# Patient Record
Sex: Female | Born: 1970 | Race: White | Hispanic: No | Marital: Single | Smoking: Current every day smoker
Health system: Southern US, Community
[De-identification: ages and names within clinical notes are randomized; demographics above are authoritative.]

## PROBLEM LIST (undated history)

## (undated) DIAGNOSIS — M47816 Spondylosis without myelopathy or radiculopathy, lumbar region: Secondary | ICD-10-CM

## (undated) DIAGNOSIS — G43909 Migraine, unspecified, not intractable, without status migrainosus: Secondary | ICD-10-CM

## (undated) DIAGNOSIS — Q403 Congenital malformation of stomach, unspecified: Secondary | ICD-10-CM

## (undated) DIAGNOSIS — J40 Bronchitis, not specified as acute or chronic: Secondary | ICD-10-CM

## (undated) DIAGNOSIS — K8050 Calculus of bile duct without cholangitis or cholecystitis without obstruction: Secondary | ICD-10-CM

## (undated) DIAGNOSIS — K760 Fatty (change of) liver, not elsewhere classified: Secondary | ICD-10-CM

## (undated) DIAGNOSIS — A09 Infectious gastroenteritis and colitis, unspecified: Secondary | ICD-10-CM

## (undated) DIAGNOSIS — I209 Angina pectoris, unspecified: Secondary | ICD-10-CM

## (undated) DIAGNOSIS — M4726 Other spondylosis with radiculopathy, lumbar region: Secondary | ICD-10-CM

## (undated) DIAGNOSIS — M51369 Other intervertebral disc degeneration, lumbar region without mention of lumbar back pain or lower extremity pain: Secondary | ICD-10-CM

## (undated) DIAGNOSIS — I4510 Unspecified right bundle-branch block: Secondary | ICD-10-CM

## (undated) DIAGNOSIS — Z720 Tobacco use: Secondary | ICD-10-CM

## (undated) DIAGNOSIS — M67431 Ganglion, right wrist: Secondary | ICD-10-CM

## (undated) DIAGNOSIS — I2510 Atherosclerotic heart disease of native coronary artery without angina pectoris: Secondary | ICD-10-CM

## (undated) DIAGNOSIS — R32 Unspecified urinary incontinence: Secondary | ICD-10-CM

## (undated) DIAGNOSIS — K588 Other irritable bowel syndrome: Secondary | ICD-10-CM

## (undated) DIAGNOSIS — M5136 Other intervertebral disc degeneration, lumbar region: Secondary | ICD-10-CM

## (undated) DIAGNOSIS — G5601 Carpal tunnel syndrome, right upper limb: Secondary | ICD-10-CM

## (undated) DIAGNOSIS — G8929 Other chronic pain: Secondary | ICD-10-CM

## (undated) DIAGNOSIS — K8020 Calculus of gallbladder without cholecystitis without obstruction: Secondary | ICD-10-CM

## (undated) DIAGNOSIS — S92902A Unspecified fracture of left foot, initial encounter for closed fracture: Secondary | ICD-10-CM

## (undated) DIAGNOSIS — J449 Chronic obstructive pulmonary disease, unspecified: Secondary | ICD-10-CM

## (undated) DIAGNOSIS — M1990 Unspecified osteoarthritis, unspecified site: Secondary | ICD-10-CM

## (undated) DIAGNOSIS — R1901 Right upper quadrant abdominal swelling, mass and lump: Secondary | ICD-10-CM

## (undated) DIAGNOSIS — J439 Emphysema, unspecified: Secondary | ICD-10-CM

## (undated) DIAGNOSIS — G5602 Carpal tunnel syndrome, left upper limb: Secondary | ICD-10-CM

## (undated) DIAGNOSIS — C189 Malignant neoplasm of colon, unspecified: Secondary | ICD-10-CM

## (undated) DIAGNOSIS — G2581 Restless legs syndrome: Secondary | ICD-10-CM

## (undated) DIAGNOSIS — K589 Irritable bowel syndrome without diarrhea: Secondary | ICD-10-CM

## (undated) DIAGNOSIS — R1310 Dysphagia, unspecified: Secondary | ICD-10-CM

## (undated) DIAGNOSIS — M5481 Occipital neuralgia: Secondary | ICD-10-CM

## (undated) DIAGNOSIS — E032 Hypothyroidism due to medicaments and other exogenous substances: Secondary | ICD-10-CM

## (undated) DIAGNOSIS — M533 Sacrococcygeal disorders, not elsewhere classified: Secondary | ICD-10-CM

## (undated) DIAGNOSIS — F419 Anxiety disorder, unspecified: Secondary | ICD-10-CM

## (undated) DIAGNOSIS — K219 Gastro-esophageal reflux disease without esophagitis: Secondary | ICD-10-CM

## (undated) DIAGNOSIS — E039 Hypothyroidism, unspecified: Secondary | ICD-10-CM

## (undated) DIAGNOSIS — Z79891 Long term (current) use of opiate analgesic: Secondary | ICD-10-CM

## (undated) DIAGNOSIS — C169 Malignant neoplasm of stomach, unspecified: Secondary | ICD-10-CM

## (undated) DIAGNOSIS — J45909 Unspecified asthma, uncomplicated: Secondary | ICD-10-CM

## (undated) DIAGNOSIS — T7840XA Allergy, unspecified, initial encounter: Secondary | ICD-10-CM

## (undated) DIAGNOSIS — M169 Osteoarthritis of hip, unspecified: Secondary | ICD-10-CM

## (undated) DIAGNOSIS — R0600 Dyspnea, unspecified: Secondary | ICD-10-CM

## (undated) DIAGNOSIS — M545 Low back pain, unspecified: Secondary | ICD-10-CM

## (undated) DIAGNOSIS — D126 Benign neoplasm of colon, unspecified: Secondary | ICD-10-CM

## (undated) DIAGNOSIS — I499 Cardiac arrhythmia, unspecified: Secondary | ICD-10-CM

## (undated) DIAGNOSIS — N281 Cyst of kidney, acquired: Secondary | ICD-10-CM

## (undated) DIAGNOSIS — E785 Hyperlipidemia, unspecified: Secondary | ICD-10-CM

## (undated) DIAGNOSIS — G609 Hereditary and idiopathic neuropathy, unspecified: Secondary | ICD-10-CM

## (undated) DIAGNOSIS — C73 Malignant neoplasm of thyroid gland: Secondary | ICD-10-CM

## (undated) HISTORY — PX: STOMACH SURGERY: SHX791

## (undated) HISTORY — DX: Fatty (change of) liver, not elsewhere classified: K76.0

## (undated) HISTORY — DX: Unspecified osteoarthritis, unspecified site: M19.90

## (undated) HISTORY — PX: TOTAL ABDOMINAL HYSTERECTOMY W/ BILATERAL SALPINGOOPHORECTOMY: SHX83

## (undated) HISTORY — DX: Other irritable bowel syndrome: K58.8

## (undated) HISTORY — DX: Carpal tunnel syndrome, left upper limb: G56.02

## (undated) HISTORY — DX: Chronic obstructive pulmonary disease, unspecified: J44.9

## (undated) HISTORY — DX: Unspecified fracture of left foot, initial encounter for closed fracture: S92.902A

## (undated) HISTORY — DX: Cyst of kidney, acquired: N28.1

## (undated) HISTORY — DX: Congenital malformation of stomach, unspecified: Q40.3

## (undated) HISTORY — DX: Hereditary and idiopathic neuropathy, unspecified: G60.9

## (undated) HISTORY — DX: Other spondylosis with radiculopathy, lumbar region: M47.26

## (undated) HISTORY — DX: Osteoarthritis of hip, unspecified: M16.9

## (undated) HISTORY — DX: Emphysema, unspecified: J43.9

## (undated) HISTORY — PX: BLADDER SURGERY: SHX569

## (undated) HISTORY — DX: Sacrococcygeal disorders, not elsewhere classified: M53.3

## (undated) HISTORY — DX: Allergy, unspecified, initial encounter: T78.40XA

## (undated) HISTORY — DX: Unspecified urinary incontinence: R32

## (undated) HISTORY — DX: Infectious gastroenteritis and colitis, unspecified: A09

## (undated) HISTORY — DX: Other chronic pain: G89.29

## (undated) HISTORY — PX: ABDOMINAL HYSTERECTOMY: SHX81

## (undated) HISTORY — DX: Bronchitis, not specified as acute or chronic: J40

## (undated) HISTORY — DX: Gastro-esophageal reflux disease without esophagitis: K21.9

## (undated) HISTORY — DX: Spondylosis without myelopathy or radiculopathy, lumbar region: M47.816

## (undated) HISTORY — DX: Migraine, unspecified, not intractable, without status migrainosus: G43.909

## (undated) HISTORY — DX: Unspecified asthma, uncomplicated: J45.909

## (undated) HISTORY — DX: Other intervertebral disc degeneration, lumbar region: M51.36

## (undated) HISTORY — DX: Malignant neoplasm of colon, unspecified: C18.9

## (undated) HISTORY — DX: Malignant neoplasm of stomach, unspecified: C16.9

## (undated) HISTORY — PX: APPENDECTOMY: SHX54

---

## 2002-02-17 ENCOUNTER — Encounter: Admission: RE | Admit: 2002-02-17 | Discharge: 2002-02-17 | Payer: Self-pay | Admitting: Internal Medicine

## 2002-02-17 ENCOUNTER — Encounter: Payer: Self-pay | Admitting: Internal Medicine

## 2004-03-10 ENCOUNTER — Emergency Department: Payer: Self-pay | Admitting: Emergency Medicine

## 2004-11-30 ENCOUNTER — Emergency Department: Payer: Self-pay | Admitting: Emergency Medicine

## 2005-09-29 ENCOUNTER — Ambulatory Visit: Payer: Self-pay | Admitting: Unknown Physician Specialty

## 2005-10-01 ENCOUNTER — Ambulatory Visit: Payer: Self-pay | Admitting: Infectious Diseases

## 2005-10-03 ENCOUNTER — Ambulatory Visit: Payer: Self-pay | Admitting: Infectious Diseases

## 2005-10-07 ENCOUNTER — Encounter: Admission: RE | Admit: 2005-10-07 | Discharge: 2005-10-07 | Payer: Self-pay | Admitting: *Deleted

## 2006-01-23 ENCOUNTER — Emergency Department: Payer: Self-pay

## 2006-04-27 ENCOUNTER — Ambulatory Visit: Payer: Self-pay | Admitting: Urology

## 2006-05-03 ENCOUNTER — Emergency Department: Payer: Self-pay | Admitting: Emergency Medicine

## 2006-09-19 ENCOUNTER — Emergency Department: Payer: Self-pay | Admitting: Emergency Medicine

## 2006-11-17 ENCOUNTER — Ambulatory Visit (HOSPITAL_BASED_OUTPATIENT_CLINIC_OR_DEPARTMENT_OTHER): Admission: RE | Admit: 2006-11-17 | Discharge: 2006-11-17 | Payer: Self-pay | Admitting: Urology

## 2007-03-22 ENCOUNTER — Emergency Department: Payer: Self-pay | Admitting: Unknown Physician Specialty

## 2007-06-14 ENCOUNTER — Emergency Department: Payer: Self-pay | Admitting: Emergency Medicine

## 2007-07-21 ENCOUNTER — Ambulatory Visit: Payer: Self-pay | Admitting: Gastroenterology

## 2007-12-17 ENCOUNTER — Other Ambulatory Visit: Payer: Self-pay

## 2007-12-17 ENCOUNTER — Emergency Department: Payer: Self-pay | Admitting: Emergency Medicine

## 2008-02-23 ENCOUNTER — Emergency Department: Payer: Self-pay | Admitting: Emergency Medicine

## 2008-02-23 ENCOUNTER — Other Ambulatory Visit: Payer: Self-pay

## 2008-03-13 ENCOUNTER — Ambulatory Visit: Payer: Self-pay | Admitting: Unknown Physician Specialty

## 2008-03-16 ENCOUNTER — Ambulatory Visit: Payer: Self-pay | Admitting: Unknown Physician Specialty

## 2008-03-22 ENCOUNTER — Ambulatory Visit: Payer: Self-pay | Admitting: Unknown Physician Specialty

## 2008-04-25 ENCOUNTER — Emergency Department: Payer: Self-pay | Admitting: Emergency Medicine

## 2008-06-11 ENCOUNTER — Emergency Department: Payer: Self-pay | Admitting: Emergency Medicine

## 2008-10-09 ENCOUNTER — Ambulatory Visit: Payer: Self-pay | Admitting: Pain Medicine

## 2008-10-16 ENCOUNTER — Ambulatory Visit: Payer: Self-pay | Admitting: Pain Medicine

## 2008-10-20 ENCOUNTER — Ambulatory Visit: Payer: Self-pay | Admitting: Unknown Physician Specialty

## 2008-10-26 ENCOUNTER — Ambulatory Visit: Payer: Self-pay | Admitting: Unknown Physician Specialty

## 2008-11-13 ENCOUNTER — Ambulatory Visit: Payer: Self-pay | Admitting: Pain Medicine

## 2008-11-16 ENCOUNTER — Ambulatory Visit: Payer: Self-pay | Admitting: Pain Medicine

## 2008-11-22 ENCOUNTER — Ambulatory Visit: Payer: Self-pay | Admitting: Pain Medicine

## 2008-12-12 ENCOUNTER — Ambulatory Visit: Payer: Self-pay | Admitting: Pain Medicine

## 2008-12-20 ENCOUNTER — Ambulatory Visit: Payer: Self-pay | Admitting: Pain Medicine

## 2009-01-31 ENCOUNTER — Ambulatory Visit: Payer: Self-pay | Admitting: Pain Medicine

## 2009-02-05 ENCOUNTER — Ambulatory Visit: Payer: Self-pay | Admitting: Pain Medicine

## 2009-02-17 ENCOUNTER — Emergency Department: Payer: Self-pay | Admitting: Emergency Medicine

## 2009-03-06 ENCOUNTER — Ambulatory Visit: Payer: Self-pay | Admitting: Pain Medicine

## 2009-04-30 ENCOUNTER — Ambulatory Visit: Payer: Self-pay | Admitting: Pain Medicine

## 2009-05-07 ENCOUNTER — Ambulatory Visit: Payer: Self-pay | Admitting: Pain Medicine

## 2009-05-23 ENCOUNTER — Ambulatory Visit: Payer: Self-pay | Admitting: Pain Medicine

## 2009-06-06 ENCOUNTER — Ambulatory Visit: Payer: Self-pay | Admitting: Pain Medicine

## 2009-06-26 ENCOUNTER — Ambulatory Visit: Payer: Self-pay | Admitting: Pain Medicine

## 2009-07-04 ENCOUNTER — Ambulatory Visit: Payer: Self-pay | Admitting: Pain Medicine

## 2009-07-26 ENCOUNTER — Ambulatory Visit: Payer: Self-pay | Admitting: Pain Medicine

## 2009-08-23 ENCOUNTER — Ambulatory Visit: Payer: Self-pay | Admitting: Pain Medicine

## 2009-09-03 ENCOUNTER — Ambulatory Visit: Payer: Self-pay | Admitting: Pain Medicine

## 2009-09-20 ENCOUNTER — Ambulatory Visit: Payer: Self-pay | Admitting: Pain Medicine

## 2009-10-03 ENCOUNTER — Ambulatory Visit: Payer: Self-pay | Admitting: Pain Medicine

## 2009-10-23 ENCOUNTER — Ambulatory Visit: Payer: Self-pay | Admitting: Pain Medicine

## 2009-11-19 ENCOUNTER — Ambulatory Visit: Payer: Self-pay | Admitting: Pain Medicine

## 2010-01-09 ENCOUNTER — Ambulatory Visit: Payer: Self-pay | Admitting: Pain Medicine

## 2010-01-14 ENCOUNTER — Ambulatory Visit: Payer: Self-pay | Admitting: Pain Medicine

## 2010-02-01 ENCOUNTER — Ambulatory Visit: Payer: Self-pay | Admitting: Unknown Physician Specialty

## 2010-02-07 ENCOUNTER — Ambulatory Visit: Payer: Self-pay | Admitting: Pain Medicine

## 2010-03-04 ENCOUNTER — Ambulatory Visit: Payer: Self-pay | Admitting: Pain Medicine

## 2010-04-08 ENCOUNTER — Ambulatory Visit: Payer: Self-pay | Admitting: Pain Medicine

## 2010-04-22 ENCOUNTER — Ambulatory Visit: Payer: Self-pay | Admitting: Pain Medicine

## 2010-05-05 ENCOUNTER — Emergency Department: Payer: Self-pay | Admitting: Emergency Medicine

## 2010-05-07 ENCOUNTER — Ambulatory Visit: Payer: Self-pay | Admitting: Pain Medicine

## 2010-06-04 ENCOUNTER — Ambulatory Visit: Payer: Self-pay | Admitting: Pain Medicine

## 2010-07-08 ENCOUNTER — Ambulatory Visit: Payer: Self-pay | Admitting: Pain Medicine

## 2010-08-06 ENCOUNTER — Ambulatory Visit: Payer: Self-pay | Admitting: Pain Medicine

## 2010-08-14 ENCOUNTER — Ambulatory Visit: Payer: Self-pay | Admitting: Pain Medicine

## 2010-09-03 ENCOUNTER — Ambulatory Visit: Payer: Self-pay | Admitting: Pain Medicine

## 2010-09-07 ENCOUNTER — Emergency Department: Payer: Self-pay | Admitting: Emergency Medicine

## 2010-10-03 ENCOUNTER — Ambulatory Visit: Payer: Self-pay | Admitting: Pain Medicine

## 2010-10-21 ENCOUNTER — Ambulatory Visit: Payer: Self-pay | Admitting: Pain Medicine

## 2010-10-22 NOTE — Op Note (Signed)
Janet Murray, Janet Murray                 ACCOUNT NO.:  [id number]   MEDICAL RECORD NO.:  [id number]          PATIENT TYPE:  AMB   LOCATION:  [facility]                         FACILITY:  [facility]   PHYSICIAN:  Martina Sinner, MD DATE OF BIRTH:  1970/07/14   DATE OF PROCEDURE:  DATE OF DISCHARGE:                               OPERATIVE REPORT   Audio too short to transcribe (less than 5 seconds)           ______________________________  Martina Sinner, MD     SAM/MEDQ  D:  11/17/2006  T:  11/17/2006  Job:  [id number]

## 2010-10-22 NOTE — Op Note (Signed)
NAMEERTHA, NABOR                 ACCOUNT NO.:  [id number]   MEDICAL RECORD NO.:  [id number]          PATIENT TYPE:  AMB   LOCATION:  [facility]                         FACILITY:  [facility]   PHYSICIAN:  Martina Sinner, MD DATE OF BIRTH:  1970-11-07   DATE OF PROCEDURE:  11/17/2006  DATE OF DISCHARGE:                               OPERATIVE REPORT   PREOPERATIVE DIAGNOSIS:  Pelvic pain.   POSTOPERATIVE DIAGNOSIS:  Pelvic pain.   OPERATION/PROCEDURE:  Cystoscopy, bladder hydrodistention and bladder  instillation therapy.   INDICATIONS:  Mrs. Cotta  has mixed stress urge incontinence.  She has  suprapubic pressure.  She has frequency and right lower quadrant pain.  She had urodynamics that demonstrated a lot of bladder overactivity and  a mild outlet abnormality.  She voids frequently because of pressure  that is relieved she voids.  She had dysfunctional voiding and pain on  urodynamics.   Today she underwent cystoscopy, hydrodistention.  A 21-French scope was  used for examination.  Initially the bladder mucosa and trigone were  normal.  There is no stitch, foreign body, or carcinoma.  I  hydrodistended her to 900 mL on two occasions.  I emptied her bladder,  looked twice.  She had a few glomerulations at 5 and 7 o'clock, but they  were very minimal.  Overall I thought the test was negative or equivocal  to support the diagnosis of interstitial cystitis.   My gut feeling is that Toniann Fail likely does have interstitial cystitis.  I  may do a potassium sensitivity test in the future.  I likely will start  her on bladder instillation therapy to see if this helps her symptoms.   She failed Enablex and Vesicare.  She could not travel to see Lenna Gilford.  I  will talk to her about this since I think Lenna Gilford  could help her a lot.  I am concerned if she ever had a sling for her a mild stress  incontinence that her pressure and frequency and urge incontinence and  bed waiting will continue.  I think  we have to treat her pain syndrome  as well as overactive bladder.  She may be a good candidate for an  InterStim otitis media the future.   I will ask her about her stress incontinence again and its severity when  she comes back to clinic.  Her leak point pressure was very high during  her urodynamics.           ______________________________  Martina Sinner, MD  Electronically Signed     SAM/MEDQ  D:  11/17/2006  T:  11/18/2006  Job:  [id number]

## 2010-12-03 ENCOUNTER — Emergency Department: Payer: Self-pay | Admitting: Emergency Medicine

## 2011-02-10 ENCOUNTER — Emergency Department: Payer: Self-pay | Admitting: Emergency Medicine

## 2011-03-27 LAB — POCT PREGNANCY, URINE: Preg Test, Ur: NEGATIVE

## 2011-05-08 ENCOUNTER — Ambulatory Visit: Payer: Self-pay | Admitting: Physician Assistant

## 2011-06-07 ENCOUNTER — Emergency Department: Payer: Self-pay | Admitting: Emergency Medicine

## 2011-06-30 ENCOUNTER — Emergency Department: Payer: Self-pay | Admitting: *Deleted

## 2011-08-05 ENCOUNTER — Ambulatory Visit: Payer: Self-pay | Admitting: Physician Assistant

## 2011-08-28 ENCOUNTER — Ambulatory Visit: Payer: Self-pay | Admitting: Pain Medicine

## 2011-09-03 ENCOUNTER — Emergency Department: Payer: Self-pay | Admitting: Emergency Medicine

## 2011-09-03 LAB — CBC WITH DIFFERENTIAL/PLATELET
Basophil #: 0 10*3/uL (ref 0.0–0.1)
Lymphocyte #: 1 10*3/uL (ref 1.0–3.6)
MCH: 33.4 pg (ref 26.0–34.0)
MCHC: 34 g/dL (ref 32.0–36.0)
MCV: 98 fL (ref 80–100)
Neutrophil #: 6 10*3/uL (ref 1.4–6.5)
Platelet: 188 10*3/uL (ref 150–440)

## 2011-09-03 LAB — COMPREHENSIVE METABOLIC PANEL
Alkaline Phosphatase: 81 U/L (ref 50–136)
Bilirubin,Total: 0.3 mg/dL (ref 0.2–1.0)
Calcium, Total: 8.6 mg/dL (ref 8.5–10.1)
Co2: 24 mmol/L (ref 21–32)
EGFR (African American): >60
Glucose: 107 mg/dL — ABNORMAL HIGH (ref 65–99)
SGOT(AST): 17 U/L (ref 15–37)
Sodium: 140 mmol/L (ref 136–145)

## 2011-09-03 LAB — URINALYSIS, COMPLETE
Bacteria: NONE SEEN
Bilirubin,UR: NEGATIVE
Glucose,UR: NEGATIVE mg/dL (ref 0–75)
Ketone: NEGATIVE
Leukocyte Esterase: NEGATIVE
RBC,UR: 11 /HPF (ref 0–5)

## 2011-09-08 ENCOUNTER — Ambulatory Visit: Payer: Self-pay | Admitting: Pain Medicine

## 2011-09-08 ENCOUNTER — Emergency Department: Payer: Self-pay | Admitting: Unknown Physician Specialty

## 2011-09-15 ENCOUNTER — Ambulatory Visit: Payer: Self-pay | Admitting: Pain Medicine

## 2011-10-14 ENCOUNTER — Ambulatory Visit: Payer: Self-pay | Admitting: Pain Medicine

## 2011-10-22 ENCOUNTER — Ambulatory Visit: Payer: Self-pay | Admitting: Pain Medicine

## 2011-11-11 ENCOUNTER — Ambulatory Visit: Payer: Self-pay | Admitting: Pain Medicine

## 2011-11-24 ENCOUNTER — Ambulatory Visit: Payer: Self-pay | Admitting: Pain Medicine

## 2011-12-04 ENCOUNTER — Ambulatory Visit: Payer: Self-pay | Admitting: Pain Medicine

## 2012-01-12 ENCOUNTER — Ambulatory Visit: Payer: Self-pay | Admitting: Pain Medicine

## 2012-01-21 ENCOUNTER — Ambulatory Visit: Payer: Self-pay | Admitting: Pain Medicine

## 2012-02-12 ENCOUNTER — Ambulatory Visit: Payer: Self-pay | Admitting: Pain Medicine

## 2012-02-25 ENCOUNTER — Ambulatory Visit: Payer: Self-pay | Admitting: Pain Medicine

## 2012-04-08 ENCOUNTER — Ambulatory Visit: Payer: Self-pay | Admitting: Pain Medicine

## 2012-04-12 ENCOUNTER — Ambulatory Visit: Payer: Self-pay | Admitting: Pain Medicine

## 2012-04-21 ENCOUNTER — Emergency Department: Payer: Self-pay | Admitting: Emergency Medicine

## 2012-05-11 ENCOUNTER — Ambulatory Visit: Payer: Self-pay | Admitting: Pain Medicine

## 2012-06-07 ENCOUNTER — Ambulatory Visit: Payer: Self-pay | Admitting: Pain Medicine

## 2012-07-19 ENCOUNTER — Ambulatory Visit: Payer: Self-pay | Admitting: Pain Medicine

## 2012-09-03 ENCOUNTER — Ambulatory Visit: Payer: Self-pay | Admitting: Internal Medicine

## 2012-09-06 ENCOUNTER — Ambulatory Visit: Payer: Self-pay | Admitting: Pain Medicine

## 2012-10-05 ENCOUNTER — Ambulatory Visit: Payer: Self-pay | Admitting: Pain Medicine

## 2012-10-20 ENCOUNTER — Ambulatory Visit: Payer: Self-pay | Admitting: Pain Medicine

## 2012-11-04 ENCOUNTER — Ambulatory Visit: Payer: Self-pay | Admitting: Pain Medicine

## 2012-11-17 ENCOUNTER — Ambulatory Visit: Payer: Self-pay | Admitting: Pain Medicine

## 2012-11-19 ENCOUNTER — Ambulatory Visit: Payer: Self-pay | Admitting: Pain Medicine

## 2012-11-30 ENCOUNTER — Ambulatory Visit: Payer: Self-pay | Admitting: Pain Medicine

## 2012-12-08 ENCOUNTER — Ambulatory Visit: Payer: Self-pay | Admitting: Pain Medicine

## 2012-12-30 ENCOUNTER — Ambulatory Visit: Payer: Self-pay | Admitting: Pain Medicine

## 2013-02-28 ENCOUNTER — Ambulatory Visit: Payer: Self-pay | Admitting: Pain Medicine

## 2013-03-29 ENCOUNTER — Ambulatory Visit: Payer: Self-pay | Admitting: Pain Medicine

## 2013-03-30 ENCOUNTER — Ambulatory Visit: Payer: Self-pay | Admitting: Pain Medicine

## 2013-04-26 ENCOUNTER — Ambulatory Visit: Payer: Self-pay | Admitting: Pain Medicine

## 2013-05-26 ENCOUNTER — Ambulatory Visit: Payer: Self-pay | Admitting: Pain Medicine

## 2013-06-15 ENCOUNTER — Ambulatory Visit: Payer: Self-pay | Admitting: Pain Medicine

## 2013-06-28 ENCOUNTER — Ambulatory Visit: Payer: Self-pay | Admitting: Pain Medicine

## 2013-07-04 ENCOUNTER — Ambulatory Visit: Payer: Self-pay | Admitting: Pain Medicine

## 2013-08-03 ENCOUNTER — Ambulatory Visit: Payer: Self-pay | Admitting: Pain Medicine

## 2013-09-01 ENCOUNTER — Ambulatory Visit: Payer: Self-pay | Admitting: Pain Medicine

## 2013-09-27 ENCOUNTER — Ambulatory Visit: Payer: Self-pay | Admitting: Pain Medicine

## 2013-09-28 ENCOUNTER — Ambulatory Visit: Payer: Self-pay | Admitting: Pain Medicine

## 2013-10-06 ENCOUNTER — Ambulatory Visit: Payer: Self-pay | Admitting: Pain Medicine

## 2013-10-12 ENCOUNTER — Ambulatory Visit: Payer: Self-pay | Admitting: Pain Medicine

## 2013-10-29 ENCOUNTER — Emergency Department: Payer: Self-pay | Admitting: Emergency Medicine

## 2013-10-29 LAB — URINALYSIS, COMPLETE
BILIRUBIN, UR: NEGATIVE
Glucose,UR: NEGATIVE mg/dL (ref 0–75)
Hyaline Cast: 2
Nitrite: NEGATIVE
Ph: 5 (ref 4.5–8.0)
Protein: 30
Specific Gravity: 1.026 (ref 1.003–1.030)
Squamous Epithelial: 8

## 2013-10-29 LAB — CBC WITH DIFFERENTIAL/PLATELET
BASOS PCT: 0.7 %
Basophil #: 0.1 10*3/uL (ref 0.0–0.1)
EOS ABS: 0.2 10*3/uL (ref 0.0–0.7)
Eosinophil %: 2 %
HCT: 43.9 % (ref 35.0–47.0)
HGB: 14.3 g/dL (ref 12.0–16.0)
Lymphocyte #: 2.9 10*3/uL (ref 1.0–3.6)
Lymphocyte %: 29.6 %
MCH: 32.2 pg (ref 26.0–34.0)
MCHC: 32.5 g/dL (ref 32.0–36.0)
MCV: 99 fL (ref 80–100)
MONOS PCT: 7.6 %
Monocyte #: 0.7 x10 3/mm (ref 0.2–0.9)
NEUTROS ABS: 5.8 10*3/uL (ref 1.4–6.5)
Neutrophil %: 60.1 %
PLATELETS: 173 10*3/uL (ref 150–440)
RBC: 4.42 10*6/uL (ref 3.80–5.20)
RDW: 13 % (ref 11.5–14.5)
WBC: 9.6 10*3/uL (ref 3.6–11.0)

## 2013-10-29 LAB — COMPREHENSIVE METABOLIC PANEL
ALBUMIN: 4.1 g/dL (ref 3.4–5.0)
ALK PHOS: 87 U/L
Anion Gap: 4 — ABNORMAL LOW (ref 7–16)
BILIRUBIN TOTAL: 0.3 mg/dL (ref 0.2–1.0)
BUN: 7 mg/dL (ref 7–18)
CALCIUM: 9.2 mg/dL (ref 8.5–10.1)
CO2: 28 mmol/L (ref 21–32)
Chloride: 106 mmol/L (ref 98–107)
Creatinine: 0.66 mg/dL (ref 0.60–1.30)
EGFR (African American): >60
EGFR (Non-African Amer.): >60
Glucose: 105 mg/dL — ABNORMAL HIGH (ref 65–99)
OSMOLALITY: 274 (ref 275–301)
Potassium: 3.2 mmol/L — ABNORMAL LOW (ref 3.5–5.1)
SGOT(AST): 17 U/L (ref 15–37)
SGPT (ALT): 13 U/L (ref 12–78)
Sodium: 138 mmol/L (ref 136–145)
Total Protein: 7.2 g/dL (ref 6.4–8.2)

## 2013-10-29 LAB — LIPASE, BLOOD: Lipase: 77 U/L (ref 73–393)

## 2013-11-01 ENCOUNTER — Ambulatory Visit: Payer: Self-pay | Admitting: Pain Medicine

## 2013-11-01 LAB — URINE CULTURE

## 2013-11-14 ENCOUNTER — Ambulatory Visit: Payer: Self-pay | Admitting: Pain Medicine

## 2013-11-29 ENCOUNTER — Ambulatory Visit: Payer: Self-pay | Admitting: Pain Medicine

## 2013-12-12 ENCOUNTER — Ambulatory Visit: Payer: Self-pay | Admitting: Pain Medicine

## 2013-12-16 ENCOUNTER — Emergency Department: Payer: Self-pay | Admitting: Emergency Medicine

## 2013-12-16 LAB — CBC
HCT: 40.3 % (ref 35.0–47.0)
HGB: 13.5 g/dL (ref 12.0–16.0)
MCH: 33.8 pg (ref 26.0–34.0)
MCHC: 33.4 g/dL (ref 32.0–36.0)
MCV: 101 fL — ABNORMAL HIGH (ref 80–100)
Platelet: 226 10*3/uL (ref 150–440)
RBC: 3.99 10*6/uL (ref 3.80–5.20)
RDW: 13.6 % (ref 11.5–14.5)
WBC: 10.3 10*3/uL (ref 3.6–11.0)

## 2013-12-16 LAB — BASIC METABOLIC PANEL
ANION GAP: 7 (ref 7–16)
BUN: 11 mg/dL (ref 7–18)
CHLORIDE: 107 mmol/L (ref 98–107)
CO2: 24 mmol/L (ref 21–32)
Calcium, Total: 8.7 mg/dL (ref 8.5–10.1)
Creatinine: 0.72 mg/dL (ref 0.60–1.30)
GLUCOSE: 110 mg/dL — AB (ref 65–99)
OSMOLALITY: 276 (ref 275–301)
POTASSIUM: 3.8 mmol/L (ref 3.5–5.1)
Sodium: 138 mmol/L (ref 136–145)

## 2013-12-16 LAB — PRO B NATRIURETIC PEPTIDE: B-Type Natriuretic Peptide: 20 pg/mL (ref 0–125)

## 2013-12-16 LAB — TROPONIN I: Troponin-I: <0.02 ng/mL

## 2014-01-10 ENCOUNTER — Ambulatory Visit: Payer: Self-pay | Admitting: Pain Medicine

## 2014-01-16 ENCOUNTER — Ambulatory Visit: Payer: Self-pay | Admitting: Pain Medicine

## 2014-02-09 ENCOUNTER — Ambulatory Visit: Payer: Self-pay | Admitting: Pain Medicine

## 2014-02-27 ENCOUNTER — Ambulatory Visit: Payer: Self-pay | Admitting: Pain Medicine

## 2014-03-09 ENCOUNTER — Ambulatory Visit: Payer: Self-pay | Admitting: Pain Medicine

## 2014-03-15 ENCOUNTER — Emergency Department: Payer: Self-pay | Admitting: Emergency Medicine

## 2014-03-15 LAB — CBC
HCT: 41.3 % (ref 35.0–47.0)
HGB: 13.4 g/dL (ref 12.0–16.0)
MCH: 33.5 pg (ref 26.0–34.0)
MCHC: 32.6 g/dL (ref 32.0–36.0)
MCV: 103 fL — AB (ref 80–100)
Platelet: 254 10*3/uL (ref 150–440)
RBC: 4.01 10*6/uL (ref 3.80–5.20)
RDW: 13 % (ref 11.5–14.5)
WBC: 6.1 10*3/uL (ref 3.6–11.0)

## 2014-03-15 LAB — BASIC METABOLIC PANEL
Anion Gap: 5 — ABNORMAL LOW (ref 7–16)
BUN: 6 mg/dL — ABNORMAL LOW (ref 7–18)
CALCIUM: 8.2 mg/dL — AB (ref 8.5–10.1)
CO2: 27 mmol/L (ref 21–32)
CREATININE: 0.52 mg/dL — AB (ref 0.60–1.30)
Chloride: 109 mmol/L — ABNORMAL HIGH (ref 98–107)
EGFR (Non-African Amer.): >60
Glucose: 88 mg/dL (ref 65–99)
OSMOLALITY: 278 (ref 275–301)
Potassium: 3.5 mmol/L (ref 3.5–5.1)
Sodium: 141 mmol/L (ref 136–145)

## 2014-03-15 LAB — TROPONIN I: Troponin-I: <0.02 ng/mL

## 2014-05-15 ENCOUNTER — Ambulatory Visit: Payer: Self-pay | Admitting: Internal Medicine

## 2014-05-22 ENCOUNTER — Ambulatory Visit: Payer: Self-pay | Admitting: Pain Medicine

## 2014-05-29 ENCOUNTER — Emergency Department: Payer: Self-pay | Admitting: Emergency Medicine

## 2014-06-20 ENCOUNTER — Ambulatory Visit: Payer: Self-pay | Admitting: Pain Medicine

## 2014-06-26 ENCOUNTER — Ambulatory Visit: Payer: Self-pay | Admitting: Pain Medicine

## 2014-07-18 ENCOUNTER — Emergency Department: Payer: Self-pay | Admitting: Internal Medicine

## 2014-07-25 ENCOUNTER — Ambulatory Visit: Payer: Self-pay | Admitting: Pain Medicine

## 2014-07-27 ENCOUNTER — Ambulatory Visit: Payer: Self-pay | Admitting: Pain Medicine

## 2014-07-31 ENCOUNTER — Emergency Department: Payer: Self-pay | Admitting: Emergency Medicine

## 2014-08-22 ENCOUNTER — Ambulatory Visit: Payer: Self-pay | Admitting: Pain Medicine

## 2014-09-04 ENCOUNTER — Ambulatory Visit: Payer: Self-pay | Admitting: Pain Medicine

## 2014-09-19 ENCOUNTER — Ambulatory Visit
Admit: 2014-09-19 | Disposition: A | Discharge status: Home / Self Care | Payer: Self-pay | Attending: Pain Medicine | Admitting: Pain Medicine

## 2014-09-19 ENCOUNTER — Ambulatory Visit: Admit: 2014-09-19 | Disposition: A | Discharge status: Home / Self Care | Payer: Self-pay | Admitting: Pain Medicine

## 2014-10-23 ENCOUNTER — Telehealth: Payer: Self-pay | Admitting: Pain Medicine

## 2014-10-23 NOTE — Telephone Encounter (Signed)
[phone] call back to this number / last visit 09-19-14 order put in for coollief rf / pt not sched for med refill/ please advise.

## 2014-10-23 NOTE — Telephone Encounter (Signed)
Medication refills to last until 10/21/2014. Patient is due for medication refill. Dr. Primus Bravo agreed to see patient for med refill on 10/30/14 or 11/01/2014 at 7 am

## 2014-10-25 ENCOUNTER — Encounter (INDEPENDENT_AMBULATORY_CARE_PROVIDER_SITE_OTHER): Payer: Self-pay

## 2014-10-25 ENCOUNTER — Encounter: Payer: Self-pay | Admitting: Pain Medicine

## 2014-10-25 ENCOUNTER — Other Ambulatory Visit: Payer: Self-pay | Admitting: Pain Medicine

## 2014-10-25 ENCOUNTER — Ambulatory Visit: Payer: Medicare Other | Attending: Pain Medicine | Admitting: Pain Medicine

## 2014-10-25 VITALS — BP 101/70 | HR 74 | Temp 98.2°F | Resp 16 | Ht 61.0 in | Wt 168.0 lb

## 2014-10-25 DIAGNOSIS — M47816 Spondylosis without myelopathy or radiculopathy, lumbar region: Secondary | ICD-10-CM

## 2014-10-25 DIAGNOSIS — M51369 Other intervertebral disc degeneration, lumbar region without mention of lumbar back pain or lower extremity pain: Secondary | ICD-10-CM

## 2014-10-25 DIAGNOSIS — M545 Low back pain, unspecified: Secondary | ICD-10-CM | POA: Insufficient documentation

## 2014-10-25 DIAGNOSIS — M533 Sacrococcygeal disorders, not elsewhere classified: Secondary | ICD-10-CM | POA: Insufficient documentation

## 2014-10-25 DIAGNOSIS — G5602 Carpal tunnel syndrome, left upper limb: Secondary | ICD-10-CM

## 2014-10-25 DIAGNOSIS — G2581 Restless legs syndrome: Secondary | ICD-10-CM | POA: Insufficient documentation

## 2014-10-25 DIAGNOSIS — M79604 Pain in right leg: Secondary | ICD-10-CM | POA: Diagnosis present

## 2014-10-25 DIAGNOSIS — M5136 Other intervertebral disc degeneration, lumbar region: Secondary | ICD-10-CM

## 2014-10-25 DIAGNOSIS — M79605 Pain in left leg: Secondary | ICD-10-CM | POA: Insufficient documentation

## 2014-10-25 DIAGNOSIS — M4726 Other spondylosis with radiculopathy, lumbar region: Secondary | ICD-10-CM

## 2014-10-25 DIAGNOSIS — M5481 Occipital neuralgia: Secondary | ICD-10-CM

## 2014-10-25 HISTORY — DX: Sacrococcygeal disorders, not elsewhere classified: M53.3

## 2014-10-25 HISTORY — DX: Other spondylosis with radiculopathy, lumbar region: M47.26

## 2014-10-25 HISTORY — DX: Carpal tunnel syndrome, left upper limb: G56.02

## 2014-10-25 HISTORY — DX: Other intervertebral disc degeneration, lumbar region: M51.36

## 2014-10-25 HISTORY — DX: Spondylosis without myelopathy or radiculopathy, lumbar region: M47.816

## 2014-10-25 HISTORY — DX: Other intervertebral disc degeneration, lumbar region without mention of lumbar back pain or lower extremity pain: M51.369

## 2014-10-25 MED ORDER — OXYCODONE-ACETAMINOPHEN 5-325 MG PO TABS: ORAL_TABLET | ORAL | Status: DC

## 2014-10-25 MED ORDER — TIZANIDINE HCL 2 MG PO CAPS: ORAL_CAPSULE | ORAL | Status: DC

## 2014-10-25 NOTE — Progress Notes (Signed)
   Subjective:    Patient ID: Janet Murray, female    DOB: [birth year], 44 y.o.   MRN: [id number]  HPI    Review of Systems     Objective:   Physical Exam        Assessment & Plan:

## 2014-10-25 NOTE — Progress Notes (Signed)
Ambulatory 1345  Teach back times 3  Discharged at 1345pm

## 2014-10-25 NOTE — Patient Instructions (Addendum)
Continue present medications.  Lumbar sympathetic  Block on  11/08/14  F/U PCP for evaliation of  BP and general medical  condition.  F/U surgical evaluation.. We will have secretary called Dr. Jerene Dilling office to request that he schedule neurosurgical evaluation of your lower back and lower extremity pain  F/U neurological evaluation.  May consider radiofrequency rhizolysis or intraspinal procedures pending response to present treatment and F/U evaluation.    GENERAL RISKS AND COMPLICATIONS  What are the risk, side effects and possible complications? Generally speaking, most procedures are safe.  However, with any procedure there are risks, side effects, and the possibility of complications.  The risks and complications are dependent upon the sites that are lesioned, or the type of nerve block to be performed.  The closer the procedure is to the spine, the more serious the risks are.  Great care is taken when placing the radio frequency needles, block needles or lesioning probes, but sometimes complications can occur. 1. Infection: Any time there is an injection through the skin, there is a risk of infection.  This is why sterile conditions are used for these blocks.  There are four possible types of infection. 1. Localized skin infection. 2. Central Nervous System Infection-This can be in the form of Meningitis, which can be deadly. 3. Epidural Infections-This can be in the form of an epidural abscess, which can cause pressure inside of the spine, causing compression of the spinal cord with subsequent paralysis. This would require an emergency surgery to decompress, and there are no guarantees that the patient would recover from the paralysis. 4. Discitis-This is an infection of the intervertebral discs.  It occurs in about 1% of discography procedures.  It is difficult to treat and it may lead to surgery.        2. Pain: the needles have to go through skin and soft tissues, will cause  soreness.       3. Damage to internal structures:  The nerves to be lesioned may be near blood vessels or    other nerves which can be potentially damaged.       4. Bleeding: Bleeding is more common if the patient is taking blood thinners such as  aspirin, Coumadin, Ticiid, Plavix, etc., or if he/she have some genetic predisposition  such as hemophilia. Bleeding into the spinal canal can cause compression of the spinal  cord with subsequent paralysis.  This would require an emergency surgery to  decompress and there are no guarantees that the patient would recover from the  paralysis.       5. Pneumothorax:  Puncturing of a lung is a possibility, every time a needle is introduced in  the area of the chest or upper back.  Pneumothorax refers to free air around the  collapsed lung(s), inside of the thoracic cavity (chest cavity).  Another two possible  complications related to a similar event would include: Hemothorax and Chylothorax.   These are variations of the Pneumothorax, where instead of air around the collapsed  lung(s), you may have blood or chyle, respectively.       6. Spinal headaches: They may occur with any procedures in the area of the spine.       7. Persistent CSF (Cerebro-Spinal Fluid) leakage: This is a rare problem, but may occur  with prolonged intrathecal or epidural catheters either due to the formation of a fistulous  track or a dural tear.       8. Nerve damage: By working  so close to the spinal cord, there is always a possibility of  nerve damage, which could be as serious as a permanent spinal cord injury with  paralysis.       9. Death:  Although rare, severe deadly allergic reactions known as "Anaphylactic  reaction" can occur to any of the medications used.      10. Worsening of the symptoms:  We can always make thing worse.  What are the chances of something like this happening? Chances of any of this occuring are extremely low.  By statistics, you have more of a chance of  getting killed in a motor vehicle accident: while driving to the hospital than any of the above occurring .  Nevertheless, you should be aware that they are possibilities.  In general, it is similar to taking a shower.  Everybody knows that you can slip, hit your head and get killed.  Does that mean that you should not shower again?  Nevertheless always keep in mind that statistics do not mean anything if you happen to be on the wrong side of them.  Even if a procedure has a 1 (one) in a 1,000,000 (million) chance of going wrong, it you happen to be that one..Also, keep in mind that by statistics, you have more of a chance of having something go wrong when taking medications.  Who should not have this procedure? If you are on a blood thinning medication (e.g. Coumadin, Plavix, see list of "Blood Thinners"), or if you have an active infection going on, you should not have the procedure.  If you are taking any blood thinners, please inform your physician.  How should I prepare for this procedure?  Do not eat or drink anything at least six hours prior to the procedure.  Bring a driver with you .  It cannot be a taxi.  Come accompanied by an adult that can drive you back, and that is strong enough to help you if your legs get weak or numb from the local anesthetic.  Take all of your medicines the morning of the procedure with just enough water to swallow them.  If you have diabetes, make sure that you are scheduled to have your procedure done first thing in the morning, whenever possible.  If you have diabetes, take only half of your insulin dose and notify our nurse that you have done so as soon as you arrive at the clinic.  If you are diabetic, but only take blood sugar pills (oral hypoglycemic), then do not take them on the morning of your procedure.  You may take them after you have had the procedure.  Do not take aspirin or any aspirin-containing medications, at least eleven (11) days prior to  the procedure.  They may prolong bleeding.  Wear loose fitting clothing that may be easy to take off and that you would not mind if it got stained with Betadine or blood.  Do not wear any jewelry or perfume  Remove any nail coloring.  It will interfere with some of our monitoring equipment.  NOTE: Remember that this is not meant to be interpreted as a complete list of all possible complications.  Unforeseen problems may occur.  BLOOD THINNERS The following drugs contain aspirin or other products, which can cause increased bleeding during surgery and should not be taken for 2 weeks prior to and 1 week after surgery.  If you should need take something for relief of minor pain, you may take acetaminophen  which is found in Tylenol,m Datril, Anacin-3 and Panadol. It is not blood thinner. The products listed below are.  Do not take any of the products listed below in addition to any listed on your instruction sheet.  A.P.C or A.P.C with Codeine Codeine Phosphate Capsules #3 Ibuprofen Ridaura  ABC compound Congesprin Imuran rimadil  Advil Cope Indocin Robaxisal  Alka-Seltzer Effervescent Pain Reliever and Antacid Coricidin or Coricidin-D  Indomethacin Rufen  Alka-Seltzer plus Cold Medicine Cosprin Ketoprofen S-A-C Tablets  Anacin Analgesic Tablets or Capsules Coumadin Korlgesic Salflex  Anacin Extra Strength Analgesic tablets or capsules CP-2 Tablets Lanoril Salicylate  Anaprox Cuprimine Capsules Levenox Salocol  Anexsia-D Dalteparin Magan Salsalate  Anodynos Darvon compound Magnesium Salicylate Sine-off  Ansaid Dasin Capsules Magsal Sodium Salicylate  Anturane Depen Capsules Marnal Soma  APF Arthritis pain formula Dewitt's Pills Measurin Stanback  Argesic Dia-Gesic Meclofenamic Sulfinpyrazone  Arthritis Bayer Timed Release Aspirin Diclofenac Meclomen Sulindac  Arthritis pain formula Anacin Dicumarol Medipren Supac  Analgesic (Safety coated) Arthralgen Diffunasal Mefanamic Suprofen  Arthritis  Strength Bufferin Dihydrocodeine Mepro Compound Suprol  Arthropan liquid Dopirydamole Methcarbomol with Aspirin Synalgos  ASA tablets/Enseals Disalcid Micrainin Tagament  Ascriptin Doan's Midol Talwin  Ascriptin A/D Dolene Mobidin Tanderil  Ascriptin Extra Strength Dolobid Moblgesic Ticlid  Ascriptin with Codeine Doloprin or Doloprin with Codeine Momentum Tolectin  Asperbuf Duoprin Mono-gesic Trendar  Aspergum Duradyne Motrin or Motrin IB Triminicin  Aspirin plain, buffered or enteric coated Durasal Myochrisine Trigesic  Aspirin Suppositories Easprin Nalfon Trillsate  Aspirin with Codeine Ecotrin Regular or Extra Strength Naprosyn Uracel  Atromid-S Efficin Naproxen Ursinus  Auranofin Capsules Elmiron Neocylate Vanquish  Axotal Emagrin Norgesic Verin  Azathioprine Empirin or Empirin with Codeine Normiflo Vitamin E  Azolid Emprazil Nuprin Voltaren  Bayer Aspirin plain, buffered or children's or timed BC Tablets or powders Encaprin Orgaran Warfarin Sodium  Buff-a-Comp Enoxaparin Orudis Zorpin  Buff-a-Comp with Codeine Equegesic Os-Cal-Gesic   Buffaprin Excedrin plain, buffered or Extra Strength Oxalid   Bufferin Arthritis Strength Feldene Oxphenbutazone   Bufferin plain or Extra Strength Feldene Capsules Oxycodone with Aspirin   Bufferin with Codeine Fenoprofen Fenoprofen Pabalate or Pabalate-SF   Buffets II Flogesic Panagesic   Buffinol plain or Extra Strength Florinal or Florinal with Codeine Panwarfarin   Buf-Tabs Flurbiprofen Penicillamine   Butalbital Compound Four-way cold tablets Penicillin   Butazolidin Fragmin Pepto-Bismol   Carbenicillin Geminisyn Percodan   Carna Arthritis Reliever Geopen Persantine   Carprofen Gold's salt Persistin   Chloramphenicol Goody's Phenylbutazone   Chloromycetin Haltrain Piroxlcam   Clmetidine heparin Plaquenil   Cllnoril Hyco-pap Ponstel   Clofibrate Hydroxy chloroquine Propoxyphen         Before stopping any of these medications, be sure to  consult the physician who ordered them.  Some, such as Coumadin (Warfarin) are ordered to prevent or treat serious conditions such as "deep thrombosis", "pumonary embolisms", and other heart problems.  The amount of time that you may need off of the medication may also vary with the medication and the reason for which you were taking it.  If you are taking any of these medications, please make sure you notify your pain physician before you undergo any procedures.         Lumbar Sympathetic Block Patient Information  Description: The lumbar plexus is a group of nerves that are part of the sympathetic nervous system.  These nerves supply organs in the pelvis and legs.  Lumbar sympathetic blocks are utilized for the diagnosis and treatment of painful conditions in these  areas.   The lumbar plexus is located on both sides of the aorta at approximately the level of the second lumbar vertebral body.  The block will be performed with you lying on your abdomen with a pillow underneath.  Using direct x-ray guidance,   The plexus will be located on both sides of the spine.  Numbing medicine will be used to deaden the skin prior to needle insertion.  In most cases, a small amount of sedation can be give by IV prior to the numbing medicine.  One or two small needles will be placed near the plexus and local anesthetic will be injected.  This may make your leg(s) feel warm.  The Entire block usually lasts about 15-25 minutes.  Conditions which may be treated by lumbar sympathetic block:   Reflex sympathetic dystrophy  Phantom limb pain  Peripheral neuropathy  Peripheral vascular disease ( inadequate blood flow )  Cancer pain of pelvis, leg and kidney  Preparation for the injection:  1. Do note eat any solid food or diary products within 6 hours of your appointment. 2. You may drink clear liquids up to 2 hours before appointment.  Clear liquids include water, black coffee, juice or soda.  No milk or  cream please. 3. You may take your regular medication, including pain medications, with a sip of water before you appointment.  Diabetics should hold regular insulin ( if taken separately ) and take 1/2 NPH dose the morning of the procedure .  Carry some sugar containing items with you to your appointment. 4. A driver must accompany you and be prepared to drive you home after your procedure. 5. Bring all your current medication with you. 6. An IV may be inserted and sedation may be given at the discretion of the physician.  7. A blood pressure cuff, EKG and other monitors will often be applied during the procedure.  Some patients may need to have extra oxygen administered for a short period. 8. You will be asked to provide medical information, including your allergies and medications, prior to the procedure.  We must know immediately if your taking blood thinners (like Coumadin/Warfarin) or if you are allergic to IV iodine contrast (dye).  We must know if you could possibly be pregnant.  Possible side-effects   Bleeding from needle site or deeper  Infection (rare, can require surgery)  Nerve injury (rare)  Numbness & tingling (temporary)  Collapsed lung (rare)  Spinal headache (a headache worse with upright posture)  Light-headedness (temporary)  Pain at injection site (several days)  Decreased blood pressure (temporary)  Weakness in legs (temporary)  Seizure or other drug reaction (rare)  Call if you experience:   Fever/chills associated with headache or increased back/ neck pain  Headache worsened by an upright position  New onset weakness or numbness of an extremity below the injection site  Hives or difficulty breathing ( go to the emergency room)  Inflammation or drainage at the injections site(s)  New symptoms which are concerning to you  Please note:  If effective, we will often do a series of 2-3 injections spaced 3-6 weeks apart to maximally decrease your  pain.  If initial series is effective, you may be a candidate for a more permanent block of the lumbar sympathetic plexus.  If you have any questions please call [phone] [facility]

## 2014-10-25 NOTE — Progress Notes (Signed)
   Subjective:    Patient ID: Janet Murray, female    DOB: 03-Dec-1970, 44 y.o.   MRN: [id number]  HPI   patient is 44 year old female returns to [place] for further evaluation and treatment of pain involving the lower back and lower extremity region predominantly. She states that she has had pain involving the lower extremities especially during the evening patient is discussed condition with Dr. Brynda Greathouse and there is concern regarding patient and restless leg syndrome. We discussed patient's condition and will consider patient for lumbar sympathetic block at time of return appointment. We will also schedule neurosurgical evaluation of patient's lumbar and lower extremity pain as has previously been discussed. The patient is understanding and in agreement with suggested treatment plan.           Review of Systems     Objective:   Physical Exam   there was tenderness over the splenius capitis muscles and the occipitalis muscles. Palpation over the acromioclavicular and glenohumeral joint region reproduced pain of mild degree Tinel's and Phalen's maneuver associated with increased pain a moderate degree with decreased grip strength haven't been noted. There was tenderness over the cervical and thoracic facet and paraspinal musculature regions of mild to moderate degree with palpation over the lumbar paraspinal musculature region reproducing moderate to moderately severe discomfort. Straight leg raising was tolerated to approximately 20 without a definite increased pain with dorsiflexion noted was negative clonus and negative Homans no definite sensory deficit of dermatomal distribution of the lower extremity is noted. Abdomen nontender and no costovertebral angle tenderness noted.     Assessment & Plan:    restless leg syndrome   generative disc disease lumbar spine  prior MRI revealed degenerative changes L4-5    lumbar facet syndrome       plan    Continue present  medications Zanaflex and oxycodone    We will perform lumbar sympathetic block at time of return appointment for restless leg syndrome which is call severe discomfort and has interfered patient ability to obtain restful sleep as well as perform of activities of daily living.  F/U PCP for evaliation of  BP and general medical  condition.  F/U surgical evaluation.  F/U neurological evaluation.  May consider radiofrequency rhizolysis or intraspinal procedures pending response to present treatment and F/U evaluation.  [facility] to call [facility] should patient have concerns prior to scheduled return appointment.

## 2014-10-31 ENCOUNTER — Other Ambulatory Visit: Payer: Self-pay | Admitting: Pain Medicine

## 2014-11-06 ENCOUNTER — Other Ambulatory Visit: Payer: Self-pay | Admitting: Pain Medicine

## 2014-11-06 DIAGNOSIS — G609 Hereditary and idiopathic neuropathy, unspecified: Secondary | ICD-10-CM

## 2014-11-06 DIAGNOSIS — M5136 Other intervertebral disc degeneration, lumbar region: Secondary | ICD-10-CM

## 2014-11-06 DIAGNOSIS — G2581 Restless legs syndrome: Secondary | ICD-10-CM

## 2014-11-06 DIAGNOSIS — M533 Sacrococcygeal disorders, not elsewhere classified: Secondary | ICD-10-CM

## 2014-11-06 DIAGNOSIS — M4726 Other spondylosis with radiculopathy, lumbar region: Secondary | ICD-10-CM

## 2014-11-06 HISTORY — DX: Hereditary and idiopathic neuropathy, unspecified: G60.9

## 2014-11-08 ENCOUNTER — Encounter: Payer: Self-pay | Admitting: Pain Medicine

## 2014-11-08 ENCOUNTER — Ambulatory Visit: Payer: Medicare Other | Attending: Pain Medicine | Admitting: Pain Medicine

## 2014-11-08 VITALS — BP 99/52 | HR 75 | Temp 97.9°F | Resp 14 | Ht 61.0 in | Wt 168.0 lb

## 2014-11-08 DIAGNOSIS — M47896 Other spondylosis, lumbar region: Secondary | ICD-10-CM | POA: Insufficient documentation

## 2014-11-08 DIAGNOSIS — R209 Unspecified disturbances of skin sensation: Secondary | ICD-10-CM | POA: Insufficient documentation

## 2014-11-08 DIAGNOSIS — M533 Sacrococcygeal disorders, not elsewhere classified: Secondary | ICD-10-CM

## 2014-11-08 DIAGNOSIS — G5602 Carpal tunnel syndrome, left upper limb: Secondary | ICD-10-CM

## 2014-11-08 DIAGNOSIS — M5416 Radiculopathy, lumbar region: Secondary | ICD-10-CM

## 2014-11-08 DIAGNOSIS — M79605 Pain in left leg: Secondary | ICD-10-CM | POA: Diagnosis present

## 2014-11-08 DIAGNOSIS — M5136 Other intervertebral disc degeneration, lumbar region: Secondary | ICD-10-CM

## 2014-11-08 DIAGNOSIS — G2581 Restless legs syndrome: Secondary | ICD-10-CM

## 2014-11-08 DIAGNOSIS — M79604 Pain in right leg: Secondary | ICD-10-CM | POA: Diagnosis present

## 2014-11-08 DIAGNOSIS — G609 Hereditary and idiopathic neuropathy, unspecified: Secondary | ICD-10-CM

## 2014-11-08 DIAGNOSIS — M4726 Other spondylosis with radiculopathy, lumbar region: Secondary | ICD-10-CM

## 2014-11-08 DIAGNOSIS — M5481 Occipital neuralgia: Secondary | ICD-10-CM

## 2014-11-08 DIAGNOSIS — M47816 Spondylosis without myelopathy or radiculopathy, lumbar region: Secondary | ICD-10-CM

## 2014-11-08 MED ORDER — MIDAZOLAM HCL 5 MG/5ML IJ SOLN
INTRAMUSCULAR | Status: AC
Start: 2014-11-08 — End: 2014-11-08
  Administered 2014-11-08: 4 mg via INTRAVENOUS
  Filled 2014-11-08: qty 5

## 2014-11-08 MED ORDER — TRIAMCINOLONE ACETONIDE 40 MG/ML IJ SUSP
INTRAMUSCULAR | Status: AC
  Filled 2014-11-08: qty 1

## 2014-11-08 MED ORDER — BUPIVACAINE HCL (PF) 0.25 % IJ SOLN
INTRAMUSCULAR | Status: AC
  Administered 2014-11-08: 30 mL
  Filled 2014-11-08: qty 30

## 2014-11-08 MED ORDER — ORPHENADRINE CITRATE 30 MG/ML IJ SOLN
INTRAMUSCULAR | Status: AC
  Filled 2014-11-08: qty 2

## 2014-11-08 MED ORDER — FENTANYL CITRATE (PF) 100 MCG/2ML IJ SOLN
INTRAMUSCULAR | Status: AC
Start: 2014-11-08 — End: 2014-11-08
  Administered 2014-11-08: 100 ug via INTRAVENOUS
  Filled 2014-11-08: qty 2

## 2014-11-08 NOTE — Progress Notes (Signed)
Subjective:    Patient ID: Janet Murray, female    DOB: [birth year], 44 y.o.   MRN: [id number]  HPI   PROCEDURE PERFORMED: Lumbar sympathetic block.  HISTORY OF PRESENT ILLNESS: The patient is 44 y.o. female who returns to [facility] for further evaluation and treatment of pain involving the lower extremities. The patient is with history of severely disabling burning stinging pain of the lower extremity regions with prior MRI revealing patient to be with evidence of degenerative changes of the lumbar spine L4-L5 level involvement predominantly with concern regarding radicular pain and radiculitis felt to be contributing to the lumbar lower extremity pain paresthesias with burning stinging throbbing pain of the lower extremity. There is concern regarding the patient's pain being due to neuropathic component . The risks, benefits, and expectations of the procedure were discussed and explained to the patient who was understanding and wished to proceed with interventional treatment as planned.   DESCRIPTION OF PROCEDURE: Lumbar sympathetic block with IV Versed, IV fentanyl conscious sedation, EKG, blood pressure, pulse, pulse oximetry monitoring. The procedure was performed with the patient in prone position under fluoroscopic guidance.   NEEDLE PLACEMENT AT L2, right side lumbar sympathetic block: With the patient in prone position and oblique orientation of 20 degrees, Betadine prep and local anesthetic skin wheal of 1.5% lidocaine plain was prepared at the proposed needle entry site. Under fluoroscopic guidance with 20 oblique orientation, the 22 -gauge needle was inserted at the lateral border of the L2 vertebral body on the right side.   NEEDLE PLACEMENT AT L3, right side lumbar sympathetic block: With the patient in the prone position and oblique orientation of 20  degrees, Betadine prep and local anesthetic skin wheal of 1.5% lidocaine plain was prepared at the proposed needle entry  site. Under fluoroscopic guidance with 20 oblique orientation, the 22 -gauge needle was inserted at the lateral border of the L3 vertebral body on the right side.   NEEDLE PLACEMENT AT L4, right side lumbar sympathetic block: With the patient in prone position and oblique orientation of 20 degrees, Betadine prep and local anesthetic skin wheal of 1.5% lidocaine plain was prepared at the proposed needle entry site. Under fluoroscopic guidance with 20 oblique orientation, the 22 -gauge needle was inserted at the lateral border of the L4 vertebral body on the right side.    Following needle placement at the L2, L3 and L4 vertebral body levels on the right side, needle placement was then verified on lateral view with tip of the needle documented to be in the anterior third of the vertebral body of L2, L3 and L4 respectively. Following negative aspiration of each needle for heme and CSF, L2 vertebral body level needle was injected with 10 mL of 0.25% bupivacaine. L3 vertebral body level needle was injected with 10 mL of 0.25% bupivacaine. L4 vertebral body level was injected with 10 mL of 0.25% bupivacaine. Needles were removed. Please note temperature readings prior to lumbar sympathetic block were noted to be 87 degrees Fahrenheit and following completion of the lumbar sympathetic block temperature readings of the lower extremity were noted to be 91 degrees Fahrenheit. The patient tolerated the procedure well.  PLAN:   1. Medications: We will continue presently prescribed medications at this time. 2. The patient is to follow-up with primary care physician for further evaluation of blood pressure and general medical condition as discussed. 3. Surgical evaluation as discussed. 4. Neurological evaluation as discussed. 5. The patient may be candidate  for radiofrequency procedures, implantation devices, and other treatment pending response to treatment and follow-up evaluation. 6. The patient has been advised  to adhere to proper body mechanics. 7. The patient has been advised to call the [facility] Management Center prior to scheduled return appointment should there be significant change in condition or have other concerns regarding condition prior to scheduled return appointment.   The patient was understanding and in agreement with suggested treatment plan.       Review of Systems     Objective:   Physical Exam        Assessment & Plan:

## 2014-11-08 NOTE — Progress Notes (Signed)
Safety precautions to be maintained throughout the outpatient stay will include: orient to surroundings, keep bed in low position, maintain call bell within reach at all times, provide assistance with transfer out of bed and ambulation.  

## 2014-11-08 NOTE — Patient Instructions (Addendum)
Smoking Cessation Quitting smoking is important to your health and has many advantages. However, it is not always easy to quit since nicotine is a very addictive drug. Oftentimes, people try 3 times or more before being able to quit. This document explains the best ways for you to prepare to quit smoking. Quitting takes hard work and a lot of effort, but you can do it. ADVANTAGES OF QUITTING SMOKING  You will live longer, feel better, and live better.  Your body will feel the impact of quitting smoking almost immediately.  Within 20 minutes, blood pressure decreases. Your pulse returns to its normal level.  After 8 hours, carbon monoxide levels in the blood return to normal. Your oxygen level increases.  After 24 hours, the chance of having a heart attack starts to decrease. Your breath, hair, and body stop smelling like smoke.  After 48 hours, damaged nerve endings begin to recover. Your sense of taste and smell improve.  After 72 hours, the body is virtually free of nicotine. Your bronchial tubes relax and breathing becomes easier.  After 2 to 12 weeks, lungs can hold more air. Exercise becomes easier and circulation improves.  The risk of having a heart attack, stroke, cancer, or lung disease is greatly reduced.  After 1 year, the risk of coronary heart disease is cut in half.  After 5 years, the risk of stroke falls to the same as a nonsmoker.  After 10 years, the risk of lung cancer is cut in half and the risk of other cancers decreases significantly.  After 15 years, the risk of coronary heart disease drops, usually to the level of a nonsmoker.  If you are pregnant, quitting smoking will improve your chances of having a healthy baby.  The people you live with, especially any children, will be healthier.  You will have extra money to spend on things other than cigarettes. QUESTIONS TO THINK ABOUT BEFORE ATTEMPTING TO QUIT You may want to talk about your answers with your  health care provider.  Why do you want to quit?  If you tried to quit in the past, what helped and what did not?  What will be the most difficult situations for you after you quit? How will you plan to handle them?  Who can help you through the tough times? Your family? Friends? A health care provider?  What pleasures do you get from smoking? What ways can you still get pleasure if you quit? Here are some questions to ask your health care provider:  How can you help me to be successful at quitting?  What medicine do you think would be best for me and how should I take it?  What should I do if I need more help?  What is smoking withdrawal like? How can I get information on withdrawal? GET READY  Set a quit date.  Change your environment by getting rid of all cigarettes, ashtrays, matches, and lighters in your home, car, or work. Do not let people smoke in your home.  Review your past attempts to quit. Think about what worked and what did not. GET SUPPORT AND ENCOURAGEMENT You have a better chance of being successful if you have help. You can get support in many ways.  Tell your family, friends, and coworkers that you are going to quit and need their support. Ask them not to smoke around you.  Get individual, group, or telephone counseling and support. Programs are available at local hospitals and health centers. Call   your local health department for information about programs in your area.  Spiritual beliefs and practices may help some smokers quit.  Download a "quit meter" on your computer to keep track of quit statistics, such as how long you have gone without smoking, cigarettes not smoked, and money saved.  Get a self-help book about quitting smoking and staying off tobacco. [place] yourself from urges to smoke. Talk to someone, go for a walk, or occupy your time with a task.  Change your normal routine. Take a different route to work.  Drink tea instead of coffee. Eat breakfast in a different place.  Reduce your stress. Take a hot bath, exercise, or read a book.  Plan something enjoyable to do every day. Reward yourself for not smoking.  Explore interactive web-based programs that specialize in helping you quit. GET MEDICINE AND USE IT CORRECTLY Medicines can help you stop smoking and decrease the urge to smoke. Combining medicine with the above behavioral methods and support can greatly increase your chances of successfully quitting smoking.  Nicotine replacement therapy helps deliver nicotine to your body without the negative effects and risks of smoking. Nicotine replacement therapy includes nicotine gum, lozenges, inhalers, nasal sprays, and skin patches. Some may be available over-the-counter and others require a prescription.  Antidepressant medicine helps people abstain from smoking, but how this works is unknown. This medicine is available by prescription.  Nicotinic receptor partial agonist medicine simulates the effect of nicotine in your brain. This medicine is available by prescription. Ask your health care provider for advice about which medicines to use and how to use them based on your health history. Your health care provider will tell you what side effects to look out for if you choose to be on a medicine or therapy. Carefully read the information on the package. Do not use any other product containing nicotine while using a nicotine replacement product.  RELAPSE OR DIFFICULT SITUATIONS Most relapses occur within the first 3 months after quitting. Do not be discouraged if you start smoking again. Remember, most people try several times before finally quitting. You may have symptoms of withdrawal because your body is used to nicotine. You may crave cigarettes, be irritable, feel very hungry, cough often, get headaches, or have difficulty concentrating. The withdrawal symptoms are only temporary. They are strongest  when you first quit, but they will go away within 10-14 days. To reduce the chances of relapse, try to:  Avoid drinking alcohol. Drinking lowers your chances of successfully quitting.  Reduce the amount of caffeine you consume. Once you quit smoking, the amount of caffeine in your body increases and can give you symptoms, such as a rapid heartbeat, sweating, and anxiety.  Avoid smokers because they can make you want to smoke.  Do not let weight gain distract you. Many smokers will gain weight when they quit, usually less than 10 pounds. Eat a healthy diet and stay active. You can always lose the weight gained after you quit.  Find ways to improve your mood other than smoking. FOR MORE INFORMATION  www.smokefree.gov  Document Released: 05/20/2001 Document Revised: 10/10/2013 Document Reviewed: 09/04/2011 [facility] Patient Information 2015 [place], [place]. This information is not intended to replace advice given to you by your health care provider. Make sure you discuss any questions you have with your health care provider. Continue present medications.  F/U PCP for evaliation of  BP and general medical  condition.  F/U surgical evaluation.  F/U  neurological evaluation.  May consider radiofrequency rhizolysis or intraspinal procedures pending response to present treatment and F/U evaluation.  Patient to call Pain Management Center should patient have concerns prior to scheduled return appointment. Pain Management Discharge Instructions  General Discharge Instructions :  If you need to reach your doctor call: Monday-Friday 8:00 am - 4:00 pm at [phone] or toll free [phone].  After clinic hours [phone] to have operator reach doctor.  Bring all of your medication bottles to all your appointments in the pain clinic.  To cancel or reschedule your appointment with Pain Management please remember to call 24 hours in advance to avoid a fee.  Refer to the educational materials  which you have been given on: General Risks, I had my Procedure. Discharge Instructions, Post Sedation.  Post Procedure Instructions:  The drugs you were given will stay in your system until tomorrow, so for the next 24 hours you should not drive, make any legal decisions or drink any alcoholic beverages.  You may eat anything you prefer, but it is better to start with liquids then soups and crackers, and gradually work up to solid foods.  Please notify your doctor immediately if you have any unusual bleeding, trouble breathing or pain that is not related to your normal pain.  Depending on the type of procedure that was done, some parts of your body may feel week and/or numb.  This usually clears up by tonight or the next day.  Walk with the use of an assistive device or accompanied by an adult for the 24 hours.  You may use ice on the affected area for the first 24 hours.  Put ice in a Ziploc bag and cover with a towel and place against area 15 minutes on 15 minutes off.  You may switch to heat after 24 hours.

## 2014-11-09 ENCOUNTER — Telehealth: Payer: Self-pay | Admitting: *Deleted

## 2014-11-09 NOTE — Telephone Encounter (Signed)
NO PROBLEMS

## 2014-11-22 ENCOUNTER — Encounter: Payer: Self-pay | Admitting: Pain Medicine

## 2014-11-22 ENCOUNTER — Ambulatory Visit: Payer: Medicare Other | Attending: Pain Medicine | Admitting: Pain Medicine

## 2014-11-22 VITALS — BP 106/64 | HR 91 | Temp 98.0°F | Resp 15 | Ht 61.0 in | Wt 168.0 lb

## 2014-11-22 DIAGNOSIS — M545 Low back pain: Secondary | ICD-10-CM | POA: Diagnosis present

## 2014-11-22 DIAGNOSIS — M4806 Spinal stenosis, lumbar region: Secondary | ICD-10-CM | POA: Diagnosis not present

## 2014-11-22 DIAGNOSIS — M65822 Other synovitis and tenosynovitis, left upper arm: Secondary | ICD-10-CM | POA: Diagnosis not present

## 2014-11-22 DIAGNOSIS — G609 Hereditary and idiopathic neuropathy, unspecified: Secondary | ICD-10-CM

## 2014-11-22 DIAGNOSIS — M79605 Pain in left leg: Secondary | ICD-10-CM | POA: Diagnosis present

## 2014-11-22 DIAGNOSIS — M79604 Pain in right leg: Secondary | ICD-10-CM | POA: Diagnosis present

## 2014-11-22 DIAGNOSIS — G5600 Carpal tunnel syndrome, unspecified upper limb: Secondary | ICD-10-CM | POA: Insufficient documentation

## 2014-11-22 DIAGNOSIS — M4726 Other spondylosis with radiculopathy, lumbar region: Secondary | ICD-10-CM

## 2014-11-22 DIAGNOSIS — M5136 Other intervertebral disc degeneration, lumbar region: Secondary | ICD-10-CM | POA: Diagnosis not present

## 2014-11-22 DIAGNOSIS — G5602 Carpal tunnel syndrome, left upper limb: Secondary | ICD-10-CM

## 2014-11-22 DIAGNOSIS — M47816 Spondylosis without myelopathy or radiculopathy, lumbar region: Secondary | ICD-10-CM | POA: Diagnosis not present

## 2014-11-22 DIAGNOSIS — I739 Peripheral vascular disease, unspecified: Secondary | ICD-10-CM | POA: Diagnosis not present

## 2014-11-22 DIAGNOSIS — M5416 Radiculopathy, lumbar region: Secondary | ICD-10-CM

## 2014-11-22 DIAGNOSIS — G2581 Restless legs syndrome: Secondary | ICD-10-CM

## 2014-11-22 DIAGNOSIS — M5481 Occipital neuralgia: Secondary | ICD-10-CM

## 2014-11-22 DIAGNOSIS — M533 Sacrococcygeal disorders, not elsewhere classified: Secondary | ICD-10-CM

## 2014-11-22 MED ORDER — TIZANIDINE HCL 2 MG PO CAPS: ORAL_CAPSULE | ORAL | Status: DC

## 2014-11-22 MED ORDER — OXYCODONE-ACETAMINOPHEN 5-325 MG PO TABS: ORAL_TABLET | ORAL | Status: DC

## 2014-11-22 NOTE — Progress Notes (Signed)
Discharged to home with scripts of oxycodone for one month zanaflex e  scriopted to pharmacy  Left [place] times 3

## 2014-11-22 NOTE — Patient Instructions (Addendum)
Continue present medications. Caution Zanaflex can cause drowsiness confusion and other side effects. Limited Zanaflex dose per day as discussed to avoid excessive drowsiness confusion and other side effects  Lumbar epidural steroid injection to be performed at time of return appointment  F/U PCP for evaliation of  BP and general medical  condition.  F/U surgical evaluation.. We will call the office of Dr. Brynda Greathouse to address that he schedule neurosurgical evaluation of your lower back lower extremity pain paresthesias and weakness area in need to have neurosurgical evaluation at this time as discussed to evaluate the lower back lower extremity pain and weakness F/U neurological evaluation.  May consider radiofrequency rhizolysis or intraspinal procedures pending response to present treatment and F/U evaluation.  Patient to call Pain Management Center should patient have concerns prior to scheduled return appointment. Epidural Steroid Injection Patient Information  Description: The epidural space surrounds the nerves as they exit the spinal cord.  In some patients, the nerves can be compressed and inflamed by a bulging disc or a tight spinal canal (spinal stenosis).  By injecting steroids into the epidural space, we can bring irritated nerves into direct contact with a potentially helpful medication.  These steroids act directly on the irritated nerves and can reduce swelling and inflammation which often leads to decreased pain.  Epidural steroids may be injected anywhere along the spine and from the neck to the low back depending upon the location of your pain.   After numbing the skin with local anesthetic (like Novocaine), a small needle is passed into the epidural space slowly.  You may experience a sensation of pressure while this is being done.  The entire block usually last less than 10 minutes.  Conditions which may be treated by epidural steroids:   Low back and leg pain  Neck and arm  pain  Spinal stenosis  Post-laminectomy syndrome  Herpes zoster (shingles) pain  Pain from compression fractures  Preparation for the injection:  1. Do not eat any solid food or dairy products within 6 hours of your appointment.  2. You may drink clear liquids up to 2 hours before appointment.  Clear liquids include water, black coffee, juice or soda.  No milk or cream please. 3. You may take your regular medication, including pain medications, with a sip of water before your appointment  Diabetics should hold regular insulin (if taken separately) and take 1/2 normal NPH dos the morning of the procedure.  Carry some sugar containing items with you to your appointment. 4. A driver must accompany you and be prepared to drive you home after your procedure.  5. Bring all your current medications with your. 6. An IV may be inserted and sedation may be given at the discretion of the physician.   7. A blood pressure cuff, EKG and other monitors will often be applied during the procedure.  Some patients may need to have extra oxygen administered for a short period. 8. You will be asked to provide medical information, including your allergies, prior to the procedure.  We must know immediately if you are taking blood thinners (like Coumadin/Warfarin)  Or if you are allergic to IV iodine contrast (dye). We must know if you could possible be pregnant.  Possible side-effects:  Bleeding from needle site  Infection (rare, may require surgery)  Nerve injury (rare)  Numbness & tingling (temporary)  Difficulty urinating (rare, temporary)  Spinal headache ( a headache worse with upright posture)  Light -headedness (temporary)  Pain at injection  site (several days)  Decreased blood pressure (temporary)  Weakness in arm/leg (temporary)  Pressure sensation in back/neck (temporary)  Call if you experience:  Fever/chills associated with headache or increased back/neck pain.  Headache  worsened by an upright position.  New onset weakness or numbness of an extremity below the injection site  Hives or difficulty breathing (go to the emergency room)  Inflammation or drainage at the infection site  Severe back/neck pain  Any new symptoms which are concerning to you  Please note:  Although the local anesthetic injected can often make your back or neck feel good for several hours after the injection, the pain will likely return.  It takes 3-7 days for steroids to work in the epidural space.  You may not notice any pain relief for at least that one week.  If effective, we will often do a series of three injections spaced 3-6 weeks apart to maximally decrease your pain.  After the initial series, we generally will wait several months before considering a repeat injection of the same type.  If you have any questions, please call [phone] [facility]

## 2014-11-22 NOTE — Progress Notes (Signed)
   Subjective:    Patient ID: Janet Murray, female    DOB: December 13, 1970, 44 y.o.   MRN: [id number]  HPI  Patient is 44 year old female returns to [place] for further evaluation and treatment of pain involving the lower back lower extremity regions predominantly. Patient states that her pain is associated with lower extremity weakness after prolonged standing and walking. We discussed patient's condition and have attempted to schedule neurosurgical evaluation. We will request patient's primary care physician, Dr. Brynda Greathouse, to proceed with scheduling neurosurgical evaluation for lower back lower extremity pain paresthesias and weakness. We will proceed with lumbar epidural steroid injection at time return appointment in attempt to decrease severity of patient's symptoms, hopefully minimize progression of patient's symptoms, and hopefully avoid the need for more involved treatment. The patient was understanding and agreement with suggested treatment plan.    Review of Systems     Objective:   Physical Exam  There was tenderness to palpation of the splenius capitis and occipitalis musculature regions a mild degree. There was tenderness over the cervical and thoracic facet regions of mild degree. There was unremarkable Spurling's maneuver. Tinel and Phalen's maneuver associated increased pain on the left greater than the right and there was decreased grip strength noted on the left compared to the right. Palpation over the thoracic facet thoracic paraspinal musculature region was a tends to palpation in the lower thoracic region with muscle spasms in the lower thoracic region noted on the left as well as on the right. Pressures of the thoracic region was noted. Palpation over the lumbar paraspinal musculature region lumbar facet region associated increased pain of moderate to moderately severe degree with lateral bending and rotation and extension to palpation of the lumbar facets reproducing  moderate to moderately severe discomfort. Leg raising was tolerates approximately 20 without a definite increased pain with dorsiflexion noted. EHL strength appeared to be decreased. There was negative clonus negative Homans. No definite sensory deficit of dermatomal distribution detected. There was tends to palpation over the PSIS and PII S regions of mild to moderate degree. Abdomen was soft nontender and no costovertebral maintenance was noted.      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 Zanaflex and oxycodone acetaminophen. Patient was cautioned regarding the left. Depression confusion and other side effects which can occur with Zanaflex and oxycodone and was cautioned regarding these medications.  F/U PCP for evaliation of  BP and general medical  condition.  F/U surgical evaluation. We will call the office of Dr. Brynda Greathouse to request Dr. Brynda Greathouse schedule neurosurgical evaluation of patient's lower back and lower extremity pain paresthesias and weakness  F/U neurological evaluation.  May consider radiofrequency rhizolysis or intraspinal procedures pending response to present treatment and F/U evaluation.  Patient to call Pain Management Center should patient have concerns prior to scheduled return appointment.

## 2014-11-30 ENCOUNTER — Emergency Department
Admission: EM | Admit: 2014-11-30 | Discharge: 2014-12-01 | Disposition: A | Discharge status: Home / Self Care | Payer: Medicare Other | Attending: Emergency Medicine | Admitting: Emergency Medicine

## 2014-11-30 ENCOUNTER — Encounter: Payer: Self-pay | Admitting: *Deleted

## 2014-11-30 ENCOUNTER — Emergency Department: Payer: Medicare Other

## 2014-11-30 DIAGNOSIS — R109 Unspecified abdominal pain: Secondary | ICD-10-CM

## 2014-11-30 DIAGNOSIS — N12 Tubulo-interstitial nephritis, not specified as acute or chronic: Secondary | ICD-10-CM | POA: Diagnosis not present

## 2014-11-30 DIAGNOSIS — Z79899 Other long term (current) drug therapy: Secondary | ICD-10-CM | POA: Insufficient documentation

## 2014-11-30 DIAGNOSIS — E876 Hypokalemia: Secondary | ICD-10-CM | POA: Diagnosis not present

## 2014-11-30 DIAGNOSIS — R1030 Lower abdominal pain, unspecified: Secondary | ICD-10-CM | POA: Diagnosis present

## 2014-11-30 DIAGNOSIS — Z880 Allergy status to penicillin: Secondary | ICD-10-CM | POA: Diagnosis not present

## 2014-11-30 DIAGNOSIS — Z720 Tobacco use: Secondary | ICD-10-CM | POA: Diagnosis not present

## 2014-11-30 LAB — URINALYSIS COMPLETE WITH MICROSCOPIC (ARMC ONLY)
BILIRUBIN URINE: NEGATIVE
Glucose, UA: NEGATIVE mg/dL
Nitrite: NEGATIVE
PH: 5 (ref 5.0–8.0)
Protein, ur: 30 mg/dL — AB
SPECIFIC GRAVITY, URINE: 1.021 (ref 1.005–1.030)

## 2014-11-30 MED ORDER — MORPHINE SULFATE 4 MG/ML IJ SOLN
4.0000 mg | Freq: Once | INTRAMUSCULAR | Status: AC
  Administered 2014-12-01: 4 mg via INTRAVENOUS

## 2014-11-30 MED ORDER — SODIUM CHLORIDE 0.9 % IV BOLUS (SEPSIS)
1000.0000 mL | Freq: Once | INTRAVENOUS | Status: AC
  Administered 2014-12-01: 1000 mL via INTRAVENOUS

## 2014-11-30 MED ORDER — CEPHALEXIN 500 MG PO CAPS: 500.0000 mg | ORAL_CAPSULE | Freq: Three times a day (TID) | ORAL | Status: AC

## 2014-11-30 MED ORDER — ONDANSETRON HCL 4 MG/2ML IJ SOLN
INTRAMUSCULAR | Status: AC
  Administered 2014-12-01: 4 mg via INTRAVENOUS
  Filled 2014-11-30: qty 2

## 2014-11-30 MED ORDER — CEPHALEXIN 500 MG PO CAPS
ORAL_CAPSULE | ORAL | Status: AC
  Administered 2014-11-30: 500 mg via ORAL
  Filled 2014-11-30: qty 1

## 2014-11-30 MED ORDER — CEPHALEXIN 500 MG PO CAPS
500.0000 mg | ORAL_CAPSULE | Freq: Once | ORAL | Status: AC
  Administered 2014-11-30: 500 mg via ORAL

## 2014-11-30 MED ORDER — ONDANSETRON HCL 4 MG/2ML IJ SOLN
4.0000 mg | Freq: Once | INTRAMUSCULAR | Status: AC
  Administered 2014-12-01: 4 mg via INTRAVENOUS

## 2014-11-30 MED ORDER — MORPHINE SULFATE 4 MG/ML IJ SOLN
INTRAMUSCULAR | Status: AC
  Administered 2014-12-01: 4 mg via INTRAVENOUS
  Filled 2014-11-30: qty 1

## 2014-11-30 NOTE — ED Provider Notes (Signed)
[facility] Emergency Department Provider Note  ____________________________________________  Time seen: Approximately 9390 PM  I have reviewed the triage vital signs and the nursing notes.   HISTORY  Chief Complaint Abdominal Pain    HPI Janet Murray is a 44 y.o. female with a history of bladder spasm on Vesicare and presents tonight with 1 day of urinary frequency, hesitancy and pressure to the suprapubic area as well as right flank. She denies any history of kidney stones.  Says she has had an appendectomy as well as a hysterectomy with both of her ovaries out and vaginal sling procedure. She is followed at [facility] side by Dr. Kenton Kingfisher. She denies any nausea or vomiting. Says that she is having pressure when urinating. Has had UTI in the past with similar symptoms.Denies any vaginal bleeding or discharge.   Past Medical History  Diagnosis Date  . Urinary incontinence   . Arthritis   . GERD (gastroesophageal reflux disease)   . Chronic pain     Patient Active Problem List   Diagnosis Date Noted  . Lumbar radiculopathy 11/08/2014  . Hereditary and idiopathic peripheral neuropathy 11/06/2014  . 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 Facet syndrome, lumbar 10/25/2014  . Sacroiliac joint dysfunction of both sides 10/25/2014  . Carpal tunnel syndrome of left wrist 10/25/2014  . Occipital neuralgia 10/25/2014  . Restless leg syndrome 10/25/2014  . DDD (degenerative disc disease), lumbar 10/25/2014    Past Surgical History  Procedure Laterality Date  . Bladder surgery    . Cesarean section    . Appendectomy      Current Outpatient Rx  Name  Route  Sig  Dispense  Refill  . esomeprazole (NEXIUM) 40 MG capsule   Oral   Take 40 mg by mouth 2 (two) times daily.         Marland Kitchen levothyroxine (SYNTHROID, LEVOTHROID) 50 MCG tablet   Oral   Take 50 mcg by mouth daily.         Marland Kitchen LORazepam (ATIVAN) 0.5 MG tablet   Oral    Take 0.5 mg by mouth every 8 (eight) hours as needed for anxiety.         . meloxicam (MOBIC) 15 MG tablet   Oral   Take 15 mg by mouth daily.         . montelukast (SINGULAIR) 10 MG tablet   Oral   Take 10 mg by mouth at bedtime.         Marland Kitchen oxyCODONE-acetaminophen (PERCOCET/ROXICET) 5-325 MG per tablet      Limit one half to 1 tab by mouth per day or twice a day if tolerated   60 tablet   0   . phentermine 30 MG capsule   Oral   Take 30 mg by mouth 2 (two) times daily.         . pramipexole (MIRAPEX) 0.5 MG tablet   Oral   Take 0.5 mg by mouth at bedtime.         . solifenacin (VESICARE) 5 MG tablet   Oral   Take 5 mg by mouth daily.         Marland Kitchen tiotropium (SPIRIVA) 18 MCG inhalation capsule   Inhalation   Place 18 mcg into inhaler and inhale daily.         . tizanidine (ZANAFLEX) 2 MG capsule      Limit 1 to 2 tabs by mouth per day or twice a day if  tolerated   120 capsule   0   . topiramate (TOPAMAX) 25 MG capsule   Oral   Take 25 mg by mouth daily. 1-2 tablets           Allergies Penicillins; Aspirin; and Tape  Family History  Problem Relation Age of Onset  . Arthritis Father   . Heart disease Father   . Hypertension Father     Social History History  Substance Use Topics  . Smoking status: Current Every Day Smoker -- 0.50 packs/day    Types: Cigarettes  . Smokeless tobacco: Not on file  . Alcohol Use: No    Review of Systems Constitutional: No fever/chills Eyes: No visual changes. ENT: No sore throat. Cardiovascular: Denies chest pain. Respiratory: Denies shortness of breath. Gastrointestinal: No nausea, no vomiting.  No diarrhea.  No constipation. Genitourinary: As above  Musculoskeletal: Negative for back pain. Skin: Negative for rash. Neurological: Negative for headaches, focal weakness or numbness.  10-point ROS otherwise negative.  ____________________________________________   PHYSICAL EXAM:  VITAL SIGNS: ED  Triage Vitals  Enc Vitals Group     BP 11/30/14 2117 122/105 mmHg     Pulse Rate 11/30/14 2117 72     Resp --      Temp 11/30/14 2117 98.6 F (37 C)     Temp Source 11/30/14 2117 Oral     SpO2 11/30/14 2117 99 %     Weight 11/30/14 2117 168 lb (76.204 kg)     Height 11/30/14 2117 5\' 1"  (1.549 m)     Head Cir --      Peak Flow --      Pain Score 11/30/14 2118 8     Pain Loc --      Pain Edu? --      Excl. in [place]? --     Constitutional: Alert and oriented. Well appearing and in no acute distress. Eyes: Conjunctivae are normal. PERRL. EOMI. Head: Atraumatic. Nose: No congestion/rhinnorhea. Mouth/Throat: Mucous membranes are moist.  Oropharynx non-erythematous. Neck: No stridor.   Cardiovascular: Normal rate, regular rhythm. Grossly normal heart sounds.  Good peripheral circulation. Respiratory: Normal respiratory effort.  No retractions. Lungs CTAB. Gastrointestinal: Soft with tenderness palpation to the suprapubic and right lower quadrants. No distention.  No CVA tenderness. Musculoskeletal: No lower extremity tenderness nor edema.  No joint effusions. Neurologic:  Normal speech and language. No gross focal neurologic deficits are appreciated. Speech is normal. No gait instability. Skin:  Skin is warm, dry and intact. No rash noted. Psychiatric: Mood and affect are normal. Speech and behavior are normal.  ____________________________________________   LABS (all labs ordered are listed, but only abnormal results are displayed)  Labs Reviewed  URINALYSIS COMPLETEWITH MICROSCOPIC ([facility] ONLY) - Abnormal; Notable for the following:    Color, Urine YELLOW (*)    APPearance CLOUDY (*)    Ketones, ur TRACE (*)    Hgb urine dipstick 2+ (*)    Protein, ur 30 (*)    Leukocytes, UA 3+ (*)    Bacteria, UA FEW (*)    Squamous Epithelial / LPF 6-30 (*)    All other components within normal limits  CBC WITH DIFFERENTIAL/PLATELET  BASIC METABOLIC PANEL    ____________________________________________  EKG   ____________________________________________  RADIOLOGY  Pending CAT scan of the abdomen and pelvis. ____________________________________________   PROCEDURES    ____________________________________________   INITIAL IMPRESSION / ASSESSMENT AND PLAN / ED COURSE  Pertinent labs & imaging results that were available during my care  of the patient were reviewed by me and considered in my medical decision making (see chart for details).  ----------------------------------------- 11:42 PM on 11/30/2014 -----------------------------------------  Patient with obvious urinary tract infection. Due to right lower quadrant and flank pain there is also a concern for stone. Pending CAT scan. We will antibiosis with Keflex. We'll give 10 day course for concern for pyelonephritis given right flank pain as well as white blood cell clumps in the urine. Patient to continue Vesicare. Dr. Beather Arbour to follow-up with imaging and disposition accordingly. ____________________________________________   FINAL CLINICAL IMPRESSION(S) / ED DIAGNOSES  Acute pyelonephritis. Acute abdominal and flank pain. Initial visit.    Orbie Pyo, MD 11/30/14 [phone]

## 2014-11-30 NOTE — ED Notes (Signed)
Pt with hx of bladder spasms is here for same.  Pt denies any symptoms of UTI but states that she thinks that she is having bladder spasms again

## 2014-11-30 NOTE — ED Notes (Signed)
Patient c/o bladder spasms and that the medication she is prescribed for them is not working and neither is her pain medications.

## 2014-12-01 DIAGNOSIS — N12 Tubulo-interstitial nephritis, not specified as acute or chronic: Secondary | ICD-10-CM | POA: Diagnosis not present

## 2014-12-01 LAB — BASIC METABOLIC PANEL
ANION GAP: 8 (ref 5–15)
BUN: 8 mg/dL (ref 6–20)
CO2: 26 mmol/L (ref 22–32)
Calcium: 8.8 mg/dL — ABNORMAL LOW (ref 8.9–10.3)
Chloride: 106 mmol/L (ref 101–111)
Creatinine, Ser: 0.6 mg/dL (ref 0.44–1.00)
GFR calc Af Amer: >60 mL/min (ref >60)
GFR calc non Af Amer: >60 mL/min (ref >60)
GLUCOSE: 100 mg/dL — AB (ref 65–99)
Sodium: 140 mmol/L (ref 135–145)

## 2014-12-01 LAB — CBC WITH DIFFERENTIAL/PLATELET
Basophils Absolute: 0 10*3/uL (ref 0–0.1)
Basophils Relative: 1 %
Eosinophils Absolute: 0.3 10*3/uL (ref 0–0.7)
Eosinophils Relative: 4 %
HEMATOCRIT: 39.1 % (ref 35.0–47.0)
Hemoglobin: 13.1 g/dL (ref 12.0–16.0)
LYMPHS ABS: 3.1 10*3/uL (ref 1.0–3.6)
Lymphocytes Relative: 36 %
MCH: 33.7 pg (ref 26.0–34.0)
MCHC: 33.4 g/dL (ref 32.0–36.0)
MCV: 100.9 fL — AB (ref 80.0–100.0)
Monocytes Absolute: 0.8 10*3/uL (ref 0.2–0.9)
Neutro Abs: 4.3 10*3/uL (ref 1.4–6.5)
Platelets: 166 10*3/uL (ref 150–400)
RBC: 3.87 MIL/uL (ref 3.80–5.20)
RDW: 13 % (ref 11.5–14.5)
WBC: 8.6 10*3/uL (ref 3.6–11.0)

## 2014-12-01 LAB — POTASSIUM: Potassium: 3.1 mmol/L — ABNORMAL LOW (ref 3.5–5.1)

## 2014-12-01 MED ORDER — OXYCODONE-ACETAMINOPHEN 5-325 MG PO TABS
1.0000 | ORAL_TABLET | Freq: Once | ORAL | Status: AC
  Administered 2014-12-01: 1 via ORAL

## 2014-12-01 MED ORDER — POTASSIUM CHLORIDE CRYS ER 20 MEQ PO TBCR
EXTENDED_RELEASE_TABLET | ORAL | Status: AC
  Administered 2014-12-01: 40 meq via ORAL
  Filled 2014-12-01: qty 2

## 2014-12-01 MED ORDER — ONDANSETRON HCL 4 MG PO TABS: 4.0000 mg | ORAL_TABLET | Freq: Three times a day (TID) | ORAL | Status: DC | PRN

## 2014-12-01 MED ORDER — HYDROCODONE-ACETAMINOPHEN 5-325 MG PO TABS: 1.0000 | ORAL_TABLET | Freq: Four times a day (QID) | ORAL | Status: DC | PRN

## 2014-12-01 MED ORDER — OXYCODONE-ACETAMINOPHEN 5-325 MG PO TABS
ORAL_TABLET | ORAL | Status: AC
  Administered 2014-12-01: 1 via ORAL
  Filled 2014-12-01: qty 1

## 2014-12-01 MED ORDER — POTASSIUM CHLORIDE CRYS ER 20 MEQ PO TBCR
40.0000 meq | EXTENDED_RELEASE_TABLET | Freq: Once | ORAL | Status: AC
  Administered 2014-12-01: 40 meq via ORAL

## 2014-12-01 NOTE — ED Provider Notes (Signed)
-----------------------------------------   12:43 AM on 12/01/2014 -----------------------------------------  CT abdomen and pelvis without contrast interpreted per Dr. Larey Days: Unremarkable noncontrast CT of the abdomen and pelvis.  Patient resting comfortably without complaints. Updated patient and spouse of CT and lab results. Await potassium level which was redrawn due to hemolysis.  ----------------------------------------- 1:07 AM on 12/01/2014 -----------------------------------------  Mild hypokalemia noted. Will replete in the emergency department. Patient will be discharged home on Keflex and Zofran. She is to follow-up with her PCP early next week. Strict return precautions given. Patient and spouse verbalized understanding and agree with plan of care.  ----------------------------------------- 1:31 AM on 12/01/2014 -----------------------------------------  Of note, patient requested a prescription for pain medicine. She stated that she cannot take hydrocodone. Upon review of the [place] narcotic database, patient had a 30 day supply of Percocet filled on June 15. I have ordered 1 Percocet for the patient to take in the emergency department; no prescription for narcotics was written.  Paulette Blanch, MD 12/03/14 [phone]

## 2014-12-06 ENCOUNTER — Ambulatory Visit: Payer: Medicare Other | Admitting: Pain Medicine

## 2014-12-06 ENCOUNTER — Encounter: Payer: Self-pay | Admitting: Pain Medicine

## 2014-12-21 ENCOUNTER — Ambulatory Visit: Payer: Medicare Other | Attending: Pain Medicine | Admitting: Pain Medicine

## 2014-12-21 ENCOUNTER — Encounter: Payer: Self-pay | Admitting: Pain Medicine

## 2014-12-21 VITALS — BP 111/66 | HR 67 | Temp 98.1°F | Resp 16 | Ht 61.0 in | Wt 168.0 lb

## 2014-12-21 DIAGNOSIS — M47816 Spondylosis without myelopathy or radiculopathy, lumbar region: Secondary | ICD-10-CM | POA: Diagnosis not present

## 2014-12-21 DIAGNOSIS — M533 Sacrococcygeal disorders, not elsewhere classified: Secondary | ICD-10-CM | POA: Insufficient documentation

## 2014-12-21 DIAGNOSIS — M79605 Pain in left leg: Secondary | ICD-10-CM | POA: Diagnosis present

## 2014-12-21 DIAGNOSIS — M65822 Other synovitis and tenosynovitis, left upper arm: Secondary | ICD-10-CM | POA: Diagnosis not present

## 2014-12-21 DIAGNOSIS — G5600 Carpal tunnel syndrome, unspecified upper limb: Secondary | ICD-10-CM | POA: Diagnosis not present

## 2014-12-21 DIAGNOSIS — M5136 Other intervertebral disc degeneration, lumbar region: Secondary | ICD-10-CM | POA: Insufficient documentation

## 2014-12-21 DIAGNOSIS — M1610 Unilateral primary osteoarthritis, unspecified hip: Secondary | ICD-10-CM | POA: Insufficient documentation

## 2014-12-21 DIAGNOSIS — M160 Bilateral primary osteoarthritis of hip: Secondary | ICD-10-CM

## 2014-12-21 DIAGNOSIS — M545 Low back pain: Secondary | ICD-10-CM | POA: Diagnosis present

## 2014-12-21 DIAGNOSIS — M79604 Pain in right leg: Secondary | ICD-10-CM | POA: Diagnosis present

## 2014-12-21 MED ORDER — TIZANIDINE HCL 2 MG PO CAPS: ORAL_CAPSULE | ORAL | Status: DC

## 2014-12-21 MED ORDER — OXYCODONE-ACETAMINOPHEN 5-325 MG PO TABS: ORAL_TABLET | ORAL | Status: DC

## 2014-12-21 NOTE — Patient Instructions (Addendum)
Continue present medications Zanaflex and oxycodone   F/U PCP Dr. Brynda Greathouse for evaliation of  BP and general medical  condition.  F/U surgical evaluation  F/U neurological evaluation  MRI of the right hip. Please ask receptionists date of  your right hip MRI  May consider radiofrequency rhizolysis or intraspinal procedures pending response to present treatment and F/U evaluation.  Patient to call [facility] should patient have concerns prior to scheduled return appointment.  You were given a script for Percocet today. A script for Tizanidine was sent to your pharmacy.

## 2014-12-21 NOTE — Progress Notes (Signed)
Safety precautions to be maintained throughout the outpatient stay will include: orient to surroundings, keep bed in low position, maintain call bell within reach at all times, provide assistance with transfer out of bed and ambulation.  

## 2014-12-21 NOTE — Progress Notes (Signed)
   Subjective:    Patient ID: Janet Murray, female    DOB: 1971-03-24, 44 y.o.   MRN: [id number]  HPI   patient is 44 year old female returns to [place] for further evaluation and treatment of pain involving the lower back and lower extremity region predominantly. Patient states that she has pain involving the region of the right hip. Patient states that the pain is associated with a clicking sensation at times. We discussed patient's condition informed patient that we will proceed with MRI of the hip and will consider patient for further evaluation and treatment pending results of MRI. Patient then instructed to go to emergency room should she develop any symptoms have change in condition prior to scheduled return appointment or follow-up Dr. Brynda Greathouse and call [facility]. The patient was understanding and in agreement with suggested plan. We will continue presently prescribed medication consisting of Zanaflex and oxycodone at this time.     Review of Systems     Objective:   Physical Exam    there was mild tenderness of the splenius capitis and occipitalis musculature region. Palpation of the cervical facet cervical paraspinal musculature region reproduced minimal discomfort. There was minimal tenderness of the thoracic facet thoracic paraspinal musculature region. There was no crepitus of the thoracic region noted. Patient appeared to be with decreased grip strength on the left compared to the right. Tinel and Phalen's maneuver associated with mild to moderate discomfort on the left compared to the right. Palpation thoracic facet thoracic paraspinal must region was with mild tends to palpation. No crepitus of the thoracic region was noted. Palpation over the lumbar paraspinal muscles region lumbar facet region associated with mild to moderate discomfort. Palpation of the PSIS and PII S region reproduced moderate discomfort on the  right compared to the  Left. There appeared  to be positive Patrick's maneuver on the right. There was significant increase of pain with palpation over the PSIS and PII S region. There appeared to be decreased EHL strength. No definite sensory deficit of dermatomal dysesthetic detected. DTRs difficult to elicit patient had difficulty relaxing. Mild tenderness of the greater trochanteric region iliotibial band region. There was mild to moderate increase of pain with pressure prior to the ilium with patient in lateral decubitus position. Abdomen nontender and no costovertebral angle tenderness noted.      Assessment & Plan:     degenerative disc disease lumbar spine  Degenerative changes lumbar spine L4-L5 level involving predominantly   lumbar facet syndrome   Degenerative joint disease of hip   sacroiliac joint dysfunction   Carpal tunnel syndrome    Tenosynovitis of the left upper extremity    Plan  Continue present medications Zanaflex and oxycodone  F/U PCP Dr. Brynda Greathouse for evaliation of  BP and general medical  condition.  F/U surgical evaluation  F/U neurological evaluation  MRI of the right hip degenerative to  evaluate for degenerative joint disease of hip and other abnormalities  May consider radiofrequency rhizolysis or intraspinal procedures pending response to present treatment and F/U evaluation.  Patient to call [facility] should patient have concerns prior to scheduled return appointment.

## 2015-01-09 ENCOUNTER — Other Ambulatory Visit: Payer: Self-pay | Admitting: Pain Medicine

## 2015-01-09 ENCOUNTER — Ambulatory Visit
Admission: RE | Admit: 2015-01-09 | Discharge: 2015-01-09 | Disposition: A | Discharge status: Home / Self Care | Payer: Medicare Other | Source: Ambulatory Visit | Attending: Pain Medicine | Admitting: Pain Medicine

## 2015-01-09 DIAGNOSIS — M545 Low back pain: Secondary | ICD-10-CM | POA: Insufficient documentation

## 2015-01-09 DIAGNOSIS — M47816 Spondylosis without myelopathy or radiculopathy, lumbar region: Secondary | ICD-10-CM

## 2015-01-09 DIAGNOSIS — M160 Bilateral primary osteoarthritis of hip: Secondary | ICD-10-CM

## 2015-01-09 DIAGNOSIS — M5136 Other intervertebral disc degeneration, lumbar region: Secondary | ICD-10-CM | POA: Insufficient documentation

## 2015-01-09 DIAGNOSIS — M533 Sacrococcygeal disorders, not elsewhere classified: Secondary | ICD-10-CM

## 2015-01-10 ENCOUNTER — Other Ambulatory Visit: Payer: Self-pay | Admitting: Pain Medicine

## 2015-01-10 DIAGNOSIS — M5481 Occipital neuralgia: Secondary | ICD-10-CM

## 2015-01-10 DIAGNOSIS — M5416 Radiculopathy, lumbar region: Secondary | ICD-10-CM

## 2015-01-10 DIAGNOSIS — M533 Sacrococcygeal disorders, not elsewhere classified: Secondary | ICD-10-CM

## 2015-01-10 DIAGNOSIS — G2581 Restless legs syndrome: Secondary | ICD-10-CM

## 2015-01-10 DIAGNOSIS — G609 Hereditary and idiopathic neuropathy, unspecified: Secondary | ICD-10-CM

## 2015-01-10 DIAGNOSIS — M5136 Other intervertebral disc degeneration, lumbar region: Secondary | ICD-10-CM

## 2015-01-10 DIAGNOSIS — M4726 Other spondylosis with radiculopathy, lumbar region: Secondary | ICD-10-CM

## 2015-01-10 DIAGNOSIS — M47816 Spondylosis without myelopathy or radiculopathy, lumbar region: Secondary | ICD-10-CM

## 2015-01-10 DIAGNOSIS — G5602 Carpal tunnel syndrome, left upper limb: Secondary | ICD-10-CM

## 2015-01-23 ENCOUNTER — Ambulatory Visit: Payer: Medicare Other | Attending: Pain Medicine | Admitting: Pain Medicine

## 2015-01-23 ENCOUNTER — Encounter: Payer: Self-pay | Admitting: Pain Medicine

## 2015-01-23 VITALS — BP 112/71 | HR 72 | Temp 98.0°F | Resp 16 | Ht 61.0 in | Wt 178.0 lb

## 2015-01-23 DIAGNOSIS — M4806 Spinal stenosis, lumbar region: Secondary | ICD-10-CM | POA: Diagnosis not present

## 2015-01-23 DIAGNOSIS — M65822 Other synovitis and tenosynovitis, left upper arm: Secondary | ICD-10-CM | POA: Diagnosis not present

## 2015-01-23 DIAGNOSIS — M5136 Other intervertebral disc degeneration, lumbar region: Secondary | ICD-10-CM | POA: Diagnosis not present

## 2015-01-23 DIAGNOSIS — M533 Sacrococcygeal disorders, not elsewhere classified: Secondary | ICD-10-CM | POA: Insufficient documentation

## 2015-01-23 DIAGNOSIS — M5481 Occipital neuralgia: Secondary | ICD-10-CM

## 2015-01-23 DIAGNOSIS — G5600 Carpal tunnel syndrome, unspecified upper limb: Secondary | ICD-10-CM | POA: Diagnosis not present

## 2015-01-23 DIAGNOSIS — G2581 Restless legs syndrome: Secondary | ICD-10-CM

## 2015-01-23 DIAGNOSIS — M545 Low back pain: Secondary | ICD-10-CM | POA: Diagnosis present

## 2015-01-23 DIAGNOSIS — M47816 Spondylosis without myelopathy or radiculopathy, lumbar region: Secondary | ICD-10-CM

## 2015-01-23 DIAGNOSIS — M79605 Pain in left leg: Secondary | ICD-10-CM | POA: Diagnosis present

## 2015-01-23 DIAGNOSIS — M4726 Other spondylosis with radiculopathy, lumbar region: Secondary | ICD-10-CM

## 2015-01-23 DIAGNOSIS — G5602 Carpal tunnel syndrome, left upper limb: Secondary | ICD-10-CM

## 2015-01-23 DIAGNOSIS — M79604 Pain in right leg: Secondary | ICD-10-CM | POA: Diagnosis present

## 2015-01-23 DIAGNOSIS — M5416 Radiculopathy, lumbar region: Secondary | ICD-10-CM

## 2015-01-23 DIAGNOSIS — G609 Hereditary and idiopathic neuropathy, unspecified: Secondary | ICD-10-CM

## 2015-01-23 MED ORDER — OXYCODONE-ACETAMINOPHEN 5-325 MG PO TABS: ORAL_TABLET | ORAL | Status: DC

## 2015-01-23 MED ORDER — TIZANIDINE HCL 2 MG PO CAPS: ORAL_CAPSULE | ORAL | Status: DC

## 2015-01-23 NOTE — Progress Notes (Signed)
Safety precautions to be maintained throughout the outpatient stay will include: orient to surroundings, keep bed in low position, maintain call bell within reach at all times, provide assistance with transfer out of bed and ambulation.  

## 2015-01-23 NOTE — Patient Instructions (Addendum)
Continue present medications Zanaflex and oxycodone  Block of nerves to the sacroiliac joint to be performed at time return appointment  F/U PCP Dr. Brynda Greathouse for evaliation of  BP and general medical  condition  F/U surgical evaluation as discussed  Please ask Caryl Pina the date of your surgical about of your right lower quadrant and inguinal pain   F/U neurological evaluation  May consider radiofrequency rhizolysis or intraspinal procedures pending response to present treatment and F/U evaluation   Patient to call [facility] should patient have concerns prior to scheduled return appointmen. Sacroiliac (SI) Joint Injection Patient Information  Description: The sacroiliac joint connects the scrum (very low back and tailbone) to the ilium (a pelvic bone which also forms half of the hip joint).  Normally this joint experiences very little motion.  When this joint becomes inflamed or unstable low back and or hip and pelvis pain may result.  Injection of this joint with local anesthetics (numbing medicines) and steroids can provide diagnostic information and reduce pain.  This injection is performed with the aid of x-ray guidance into the tailbone area while you are lying on your stomach.   You may experience an electrical sensation down the leg while this is being done.  You may also experience numbness.  We also may ask if we are reproducing your normal pain during the injection.  Conditions which may be treated SI injection:   Low back, buttock, hip or leg pain  Preparation for the Injection:  1. Do not eat any solid food or dairy products within 6 hours of your appointment.  2. You may drink clear liquids up to 2 hours before appointment.  Clear liquids include water, black coffee, juice or soda.  No milk or cream please. 3. You may take your regular medications, including pain medications with a sip of water before your appointment.  Diabetics should hold regular insulin (if take  separately) and take 1/2 normal NPH dose the morning of the procedure.  Carry some sugar containing items with you to your appointment. 4. A driver must accompany you and be prepared to drive you home after your procedure. 5. Bring all of your current medications with you. 6. An IV may be inserted and sedation may be given at the discretion of the physician. 7. A blood pressure cuff, EKG and other monitors will often be applied during the procedure.  Some patients may need to have extra oxygen administered for a short period.  8. You will be asked to provide medical information, including your allergies, prior to the procedure.  We must know immediately if you are taking blood thinners (like Coumadin/Warfarin) or if you are allergic to IV iodine contrast (dye).  We must know if you could possible be pregnant.  Possible side effects:   Bleeding from needle site  Infection (rare, may require surgery)  Nerve injury (rare)  Numbness & tingling (temporary)  A brief convulsion or seizure  Light-headedness (temporary)  Pain at injection site (several days)  Decreased blood pressure (temporary)  Weakness in the leg (temporary)   Call if you experience:   New onset weakness or numbness of an extremity below the injection site that last more than 8 hours.  Hives or difficulty breathing ( go to the emergency room)  Inflammation or drainage at the injection site  Any new symptoms which are concerning to you  Please note:  Although the local anesthetic injected can often make your back/ hip/ buttock/ leg feel good  for several hours after the injections, the pain will likely return.  It takes 3-7 days for steroids to work in the sacroiliac area.  You may not notice any pain relief for at least that one week.  If effective, we will often do a series of three injections spaced 3-6 weeks apart to maximally decrease your pain.  After the initial series, we generally will wait some months  before a repeat injection of the same type.  If you have any questions, please call [phone] [facility]

## 2015-01-23 NOTE — Progress Notes (Signed)
Subjective:    Patient ID: Janet Murray, female    DOB: [birth year], 44 y.o.   MRN: [id number]  HPI   patient is 44 year old female returns to [place] for further evaluation and treatment of pain involving the lower back and lower extremity region predominantly. We reviewed MRI of the hips on today's visit and patient was noted to be with significant sacroiliac joint disease. We will proceed with block of nerves to the sacroiliac joint at time return appointment in attempt to decrease severely disabling pain occurring in the lower back buttocks and region of the hip. She also with complaint of pain occurring in the inguinal region. We discussed patient's condition and will have patient undergo surgical evaluation of right lower quadrant and right inguinal pain. There is concern regarding tendinitis without strong findings suggestive of herniation or other abnormalities. We will proceed with surgical evaluation and will consider patient for additional modification of treatment of the right lower quadrant and right inguinal region as discussed and explained to patient. At the present time we will proceed with block of nerves to the sacroiliac joint at time return appointment attempt to decrease severity of symptoms, minimize progression of symptoms, and avoid need for more involved treatment. The patient will continue  Zanaflex and oxycodone at this time. The patient was understanding and in agreement suggested plan    Review of Systems     Objective:   Physical Exam   There was  Tenderness to palpationover the cervical facet cervical paraspinal musculature region as well as the thoracic facet and thoracic paraspinal muscles of mild to moderate degree There was tenderness of the splenius capitis and occipitalis musculature region of mild degree. There appeared to be unremarkable Spurling's maneuver  Patient appeared to be with decreased grip strength on the left compared to the right.  Palpation over the thoracic facet thoracic paraspinal musculature muscles region was with evidence of muscle spasm in the lower thoracic region. Palpation over the lumbar paraspinal muscular region lumbar facet region was with increased pain with lateral bending and rotation reproducing pain of moderate degree on the right compared to the left there was tenderness of moderate to moderately severe degree over the PSIS and PII S regions. There was increased pain with pressure prior to the ilium with patient in lateral decubitus position. There was mild tinnitus of the greater trochanteric region iliotibial band region. Straight leg raising was tolerates approximately 20 without increased pain with dorsiflexion noted. Patrick's maneuver associated increased pain occurring in the region of the buttocks. There was tends to palpation of the right lower quadrant and right inguinal region with  With palpation over will be muscular structure  Appeared to reproduce patient's pain. No definite masses were noted.  There was no costovertebral angle tenderness noted.      Assessment & Plan:    Degenerative disc disease lumbar spine L4-L5 level degenerative changes noted on prior MRI  Lumbar stenosis with neurogenic claudication  Lumbar facet syndrome  Carpal tunnel syndrome  Tenosynovitis of the left upper extremity  Sacroiliac joint disease      Plan  Continue present medications Zanaflex and oxycodone   Block of nerves to the sacroiliac joint to be performed at time of return appointment  F/U PCP for evaliation of  BP and general medical  condition  F/U surgical evaluation we discussed surgical evaluation of the right lower quadrant abdominal pain and the right inguinal pain and we will proceed with scheduling patient for  surgical evaluation in this regard  F/U neurological evaluation  May consider radiofrequency rhizolysis or intraspinal procedures pending response to present treatment and  F/U evaluation   Patient to call [facility] Center should patient have concerns prior to scheduled return appointmen.

## 2015-01-29 ENCOUNTER — Encounter: Payer: Self-pay | Admitting: Pain Medicine

## 2015-01-29 ENCOUNTER — Ambulatory Visit: Payer: Medicare Other | Attending: Pain Medicine | Admitting: Pain Medicine

## 2015-01-29 VITALS — BP 98/55 | HR 56 | Temp 98.2°F | Resp 16 | Ht 61.0 in | Wt 168.0 lb

## 2015-01-29 DIAGNOSIS — M4726 Other spondylosis with radiculopathy, lumbar region: Secondary | ICD-10-CM

## 2015-01-29 DIAGNOSIS — M5136 Other intervertebral disc degeneration, lumbar region: Secondary | ICD-10-CM

## 2015-01-29 DIAGNOSIS — M5416 Radiculopathy, lumbar region: Secondary | ICD-10-CM

## 2015-01-29 DIAGNOSIS — G2581 Restless legs syndrome: Secondary | ICD-10-CM

## 2015-01-29 DIAGNOSIS — M545 Low back pain: Secondary | ICD-10-CM | POA: Diagnosis present

## 2015-01-29 DIAGNOSIS — M5481 Occipital neuralgia: Secondary | ICD-10-CM

## 2015-01-29 DIAGNOSIS — M79605 Pain in left leg: Secondary | ICD-10-CM | POA: Diagnosis present

## 2015-01-29 DIAGNOSIS — M533 Sacrococcygeal disorders, not elsewhere classified: Secondary | ICD-10-CM

## 2015-01-29 DIAGNOSIS — M51369 Other intervertebral disc degeneration, lumbar region without mention of lumbar back pain or lower extremity pain: Secondary | ICD-10-CM

## 2015-01-29 DIAGNOSIS — G609 Hereditary and idiopathic neuropathy, unspecified: Secondary | ICD-10-CM

## 2015-01-29 DIAGNOSIS — M47816 Spondylosis without myelopathy or radiculopathy, lumbar region: Secondary | ICD-10-CM | POA: Diagnosis not present

## 2015-01-29 DIAGNOSIS — G5602 Carpal tunnel syndrome, left upper limb: Secondary | ICD-10-CM

## 2015-01-29 DIAGNOSIS — M79604 Pain in right leg: Secondary | ICD-10-CM | POA: Diagnosis present

## 2015-01-29 MED ORDER — BUPIVACAINE HCL (PF) 0.25 % IJ SOLN
INTRAMUSCULAR | Status: AC
  Administered 2015-01-29: 20 mL
  Filled 2015-01-29: qty 30

## 2015-01-29 MED ORDER — ORPHENADRINE CITRATE 30 MG/ML IJ SOLN
INTRAMUSCULAR | Status: AC
  Administered 2015-01-29: 30 mg
  Filled 2015-01-29: qty 2

## 2015-01-29 MED ORDER — MIDAZOLAM HCL 5 MG/5ML IJ SOLN
INTRAMUSCULAR | Status: AC
  Administered 2015-01-29: 4 mg via INTRAVENOUS
  Filled 2015-01-29: qty 5

## 2015-01-29 MED ORDER — TRIAMCINOLONE ACETONIDE 40 MG/ML IJ SUSP
INTRAMUSCULAR | Status: AC
  Administered 2015-01-29: 40 mg
  Filled 2015-01-29: qty 1

## 2015-01-29 MED ORDER — FENTANYL CITRATE (PF) 100 MCG/2ML IJ SOLN
INTRAMUSCULAR | Status: AC
  Administered 2015-01-29: 100 ug via INTRAVENOUS
  Filled 2015-01-29: qty 2

## 2015-01-29 NOTE — Progress Notes (Signed)
Subjective:    Patient ID: Janet Murray, female    DOB: [birth year], 44 y.o.   MRN: [id number]  HPI  PROCEDURE:  Block of nerves to the sacroiliac joint.   NOTE:  The patient is a 44 y.o. female who [facility] the [facility] for further evaluation and treatment of pain involving the lower back and lower extremity region with pain in the region of the buttocks as well. Prior  MRI studies reveal  Multilevel degenerative changes of the lumbar spine with L4-L5 level involving predominantly. . The patient is a severe disabling pain with palpation over the PSIS and PII S regions and patient is with positive Patrick's maneuver is well There is concern regarding a significant component of the patient's pain being due to sacroiliac joint dysfunction The risks, benefits, expectations of the procedure have been discussed and explained to the patient who is understanding and willing to proceed with interventional treatment in attempt to decrease severity of patient's symptoms, minimize the risk of medication escalation and  hopefully retard the progression of the patient's symptoms. We will proceed with what is felt to be a medically necessary procedure, block of nerves to the sacroiliac joint.   DESCRIPTION OF PROCEDURE:  Block of nerves to the sacroiliac joint.   The patient was taken to the fluoroscopy suite. With the patient in the prone position with EKG, blood pressure, pulse and pulse oximetry monitoring, IV Versed, IV fentanyl conscious sedation, Betadine prep of proposed entry site was performed.   Block of nerves at the L5 vertebral body level.   With the patient in prone position, under fluoroscopic guidance, a 22 -gauge needle was inserted at the L5 vertebral body level on the  Left  side. With 15 degrees oblique orientation a 22 -gauge needle was inserted in the region known as Burton's eye or eye of the Scotty dog. Following documentation of needle placement in the area of Burton's eye  or eye of the Scotty dog under fluoroscopic guidance, needle placement was then accomplished at the sacral ala level on the  left side.   Needle placement at the sacral ala.   With the patient in prone position under fluoroscopic guidance with AP view of the lumbosacral spine, a 22 -gauge needle was inserted in the region known as the sacral ala on the  left side. Following documentation of needle placement on the  left side under fluoroscopic guidance needle placement was then accomplished at the S1 foramen level.   Needle placement at the S1 foramen level.   With the patient in prone position under fluoroscopic guidance with AP view of the lumbosacral spine and cephalad orientation, a 22 -gauge needle was inserted at the superior and lateral border of the S1 foramen on the  left side. Following documentation of needle placement at the S1 foramen level on the  left side, needle placement was then accomplished at the S2 foramen level on the  left side.   Needle placement at the S2 foramen level.   With the patient in prone position with AP view of the lumbosacral spine with cephalad orientation, a 22 - gauge needle was inserted at the superior and lateral border of the S2 foramen under fluoroscopic guidance on the  left side. Following needle placement at the L5 vertebral body level, sacral ala, S1 foramen and S2 foramen on the  left side, needle placement was verified on lateral view under fluoroscopic guidance.  Following needle placement documentation on lateral view,  each needle was injected with 1 mL of 0.25% bupivacaine and Kenalog.   BLOCK OF THE NERVES TO SACROILIAC JOINT ON THE RIGHT SIDE The procedure was performed on the right side at the same levels as was performed on the left side and utilizing the same technique as on the left side and was performed under fluoroscopic guidance as on the left side   A total of 10mg  of Kenalog was utilized for the procedure.    PLAN:  1. Medications: The patient will continue presently prescribed medications Zanaflex and oxycodone.  2. The patient will be considered for modification of treatment regimen pending response to the procedure performed on today's visit.  3. The patient is to follow-up with primary care physician Dr. Brynda Greathouse for evaluation of blood pressure and general medical condition following the procedure performed on today's visit.  4. Surgical evaluation as discussed.  5. Neurological evaluation as discussed.  6. The patient may be a candidate for radiofrequency procedures, implantation devices and other treatment pending response to treatment performed on today's visit and follow-up evaluation.  7. The patient has been advised to adhere to proper body mechanics and to avoid activities which may exacerbate the patient's symptoms.   Return appointment to [facility] as scheduled.      Review of Systems     Objective:   Physical Exam        Assessment & Plan:

## 2015-01-29 NOTE — Patient Instructions (Addendum)
Continue present medications Zanaflex and oxycodone  F/U PCP Dr. Brynda Greathouse for evaliation of  BP and general medical  condition  F/U surgical evaluation  F/U neurological evaluation  May consider radiofrequency rhizolysis or intraspinal procedures pending response to present treatment and F/U evaluation   Patient to call [facility] Center should patient have concerns prior to scheduled return appointmen. Pain Management Discharge Instructions  General Discharge Instructions :  If you need to reach your doctor call: Monday-Friday 8:00 am - 4:00 pm at [phone] or toll free [phone].  After clinic hours [phone] to have operator reach doctor.  Bring all of your medication bottles to all your appointments in the pain clinic.  To cancel or reschedule your appointment with Pain Management please remember to call 24 hours in advance to avoid a fee.  Refer to the educational materials which you have been given on: General Risks, I had my Procedure. Discharge Instructions, Post Sedation.  Post Procedure Instructions:  The drugs you were given will stay in your system until tomorrow, so for the next 24 hours you should not drive, make any legal decisions or drink any alcoholic beverages.  You may eat anything you prefer, but it is better to start with liquids then soups and crackers, and gradually work up to solid foods.  Please notify your doctor immediately if you have any unusual bleeding, trouble breathing or pain that is not related to your normal pain.  Depending on the type of procedure that was done, some parts of your body may feel week and/or numb.  This usually clears up by tonight or the next day.  Walk with the use of an assistive device or accompanied by an adult for the 24 hours.  You may use ice on the affected area for the first 24 hours.  Put ice in a Ziploc bag and cover with a towel and place against area 15 minutes on 15 minutes off.  You may switch to heat  after 24 hours.

## 2015-01-29 NOTE — Progress Notes (Signed)
Safety precautions to be maintained throughout the outpatient stay will include: orient to surroundings, keep bed in low position, maintain call bell within reach at all times, provide assistance with transfer out of bed and ambulation.  

## 2015-01-30 ENCOUNTER — Telehealth: Payer: Self-pay | Admitting: *Deleted

## 2015-01-30 NOTE — Telephone Encounter (Signed)
Denies complications post procedure. 

## 2015-02-08 ENCOUNTER — Other Ambulatory Visit: Payer: Self-pay | Admitting: Pain Medicine

## 2015-02-22 ENCOUNTER — Encounter: Payer: Self-pay | Admitting: Pain Medicine

## 2015-02-22 ENCOUNTER — Ambulatory Visit: Payer: Medicare Other | Attending: Pain Medicine | Admitting: Pain Medicine

## 2015-02-22 VITALS — BP 111/72 | HR 66 | Temp 98.5°F | Resp 16 | Ht 61.0 in | Wt 170.0 lb

## 2015-02-22 DIAGNOSIS — G5600 Carpal tunnel syndrome, unspecified upper limb: Secondary | ICD-10-CM | POA: Insufficient documentation

## 2015-02-22 DIAGNOSIS — M4726 Other spondylosis with radiculopathy, lumbar region: Secondary | ICD-10-CM

## 2015-02-22 DIAGNOSIS — M659 Synovitis and tenosynovitis, unspecified: Secondary | ICD-10-CM | POA: Diagnosis not present

## 2015-02-22 DIAGNOSIS — M545 Low back pain: Secondary | ICD-10-CM | POA: Diagnosis present

## 2015-02-22 DIAGNOSIS — G2581 Restless legs syndrome: Secondary | ICD-10-CM

## 2015-02-22 DIAGNOSIS — M160 Bilateral primary osteoarthritis of hip: Secondary | ICD-10-CM

## 2015-02-22 DIAGNOSIS — G609 Hereditary and idiopathic neuropathy, unspecified: Secondary | ICD-10-CM

## 2015-02-22 DIAGNOSIS — M4806 Spinal stenosis, lumbar region: Secondary | ICD-10-CM | POA: Insufficient documentation

## 2015-02-22 DIAGNOSIS — G5602 Carpal tunnel syndrome, left upper limb: Secondary | ICD-10-CM

## 2015-02-22 DIAGNOSIS — M1610 Unilateral primary osteoarthritis, unspecified hip: Secondary | ICD-10-CM | POA: Insufficient documentation

## 2015-02-22 DIAGNOSIS — M5136 Other intervertebral disc degeneration, lumbar region: Secondary | ICD-10-CM | POA: Diagnosis not present

## 2015-02-22 DIAGNOSIS — M47816 Spondylosis without myelopathy or radiculopathy, lumbar region: Secondary | ICD-10-CM

## 2015-02-22 DIAGNOSIS — M5416 Radiculopathy, lumbar region: Secondary | ICD-10-CM

## 2015-02-22 DIAGNOSIS — M5481 Occipital neuralgia: Secondary | ICD-10-CM

## 2015-02-22 DIAGNOSIS — M533 Sacrococcygeal disorders, not elsewhere classified: Secondary | ICD-10-CM | POA: Diagnosis not present

## 2015-02-22 MED ORDER — TIZANIDINE HCL 2 MG PO CAPS: ORAL_CAPSULE | ORAL | Status: DC

## 2015-02-22 MED ORDER — OXYCODONE-ACETAMINOPHEN 5-325 MG PO TABS: ORAL_TABLET | ORAL | Status: DC

## 2015-02-22 NOTE — Patient Instructions (Addendum)
PLAN   Continue present medication Zanaflex and oxycodone   F/U PCP Dr. Brynda Greathouse  for evaliation of  BP and general medical  condition  F/U surgical evaluation. Ask Caryl Pina and nurses the date of your surgical evaluation of lower back pain  hip pain and lower extremity pain  F/U neurological evaluation. May consider pending follow-up evaluations  Ask Caryl Pina and nurses the date of your [facility] EMG studies of the lower extremities and date of surgical evaluation of lower back and hip and lower extremity pain  May consider radiofrequency rhizolysis or intraspinal procedures pending response to present treatment and F/U evaluation   Patient to call [facility] should patient have concerns prior to scheduled return appointment.

## 2015-02-22 NOTE — Progress Notes (Signed)
Safety precautions to be maintained throughout the outpatient stay will include: orient to surroundings, keep bed in low position, maintain call bell within reach at all times, provide assistance with transfer out of bed and ambulation.  

## 2015-02-22 NOTE — Progress Notes (Signed)
   Subjective:    Patient ID: Janet Murray, female    DOB: January 24, 1971, 44 y.o.   MRN: [id number]  HPI  Patient is 43 year old female who returns to [facility] for further evaluation and treatment of pain involving the region of the lower back lower extremity region including the hips especially. Patient states that pain is aggravated by standing walking twisting turning maneuvers with pain becoming more intense as the day progresses. Patient patient states that the pain interferes with ability to obtain restful sleep as well at the present time we will proceed with further evaluation including surgical evaluation and will obtain [facility] EMG studies for further assessment of lower back and lower extremity pain. The patient was in agreement with suggested treatment plan we will continue Zanaflex and oxycodone as prescribed at this time.     Review of Systems     Objective:   Physical Exam  There was tenderness of the spinous Occipitalis region of mild degree. There was mild tinnitus of the cervical facet cervical paraspinal musculature region as well as the thoracic facet thoracic paraspinal musculature region. There is a crepitus of the thoracic region noted. Palpation of the acromioclavicular and glenohumeral joint region with minimal tenderness to palpation. Appear to be unremarkable Spurling's maneuver. Patient was with decreased grip strength noted with no definite increase of pain with Spurling's maneuver. Tinel and Phalen's maneuver was associated with moderate discomfort. Palpation over the lumbar paraspinal muscles lumbar facet region associated with a tends to palpation of moderate degree. Lateral bending and rotation and extension and palpation of the lumbar facets reproduce moderate discomfort. Moderate to moderately severe tenderness of the greater trochanteric region iliotibial band region as well as the PSIS and PII S regions. Straight leg raising limited to approximately 20  without increased pain with dorsiflexion noted and with negative clonus negative Homans. No definite sensory deficit of dermatomal distribution detected. Patient appeared to be with positive Patrick's maneuver. EHL strength was decreased with no definite sensory deficit of dermatomal distribution detected. There was increase of pain with pressure prior to the ilium with patient in lateral decubitus position. There was negative clonus negative Homans. Abdomen nontender with no costovertebral tenderness noted.    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 Brynda Greathouse for evaliation of  BP and general medical  condition  F/U surgical evaluation. Patient schedule surgical evaluation of lower back lower extremity pain as well as pain of the hip  F/U neurological evaluation. Patient was scheduled for [facility] EMG studies to evaluate lumbar lower extremity pain paresthesias  May consider radiofrequency rhizolysis or intraspinal procedures pending response to present treatment and F/U evaluation   Patient to call [facility] Center should patient have concerns prior to scheduled return appointment.

## 2015-02-28 ENCOUNTER — Ambulatory Visit: Payer: Medicare Other | Attending: Pain Medicine | Admitting: Pain Medicine

## 2015-02-28 ENCOUNTER — Encounter: Payer: Self-pay | Admitting: Pain Medicine

## 2015-02-28 VITALS — BP 109/56 | HR 67 | Temp 98.2°F | Resp 16 | Ht 61.0 in | Wt 170.0 lb

## 2015-02-28 DIAGNOSIS — M545 Low back pain: Secondary | ICD-10-CM | POA: Diagnosis present

## 2015-02-28 DIAGNOSIS — G5602 Carpal tunnel syndrome, left upper limb: Secondary | ICD-10-CM

## 2015-02-28 DIAGNOSIS — M4726 Other spondylosis with radiculopathy, lumbar region: Secondary | ICD-10-CM

## 2015-02-28 DIAGNOSIS — M79605 Pain in left leg: Secondary | ICD-10-CM | POA: Diagnosis present

## 2015-02-28 DIAGNOSIS — M79604 Pain in right leg: Secondary | ICD-10-CM | POA: Diagnosis present

## 2015-02-28 DIAGNOSIS — M5416 Radiculopathy, lumbar region: Secondary | ICD-10-CM

## 2015-02-28 DIAGNOSIS — M5136 Other intervertebral disc degeneration, lumbar region: Secondary | ICD-10-CM | POA: Diagnosis not present

## 2015-02-28 DIAGNOSIS — M160 Bilateral primary osteoarthritis of hip: Secondary | ICD-10-CM

## 2015-02-28 DIAGNOSIS — G2581 Restless legs syndrome: Secondary | ICD-10-CM

## 2015-02-28 DIAGNOSIS — G609 Hereditary and idiopathic neuropathy, unspecified: Secondary | ICD-10-CM

## 2015-02-28 DIAGNOSIS — M533 Sacrococcygeal disorders, not elsewhere classified: Secondary | ICD-10-CM

## 2015-02-28 DIAGNOSIS — M47816 Spondylosis without myelopathy or radiculopathy, lumbar region: Secondary | ICD-10-CM | POA: Insufficient documentation

## 2015-02-28 DIAGNOSIS — M5481 Occipital neuralgia: Secondary | ICD-10-CM

## 2015-02-28 MED ORDER — FENTANYL CITRATE (PF) 100 MCG/2ML IJ SOLN
INTRAMUSCULAR | Status: AC
  Administered 2015-02-28: 100 ug via INTRAVENOUS
  Filled 2015-02-28: qty 2

## 2015-02-28 MED ORDER — LIDOCAINE HCL (PF) 1 % IJ SOLN
INTRAMUSCULAR | Status: AC
  Administered 2015-02-28: 13:00:00
  Filled 2015-02-28: qty 10

## 2015-02-28 MED ORDER — MIDAZOLAM HCL 5 MG/5ML IJ SOLN
INTRAMUSCULAR | Status: AC
  Administered 2015-02-28: 3 mg via INTRAVENOUS
  Filled 2015-02-28: qty 5

## 2015-02-28 NOTE — Progress Notes (Signed)
Subjective:    Patient ID: Janet Murray, female    DOB: Oct 08, 1970, 44 y.o.   MRN: [id number]  HPI  PROCEDURE PERFORMED: Radiofrequency rhizolysis (lunbar facet medial branch nerve radiofrequency rhizolysis).  The procedure was performed with the COOLIEF technique.  COOLIEF technique was performed using the Synergy cooled radiofrequency probe [id number]-17-150-4, the synergy cooled radiofrequency introducer SII-17-150, and the synergy cooled sterile tube kit [id number].   HISTORY: The patient is a 44 y.o. female who returns to the [facility] for further evaluation of severely disabling pain involving the lumbar and lower extremity region.  MRI revealed degenerative disc disease lumbar spine L4-L5 level degenerative changes noted on prior MRI   There is concern regarding significant component of patient's pain being due to facet arthropathy with facet syndrome. The patient has had significant relief of pain following previous lumbar facet, medial branch nerve, blocks. Radiofrequency rhizolysis of the lumbar facets, medial branch nerves, will be performed at this time an attempt to provide longer lasting relief of the patient's pain, minimize progression of the patient's symptoms, and avoid the need for more involved treatment. The risks, benefits and expectations of the procedure have been discussed and explained to the patient who was with understanding and wished to proceed with interventional treatment in an attempt to decrease severely  disabling pain of the lumbar and lower extremity region.  We will proceed with what is felt to be a medically necessary procedure, radiofrequency rhizolysis (lumber facet medial branch nerve radiofrequency rhizolysis) using the COOLIEF technique.  DESCRIPTION OF PROCEDURE: COOLIEF Technique Radiofrequency Rhizolysis of Lumbar Facets (Medial Branch Nerves Radiofrequency Rhizolysis) with IV Versed, IV Fentanyl conscious sedation, EKG, blood pressure, pulse, and  pulse oximetry monitoring.  The procedure was performed with the patient in the prone position under fluoroscopic guidance.  Following Betadine prep of the proposed entry site, a 1.5% lidocaine plain skin wheal of the proposed needle entry site was  prepared in oblique orientation.   right, L5 Radiofrequency Rhizolysis L5 Facet  (Medial Branch Nerve): Under fluoroscopic guidance, the radiofrequency needle was inserted at the L5 vertebral body level after a local anesthetic skin wheal of 1.5% lidocaine plain and Betadine prep of the proposed needle entry site was performed.  The needle was inserted under fluoroscopic guidance in the area known as Burton's eye or eye of the Scotty dog with needle placed at the superior and lateral border of the targeted area with oblique orientation utilizing the tunnel (gun  barrel approach) technique to the targeted area.  Right  Radiofrequency Rhizolysis at L4 and L3  Lumbar Facets (Medial Branch Nerves) The procedure was performed at L4 and L3 exactly as was performed at the L5 and under fluoroscopic guidance utilizing the identical technique as utilized at the L5 vertebral body level.  Needle was then verified on lateral view and following needle placement verification on lateral view, radiofrequency testing was then carried out with motor testing at 2 Hz stimulation without evidence of stimulation of the lower extremity. Following radiofrequency testing for motor testing, each radiofrequency probe was then prepared with 2 mL of preservative-free lidocaine and following 1 minute of anesthetizing each proposed radiofrequency lesioning  Level, the radiofrequency probe was then inserted in the right, L5 vertebral body level needle with radiofrequency lesioning carried out for 2-1/2 minutes with temperature of the tissue being maintained at 80 degrees centigrade for 2-1/2 minutes.  Radiofrequency probe was then inserted in the right, L4 vertebral body level needle and  radiofrequency lesioning  was performed at 80 degrees centigrade tissue temperature for 2-1/2 minutes.  Radiofrequency probe was then inserted in the right,L2-3 vertebral body level needle and radiofrequency lesioning was performed at 80 degrees centigrade tissue temperature for 2-1/2 minutes  The patient tolerated the procedure well.   PLAN: 1. Medications: The patient will continue the present medications.oxycodone and Zanaflex 2. The patient will follow up with Dr. Brynda Greathouse regarding blood pressure and general medical condition as discussed with the patient.   3. Surgical evaluation as discussed. 4. Neurological evaluation as discussed.  5. The patient has been advised to call the [facility] prior to scheduled return appointment should there be significant change in the patient's condition or should the patient have other concerns regarding condition prior to scheduled return appointment.  The patient is with understanding and is in agreement with suggested treatment plan.    Review of Systems     Objective:   Physical Exam        Assessment & Plan:

## 2015-02-28 NOTE — Patient Instructions (Addendum)
PLAN   Continue present medication Zanaflex and oxycodone  F/U PCP Dr. Brynda Greathouse for evaliation of  BP and general medical  condition  F/U surgical evaluation. May consider pending follow-up evaluations as discussed  F/U neurological evaluation. May consider PNCV/EMG studies and other studies as discussed  May consider radiofrequency rhizolysis or intraspinal procedures pending response to present treatment and F/U evaluation   Patient to call [facility] should patient have concerns prior to scheduled return appointment. Radiofrequency Lesioning Radiofrequency lesioning is a procedure to relieve pain. The procedure is often used for back, neck, or arm pain. Radiofrequency lesioning uses a specialized machine that creates radio waves to make heat. The heat damages the nerve that carries the pain signal. Pain relief usually lasts 6 months to 1 year.  LET YOUR CAREGIVER KNOW ABOUT:  Allergies to food or medicine.  Medicines taken, including vitamins, herbs, eyedrops, over-the-counter medicines, and creams.  Use of steroids (by mouth or creams).  Previous problems with anesthetics or numbing medicines.  History of bleeding problems or blood clots.  Previous surgery.  Other health problems, including diabetes and kidney problems.  Possibility of pregnancy, if this applies.  Breathing problems and smoking history.  Any recent colds or infections. RISKS AND COMPLICATIONS This procedure is generally safe. The risks and complications depend on what treatment site is used. General complications may include:  Pain or soreness at the injection site.  Infection at the injection site.  Damage to nerves or blood vessels. BEFORE THE PROCEDURE  Ask your caregiver about changing or stopping any medicines you are on before the procedure.  If you take blood thinners, ask if you should stop taking them before the procedure.  You may be asked to wash with an antibiotic soap  before the procedure.  Do not eat or drink for 8 hours before your procedure or as told by your caregiver.  Ask your caregiver what time you need to arrive for your procedure.  This is an outpatient procedure. This means you will be able to go home the same day. Make plans for someone to drive you home. PROCEDURE  You will be awake during the procedure. You need to be able to talk to the surgeon during the procedure. However, you might be given medicine to help you relax (sedative).  Medicine to numb the area (local anesthetic) will be injected.  With the help of a type of X-ray (fluoroscopy), a radio frequency needle will be inserted into the area to be treated. Then, a wire that carries the radio waves (electrode) will be put through the radio frequency needle. An electrical pulse will be sent through the electrode to verify the correct nerve.  You will feel a tingling sensation similar to hitting your "funny bone." You may also have muscle twitching. The tissue around the needle tip is then heated when electric current is passed using the radio frequency machine. This numbs the nerves.  A bandage (dressing) will be put on the area after the procedure is done. AFTER THE PROCEDURE  You will stay in a recovery area until you are awake enough to eat and drink.  Once everything is back to normal, you will be able to go home.  You will need to arrange for someone to drive you home if you received a sedative or pain relieving medicine during the procedure. Document Released: 01/22/2011 Document Revised: 08/18/2011 Document Reviewed: 01/22/2011 [facility] Patient Information 2015 [place], [place]. This information is not intended to replace advice given  to you by your health care provider. Make sure you discuss any questions you have with your health care provider. Pain Management Discharge Instructions  General Discharge Instructions :  If you need to reach your doctor call: Monday-Friday  8:00 am - 4:00 pm at [phone] or toll free [phone].  After clinic hours [phone] to have operator reach doctor.  Bring all of your medication bottles to all your appointments in the pain clinic.  To cancel or reschedule your appointment with Pain Management please remember to call 24 hours in advance to avoid a fee.  Refer to the educational materials which you have been given on: General Risks, I had my Procedure. Discharge Instructions, Post Sedation.  Post Procedure Instructions:  The drugs you were given will stay in your system until tomorrow, so for the next 24 hours you should not drive, make any legal decisions or drink any alcoholic beverages.  You may eat anything you prefer, but it is better to start with liquids then soups and crackers, and gradually work up to solid foods.  Please notify your doctor immediately if you have any unusual bleeding, trouble breathing or pain that is not related to your normal pain.  Depending on the type of procedure that was done, some parts of your body may feel week and/or numb.  This usually clears up by tonight or the next day.  Walk with the use of an assistive device or accompanied by an adult for the 24 hours.  You may use ice on the affected area for the first 24 hours.  Put ice in a Ziploc bag and cover with a towel and place against area 15 minutes on 15 minutes off.  You may switch to heat after 24 hours.

## 2015-02-28 NOTE — Progress Notes (Signed)
Safety precautions to be maintained throughout the outpatient stay will include: orient to surroundings, keep bed in low position, maintain call bell within reach at all times, provide assistance with transfer out of bed and ambulation.  

## 2015-03-01 ENCOUNTER — Telehealth: Payer: Self-pay

## 2015-03-01 NOTE — Telephone Encounter (Signed)
Left message to call with any problems postprocedure

## 2015-03-22 ENCOUNTER — Encounter: Payer: Self-pay | Admitting: Pain Medicine

## 2015-03-22 ENCOUNTER — Ambulatory Visit: Payer: Medicare Other | Attending: Pain Medicine | Admitting: Pain Medicine

## 2015-03-22 VITALS — BP 119/68 | HR 68 | Temp 98.0°F | Resp 14 | Ht 61.0 in | Wt 171.0 lb

## 2015-03-22 DIAGNOSIS — G2581 Restless legs syndrome: Secondary | ICD-10-CM

## 2015-03-22 DIAGNOSIS — M533 Sacrococcygeal disorders, not elsewhere classified: Secondary | ICD-10-CM | POA: Insufficient documentation

## 2015-03-22 DIAGNOSIS — G5602 Carpal tunnel syndrome, left upper limb: Secondary | ICD-10-CM

## 2015-03-22 DIAGNOSIS — M4726 Other spondylosis with radiculopathy, lumbar region: Secondary | ICD-10-CM

## 2015-03-22 DIAGNOSIS — M5481 Occipital neuralgia: Secondary | ICD-10-CM

## 2015-03-22 DIAGNOSIS — M79605 Pain in left leg: Secondary | ICD-10-CM | POA: Diagnosis present

## 2015-03-22 DIAGNOSIS — G5600 Carpal tunnel syndrome, unspecified upper limb: Secondary | ICD-10-CM | POA: Insufficient documentation

## 2015-03-22 DIAGNOSIS — M659 Synovitis and tenosynovitis, unspecified: Secondary | ICD-10-CM | POA: Diagnosis not present

## 2015-03-22 DIAGNOSIS — M545 Low back pain: Secondary | ICD-10-CM | POA: Diagnosis present

## 2015-03-22 DIAGNOSIS — M1610 Unilateral primary osteoarthritis, unspecified hip: Secondary | ICD-10-CM | POA: Diagnosis not present

## 2015-03-22 DIAGNOSIS — M47896 Other spondylosis, lumbar region: Secondary | ICD-10-CM | POA: Diagnosis not present

## 2015-03-22 DIAGNOSIS — M160 Bilateral primary osteoarthritis of hip: Secondary | ICD-10-CM

## 2015-03-22 DIAGNOSIS — G609 Hereditary and idiopathic neuropathy, unspecified: Secondary | ICD-10-CM

## 2015-03-22 DIAGNOSIS — M79604 Pain in right leg: Secondary | ICD-10-CM | POA: Diagnosis present

## 2015-03-22 DIAGNOSIS — M47816 Spondylosis without myelopathy or radiculopathy, lumbar region: Secondary | ICD-10-CM

## 2015-03-22 DIAGNOSIS — M5416 Radiculopathy, lumbar region: Secondary | ICD-10-CM

## 2015-03-22 DIAGNOSIS — M5136 Other intervertebral disc degeneration, lumbar region: Secondary | ICD-10-CM

## 2015-03-22 DIAGNOSIS — M4806 Spinal stenosis, lumbar region: Secondary | ICD-10-CM | POA: Diagnosis not present

## 2015-03-22 MED ORDER — OXYCODONE-ACETAMINOPHEN 5-325 MG PO TABS: ORAL_TABLET | ORAL | Status: DC

## 2015-03-22 MED ORDER — TIZANIDINE HCL 2 MG PO CAPS: ORAL_CAPSULE | ORAL | Status: DC

## 2015-03-22 NOTE — Progress Notes (Signed)
   Subjective:    Patient ID: Janet Murray, female    DOB: 11/09/1970, 44 y.o.   MRN: [id number]  HPI Patient is 44 year old female returns to pain management Center for further evaluation and treatment of pain involving the lower back lower extremity region with pain of the neck of lesser degree. Patient states she has had some improvement of pain following radiofrequency rhizolysis lumbar facet, medial branch nerve,. Patient appears to be experiencing postprocedure pain at this time and the lumbar region with significant inflammatory response. We discussed patient's condition and will proceed with interventional treatment consisting of lumbosacral selective nerve root block and myoneural block injections of the paraspinal musculature region lumbar region to decrease severity of patient's symptoms, minimize progression of symptoms, and avoid need for more involved treatment. The patient was understanding and agree suggested treatment plan. We will continue Zanaflex and oxycodone as prescribed.   Review of Systems     Objective:   Physical Exam   There was mild tenderness of the splenius capitis and occipitalis musculature region. There was mild tenderness of the cervical facet cervical paraspinal musculature region. Patient appeared to be with unremarkable Spurling's maneuver. Palpation over the region of the thoracic facet thoracic paraspinal muscles region was with mild tends to palpation as well. Palpation over the acromioclavicular and glenohumeral joint regions reproduced mild discomfort. Patient was with decreased grip strength on the left compared to the right. Palpation over the lumbar paraspinal muscles lumbar facet region associated with moderate discomfort. Lateral bending and rotation and extension palpation lumbar facets reproduce moderate discomfort. Straight leg raising tolerates approximately 20 without increased pain noted with dorsiflexion noted. No definite sensory deficit of  dermatomal distribution detected. There was negative clonus negative Homans. Greater trochanteric region iliotibial band region was without increased pain of significant degree. There appeared to be significant muscle spasms occurring in the lumbar paraspinal musculature region left as well as on the right as well as the gluteal and piriformis musculature region with limited straight leg raising. Homans negative Homans. Abdomen nontender with no costovertebral tenderness noted.        Assessment & Plan:    L4-L5 level degenerative changes noted on prior MRI  Lumbar stenosis with neurogenic claudication  Lumbar facet syndrome  Lumbar radiculopathy  Carpal tunnel syndrome  Tenosynovitis of the left upper extremity  Sacroiliac joint disease  Degenerative joint disease of hip     PLAN   Continue present medication Zanaflex and oxycodone  Lumbosacral selective nerve root block to be performed at time return appointment  F/U PCP  Dr. Brynda Greathouse for evaliation of  BP and general medical  condition  F/U surgical evaluation as discussed  F/U neurological evaluation. May consider [facility] EMG studies and other studies pending follow-up evaluation response to present treatment  May consider additional radiofrequency rhizolysis lumbar facet, medial branch nerves pending response to treatment and follow-up evaluation  Patient to call Pain Management Center should patient have concerns prior to scheduled return appointment.

## 2015-03-22 NOTE — Patient Instructions (Addendum)
PLAN   Continue present medication Zanaflex and oxycodone  Lumbosacral selective nerve root block to be performed at time return appointment  F/U PCP Dr. Brynda Greathouse for evaliation of  BP and general medical  condition  F/U surgical evaluation. May consider pending follow-up evaluations  F/U neurological evaluation. May consider pending follow-up evaluations  May consider radiofrequency rhizolysis or intraspinal procedures pending response to present treatment and F/U evaluation   Patient to call Pain Management Center should patient have concerns prior to scheduled return appointment. GENERAL RISKS AND COMPLICATIONS  What are the risk, side effects and possible complications? Generally speaking, most procedures are safe.  However, with any procedure there are risks, side effects, and the possibility of complications.  The risks and complications are dependent upon the sites that are lesioned, or the type of nerve block to be performed.  The closer the procedure is to the spine, the more serious the risks are.  Great care is taken when placing the radio frequency needles, block needles or lesioning probes, but sometimes complications can occur. 1. Infection: Any time there is an injection through the skin, there is a risk of infection.  This is why sterile conditions are used for these blocks.  There are four possible types of infection. 1. Localized skin infection. 2. Central Nervous System Infection-This can be in the form of Meningitis, which can be deadly. 3. Epidural Infections-This can be in the form of an epidural abscess, which can cause pressure inside of the spine, causing compression of the spinal cord with subsequent paralysis. This would require an emergency surgery to decompress, and there are no guarantees that the patient would recover from the paralysis. 4. Discitis-This is an infection of the intervertebral discs.  It occurs in about 1% of discography procedures.  It is difficult  to treat and it may lead to surgery.        2. Pain: the needles have to go through skin and soft tissues, will cause soreness.       3. Damage to internal structures:  The nerves to be lesioned may be near blood vessels or    other nerves which can be potentially damaged.       4. Bleeding: Bleeding is more common if the patient is taking blood thinners such as  aspirin, Coumadin, Ticiid, Plavix, etc., or if he/she have some genetic predisposition  such as hemophilia. Bleeding into the spinal canal can cause compression of the spinal  cord with subsequent paralysis.  This would require an emergency surgery to  decompress and there are no guarantees that the patient would recover from the  paralysis.       5. Pneumothorax:  Puncturing of a lung is a possibility, every time a needle is introduced in  the area of the chest or upper back.  Pneumothorax refers to free air around the  collapsed lung(s), inside of the thoracic cavity (chest cavity).  Another two possible  complications related to a similar event would include: Hemothorax and Chylothorax.   These are variations of the Pneumothorax, where instead of air around the collapsed  lung(s), you may have blood or chyle, respectively.       6. Spinal headaches: They may occur with any procedures in the area of the spine.       7. Persistent CSF (Cerebro-Spinal Fluid) leakage: This is a rare problem, but may occur  with prolonged intrathecal or epidural catheters either due to the formation of a fistulous  track or  a dural tear.       8. Nerve damage: By working so close to the spinal cord, there is always a possibility of  nerve damage, which could be as serious as a permanent spinal cord injury with  paralysis.       9. Death:  Although rare, severe deadly allergic reactions known as "Anaphylactic  reaction" can occur to any of the medications used.      10. Worsening of the symptoms:  We can always make thing worse.  What are the chances of something  like this happening? Chances of any of this occuring are extremely low.  By statistics, you have more of a chance of getting killed in a motor vehicle accident: while driving to the hospital than any of the above occurring .  Nevertheless, you should be aware that they are possibilities.  In general, it is similar to taking a shower.  Everybody knows that you can slip, hit your head and get killed.  Does that mean that you should not shower again?  Nevertheless always keep in mind that statistics do not mean anything if you happen to be on the wrong side of them.  Even if a procedure has a 1 (one) in a 1,000,000 (million) chance of going wrong, it you happen to be that one..Also, keep in mind that by statistics, you have more of a chance of having something go wrong when taking medications.  Who should not have this procedure? If you are on a blood thinning medication (e.g. Coumadin, Plavix, see list of "Blood Thinners"), or if you have an active infection going on, you should not have the procedure.  If you are taking any blood thinners, please inform your physician.  How should I prepare for this procedure?  Do not eat or drink anything at least six hours prior to the procedure.  Bring a driver with you .  It cannot be a taxi.  Come accompanied by an adult that can drive you back, and that is strong enough to help you if your legs get weak or numb from the local anesthetic.  Take all of your medicines the morning of the procedure with just enough water to swallow them.  If you have diabetes, make sure that you are scheduled to have your procedure done first thing in the morning, whenever possible.  If you have diabetes, take only half of your insulin dose and notify our nurse that you have done so as soon as you arrive at the clinic.  If you are diabetic, but only take blood sugar pills (oral hypoglycemic), then do not take them on the morning of your procedure.  You may take them after you  have had the procedure.  Do not take aspirin or any aspirin-containing medications, at least eleven (11) days prior to the procedure.  They may prolong bleeding.  Wear loose fitting clothing that may be easy to take off and that you would not mind if it got stained with Betadine or blood.  Do not wear any jewelry or perfume  Remove any nail coloring.  It will interfere with some of our monitoring equipment.  NOTE: Remember that this is not meant to be interpreted as a complete list of all possible complications.  Unforeseen problems may occur.  BLOOD THINNERS The following drugs contain aspirin or other products, which can cause increased bleeding during surgery and should not be taken for 2 weeks prior to and 1 week after surgery.  If  you should need take something for relief of minor pain, you may take acetaminophen which is found in Tylenol,m Datril, Anacin-3 and Panadol. It is not blood thinner. The products listed below are.  Do not take any of the products listed below in addition to any listed on your instruction sheet.  A.P.C or A.P.C with Codeine Codeine Phosphate Capsules #3 Ibuprofen Ridaura  ABC compound Congesprin Imuran rimadil  Advil Cope Indocin Robaxisal  Alka-Seltzer Effervescent Pain Reliever and Antacid Coricidin or Coricidin-D  Indomethacin Rufen  Alka-Seltzer plus Cold Medicine Cosprin Ketoprofen S-A-C Tablets  Anacin Analgesic Tablets or Capsules Coumadin Korlgesic Salflex  Anacin Extra Strength Analgesic tablets or capsules CP-2 Tablets Lanoril Salicylate  Anaprox Cuprimine Capsules Levenox Salocol  Anexsia-D Dalteparin Magan Salsalate  Anodynos Darvon compound Magnesium Salicylate Sine-off  Ansaid Dasin Capsules Magsal Sodium Salicylate  Anturane Depen Capsules Marnal Soma  APF Arthritis pain formula Dewitt's Pills Measurin Stanback  Argesic Dia-Gesic Meclofenamic Sulfinpyrazone  Arthritis Bayer Timed Release Aspirin Diclofenac Meclomen Sulindac  Arthritis pain  formula Anacin Dicumarol Medipren Supac  Analgesic (Safety coated) Arthralgen Diffunasal Mefanamic Suprofen  Arthritis Strength Bufferin Dihydrocodeine Mepro Compound Suprol  Arthropan liquid Dopirydamole Methcarbomol with Aspirin Synalgos  ASA tablets/Enseals Disalcid Micrainin Tagament  Ascriptin Doan's Midol Talwin  Ascriptin A/D Dolene Mobidin Tanderil  Ascriptin Extra Strength Dolobid Moblgesic Ticlid  Ascriptin with Codeine Doloprin or Doloprin with Codeine Momentum Tolectin  Asperbuf Duoprin Mono-gesic Trendar  Aspergum Duradyne Motrin or Motrin IB Triminicin  Aspirin plain, buffered or enteric coated Durasal Myochrisine Trigesic  Aspirin Suppositories Easprin Nalfon Trillsate  Aspirin with Codeine Ecotrin Regular or Extra Strength Naprosyn Uracel  Atromid-S Efficin Naproxen Ursinus  Auranofin Capsules Elmiron Neocylate Vanquish  Axotal Emagrin Norgesic Verin  Azathioprine Empirin or Empirin with Codeine Normiflo Vitamin E  Azolid Emprazil Nuprin Voltaren  Bayer Aspirin plain, buffered or children's or timed BC Tablets or powders Encaprin Orgaran Warfarin Sodium  Buff-a-Comp Enoxaparin Orudis Zorpin  Buff-a-Comp with Codeine Equegesic Os-Cal-Gesic   Buffaprin Excedrin plain, buffered or Extra Strength Oxalid   Bufferin Arthritis Strength Feldene Oxphenbutazone   Bufferin plain or Extra Strength Feldene Capsules Oxycodone with Aspirin   Bufferin with Codeine Fenoprofen Fenoprofen Pabalate or Pabalate-SF   Buffets II Flogesic Panagesic   Buffinol plain or Extra Strength Florinal or Florinal with Codeine Panwarfarin   Buf-Tabs Flurbiprofen Penicillamine   Butalbital Compound Four-way cold tablets Penicillin   Butazolidin Fragmin Pepto-Bismol   Carbenicillin Geminisyn Percodan   Carna Arthritis Reliever Geopen Persantine   Carprofen Gold's salt Persistin   Chloramphenicol Goody's Phenylbutazone   Chloromycetin Haltrain Piroxlcam   Clmetidine heparin Plaquenil   Cllnoril  Hyco-pap Ponstel   Clofibrate Hydroxy chloroquine Propoxyphen         Before stopping any of these medications, be sure to consult the physician who ordered them.  Some, such as Coumadin (Warfarin) are ordered to prevent or treat serious conditions such as "deep thrombosis", "pumonary embolisms", and other heart problems.  The amount of time that you may need off of the medication may also vary with the medication and the reason for which you were taking it.  If you are taking any of these medications, please make sure you notify your pain physician before you undergo any procedures.         Selective Nerve Root Block Patient Information  Description: Specific nerve roots exit the spinal canal and these nerves can be compressed and inflamed by a bulging disc and bone spurs.  By injecting steroids on  the nerve root, we can potentially decrease the inflammation surrounding these nerves, which often leads to decreased pain.  Also, by injecting local anesthesia on the nerve root, this can provide [place] helpful information to give to your referring doctor if it decreases your pain.  Selective nerve root blocks can be done along the spine from the neck to the low back depending on the location of your pain.   After numbing the skin with local anesthesia, a small needle is passed to the nerve root and the position of the needle is verified using x-ray pictures.  After the needle is in correct position, we then deposit the medication.  You may experience a pressure sensation while this is being done.  The entire block usually lasts less than 15 minutes.  Conditions that may be treated with selective nerve root blocks:  Low back and leg pain  Spinal stenosis  Diagnostic block prior to potential surgery  Neck and arm pain  Post laminectomy syndrome  Preparation for the injection:  1. Do not eat any solid food or dairy products within 6 hours of your appointment. 2. You may drink clear liquids up  to 2 hours before an appointment.  Clear liquids include water, black coffee, juice or soda.  No milk or cream please. 3. You may take your regular medications, including pain medications, with a sip of water before your appointment.  Diabetics should hold regular insulin (if taken separately) and take 1/2 normal NPH dose the morning of the procedure.  Carry some sugar containing items with you to your appointment. 4. A driver must accompany you and be prepared to drive you home after your procedure. 5. Bring all your current medications with you. 6. An IV may be inserted and sedation may be given at the discretion of the physician. 7. A blood pressure cuff, EKG, and other monitors will often be applied during the procedure.  Some patients may need to have extra oxygen administered for a short period. 8. You will be asked to provide medical information, including allergies, prior to the procedure.  We must know immediately if you are taking blood  Thinners (like Coumadin) or if you are allergic to IV iodine contrast (dye).  Possible side-effects: All are usually temporary  Bleeding from needle site  Light headedness  Numbness and tingling  Decreased blood pressure  Weakness in arms/legs  Pressure sensation in back/neck  Pain at injection site (several days)  Possible complications: All are extremely rare  Infection  Nerve injury  Spinal headache (a headache wore with upright position)  Call if you experience:  Fever/chills associated with headache or increased back/neck pain  Headache worsened by an upright position  New onset weakness or numbness of an extremity below the injection site  Hives or difficulty breathing (go to the emergency room)  Inflammation or drainage at the injection site(s)  Severe back/neck pain greater than usual  New symptoms which are concerning to you  Please note:  Although the local anesthetic injected can often make your back or neck  feel good for several hours after the injection the pain will likely return.  It takes 3-5 days for steroids to work on the nerve root. You may not notice any pain relief for at least one week.  If effective, we will often do a series of 3 injections spaced 3-6 weeks apart to maximally decrease your pain.    If you have any questions, please call [phone] [facility]

## 2015-03-26 ENCOUNTER — Encounter: Payer: Self-pay | Admitting: Pain Medicine

## 2015-03-26 ENCOUNTER — Ambulatory Visit: Payer: Medicare Other | Attending: Pain Medicine | Admitting: Pain Medicine

## 2015-03-26 VITALS — BP 100/55 | HR 81 | Temp 98.5°F | Resp 14 | Ht 61.0 in | Wt 172.0 lb

## 2015-03-26 DIAGNOSIS — M533 Sacrococcygeal disorders, not elsewhere classified: Secondary | ICD-10-CM

## 2015-03-26 DIAGNOSIS — M79604 Pain in right leg: Secondary | ICD-10-CM | POA: Diagnosis present

## 2015-03-26 DIAGNOSIS — M4726 Other spondylosis with radiculopathy, lumbar region: Secondary | ICD-10-CM

## 2015-03-26 DIAGNOSIS — M5136 Other intervertebral disc degeneration, lumbar region: Secondary | ICD-10-CM

## 2015-03-26 DIAGNOSIS — M79605 Pain in left leg: Secondary | ICD-10-CM | POA: Diagnosis present

## 2015-03-26 DIAGNOSIS — M160 Bilateral primary osteoarthritis of hip: Secondary | ICD-10-CM

## 2015-03-26 DIAGNOSIS — M545 Low back pain: Secondary | ICD-10-CM | POA: Diagnosis present

## 2015-03-26 DIAGNOSIS — G609 Hereditary and idiopathic neuropathy, unspecified: Secondary | ICD-10-CM

## 2015-03-26 DIAGNOSIS — M5481 Occipital neuralgia: Secondary | ICD-10-CM

## 2015-03-26 DIAGNOSIS — M5416 Radiculopathy, lumbar region: Secondary | ICD-10-CM

## 2015-03-26 DIAGNOSIS — M47816 Spondylosis without myelopathy or radiculopathy, lumbar region: Secondary | ICD-10-CM

## 2015-03-26 DIAGNOSIS — G5602 Carpal tunnel syndrome, left upper limb: Secondary | ICD-10-CM

## 2015-03-26 DIAGNOSIS — G2581 Restless legs syndrome: Secondary | ICD-10-CM

## 2015-03-26 MED ORDER — FENTANYL CITRATE (PF) 100 MCG/2ML IJ SOLN
100.0000 ug | Freq: Once | INTRAMUSCULAR | Status: AC
  Administered 2015-03-26: 100 ug via INTRAVENOUS

## 2015-03-26 MED ORDER — LIDOCAINE HCL (PF) 1 % IJ SOLN: 10.0000 mL | Freq: Once | INTRAMUSCULAR | Status: DC

## 2015-03-26 MED ORDER — MIDAZOLAM HCL 5 MG/5ML IJ SOLN
5.0000 mg | Freq: Once | INTRAMUSCULAR | Status: AC
  Administered 2015-03-26: 5 mg via INTRAVENOUS

## 2015-03-26 MED ORDER — SODIUM CHLORIDE 0.9 % IJ SOLN: 20.0000 mL | Freq: Once | INTRAMUSCULAR | Status: DC

## 2015-03-26 MED ORDER — SODIUM CHLORIDE 0.9 % IJ SOLN
INTRAMUSCULAR | Status: AC
  Filled 2015-03-26: qty 10

## 2015-03-26 MED ORDER — LACTATED RINGERS IV SOLN: 1000.0000 mL | INTRAVENOUS | Status: DC

## 2015-03-26 MED ORDER — TRIAMCINOLONE ACETONIDE 40 MG/ML IJ SUSP
INTRAMUSCULAR | Status: AC
  Filled 2015-03-26: qty 1

## 2015-03-26 MED ORDER — FENTANYL CITRATE (PF) 100 MCG/2ML IJ SOLN
INTRAMUSCULAR | Status: AC
  Administered 2015-03-26: 100 ug via INTRAVENOUS
  Filled 2015-03-26: qty 2

## 2015-03-26 MED ORDER — ORPHENADRINE CITRATE 30 MG/ML IJ SOLN
INTRAMUSCULAR | Status: AC
  Filled 2015-03-26: qty 2

## 2015-03-26 MED ORDER — BUPIVACAINE HCL (PF) 0.25 % IJ SOLN
INTRAMUSCULAR | Status: AC
  Filled 2015-03-26: qty 30

## 2015-03-26 MED ORDER — TRIAMCINOLONE ACETONIDE 40 MG/ML IJ SUSP
40.0000 mg | Freq: Once | INTRAMUSCULAR | Status: AC
  Administered 2015-03-26: 14:00:00

## 2015-03-26 MED ORDER — MIDAZOLAM HCL 5 MG/5ML IJ SOLN
INTRAMUSCULAR | Status: AC
  Administered 2015-03-26: 5 mg via INTRAVENOUS
  Filled 2015-03-26: qty 5

## 2015-03-26 MED ORDER — ORPHENADRINE CITRATE 30 MG/ML IJ SOLN
60.0000 mg | Freq: Once | INTRAMUSCULAR | Status: AC
  Administered 2015-03-26: 14:00:00 via INTRAMUSCULAR

## 2015-03-26 MED ORDER — BUPIVACAINE HCL (PF) 0.25 % IJ SOLN
30.0000 mL | Freq: Once | INTRAMUSCULAR | Status: AC
  Administered 2015-03-26: 14:00:00

## 2015-03-26 NOTE — Progress Notes (Signed)
Safety precautions to be maintained throughout the outpatient stay will include: orient to surroundings, keep bed in low position, maintain call bell within reach at all times, provide assistance with transfer out of bed and ambulation.  

## 2015-03-26 NOTE — Progress Notes (Signed)
Subjective:    Patient ID: Janet Murray, female    DOB: 30-Apr-1971, 44 y.o.   MRN: [id number]  HPI PROCEDURE PERFORMED: Lumbosacral selective nerve root block   NOTE: The patient is a 44 y.o. female who returns to [place] for further evaluation and treatment of pain involving the lumbar and lower extremity region. Studies consisting of MRI has revealed the patient to be with evidence of L4-5 degenerative changes. There is concern regarding radicular component of pain contributing to patient's symptoms. There is concern regarding intraspinal abnormalities contributing to the patient's symptomatology. The risks, benefits, and expectations of the procedure have been explained to the patient who was understanding and in agreement with suggested treatment plan. We will proceed with interventional treatment as discussed and as explained to the patient. The patient is understanding and in agreement with suggested treatment plan.   DESCRIPTION OF PROCEDURE: Lumbosacral selective nerve root block with IV Versed, IV fentanyl conscious sedation, EKG, blood pressure, pulse, and pulse oximetry monitoring. The procedure was performed with the patient in the prone position under fluoroscopic guidance. With the patient in the prone position, Betadine prep of proposed entry site was performed. Local anesthetic skin wheal of proposed needle entry site was prepared with 1.5% plain lidocaine with AP view of the lumbosacral spine.   PROCEDURE #1: Needle placement at the left L 2 vertebral body: A 22 -gauge needle was inserted at the inferior border of the transverse process of the vertebral body with needle placed medial to the midline of the transverse process on AP view of the lumbosacral spine.   NEEDLE PLACEMENT AT  L3, L4, and L5  VERTEBRAL BODY LEVELS  Needle  placement was accomplished at L3, L4, and L5  vertebral body levels on the left side exactly as was accomplished at the L2  vertebral body  level  and utilizing the same technique and under fluoroscopic guidance.   Needle placement was then verified on lateral view at all levels with needle tip documented to be in the posterior superior quadrant of the intervertebral foramen of  L 2, L3, L4, and L5.  Following negative aspiration for heme and CSF at each level, each level was injected with 3 mL of 0.25% bupivacaine with Kenalog.   Myoneural block injections of the lumbar paraspinal musculature region Following Betadine prep proposed entry site a 22-gauge needle was inserted in the lumbar paraspinal musculature region and following negative aspiration 2 cc of 0.25% bupivacaine with Norflex was injected for myoneural block injection times   .The patient tolerated the procedure well. A total of 10 mg of Kenalog was utilized for the procedure.   PLAN:  1. Medications: Will continue presently prescribed medications. Zanaflex and oxycodone 2. The patient is to undergo follow-up evaluation with PCP Dr. Brynda Greathouse for evaluation of blood pressure and general medical condition status post procedure performed on today's visit. 3. Surgical follow-up evaluation as discussed 4. Neurological evaluation as discussed 5. May consider additional procedures including additional radiofrequency procedures pending response to treatment and follow-up evaluation 6. The patient has been advise do adhere to proper body mechanics and avoid activities which may aggravate condition. 7. The patient has been advised to call the Pain Management Center prior to scheduled return appointment should there be significant change in the patient's condition or should the patient have other concerns regarding condition prior to scheduled return appointment.    Review of Systems     Objective:   Physical Exam  Assessment & Plan:

## 2015-03-26 NOTE — Patient Instructions (Addendum)
PLAN   Continue present medication Zanaflex and oxycodone  F/U PCP Dr. Brynda Greathouse for evaliation of  BP and general medical  condition  F/U surgical evaluation. May consider pending follow-up evaluations  F/U neurological evaluation. May consider pending follow-up evaluations  May consider radiofrequency rhizolysis or intraspinal procedures pending response to present treatment and F/U evaluation   Patient to call Pain Management Center should patient have concerns prior to scheduled return appointment. Pain Management Discharge Instructions  General Discharge Instructions :  If you need to reach your doctor call: Monday-Friday 8:00 am - 4:00 pm at [phone] or toll free [phone].  After clinic hours [phone] to have operator reach doctor.  Bring all of your medication bottles to all your appointments in the pain clinic.  To cancel or reschedule your appointment with Pain Management please remember to call 24 hours in advance to avoid a fee.  Refer to the educational materials which you have been given on: General Risks, I had my Procedure. Discharge Instructions, Post Sedation.  Post Procedure Instructions:  The drugs you were given will stay in your system until tomorrow, so for the next 24 hours you should not drive, make any legal decisions or drink any alcoholic beverages.  You may eat anything you prefer, but it is better to start with liquids then soups and crackers, and gradually work up to solid foods.  Please notify your doctor immediately if you have any unusual bleeding, trouble breathing or pain that is not related to your normal pain.  Depending on the type of procedure that was done, some parts of your body may feel week and/or numb.  This usually clears up by tonight or the next day.  Walk with the use of an assistive device or accompanied by an adult for the 24 hours.  You may use ice on the affected area for the first 24 hours.  Put ice in a Ziploc bag  and cover with a towel and place against area 15 minutes on 15 minutes off.  You may switch to heat after 24 hours.GENERAL RISKS AND COMPLICATIONS  What are the risk, side effects and possible complications? Generally speaking, most procedures are safe.  However, with any procedure there are risks, side effects, and the possibility of complications.  The risks and complications are dependent upon the sites that are lesioned, or the type of nerve block to be performed.  The closer the procedure is to the spine, the more serious the risks are.  Great care is taken when placing the radio frequency needles, block needles or lesioning probes, but sometimes complications can occur. 1. Infection: Any time there is an injection through the skin, there is a risk of infection.  This is why sterile conditions are used for these blocks.  There are four possible types of infection. 1. Localized skin infection. 2. Central Nervous System Infection-This can be in the form of Meningitis, which can be deadly. 3. Epidural Infections-This can be in the form of an epidural abscess, which can cause pressure inside of the spine, causing compression of the spinal cord with subsequent paralysis. This would require an emergency surgery to decompress, and there are no guarantees that the patient would recover from the paralysis. 4. Discitis-This is an infection of the intervertebral discs.  It occurs in about 1% of discography procedures.  It is difficult to treat and it may lead to surgery.        2. Pain: the needles have to go through skin and  soft tissues, will cause soreness.       3. Damage to internal structures:  The nerves to be lesioned may be near blood vessels or    other nerves which can be potentially damaged.       4. Bleeding: Bleeding is more common if the patient is taking blood thinners such as  aspirin, Coumadin, Ticiid, Plavix, etc., or if he/she have some genetic predisposition  such as hemophilia. Bleeding  into the spinal canal can cause compression of the spinal  cord with subsequent paralysis.  This would require an emergency surgery to  decompress and there are no guarantees that the patient would recover from the  paralysis.       5. Pneumothorax:  Puncturing of a lung is a possibility, every time a needle is introduced in  the area of the chest or upper back.  Pneumothorax refers to free air around the  collapsed lung(s), inside of the thoracic cavity (chest cavity).  Another two possible  complications related to a similar event would include: Hemothorax and Chylothorax.   These are variations of the Pneumothorax, where instead of air around the collapsed  lung(s), you may have blood or chyle, respectively.       6. Spinal headaches: They may occur with any procedures in the area of the spine.       7. Persistent CSF (Cerebro-Spinal Fluid) leakage: This is a rare problem, but may occur  with prolonged intrathecal or epidural catheters either due to the formation of a fistulous  track or a dural tear.       8. Nerve damage: By working so close to the spinal cord, there is always a possibility of  nerve damage, which could be as serious as a permanent spinal cord injury with  paralysis.       9. Death:  Although rare, severe deadly allergic reactions known as "Anaphylactic  reaction" can occur to any of the medications used.      10. Worsening of the symptoms:  We can always make thing worse.  What are the chances of something like this happening? Chances of any of this occuring are extremely low.  By statistics, you have more of a chance of getting killed in a motor vehicle accident: while driving to the hospital than any of the above occurring .  Nevertheless, you should be aware that they are possibilities.  In general, it is similar to taking a shower.  Everybody knows that you can slip, hit your head and get killed.  Does that mean that you should not shower again?  Nevertheless always keep in mind  that statistics do not mean anything if you happen to be on the wrong side of them.  Even if a procedure has a 1 (one) in a 1,000,000 (million) chance of going wrong, it you happen to be that one..Also, keep in mind that by statistics, you have more of a chance of having something go wrong when taking medications.  Who should not have this procedure? If you are on a blood thinning medication (e.g. Coumadin, Plavix, see list of "Blood Thinners"), or if you have an active infection going on, you should not have the procedure.  If you are taking any blood thinners, please inform your physician.  How should I prepare for this procedure?  Do not eat or drink anything at least six hours prior to the procedure.  Bring a driver with you .  It cannot be a taxi.  Come accompanied by an adult that can drive you back, and that is strong enough to help you if your legs get weak or numb from the local anesthetic.  Take all of your medicines the morning of the procedure with just enough water to swallow them.  If you have diabetes, make sure that you are scheduled to have your procedure done first thing in the morning, whenever possible.  If you have diabetes, take only half of your insulin dose and notify our nurse that you have done so as soon as you arrive at the clinic.  If you are diabetic, but only take blood sugar pills (oral hypoglycemic), then do not take them on the morning of your procedure.  You may take them after you have had the procedure.  Do not take aspirin or any aspirin-containing medications, at least eleven (11) days prior to the procedure.  They may prolong bleeding.  Wear loose fitting clothing that may be easy to take off and that you would not mind if it got stained with Betadine or blood.  Do not wear any jewelry or perfume  Remove any nail coloring.  It will interfere with some of our monitoring equipment.  NOTE: Remember that this is not meant to be interpreted as a  complete list of all possible complications.  Unforeseen problems may occur.  BLOOD THINNERS The following drugs contain aspirin or other products, which can cause increased bleeding during surgery and should not be taken for 2 weeks prior to and 1 week after surgery.  If you should need take something for relief of minor pain, you may take acetaminophen which is found in Tylenol,m Datril, Anacin-3 and Panadol. It is not blood thinner. The products listed below are.  Do not take any of the products listed below in addition to any listed on your instruction sheet.  A.P.C or A.P.C with Codeine Codeine Phosphate Capsules #3 Ibuprofen Ridaura  ABC compound Congesprin Imuran rimadil  Advil Cope Indocin Robaxisal  Alka-Seltzer Effervescent Pain Reliever and Antacid Coricidin or Coricidin-D  Indomethacin Rufen  Alka-Seltzer plus Cold Medicine Cosprin Ketoprofen S-A-C Tablets  Anacin Analgesic Tablets or Capsules Coumadin Korlgesic Salflex  Anacin Extra Strength Analgesic tablets or capsules CP-2 Tablets Lanoril Salicylate  Anaprox Cuprimine Capsules Levenox Salocol  Anexsia-D Dalteparin Magan Salsalate  Anodynos Darvon compound Magnesium Salicylate Sine-off  Ansaid Dasin Capsules Magsal Sodium Salicylate  Anturane Depen Capsules Marnal Soma  APF Arthritis pain formula Dewitt's Pills Measurin Stanback  Argesic Dia-Gesic Meclofenamic Sulfinpyrazone  Arthritis Bayer Timed Release Aspirin Diclofenac Meclomen Sulindac  Arthritis pain formula Anacin Dicumarol Medipren Supac  Analgesic (Safety coated) Arthralgen Diffunasal Mefanamic Suprofen  Arthritis Strength Bufferin Dihydrocodeine Mepro Compound Suprol  Arthropan liquid Dopirydamole Methcarbomol with Aspirin Synalgos  ASA tablets/Enseals Disalcid Micrainin Tagament  Ascriptin Doan's Midol Talwin  Ascriptin A/D Dolene Mobidin Tanderil  Ascriptin Extra Strength Dolobid Moblgesic Ticlid  Ascriptin with Codeine Doloprin or Doloprin with Codeine  Momentum Tolectin  Asperbuf Duoprin Mono-gesic Trendar  Aspergum Duradyne Motrin or Motrin IB Triminicin  Aspirin plain, buffered or enteric coated Durasal Myochrisine Trigesic  Aspirin Suppositories Easprin Nalfon Trillsate  Aspirin with Codeine Ecotrin Regular or Extra Strength Naprosyn Uracel  Atromid-S Efficin Naproxen Ursinus  Auranofin Capsules Elmiron Neocylate Vanquish  Axotal Emagrin Norgesic Verin  Azathioprine Empirin or Empirin with Codeine Normiflo Vitamin E  Azolid Emprazil Nuprin Voltaren  Bayer Aspirin plain, buffered or children's or timed BC Tablets or powders Encaprin Orgaran Warfarin Sodium  Buff-a-Comp Enoxaparin Orudis Zorpin  Buff-a-Comp with Codeine Equegesic Os-Cal-Gesic   Buffaprin Excedrin plain, buffered or Extra Strength Oxalid   Bufferin Arthritis Strength Feldene Oxphenbutazone   Bufferin plain or Extra Strength Feldene Capsules Oxycodone with Aspirin   Bufferin with Codeine Fenoprofen Fenoprofen Pabalate or Pabalate-SF   Buffets II Flogesic Panagesic   Buffinol plain or Extra Strength Florinal or Florinal with Codeine Panwarfarin   Buf-Tabs Flurbiprofen Penicillamine   Butalbital Compound Four-way cold tablets Penicillin   Butazolidin Fragmin Pepto-Bismol   Carbenicillin Geminisyn Percodan   Carna Arthritis Reliever Geopen Persantine   Carprofen Gold's salt Persistin   Chloramphenicol Goody's Phenylbutazone   Chloromycetin Haltrain Piroxlcam   Clmetidine heparin Plaquenil   Cllnoril Hyco-pap Ponstel   Clofibrate Hydroxy chloroquine Propoxyphen         Before stopping any of these medications, be sure to consult the physician who ordered them.  Some, such as Coumadin (Warfarin) are ordered to prevent or treat serious conditions such as "deep thrombosis", "pumonary embolisms", and other heart problems.  The amount of time that you may need off of the medication may also vary with the medication and the reason for which you were taking it.  If you are  taking any of these medications, please make sure you notify your pain physician before you undergo any procedures.

## 2015-03-27 NOTE — Telephone Encounter (Signed)
No problems post procedure. 

## 2015-04-18 ENCOUNTER — Encounter: Payer: Self-pay | Admitting: Pain Medicine

## 2015-04-18 ENCOUNTER — Ambulatory Visit: Payer: Medicare Other | Attending: Pain Medicine | Admitting: Pain Medicine

## 2015-04-18 VITALS — BP 104/62 | HR 90 | Temp 98.2°F | Resp 16 | Ht 61.0 in | Wt 172.0 lb

## 2015-04-18 DIAGNOSIS — G2581 Restless legs syndrome: Secondary | ICD-10-CM

## 2015-04-18 DIAGNOSIS — M160 Bilateral primary osteoarthritis of hip: Secondary | ICD-10-CM

## 2015-04-18 DIAGNOSIS — G609 Hereditary and idiopathic neuropathy, unspecified: Secondary | ICD-10-CM

## 2015-04-18 DIAGNOSIS — M533 Sacrococcygeal disorders, not elsewhere classified: Secondary | ICD-10-CM

## 2015-04-18 DIAGNOSIS — M79604 Pain in right leg: Secondary | ICD-10-CM | POA: Diagnosis present

## 2015-04-18 DIAGNOSIS — M4806 Spinal stenosis, lumbar region: Secondary | ICD-10-CM | POA: Insufficient documentation

## 2015-04-18 DIAGNOSIS — M659 Synovitis and tenosynovitis, unspecified: Secondary | ICD-10-CM | POA: Insufficient documentation

## 2015-04-18 DIAGNOSIS — M5481 Occipital neuralgia: Secondary | ICD-10-CM

## 2015-04-18 DIAGNOSIS — M47816 Spondylosis without myelopathy or radiculopathy, lumbar region: Secondary | ICD-10-CM

## 2015-04-18 DIAGNOSIS — M47896 Other spondylosis, lumbar region: Secondary | ICD-10-CM | POA: Diagnosis not present

## 2015-04-18 DIAGNOSIS — M79605 Pain in left leg: Secondary | ICD-10-CM | POA: Diagnosis present

## 2015-04-18 DIAGNOSIS — M4726 Other spondylosis with radiculopathy, lumbar region: Secondary | ICD-10-CM

## 2015-04-18 DIAGNOSIS — M545 Low back pain: Secondary | ICD-10-CM | POA: Diagnosis present

## 2015-04-18 DIAGNOSIS — G5600 Carpal tunnel syndrome, unspecified upper limb: Secondary | ICD-10-CM | POA: Diagnosis not present

## 2015-04-18 DIAGNOSIS — G5602 Carpal tunnel syndrome, left upper limb: Secondary | ICD-10-CM

## 2015-04-18 DIAGNOSIS — M5416 Radiculopathy, lumbar region: Secondary | ICD-10-CM

## 2015-04-18 DIAGNOSIS — M5136 Other intervertebral disc degeneration, lumbar region: Secondary | ICD-10-CM

## 2015-04-18 MED ORDER — TIZANIDINE HCL 2 MG PO CAPS: ORAL_CAPSULE | ORAL | Status: DC

## 2015-04-18 MED ORDER — OXYCODONE-ACETAMINOPHEN 5-325 MG PO TABS: ORAL_TABLET | ORAL | Status: DC

## 2015-04-18 NOTE — Progress Notes (Signed)
   Subjective:    Patient ID: Janet Murray, female    DOB: 16-Jul-1970, 44 y.o.   MRN: [id number]  HPI The patient is a 44 year old female who returns to pain management for further evaluation and treatment of lower back and lower extremity pain. The patient states that the pain is aggravated by standing and walking and becomes more intense than in the day. The patient denies any trauma or change in extensibility and to cause change in symptoms. The patient has undergone surgical evaluation and has been recommended to return to pain management Center for further treatment of her condition. We have discussed patient's condition and will consider patient for lumbosacral selective nerve root blocks to be performed at time of return appointment in attempt to decrease severity of symptoms, minimize progression of symptoms, and avoid any more en bloc treatment. The patient will continue oxycodone and Zanaflex and was in agreement with the suggested treatment plan. The patient is pain involving the lower back and lower extremity region       Review of Systems     Objective:   Physical Exam  There was tenderness to palpation of the splenius capitis and occipitalis musculature regions of minimal degree. There was unremarkable Spurling's maneuver and patient was decreased grip strength on the left compared to the right. Palpation over the thoracic facets reproduce mild discomfort with no crepitus of the thoracic region noted. Palpation over the lumbar paraspinal muscular region above said region was with moderate tightness to palpation with straight leg raising decreased on the left compared to the right with questionably decreased EHL strength. DTRs were difficult to elicit patient had difficulty relaxing. . There was negative clonus negative Homans. No definite sensory deficit of dermatomal distribution was detected. Abdomen was soft and nontender and no costovertebral tenderness was noted.    Assessment &  Plan:    L4-L5 level degenerative changes noted on prior MRI  Lumbar stenosis with neurogenic claudication  Lumbar facet syndrome  Lumbar radiculopathy  Carpal tunnel syndrome  Tenosynovitis of the left upper extremity    PLAN   Continue present medication Zanaflex and oxycodone  Lumbosacral selective nerve root block to be performed at time return appointment  F/U PCP  Dr. Brynda Greathouse for evaliation of  BP and general medical  condition  F/U surgical evaluation as discussed  F/U neurological evaluation. May consider [facility] EMG studies and other studies pending follow-up evaluation response to present treatment  May consider additional radiofrequency rhizolysis lumbar facet, medial branch nerves pending response to treatment and follow-up evaluation  Patient to call Pain Management Center should patient have concerns prior to scheduled return appointment.

## 2015-04-18 NOTE — Progress Notes (Signed)
Safety precautions to be maintained throughout the outpatient stay will include: orient to surroundings, keep bed in low position, maintain call bell within reach at all times, provide assistance with transfer out of bed and ambulation.  

## 2015-04-18 NOTE — Patient Instructions (Addendum)
Continue present medication Zanaflex and oxycodone  Lumbosacral selective nerve root block to be performed at time of return appointment  F/U PCP Dr. Brynda Greathouse for evaliation of  BP and general medical  condition  F/U surgical evaluation. May consider pending follow-up evaluations  F/U neurological evaluation. May consider pending follow-up evaluations  May consider radiofrequency rhizolysis or intraspinal procedures pending response to present treatment and F/U evaluation   Patient to call [facility] Management Center should patient have concerns prior to scheduled return appointment.Pain Management Discharge Instructions  General Discharge Instructions :  If you need to reach your doctor call: Monday-Friday 8:00 am - 4:00 pm at [phone] or toll free [phone].  After clinic hours [phone] to have operator reach doctor.  Bring all of your medication bottles to all your appointments in the pain clinic.  To cancel or reschedule your appointment with Pain Management please remember to call 24 hours in advance to avoid a fee.  Refer to the educational materials which you have been given on: General Risks, I had my Procedure. Discharge Instructions, Post Sedation.  Post Procedure Instructions:  The drugs you were given will stay in your system until tomorrow, so for the next 24 hours you should not drive, make any legal decisions or drink any alcoholic beverages.  You may eat anything you prefer, but it is better to start with liquids then soups and crackers, and gradually work up to solid foods.  Please notify your doctor immediately if you have any unusual bleeding, trouble breathing or pain that is not related to your normal pain.  Depending on the type of procedure that was done, some parts of your body may feel week and/or numb.  This usually clears up by tonight or the next day.  Walk with the use of an assistive device or accompanied by an adult for the 24 hours.  You  may use ice on the affected area for the first 24 hours.  Put ice in a Ziploc bag and cover with a towel and place against area 15 minutes on 15 minutes off.  You may switch to heat after 24 hours.GENERAL RISKS AND COMPLICATIONS  What are the risk, side effects and possible complications? Generally speaking, most procedures are safe.  However, with any procedure there are risks, side effects, and the possibility of complications.  The risks and complications are dependent upon the sites that are lesioned, or the type of nerve block to be performed.  The closer the procedure is to the spine, the more serious the risks are.  Great care is taken when placing the radio frequency needles, block needles or lesioning probes, but sometimes complications can occur. 1. Infection: Any time there is an injection through the skin, there is a risk of infection.  This is why sterile conditions are used for these blocks.  There are four possible types of infection. 1. Localized skin infection. 2. Central Nervous System Infection-This can be in the form of Meningitis, which can be deadly. 3. Epidural Infections-This can be in the form of an epidural abscess, which can cause pressure inside of the spine, causing compression of the spinal cord with subsequent paralysis. This would require an emergency surgery to decompress, and there are no guarantees that the patient would recover from the paralysis. 4. Discitis-This is an infection of the intervertebral discs.  It occurs in about 1% of discography procedures.  It is difficult to treat and it may lead to surgery.  2. Pain: the needles have to go through skin and soft tissues, will cause soreness.       3. Damage to internal structures:  The nerves to be lesioned may be near blood vessels or    other nerves which can be potentially damaged.       4. Bleeding: Bleeding is more common if the patient is taking blood thinners such as  aspirin, Coumadin, Ticiid, Plavix,  etc., or if he/she have some genetic predisposition  such as hemophilia. Bleeding into the spinal canal can cause compression of the spinal  cord with subsequent paralysis.  This would require an emergency surgery to  decompress and there are no guarantees that the patient would recover from the  paralysis.       5. Pneumothorax:  Puncturing of a lung is a possibility, every time a needle is introduced in  the area of the chest or upper back.  Pneumothorax refers to free air around the  collapsed lung(s), inside of the thoracic cavity (chest cavity).  Another two possible  complications related to a similar event would include: Hemothorax and Chylothorax.   These are variations of the Pneumothorax, where instead of air around the collapsed  lung(s), you may have blood or chyle, respectively.       6. Spinal headaches: They may occur with any procedures in the area of the spine.       7. Persistent CSF (Cerebro-Spinal Fluid) leakage: This is a rare problem, but may occur  with prolonged intrathecal or epidural catheters either due to the formation of a fistulous  track or a dural tear.       8. Nerve damage: By working so close to the spinal cord, there is always a possibility of  nerve damage, which could be as serious as a permanent spinal cord injury with  paralysis.       9. Death:  Although rare, severe deadly allergic reactions known as "Anaphylactic  reaction" can occur to any of the medications used.      10. Worsening of the symptoms:  We can always make thing worse.  What are the chances of something like this happening? Chances of any of this occuring are extremely low.  By statistics, you have more of a chance of getting killed in a motor vehicle accident: while driving to the hospital than any of the above occurring .  Nevertheless, you should be aware that they are possibilities.  In general, it is similar to taking a shower.  Everybody knows that you can slip, hit your head and get killed.   Does that mean that you should not shower again?  Nevertheless always keep in mind that statistics do not mean anything if you happen to be on the wrong side of them.  Even if a procedure has a 1 (one) in a 1,000,000 (million) chance of going wrong, it you happen to be that one..Also, keep in mind that by statistics, you have more of a chance of having something go wrong when taking medications.  Who should not have this procedure? If you are on a blood thinning medication (e.g. Coumadin, Plavix, see list of "Blood Thinners"), or if you have an active infection going on, you should not have the procedure.  If you are taking any blood thinners, please inform your physician.  How should I prepare for this procedure?  Do not eat or drink anything at least six hours prior to the procedure.  Bring a driver  with you .  It cannot be a taxi.  Come accompanied by an adult that can drive you back, and that is strong enough to help you if your legs get weak or numb from the local anesthetic.  Take all of your medicines the morning of the procedure with just enough water to swallow them.  If you have diabetes, make sure that you are scheduled to have your procedure done first thing in the morning, whenever possible.  If you have diabetes, take only half of your insulin dose and notify our nurse that you have done so as soon as you arrive at the clinic.  If you are diabetic, but only take blood sugar pills (oral hypoglycemic), then do not take them on the morning of your procedure.  You may take them after you have had the procedure.  Do not take aspirin or any aspirin-containing medications, at least eleven (11) days prior to the procedure.  They may prolong bleeding.  Wear loose fitting clothing that may be easy to take off and that you would not mind if it got stained with Betadine or blood.  Do not wear any jewelry or perfume  Remove any nail coloring.  It will interfere with some of our monitoring  equipment.  NOTE: Remember that this is not meant to be interpreted as a complete list of all possible complications.  Unforeseen problems may occur.  BLOOD THINNERS The following drugs contain aspirin or other products, which can cause increased bleeding during surgery and should not be taken for 2 weeks prior to and 1 week after surgery.  If you should need take something for relief of minor pain, you may take acetaminophen which is found in Tylenol,m Datril, Anacin-3 and Panadol. It is not blood thinner. The products listed below are.  Do not take any of the products listed below in addition to any listed on your instruction sheet.  A.P.C or A.P.C with Codeine Codeine Phosphate Capsules #3 Ibuprofen Ridaura  ABC compound Congesprin Imuran rimadil  Advil Cope Indocin Robaxisal  Alka-Seltzer Effervescent Pain Reliever and Antacid Coricidin or Coricidin-D  Indomethacin Rufen  Alka-Seltzer plus Cold Medicine Cosprin Ketoprofen S-A-C Tablets  Anacin Analgesic Tablets or Capsules Coumadin Korlgesic Salflex  Anacin Extra Strength Analgesic tablets or capsules CP-2 Tablets Lanoril Salicylate  Anaprox Cuprimine Capsules Levenox Salocol  Anexsia-D Dalteparin Magan Salsalate  Anodynos Darvon compound Magnesium Salicylate Sine-off  Ansaid Dasin Capsules Magsal Sodium Salicylate  Anturane Depen Capsules Marnal Soma  APF Arthritis pain formula Dewitt's Pills Measurin Stanback  Argesic Dia-Gesic Meclofenamic Sulfinpyrazone  Arthritis Bayer Timed Release Aspirin Diclofenac Meclomen Sulindac  Arthritis pain formula Anacin Dicumarol Medipren Supac  Analgesic (Safety coated) Arthralgen Diffunasal Mefanamic Suprofen  Arthritis Strength Bufferin Dihydrocodeine Mepro Compound Suprol  Arthropan liquid Dopirydamole Methcarbomol with Aspirin Synalgos  ASA tablets/Enseals Disalcid Micrainin Tagament  Ascriptin Doan's Midol Talwin  Ascriptin A/D Dolene Mobidin Tanderil  Ascriptin Extra Strength Dolobid  Moblgesic Ticlid  Ascriptin with Codeine Doloprin or Doloprin with Codeine Momentum Tolectin  Asperbuf Duoprin Mono-gesic Trendar  Aspergum Duradyne Motrin or Motrin IB Triminicin  Aspirin plain, buffered or enteric coated Durasal Myochrisine Trigesic  Aspirin Suppositories Easprin Nalfon Trillsate  Aspirin with Codeine Ecotrin Regular or Extra Strength Naprosyn Uracel  Atromid-S Efficin Naproxen Ursinus  Auranofin Capsules Elmiron Neocylate Vanquish  Axotal Emagrin Norgesic Verin  Azathioprine Empirin or Empirin with Codeine Normiflo Vitamin E  Azolid Emprazil Nuprin Voltaren  Bayer Aspirin plain, buffered or children's or timed BC Tablets or powders  Encaprin Orgaran Warfarin Sodium  Buff-a-Comp Enoxaparin Orudis Zorpin  Buff-a-Comp with Codeine Equegesic Os-Cal-Gesic   Buffaprin Excedrin plain, buffered or Extra Strength Oxalid   Bufferin Arthritis Strength Feldene Oxphenbutazone   Bufferin plain or Extra Strength Feldene Capsules Oxycodone with Aspirin   Bufferin with Codeine Fenoprofen Fenoprofen Pabalate or Pabalate-SF   Buffets II Flogesic Panagesic   Buffinol plain or Extra Strength Florinal or Florinal with Codeine Panwarfarin   Buf-Tabs Flurbiprofen Penicillamine   Butalbital Compound Four-way cold tablets Penicillin   Butazolidin Fragmin Pepto-Bismol   Carbenicillin Geminisyn Percodan   Carna Arthritis Reliever Geopen Persantine   Carprofen Gold's salt Persistin   Chloramphenicol Goody's Phenylbutazone   Chloromycetin Haltrain Piroxlcam   Clmetidine heparin Plaquenil   Cllnoril Hyco-pap Ponstel   Clofibrate Hydroxy chloroquine Propoxyphen         Before stopping any of these medications, be sure to consult the physician who ordered them.  Some, such as Coumadin (Warfarin) are ordered to prevent or treat serious conditions such as "deep thrombosis", "pumonary embolisms", and other heart problems.  The amount of time that you may need off of the medication may also vary with  the medication and the reason for which you were taking it.  If you are taking any of these medications, please make sure you notify your pain physician before you undergo any procedures.         Selective Nerve Root Block Patient Information  Description: Specific nerve roots exit the spinal canal and these nerves can be compressed and inflamed by a bulging disc and bone spurs.  By injecting steroids on the nerve root, we can potentially decrease the inflammation surrounding these nerves, which often leads to decreased pain.  Also, by injecting local anesthesia on the nerve root, this can provide [place] helpful information to give to your referring doctor if it decreases your pain.  Selective nerve root blocks can be done along the spine from the neck to the low back depending on the location of your pain.   After numbing the skin with local anesthesia, a small needle is passed to the nerve root and the position of the needle is verified using x-ray pictures.  After the needle is in correct position, we then deposit the medication.  You may experience a pressure sensation while this is being done.  The entire block usually lasts less than 15 minutes.  Conditions that may be treated with selective nerve root blocks:  Low back and leg pain  Spinal stenosis  Diagnostic block prior to potential surgery  Neck and arm pain  Post laminectomy syndrome  Preparation for the injection:  1. Do not eat any solid food or dairy products within 6 hours of your appointment. 2. You may drink clear liquids up to 2 hours before an appointment.  Clear liquids include water, black coffee, juice or soda.  No milk or cream please. 3. You may take your regular medications, including pain medications, with a sip of water before your appointment.  Diabetics should hold regular insulin (if taken separately) and take 1/2 normal NPH dose the morning of the procedure.  Carry some sugar containing items with you to your  appointment. 4. A driver must accompany you and be prepared to drive you home after your procedure. 5. Bring all your current medications with you. 6. An IV may be inserted and sedation may be given at the discretion of the physician. 7. A blood pressure cuff, EKG, and other monitors will often be  applied during the procedure.  Some patients may need to have extra oxygen administered for a short period. 8. You will be asked to provide medical information, including allergies, prior to the procedure.  We must know immediately if you are taking blood  Thinners (like Coumadin) or if you are allergic to IV iodine contrast (dye).  Possible side-effects: All are usually temporary  Bleeding from needle site  Light headedness  Numbness and tingling  Decreased blood pressure  Weakness in arms/legs  Pressure sensation in back/neck  Pain at injection site (several days)  Possible complications: All are extremely rare  Infection  Nerve injury  Spinal headache (a headache wore with upright position)  Call if you experience:  Fever/chills associated with headache or increased back/neck pain  Headache worsened by an upright position  New onset weakness or numbness of an extremity below the injection site  Hives or difficulty breathing (go to the emergency room)  Inflammation or drainage at the injection site(s)  Severe back/neck pain greater than usual  New symptoms which are concerning to you  Please note:  Although the local anesthetic injected can often make your back or neck feel good for several hours after the injection the pain will likely return.  It takes 3-5 days for steroids to work on the nerve root. You may not notice any pain relief for at least one week.  If effective, we will often do a series of 3 injections spaced 3-6 weeks apart to maximally decrease your pain.    If you have any questions, please call [phone] [facility]

## 2015-04-30 ENCOUNTER — Telehealth: Payer: Self-pay | Admitting: Pain Medicine

## 2015-04-30 ENCOUNTER — Ambulatory Visit: Payer: Medicare Other | Admitting: Pain Medicine

## 2015-04-30 NOTE — Telephone Encounter (Signed)
Thank you :)

## 2015-04-30 NOTE — Telephone Encounter (Signed)
Cancel appt / father passed away / she will call to resched

## 2015-05-02 DIAGNOSIS — S92902A Unspecified fracture of left foot, initial encounter for closed fracture: Secondary | ICD-10-CM

## 2015-05-02 HISTORY — DX: Unspecified fracture of left foot, initial encounter for closed fracture: S92.902A

## 2015-05-03 ENCOUNTER — Emergency Department
Admission: EM | Admit: 2015-05-03 | Discharge: 2015-05-03 | Disposition: A | Discharge status: Home / Self Care | Payer: Medicare Other | Attending: Emergency Medicine | Admitting: Emergency Medicine

## 2015-05-03 ENCOUNTER — Encounter: Payer: Self-pay | Admitting: Emergency Medicine

## 2015-05-03 ENCOUNTER — Emergency Department: Payer: Medicare Other

## 2015-05-03 DIAGNOSIS — S92352A Displaced fracture of fifth metatarsal bone, left foot, initial encounter for closed fracture: Secondary | ICD-10-CM | POA: Diagnosis not present

## 2015-05-03 DIAGNOSIS — Z791 Long term (current) use of non-steroidal anti-inflammatories (NSAID): Secondary | ICD-10-CM | POA: Insufficient documentation

## 2015-05-03 DIAGNOSIS — Y9389 Activity, other specified: Secondary | ICD-10-CM | POA: Diagnosis not present

## 2015-05-03 DIAGNOSIS — Y92000 Kitchen of unspecified non-institutional (private) residence as  the place of occurrence of the external cause: Secondary | ICD-10-CM | POA: Insufficient documentation

## 2015-05-03 DIAGNOSIS — S99922A Unspecified injury of left foot, initial encounter: Secondary | ICD-10-CM | POA: Diagnosis present

## 2015-05-03 DIAGNOSIS — X501XXA Overexertion from prolonged static or awkward postures, initial encounter: Secondary | ICD-10-CM | POA: Insufficient documentation

## 2015-05-03 DIAGNOSIS — Z7951 Long term (current) use of inhaled steroids: Secondary | ICD-10-CM | POA: Diagnosis not present

## 2015-05-03 DIAGNOSIS — S92342A Displaced fracture of fourth metatarsal bone, left foot, initial encounter for closed fracture: Secondary | ICD-10-CM | POA: Diagnosis not present

## 2015-05-03 DIAGNOSIS — Z79899 Other long term (current) drug therapy: Secondary | ICD-10-CM | POA: Insufficient documentation

## 2015-05-03 DIAGNOSIS — Y998 Other external cause status: Secondary | ICD-10-CM | POA: Diagnosis not present

## 2015-05-03 DIAGNOSIS — F17210 Nicotine dependence, cigarettes, uncomplicated: Secondary | ICD-10-CM | POA: Diagnosis not present

## 2015-05-03 DIAGNOSIS — Z880 Allergy status to penicillin: Secondary | ICD-10-CM | POA: Diagnosis not present

## 2015-05-03 DIAGNOSIS — S92302A Fracture of unspecified metatarsal bone(s), left foot, initial encounter for closed fracture: Secondary | ICD-10-CM

## 2015-05-03 MED ORDER — PREDNISONE 10 MG PO TABS: 10.0000 mg | ORAL_TABLET | Freq: Two times a day (BID) | ORAL | Status: DC

## 2015-05-03 MED ORDER — OXYCODONE-ACETAMINOPHEN 5-325 MG PO TABS
1.0000 | ORAL_TABLET | Freq: Once | ORAL | Status: AC
  Administered 2015-05-03: 1 via ORAL
  Filled 2015-05-03: qty 1

## 2015-05-03 MED ORDER — TRAMADOL HCL 50 MG PO TABS: 50.0000 mg | ORAL_TABLET | Freq: Three times a day (TID) | ORAL | Status: DC | PRN

## 2015-05-03 NOTE — ED Provider Notes (Signed)
[facility] Emergency Department Provider Note ____________________________________________  Time seen: [phone]  I have reviewed the triage vital signs and the nursing notes.  HISTORY  Chief Complaint  Foot Pain  HPI Janet Murray is a 44 y.o. female reports to the ED for evaluation ofleft foot pain after she stepped out of the chair wrong this morning. She describes present in the chair in the kitchen to stop the, when she stepped down out of the chair she inadvertently twisted her foot. She is complaining of lateral and dorsal foot pain on the left. She denies any other injury, cut, scrape abrasion, or head injury. She rates her foot pain at a 10/10 in triage.  Past Medical History  Diagnosis Date  . Urinary incontinence   . Arthritis   . GERD (gastroesophageal reflux disease)   . Chronic pain   . Bronchitis   . Degenerative joint disease (DJD) of hip     Patient Active Problem List   Diagnosis Date Noted  . Lumbar radiculopathy 11/08/2014  . Hereditary and idiopathic peripheral neuropathy 11/06/2014  . 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 Facet syndrome, lumbar 10/25/2014  . Sacroiliac joint dysfunction of both sides 10/25/2014  . Carpal tunnel syndrome of left wrist 10/25/2014  . Occipital neuralgia 10/25/2014  . Restless leg syndrome 10/25/2014  . DDD (degenerative disc disease), lumbar 10/25/2014    Past Surgical History  Procedure Laterality Date  . Bladder surgery    . Cesarean section    . Appendectomy    . Abdominal hysterectomy      Current Outpatient Rx  Name  Route  Sig  Dispense  Refill  . albuterol (PROVENTIL HFA;VENTOLIN HFA) 108 (90 BASE) MCG/ACT inhaler   Inhalation   Inhale 2 puffs into the lungs as needed for wheezing or shortness of breath.         . budesonide-formoterol (SYMBICORT) 80-4.5 MCG/ACT inhaler   Inhalation   Inhale 2 puffs into the lungs 2 (two) times daily.         Marland Kitchen  esomeprazole (NEXIUM) 40 MG capsule   Oral   Take 40 mg by mouth 2 (two) times daily.         Marland Kitchen HYDROcodone-acetaminophen (NORCO) 5-325 MG per tablet   Oral   Take 1 tablet by mouth every 6 (six) hours as needed for moderate pain.   15 tablet   0   . levothyroxine (SYNTHROID, LEVOTHROID) 50 MCG tablet   Oral   Take 50 mcg by mouth daily.         Marland Kitchen LORazepam (ATIVAN) 0.5 MG tablet   Oral   Take 0.5 mg by mouth every 8 (eight) hours as needed for anxiety.         . meloxicam (MOBIC) 15 MG tablet   Oral   Take 15 mg by mouth daily.         . montelukast (SINGULAIR) 10 MG tablet   Oral   Take 10 mg by mouth at bedtime.         . ondansetron (ZOFRAN) 4 MG tablet   Oral   Take 1 tablet (4 mg total) by mouth every 8 (eight) hours as needed for nausea or vomiting.   15 tablet   0   . oxyCODONE-acetaminophen (PERCOCET/ROXICET) 5-325 MG tablet      Limit one half to 1 tab by mouth per day or twice a day if tolerated   60 tablet   0   .  phentermine 30 MG capsule   Oral   Take 30 mg by mouth 2 (two) times daily.         . pramipexole (MIRAPEX) 0.5 MG tablet   Oral   Take 0.5 mg by mouth at bedtime.         . predniSONE (DELTASONE) 10 MG tablet   Oral   Take 1 tablet (10 mg total) by mouth 2 (two) times daily with a meal.   10 tablet   0   . solifenacin (VESICARE) 5 MG tablet   Oral   Take 5 mg by mouth daily.         . tizanidine (ZANAFLEX) 2 MG capsule      Limit 1 to 2 tabs by mouth per day or twice a day if tolerated   120 capsule   0   . topiramate (TOPAMAX) 25 MG capsule   Oral   Take 25 mg by mouth daily. 1-2 tablets         . traMADol (ULTRAM) 50 MG tablet   Oral   Take 1 tablet (50 mg total) by mouth 3 (three) times daily as needed.   15 tablet   0    Allergies Penicillins; Hydrocodone; Aspirin; and Tape  Family History  Problem Relation Age of Onset  . Arthritis Father   . Heart disease Father   . Hypertension Father      Social History Social History  Substance Use Topics  . Smoking status: Current Every Day Smoker -- 1.00 packs/day for 20 years    Types: Cigarettes  . Smokeless tobacco: Never Used  . Alcohol Use: No   Review of Systems  Constitutional: Negative for fever. Eyes: Negative for visual changes. ENT: Negative for sore throat. Cardiovascular: Negative for chest pain. Respiratory: Negative for shortness of breath. Gastrointestinal: Negative for abdominal pain, vomiting and diarrhea. Genitourinary: Negative for dysuria. Musculoskeletal: Negative for back pain. Left foot pain as above. Skin: Negative for rash. Neurological: Negative for headaches, focal weakness or numbness. ____________________________________________  PHYSICAL EXAM:  VITAL SIGNS: ED Triage Vitals  Enc Vitals Group     BP 05/03/15 0815 137/86 mmHg     Pulse Rate 05/03/15 0815 78     Resp 05/03/15 0815 18     Temp 05/03/15 0815 97.7 F (36.5 C)     Temp Source 05/03/15 0815 Oral     SpO2 05/03/15 0815 100 %     Weight 05/03/15 0815 163 lb (73.936 kg)     Height 05/03/15 0815 5\' 1"  (1.549 m)     Head Cir --      Peak Flow --      Pain Score 05/03/15 0816 10     Pain Loc --      Pain Edu? --      Excl. in [place]? --    Constitutional: Alert and oriented. Well appearing and in no distress. Head: Normocephalic and atraumatic.      Eyes: Conjunctivae are normal. PERRL. Normal extraocular movements      Ears: Canals clear. TMs intact bilaterally.   Nose: No congestion/rhinorrhea.   Mouth/Throat: Mucous membranes are moist.   Neck: Supple. No thyromegaly. Hematological/Lymphatic/Immunological: No cervical lymphadenopathy. Cardiovascular: Normal rate, regular rhythm. Normal distal pulses Respiratory: Normal respiratory effort. No wheezes/rales/rhonchi. Gastrointestinal: Soft and nontender. No distention. Musculoskeletal: Left foot without obvious deformity, abrasion, or ecchymosis. Patient with minimal  erythema to the dorsolateral aspect of the left foot. She is minimally tender to palpate over the  dorsal first and second MTs and the fifth MTP. Normal ankle ROM. No calf or achilles tenderness. Nontender with normal range of motion in all extremities.  Neurologic:  Normal gait without ataxia. Normal speech and language. No gross focal neurologic deficits are appreciated. Skin:  Skin is warm, dry and intact. No rash noted. Psychiatric: Mood and affect are normal. Patient exhibits appropriate insight and judgment. ____________________________________________   RADIOLOGY  Left Foot  FINDINGS: Displaced fractures are seen within the distal aspects of the left fourth and fifth metatarsal bones, with approximately 4 mm displacement at each fracture site. These fracture lines do NOT appear to extend to the articular surfaces of the distal Metatarsals  I, Amalio Loe, Dannielle Karvonen, personally viewed and evaluated these images (plain radiographs) as part of my medical decision making.  ____________________________________________  PROCEDURES  Posterior Short Leg OCL Crutches ____________________________________________  INITIAL IMPRESSION / ASSESSMENT AND PLAN / ED COURSE  Left foot injury with radiologic evidence of fracture to the 4th & 5th MT. Initial fracture care provided. She is encouraged to dose the prescription prednisone and Ultram in addition to her daily oxycodone (for unrelated chronic pain). She is to follow-up with in the interim with Dr. Brynda Greathouse for ongoing problems. She is given RICE instructions for management of her foot sprain. She is encouraged to return to the ED as needed. ____________________________________________  FINAL CLINICAL IMPRESSION(S) / ED DIAGNOSES  Final diagnoses:  Metatarsal fracture, left, closed, initial encounter      Melvenia Needles, PA-C 05/03/15 [place], MD 05/03/15 1353

## 2015-05-03 NOTE — Discharge Instructions (Signed)
Metatarsal Fracture A metatarsal fracture is a break in a metatarsal bone. Metatarsal bones connect your toe bones to your ankle bones. CAUSES This type of fracture may be caused by:  A sudden twisting of your foot.  A fall onto your foot.  Overuse or repetitive exercise. RISK FACTORS This condition is more likely to develop in people who:  Play contact sports.  Have a bone disease.  Have a low calcium level. SYMPTOMS Symptoms of this condition include:  Pain that is worse when walking or standing.  Pain when pressing on the foot or moving the toes.  Swelling.  Bruising on the top or bottom of the foot.  A foot that appears shorter than the other one. DIAGNOSIS This condition is diagnosed with a physical exam. You may also have imaging tests, such as:  X-rays.  A CT scan.  MRI. TREATMENT Treatment for this condition depends on its severity and whether a bone has moved out of place. Treatment may involve:  Rest.  Wearing foot support such as a cast, splint, or boot for several weeks.  Using crutches.  Surgery to move bones back into the right position. Surgery is usually needed if there are many pieces of broken bone or bones that are very out of place (displaced fracture).  Physical therapy. This may be needed to help you regain full movement and strength in your foot. You will need to return to your health care provider to have X-rays taken until your bones heal. Your health care provider will look at the X-rays to make sure that your foot is healing well. HOME CARE INSTRUCTIONS  If You Have a Cast:  Do not stick anything inside the cast to scratch your skin. Doing that increases your risk of infection.  Check the skin around the cast every day. Report any concerns to your health care provider. You may put lotion on dry skin around the edges of the cast. Do not apply lotion to the skin underneath the cast.  Keep the cast clean and dry. If You Have a Splint  or a Supportive Boot:  Wear it as directed by your health care provider. Remove it only as directed by your health care provider.  Loosen it if your toes become numb and tingle, or if they turn cold and blue.  Keep it clean and dry. Bathing  Do not take baths, swim, or use a hot tub until your health care provider approves. Ask your health care provider if you can take showers. You may only be allowed to take sponge baths for bathing.  If your health care provider approves bathing and showering, cover the cast or splint with a watertight plastic bag to protect it from water. Do not let the cast or splint get wet. Managing Pain, Stiffness, and Swelling  If directed, apply ice to the injured area (if you have a splint, not a cast).  Put ice in a plastic bag.  Place a towel between your skin and the bag.  Leave the ice on for 20 minutes, 2-3 times per day.  Move your toes often to avoid stiffness and to lessen swelling.  Raise (elevate) the injured area above the level of your heart while you are sitting or lying down. Driving  Do not drive or operate heavy machinery while taking pain medicine.  Do not drive while wearing foot support on a foot that you use for driving. Activity  Return to your normal activities as directed by your health care  provider. Ask your health care provider what activities are safe for you.  Perform exercises as directed by your health care provider or physical therapist. Safety  Do not use the injured foot to support your body weight until your health care provider says that you can. Use crutches as directed by your health care provider. General Instructions  Do not put pressure on any part of the cast or splint until it is fully hardened. This may take several hours.  Do not use any tobacco products, including cigarettes, chewing tobacco, or e-cigarettes. Tobacco can delay bone healing. If you need help quitting, ask your health care  provider.  Take medicines only as directed by your health care provider.  Keep all follow-up visits as directed by your health care provider. This is important. SEEK MEDICAL CARE IF:  You have a fever.  Your cast, splint, or boot is too loose or too tight.  Your cast, splint, or boot is damaged.  Your pain medicine is not helping.  You have pain, tingling, or numbness in your foot that is not going away. SEEK IMMEDIATE MEDICAL CARE IF:  You have severe pain.  You have tingling or numbness in your foot that is getting worse.  Your foot feels cold or becomes numb.  Your foot changes color.   This information is not intended to replace advice given to you by your health care provider. Make sure you discuss any questions you have with your health care provider.   Document Released: 02/15/2002 Document Revised: 10/10/2014 Document Reviewed: 03/22/2014 Elsevier Interactive Patient Education 2016 [place] or Splint Care Casts and splints support injured limbs and keep bones from moving while they heal.  HOME CARE  Keep the cast or splint uncovered during the drying period.  A plaster cast can take 24 to 48 hours to dry.  A fiberglass cast will dry in less than 1 hour.  Do not rest the cast on anything harder than a pillow for 24 hours.  Do not put weight on your injured limb. Do not put pressure on the cast. Wait for your doctor's approval.  Keep the cast or splint dry.  Cover the cast or splint with a plastic bag during baths or wet weather.  If you have a cast over your chest and belly (trunk), take sponge baths until the cast is taken off.  If your cast gets wet, dry it with a towel or blow dryer. Use the cool setting on the blow dryer.  Keep your cast or splint clean. Wash a dirty cast with a damp cloth.  Do not put any objects under your cast or splint.  Do not scratch the skin under the cast with an object. If itching is a problem, use a blow dryer  on a cool setting over the itchy area.  Do not trim or cut your cast.  Do not take out the padding from inside your cast.  Exercise your joints near the cast as told by your doctor.  Raise (elevate) your injured limb on 1 or 2 pillows for the first 1 to 3 days. GET HELP IF:  Your cast or splint cracks.  Your cast or splint is too tight or too loose.  You itch badly under the cast.  Your cast gets wet or has a soft spot.  You have a bad smell coming from the cast.  You get an object stuck under the cast.  Your skin around the cast becomes red or sore.  You have new or more pain after the cast is put on. GET HELP RIGHT AWAY IF:  You have fluid leaking through the cast.  You cannot move your fingers or toes.  Your fingers or toes turn blue or white or are cool, painful, or puffy (swollen).  You have tingling or lose feeling (numbness) around the injured area.  You have bad pain or pressure under the cast.  You have trouble breathing or have shortness of breath.  You have chest pain.   This information is not intended to replace advice given to you by your health care provider. Make sure you discuss any questions you have with your health care provider.   Document Released: 09/25/2010 Document Revised: 01/26/2013 Document Reviewed: 12/02/2012 Elsevier Interactive Patient Education 2016 [place] the splint clean and dry.  Take the prescription meds in addition to your daily pain medicine for pain and inflammation.  Follow-up with Dr. Elvina Mattes for further care of your foot fracture. Apply ice through the splint. Keep the foot elevated when seated.

## 2015-05-03 NOTE — ED Notes (Signed)
Pt states she was standing in a chair to get something and when she stepped down she stepped on left foot wrong and she felt pain to left foot

## 2015-05-17 ENCOUNTER — Ambulatory Visit: Payer: Medicare Other | Attending: Pain Medicine | Admitting: Pain Medicine

## 2015-05-17 ENCOUNTER — Encounter: Payer: Self-pay | Admitting: Pain Medicine

## 2015-05-17 VITALS — BP 100/69 | HR 72 | Temp 98.1°F | Resp 16 | Ht 61.0 in | Wt 168.0 lb

## 2015-05-17 DIAGNOSIS — G5600 Carpal tunnel syndrome, unspecified upper limb: Secondary | ICD-10-CM | POA: Insufficient documentation

## 2015-05-17 DIAGNOSIS — M659 Synovitis and tenosynovitis, unspecified: Secondary | ICD-10-CM | POA: Diagnosis not present

## 2015-05-17 DIAGNOSIS — M4806 Spinal stenosis, lumbar region: Secondary | ICD-10-CM | POA: Insufficient documentation

## 2015-05-17 DIAGNOSIS — G2581 Restless legs syndrome: Secondary | ICD-10-CM

## 2015-05-17 DIAGNOSIS — M4726 Other spondylosis with radiculopathy, lumbar region: Secondary | ICD-10-CM

## 2015-05-17 DIAGNOSIS — M533 Sacrococcygeal disorders, not elsewhere classified: Secondary | ICD-10-CM

## 2015-05-17 DIAGNOSIS — M5481 Occipital neuralgia: Secondary | ICD-10-CM

## 2015-05-17 DIAGNOSIS — M5416 Radiculopathy, lumbar region: Secondary | ICD-10-CM

## 2015-05-17 DIAGNOSIS — M79604 Pain in right leg: Secondary | ICD-10-CM | POA: Diagnosis present

## 2015-05-17 DIAGNOSIS — G609 Hereditary and idiopathic neuropathy, unspecified: Secondary | ICD-10-CM

## 2015-05-17 DIAGNOSIS — M545 Low back pain: Secondary | ICD-10-CM | POA: Diagnosis present

## 2015-05-17 DIAGNOSIS — G5602 Carpal tunnel syndrome, left upper limb: Secondary | ICD-10-CM

## 2015-05-17 DIAGNOSIS — M79605 Pain in left leg: Secondary | ICD-10-CM | POA: Diagnosis present

## 2015-05-17 DIAGNOSIS — M1610 Unilateral primary osteoarthritis, unspecified hip: Secondary | ICD-10-CM | POA: Diagnosis not present

## 2015-05-17 DIAGNOSIS — M5136 Other intervertebral disc degeneration, lumbar region: Secondary | ICD-10-CM

## 2015-05-17 DIAGNOSIS — M160 Bilateral primary osteoarthritis of hip: Secondary | ICD-10-CM

## 2015-05-17 DIAGNOSIS — M47896 Other spondylosis, lumbar region: Secondary | ICD-10-CM | POA: Insufficient documentation

## 2015-05-17 DIAGNOSIS — M47816 Spondylosis without myelopathy or radiculopathy, lumbar region: Secondary | ICD-10-CM

## 2015-05-17 MED ORDER — OXYCODONE-ACETAMINOPHEN 5-325 MG PO TABS: ORAL_TABLET | ORAL | Status: DC

## 2015-05-17 MED ORDER — TIZANIDINE HCL 2 MG PO CAPS: ORAL_CAPSULE | ORAL | Status: DC

## 2015-05-17 MED ORDER — TIZANIDINE HCL 2 MG PO CAPS
ORAL_CAPSULE | ORAL | Status: DC
Start: 2015-05-17 — End: 2015-06-14

## 2015-05-17 NOTE — Progress Notes (Signed)
Patient had metal cans fall on her left foot on 05/02/15 and went to the ED on 05/03/15. Diagnosed as displaced fracture of the 4th and 5th metatarsal bone left foot. Given Tramadol and Prednisone.

## 2015-05-17 NOTE — Progress Notes (Signed)
Subjective:    Patient ID: Janet Murray, female    DOB: 26-May-1971, 44 y.o.   MRN: [id number]  HPI  Patient is a 44 year old female returns to pain management Center for further evaluation and treatment of pain involving the lower back and lower extremity regions predominantly. The patient states that she was at [facility] and that employee at [facility] was attempting to show attends that were on a pallet. The patient states that can spell of the palate management on the patient's foot fracturing the left foot. The patient is scheduled to undergo surgical intervention of the left foot by Dr. Elvina Mattes. We discussed patient's condition and will continue present medications including oxycodone and Zanaflex and we'll remain available to consider additional modifications of treatment pending response to surgery and follow-up evaluation in pain management Center. The patient was understanding and in agreement with suggested treatment plan. At the present time we will avoid interventional treatment of patient's condition.    PHYSICAL EXAMIN  There was palpation over the lumbar paraspinal must reason lumbar facet region was attends to palpation of moderate degree with lateral bending rotation extension and palpation lumbar facets reproducing moderate discomfort. Palpation of the PSIS and PII S region reproduces moderate discomfort as well there was severe tenderness to palpation of the left lower extremity with evidence of swelling and discoloration of the left foot. There was decreased EHL strength of the left compared to the right there is a areas of hypersensitivity to touch of the left lower extremity. Abdomen nontender with no costovertebral tenderness noted there was negative Homans tenderness to palpation of paraspinal muscular region cervical region cervical facet region palpation which reproduces minimal discomfort. There was minimal tenderness to palpation of the splenius capitis and occipitalis  musculature region. Palpation of the acromioclavicular and glenohumeral joint regions reproduced pain of moderate degree and there was no tennis to palpation of the upper thoracic region of significant degree. Palpation of the mid and lower thoracic regions were tenderness to palpation of moderate degree. Patient appeared to be with decreased grip strength on the left compared to the right and Tinel and Phalen's maneuver associated with moderate discomfort. No crepitus of the thoracic region was noted.     Review of Systems     Objective:   Physical Exam        Assessment & Plan:       Subjective:    Patient ID: Janet Murray, female    DOB: Sep 08, 1970, 44 y.o.   MRN: [id number]  HPI Patient is 44 year old female returns to pain management Center for further evaluation and treatment of pain involving the lower back lower extremity region with pain of the neck of lesser degree. Patient states she has had some improvement of pain following radiofrequency rhizolysis lumbar facet, medial branch nerve,. Patient appears to be experiencing postprocedure pain at this time and the lumbar region with significant inflammatory response. We discussed patient's condition and will proceed with interventional treatment consisting of lumbosacral selective nerve root block and myoneural block injections of the paraspinal musculature region lumbar region to decrease severity of patient's symptoms, minimize progression of symptoms, and avoid need for more involved treatment. The patient was understanding and agree suggested treatment plan. We will continue Zanaflex and oxycodone as prescribed.   Review of Systems     Objective:   Physical Exam   There was mild tenderness of the splenius capitis and occipitalis musculature region. There was mild tenderness of the cervical facet cervical paraspinal musculature  region. Patient appeared to be with unremarkable Spurling's maneuver. Palpation over the region  of the thoracic facet thoracic paraspinal muscles region was with mild tends to palpation as well. Palpation over the acromioclavicular and glenohumeral joint regions reproduced mild discomfort. Patient was with decreased grip strength on the left compared to the right. Palpation over the lumbar paraspinal muscles lumbar facet region associated with moderate discomfort. Lateral bending and rotation and extension palpation lumbar facets reproduce moderate discomfort. Straight leg raising tolerates approximately 20 without increased pain noted with dorsiflexion noted. No definite sensory deficit of dermatomal distribution detected. There was negative clonus negative Homans. Greater trochanteric region iliotibial band region was without increased pain of significant degree. There appeared to be significant muscle spasms occurring in the lumbar paraspinal musculature region left as well as on the right as well as the gluteal and piriformis musculature region with limited straight leg raising. Homans negative Homans. Abdomen nontender with no costovertebral tenderness noted.        Assessment & Plan:    L4-L5 level degenerative changes noted on prior MRI  Lumbar stenosis with neurogenic claudication  Lumbar facet syndrome  Lumbar radiculopathy  Carpal tunnel syndrome  Tenosynovitis of the left upper extremity  Sacroiliac joint disease  Degenerative joint disease of hip     PLAN   Continue present medication Zanaflex and oxycodone  Lumbosacral selective nerve root block to be performed at time return appointment  F/U PCP  Dr. Brynda Greathouse for evaliation of  BP and general medical  condition  F/U surgical evaluation as discussed  F/U neurological evaluation. May consider [facility] EMG studies and other studies pending follow-up evaluation response to present treatment  May consider additional radiofrequency rhizolysis lumbar facet, medial branch nerves pending response to treatment and  follow-up evaluation  Patient to call Pain Management Center should patient have concerns prior to scheduled return appointment.      PLAN  Continue present medication Zanaflex and oxycodone  F/U PCP Dr. Brynda Greathouse for evaliation of  BP and general medical  condition. Follow-up Dr. Brynda Greathouse regarding fractures of the foot as well  F/U surgical evaluation. May consider pending follow-up evaluations. Patient will follow-up with Dr. Elvina Mattes for further evaluation and treatment of fracture of left foot   F/U neurological evaluation. May consider pending follow-up evaluations  May consider radiofrequency rhizolysis or intraspinal procedures pending response to present treatment and F/U evaluation   Patient to call Pain Management Center should patient have concerns prior to scheduled return appointment

## 2015-05-17 NOTE — Patient Instructions (Addendum)
  Continue present medication Zanaflex and oxycodone  F/U PCP Dr. Brynda Greathouse for evaliation of  BP and general medical  condition. Follow-up Dr. Brynda Greathouse regarding fractures of the foot as well  F/U surgical evaluation. May consider pending follow-up evaluations. Patient will follow-up with Dr. Elvina Mattes for further evaluation and treatment of fracture of left foot   F/U neurological evaluation. May consider pending follow-up evaluations  May consider radiofrequency rhizolysis or intraspinal procedures pending response to present treatment and F/U evaluation   Patient to call Pain Management Center should patient have concerns prior to scheduled return appointment

## 2015-06-14 ENCOUNTER — Ambulatory Visit: Payer: Medicare Other | Attending: Pain Medicine | Admitting: Pain Medicine

## 2015-06-14 ENCOUNTER — Encounter: Payer: Self-pay | Admitting: Pain Medicine

## 2015-06-14 VITALS — BP 120/97 | HR 80 | Temp 98.1°F | Resp 16 | Ht 61.0 in | Wt 168.0 lb

## 2015-06-14 DIAGNOSIS — M5481 Occipital neuralgia: Secondary | ICD-10-CM

## 2015-06-14 DIAGNOSIS — M4806 Spinal stenosis, lumbar region: Secondary | ICD-10-CM | POA: Insufficient documentation

## 2015-06-14 DIAGNOSIS — S92912G Unspecified fracture of left toe(s), subsequent encounter for fracture with delayed healing: Secondary | ICD-10-CM

## 2015-06-14 DIAGNOSIS — S92902A Unspecified fracture of left foot, initial encounter for closed fracture: Secondary | ICD-10-CM | POA: Diagnosis not present

## 2015-06-14 DIAGNOSIS — M47896 Other spondylosis, lumbar region: Secondary | ICD-10-CM | POA: Diagnosis not present

## 2015-06-14 DIAGNOSIS — M5136 Other intervertebral disc degeneration, lumbar region: Secondary | ICD-10-CM

## 2015-06-14 DIAGNOSIS — M47816 Spondylosis without myelopathy or radiculopathy, lumbar region: Secondary | ICD-10-CM

## 2015-06-14 DIAGNOSIS — M659 Synovitis and tenosynovitis, unspecified: Secondary | ICD-10-CM | POA: Insufficient documentation

## 2015-06-14 DIAGNOSIS — X58XXXA Exposure to other specified factors, initial encounter: Secondary | ICD-10-CM | POA: Diagnosis not present

## 2015-06-14 DIAGNOSIS — M4726 Other spondylosis with radiculopathy, lumbar region: Secondary | ICD-10-CM

## 2015-06-14 DIAGNOSIS — M51369 Other intervertebral disc degeneration, lumbar region without mention of lumbar back pain or lower extremity pain: Secondary | ICD-10-CM

## 2015-06-14 DIAGNOSIS — G5600 Carpal tunnel syndrome, unspecified upper limb: Secondary | ICD-10-CM | POA: Insufficient documentation

## 2015-06-14 DIAGNOSIS — M160 Bilateral primary osteoarthritis of hip: Secondary | ICD-10-CM

## 2015-06-14 DIAGNOSIS — M79605 Pain in left leg: Secondary | ICD-10-CM | POA: Diagnosis present

## 2015-06-14 DIAGNOSIS — M533 Sacrococcygeal disorders, not elsewhere classified: Secondary | ICD-10-CM | POA: Diagnosis not present

## 2015-06-14 DIAGNOSIS — G2581 Restless legs syndrome: Secondary | ICD-10-CM

## 2015-06-14 DIAGNOSIS — M545 Low back pain: Secondary | ICD-10-CM | POA: Diagnosis present

## 2015-06-14 DIAGNOSIS — M79604 Pain in right leg: Secondary | ICD-10-CM | POA: Diagnosis present

## 2015-06-14 DIAGNOSIS — G5602 Carpal tunnel syndrome, left upper limb: Secondary | ICD-10-CM

## 2015-06-14 DIAGNOSIS — M5416 Radiculopathy, lumbar region: Secondary | ICD-10-CM

## 2015-06-14 MED ORDER — OXYCODONE-ACETAMINOPHEN 5-325 MG PO TABS: ORAL_TABLET | ORAL | Status: DC

## 2015-06-14 MED ORDER — TIZANIDINE HCL 2 MG PO CAPS
ORAL_CAPSULE | ORAL | Status: DC
Start: 2015-06-14 — End: 2015-07-11

## 2015-06-14 NOTE — Patient Instructions (Addendum)
Continue present medication Zanaflex and oxycodone  Lumbar sympathetic block to be performed at time of return appointment  F/U PCP Dr. Brynda Greathouse for evaliation of  BP and general medical  condition. Follow-up Dr. Brynda Greathouse regarding fractures of the foot as well as with surgeon as discussed  F/U surgical evaluation. Patient will follow-up with Dr. Elvina Mattes for further evaluation and treatment of fracture of left foot as discussed today  F/U neurological evaluation. May consider pending follow-up evaluations  May consider radiofrequency rhizolysis or intraspinal procedures pending response to present treatment and F/U evaluation   Patient to call Pain Management Center should patient have concerns prior to scheduled return appointmentPain Management Discharge Instructions  General Discharge Instructions :  If you need to reach your doctor call: Monday-Friday 8:00 am - 4:00 pm at [phone] or toll free [phone].  After clinic hours [phone] to have operator reach doctor.  Bring all of your medication bottles to all your appointments in the pain clinic.  To cancel or reschedule your appointment with Pain Management please remember to call 24 hours in advance to avoid a fee.  Refer to the educational materials which you have been given on: General Risks, I had my Procedure. Discharge Instructions, Post Sedation.  Post Procedure Instructions:  The drugs you were given will stay in your system until tomorrow, so for the next 24 hours you should not drive, make any legal decisions or drink any alcoholic beverages.  You may eat anything you prefer, but it is better to start with liquids then soups and crackers, and gradually work up to solid foods.  Please notify your doctor immediately if you have any unusual bleeding, trouble breathing or pain that is not related to your normal pain.  Depending on the type of procedure that was done, some parts of your body may feel week and/or numb.   This usually clears up by tonight or the next day.  Walk with the use of an assistive device or accompanied by an adult for the 24 hours.  You may use ice on the affected area for the first 24 hours.  Put ice in a Ziploc bag and cover with a towel and place against area 15 minutes on 15 minutes off.  You may switch to heat after 24 hours.GENERAL RISKS AND COMPLICATIONS  What are the risk, side effects and possible complications? Generally speaking, most procedures are safe.  However, with any procedure there are risks, side effects, and the possibility of complications.  The risks and complications are dependent upon the sites that are lesioned, or the type of nerve block to be performed.  The closer the procedure is to the spine, the more serious the risks are.  Great care is taken when placing the radio frequency needles, block needles or lesioning probes, but sometimes complications can occur. 1. Infection: Any time there is an injection through the skin, there is a risk of infection.  This is why sterile conditions are used for these blocks.  There are four possible types of infection. 1. Localized skin infection. 2. Central Nervous System Infection-This can be in the form of Meningitis, which can be deadly. 3. Epidural Infections-This can be in the form of an epidural abscess, which can cause pressure inside of the spine, causing compression of the spinal cord with subsequent paralysis. This would require an emergency surgery to decompress, and there are no guarantees that the patient would recover from the paralysis. 4. Discitis-This is an infection of the intervertebral discs.  It occurs in about 1% of discography procedures.  It is difficult to treat and it may lead to surgery.        2. Pain: the needles have to go through skin and soft tissues, will cause soreness.       3. Damage to internal structures:  The nerves to be lesioned may be near blood vessels or    other nerves which can be  potentially damaged.       4. Bleeding: Bleeding is more common if the patient is taking blood thinners such as  aspirin, Coumadin, Ticiid, Plavix, etc., or if he/she have some genetic predisposition  such as hemophilia. Bleeding into the spinal canal can cause compression of the spinal  cord with subsequent paralysis.  This would require an emergency surgery to  decompress and there are no guarantees that the patient would recover from the  paralysis.       5. Pneumothorax:  Puncturing of a lung is a possibility, every time a needle is introduced in  the area of the chest or upper back.  Pneumothorax refers to free air around the  collapsed lung(s), inside of the thoracic cavity (chest cavity).  Another two possible  complications related to a similar event would include: Hemothorax and Chylothorax.   These are variations of the Pneumothorax, where instead of air around the collapsed  lung(s), you may have blood or chyle, respectively.       6. Spinal headaches: They may occur with any procedures in the area of the spine.       7. Persistent CSF (Cerebro-Spinal Fluid) leakage: This is a rare problem, but may occur  with prolonged intrathecal or epidural catheters either due to the formation of a fistulous  track or a dural tear.       8. Nerve damage: By working so close to the spinal cord, there is always a possibility of  nerve damage, which could be as serious as a permanent spinal cord injury with  paralysis.       9. Death:  Although rare, severe deadly allergic reactions known as "Anaphylactic  reaction" can occur to any of the medications used.      10. Worsening of the symptoms:  We can always make thing worse.  What are the chances of something like this happening? Chances of any of this occuring are extremely low.  By statistics, you have more of a chance of getting killed in a motor vehicle accident: while driving to the hospital than any of the above occurring .  Nevertheless, you should be  aware that they are possibilities.  In general, it is similar to taking a shower.  Everybody knows that you can slip, hit your head and get killed.  Does that mean that you should not shower again?  Nevertheless always keep in mind that statistics do not mean anything if you happen to be on the wrong side of them.  Even if a procedure has a 1 (one) in a 1,000,000 (million) chance of going wrong, it you happen to be that one..Also, keep in mind that by statistics, you have more of a chance of having something go wrong when taking medications.  Who should not have this procedure? If you are on a blood thinning medication (e.g. Coumadin, Plavix, see list of "Blood Thinners"), or if you have an active infection going on, you should not have the procedure.  If you are taking any blood thinners, please inform your physician.  How should I prepare for this procedure?  Do not eat or drink anything at least six hours prior to the procedure.  Bring a driver with you .  It cannot be a taxi.  Come accompanied by an adult that can drive you back, and that is strong enough to help you if your legs get weak or numb from the local anesthetic.  Take all of your medicines the morning of the procedure with just enough water to swallow them.  If you have diabetes, make sure that you are scheduled to have your procedure done first thing in the morning, whenever possible.  If you have diabetes, take only half of your insulin dose and notify our nurse that you have done so as soon as you arrive at the clinic.  If you are diabetic, but only take blood sugar pills (oral hypoglycemic), then do not take them on the morning of your procedure.  You may take them after you have had the procedure.  Do not take aspirin or any aspirin-containing medications, at least eleven (11) days prior to the procedure.  They may prolong bleeding.  Wear loose fitting clothing that may be easy to take off and that you would not mind if it  got stained with Betadine or blood.  Do not wear any jewelry or perfume  Remove any nail coloring.  It will interfere with some of our monitoring equipment.  NOTE: Remember that this is not meant to be interpreted as a complete list of all possible complications.  Unforeseen problems may occur.  BLOOD THINNERS The following drugs contain aspirin or other products, which can cause increased bleeding during surgery and should not be taken for 2 weeks prior to and 1 week after surgery.  If you should need take something for relief of minor pain, you may take acetaminophen which is found in Tylenol,m Datril, Anacin-3 and Panadol. It is not blood thinner. The products listed below are.  Do not take any of the products listed below in addition to any listed on your instruction sheet.  A.P.C or A.P.C with Codeine Codeine Phosphate Capsules #3 Ibuprofen Ridaura  ABC compound Congesprin Imuran rimadil  Advil Cope Indocin Robaxisal  Alka-Seltzer Effervescent Pain Reliever and Antacid Coricidin or Coricidin-D  Indomethacin Rufen  Alka-Seltzer plus Cold Medicine Cosprin Ketoprofen S-A-C Tablets  Anacin Analgesic Tablets or Capsules Coumadin Korlgesic Salflex  Anacin Extra Strength Analgesic tablets or capsules CP-2 Tablets Lanoril Salicylate  Anaprox Cuprimine Capsules Levenox Salocol  Anexsia-D Dalteparin Magan Salsalate  Anodynos Darvon compound Magnesium Salicylate Sine-off  Ansaid Dasin Capsules Magsal Sodium Salicylate  Anturane Depen Capsules Marnal Soma  APF Arthritis pain formula Dewitt's Pills Measurin Stanback  Argesic Dia-Gesic Meclofenamic Sulfinpyrazone  Arthritis Bayer Timed Release Aspirin Diclofenac Meclomen Sulindac  Arthritis pain formula Anacin Dicumarol Medipren Supac  Analgesic (Safety coated) Arthralgen Diffunasal Mefanamic Suprofen  Arthritis Strength Bufferin Dihydrocodeine Mepro Compound Suprol  Arthropan liquid Dopirydamole Methcarbomol with Aspirin Synalgos  ASA  tablets/Enseals Disalcid Micrainin Tagament  Ascriptin Doan's Midol Talwin  Ascriptin A/D Dolene Mobidin Tanderil  Ascriptin Extra Strength Dolobid Moblgesic Ticlid  Ascriptin with Codeine Doloprin or Doloprin with Codeine Momentum Tolectin  Asperbuf Duoprin Mono-gesic Trendar  Aspergum Duradyne Motrin or Motrin IB Triminicin  Aspirin plain, buffered or enteric coated Durasal Myochrisine Trigesic  Aspirin Suppositories Easprin Nalfon Trillsate  Aspirin with Codeine Ecotrin Regular or Extra Strength Naprosyn Uracel  Atromid-S Efficin Naproxen Ursinus  Auranofin Capsules Elmiron Neocylate Vanquish  Axotal Emagrin Norgesic Verin  Azathioprine  Empirin or Empirin with Codeine Normiflo Vitamin E  Azolid Emprazil Nuprin Voltaren  Bayer Aspirin plain, buffered or children's or timed BC Tablets or powders Encaprin Orgaran Warfarin Sodium  Buff-a-Comp Enoxaparin Orudis Zorpin  Buff-a-Comp with Codeine Equegesic Os-Cal-Gesic   Buffaprin Excedrin plain, buffered or Extra Strength Oxalid   Bufferin Arthritis Strength Feldene Oxphenbutazone   Bufferin plain or Extra Strength Feldene Capsules Oxycodone with Aspirin   Bufferin with Codeine Fenoprofen Fenoprofen Pabalate or Pabalate-SF   Buffets II Flogesic Panagesic   Buffinol plain or Extra Strength Florinal or Florinal with Codeine Panwarfarin   Buf-Tabs Flurbiprofen Penicillamine   Butalbital Compound Four-way cold tablets Penicillin   Butazolidin Fragmin Pepto-Bismol   Carbenicillin Geminisyn Percodan   Carna Arthritis Reliever Geopen Persantine   Carprofen Gold's salt Persistin   Chloramphenicol Goody's Phenylbutazone   Chloromycetin Haltrain Piroxlcam   Clmetidine heparin Plaquenil   Cllnoril Hyco-pap Ponstel   Clofibrate Hydroxy chloroquine Propoxyphen         Before stopping any of these medications, be sure to consult the physician who ordered them.  Some, such as Coumadin (Warfarin) are ordered to prevent or treat serious conditions  such as "deep thrombosis", "pumonary embolisms", and other heart problems.  The amount of time that you may need off of the medication may also vary with the medication and the reason for which you were taking it.  If you are taking any of these medications, please make sure you notify your pain physician before you undergo any procedures.         Lumbar Sympathetic Block Patient Information  Description: The lumbar plexus is a group of nerves that are part of the sympathetic nervous system.  These nerves supply organs in the pelvis and legs.  Lumbar sympathetic blocks are utilized for the diagnosis and treatment of painful conditions in these areas.   The lumbar plexus is located on both sides of the aorta at approximately the level of the second lumbar vertebral body.  The block will be performed with you lying on your abdomen with a pillow underneath.  Using direct x-ray guidance,   The plexus will be located on both sides of the spine.  Numbing medicine will be used to deaden the skin prior to needle insertion.  In most cases, a small amount of sedation can be give by IV prior to the numbing medicine.  One or two small needles will be placed near the plexus and local anesthetic will be injected.  This may make your leg(s) feel warm.  The Entire block usually lasts about 15-25 minutes.  Conditions which may be treated by lumbar sympathetic block:   Reflex sympathetic dystrophy  Phantom limb pain  Peripheral neuropathy  Peripheral vascular disease ( inadequate blood flow )  Cancer pain of pelvis, leg and kidney  Preparation for the injection:  1. Do note eat any solid food or diary products within 6 hours of your appointment. 2. You may drink clear liquids up to 2 hours before appointment.  Clear liquids include water, black coffee, juice or soda.  No milk or cream please. 3. You may take your regular medication, including pain medications, with a sip of water before you  appointment.  Diabetics should hold regular insulin ( if taken separately ) and take 1/2 NPH dose the morning of the procedure .  Carry some sugar containing items with you to your appointment. 4. A driver must accompany you and be prepared to drive you home after your procedure. 5.  Bring all your current medication with you. 6. An IV may be inserted and sedation may be given at the discretion of the physician.  7. A blood pressure cuff, EKG and other monitors will often be applied during the procedure.  Some patients may need to have extra oxygen administered for a short period. 8. You will be asked to provide medical information, including your allergies and medications, prior to the procedure.  We must know immediately if your taking blood thinners (like Coumadin/Warfarin) or if you are allergic to IV iodine contrast (dye).  We must know if you could possibly be pregnant.  Possible side-effects   Bleeding from needle site or deeper  Infection (rare, can require surgery)  Nerve injury (rare)  Numbness & tingling (temporary)  Collapsed lung (rare)  Spinal headache (a headache worse with upright posture)  Light-headedness (temporary)  Pain at injection site (several days)  Decreased blood pressure (temporary)  Weakness in legs (temporary)  Seizure or other drug reaction (rare)  Call if you experience:   Fever/chills associated with headache or increased back/ neck pain  Headache worsened by an upright position  New onset weakness or numbness of an extremity below the injection site  Hives or difficulty breathing ( go to the emergency room)  Inflammation or drainage at the injections site(s)  New symptoms which are concerning to you  Please note:  If effective, we will often do a series of 2-3 injections spaced 3-6 weeks apart to maximally decrease your pain.  If initial series is effective, you may be a candidate for a more permanent block of the lumbar sympathetic  plexus.  If you have any questions please call [phone] [facility]

## 2015-06-14 NOTE — Progress Notes (Signed)
Safety precautions to be maintained throughout the outpatient stay will include: orient to surroundings, keep bed in low position, maintain call bell within reach at all times, provide assistance with transfer out of bed and ambulation.  

## 2015-06-14 NOTE — Progress Notes (Signed)
Subjective:    Patient ID: Janet Murray, female    DOB: 1970/07/18, 45 y.o.   MRN: [id number]  HPI The patient is a 45 year old female who returns to pain management Center for further evaluation and treatment of pain involving the lower back and lower extremity region predominantly and especially the left lower extremity status post fracture of the left foot. The patient is scheduled to undergo follow-up evaluation with Dr. Elvina Mattes. The patient states that the pain is quite severe of the left lower extremity with burning stinging throbbing sensations of the left lower extremity. We discussed patient's condition and will consider patient for lumbar sympathetic block to be performed at time return appointment in attempt to promote healing of the lower extremity and decreased the paresthesias of the lower extremities which are severely disabling for the patient. In attempt to promote healing as well as prevent deterioration of patient's condition and to avoid the need for more involved treatment will proceed with lumbar synthetic block at time return appointment as discussed and explained to patient. The patient denies any other trauma change in events of daily living the cost change in symptomatology. The patient will continue present medications including tizanidine and and oxycodone as discussed and we will consider additional modifications of patient treatment regimen pending response to treatment and follow-up evaluation as discussed. The patient agreed to suggested treatment plan.      Review of Systems     Objective:   Physical Exam there was tends to palpation of the splenius capitis and occipitalis musculatures and a mild degree there was mild tenderness over the cervical facet cervical paraspinal musculature region. There was mild tenderness to palpation of the acromioclavicular and glenohumeral joint region. Patient appeared to be with decreased grip strength on the left compared to the  right  and Tinel and Phalen's maneuver were with mild to moderate increased pain . Palpation over the region of the thoracic facet thoracic paraspinal musculature region was attends to palpation of mild degree with no crepitus of the thoracic region noted. Palpation over the lumbar paraspinal must reason lumbar facet region was attends to palpation of moderate degree. Lateral bending rotation extension and palpation of the lumbar facets reproduce moderate discomfort. There was mild to moderate tenderness of the PSIS and PII S region as well as the gluteal and piriformis muscles region. The left lower extremity was with tenderness to palpation with decreased EHL strength. There were areas of increased sensitivity to touch of the left foot. There was some areas suggestive of allodynia of the left foot There was negative Homans. There was swelling of the left lower extremity compared to the right lower extremity with tenderness to palpation of the left lower extremity noted there was tenderness to palpation over the greater trochanteric region iliotibial band region a mild degree. Abdomen was nontender with no costovertebral tenderness noted.      Assessment & Plan:   Fracture of left foot (concern regarding changes suggestive of early complex regional pain syndrome)  L4-L5 level degenerative changes noted on prior MRI  Lumbar stenosis with neurogenic claudication  Lumbar facet syndrome  Lumbar radiculopathy  Carpal tunnel syndrome  Tenosynovitis of the left upper extremity  Sacroiliac joint dysfunction     PLAN  Continue present medication Zanaflex and oxycodone  Lumbar sympathetic block to be performed at time of return appointment  F/U PCP Dr. Brynda Greathouse for evaliation of  BP and general medical  condition. Follow-up Dr. Brynda Greathouse regarding fractures of the foot  as well as with surgeon as discussed  F/U surgical evaluation. Patient will follow-up with Dr. Elvina Mattes for further evaluation and  treatment of fracture of left foot as discussed today  F/U neurological evaluation. May consider pending follow-up evaluations  May consider radiofrequency rhizolysis or intraspinal procedures pending response to present treatment and F/U evaluation   Patient to call Pain Management Center should patient have concerns prior to scheduled return appointment

## 2015-06-25 ENCOUNTER — Encounter: Payer: Self-pay | Admitting: Pain Medicine

## 2015-06-25 ENCOUNTER — Ambulatory Visit: Payer: Medicare Other | Attending: Pain Medicine | Admitting: Pain Medicine

## 2015-06-25 VITALS — BP 98/77 | HR 72 | Temp 93.9°F | Resp 18 | Wt 168.0 lb

## 2015-06-25 DIAGNOSIS — S92912G Unspecified fracture of left toe(s), subsequent encounter for fracture with delayed healing: Secondary | ICD-10-CM

## 2015-06-25 DIAGNOSIS — X58XXXA Exposure to other specified factors, initial encounter: Secondary | ICD-10-CM | POA: Diagnosis not present

## 2015-06-25 DIAGNOSIS — G609 Hereditary and idiopathic neuropathy, unspecified: Secondary | ICD-10-CM

## 2015-06-25 DIAGNOSIS — M160 Bilateral primary osteoarthritis of hip: Secondary | ICD-10-CM

## 2015-06-25 DIAGNOSIS — M79604 Pain in right leg: Secondary | ICD-10-CM | POA: Insufficient documentation

## 2015-06-25 DIAGNOSIS — S92902A Unspecified fracture of left foot, initial encounter for closed fracture: Secondary | ICD-10-CM | POA: Diagnosis not present

## 2015-06-25 DIAGNOSIS — M5136 Other intervertebral disc degeneration, lumbar region: Secondary | ICD-10-CM

## 2015-06-25 DIAGNOSIS — M4726 Other spondylosis with radiculopathy, lumbar region: Secondary | ICD-10-CM

## 2015-06-25 DIAGNOSIS — G2581 Restless legs syndrome: Secondary | ICD-10-CM

## 2015-06-25 DIAGNOSIS — G5602 Carpal tunnel syndrome, left upper limb: Secondary | ICD-10-CM

## 2015-06-25 DIAGNOSIS — M533 Sacrococcygeal disorders, not elsewhere classified: Secondary | ICD-10-CM

## 2015-06-25 DIAGNOSIS — M79605 Pain in left leg: Secondary | ICD-10-CM | POA: Diagnosis present

## 2015-06-25 DIAGNOSIS — M5481 Occipital neuralgia: Secondary | ICD-10-CM

## 2015-06-25 DIAGNOSIS — M5416 Radiculopathy, lumbar region: Secondary | ICD-10-CM

## 2015-06-25 DIAGNOSIS — M47816 Spondylosis without myelopathy or radiculopathy, lumbar region: Secondary | ICD-10-CM

## 2015-06-25 MED ORDER — TRIAMCINOLONE ACETONIDE 40 MG/ML IJ SUSP
INTRAMUSCULAR | Status: AC
  Filled 2015-06-25: qty 1

## 2015-06-25 MED ORDER — LIDOCAINE HCL (PF) 1 % IJ SOLN: 10.0000 mL | Freq: Once | INTRAMUSCULAR | Status: DC

## 2015-06-25 MED ORDER — ORPHENADRINE CITRATE 30 MG/ML IJ SOLN
INTRAMUSCULAR | Status: AC
  Filled 2015-06-25: qty 2

## 2015-06-25 MED ORDER — FENTANYL CITRATE (PF) 100 MCG/2ML IJ SOLN
INTRAMUSCULAR | Status: AC
  Administered 2015-06-25: 100 ug via INTRAVENOUS
  Filled 2015-06-25: qty 2

## 2015-06-25 MED ORDER — FENTANYL CITRATE (PF) 100 MCG/2ML IJ SOLN: 100.0000 ug | Freq: Once | INTRAMUSCULAR | Status: DC

## 2015-06-25 MED ORDER — MIDAZOLAM HCL 5 MG/5ML IJ SOLN
INTRAMUSCULAR | Status: AC
  Administered 2015-06-25: 5 mg via INTRAVENOUS
  Filled 2015-06-25: qty 5

## 2015-06-25 MED ORDER — BUPIVACAINE HCL (PF) 0.25 % IJ SOLN
INTRAMUSCULAR | Status: AC
  Administered 2015-06-25: 12:00:00
  Filled 2015-06-25: qty 30

## 2015-06-25 MED ORDER — ORPHENADRINE CITRATE 30 MG/ML IJ SOLN: 60.0000 mg | Freq: Once | INTRAMUSCULAR | Status: DC

## 2015-06-25 MED ORDER — BUPIVACAINE HCL (PF) 0.25 % IJ SOLN: 30.0000 mL | Freq: Once | INTRAMUSCULAR | Status: DC

## 2015-06-25 MED ORDER — TRIAMCINOLONE ACETONIDE 40 MG/ML IJ SUSP
40.0000 mg | Freq: Once | INTRAMUSCULAR | Status: DC
Start: 2015-06-25 — End: 2017-08-26

## 2015-06-25 MED ORDER — MIDAZOLAM HCL 5 MG/5ML IJ SOLN: 5.0000 mg | Freq: Once | INTRAMUSCULAR | Status: DC

## 2015-06-25 MED ORDER — LACTATED RINGERS IV SOLN: 1000.0000 mL | INTRAVENOUS | Status: DC

## 2015-06-25 NOTE — Progress Notes (Signed)
Safety precautions to be maintained throughout the outpatient stay will include: orient to surroundings, keep bed in low position, maintain call bell within reach at all times, provide assistance with transfer out of bed and ambulation.  

## 2015-06-25 NOTE — Patient Instructions (Addendum)
PLAN   Continue present medication Zanaflex and oxycodone  F/U PCP Dr. Brynda Greathouse for evaliation of  BP and general medical  condition  F/U surgical evaluation. Follow-up with Dr. Elvina Mattes as planned for fracture of foot  F/U neurological evaluation. May consider pending follow-up evaluations  May consider radiofrequency rhizolysis or intraspinal procedures pending response to present treatment and F/U evaluation   Patient to call [facility] Center should patient have concerns prior to scheduled return appointment.Lumbar Sympathetic Block Patient Information  Description: The lumbar plexus is a group of nerves that are part of the sympathetic nervous system.  These nerves supply organs in the pelvis and legs.  Lumbar sympathetic blocks are utilized for the diagnosis and treatment of painful conditions in these areas.   The lumbar plexus is located on both sides of the aorta at approximately the level of the second lumbar vertebral body.  The block will be performed with you lying on your abdomen with a pillow underneath.  Using direct x-ray guidance,   The plexus will be located on both sides of the spine.  Numbing medicine will be used to deaden the skin prior to needle insertion.  In most cases, a small amount of sedation can be give by IV prior to the numbing medicine.  One or two small needles will be placed near the plexus and local anesthetic will be injected.  This may make your leg(s) feel warm.  The Entire block usually lasts about 15-25 minutes.  Conditions which may be treated by lumbar sympathetic block:   Reflex sympathetic dystrophy  Phantom limb pain  Peripheral neuropathy  Peripheral vascular disease ( inadequate blood flow )  Cancer pain of pelvis, leg and kidney  Preparation for the injection:  1. Do note eat any solid food or diary products within 6 hours of your appointment. 2. You may drink clear liquids up to 2 hours before appointment.  Clear liquids include  water, black coffee, juice or soda.  No milk or cream please. 3. You may take your regular medication, including pain medications, with a sip of water before you appointment.  Diabetics should hold regular insulin ( if taken separately ) and take 1/2 NPH dose the morning of the procedure .  Carry some sugar containing items with you to your appointment. 4. A driver must accompany you and be prepared to drive you home after your procedure. 5. Bring all your current medication with you. 6. An IV may be inserted and sedation may be given at the discretion of the physician.  7. A blood pressure cuff, EKG and other monitors will often be applied during the procedure.  Some patients may need to have extra oxygen administered for a short period. 8. You will be asked to provide medical information, including your allergies and medications, prior to the procedure.  We must know immediately if your taking blood thinners (like Coumadin/Warfarin) or if you are allergic to IV iodine contrast (dye).  We must know if you could possibly be pregnant.  Possible side-effects   Bleeding from needle site or deeper  Infection (rare, can require surgery)  Nerve injury (rare)  Numbness & tingling (temporary)  Collapsed lung (rare)  Spinal headache (a headache worse with upright posture)  Light-headedness (temporary)  Pain at injection site (several days)  Decreased blood pressure (temporary)  Weakness in legs (temporary)  Seizure or other drug reaction (rare)  Call if you experience:   Fever/chills associated with headache or increased back/ neck pain  Headache worsened by an upright position  New onset weakness or numbness of an extremity below the injection site  Hives or difficulty breathing ( go to the emergency room)  Inflammation or drainage at the injections site(s)  New symptoms which are concerning to you  Please note:  If effective, we will often do a series of 2-3 injections  spaced 3-6 weeks apart to maximally decrease your pain.  If initial series is effective, you may be a candidate for a more permanent block of the lumbar sympathetic plexus.  If you have any questions please call [phone] [facility] Pain Management Discharge Instructions  General Discharge Instructions :  If you need to reach your doctor call: Monday-Friday 8:00 am - 4:00 pm at [phone] or toll free [phone].  After clinic hours [phone] to have operator reach doctor.  Bring all of your medication bottles to all your appointments in the pain clinic.  To cancel or reschedule your appointment with Pain Management please remember to call 24 hours in advance to avoid a fee.  Refer to the educational materials which you have been given on: General Risks, I had my Procedure. Discharge Instructions, Post Sedation.  Post Procedure Instructions:  The drugs you were given will stay in your system until tomorrow, so for the next 24 hours you should not drive, make any legal decisions or drink any alcoholic beverages.  You may eat anything you prefer, but it is better to start with liquids then soups and crackers, and gradually work up to solid foods.  Please notify your doctor immediately if you have any unusual bleeding, trouble breathing or pain that is not related to your normal pain.  Depending on the type of procedure that was done, some parts of your body may feel week and/or numb.  This usually clears up by tonight or the next day.  Walk with the use of an assistive device or accompanied by an adult for the 24 hours.  You may use ice on the affected area for the first 24 hours.  Put ice in a Ziploc bag and cover with a towel and place against area 15 minutes on 15 minutes off.  You may switch to heat after 24 hours.

## 2015-06-25 NOTE — Progress Notes (Signed)
Subjective:    Patient ID: Janet Murray, female    DOB: July 31, 1970, 45 y.o.   MRN: [id number]  HPI  PROCEDURE PERFORMED: Lumbar sympathetic block.  HISTORY OF PRESENT ILLNESS: The patient is 45 y.o. female who returns to [facility] for further evaluation and treatment of pain involving the lower extremities. The patient is with history of fracture of the left foot with pain described as sharp shooting throbbing pain with extreme sensitivity to touch of the left foot . There is concern regarding the patient's pain being due to fracture of the left foot as well as concern regarding component of complex regional pain syndrome we will proceed with lumbar sympathetic block in attempt to promote healing of fracture . We will proceed with lumbar sympathetic block in attempt to promote healing of the fracture into minimize patient's chance of developing further symptoms of complex regional pain syndrome The risks, benefits, and expectations of the procedure were discussed and explained to the patient who was understanding and wished to proceed with interventional treatment as planned.   DESCRIPTION OF PROCEDURE: Lumbar sympathetic block with IV Versed, IV fentanyl conscious sedation, EKG, blood pressure, pulse, pulse oximetry monitoring. The procedure was performed with the patient in prone position under fluoroscopic guidance.   NEEDLE PLACEMENT AT L2, left side lumbar sympathetic block: With the patient in prone position and oblique orientation of 20 degrees, Betadine prep and local anesthetic skin wheal of 1.5% lidocaine plain was prepared at the proposed needle entry site. Under fluoroscopic guidance with 20 degrees oblique orientation, the 22 -gauge needle was inserted at the lateral border of the L2 vertebral body on the left side.   NEEDLE PLACEMENT AT L3, left side lumbar sympathetic block: With the patient in the prone position and oblique orientation of 20 degrees, Betadine prep and  local anesthetic skin wheal of 1.5% lidocaine plain was prepared at the proposed needle entry site. Under fluoroscopic guidance with 20 degrees  oblique orientation, the 22 -gauge needle was inserted at the lateral border of the L3 vertebral body on the left side.   NEEDLE PLACEMENT AT L4, left side lumbar sympathetic block: With the patient in prone position and oblique orientation of 20 degrees, Betadine prep and local anesthetic skin wheal of 1.5% lidocaine plain was prepared at the proposed needle entry site. Under fluoroscopic guidance with 20 degrees  oblique orientation, the 22 -gauge needle was inserted at the lateral border of the L4 vertebral body on the left side.    Following needle placement at the L2, L3 and L4 vertebral body levels on the left side, needle placement was then verified on lateral view with tip of the needle documented to be in the anterior third of the vertebral body of L2, L3 and L4 respectively. Following negative aspiration of each needle for heme and CSF, L2 vertebral body level needle was injected with 10 mL of 0.25% bupivacaine. L3 vertebral body level needle was injected with 10 mL of 0.25% bupivacaine. L4 vertebral body level was injected with 10 mL of 0.25% bupivacaine. Needles were removed. Please note temperature readings prior to lumbar sympathetic block were noted to be 92.4 degrees Fahrenheit and following completion of the lumbar sympathetic block temperature readings of the lower extremity were noted to be 93.9 degrees Fahrenheit. The patient tolerated the procedure well.  PLAN:   1. Medications: We will continue presently prescribed medications Zanaflex and oxycodone at this time. 2. The patient is to follow-up with primary care physician  Dr. Brynda Greathouse for further evaluation of blood pressure and general medical condition as discussed. 3. Surgical evaluation as discussed. The patient will follow-up with Dr. Elvina Mattes 4. Neurological evaluation as discussed. 5. The  patient may be candidate for radiofrequency procedures, implantation devices, and other treatment pending response to treatment and follow-up evaluation. 6. The patient has been advised to adhere to proper body mechanics. 7. The patient has been advised to call the Pain Management Center prior to scheduled return appointment should there be significant change in condition or have other concerns regarding condition prior to scheduled return appointment.   The patient was understanding and in agreement with suggested treatment plan.   Review of Systems     Objective:   Physical Exam        Assessment & Plan:

## 2015-06-26 ENCOUNTER — Telehealth: Payer: Self-pay | Admitting: *Deleted

## 2015-06-26 NOTE — Telephone Encounter (Signed)
Left message

## 2015-07-11 ENCOUNTER — Ambulatory Visit: Payer: Medicare Other | Attending: Pain Medicine | Admitting: Pain Medicine

## 2015-07-11 ENCOUNTER — Encounter: Payer: Self-pay | Admitting: Pain Medicine

## 2015-07-11 VITALS — BP 118/87 | HR 79 | Temp 98.0°F | Resp 18 | Ht 61.0 in | Wt 173.0 lb

## 2015-07-11 DIAGNOSIS — M5136 Other intervertebral disc degeneration, lumbar region: Secondary | ICD-10-CM

## 2015-07-11 DIAGNOSIS — G2581 Restless legs syndrome: Secondary | ICD-10-CM

## 2015-07-11 DIAGNOSIS — M4726 Other spondylosis with radiculopathy, lumbar region: Secondary | ICD-10-CM

## 2015-07-11 DIAGNOSIS — M545 Low back pain: Secondary | ICD-10-CM | POA: Diagnosis present

## 2015-07-11 DIAGNOSIS — X58XXXA Exposure to other specified factors, initial encounter: Secondary | ICD-10-CM | POA: Diagnosis not present

## 2015-07-11 DIAGNOSIS — S92902A Unspecified fracture of left foot, initial encounter for closed fracture: Secondary | ICD-10-CM | POA: Insufficient documentation

## 2015-07-11 DIAGNOSIS — M47896 Other spondylosis, lumbar region: Secondary | ICD-10-CM | POA: Insufficient documentation

## 2015-07-11 DIAGNOSIS — M160 Bilateral primary osteoarthritis of hip: Secondary | ICD-10-CM

## 2015-07-11 DIAGNOSIS — M79606 Pain in leg, unspecified: Secondary | ICD-10-CM | POA: Diagnosis present

## 2015-07-11 DIAGNOSIS — G5602 Carpal tunnel syndrome, left upper limb: Secondary | ICD-10-CM

## 2015-07-11 DIAGNOSIS — M659 Synovitis and tenosynovitis, unspecified: Secondary | ICD-10-CM | POA: Insufficient documentation

## 2015-07-11 DIAGNOSIS — M4806 Spinal stenosis, lumbar region: Secondary | ICD-10-CM | POA: Diagnosis not present

## 2015-07-11 DIAGNOSIS — G5600 Carpal tunnel syndrome, unspecified upper limb: Secondary | ICD-10-CM | POA: Insufficient documentation

## 2015-07-11 DIAGNOSIS — S92912G Unspecified fracture of left toe(s), subsequent encounter for fracture with delayed healing: Secondary | ICD-10-CM

## 2015-07-11 DIAGNOSIS — M5481 Occipital neuralgia: Secondary | ICD-10-CM

## 2015-07-11 DIAGNOSIS — M47816 Spondylosis without myelopathy or radiculopathy, lumbar region: Secondary | ICD-10-CM

## 2015-07-11 DIAGNOSIS — M533 Sacrococcygeal disorders, not elsewhere classified: Secondary | ICD-10-CM

## 2015-07-11 DIAGNOSIS — M5416 Radiculopathy, lumbar region: Secondary | ICD-10-CM

## 2015-07-11 MED ORDER — OXYCODONE-ACETAMINOPHEN 5-325 MG PO TABS: ORAL_TABLET | ORAL | Status: DC

## 2015-07-11 MED ORDER — TIZANIDINE HCL 2 MG PO CAPS: ORAL_CAPSULE | ORAL | Status: DC

## 2015-07-11 NOTE — Progress Notes (Signed)
   Subjective:    Patient ID: Janet Murray, female    DOB: 01/11/1971, 45 y.o.   MRN: [id number]  HPI  The patient is a 45 year old female who returns to pain management for further evaluation and treatment of pain involving the lower back and lower extremity region with fracture of the foot of the left lower extremity. The patient has undergone lumbar sympathetic block in pain management and is with significant improvement of her condition of the left lower extremity with evidence of healing. We will schedule patient for another lumbar sympathetic block in attempt to improve the healing process more expeditiously. The patient has noted less pain of the lower extremity and states that surgery for fracture of the foot has been counseled since the patient is with evidence of healing at this time. We will proceed with another lumbar sympathetic block in attempt to improve healing process, prevent deterioration of patient's condition, and avoid the need for more involved treatment. The patient was understanding and in agreement suggested treatment plan. The patient will continue Zanaflex and oxycodone as well as other medications as prescribed. The patient was in agreement with suggested treatment plan the patient will follow-up with Dr. Elvina Mattes as planned for further evaluation of fact of the left foot      Review of Systems     Objective:   Physical Exam  There was tenderness of the splenius capitis and a separate talus musculature regions. Palpation of these regions reproduced pain of minimal degree. There was minimal tenderness over the acromioclavicular and glenohumeral joint region and patient appeared to be with unremarkable Spurling's maneuver. Palpation over the cervical facet cervical paraspinal musculature region as well as the thoracic facet and thoracic paraspinal must reason were with minimal discomfort and minimal muscle spasms. Palpation over the lumbar paraspinal must reason lumbar  facet region was attends to palpation of mild to moderate degree with lateral bending rotation extension and palpation over the lumbar facets reproducing minimal discomfort. There was mild tenderness of the PSIS and PII S region as well as the gluteal and piriformis musculature region with mild tenderness to palpation of the greater trochanteric region and iliotibial band region. There was decreased EHL strength with negative Homans. The left foot was with good capillary refill and was without evidence of allodynia. There were areas with increased sensitivity to touch. Abdomen was nontender with no costovertebral tenderness noted.      Assessment & Plan:    Fracture of left foot (concern regarding changes suggestive of early complex regional pain syndrome)  L4-L5 level degenerative changes noted on prior MRI  Lumbar stenosis with neurogenic claudication  Lumbar facet syndrome  Lumbar radiculopathy  Carpal tunnel syndrome  Tenosynovitis of the left upper extremity

## 2015-07-11 NOTE — Progress Notes (Signed)
Safety precautions to be maintained throughout the outpatient stay will include: orient to surroundings, keep bed in low position, maintain call bell within reach at all times, provide assistance with transfer out of bed and ambulation.  

## 2015-07-11 NOTE — Patient Instructions (Addendum)
Continue present medication Zanaflex and oxycodone  Lumbar sympathetic block to be performed at time of return appointment  F/U PCP Dr. Brynda Greathouse for evaliation of  BP and general medical  condition. Follow-up Dr. Brynda Greathouse regarding fractures of the foot as well as with surgeon as discussed  F/U surgical evaluation. Patient will follow-up with Dr. Elvina Mattes as discussed  F/U neurological evaluation. May consider pending follow-up evaluations  May consider radiofrequency rhizolysis or intraspinal procedures pending response to present treatment and F/U evaluation   Patient to call [facility] should patient have concerns prior to scheduled return appointmentGENERAL RISKS AND COMPLICATIONS  What are the risk, side effects and possible complications? Generally speaking, most procedures are safe.  However, with any procedure there are risks, side effects, and the possibility of complications.  The risks and complications are dependent upon the sites that are lesioned, or the type of nerve block to be performed.  The closer the procedure is to the spine, the more serious the risks are.  Great care is taken when placing the radio frequency needles, block needles or lesioning probes, but sometimes complications can occur. 1. Infection: Any time there is an injection through the skin, there is a risk of infection.  This is why sterile conditions are used for these blocks.  There are four possible types of infection. 1. Localized skin infection. 2. Central Nervous System Infection-This can be in the form of Meningitis, which can be deadly. 3. Epidural Infections-This can be in the form of an epidural abscess, which can cause pressure inside of the spine, causing compression of the spinal cord with subsequent paralysis. This would require an emergency surgery to decompress, and there are no guarantees that the patient would recover from the paralysis. 4. Discitis-This is an infection of the  intervertebral discs.  It occurs in about 1% of discography procedures.  It is difficult to treat and it may lead to surgery.        2. Pain: the needles have to go through skin and soft tissues, will cause soreness.       3. Damage to internal structures:  The nerves to be lesioned may be near blood vessels or    other nerves which can be potentially damaged.       4. Bleeding: Bleeding is more common if the patient is taking blood thinners such as  aspirin, Coumadin, Ticiid, Plavix, etc., or if he/she have some genetic predisposition  such as hemophilia. Bleeding into the spinal canal can cause compression of the spinal  cord with subsequent paralysis.  This would require an emergency surgery to  decompress and there are no guarantees that the patient would recover from the  paralysis.       5. Pneumothorax:  Puncturing of a lung is a possibility, every time a needle is introduced in  the area of the chest or upper back.  Pneumothorax refers to free air around the  collapsed lung(s), inside of the thoracic cavity (chest cavity).  Another two possible  complications related to a similar event would include: Hemothorax and Chylothorax.   These are variations of the Pneumothorax, where instead of air around the collapsed  lung(s), you may have blood or chyle, respectively.       6. Spinal headaches: They may occur with any procedures in the area of the spine.       7. Persistent CSF (Cerebro-Spinal Fluid) leakage: This is a rare problem, but may occur  with prolonged intrathecal or  epidural catheters either due to the formation of a fistulous  track or a dural tear.       8. Nerve damage: By working so close to the spinal cord, there is always a possibility of  nerve damage, which could be as serious as a permanent spinal cord injury with  paralysis.       9. Death:  Although rare, severe deadly allergic reactions known as "Anaphylactic  reaction" can occur to any of the medications used.       10. Worsening of the symptoms:  We can always make thing worse.  What are the chances of something like this happening? Chances of any of this occuring are extremely low.  By statistics, you have more of a chance of getting killed in a motor vehicle accident: while driving to the hospital than any of the above occurring .  Nevertheless, you should be aware that they are possibilities.  In general, it is similar to taking a shower.  Everybody knows that you can slip, hit your head and get killed.  Does that mean that you should not shower again?  Nevertheless always keep in mind that statistics do not mean anything if you happen to be on the wrong side of them.  Even if a procedure has a 1 (one) in a 1,000,000 (million) chance of going wrong, it you happen to be that one..Also, keep in mind that by statistics, you have more of a chance of having something go wrong when taking medications.  Who should not have this procedure? If you are on a blood thinning medication (e.g. Coumadin, Plavix, see list of "Blood Thinners"), or if you have an active infection going on, you should not have the procedure.  If you are taking any blood thinners, please inform your physician.  How should I prepare for this procedure?  Do not eat or drink anything at least six hours prior to the procedure.  Bring a driver with you .  It cannot be a taxi.  Come accompanied by an adult that can drive you back, and that is strong enough to help you if your legs get weak or numb from the local anesthetic.  Take all of your medicines the morning of the procedure with just enough water to swallow them.  If you have diabetes, make sure that you are scheduled to have your procedure done first thing in the morning, whenever possible.  If you have diabetes, take only half of your insulin dose and notify our nurse that you have done so as soon as you arrive at the clinic.  If you are diabetic, but only take blood sugar pills (oral  hypoglycemic), then do not take them on the morning of your procedure.  You may take them after you have had the procedure.  Do not take aspirin or any aspirin-containing medications, at least eleven (11) days prior to the procedure.  They may prolong bleeding.  Wear loose fitting clothing that may be easy to take off and that you would not mind if it got stained with Betadine or blood.  Do not wear any jewelry or perfume  Remove any nail coloring.  It will interfere with some of our monitoring equipment.  NOTE: Remember that this is not meant to be interpreted as a complete list of all possible complications.  Unforeseen problems may occur.  BLOOD THINNERS The following drugs contain aspirin or other products, which can cause increased bleeding during surgery and should not be  taken for 2 weeks prior to and 1 week after surgery.  If you should need take something for relief of minor pain, you may take acetaminophen which is found in Tylenol,m Datril, Anacin-3 and Panadol. It is not blood thinner. The products listed below are.  Do not take any of the products listed below in addition to any listed on your instruction sheet.  A.P.C or A.P.C with Codeine Codeine Phosphate Capsules #3 Ibuprofen Ridaura  ABC compound Congesprin Imuran rimadil  Advil Cope Indocin Robaxisal  Alka-Seltzer Effervescent Pain Reliever and Antacid Coricidin or Coricidin-D  Indomethacin Rufen  Alka-Seltzer plus Cold Medicine Cosprin Ketoprofen S-A-C Tablets  Anacin Analgesic Tablets or Capsules Coumadin Korlgesic Salflex  Anacin Extra Strength Analgesic tablets or capsules CP-2 Tablets Lanoril Salicylate  Anaprox Cuprimine Capsules Levenox Salocol  Anexsia-D Dalteparin Magan Salsalate  Anodynos Darvon compound Magnesium Salicylate Sine-off  Ansaid Dasin Capsules Magsal Sodium Salicylate  Anturane Depen Capsules Marnal Soma  APF Arthritis pain formula Dewitt's Pills Measurin Stanback  Argesic Dia-Gesic Meclofenamic  Sulfinpyrazone  Arthritis Bayer Timed Release Aspirin Diclofenac Meclomen Sulindac  Arthritis pain formula Anacin Dicumarol Medipren Supac  Analgesic (Safety coated) Arthralgen Diffunasal Mefanamic Suprofen  Arthritis Strength Bufferin Dihydrocodeine Mepro Compound Suprol  Arthropan liquid Dopirydamole Methcarbomol with Aspirin Synalgos  ASA tablets/Enseals Disalcid Micrainin Tagament  Ascriptin Doan's Midol Talwin  Ascriptin A/D Dolene Mobidin Tanderil  Ascriptin Extra Strength Dolobid Moblgesic Ticlid  Ascriptin with Codeine Doloprin or Doloprin with Codeine Momentum Tolectin  Asperbuf Duoprin Mono-gesic Trendar  Aspergum Duradyne Motrin or Motrin IB Triminicin  Aspirin plain, buffered or enteric coated Durasal Myochrisine Trigesic  Aspirin Suppositories Easprin Nalfon Trillsate  Aspirin with Codeine Ecotrin Regular or Extra Strength Naprosyn Uracel  Atromid-S Efficin Naproxen Ursinus  Auranofin Capsules Elmiron Neocylate Vanquish  Axotal Emagrin Norgesic Verin  Azathioprine Empirin or Empirin with Codeine Normiflo Vitamin E  Azolid Emprazil Nuprin Voltaren  Bayer Aspirin plain, buffered or children's or timed BC Tablets or powders Encaprin Orgaran Warfarin Sodium  Buff-a-Comp Enoxaparin Orudis Zorpin  Buff-a-Comp with Codeine Equegesic Os-Cal-Gesic   Buffaprin Excedrin plain, buffered or Extra Strength Oxalid   Bufferin Arthritis Strength Feldene Oxphenbutazone   Bufferin plain or Extra Strength Feldene Capsules Oxycodone with Aspirin   Bufferin with Codeine Fenoprofen Fenoprofen Pabalate or Pabalate-SF   Buffets II Flogesic Panagesic   Buffinol plain or Extra Strength Florinal or Florinal with Codeine Panwarfarin   Buf-Tabs Flurbiprofen Penicillamine   Butalbital Compound Four-way cold tablets Penicillin   Butazolidin Fragmin Pepto-Bismol   Carbenicillin Geminisyn Percodan   Carna Arthritis Reliever Geopen Persantine   Carprofen Gold's salt Persistin   Chloramphenicol Goody's  Phenylbutazone   Chloromycetin Haltrain Piroxlcam   Clmetidine heparin Plaquenil   Cllnoril Hyco-pap Ponstel   Clofibrate Hydroxy chloroquine Propoxyphen         Before stopping any of these medications, be sure to consult the physician who ordered them.  Some, such as Coumadin (Warfarin) are ordered to prevent or treat serious conditions such as "deep thrombosis", "pumonary embolisms", and other heart problems.  The amount of time that you may need off of the medication may also vary with the medication and the reason for which you were taking it.  If you are taking any of these medications, please make sure you notify your pain physician before you undergo any procedures.         Lumbar Sympathetic Block Patient Information  Description: The lumbar plexus is a group of nerves that are part of the sympathetic nervous  system.  These nerves supply organs in the pelvis and legs.  Lumbar sympathetic blocks are utilized for the diagnosis and treatment of painful conditions in these areas.   The lumbar plexus is located on both sides of the aorta at approximately the level of the second lumbar vertebral body.  The block will be performed with you lying on your abdomen with a pillow underneath.  Using direct x-ray guidance,   The plexus will be located on both sides of the spine.  Numbing medicine will be used to deaden the skin prior to needle insertion.  In most cases, a small amount of sedation can be give by IV prior to the numbing medicine.  One or two small needles will be placed near the plexus and local anesthetic will be injected.  This may make your leg(s) feel warm.  The Entire block usually lasts about 15-25 minutes.  Conditions which may be treated by lumbar sympathetic block:   Reflex sympathetic dystrophy  Phantom limb pain  Peripheral neuropathy  Peripheral vascular disease ( inadequate blood flow )  Cancer pain of pelvis, leg and kidney  Preparation for the  injection:  1. Do note eat any solid food or diary products within 6 hours of your appointment. 2. You may drink clear liquids up to 2 hours before appointment.  Clear liquids include water, black coffee, juice or soda.  No milk or cream please. 3. You may take your regular medication, including pain medications, with a sip of water before you appointment.  Diabetics should hold regular insulin ( if taken separately ) and take 1/2 NPH dose the morning of the procedure .  Carry some sugar containing items with you to your appointment. 4. A driver must accompany you and be prepared to drive you home after your procedure. 5. Bring all your current medication with you. 6. An IV may be inserted and sedation may be given at the discretion of the physician.  7. A blood pressure cuff, EKG and other monitors will often be applied during the procedure.  Some patients may need to have extra oxygen administered for a short period. 8. You will be asked to provide medical information, including your allergies and medications, prior to the procedure.  We must know immediately if your taking blood thinners (like Coumadin/Warfarin) or if you are allergic to IV iodine contrast (dye).  We must know if you could possibly be pregnant.  Possible side-effects   Bleeding from needle site or deeper  Infection (rare, can require surgery)  Nerve injury (rare)  Numbness & tingling (temporary)  Collapsed lung (rare)  Spinal headache (a headache worse with upright posture)  Light-headedness (temporary)  Pain at injection site (several days)  Decreased blood pressure (temporary)  Weakness in legs (temporary)  Seizure or other drug reaction (rare)  Call if you experience:   Fever/chills associated with headache or increased back/ neck pain  Headache worsened by an upright position  New onset weakness or numbness of an extremity below the injection site  Hives or difficulty breathing ( go to the  emergency room)  Inflammation or drainage at the injections site(s)  New symptoms which are concerning to you  Please note:  If effective, we will often do a series of 2-3 injections spaced 3-6 weeks apart to maximally decrease your pain.  If initial series is effective, you may be a candidate for a more permanent block of the lumbar sympathetic plexus.  If you have any questions please call [phone]  [facility]

## 2015-07-11 NOTE — Progress Notes (Signed)
   Subjective:    Patient ID: Janet Murray, female    DOB: December 06, 1970, 45 y.o.   MRN: [id number]  HPI    Review of Systems     Objective:   Physical Exam        Assessment & Plan:     PLAN   Continue present medication Zanaflex and oxycodone  Lumbar sympathetic block to be performed at time of return appointment  F/U PCP Dr. Brynda Greathouse for evaliation of  BP and general medical  condition. Follow-up Dr. Brynda Greathouse regarding fractures of the foot as well as with surgeon as discussed  F/U surgical evaluation. Patient will follow-up with Dr. Elvina Mattes as discussed  F/U neurological evaluation. May consider pending follow-up evaluations  May consider radiofrequency rhizolysis or intraspinal procedures pending response to present treatment and F/U evaluation   Patient to call [facility] should patient have concerns prior to scheduled return appointment

## 2015-07-25 ENCOUNTER — Ambulatory Visit: Payer: Medicare Other | Admitting: Pain Medicine

## 2015-07-25 ENCOUNTER — Telehealth: Payer: Self-pay | Admitting: Pain Medicine

## 2015-07-25 NOTE — Telephone Encounter (Signed)
Not able to come in for appt 07-25-15, per vmail left by patient at 6:09 07-24-15 needs to resched

## 2015-07-25 NOTE — Telephone Encounter (Signed)
Juliann Pulse and [facility]  Please schedule patient for lumbar sympathetic block for Monday, 07/30/2015

## 2015-07-27 ENCOUNTER — Other Ambulatory Visit: Payer: Self-pay | Admitting: Pain Medicine

## 2015-08-08 ENCOUNTER — Encounter: Payer: Self-pay | Admitting: Pain Medicine

## 2015-08-08 ENCOUNTER — Ambulatory Visit: Payer: Medicare Other | Attending: Pain Medicine | Admitting: Pain Medicine

## 2015-08-08 VITALS — BP 119/76 | HR 71 | Temp 92.7°F | Resp 16 | Ht 61.0 in | Wt 172.0 lb

## 2015-08-08 DIAGNOSIS — Z8781 Personal history of (healed) traumatic fracture: Secondary | ICD-10-CM | POA: Diagnosis not present

## 2015-08-08 DIAGNOSIS — M79604 Pain in right leg: Secondary | ICD-10-CM | POA: Diagnosis present

## 2015-08-08 DIAGNOSIS — M51369 Other intervertebral disc degeneration, lumbar region without mention of lumbar back pain or lower extremity pain: Secondary | ICD-10-CM

## 2015-08-08 DIAGNOSIS — M160 Bilateral primary osteoarthritis of hip: Secondary | ICD-10-CM

## 2015-08-08 DIAGNOSIS — M79605 Pain in left leg: Secondary | ICD-10-CM | POA: Diagnosis present

## 2015-08-08 DIAGNOSIS — M4726 Other spondylosis with radiculopathy, lumbar region: Secondary | ICD-10-CM

## 2015-08-08 DIAGNOSIS — G5602 Carpal tunnel syndrome, left upper limb: Secondary | ICD-10-CM

## 2015-08-08 DIAGNOSIS — G2581 Restless legs syndrome: Secondary | ICD-10-CM

## 2015-08-08 DIAGNOSIS — M533 Sacrococcygeal disorders, not elsewhere classified: Secondary | ICD-10-CM

## 2015-08-08 DIAGNOSIS — M5481 Occipital neuralgia: Secondary | ICD-10-CM

## 2015-08-08 DIAGNOSIS — M47816 Spondylosis without myelopathy or radiculopathy, lumbar region: Secondary | ICD-10-CM

## 2015-08-08 DIAGNOSIS — M5416 Radiculopathy, lumbar region: Secondary | ICD-10-CM

## 2015-08-08 DIAGNOSIS — M5136 Other intervertebral disc degeneration, lumbar region: Secondary | ICD-10-CM

## 2015-08-08 DIAGNOSIS — S92912G Unspecified fracture of left toe(s), subsequent encounter for fracture with delayed healing: Secondary | ICD-10-CM

## 2015-08-08 MED ORDER — BUPIVACAINE HCL (PF) 0.25 % IJ SOLN: 30.0000 mL | Freq: Once | INTRAMUSCULAR | Status: DC

## 2015-08-08 MED ORDER — DOXYCYCLINE HYCLATE 100 MG PO TABS: 100.0000 mg | ORAL_TABLET | Freq: Two times a day (BID) | ORAL | Status: DC

## 2015-08-08 MED ORDER — BUPIVACAINE HCL (PF) 0.25 % IJ SOLN
INTRAMUSCULAR | Status: AC
  Administered 2015-08-08: 11:00:00
  Filled 2015-08-08: qty 30

## 2015-08-08 MED ORDER — CIPROFLOXACIN IN D5W 400 MG/200ML IV SOLN: 400.0000 mg | Freq: Once | INTRAVENOUS | Status: DC

## 2015-08-08 MED ORDER — ORPHENADRINE CITRATE 30 MG/ML IJ SOLN
INTRAMUSCULAR | Status: AC
  Filled 2015-08-08: qty 2

## 2015-08-08 MED ORDER — MIDAZOLAM HCL 5 MG/5ML IJ SOLN
5.0000 mg | Freq: Once | INTRAMUSCULAR | Status: DC
Start: 2015-08-08 — End: 2015-08-12

## 2015-08-08 MED ORDER — FENTANYL CITRATE (PF) 100 MCG/2ML IJ SOLN: 100.0000 ug | Freq: Once | INTRAMUSCULAR | Status: DC

## 2015-08-08 MED ORDER — CIPROFLOXACIN HCL 250 MG PO TABS: 250.0000 mg | ORAL_TABLET | Freq: Two times a day (BID) | ORAL | Status: DC

## 2015-08-08 MED ORDER — FENTANYL CITRATE (PF) 100 MCG/2ML IJ SOLN
INTRAMUSCULAR | Status: AC
  Administered 2015-08-08: 50 ug via INTRAVENOUS
  Filled 2015-08-08: qty 2

## 2015-08-08 MED ORDER — LIDOCAINE HCL (PF) 1 % IJ SOLN
INTRAMUSCULAR | Status: AC
  Filled 2015-08-08: qty 5

## 2015-08-08 MED ORDER — LACTATED RINGERS IV SOLN: 1000.0000 mL | INTRAVENOUS | Status: DC

## 2015-08-08 MED ORDER — TIZANIDINE HCL 2 MG PO CAPS: ORAL_CAPSULE | ORAL | Status: DC

## 2015-08-08 MED ORDER — TRIAMCINOLONE ACETONIDE 40 MG/ML IJ SUSP
INTRAMUSCULAR | Status: AC
  Filled 2015-08-08: qty 1

## 2015-08-08 MED ORDER — LIDOCAINE HCL (PF) 1 % IJ SOLN: 10.0000 mL | Freq: Once | INTRAMUSCULAR | Status: DC

## 2015-08-08 MED ORDER — OXYCODONE-ACETAMINOPHEN 5-325 MG PO TABS: ORAL_TABLET | ORAL | Status: DC

## 2015-08-08 MED ORDER — MIDAZOLAM HCL 5 MG/5ML IJ SOLN
INTRAMUSCULAR | Status: AC
  Administered 2015-08-08: 3 mg via INTRAVENOUS
  Filled 2015-08-08: qty 5

## 2015-08-08 MED ORDER — ORPHENADRINE CITRATE 30 MG/ML IJ SOLN: 60.0000 mg | Freq: Once | INTRAMUSCULAR | Status: DC

## 2015-08-08 NOTE — Patient Instructions (Addendum)
PLAN   Continue present medication Zanaflex and oxycodone. Please get doxycycline antibiotic today and begin taking doxycycline antibiotic today as prescribed  F/U PCP Dr. Brynda Greathouse for evaliation of  BP and general medical  condition  F/U surgical evaluation. Follow-up with Dr. Elvina Mattes as planned for fracture of foot  F/U neurological evaluation. May consider pending follow-up evaluations  May consider radiofrequency rhizolysis or intraspinal procedures pending response to present treatment and F/U evaluation   Patient to call [facility] should patient have concerns prior to scheduled return appointment.GENERAL RISKS AND COMPLICATIONS  What are the risk, side effects and possible complications? Generally speaking, most procedures are safe.  However, with any procedure there are risks, side effects, and the possibility of complications.  The risks and complications are dependent upon the sites that are lesioned, or the type of nerve block to be performed.  The closer the procedure is to the spine, the more serious the risks are.  Great care is taken when placing the radio frequency needles, block needles or lesioning probes, but sometimes complications can occur. 1. Infection: Any time there is an injection through the skin, there is a risk of infection.  This is why sterile conditions are used for these blocks.  There are four possible types of infection. 1. Localized skin infection. 2. Central Nervous System Infection-This can be in the form of Meningitis, which can be deadly. 3. Epidural Infections-This can be in the form of an epidural abscess, which can cause pressure inside of the spine, causing compression of the spinal cord with subsequent paralysis. This would require an emergency surgery to decompress, and there are no guarantees that the patient would recover from the paralysis. 4. Discitis-This is an infection of the intervertebral discs.  It occurs in about 1% of discography  procedures.  It is difficult to treat and it may lead to surgery.        2. Pain: the needles have to go through skin and soft tissues, will cause soreness.       3. Damage to internal structures:  The nerves to be lesioned may be near blood vessels or    other nerves which can be potentially damaged.       4. Bleeding: Bleeding is more common if the patient is taking blood thinners such as  aspirin, Coumadin, Ticiid, Plavix, etc., or if he/she have some genetic predisposition  such as hemophilia. Bleeding into the spinal canal can cause compression of the spinal  cord with subsequent paralysis.  This would require an emergency surgery to  decompress and there are no guarantees that the patient would recover from the  paralysis.       5. Pneumothorax:  Puncturing of a lung is a possibility, every time a needle is introduced in  the area of the chest or upper back.  Pneumothorax refers to free air around the  collapsed lung(s), inside of the thoracic cavity (chest cavity).  Another two possible  complications related to a similar event would include: Hemothorax and Chylothorax.   These are variations of the Pneumothorax, where instead of air around the collapsed  lung(s), you may have blood or chyle, respectively.       6. Spinal headaches: They may occur with any procedures in the area of the spine.       7. Persistent CSF (Cerebro-Spinal Fluid) leakage: This is a rare problem, but may occur  with prolonged intrathecal or epidural catheters either due to the formation of a  fistulous  track or a dural tear.       8. Nerve damage: By working so close to the spinal cord, there is always a possibility of  nerve damage, which could be as serious as a permanent spinal cord injury with  paralysis.       9. Death:  Although rare, severe deadly allergic reactions known as "Anaphylactic  reaction" can occur to any of the medications used.      10. Worsening of the symptoms:  We can always make thing worse.  What  are the chances of something like this happening? Chances of any of this occuring are extremely low.  By statistics, you have more of a chance of getting killed in a motor vehicle accident: while driving to the hospital than any of the above occurring .  Nevertheless, you should be aware that they are possibilities.  In general, it is similar to taking a shower.  Everybody knows that you can slip, hit your head and get killed.  Does that mean that you should not shower again?  Nevertheless always keep in mind that statistics do not mean anything if you happen to be on the wrong side of them.  Even if a procedure has a 1 (one) in a 1,000,000 (million) chance of going wrong, it you happen to be that one..Also, keep in mind that by statistics, you have more of a chance of having something go wrong when taking medications.  Who should not have this procedure? If you are on a blood thinning medication (e.g. Coumadin, Plavix, see list of "Blood Thinners"), or if you have an active infection going on, you should not have the procedure.  If you are taking any blood thinners, please inform your physician.  How should I prepare for this procedure?  Do not eat or drink anything at least six hours prior to the procedure.  Bring a driver with you .  It cannot be a taxi.  Come accompanied by an adult that can drive you back, and that is strong enough to help you if your legs get weak or numb from the local anesthetic.  Take all of your medicines the morning of the procedure with just enough water to swallow them.  If you have diabetes, make sure that you are scheduled to have your procedure done first thing in the morning, whenever possible.  If you have diabetes, take only half of your insulin dose and notify our nurse that you have done so as soon as you arrive at the clinic.  If you are diabetic, but only take blood sugar pills (oral hypoglycemic), then do not take them on the morning of your procedure.   You may take them after you have had the procedure.  Do not take aspirin or any aspirin-containing medications, at least eleven (11) days prior to the procedure.  They may prolong bleeding.  Wear loose fitting clothing that may be easy to take off and that you would not mind if it got stained with Betadine or blood.  Do not wear any jewelry or perfume  Remove any nail coloring.  It will interfere with some of our monitoring equipment.  NOTE: Remember that this is not meant to be interpreted as a complete list of all possible complications.  Unforeseen problems may occur.  BLOOD THINNERS The following drugs contain aspirin or other products, which can cause increased bleeding during surgery and should not be taken for 2 weeks prior to and 1 week  after surgery.  If you should need take something for relief of minor pain, you may take acetaminophen which is found in Tylenol,m Datril, Anacin-3 and Panadol. It is not blood thinner. The products listed below are.  Do not take any of the products listed below in addition to any listed on your instruction sheet.  A.P.C or A.P.C with Codeine Codeine Phosphate Capsules #3 Ibuprofen Ridaura  ABC compound Congesprin Imuran rimadil  Advil Cope Indocin Robaxisal  Alka-Seltzer Effervescent Pain Reliever and Antacid Coricidin or Coricidin-D  Indomethacin Rufen  Alka-Seltzer plus Cold Medicine Cosprin Ketoprofen S-A-C Tablets  Anacin Analgesic Tablets or Capsules Coumadin Korlgesic Salflex  Anacin Extra Strength Analgesic tablets or capsules CP-2 Tablets Lanoril Salicylate  Anaprox Cuprimine Capsules Levenox Salocol  Anexsia-D Dalteparin Magan Salsalate  Anodynos Darvon compound Magnesium Salicylate Sine-off  Ansaid Dasin Capsules Magsal Sodium Salicylate  Anturane Depen Capsules Marnal Soma  APF Arthritis pain formula Dewitt's Pills Measurin Stanback  Argesic Dia-Gesic Meclofenamic Sulfinpyrazone  Arthritis Bayer Timed Release Aspirin Diclofenac  Meclomen Sulindac  Arthritis pain formula Anacin Dicumarol Medipren Supac  Analgesic (Safety coated) Arthralgen Diffunasal Mefanamic Suprofen  Arthritis Strength Bufferin Dihydrocodeine Mepro Compound Suprol  Arthropan liquid Dopirydamole Methcarbomol with Aspirin Synalgos  ASA tablets/Enseals Disalcid Micrainin Tagament  Ascriptin Doan's Midol Talwin  Ascriptin A/D Dolene Mobidin Tanderil  Ascriptin Extra Strength Dolobid Moblgesic Ticlid  Ascriptin with Codeine Doloprin or Doloprin with Codeine Momentum Tolectin  Asperbuf Duoprin Mono-gesic Trendar  Aspergum Duradyne Motrin or Motrin IB Triminicin  Aspirin plain, buffered or enteric coated Durasal Myochrisine Trigesic  Aspirin Suppositories Easprin Nalfon Trillsate  Aspirin with Codeine Ecotrin Regular or Extra Strength Naprosyn Uracel  Atromid-S Efficin Naproxen Ursinus  Auranofin Capsules Elmiron Neocylate Vanquish  Axotal Emagrin Norgesic Verin  Azathioprine Empirin or Empirin with Codeine Normiflo Vitamin E  Azolid Emprazil Nuprin Voltaren  Bayer Aspirin plain, buffered or children's or timed BC Tablets or powders Encaprin Orgaran Warfarin Sodium  Buff-a-Comp Enoxaparin Orudis Zorpin  Buff-a-Comp with Codeine Equegesic Os-Cal-Gesic   Buffaprin Excedrin plain, buffered or Extra Strength Oxalid   Bufferin Arthritis Strength Feldene Oxphenbutazone   Bufferin plain or Extra Strength Feldene Capsules Oxycodone with Aspirin   Bufferin with Codeine Fenoprofen Fenoprofen Pabalate or Pabalate-SF   Buffets II Flogesic Panagesic   Buffinol plain or Extra Strength Florinal or Florinal with Codeine Panwarfarin   Buf-Tabs Flurbiprofen Penicillamine   Butalbital Compound Four-way cold tablets Penicillin   Butazolidin Fragmin Pepto-Bismol   Carbenicillin Geminisyn Percodan   Carna Arthritis Reliever Geopen Persantine   Carprofen Gold's salt Persistin   Chloramphenicol Goody's Phenylbutazone   Chloromycetin Haltrain Piroxlcam   Clmetidine  heparin Plaquenil   Cllnoril Hyco-pap Ponstel   Clofibrate Hydroxy chloroquine Propoxyphen         Before stopping any of these medications, be sure to consult the physician who ordered them.  Some, such as Coumadin (Warfarin) are ordered to prevent or treat serious conditions such as "deep thrombosis", "pumonary embolisms", and other heart problems.  The amount of time that you may need off of the medication may also vary with the medication and the reason for which you were taking it.  If you are taking any of these medications, please make sure you notify your pain physician before you undergo any procedures.

## 2015-08-08 NOTE — Progress Notes (Signed)
Subjective:    Patient ID: Janet Murray, female    DOB: 09-11-1970, 45 y.o.   MRN: [id number]  HPI  PROCEDURE PERFORMED: Lumbar sympathetic block.  HISTORY OF PRESENT ILLNESS: The patient is 45 y.o. female who returns to [place] for further evaluation and treatment of pain involving the lower extremities. The patient is with history of fracture of the left lower extremity with pain described as burning stinging sharp shooting pains of the left lower extremity. There is also been concern regarding delayed healing of the fracture. . There is concern regarding the patient's pain being due to fracture with neuralgias contributing to patient's symptomatology. There is also concern regarding development of complex regional pain syndrome. Decision has been made to proceed with lumbar sympathetic block in attempt to promote healing of the fracture and to avoid development of complex regional pain syndrome and to decrease severity of patient's symptoms and avoid progression of patient's symptoms. . The risks, benefits, and expectations of the procedure were discussed and explained to the patient who was understanding and wished to proceed with interventional treatment as planned.   DESCRIPTION OF PROCEDURE: Lumbar sympathetic block with IV Versed, IV fentanyl conscious sedation, EKG, blood pressure, pulse, pulse oximetry monitoring. The procedure was performed with the patient in prone position under fluoroscopic guidance.   NEEDLE PLACEMENT AT L2, Left side lumbar sympathetic block: With the patient in prone position and oblique orientation of 20 degrees, Betadine prep and local anesthetic skin wheal of 1.5% lidocaine plain was prepared at the proposed needle entry site. Under fluoroscopic guidance with 20 degrees oblique orientation, the 22 -gauge needle was inserted at the lateral border of the L2 vertebral body on the left side.   NEEDLE PLACEMENT AT L3, Left lumbar sympathetic block: With  the patient in the prone position and oblique orientation of 20 degrees, Betadine prep and local anesthetic skin wheal of 1.5% lidocaine plain was prepared at the proposed needle entry site. Under fluoroscopic guidance with 20 degrees  oblique orientation, the 22 -gauge needle was inserted at the lateral border of the L3 vertebral body on the left side.   NEEDLE PLACEMENT AT L4, Left side lumbar sympathetic block: With the patient in prone position and oblique orientation of 20 degrees, Betadine prep and local anesthetic skin wheal of 1.5% lidocaine plain was prepared at the proposed needle entry site. Under fluoroscopic guidance with 20 degrees  oblique orientation, the 22 -gauge needle was inserted at the lateral border of the L4 vertebral body on the left side.    Following needle placement at the L2, L3 and L4 vertebral body levels on the left side, needle placement was then verified on lateral view with tip of the needle documented to be in the anterior third of the vertebral body of L2, L3 and L4 respectively. Following negative aspiration of each needle for heme and CSF, L2 vertebral body level needle was injected with 10 mL of 0.25% bupivacaine. L3 vertebral body level needle was injected with 10 mL of 0.25% bupivacaine. L4 vertebral body level was injected with 10 mL of 0.25% bupivacaine. Needles were removed. Please note temperature readings prior to lumbar sympathetic block were noted to be 90.2 degrees Fahrenheit and following completion of the lumbar sympathetic block temperature readings of the lower extremity were noted to be 92.6 degrees Fahrenheit. The patient tolerated the procedure well.  PLAN:   1. Medications: We will continue presently prescribed medications Zanaflex and oxycodone at this time. 2. The  patient is to follow-up with primary care physician Dr. Brynda Greathouse for further evaluation of blood pressure and general medical condition as discussed. 3. Surgical evaluation as discussed..  Patient will follow-up with Dr. Elvina Mattes 4. Neurological evaluation as discussed.. May consider [facility] EMG studies 5. The patient may be candidate for radiofrequency procedures, implantation devices, and other treatment pending response to treatment and follow-up evaluation. 6. The patient has been advised to adhere to proper body mechanics. 7. The patient has been advised to call the [facility] prior to scheduled return appointment should there be significant change in condition or have other concerns regarding condition prior to scheduled return appointment.   The patient was understanding and in agreement with suggested treatment plan.   Review of Systems     Objective:   Physical Exam        Assessment & Plan:

## 2015-08-08 NOTE — Progress Notes (Signed)
Safety precautions to be maintained throughout the outpatient stay will include: orient to surroundings, keep bed in low position, maintain call bell within reach at all times, provide assistance with transfer out of bed and ambulation.  

## 2015-08-09 ENCOUNTER — Telehealth: Payer: Self-pay | Admitting: *Deleted

## 2015-08-09 NOTE — Telephone Encounter (Signed)
Patient verbalizes no questions or concerns from procedure on yesterday. 

## 2015-08-12 ENCOUNTER — Emergency Department: Payer: Medicare Other

## 2015-08-12 ENCOUNTER — Emergency Department
Admission: EM | Admit: 2015-08-12 | Discharge: 2015-08-12 | Disposition: A | Discharge status: Home / Self Care | Payer: Medicare Other | Attending: Emergency Medicine | Admitting: Emergency Medicine

## 2015-08-12 ENCOUNTER — Encounter: Payer: Self-pay | Admitting: Emergency Medicine

## 2015-08-12 DIAGNOSIS — Y9389 Activity, other specified: Secondary | ICD-10-CM | POA: Diagnosis not present

## 2015-08-12 DIAGNOSIS — S99922A Unspecified injury of left foot, initial encounter: Secondary | ICD-10-CM | POA: Diagnosis present

## 2015-08-12 DIAGNOSIS — Z880 Allergy status to penicillin: Secondary | ICD-10-CM | POA: Insufficient documentation

## 2015-08-12 DIAGNOSIS — S93602A Unspecified sprain of left foot, initial encounter: Secondary | ICD-10-CM | POA: Insufficient documentation

## 2015-08-12 DIAGNOSIS — Z791 Long term (current) use of non-steroidal anti-inflammatories (NSAID): Secondary | ICD-10-CM | POA: Insufficient documentation

## 2015-08-12 DIAGNOSIS — F17210 Nicotine dependence, cigarettes, uncomplicated: Secondary | ICD-10-CM | POA: Diagnosis not present

## 2015-08-12 DIAGNOSIS — W108XXA Fall (on) (from) other stairs and steps, initial encounter: Secondary | ICD-10-CM | POA: Diagnosis not present

## 2015-08-12 DIAGNOSIS — Z7951 Long term (current) use of inhaled steroids: Secondary | ICD-10-CM | POA: Insufficient documentation

## 2015-08-12 DIAGNOSIS — Y9289 Other specified places as the place of occurrence of the external cause: Secondary | ICD-10-CM | POA: Insufficient documentation

## 2015-08-12 DIAGNOSIS — Z79899 Other long term (current) drug therapy: Secondary | ICD-10-CM | POA: Diagnosis not present

## 2015-08-12 DIAGNOSIS — Y998 Other external cause status: Secondary | ICD-10-CM | POA: Diagnosis not present

## 2015-08-12 DIAGNOSIS — Z792 Long term (current) use of antibiotics: Secondary | ICD-10-CM | POA: Insufficient documentation

## 2015-08-12 MED ORDER — OXYCODONE-ACETAMINOPHEN 5-325 MG PO TABS
2.0000 | ORAL_TABLET | Freq: Once | ORAL | Status: AC
  Administered 2015-08-12: 2 via ORAL
  Filled 2015-08-12: qty 2

## 2015-08-12 NOTE — ED Notes (Signed)
Ace wrap applied by Vivien Rota, EDT.

## 2015-08-12 NOTE — ED Provider Notes (Signed)
[facility] Emergency Department Provider Note  ____________________________________________  Time seen: Approximately 3:59 PM  I have reviewed the triage vital signs and the nursing notes.   HISTORY  Chief Complaint Foot Pain    HPI Janet Murray is a 45 y.o. female who presents for evaluation of left foot pain. Patient states that she fell  approximately 2 hours ago injuring her left foot. Patient reports that she recently had a broken left foot and was released from orthopedic care 2 weeks ago. Complains of pain both dorsally and plantarly.   Past Medical History  Diagnosis Date  . Urinary incontinence   . Arthritis   . GERD (gastroesophageal reflux disease)   . Chronic pain   . Bronchitis   . Degenerative joint disease (DJD) of hip   . Foot fracture, left 05/02/15    Patient Active Problem List   Diagnosis Date Noted  . Lumbar radiculopathy 11/08/2014  . Hereditary and idiopathic peripheral neuropathy 11/06/2014  . 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 Facet syndrome, lumbar 10/25/2014  . Sacroiliac joint dysfunction of both sides 10/25/2014  . Carpal tunnel syndrome of left wrist 10/25/2014  . Occipital neuralgia 10/25/2014  . Restless leg syndrome 10/25/2014  . DDD (degenerative disc disease), lumbar 10/25/2014    Past Surgical History  Procedure Laterality Date  . Bladder surgery    . Cesarean section    . Appendectomy    . Abdominal hysterectomy      Current Outpatient Rx  Name  Route  Sig  Dispense  Refill  . albuterol (PROVENTIL HFA;VENTOLIN HFA) 108 (90 BASE) MCG/ACT inhaler   Inhalation   Inhale 2 puffs into the lungs as needed for wheezing or shortness of breath.         . budesonide-formoterol (SYMBICORT) 80-4.5 MCG/ACT inhaler   Inhalation   Inhale 2 puffs into the lungs 2 (two) times daily.         Marland Kitchen doxycycline (VIBRA-TABS) 100 MG tablet   Oral   Take 1 tablet (100 mg total)  by mouth 2 (two) times daily.   14 tablet   Eval   . esomeprazole (NEXIUM) 40 MG capsule   Oral   Take 40 mg by mouth 2 (two) times daily.         Marland Kitchen levothyroxine (SYNTHROID, LEVOTHROID) 50 MCG tablet   Oral   Take 50 mcg by mouth daily.         Marland Kitchen LORazepam (ATIVAN) 0.5 MG tablet   Oral   Take 0.5 mg by mouth every 8 (eight) hours as needed for anxiety.         . meloxicam (MOBIC) 15 MG tablet   Oral   Take 15 mg by mouth daily.         . montelukast (SINGULAIR) 10 MG tablet   Oral   Take 10 mg by mouth at bedtime.         . ondansetron (ZOFRAN) 4 MG tablet   Oral   Take 1 tablet (4 mg total) by mouth every 8 (eight) hours as needed for nausea or vomiting.   15 tablet   0   . oxyCODONE-acetaminophen (PERCOCET/ROXICET) 5-325 MG tablet      Limit one half to 1 tab by mouth per day 2-4 times per day if tolerated a day if tolerated   120 tablet   0   . phentermine 30 MG capsule   Oral   Take 30 mg by  mouth 2 (two) times daily.         . pramipexole (MIRAPEX) 0.5 MG tablet   Oral   Take 0.5 mg by mouth at bedtime.         . solifenacin (VESICARE) 5 MG tablet   Oral   Take 5 mg by mouth daily.         . tizanidine (ZANAFLEX) 2 MG capsule      Limit 1 to 2 tabs by mouth per day or twice a day if tolerated   120 capsule   0   . topiramate (TOPAMAX) 25 MG capsule   Oral   Take 25 mg by mouth daily. 1-2 tablets           Allergies Penicillins; Ciprofloxacin; Hydrocodone; Aspirin; and Tape  Family History  Problem Relation Age of Onset  . Arthritis Father   . Heart disease Father   . Hypertension Father     Social History Social History  Substance Use Topics  . Smoking status: Current Every Day Smoker -- 1.00 packs/day for 20 years    Types: Cigarettes  . Smokeless tobacco: Never Used  . Alcohol Use: No    Review of Systems Cardiovascular: Denies chest pain. Respiratory: Denies shortness of breath. Musculoskeletal: Positive for  left foot pain. No ecchymosis or bruising or edema noted. Skin: Negative for rash. Neurological: Negative for headaches, focal weakness or numbness.  10-point ROS otherwise negative.  ____________________________________________   PHYSICAL EXAM: BP 110/73 mmHg  Pulse 71  Temp(Src) 98.5 F (36.9 C) (Oral)  Resp 18  Ht 5\' 1"  (1.549 m)  Wt 78.019 kg  BMI 32.52 kg/m2  SpO2 99%  VITAL SIGNS: ED Triage Vitals  Enc Vitals Group     BP --      Pulse --      Resp --      Temp --      Temp src --      SpO2 --      Weight --      Height --      Head Cir --      Peak Flow --      Pain Score --      Pain Loc --      Pain Edu? --      Excl. in [place]? --     Constitutional: Alert and oriented. Well appearing and in no acute distress. Musculoskeletal: Left foot with limited range of motion. Distally neurovascularly intact. Pedal pulses present. No ecchymosis or bruising noted. No edema. Neurologic:  Normal speech and language. No gross focal neurologic deficits are appreciated. No gait instability. Skin:  Skin is warm, dry and intact. No rash noted. Psychiatric: Mood and affect are normal. Speech and behavior are normal.  ____________________________________________   LABS (all labs ordered are listed, but only abnormal results are displayed)  Labs Reviewed - No data to display   RADIOLOGY   ____________________________________________   PROCEDURES  Procedure(s) performed: None  Critical Care performed: No  ____________________________________________   INITIAL IMPRESSION / ASSESSMENT AND PLAN / ED COURSE  Pertinent labs & imaging results that were available during my care of the patient were reviewed by me and considered in my medical decision making (see chart for details).  Status post fall of the leg he left foot sprain. Evidence of old fracture noted nothing acute. We'll treat his acute left foot sprain. Rx given for Percocet 5/325 follow-up with orthopedics  for reevaluation in 24-48 hours. ____________________________________________  FINAL CLINICAL IMPRESSION(S) / ED DIAGNOSES  Final diagnoses:  Foot sprain, left, initial encounter     This chart was dictated using voice recognition software/Dragon. Despite best efforts to proofread, errors can occur which can change the meaning. Any change was purely unintentional.   Arlyss Repress, PA-C 08/12/15 1930  Nance Pear, MD 08/14/15 [phone]

## 2015-08-12 NOTE — Discharge Instructions (Signed)

## 2015-08-12 NOTE — ED Notes (Addendum)
Pt recently released from [facility] on 2/13.  Pt fell down the steps onto left foot again and thinks its broken. Thinks her foot gave away.  Original break was in November 2016.  Fall happened around 2 hours ago.  Pt recently had shots in her back on 3/1 and is doxycyline prophylaxis.

## 2015-08-12 NOTE — ED Notes (Signed)
NAD noted at time of D/C. Pt denies questions or concerns. Pt ambulatory to the lobby at this time.  

## 2015-09-02 ENCOUNTER — Emergency Department
Admission: EM | Admit: 2015-09-02 | Discharge: 2015-09-02 | Disposition: A | Discharge status: Home / Self Care | Payer: Medicare Other | Attending: Emergency Medicine | Admitting: Emergency Medicine

## 2015-09-02 ENCOUNTER — Encounter: Payer: Self-pay | Admitting: Emergency Medicine

## 2015-09-02 ENCOUNTER — Emergency Department: Payer: Medicare Other

## 2015-09-02 DIAGNOSIS — S80211A Abrasion, right knee, initial encounter: Secondary | ICD-10-CM | POA: Insufficient documentation

## 2015-09-02 DIAGNOSIS — Y998 Other external cause status: Secondary | ICD-10-CM | POA: Diagnosis not present

## 2015-09-02 DIAGNOSIS — Z79899 Other long term (current) drug therapy: Secondary | ICD-10-CM | POA: Diagnosis not present

## 2015-09-02 DIAGNOSIS — Y9289 Other specified places as the place of occurrence of the external cause: Secondary | ICD-10-CM | POA: Insufficient documentation

## 2015-09-02 DIAGNOSIS — M79672 Pain in left foot: Secondary | ICD-10-CM

## 2015-09-02 DIAGNOSIS — S8001XA Contusion of right knee, initial encounter: Secondary | ICD-10-CM | POA: Diagnosis not present

## 2015-09-02 DIAGNOSIS — M8580 Other specified disorders of bone density and structure, unspecified site: Secondary | ICD-10-CM | POA: Insufficient documentation

## 2015-09-02 DIAGNOSIS — Z791 Long term (current) use of non-steroidal anti-inflammatories (NSAID): Secondary | ICD-10-CM | POA: Insufficient documentation

## 2015-09-02 DIAGNOSIS — Y9389 Activity, other specified: Secondary | ICD-10-CM | POA: Diagnosis not present

## 2015-09-02 DIAGNOSIS — F17210 Nicotine dependence, cigarettes, uncomplicated: Secondary | ICD-10-CM | POA: Insufficient documentation

## 2015-09-02 DIAGNOSIS — W01198A Fall on same level from slipping, tripping and stumbling with subsequent striking against other object, initial encounter: Secondary | ICD-10-CM | POA: Diagnosis not present

## 2015-09-02 DIAGNOSIS — Z880 Allergy status to penicillin: Secondary | ICD-10-CM | POA: Diagnosis not present

## 2015-09-02 DIAGNOSIS — S99922A Unspecified injury of left foot, initial encounter: Secondary | ICD-10-CM | POA: Diagnosis present

## 2015-09-02 NOTE — ED Provider Notes (Signed)
[facility] Emergency Department Provider Note  ____________________________________________  Time seen: Approximately 4:12 PM  I have reviewed the triage vital signs and the nursing notes.   HISTORY  Chief Complaint Foot Pain    HPI Janet Murray is a 45 y.o. female presents emergency department complaining of left foot and right knee pain. Per the patient she was carrying a load of 30 close when her foot gave way and she stumbled falling 14 striking her right knee. Patient states that she has had a history of a left foot fracture to the fourth and fifth metatarsals. This was originally diagnosed in November. She has been seeing podiatry, Dr. Elvina Mattes, for same. She states that she is cleared by podiatry to perform normal activities. Patient is now endorsing severe left foot pain over the lateral midfoot. Patient also is complaining of right knee pain where she struck her knee. She endorses a mild abrasion to the anterior aspect of the right knee.   Past Medical History  Diagnosis Date  . Urinary incontinence   . Arthritis   . GERD (gastroesophageal reflux disease)   . Chronic pain   . Bronchitis   . Degenerative joint disease (DJD) of hip   . Foot fracture, left 05/02/15    Patient Active Problem List   Diagnosis Date Noted  . Lumbar radiculopathy 11/08/2014  . Hereditary and idiopathic peripheral neuropathy 11/06/2014  . 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 Facet syndrome, lumbar 10/25/2014  . Sacroiliac joint dysfunction of both sides 10/25/2014  . Carpal tunnel syndrome of left wrist 10/25/2014  . Occipital neuralgia 10/25/2014  . Restless leg syndrome 10/25/2014  . DDD (degenerative disc disease), lumbar 10/25/2014    Past Surgical History  Procedure Laterality Date  . Bladder surgery    . Cesarean section    . Appendectomy    . Abdominal hysterectomy      Current Outpatient Rx  Name  Route  Sig   Dispense  Refill  . albuterol (PROVENTIL HFA;VENTOLIN HFA) 108 (90 BASE) MCG/ACT inhaler   Inhalation   Inhale 2 puffs into the lungs as needed for wheezing or shortness of breath.         . budesonide-formoterol (SYMBICORT) 80-4.5 MCG/ACT inhaler   Inhalation   Inhale 2 puffs into the lungs 2 (two) times daily.         Marland Kitchen doxycycline (VIBRA-TABS) 100 MG tablet   Oral   Take 1 tablet (100 mg total) by mouth 2 (two) times daily.   14 tablet   Eval   . esomeprazole (NEXIUM) 40 MG capsule   Oral   Take 40 mg by mouth 2 (two) times daily.         Marland Kitchen levothyroxine (SYNTHROID, LEVOTHROID) 50 MCG tablet   Oral   Take 50 mcg by mouth daily.         Marland Kitchen LORazepam (ATIVAN) 0.5 MG tablet   Oral   Take 0.5 mg by mouth every 8 (eight) hours as needed for anxiety.         . meloxicam (MOBIC) 15 MG tablet   Oral   Take 15 mg by mouth daily.         . montelukast (SINGULAIR) 10 MG tablet   Oral   Take 10 mg by mouth at bedtime.         . ondansetron (ZOFRAN) 4 MG tablet   Oral   Take 1 tablet (4 mg total) by mouth every  8 (eight) hours as needed for nausea or vomiting.   15 tablet   0   . oxyCODONE-acetaminophen (PERCOCET/ROXICET) 5-325 MG tablet      Limit one half to 1 tab by mouth per day 2-4 times per day if tolerated a day if tolerated   120 tablet   0   . phentermine 30 MG capsule   Oral   Take 30 mg by mouth 2 (two) times daily.         . pramipexole (MIRAPEX) 0.5 MG tablet   Oral   Take 0.5 mg by mouth at bedtime.         . solifenacin (VESICARE) 5 MG tablet   Oral   Take 5 mg by mouth daily.         . tizanidine (ZANAFLEX) 2 MG capsule      Limit 1 to 2 tabs by mouth per day or twice a day if tolerated   120 capsule   0   . topiramate (TOPAMAX) 25 MG capsule   Oral   Take 25 mg by mouth daily. 1-2 tablets           Allergies Penicillins; Ciprofloxacin; Hydrocodone; Aspirin; and Tape  Family History  Problem Relation Age of Onset   . Arthritis Father   . Heart disease Father   . Hypertension Father     Social History Social History  Substance Use Topics  . Smoking status: Current Every Day Smoker -- 1.00 packs/day for 20 years    Types: Cigarettes  . Smokeless tobacco: Never Used  . Alcohol Use: No     Review of Systems  Constitutional: No fever/chills Cardiovascular: no chest pain. Respiratory: no cough. No SOB. Musculoskeletal: Negative for back pain.Positive for left foot and right knee pain. Skin: Negative for rash. Neurological: Negative for headaches, focal weakness or numbness. 10-point ROS otherwise negative.  ____________________________________________   PHYSICAL EXAM:  VITAL SIGNS: ED Triage Vitals  Enc Vitals Group     BP 09/02/15 1549 101/83 mmHg     Pulse Rate 09/02/15 1549 71     Resp 09/02/15 1549 18     Temp 09/02/15 1549 98.1 F (36.7 C)     Temp Source 09/02/15 1549 Oral     SpO2 09/02/15 1549 98 %     Weight 09/02/15 1549 168 lb (76.204 kg)     Height 09/02/15 1549 5\' 1"  (1.549 m)     Head Cir --      Peak Flow --      Pain Score 09/02/15 1550 10     Pain Loc --      Pain Edu? --      Excl. in [place]? --      Constitutional: Alert and oriented. Well appearing and in no acute distress. Cardiovascular: Normal rate, regular rhythm. Normal S1 and S2.  Good peripheral circulation. Respiratory: Normal respiratory effort without tachypnea or retractions. Lungs CTAB. Musculoskeletal: No visible deformity to left foot but inspection. Full range of motion ankle. Limited range of motion of digits due to pain. Patient is tender to palpation over the third, fourth, and fifth metatarsals. No palpable abnormality. Dorsalis pedis pulses appreciated. Sensation intact 5 digits. No deformity noted to right knee. Mild edema and an abrasion noted to the anterior aspect of the knee. Full range of motion to knee. Patient is diffusely tender to palpation over the anterior aspect with no point  tenderness. Varus, valgus, Lachman's are negative. McMurray's is negative. Dorsalis pedis pulse  is appreciated distally. Sensation intact distally. Neurologic:  Normal speech and language. No gross focal neurologic deficits are appreciated.  Skin:  Skin is warm, dry and intact. No rash noted. Psychiatric: Mood and affect are normal. Speech and behavior are normal. Patient exhibits appropriate insight and judgement.   ____________________________________________   LABS (all labs ordered are listed, but only abnormal results are displayed)  Labs Reviewed - No data to display ____________________________________________  EKG   ____________________________________________  RADIOLOGY Diamantina Providence Allah Reason, personally viewed and evaluated these images (plain radiographs) as part of my medical decision making, as well as reviewing the written report by the radiologist.  Dg Knee 2 Views Right  09/02/2015  CLINICAL DATA:  Pt states she ws just here 3 weeks ago with foot pain after a bunch of cans fell on her foot at [employer]-known fractures. Pt today was doing laundry and tripped over a crate and started having more pain in left foot and a laceration and swelling on her right knee. EXAM: RIGHT KNEE - 1-2 VIEW COMPARISON:  None. FINDINGS: No fracture. No bone lesion. Joint is normally spaced and aligned. No joint effusion. Mild anterior subcutaneous soft tissue edema. No radiopaque foreign body. IMPRESSION: 1. No fracture. No knee joint abnormality. No radiopaque foreign body. Electronically Signed   By: Lajean Manes M.D.   On: 09/02/2015 17:09   Dg Foot Complete Left  09/02/2015  CLINICAL DATA:  History of left foot fractures three weeks ago. The patient tripped today with onset of left foot pain with a laceration. Initial encounter. EXAM: LEFT FOOT - COMPLETE 3+ VIEW COMPARISON:  Plain films left foot 08/12/2015 05/02/2005. FINDINGS: Remote healed fourth and fifth metatarsal fractures are  unchanged in appearance. There is no acute bony or joint abnormality. Bones are osteopenic. Soft tissues are unremarkable. IMPRESSION: No acute abnormality. Remote healed fourth and fifth metatarsal fractures. Osteopenia. Electronically Signed   By: Inge Rise M.D.   On: 09/02/2015 17:10    ____________________________________________    PROCEDURES  Procedure(s) performed:       Medications - No data to display   ____________________________________________   INITIAL IMPRESSION / ASSESSMENT AND PLAN / ED COURSE  Pertinent labs & imaging results that were available during my care of the patient were reviewed by me and considered in my medical decision making (see chart for details).  Patient's diagnosis is consistent with left foot and right knee pain. Patient has chronic pain medication prescribed to her by primary care and she may take this for symptom control. Patient is to follow up with Dr. Elvina Mattes with podiatry if symptoms persist past this treatment course. Patient is given ED precautions to return to the ED for any worsening or new symptoms.     ____________________________________________  FINAL CLINICAL IMPRESSION(S) / ED DIAGNOSES  Final diagnoses:  Foot pain, left  Knee contusion, right, initial encounter  Osteopenia      NEW MEDICATIONS STARTED DURING THIS VISIT:  New Prescriptions   No medications on file        This chart was dictated using voice recognition software/Dragon. Despite best efforts to proofread, errors can occur which can change the meaning. Any change was purely unintentional.    Darletta Moll, PA-C 09/02/15 1721  Daymon Larsen, MD 09/02/15 [phone]

## 2015-09-02 NOTE — Discharge Instructions (Signed)
Contusion °A contusion is a deep bruise. Contusions are the result of a blunt injury to tissues and muscle fibers under the skin. The injury causes bleeding under the skin. The skin overlying the contusion may turn blue, purple, or yellow. Minor injuries will give you a painless contusion, but more severe contusions may stay painful and swollen for a few weeks.  °CAUSES  °This condition is usually caused by a blow, trauma, or direct force to an area of the body. °SYMPTOMS  °Symptoms of this condition include: °· Swelling of the injured area. °· Pain and tenderness in the injured area. °· Discoloration. The area may have redness and then turn blue, purple, or yellow. °DIAGNOSIS  °This condition is diagnosed based on a physical exam and medical history. An X-ray, CT scan, or MRI may be needed to determine if there are any associated injuries, such as broken bones (fractures). °TREATMENT  °Specific treatment for this condition depends on what area of the body was injured. In general, the best treatment for a contusion is resting, icing, applying pressure to (compression), and elevating the injured area. This is often called the RICE strategy. Over-the-counter anti-inflammatory medicines may also be recommended for pain control.  °HOME CARE INSTRUCTIONS  °· Rest the injured area. °· If directed, apply ice to the injured area: °· Put ice in a plastic bag. °· Place a towel between your skin and the bag. °· Leave the ice on for 20 minutes, 2-3 times per day. °· If directed, apply light compression to the injured area using an elastic bandage. Make sure the bandage is not wrapped too tightly. Remove and reapply the bandage as directed by your health care provider. °· If possible, raise (elevate) the injured area above the level of your heart while you are sitting or lying down. °· Take over-the-counter and prescription medicines only as told by your health care provider. °SEEK MEDICAL CARE IF: °· Your symptoms do not  improve after several days of treatment. °· Your symptoms get worse. °· You have difficulty moving the injured area. °SEEK IMMEDIATE MEDICAL CARE IF:  °· You have severe pain. °· You have numbness in a hand or foot. °· Your hand or foot turns pale or cold. °  °This information is not intended to replace advice given to you by your health care provider. Make sure you discuss any questions you have with your health care provider. °  °Document Released: 03/05/2005 Document Revised: 02/14/2015 Document Reviewed: 10/11/2014 °Elsevier Interactive Patient Education ©2016 Elsevier Inc[employer] ° °Cryotherapy °Cryotherapy means treatment with cold. Ice or gel packs can be used to reduce both pain and swelling. Ice is the most helpful within the first 24 to 48 hours after an injury or flare-up from overusing a muscle or joint. Sprains, strains, spasms, burning pain, shooting pain, and aches can all be eased with ice. Ice can also be used when recovering from surgery. Ice is effective, has very few side effects, and is safe for most people to use. °PRECAUTIONS  °Ice is not a safe treatment option for people with: °· Raynaud phenomenon. This is a condition affecting small blood vessels in the extremities. Exposure to cold may cause your problems to return. °· Cold hypersensitivity. There are many forms of cold hypersensitivity, including: °¨ Cold urticaria. Red, itchy hives appear on the skin when the tissues begin to warm after being iced. °¨ Cold erythema. This is a red, itchy rash caused by exposure to cold. °¨ Cold hemoglobinuria. Red blood cells   break down when the tissues begin to warm after being iced. The hemoglobin that carry oxygen are passed into the urine because they cannot combine with blood proteins fast enough. °· Numbness or altered sensitivity in the area being iced. °If you have any of the following conditions, do not use ice until you have discussed cryotherapy with your caregiver: °· Heart conditions, such as  arrhythmia, angina, or chronic heart disease. °· High blood pressure. °· Healing wounds or open skin in the area being iced. °· Current infections. °· Rheumatoid arthritis. °· Poor circulation. °· Diabetes. °Ice slows the blood flow in the region it is applied. This is beneficial when trying to stop inflamed tissues from spreading irritating chemicals to surrounding tissues. However, if you expose your skin to cold temperatures for too long or without the proper protection, you can damage your skin or nerves. Watch for signs of skin damage due to cold. °HOME CARE INSTRUCTIONS °Follow these tips to use ice and cold packs safely. °· Place a dry or damp towel between the ice and skin. A damp towel will cool the skin more quickly, so you may need to shorten the time that the ice is used. °· For a more rapid response, add gentle compression to the ice. °· Ice for no more than 10 to 20 minutes at a time. The bonier the area you are icing, the less time it will take to get the benefits of ice. °· Check your skin after 5 minutes to make sure there are no signs of a poor response to cold or skin damage. °· Rest 20 minutes or more between uses. °· Once your skin is numb, you can end your treatment. You can test numbness by very lightly touching your skin. The touch should be so light that you do not see the skin dimple from the pressure of your fingertip. When using ice, most people will feel these normal sensations in this order: cold, burning, aching, and numbness. °· Do not use ice on someone who cannot communicate their responses to pain, such as small children or people with dementia. °HOW TO MAKE AN ICE PACK °Ice packs are the most common way to use ice therapy. Other methods include ice massage, ice baths, and cryosprays. Muscle creams that cause a cold, tingly feeling do not offer the same benefits that ice offers and should not be used as a substitute unless recommended by your caregiver. °To make an ice pack, do one  of the following: °· Place crushed ice or a bag of frozen vegetables in a sealable plastic bag. Squeeze out the excess air. Place this bag inside another plastic bag. Slide the bag into a pillowcase or place a damp towel between your skin and the bag. °· Mix 3 parts water with 1 part rubbing alcohol. Freeze the mixture in a sealable plastic bag. When you remove the mixture from the freezer, it will be slushy. Squeeze out the excess air. Place this bag inside another plastic bag. Slide the bag into a pillowcase or place a damp towel between your skin and the bag. °SEEK MEDICAL CARE IF: °· You develop white spots on your skin. This may give the skin a blotchy (mottled) appearance. °· Your skin turns blue or pale. °· Your skin becomes waxy or hard. °· Your swelling gets worse. °MAKE SURE YOU:  °· Understand these instructions. °· Will watch your condition. °· Will get help right away if you are not doing well or get worse. °  °  This information is not intended to replace advice given to you by your health care provider. Make sure you discuss any questions you have with your health care provider.   Document Released: 01/20/2011 Document Revised: 06/16/2014 Document Reviewed: 01/20/2011 Elsevier Interactive Patient Education 2016 Elsevier Inc.  Musculoskeletal Pain Musculoskeletal pain is muscle and boney aches and pains. These pains can occur in any part of the body. Your caregiver may treat you without knowing the cause of the pain. They may treat you if blood or urine tests, X-rays, and other tests were normal.  CAUSES There is often not a definite cause or reason for these pains. These pains may be caused by a type of germ (virus). The discomfort may also come from overuse. Overuse includes working out too hard when your body is not fit. Boney aches also come from weather changes. Bone is sensitive to atmospheric pressure changes. HOME CARE INSTRUCTIONS   Ask when your test results will be ready. Make sure  you get your test results.  Only take over-the-counter or prescription medicines for pain, discomfort, or fever as directed by your caregiver. If you were given medications for your condition, do not drive, operate machinery or power tools, or sign legal documents for 24 hours. Do not drink alcohol. Do not take sleeping pills or other medications that may interfere with treatment.  Continue all activities unless the activities cause more pain. When the pain lessens, slowly resume normal activities. Gradually increase the intensity and duration of the activities or exercise.  During periods of severe pain, bed rest may be helpful. Lay or sit in any position that is comfortable.  Putting ice on the injured area.  Put ice in a bag.  Place a towel between your skin and the bag.  Leave the ice on for 15 to 20 minutes, 3 to 4 times a day.  Follow up with your caregiver for continued problems and no reason can be found for the pain. If the pain becomes worse or does not go away, it may be necessary to repeat tests or do additional testing. Your caregiver may need to look further for a possible cause. SEEK IMMEDIATE MEDICAL CARE IF:  You have pain that is getting worse and is not relieved by medications.  You develop chest pain that is associated with shortness or breath, sweating, feeling sick to your stomach (nauseous), or throw up (vomit).  Your pain becomes localized to the abdomen.  You develop any new symptoms that seem different or that concern you. MAKE SURE YOU:   Understand these instructions.  Will watch your condition.  Will get help right away if you are not doing well or get worse.   This information is not intended to replace advice given to you by your health care provider. Make sure you discuss any questions you have with your health care provider.   Document Released: 05/26/2005 Document Revised: 08/18/2011 Document Reviewed: 01/28/2013 Elsevier Interactive Patient  Education [employer].

## 2015-09-02 NOTE — ED Notes (Signed)
Pt states her right knee is also hurting from her recent fall.

## 2015-09-02 NOTE — ED Notes (Signed)
Pt verbalized understanding of discharge instructions. NAD at this time. 

## 2015-09-02 NOTE — ED Notes (Signed)
Patient presents to the ED with left foot pain post fall.  Patient states she was carrying dirty clothes and tripped and fell.  Patient reports history of left foot fracture and issues.  Patient reports seeing Dr. Elvina Mattes for this.  Patient also has an abrasion to her right foot and right knee.  Patient states right foot and right knee are painful but not as painful as left foot.  Patient is alert and oriented at this time.

## 2015-09-04 ENCOUNTER — Emergency Department
Admission: EM | Admit: 2015-09-04 | Discharge: 2015-09-04 | Disposition: A | Discharge status: Home / Self Care | Payer: Medicare Other | Attending: Emergency Medicine | Admitting: Emergency Medicine

## 2015-09-04 ENCOUNTER — Encounter: Payer: Self-pay | Admitting: Emergency Medicine

## 2015-09-04 DIAGNOSIS — F17210 Nicotine dependence, cigarettes, uncomplicated: Secondary | ICD-10-CM | POA: Diagnosis not present

## 2015-09-04 DIAGNOSIS — Z880 Allergy status to penicillin: Secondary | ICD-10-CM | POA: Diagnosis not present

## 2015-09-04 DIAGNOSIS — Z79899 Other long term (current) drug therapy: Secondary | ICD-10-CM | POA: Diagnosis not present

## 2015-09-04 DIAGNOSIS — Z791 Long term (current) use of non-steroidal anti-inflammatories (NSAID): Secondary | ICD-10-CM | POA: Insufficient documentation

## 2015-09-04 DIAGNOSIS — N644 Mastodynia: Secondary | ICD-10-CM | POA: Insufficient documentation

## 2015-09-04 DIAGNOSIS — M79672 Pain in left foot: Secondary | ICD-10-CM

## 2015-09-04 DIAGNOSIS — Z7951 Long term (current) use of inhaled steroids: Secondary | ICD-10-CM | POA: Insufficient documentation

## 2015-09-04 NOTE — ED Notes (Signed)
Was seen here Sunday after she fell and rolled her left ankle, back for pain from the same this am. Pt also c/o left "boob pain" from her fall as well.

## 2015-09-04 NOTE — Discharge Instructions (Signed)
Cryotherapy Cryotherapy is when you put ice on your injury. Ice helps lessen pain and puffiness (swelling) after an injury. Ice works the best when you start using it in the first 24 to 48 hours after an injury. HOME CARE  Put a dry or damp towel between the ice pack and your skin.  You may press gently on the ice pack.  Leave the ice on for no more than 10 to 20 minutes at a time.  Check your skin after 5 minutes to make sure your skin is okay.  Rest at least 20 minutes between ice pack uses.  Stop using ice when your skin loses feeling (numbness).  Do not use ice on someone who cannot tell you when it hurts. This includes small children and people with memory problems (dementia). GET HELP RIGHT AWAY IF:  You have white spots on your skin.  Your skin turns blue or pale.  Your skin feels waxy or hard.  Your puffiness gets worse. MAKE SURE YOU:   Understand these instructions.  Will watch your condition.  Will get help right away if you are not doing well or get worse.   This information is not intended to replace advice given to you by your health care provider. Make sure you discuss any questions you have with your health care provider.   Document Released: 11/12/2007 Document Revised: 08/18/2011 Document Reviewed: 01/16/2011 Elsevier Interactive Patient Education 2016 [place] should continue to dose your previously prescribed pain and inflammation meds. Rest with the foot elevated and apply ice to reduce swelling. Follow-up with Dr. Elvina Mattes as directed.

## 2015-09-04 NOTE — ED Notes (Signed)
See triage note  States she rolled her left foot on Sunday  conts to have pain  To left foot with swelling  Was seen on Sunday for same. Also now having pain to left breast area from same fall.  Positive pulses good sensation

## 2015-09-04 NOTE — ED Provider Notes (Signed)
[facility] Emergency Department Provider Note ____________________________________________  Time seen: 1030  I have reviewed the triage vital signs and the nursing notes.  HISTORY  Chief Complaint  Foot Pain and Breast Pain   HPI Janet Murray is a 45 y.o. female returns to the ED, 2 days following evaluation and treatment for an injury to the left foot. Patient describes she was carrying laundry when she initially fell. X-rays were found to be negative and she was referred back to Dr. Elvina Mattes for further evaluation. She returns today citing continued pain to the left foot. She also now is complaining of some pain to the left breast due to the same fall. She denies any reinjury in the interim. She rates her pain at 8/10 in triage.  Past Medical History  Diagnosis Date  . Urinary incontinence   . Arthritis   . GERD (gastroesophageal reflux disease)   . Chronic pain   . Bronchitis   . Degenerative joint disease (DJD) of hip   . Foot fracture, left 05/02/15    Patient Active Problem List   Diagnosis Date Noted  . Lumbar radiculopathy 11/08/2014  . Hereditary and idiopathic peripheral neuropathy 11/06/2014  . 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 Facet syndrome, lumbar 10/25/2014  . Sacroiliac joint dysfunction of both sides 10/25/2014  . Carpal tunnel syndrome of left wrist 10/25/2014  . Occipital neuralgia 10/25/2014  . Restless leg syndrome 10/25/2014  . DDD (degenerative disc disease), lumbar 10/25/2014    Past Surgical History  Procedure Laterality Date  . Bladder surgery    . Cesarean section    . Appendectomy    . Abdominal hysterectomy      Current Outpatient Rx  Name  Route  Sig  Dispense  Refill  . albuterol (PROVENTIL HFA;VENTOLIN HFA) 108 (90 BASE) MCG/ACT inhaler   Inhalation   Inhale 2 puffs into the lungs as needed for wheezing or shortness of breath.         . budesonide-formoterol  (SYMBICORT) 80-4.5 MCG/ACT inhaler   Inhalation   Inhale 2 puffs into the lungs 2 (two) times daily.         Marland Kitchen doxycycline (VIBRA-TABS) 100 MG tablet   Oral   Take 1 tablet (100 mg total) by mouth 2 (two) times daily.   14 tablet   Eval   . esomeprazole (NEXIUM) 40 MG capsule   Oral   Take 40 mg by mouth 2 (two) times daily.         Marland Kitchen levothyroxine (SYNTHROID, LEVOTHROID) 50 MCG tablet   Oral   Take 50 mcg by mouth daily.         Marland Kitchen LORazepam (ATIVAN) 0.5 MG tablet   Oral   Take 0.5 mg by mouth every 8 (eight) hours as needed for anxiety.         . meloxicam (MOBIC) 15 MG tablet   Oral   Take 15 mg by mouth daily.         . montelukast (SINGULAIR) 10 MG tablet   Oral   Take 10 mg by mouth at bedtime.         . ondansetron (ZOFRAN) 4 MG tablet   Oral   Take 1 tablet (4 mg total) by mouth every 8 (eight) hours as needed for nausea or vomiting.   15 tablet   0   . oxyCODONE-acetaminophen (PERCOCET/ROXICET) 5-325 MG tablet      Limit one half to 1 tab by  mouth per day 2-4 times per day if tolerated a day if tolerated   120 tablet   0   . phentermine 30 MG capsule   Oral   Take 30 mg by mouth 2 (two) times daily.         . pramipexole (MIRAPEX) 0.5 MG tablet   Oral   Take 0.5 mg by mouth at bedtime.         . solifenacin (VESICARE) 5 MG tablet   Oral   Take 5 mg by mouth daily.         . tizanidine (ZANAFLEX) 2 MG capsule      Limit 1 to 2 tabs by mouth per day or twice a day if tolerated   120 capsule   0   . topiramate (TOPAMAX) 25 MG capsule   Oral   Take 25 mg by mouth daily. 1-2 tablets          Allergies Penicillins; Ciprofloxacin; Hydrocodone; Aspirin; and Tape  Family History  Problem Relation Age of Onset  . Arthritis Father   . Heart disease Father   . Hypertension Father     Social History Social History  Substance Use Topics  . Smoking status: Current Every Day Smoker -- 1.00 packs/day for 20 years    Types:  Cigarettes  . Smokeless tobacco: Never Used  . Alcohol Use: No   Review of Systems  Constitutional: Negative for fever. Cardiovascular: Negative for chest pain. Respiratory: Negative for shortness of breath. Musculoskeletal: Negative for back pain. Left foot pain as above. Skin: Negative for rash. Neurological: Negative for headaches, focal weakness or numbness. ____________________________________________  PHYSICAL EXAM:  VITAL SIGNS: ED Triage Vitals  Enc Vitals Group     BP 09/04/15 0849 132/94 mmHg     Pulse Rate 09/04/15 0849 76     Resp 09/04/15 0849 18     Temp 09/04/15 0849 98.7 F (37.1 C)     Temp Source 09/04/15 0849 Oral     SpO2 09/04/15 0849 95 %     Weight 09/04/15 0849 168 lb (76.204 kg)     Height 09/04/15 0849 5\' 1"  (1.549 m)     Head Cir --      Peak Flow --      Pain Score 09/04/15 0850 8     Pain Loc --      Pain Edu? --      Excl. in [place]? --    Constitutional: Alert and oriented. Well appearing and in no distress. Head: Normocephalic and atraumatic. Eyes: Conjunctivae are normal. PERRL. Normal extraocular movements Hematological/Lymphatic/Immunological: No cervical lymphadenopathy. Cardiovascular: Normal rate, regular rhythm. Normal distal pulses. Respiratory: Normal respiratory effort.  Breast: Left breast without obvious deformity, edema, erythema, or ecchymosis. No skin changes are noted. No nipple discharge is appreciated. No palpable lesions, masses, or hematoma. Musculoskeletal: Left foot with some mild dorsal swelling noted from the third metatarsal to the lateral aspect of the foot. Patient with normal ankle range of motion on exam. She is with normal toe movement without difficulty. Nontender with normal range of motion in all extremities.  Neurologic: Cranial nerves II through XII grossly intact. Normal toe sensation. Normal gait without ataxia. Normal speech and language. No gross focal neurologic deficits are appreciated. Skin:  Skin is  warm, dry and intact. No rash noted. Psychiatric: Mood and affect are normal. Patient exhibits appropriate insight and judgment. ____________________________________________  INITIAL IMPRESSION / ASSESSMENT AND PLAN / ED COURSE  Patient with left  foot pain continued following a mechanical injury 2 days prior. She is discharged with ejection for continued [place] management. Due to her acute since 50 to light touch. Patient will not be fitted with an Ace bandage at her request. She is to continue to take her prescription motor vehicle and Percocet as previously prescribed. She will call Dr. Christain Sacramento for follow-up as directed previously. ____________________________________________  FINAL CLINICAL IMPRESSION(S) / ED DIAGNOSES  Final diagnoses:  Foot pain, left      Melvenia Needles, PA-C 09/04/15 1058  Earleen Newport, MD 09/04/15 1145

## 2015-09-06 ENCOUNTER — Ambulatory Visit: Payer: Medicare Other | Attending: Pain Medicine | Admitting: Pain Medicine

## 2015-09-06 ENCOUNTER — Encounter: Payer: Self-pay | Admitting: Pain Medicine

## 2015-09-06 VITALS — BP 127/86 | HR 76 | Temp 98.0°F | Resp 18 | Ht 61.0 in | Wt 168.0 lb

## 2015-09-06 DIAGNOSIS — X58XXXA Exposure to other specified factors, initial encounter: Secondary | ICD-10-CM | POA: Insufficient documentation

## 2015-09-06 DIAGNOSIS — S92912G Unspecified fracture of left toe(s), subsequent encounter for fracture with delayed healing: Secondary | ICD-10-CM

## 2015-09-06 DIAGNOSIS — M5136 Other intervertebral disc degeneration, lumbar region: Secondary | ICD-10-CM

## 2015-09-06 DIAGNOSIS — M4806 Spinal stenosis, lumbar region: Secondary | ICD-10-CM | POA: Diagnosis not present

## 2015-09-06 DIAGNOSIS — M533 Sacrococcygeal disorders, not elsewhere classified: Secondary | ICD-10-CM

## 2015-09-06 DIAGNOSIS — G5600 Carpal tunnel syndrome, unspecified upper limb: Secondary | ICD-10-CM | POA: Insufficient documentation

## 2015-09-06 DIAGNOSIS — M79605 Pain in left leg: Secondary | ICD-10-CM | POA: Diagnosis present

## 2015-09-06 DIAGNOSIS — S92902A Unspecified fracture of left foot, initial encounter for closed fracture: Secondary | ICD-10-CM | POA: Diagnosis not present

## 2015-09-06 DIAGNOSIS — M47896 Other spondylosis, lumbar region: Secondary | ICD-10-CM | POA: Insufficient documentation

## 2015-09-06 DIAGNOSIS — M5481 Occipital neuralgia: Secondary | ICD-10-CM

## 2015-09-06 DIAGNOSIS — M160 Bilateral primary osteoarthritis of hip: Secondary | ICD-10-CM

## 2015-09-06 DIAGNOSIS — M7060 Trochanteric bursitis, unspecified hip: Secondary | ICD-10-CM | POA: Diagnosis not present

## 2015-09-06 DIAGNOSIS — M4726 Other spondylosis with radiculopathy, lumbar region: Secondary | ICD-10-CM

## 2015-09-06 DIAGNOSIS — G2581 Restless legs syndrome: Secondary | ICD-10-CM

## 2015-09-06 DIAGNOSIS — G5602 Carpal tunnel syndrome, left upper limb: Secondary | ICD-10-CM

## 2015-09-06 DIAGNOSIS — M5416 Radiculopathy, lumbar region: Secondary | ICD-10-CM

## 2015-09-06 DIAGNOSIS — M545 Low back pain: Secondary | ICD-10-CM | POA: Diagnosis present

## 2015-09-06 DIAGNOSIS — M65822 Other synovitis and tenosynovitis, left upper arm: Secondary | ICD-10-CM | POA: Diagnosis not present

## 2015-09-06 DIAGNOSIS — M47816 Spondylosis without myelopathy or radiculopathy, lumbar region: Secondary | ICD-10-CM

## 2015-09-06 MED ORDER — TIZANIDINE HCL 2 MG PO CAPS: ORAL_CAPSULE | ORAL | Status: DC

## 2015-09-06 MED ORDER — OXYCODONE-ACETAMINOPHEN 5-325 MG PO TABS: ORAL_TABLET | ORAL | Status: DC

## 2015-09-06 NOTE — Patient Instructions (Addendum)
PLAN   Continue present medication Zanaflex and oxycodone  Lumbar sympathetic block to be performed at time of return appointment  F/U PCP Dr. Brynda Greathouse for evaliation of  BP and general medical  condition. Follow-up Dr. Brynda Greathouse regarding fractures of the foot as well as with surgeon as discussed  F/U surgical evaluation. Patient will follow-up with Dr. Elvina Mattes as discussed  F/U neurological evaluation. May consider pending follow-up evaluations  May consider radiofrequency rhizolysis or intraspinal procedures pending response to present treatment and F/U evaluation   Patient to call [facility] should patient have concerns prior to scheduled return appointmentLumbar Sympathetic Block Patient Information  Description: The lumbar plexus is a group of nerves that are part of the sympathetic nervous system.  These nerves supply organs in the pelvis and legs.  Lumbar sympathetic blocks are utilized for the diagnosis and treatment of painful conditions in these areas.   The lumbar plexus is located on both sides of the aorta at approximately the level of the second lumbar vertebral body.  The block will be performed with you lying on your abdomen with a pillow underneath.  Using direct x-ray guidance,   The plexus will be located on both sides of the spine.  Numbing medicine will be used to deaden the skin prior to needle insertion.  In most cases, a small amount of sedation can be give by IV prior to the numbing medicine.  One or two small needles will be placed near the plexus and local anesthetic will be injected.  This may make your leg(s) feel warm.  The Entire block usually lasts about 15-25 minutes.  Conditions which may be treated by lumbar sympathetic block:   Reflex sympathetic dystrophy  Phantom limb pain  Peripheral neuropathy  Peripheral vascular disease ( inadequate blood flow )  Cancer pain of pelvis, leg and kidney  Preparation for the injection:  1. Do note eat any  solid food or diary products within 8 hours of your appointment. 2. You may drink clear liquids up to 3 hours before appointment.  Clear liquids include water, black coffee, juice or soda.  No milk or cream please. 3. You may take your regular medication, including pain medications, with a sip of water before you appointment.  Diabetics should hold regular insulin ( if taken separately ) and take 1/2 NPH dose the morning of the procedure .  Carry some sugar containing items with you to your appointment. 4. A driver must accompany you and be prepared to drive you home after your procedure. 5. Bring all your current medication with you. 6. An IV may be inserted and sedation may be given at the discretion of the physician.  7. A blood pressure cuff, EKG and other monitors will often be applied during the procedure.  Some patients may need to have extra oxygen administered for a short period. 8. You will be asked to provide medical information, including your allergies and medications, prior to the procedure.  We must know immediately if your taking blood thinners (like Coumadin/Warfarin) or if you are allergic to IV iodine contrast (dye).  We must know if you could possibly be pregnant.  Possible side-effects   Bleeding from needle site or deeper  Infection (rare, can require surgery)  Nerve injury (rare)  Numbness & tingling (temporary)  Collapsed lung (rare)  Spinal headache (a headache worse with upright posture)  Light-headedness (temporary)  Pain at injection site (several days)  Decreased blood pressure (temporary)  Weakness in legs (  temporary)  Seizure or other drug reaction (rare)  Call if you experience:   Fever/chills associated with headache or increased back/ neck pain  Headache worsened by an upright position  New onset weakness or numbness of an extremity below the injection site  Hives or difficulty breathing ( go to the emergency room)  Inflammation or  drainage at the injections site(s)  New symptoms which are concerning to you  Please note:  If effective, we will often do a series of 2-3 injections spaced 3-6 weeks apart to maximally decrease your pain.  If initial series is effective, you may be a candidate for a more permanent block of the lumbar sympathetic plexus.  If you have any questions please call [phone] [facility]

## 2015-09-06 NOTE — Progress Notes (Signed)
Subjective:    Patient ID: Janet Murray, female    DOB: 11-26-1970, 45 y.o.   MRN: [id number]  HPI  The patient is a 45 year old female who returns to pain management for further evaluation and treatment of pain involving the lower back lower extremity region especially the left lower extremity with prior fracture of the left foot. The patient continues under the care of Dr. Elvina Mattes at this time. The patient states that she also recently fell sustaining injury to her right knee and has significant pain involving the region of the hip on the right. After evaluation the patient patient appeared to be with significant tends to palpation of the greater trochanteric region. We discussed patient's condition involving the lower back lower extremity region especially the region of the left foot and will proceed with lumbar sympathetic block of the left foot in attempt to promote healing of patient's fracture and avoid progression of patient's symptoms and avoid the need for more involved treatment. The patient was with understanding and agreed to suggested treatment plan. The patient will continue medications consisting of Zanaflex and oxycodone at this time. All agreed to suggested treatment plan     Review of Systems     Objective:   Physical Exam  There was tenderness to palpation of the splenius capitis and occipitalis region a mild degree with mild tenderness over the cervical facet cervical paraspinal musculature region. Palpation of the thoracic facet thoracic paraspinal muscular region was attends to palpation of mild degree with no crepitus of the thoracic region noted. There was moderate muscle spasm of the lower thoracic region with no crepitus of the thoracic region noted. Palpation over the region of the acromioclavicular and glenohumeral joint regions were without increase of pain of significant degree. The patient appeared to be with decreased grip strength on the left compared to the  right. Tinel and Phalen's maneuver were associated with mild to moderate discomfort. Palpation over the lumbar paraspinal musculatures and lumbar facet region was tends to palpation of moderate degree with lateral bending rotation extension and palpation of the lumbar facets reproducing moderate discomfort. Straight leg raise was tolerates approximately 20 without a definite increase of pain with dorsiflexion on the right. No dorsiflexion was attempted on the left. There was tenderness to palpation of the left lower extremities were areas of increased sensitivity to touch. No definite sensory deficit or dermatomal distribution detected of the lower extremity is. There was negative Homans. Abdomen nontender with no costovertebral tenderness noted      Assessment & Plan:     Fracture of left foot (concern regarding changes suggestive of early complex regional pain syndrome)  L4-L5 level degenerative changes noted on prior MRI  Lumbar stenosis with neurogenic claudication  Lumbar facet syndrome  Lumbar radiculopathy  Carpal tunnel syndrome  Tenosynovitis of the left upper extremity  Greater trochanteric bursitis     PLAN   Continue present medication Zanaflex and oxycodone  Lumbar sympathetic block to be performed at time of return appointment  F/U PCP Dr. Brynda Greathouse for evaliation of  BP and general medical  condition. Follow-up Dr. Brynda Greathouse regarding fractures of the foot as well as with surgeon as discussed  F/U surgical evaluation. Patient will follow-up with Dr. Elvina Mattes as discussed  F/U neurological evaluation. May consider pending follow-up evaluations  May consider radiofrequency rhizolysis or intraspinal procedures pending response to present treatment and F/U evaluation   Patient to call [facility] should patient have concerns prior to scheduled return  appointment

## 2015-09-06 NOTE — Progress Notes (Signed)
Safety precautions to be maintained throughout the outpatient stay will include: orient to surroundings, keep bed in low position, maintain call bell within reach at all times, provide assistance with transfer out of bed and ambulation.  

## 2015-09-12 ENCOUNTER — Ambulatory Visit: Payer: Medicare Other | Admitting: Pain Medicine

## 2015-09-25 ENCOUNTER — Telehealth: Payer: Self-pay | Admitting: *Deleted

## 2015-09-25 ENCOUNTER — Telehealth: Payer: Self-pay

## 2015-09-25 NOTE — Telephone Encounter (Signed)
Nurses If patient is still sick patient does not need to come to clinic Have patient see her primary care physician and primary care physician can  decide if patient can come to the clinic for treatment

## 2015-09-25 NOTE — Telephone Encounter (Signed)
Left voicemail  to inform patient that Dr Primus Bravo does not want to perform procedure if she is sick.  She would need to go to PCP for current illness and reschedule appt for procedure. There is a 10/04/15 appt scheduled which maybe the actual rescheduled procedure.

## 2015-09-25 NOTE — Telephone Encounter (Signed)
Pt says Dr. Primus Bravo called her concerning her missed appointment. Pt was suppose to have procedure that day. Pt says he did not leave a message on her voice mail and she wants to know what does she need to do because she is still sick

## 2015-09-25 NOTE — Telephone Encounter (Signed)
Dr. Primus Bravo              Do you need [place] to call pateint?

## 2015-09-25 NOTE — Telephone Encounter (Signed)
Left voicemail with patient informing of Dr Primus Bravo response.

## 2015-10-04 ENCOUNTER — Ambulatory Visit: Payer: Medicare Other | Attending: Pain Medicine | Admitting: Pain Medicine

## 2015-10-04 ENCOUNTER — Encounter: Payer: Self-pay | Admitting: Pain Medicine

## 2015-10-04 VITALS — BP 132/78 | HR 71 | Temp 97.9°F | Resp 18 | Ht 61.0 in | Wt 172.0 lb

## 2015-10-04 DIAGNOSIS — G5602 Carpal tunnel syndrome, left upper limb: Secondary | ICD-10-CM

## 2015-10-04 DIAGNOSIS — M51369 Other intervertebral disc degeneration, lumbar region without mention of lumbar back pain or lower extremity pain: Secondary | ICD-10-CM

## 2015-10-04 DIAGNOSIS — M5481 Occipital neuralgia: Secondary | ICD-10-CM

## 2015-10-04 DIAGNOSIS — M5136 Other intervertebral disc degeneration, lumbar region: Secondary | ICD-10-CM

## 2015-10-04 DIAGNOSIS — M5416 Radiculopathy, lumbar region: Secondary | ICD-10-CM

## 2015-10-04 DIAGNOSIS — M533 Sacrococcygeal disorders, not elsewhere classified: Secondary | ICD-10-CM

## 2015-10-04 DIAGNOSIS — G2581 Restless legs syndrome: Secondary | ICD-10-CM

## 2015-10-04 DIAGNOSIS — M160 Bilateral primary osteoarthritis of hip: Secondary | ICD-10-CM

## 2015-10-04 DIAGNOSIS — M47816 Spondylosis without myelopathy or radiculopathy, lumbar region: Secondary | ICD-10-CM

## 2015-10-04 DIAGNOSIS — M4726 Other spondylosis with radiculopathy, lumbar region: Secondary | ICD-10-CM

## 2015-10-04 MED ORDER — OXYCODONE-ACETAMINOPHEN 5-325 MG PO TABS: ORAL_TABLET | ORAL | Status: DC

## 2015-10-04 MED ORDER — TIZANIDINE HCL 2 MG PO CAPS: ORAL_CAPSULE | ORAL | Status: DC

## 2015-10-04 NOTE — Patient Instructions (Addendum)
PLAN   Continue present medication Zanaflex and oxycodone  Lumbar sympathetic block to be performed at time of return appointment  F/U PCP Dr. Brynda Greathouse for evaliation of  BP and general medical  condition. Follow-up Dr. Brynda Greathouse regarding fractures of the foot as well as with surgeon as discussed and began on today's visit  F/U surgical evaluation. Patient will follow-up with Dr. Elvina Mattes as discussed  F/U neurological evaluation. May consider PNCV/EMG studies and other studies pending follow-up evaluations  May consider radiofrequency rhizolysis or intraspinal procedures pending response to present treatment and F/U evaluation   Patient to call [facility] should patient have concerns prior to scheduled return appointment Lumbar Sympathetic Block Patient Information  Description: The lumbar plexus is a group of nerves that are part of the sympathetic nervous system.  These nerves supply organs in the pelvis and legs.  Lumbar sympathetic blocks are utilized for the diagnosis and treatment of painful conditions in these areas.   The lumbar plexus is located on both sides of the aorta at approximately the level of the second lumbar vertebral body.  The block will be performed with you lying on your abdomen with a pillow underneath.  Using direct x-ray guidance,   The plexus will be located on both sides of the spine.  Numbing medicine will be used to deaden the skin prior to needle insertion.  In most cases, a small amount of sedation can be give by IV prior to the numbing medicine.  One or two small needles will be placed near the plexus and local anesthetic will be injected.  This may make your leg(s) feel warm.  The Entire block usually lasts about 15-25 minutes.  Conditions which may be treated by lumbar sympathetic block:   Reflex sympathetic dystrophy  Phantom limb pain  Peripheral neuropathy  Peripheral vascular disease ( inadequate blood flow )  Cancer pain of pelvis, leg  and kidney  Preparation for the injection:  1. Do note eat any solid food or diary products within 8 hours of your appointment. 2. You may drink clear liquids up to 3 hours before appointment.  Clear liquids include water, black coffee, juice or soda.  No milk or cream please. 3. You may take your regular medication, including pain medications, with a sip of water before you appointment.  Diabetics should hold regular insulin ( if taken separately ) and take 1/2 NPH dose the morning of the procedure .  Carry some sugar containing items with you to your appointment. 4. A driver must accompany you and be prepared to drive you home after your procedure. 5. Bring all your current medication with you. 6. An IV may be inserted and sedation may be given at the discretion of the physician.  7. A blood pressure cuff, EKG and other monitors will often be applied during the procedure.  Some patients may need to have extra oxygen administered for a short period. 8. You will be asked to provide medical information, including your allergies and medications, prior to the procedure.  We must know immediately if your taking blood thinners (like Coumadin/Warfarin) or if you are allergic to IV iodine contrast (dye).  We must know if you could possibly be pregnant.  Possible side-effects   Bleeding from needle site or deeper  Infection (rare, can require surgery)  Nerve injury (rare)  Numbness & tingling (temporary)  Collapsed lung (rare)  Spinal headache (a headache worse with upright posture)  Light-headedness (temporary)  Pain at injection site (  several days)  Decreased blood pressure (temporary)  Weakness in legs (temporary)  Seizure or other drug reaction (rare)  Call if you experience:   Fever/chills associated with headache or increased back/ neck pain  Headache worsened by an upright position  New onset weakness or numbness of an extremity below the injection site  Hives or  difficulty breathing ( go to the emergency room)  Inflammation or drainage at the injections site(s)  New symptoms which are concerning to you  Please note:  If effective, we will often do a series of 2-3 injections spaced 3-6 weeks apart to maximally decrease your pain.  If initial series is effective, you may be a candidate for a more permanent block of the lumbar sympathetic plexus.  If you have any questions please call [phone] [facility]

## 2015-10-04 NOTE — Progress Notes (Signed)
Safety precautions to be maintained throughout the outpatient stay will include: orient to surroundings, keep bed in low position, maintain call bell within reach at all times, provide assistance with transfer out of bed and ambulation.  

## 2015-10-04 NOTE — Progress Notes (Signed)
Subjective:    Patient ID: Janet Murray, female    DOB: [birth year], 45 y.o.   MRN: [id number]  HPI  The patient is a 45 year old female who returns to pain management for further evaluation and treatment of pain involving the lower back and lower extremity region with pain in follow up in the region of the left foot status post fracture of the left foot. The patient states that the pain is associated with cold sensation of the left lower extremity compared to the right lower extremity. The patient states that she has fallen and has sustained some injury to her knee as well. The patient denies any other significant change in condition of significant events. Patient states the pain is a burning stinging throbbing sensation involving the left lower extremity regions present throughout the day. The patient will follow-up with Dr. Elvina Mattes for further assessment and treatment of her condition as discussed and we will proceed with lumbar sympathetic block to be performed at time return appointment in attempt to decrease severity of patient's symptoms, minimize progression of patient's symptoms, and avoid the need for more involved treatment. The patient will continue Zanaflex and oxycodone as prescribed at this time. All are in agreement with suggested treatment plan  Review of Systems     Objective:   Physical Exam  Tenderness to palpation over the region of the splenius capitis and occipitalis musculature regions of mild degree with mild tenderness of the cervical facet cervical paraspinal musculature region. Palpation of the acromioclavicular and glenohumeral joint region was associated with mild discomfort with patient appeared to be with unremarkable Spurling's maneuver. The patient appeared to be with decreased grip strength on the left compared to the right with mild tenderness to palpation in the region of the wrist with Tinel and Phalen's maneuver reproducing pain of mild degree. Patient was with  tenderness to palpation over the thoracic facet thoracic paraspinal must reason with moderate muscle spasms noted in the lower thoracic region with no crepitus of the thoracic region noted. Palpation over the lumbar paraspinal must reason lumbar facet region was attends to palpation with lateral bending rotation extension and palpation of the lumbar facets reproducing moderate discomfort with mild to moderate tenderness of the PSIS and PII S region. Straight leg raise was tolerates approximately 20 with decreased EHL on the left compared to the right with the left foot appearing somewhat swollen and with decreased range of motion to the right foot. There was what appeared to be decreased hip of the left compared to the right foot. No new lesions of the lower extremities were noted. There was increased sensitivity to touch of the left lower extremity compared to the right lower extremity. There was negative Homans. Abdomen was nontender with no costovertebral tenderness noted      Assessment & Plan:    Fracture of left foot (concern regarding changes suggestive of early complex regional pain syndrome)  L4-L5 level degenerative changes noted on prior MRI  Lumbar stenosis with neurogenic claudication  Lumbar facet syndrome  Lumbar radiculopathy  Carpal tunnel syndrome  Tenosynovitis of the left upper extremity     PLAN   Continue present medication Zanaflex and oxycodone  Lumbar sympathetic block to be performed at time of return appointment  F/U PCP Dr. Brynda Greathouse for evaliation of  BP and general medical  condition. Follow-up Dr. Brynda Greathouse regarding fractures of the foot as well as with surgeon as discussed and began on today's visit  F/U surgical evaluation. Patient will  follow-up with Dr. Elvina Mattes as discussed  F/U neurological evaluation. May consider PNCV/EMG studies and other studies pending follow-up evaluations  May consider radiofrequency rhizolysis or intraspinal procedures pending  response to present treatment and F/U evaluation   Patient to call [facility] Management Center should patient have concerns prior to scheduled return appointment  Greater trochanteric bursitis

## 2015-10-15 ENCOUNTER — Ambulatory Visit: Payer: Medicare Other | Admitting: Pain Medicine

## 2015-10-19 ENCOUNTER — Encounter: Payer: Self-pay | Admitting: Pain Medicine

## 2015-10-19 ENCOUNTER — Ambulatory Visit: Payer: Medicare Other | Attending: Pain Medicine | Admitting: Pain Medicine

## 2015-10-19 VITALS — BP 101/60 | HR 66 | Temp 99.4°F | Resp 16 | Ht 61.0 in | Wt 172.0 lb

## 2015-10-19 DIAGNOSIS — S92912G Unspecified fracture of left toe(s), subsequent encounter for fracture with delayed healing: Secondary | ICD-10-CM

## 2015-10-19 DIAGNOSIS — M533 Sacrococcygeal disorders, not elsewhere classified: Secondary | ICD-10-CM

## 2015-10-19 DIAGNOSIS — M5416 Radiculopathy, lumbar region: Secondary | ICD-10-CM

## 2015-10-19 DIAGNOSIS — M79605 Pain in left leg: Secondary | ICD-10-CM | POA: Diagnosis present

## 2015-10-19 DIAGNOSIS — M79604 Pain in right leg: Secondary | ICD-10-CM | POA: Diagnosis present

## 2015-10-19 DIAGNOSIS — G5602 Carpal tunnel syndrome, left upper limb: Secondary | ICD-10-CM

## 2015-10-19 DIAGNOSIS — M5136 Other intervertebral disc degeneration, lumbar region: Secondary | ICD-10-CM

## 2015-10-19 DIAGNOSIS — M5481 Occipital neuralgia: Secondary | ICD-10-CM

## 2015-10-19 DIAGNOSIS — G2581 Restless legs syndrome: Secondary | ICD-10-CM

## 2015-10-19 DIAGNOSIS — M4726 Other spondylosis with radiculopathy, lumbar region: Secondary | ICD-10-CM

## 2015-10-19 DIAGNOSIS — M160 Bilateral primary osteoarthritis of hip: Secondary | ICD-10-CM

## 2015-10-19 DIAGNOSIS — M47816 Spondylosis without myelopathy or radiculopathy, lumbar region: Secondary | ICD-10-CM

## 2015-10-19 MED ORDER — FENTANYL CITRATE (PF) 100 MCG/2ML IJ SOLN: 100.0000 ug | Freq: Once | INTRAMUSCULAR | Status: DC

## 2015-10-19 MED ORDER — ORPHENADRINE CITRATE 30 MG/ML IJ SOLN: 60.0000 mg | Freq: Once | INTRAMUSCULAR | Status: DC

## 2015-10-19 MED ORDER — MIDAZOLAM HCL 5 MG/5ML IJ SOLN: 5.0000 mg | Freq: Once | INTRAMUSCULAR | Status: DC

## 2015-10-19 MED ORDER — MIDAZOLAM HCL 5 MG/5ML IJ SOLN
INTRAMUSCULAR | Status: AC
  Administered 2015-10-19: 4 mg via INTRAVENOUS
  Filled 2015-10-19: qty 5

## 2015-10-19 MED ORDER — DOXYCYCLINE HYCLATE 100 MG PO TABS: 100.0000 mg | ORAL_TABLET | Freq: Two times a day (BID) | ORAL | Status: DC

## 2015-10-19 MED ORDER — FENTANYL CITRATE (PF) 100 MCG/2ML IJ SOLN
INTRAMUSCULAR | Status: AC
  Administered 2015-10-19: 100 ug via INTRAVENOUS
  Filled 2015-10-19: qty 2

## 2015-10-19 MED ORDER — LIDOCAINE HCL (PF) 1 % IJ SOLN: 10.0000 mL | Freq: Once | INTRAMUSCULAR | Status: DC

## 2015-10-19 MED ORDER — LACTATED RINGERS IV SOLN: 1000.0000 mL | INTRAVENOUS | Status: DC

## 2015-10-19 MED ORDER — BUPIVACAINE HCL (PF) 0.25 % IJ SOLN: 30.0000 mL | Freq: Once | INTRAMUSCULAR | Status: DC

## 2015-10-19 MED ORDER — BUPIVACAINE HCL (PF) 0.25 % IJ SOLN
INTRAMUSCULAR | Status: AC
  Administered 2015-10-19: 12:00:00
  Filled 2015-10-19: qty 30

## 2015-10-19 NOTE — Patient Instructions (Addendum)
PLAN   Continue present medication Zanaflex and oxycodone. Please get doxycycline antibiotic today and begin taking doxycycline antibiotic today as prescribed  F/U PCP Dr. Brynda Greathouse for evaliation of  BP and general medical  condition  F/U surgical evaluation. Follow-up with Dr. Elvina Mattes as planned for fracture of foot  F/U neurological evaluation. May consider pending follow-up evaluations  May consider radiofrequency rhizolysis or intraspinal procedures pending response to present treatment and F/U evaluation   Patient to call [facility] Management Center should patient have concerns prior to scheduled return appointment.Pain Management Discharge Instructions  General Discharge Instructions :  If you need to reach your doctor call: Monday-Friday 8:00 am - 4:00 pm at [phone] or toll free [phone].  After clinic hours [phone] to have operator reach doctor.  Bring all of your medication bottles to all your appointments in the pain clinic.  To cancel or reschedule your appointment with Pain Management please remember to call 24 hours in advance to avoid a fee.  Refer to the educational materials which you have been given on: General Risks, I had my Procedure. Discharge Instructions, Post Sedation.  Post Procedure Instructions:  The drugs you were given will stay in your system until tomorrow, so for the next 24 hours you should not drive, make any legal decisions or drink any alcoholic beverages.  You may eat anything you prefer, but it is better to start with liquids then soups and crackers, and gradually work up to solid foods.  Please notify your doctor immediately if you have any unusual bleeding, trouble breathing or pain that is not related to your normal pain.  Depending on the type of procedure that was done, some parts of your body may feel week and/or numb.  This usually clears up by tonight or the next day.  Walk with the use of an assistive device or accompanied by  an adult for the 24 hours.  You may use ice on the affected area for the first 24 hours.  Put ice in a Ziploc bag and cover with a towel and place against area 15 minutes on 15 minutes off.  You may switch to heat after 24 hours.Pain Management Discharge Instructions  General Discharge Instructions :  If you need to reach your doctor call: Monday-Friday 8:00 am - 4:00 pm at [phone] or toll free [phone].  After clinic hours [phone] to have operator reach doctor.  Bring all of your medication bottles to all your appointments in the pain clinic.  To cancel or reschedule your appointment with Pain Management please remember to call 24 hours in advance to avoid a fee.  Refer to the educational materials which you have been given on: General Risks, I had my Procedure. Discharge Instructions, Post Sedation.  Post Procedure Instructions:  The drugs you were given will stay in your system until tomorrow, so for the next 24 hours you should not drive, make any legal decisions or drink any alcoholic beverages.  You may eat anything you prefer, but it is better to start with liquids then soups and crackers, and gradually work up to solid foods.  Please notify your doctor immediately if you have any unusual bleeding, trouble breathing or pain that is not related to your normal pain.  Depending on the type of procedure that was done, some parts of your body may feel week and/or numb.  This usually clears up by tonight or the next day.  Walk with the use of an assistive device or accompanied by an  adult for the 24 hours.  You may use ice on the affected area for the first 24 hours.  Put ice in a Ziploc bag and cover with a towel and place against area 15 minutes on 15 minutes off.  You may switch to heat after 24 hours.Pain Management Discharge Instructions  General Discharge Instructions :  If you need to reach your doctor call: Monday-Friday 8:00 am - 4:00 pm at [phone] or toll  free [phone].  After clinic hours [phone] to have operator reach doctor.  Bring all of your medication bottles to all your appointments in the pain clinic.  To cancel or reschedule your appointment with Pain Management please remember to call 24 hours in advance to avoid a fee.  Refer to the educational materials which you have been given on: General Risks, I had my Procedure. Discharge Instructions, Post Sedation.  Post Procedure Instructions:  The drugs you were given will stay in your system until tomorrow, so for the next 24 hours you should not drive, make any legal decisions or drink any alcoholic beverages.  You may eat anything you prefer, but it is better to start with liquids then soups and crackers, and gradually work up to solid foods.  Please notify your doctor immediately if you have any unusual bleeding, trouble breathing or pain that is not related to your normal pain.  Depending on the type of procedure that was done, some parts of your body may feel week and/or numb.  This usually clears up by tonight or the next day.  Walk with the use of an assistive device or accompanied by an adult for the 24 hours.  You may use ice on the affected area for the first 24 hours.  Put ice in a Ziploc bag and cover with a towel and place against area 15 minutes on 15 minutes off.  You may switch to heat after 24 hours.

## 2015-10-19 NOTE — Progress Notes (Signed)
Safety precautions to be maintained throughout the outpatient stay will include: orient to surroundings, keep bed in low position, maintain call bell within reach at all times, provide assistance with transfer out of bed and ambulation.  

## 2015-10-19 NOTE — Progress Notes (Signed)
Subjective:    Patient ID: Janet Murray, female    DOB: 1971-04-09, 45 y.o.   MRN: [id number]  HPI  PROCEDURE PERFORMED: Lumbar sympathetic block.  HISTORY OF PRESENT ILLNESS: The patient is 45 y.o. female who returns to [facility] for further evaluation and treatment of pain involving the lower extremities. The patient is with history of Severe lower extremity pain with burning stinging throbbing pain of the lower extremity. The patient is with prior fracture of the left lower extremity and has had improvement of the left lower extremity pain following lumbar sympathetic blocks. The patient is with sharp shooting stabbing pains of the right lower extremity of severe degree at this time which interferes with activities of daily living to severe degree and interference of patient's ability to obtain restful sleep. . There is concern regarding the patient's pain being due to neuropathy . The risks, benefits, and expectations of the procedure were discussed and explained to the patient who was understanding and wished to proceed with interventional treatment as planned.   DESCRIPTION OF PROCEDURE: Lumbar sympathetic block with IV Versed, IV fentanyl conscious sedation, EKG, blood pressure, pulse, capnography, and pulse oximetry monitoring. The procedure was performed with the patient in prone position under fluoroscopic guidance.   NEEDLE PLACEMENT AT L2, right side lumbar sympathetic block: With the patient in prone position and oblique orientation of 20 degrees, Betadine prep and local anesthetic skin wheal of 1.5% lidocaine plain was prepared at the proposed needle entry site. Under fluoroscopic guidance with 20 degrees oblique orientation, the 22 -gauge needle was inserted at the lateral border of the L2 vertebral body on the right side.   NEEDLE PLACEMENT AT L3, right side lumbar sympathetic block: With the patient in the prone position and oblique orientation of 20 degrees, Betadine  prep and local anesthetic skin wheal of 1.5% lidocaine plain was prepared at the proposed needle entry site. Under fluoroscopic guidance with 20 degrees  oblique orientation, the 22 -gauge needle was inserted at the lateral border of the L3 vertebral body on the right side.   NEEDLE PLACEMENT AT L4, right side lumbar sympathetic block: With the patient in prone position and oblique orientation of 20 degrees, Betadine prep and local anesthetic skin wheal of 1.5% lidocaine plain was prepared at the proposed needle entry site. Under fluoroscopic guidance with 20 degrees  oblique orientation, the 22 -gauge needle was inserted at the lateral border of the L4 vertebral body on the right side.    Following needle placement at the L2, L3 and L4 vertebral body levels on the right side, needle placement was then verified on lateral view with tip of the needle documented to be in the anterior third of the vertebral body of L2, L3 and L4 respectively. Following negative aspiration of each needle for heme and CSF, L2 vertebral body level needle was injected with 10 mL of 0.25% bupivacaine. L3 vertebral body level needle was injected with 10 mL of 0.25% bupivacaine. L4 vertebral body level was injected with 10 mL of 0.25% bupivacaine. Needles were removed. Please note temperature readings prior to lumbar sympathetic block were noted to be 94 degrees Fahrenheit and following completion of the lumbar sympathetic block temperature readings of the lower extremity were noted to be 99 degrees Fahrenheit. The patient tolerated the procedure well.  PLAN:   1. Medications: We will continue presently prescribed medications Zanaflex and oxycodone at this time. 2. The patient is to follow-up with primary care physician Dr.  Eason for further evaluation of blood pressure and general medical condition as discussed. 3. Surgical evaluation as discussed.. Patient will follow-up with Dr. Elvina Mattes 4. Neurological evaluation as  discussed. 5. The patient may be candidate for radiofrequency procedures, implantation devices, and other treatment pending response to treatment and follow-up evaluation. 6. The patient has been advised to adhere to proper body mechanics. 7. The patient has been advised to call the Pain Management Center prior to scheduled return appointment should there be significant change in condition or have other concerns regarding condition prior to scheduled return appointment.   The patient was understanding and in agreement with suggested treatment plan.   Review of Systems     Objective:   Physical Exam        Assessment & Plan:

## 2015-10-22 ENCOUNTER — Telehealth: Payer: Self-pay | Admitting: *Deleted

## 2015-10-22 NOTE — Telephone Encounter (Signed)
Denies complications post procedure. 

## 2015-11-01 ENCOUNTER — Encounter: Payer: Self-pay | Admitting: Pain Medicine

## 2015-11-01 ENCOUNTER — Ambulatory Visit: Payer: Medicare Other | Attending: Pain Medicine | Admitting: Pain Medicine

## 2015-11-01 VITALS — BP 125/55 | HR 70 | Temp 98.0°F | Resp 16 | Ht 61.0 in | Wt 172.0 lb

## 2015-11-01 DIAGNOSIS — S92909A Unspecified fracture of unspecified foot, initial encounter for closed fracture: Secondary | ICD-10-CM | POA: Insufficient documentation

## 2015-11-01 DIAGNOSIS — X58XXXA Exposure to other specified factors, initial encounter: Secondary | ICD-10-CM | POA: Diagnosis not present

## 2015-11-01 DIAGNOSIS — M160 Bilateral primary osteoarthritis of hip: Secondary | ICD-10-CM

## 2015-11-01 DIAGNOSIS — M5481 Occipital neuralgia: Secondary | ICD-10-CM

## 2015-11-01 DIAGNOSIS — M47816 Spondylosis without myelopathy or radiculopathy, lumbar region: Secondary | ICD-10-CM

## 2015-11-01 DIAGNOSIS — M5136 Other intervertebral disc degeneration, lumbar region: Secondary | ICD-10-CM

## 2015-11-01 DIAGNOSIS — M79606 Pain in leg, unspecified: Secondary | ICD-10-CM | POA: Diagnosis present

## 2015-11-01 DIAGNOSIS — M4806 Spinal stenosis, lumbar region: Secondary | ICD-10-CM | POA: Diagnosis not present

## 2015-11-01 DIAGNOSIS — M542 Cervicalgia: Secondary | ICD-10-CM | POA: Diagnosis present

## 2015-11-01 DIAGNOSIS — G609 Hereditary and idiopathic neuropathy, unspecified: Secondary | ICD-10-CM

## 2015-11-01 DIAGNOSIS — M65822 Other synovitis and tenosynovitis, left upper arm: Secondary | ICD-10-CM | POA: Insufficient documentation

## 2015-11-01 DIAGNOSIS — M47896 Other spondylosis, lumbar region: Secondary | ICD-10-CM | POA: Insufficient documentation

## 2015-11-01 DIAGNOSIS — M5416 Radiculopathy, lumbar region: Secondary | ICD-10-CM

## 2015-11-01 DIAGNOSIS — M4726 Other spondylosis with radiculopathy, lumbar region: Secondary | ICD-10-CM

## 2015-11-01 DIAGNOSIS — G5602 Carpal tunnel syndrome, left upper limb: Secondary | ICD-10-CM

## 2015-11-01 DIAGNOSIS — M533 Sacrococcygeal disorders, not elsewhere classified: Secondary | ICD-10-CM

## 2015-11-01 DIAGNOSIS — R51 Headache: Secondary | ICD-10-CM | POA: Diagnosis present

## 2015-11-01 DIAGNOSIS — G5600 Carpal tunnel syndrome, unspecified upper limb: Secondary | ICD-10-CM | POA: Insufficient documentation

## 2015-11-01 DIAGNOSIS — G2581 Restless legs syndrome: Secondary | ICD-10-CM

## 2015-11-01 DIAGNOSIS — S92912G Unspecified fracture of left toe(s), subsequent encounter for fracture with delayed healing: Secondary | ICD-10-CM

## 2015-11-01 MED ORDER — TIZANIDINE HCL 2 MG PO CAPS: ORAL_CAPSULE | ORAL | Status: DC

## 2015-11-01 MED ORDER — OXYCODONE-ACETAMINOPHEN 5-325 MG PO TABS: ORAL_TABLET | ORAL | Status: DC

## 2015-11-01 NOTE — Patient Instructions (Addendum)
PLAN   Continue present medication Zanaflex and oxycodone.  Greater occipital nerve block to be performed at time of return appointment  F/U PCP Dr. Brynda Greathouse for evaliation of  BP headache and general medical  condition. Follow-up Dr. Brynda Greathouse regarding fractures of the foot as well as with surgeon as discussed . Also follow-up with Dr. Brynda Greathouse regarding increase of Topamax for your headache and we will proceed with bilateral occipital nerve blocks to treat headache at time of return appointment as discussed  F/U surgical evaluation. Patient will follow-up with Dr. Elvina Mattes as discussed  F/U neurological evaluation. May consider PNCV/EMG studies and other studies pending follow-up evaluations  May consider radiofrequency rhizolysis or intraspinal procedures pending response to present treatment and F/U evaluation   Patient to call [facility] should patient have concerns prior to scheduled return appointmentOccipital Nerve Block Patient Information  Description: The occipital nerves originate in the cervical (neck) spinal cord and travel upward through muscle and tissue to supply sensation to the back of the head and top of the scalp.  In addition, the nerves control some of the muscles of the scalp.  Occipital neuralgia is an irritation of these nerves which can cause headaches, numbness of the scalp, and neck discomfort.     The occipital nerve block will interrupt nerve transmission through these nerves and can relieve pain and spasm.  The block consists of insertion of a small needle under the skin in the back of the head to deposit local anesthetic (numbing medicine) and/or steroids around the nerve.  The entire block usually lasts less than 5 minutes.  Conditions which may be treated by occipital blocks:   Muscular pain and spasm of the scalp  Nerve irritation, back of the head  Headaches  Upper neck pain  Preparation for the injection:  1. Do not eat any solid food or dairy  products within 8 hours of your appointment. 2. You may drink clear liquids up to 3 hours before appointment.  Clear liquids include water, black coffee, juice or soda.  No milk or cream please. 3. You may take your regular medication, including pain medications, with a sip of water before you appointment.  Diabetics should hold regular insulin (if taken separately) and take 1/2 normal NPH dose the morning of the procedure.  Carry some sugar containing items with you to your appointment. 4. A driver must accompany you and be prepared to drive you home after your procedure. 5. Bring all your current medications with you. 6. An IV may be inserted and sedation may be given at the discretion of the physician. 7. A blood pressure cuff, EKG, and other monitors will often be applied during the procedure.  Some patients may need to have extra oxygen administered for a short period. 8. You will be asked to provide medical information, including your allergies and medications, prior to the procedure.  We must know immediately if you are taking blood thinners (like Coumadin/Warfarin) or if you are allergic to IV iodine contrast (dye).  We must know if you could possible be pregnant.  9. Do not wear a high collared shirt or turtleneck.  Tie long hair up in the back if possible.  Possible side-effects:   Bleeding from needle site  Infection (rare, may require surgery)  Nerve injury (rare)  Hair on back of neck can be tinged with iodine scrub (this will wash out)  Light-headedness (temporary)  Pain at injection site (several days)  Decreased blood pressure (rare, temporary)  Seizure (very rare)  Call if you experience:   Hives or difficulty breathing ( go to the emergency room)  Inflammation or drainage at the injection site(s)  Please note:  Although the local anesthetic injected can often make your painful muscles or headache feel good for several hours after the injection, the pain may  return.  It takes 3-7 days for steroids to work.  You may not notice any pain relief for at least one week.  If effective, we will often do a series of injections spaced 3-6 weeks apart to maximally decrease your pain.  If you have any questions, please call [phone] [place]  What are the risk, side effects and possible complications? Generally speaking, most procedures are safe.  However, with any procedure there are risks, side effects, and the possibility of complications.  The risks and complications are dependent upon the sites that are lesioned, or the type of nerve block to be performed.  The closer the procedure is to the spine, the more serious the risks are.  Great care is taken when placing the radio frequency needles, block needles or lesioning probes, but sometimes complications can occur. 1. Infection: Any time there is an injection through the skin, there is a risk of infection.  This is why sterile conditions are used for these blocks.  There are four possible types of infection. 1. Localized skin infection. 2. Central Nervous System Infection-This can be in the form of Meningitis, which can be deadly. 3. Epidural Infections-This can be in the form of an epidural abscess, which can cause pressure inside of the spine, causing compression of the spinal cord with subsequent paralysis. This would require an emergency surgery to decompress, and there are no guarantees that the patient would recover from the paralysis. 4. Discitis-This is an infection of the intervertebral discs.  It occurs in about 1% of discography procedures.  It is difficult to treat and it may lead to surgery.        2. Pain: the needles have to go through skin and soft tissues, will cause soreness.       3. Damage to internal structures:  The nerves to be lesioned may be near blood vessels or    other nerves which can be potentially  damaged.       4. Bleeding: Bleeding is more common if the patient is taking blood thinners such as  aspirin, Coumadin, Ticiid, Plavix, etc., or if he/she have some genetic predisposition  such as hemophilia. Bleeding into the spinal canal can cause compression of the spinal  cord with subsequent paralysis.  This would require an emergency surgery to  decompress and there are no guarantees that the patient would recover from the  paralysis.       5. Pneumothorax:  Puncturing of a lung is a possibility, every time a needle is introduced in  the area of the chest or upper back.  Pneumothorax refers to free air around the  collapsed lung(s), inside of the thoracic cavity (chest cavity).  Another two possible  complications related to a similar event would include: Hemothorax and Chylothorax.   These are variations of the Pneumothorax, where instead of air around the collapsed  lung(s), you may have blood or chyle, respectively.       6. Spinal headaches: They may occur with any procedures in the area of the spine.       7. Persistent CSF (Cerebro-Spinal  Fluid) leakage: This is a rare problem, but may occur  with prolonged intrathecal or epidural catheters either due to the formation of a fistulous  track or a dural tear.       8. Nerve damage: By working so close to the spinal cord, there is always a possibility of  nerve damage, which could be as serious as a permanent spinal cord injury with  paralysis.       9. Death:  Although rare, severe deadly allergic reactions known as "Anaphylactic  reaction" can occur to any of the medications used.      10. Worsening of the symptoms:  We can always make thing worse.  What are the chances of something like this happening? Chances of any of this occuring are extremely low.  By statistics, you have more of a chance of getting killed in a motor vehicle accident: while driving to the hospital than any of the above occurring .  Nevertheless, you should be aware that  they are possibilities.  In general, it is similar to taking a shower.  Everybody knows that you can slip, hit your head and get killed.  Does that mean that you should not shower again?  Nevertheless always keep in mind that statistics do not mean anything if you happen to be on the wrong side of them.  Even if a procedure has a 1 (one) in a 1,000,000 (million) chance of going wrong, it you happen to be that one..Also, keep in mind that by statistics, you have more of a chance of having something go wrong when taking medications.  Who should not have this procedure? If you are on a blood thinning medication (e.g. Coumadin, Plavix, see list of "Blood Thinners"), or if you have an active infection going on, you should not have the procedure.  If you are taking any blood thinners, please inform your physician.  How should I prepare for this procedure?  Do not eat or drink anything at least six hours prior to the procedure.  Bring a driver with you .  It cannot be a taxi.  Come accompanied by an adult that can drive you back, and that is strong enough to help you if your legs get weak or numb from the local anesthetic.  Take all of your medicines the morning of the procedure with just enough water to swallow them.  If you have diabetes, make sure that you are scheduled to have your procedure done first thing in the morning, whenever possible.  If you have diabetes, take only half of your insulin dose and notify our nurse that you have done so as soon as you arrive at the clinic.  If you are diabetic, but only take blood sugar pills (oral hypoglycemic), then do not take them on the morning of your procedure.  You may take them after you have had the procedure.  Do not take aspirin or any aspirin-containing medications, at least eleven (11) days prior to the procedure.  They may prolong bleeding.  Wear loose fitting clothing that may be easy to take off and that you would not mind if it got stained  with Betadine or blood.  Do not wear any jewelry or perfume  Remove any nail coloring.  It will interfere with some of our monitoring equipment.  NOTE: Remember that this is not meant to be interpreted as a complete list of all possible complications.  Unforeseen problems may occur.  BLOOD THINNERS The following drugs contain  aspirin or other products, which can cause increased bleeding during surgery and should not be taken for 2 weeks prior to and 1 week after surgery.  If you should need take something for relief of minor pain, you may take acetaminophen which is found in Tylenol,m Datril, Anacin-3 and Panadol. It is not blood thinner. The products listed below are.  Do not take any of the products listed below in addition to any listed on your instruction sheet.  A.P.C or A.P.C with Codeine Codeine Phosphate Capsules #3 Ibuprofen Ridaura  ABC compound Congesprin Imuran rimadil  Advil Cope Indocin Robaxisal  Alka-Seltzer Effervescent Pain Reliever and Antacid Coricidin or Coricidin-D  Indomethacin Rufen  Alka-Seltzer plus Cold Medicine Cosprin Ketoprofen S-A-C Tablets  Anacin Analgesic Tablets or Capsules Coumadin Korlgesic Salflex  Anacin Extra Strength Analgesic tablets or capsules CP-2 Tablets Lanoril Salicylate  Anaprox Cuprimine Capsules Levenox Salocol  Anexsia-D Dalteparin Magan Salsalate  Anodynos Darvon compound Magnesium Salicylate Sine-off  Ansaid Dasin Capsules Magsal Sodium Salicylate  Anturane Depen Capsules Marnal Soma  APF Arthritis pain formula Dewitt's Pills Measurin Stanback  Argesic Dia-Gesic Meclofenamic Sulfinpyrazone  Arthritis Bayer Timed Release Aspirin Diclofenac Meclomen Sulindac  Arthritis pain formula Anacin Dicumarol Medipren Supac  Analgesic (Safety coated) Arthralgen Diffunasal Mefanamic Suprofen  Arthritis Strength Bufferin Dihydrocodeine Mepro Compound Suprol  Arthropan liquid Dopirydamole Methcarbomol with Aspirin Synalgos  ASA tablets/Enseals  Disalcid Micrainin Tagament  Ascriptin Doan's Midol Talwin  Ascriptin A/D Dolene Mobidin Tanderil  Ascriptin Extra Strength Dolobid Moblgesic Ticlid  Ascriptin with Codeine Doloprin or Doloprin with Codeine Momentum Tolectin  Asperbuf Duoprin Mono-gesic Trendar  Aspergum Duradyne Motrin or Motrin IB Triminicin  Aspirin plain, buffered or enteric coated Durasal Myochrisine Trigesic  Aspirin Suppositories Easprin Nalfon Trillsate  Aspirin with Codeine Ecotrin Regular or Extra Strength Naprosyn Uracel  Atromid-S Efficin Naproxen Ursinus  Auranofin Capsules Elmiron Neocylate Vanquish  Axotal Emagrin Norgesic Verin  Azathioprine Empirin or Empirin with Codeine Normiflo Vitamin E  Azolid Emprazil Nuprin Voltaren  Bayer Aspirin plain, buffered or children's or timed BC Tablets or powders Encaprin Orgaran Warfarin Sodium  Buff-a-Comp Enoxaparin Orudis Zorpin  Buff-a-Comp with Codeine Equegesic Os-Cal-Gesic   Buffaprin Excedrin plain, buffered or Extra Strength Oxalid   Bufferin Arthritis Strength Feldene Oxphenbutazone   Bufferin plain or Extra Strength Feldene Capsules Oxycodone with Aspirin   Bufferin with Codeine Fenoprofen Fenoprofen Pabalate or Pabalate-SF   Buffets II Flogesic Panagesic   Buffinol plain or Extra Strength Florinal or Florinal with Codeine Panwarfarin   Buf-Tabs Flurbiprofen Penicillamine   Butalbital Compound Four-way cold tablets Penicillin   Butazolidin Fragmin Pepto-Bismol   Carbenicillin Geminisyn Percodan   Carna Arthritis Reliever Geopen Persantine   Carprofen Gold's salt Persistin   Chloramphenicol Goody's Phenylbutazone   Chloromycetin Haltrain Piroxlcam   Clmetidine heparin Plaquenil   Cllnoril Hyco-pap Ponstel   Clofibrate Hydroxy chloroquine Propoxyphen         Before stopping any of these medications, be sure to consult the physician who ordered them.  Some, such as Coumadin (Warfarin) are ordered to prevent or treat serious conditions such as "deep  thrombosis", "pumonary embolisms", and other heart problems.  The amount of time that you may need off of the medication may also vary with the medication and the reason for which you were taking it.  If you are taking any of these medications, please make sure you notify your pain physician before you undergo any procedures.

## 2015-11-01 NOTE — Progress Notes (Signed)
Safety precautions to be maintained throughout the outpatient stay will include: orient to surroundings, keep bed in low position, maintain call bell within reach at all times, provide assistance with transfer out of bed and ambulation.  

## 2015-11-02 NOTE — Progress Notes (Signed)
Subjective:    Patient ID: Janet Murray, female    DOB: 1970/09/08, 45 y.o.   MRN: [id number]  HPI  The patient is a 45 year old female who returns to pain management for further evaluation and treatment of pain involving the neck with as well as headaches in pain involving the lower back and lower extremity regions. The patient is undergone treatment of pain involving the left lower extremity following fracture of the lower extremity with significant decrease in pain of the lower extremity. The patient has had some pain involving the right lower extremity as well and states that the most bothersome symptom at this time involves headache. The patient has recently been evaluated by her primary care physician Dr. Soyla Dryer who informed patient to increase her Topamax. The patient states that headaches have been quite severe and incapacitating and patient wishes to proceed with interventional treatment at time return appointment in attempt to decrease severity of headaches. The patient states the pain involves the region of the neck to the back of the head and radiates toward the region behind the eyes. The patient denies any trauma change in events of daily living the call significant change in symptomatology and states that she is in hopes of being able to undergo interventional treatment to decrease severity of the headaches at time return appointment. We will continue present medications and patient is to continue the use of Topamax as discussed with the patient and we will consider additional modifications of treatment pending follow-up evaluation. All agreed to suggested treatment plan  Review of Systems     Objective:   Physical Exam  There was moderate to moderately severe tends to palpation of the splenius capitis and occipitalis musculature regions. There was tenderness over the region of the cervical facet cervical paraspinal musculature region of moderate degree. There were no bounding  pulsations of the temporal region noted. Palpation of the temporomandibular joint region was with mild tenderness to palpation. Predominant portion of patient's pain was reproduced with palpation of the splenius capitis and occipitalis regions palpation which reproduces moderately severe pain. Palpation of the acromioclavicular and glenohumeral joint regions were without increased pain of significant degree and patient appeared to be with decreased grip strength on the left compared to the right without a definite increase of pain with Tinel and Phalen's maneuver was noted. There was tenderness of the acromioclavicular and glenohumeral joint regions of mild degree with unremarkable Spurling's maneuver. No crepitus of the thoracic region was noted. Palpation over the lumbar paraspinal must reason lumbar facet region was attends to palpation of moderate degree with lateral bending rotation extension and palpation of the lumbar facets reproducing moderate discomfort. There was decreased EHL strength of the lower extremities especially the left lower extremity with no sensory deficit dermatomal distribution detected. DTRs were difficult to elicit and patient had difficulty relaxing. Palpation of the PSIS and PII S region was attends to palpation of mild to moderate degree and mild to moderate tenderness of the greater trochanteric region iliotibial band region. There was negative Homans. Abdomen was nontender and no costovertebral tenderness noted.      Assessment & Plan:   Bilateral occipital neuralgia  Fracture of left foot (concern regarding changes suggestive of early complex regional pain syndrome)  L4-L5 level degenerative changes noted on prior MRI  Lumbar stenosis with neurogenic claudication  Lumbar facet syndrome  Lumbar radiculopathy  Carpal tunnel syndrome  Tenosynovitis of the left upper extremity      PLAN  Continue present medication Zanaflex and oxycodone.  Greater  occipital nerve block to be performed at time of return appointment  F/U PCP Dr. Brynda Greathouse for evaliation of  BP headache and general medical  condition. Follow-up Dr. Brynda Greathouse regarding fractures of the foot as well as with surgeon as discussed . Also follow-up with Dr. Brynda Greathouse regarding increase of Topamax for your headache and we will proceed with bilateral occipital nerve blocks to treat headache at time of return appointment as discussed  F/U surgical evaluation. Patient will follow-up with Dr. Elvina Mattes as discussed  F/U neurological evaluation. May consider PNCV/EMG studies and other studies pending follow-up evaluations  May consider radiofrequency rhizolysis or intraspinal procedures pending response to present treatment and F/U evaluation   Patient to call [facility] should patient have concerns prior to scheduled return appointment

## 2015-11-07 LAB — TOXASSURE SELECT 13 (MW), URINE

## 2015-11-07 NOTE — Progress Notes (Signed)
Quick Note:  Reviewed. ______ 

## 2015-11-14 ENCOUNTER — Encounter: Payer: Self-pay | Admitting: Pain Medicine

## 2015-11-14 ENCOUNTER — Other Ambulatory Visit: Payer: Self-pay | Admitting: Pain Medicine

## 2015-11-14 ENCOUNTER — Ambulatory Visit: Payer: Medicare Other | Attending: Pain Medicine | Admitting: Pain Medicine

## 2015-11-14 VITALS — BP 105/65 | HR 62 | Temp 97.9°F | Resp 16 | Ht 61.0 in | Wt 172.0 lb

## 2015-11-14 DIAGNOSIS — R51 Headache: Secondary | ICD-10-CM | POA: Diagnosis present

## 2015-11-14 DIAGNOSIS — M533 Sacrococcygeal disorders, not elsewhere classified: Secondary | ICD-10-CM

## 2015-11-14 DIAGNOSIS — M160 Bilateral primary osteoarthritis of hip: Secondary | ICD-10-CM

## 2015-11-14 DIAGNOSIS — M5481 Occipital neuralgia: Secondary | ICD-10-CM

## 2015-11-14 DIAGNOSIS — M4726 Other spondylosis with radiculopathy, lumbar region: Secondary | ICD-10-CM

## 2015-11-14 DIAGNOSIS — M542 Cervicalgia: Secondary | ICD-10-CM | POA: Diagnosis present

## 2015-11-14 DIAGNOSIS — G609 Hereditary and idiopathic neuropathy, unspecified: Secondary | ICD-10-CM

## 2015-11-14 DIAGNOSIS — M47816 Spondylosis without myelopathy or radiculopathy, lumbar region: Secondary | ICD-10-CM

## 2015-11-14 DIAGNOSIS — G5602 Carpal tunnel syndrome, left upper limb: Secondary | ICD-10-CM

## 2015-11-14 DIAGNOSIS — M5136 Other intervertebral disc degeneration, lumbar region: Secondary | ICD-10-CM

## 2015-11-14 DIAGNOSIS — S92912G Unspecified fracture of left toe(s), subsequent encounter for fracture with delayed healing: Secondary | ICD-10-CM

## 2015-11-14 DIAGNOSIS — G2581 Restless legs syndrome: Secondary | ICD-10-CM

## 2015-11-14 DIAGNOSIS — M5416 Radiculopathy, lumbar region: Secondary | ICD-10-CM

## 2015-11-14 MED ORDER — MIDAZOLAM HCL 5 MG/5ML IJ SOLN
5.0000 mg | Freq: Once | INTRAMUSCULAR | Status: AC
Start: 2015-11-14 — End: 2015-11-14
  Administered 2015-11-14: 3 mg via INTRAVENOUS
  Filled 2015-11-14: qty 5

## 2015-11-14 MED ORDER — TRIAMCINOLONE ACETONIDE 40 MG/ML IJ SUSP
40.0000 mg | Freq: Once | INTRAMUSCULAR | Status: AC
  Administered 2015-11-14: 40 mg
  Filled 2015-11-14: qty 1

## 2015-11-14 MED ORDER — LACTATED RINGERS IV SOLN: 1000.0000 mL | INTRAVENOUS | Status: DC

## 2015-11-14 MED ORDER — BUPIVACAINE HCL (PF) 0.25 % IJ SOLN
30.0000 mL | Freq: Once | INTRAMUSCULAR | Status: AC
  Administered 2015-11-14: 30 mL
  Filled 2015-11-14: qty 30

## 2015-11-14 MED ORDER — FENTANYL CITRATE (PF) 100 MCG/2ML IJ SOLN
100.0000 ug | Freq: Once | INTRAMUSCULAR | Status: AC
  Administered 2015-11-14: 100 ug via INTRAVENOUS
  Filled 2015-11-14: qty 2

## 2015-11-14 MED ORDER — ORPHENADRINE CITRATE 30 MG/ML IJ SOLN
60.0000 mg | Freq: Once | INTRAMUSCULAR | Status: AC
  Administered 2015-11-14: 60 mg via INTRAMUSCULAR
  Filled 2015-11-14: qty 2

## 2015-11-14 NOTE — Progress Notes (Signed)
Subjective:    Patient ID: Janet Murray, female    DOB: [birth year], 45 y.o.   MRN: [id number]  HPI                                     BILATERAL OCCIPITAL NERVE BLOCKS  NOTE: The patient is a 45 y.o.-year-old female who returns to the [facility] for further evaluation and treatment of pain consisting of pain involving the region of the neck and headache.  Patient is with History of headaches with pain radiating from the neck to the back of the head and continuing to the retro-orbital region. There is concern regarding significant component of patient's headaches been due to bilateral occipital neuralgia. The patient also has a component of headaches due to migraine..At the present time we will proceed with bilateral occipital nerve blocks in attempt to decrease severity of symptoms, minimize progression of symptoms, and avoid the need for more involved treatment.   The risks, benefits, and expectations of the procedure have been discussed and explained to patient, who is understanding and wishes to proceed with interventional treatment as discussed and as explained to patient.  Will proceed with greater occipital nerve blocks with myoneural block injections at this time as discussed and as explained to patient.  All are understanding and in agreement with suggested treatment plan.    PROCEDURE:  Greater occipital nerve block on the  left side with IV Versed, IV Fentanyl, conscious sedation, EKG, blood pressure, pulse, capnography, and pulse oximetry monitoring.  Procedure performed with patient in prone position.  Greater occipital nerve block on the left side.   With patient in prone position, Betadine prep of proposed entry site accomplished.  Following identification of the nuchal ridge, 22 -gauge needle was inserted at the level of the nuchal ridge medial to the occipital artery.  Following negative aspiration, 4cc 0.25% bupivacaine with Kenalog injected for left greater occipital  nerve block.  Needle was removed.  Patient tolerated injection well.   Greater occipital nerve block on the rightt side. The greater occipital nerve block on the right side was performed exactly as the left greater occipital nerve block was performed and utilizing the same technique.  Myoneural block injection of the lumbar paraspinal musculature region Following Betadine prep of proposed entry site a 22-gauge needle was inserted into the lumbar paraspinal musculature region and following negative aspiration 2 cc of 0.25% bupivacaine with Norflex was injected for myoneural block injection of the lumbar paraspinal musculature region times two.  The patient tolerated procedure well   A total of 10 mg Kenalog was utilized for the entire procedure.  PLAN:    1. Medications: Will continue presently prescribed medications Zanaflex oxycodone and Topamax  at this time. 2. Patient to follow up with primary care physician  Dr. Delena Serve evaluation of blood pressure and general medical condition status post procedure performed on today's visit.. The patient will also discussed Topamax dose with Dr. Brynda Greathouse as well 3. Neurological evaluation for further assessment of headaches and for other studies as discussed. 4. Surgical evaluation as discussed.  5. Patient may be candidate for Botox injections, radiofrequency procedures, as well as implantation type procedures pending response to treatment rendered on today's visit and pending follow-up evaluation. 6. Patient has been advised to adhere to proper body mechanics and to avoid activities which appear to aggravate condition.cations:  Will continue presently prescribed medications at  this time. 7. The patient is understanding and in agreement with the suggested treatment plan.   Review of Systems     Objective:   Physical Exam        Assessment & Plan:

## 2015-11-14 NOTE — Progress Notes (Signed)
Patient here for procedure d/t miagraines.  Also lower back pain.   Safety precautions to be maintained throughout the outpatient stay will include: orient to surroundings, keep bed in low position, maintain call bell within reach at all times, provide assistance with transfer out of bed and ambulation.

## 2015-11-14 NOTE — Patient Instructions (Addendum)
PLAN   Continue present medication Zanaflex and oxycodone.  F/U PCP Dr. Brynda Greathouse for evaliation of  BP headache and general medical  condition. Follow-up Dr. Brynda Greathouse regarding fractures of the foot as well as with surgeon as discussed . Also follow-up with Dr. Brynda Greathouse regarding increase of Topamax for your headache   F/U surgical evaluation. Patient will follow-up with Dr. Elvina Mattes as discussed  F/U neurological evaluation. May consider PNCV/EMG studies and other studies pending follow-up evaluations  May consider radiofrequency rhizolysis or intraspinal procedures pending response to present treatment and F/U evaluation   Patient to call Pain Management Center should patient have concerns prior to scheduled return appointmentPain Management Discharge Instructions  General Discharge Instructions :  If you need to reach your doctor call: Monday-Friday 8:00 am - 4:00 pm at [phone] or toll free [phone].  After clinic hours [phone] to have operator reach doctor.  Bring all of your medication bottles to all your appointments in the pain clinic.  To cancel or reschedule your appointment with Pain Management please remember to call 24 hours in advance to avoid a fee.  Refer to the educational materials which you have been given on: General Risks, I had my Procedure. Discharge Instructions, Post Sedation.  Post Procedure Instructions:  The drugs you were given will stay in your system until tomorrow, so for the next 24 hours you should not drive, make any legal decisions or drink any alcoholic beverages.  You may eat anything you prefer, but it is better to start with liquids then soups and crackers, and gradually work up to solid foods.  Please notify your doctor immediately if you have any unusual bleeding, trouble breathing or pain that is not related to your normal pain.  Depending on the type of procedure that was done, some parts of your body may feel week and/or numb.  This  usually clears up by tonight or the next day.  Walk with the use of an assistive device or accompanied by an adult for the 24 hours.  You may use ice on the affected area for the first 24 hours.  Put ice in a Ziploc bag and cover with a towel and place against area 15 minutes on 15 minutes off.  You may switch to heat after 24 hours.GENERAL RISKS AND COMPLICATIONS  What are the risk, side effects and possible complications? Generally speaking, most procedures are safe.  However, with any procedure there are risks, side effects, and the possibility of complications.  The risks and complications are dependent upon the sites that are lesioned, or the type of nerve block to be performed.  The closer the procedure is to the spine, the more serious the risks are.  Great care is taken when placing the radio frequency needles, block needles or lesioning probes, but sometimes complications can occur. 1. Infection: Any time there is an injection through the skin, there is a risk of infection.  This is why sterile conditions are used for these blocks.  There are four possible types of infection. 1. Localized skin infection. 2. Central Nervous System Infection-This can be in the form of Meningitis, which can be deadly. 3. Epidural Infections-This can be in the form of an epidural abscess, which can cause pressure inside of the spine, causing compression of the spinal cord with subsequent paralysis. This would require an emergency surgery to decompress, and there are no guarantees that the patient would recover from the paralysis. 4. Discitis-This is an infection of the intervertebral discs.  It occurs in about 1% of discography procedures.  It is difficult to treat and it may lead to surgery.        2. Pain: the needles have to go through skin and soft tissues, will cause soreness.       3. Damage to internal structures:  The nerves to be lesioned may be near blood vessels or    other nerves which can be  potentially damaged.       4. Bleeding: Bleeding is more common if the patient is taking blood thinners such as  aspirin, Coumadin, Ticiid, Plavix, etc., or if he/she have some genetic predisposition  such as hemophilia. Bleeding into the spinal canal can cause compression of the spinal  cord with subsequent paralysis.  This would require an emergency surgery to  decompress and there are no guarantees that the patient would recover from the  paralysis.       5. Pneumothorax:  Puncturing of a lung is a possibility, every time a needle is introduced in  the area of the chest or upper back.  Pneumothorax refers to free air around the  collapsed lung(s), inside of the thoracic cavity (chest cavity).  Another two possible  complications related to a similar event would include: Hemothorax and Chylothorax.   These are variations of the Pneumothorax, where instead of air around the collapsed  lung(s), you may have blood or chyle, respectively.       6. Spinal headaches: They may occur with any procedures in the area of the spine.       7. Persistent CSF (Cerebro-Spinal Fluid) leakage: This is a rare problem, but may occur  with prolonged intrathecal or epidural catheters either due to the formation of a fistulous  track or a dural tear.       8. Nerve damage: By working so close to the spinal cord, there is always a possibility of  nerve damage, which could be as serious as a permanent spinal cord injury with  paralysis.       9. Death:  Although rare, severe deadly allergic reactions known as "Anaphylactic  reaction" can occur to any of the medications used.      10. Worsening of the symptoms:  We can always make thing worse.  What are the chances of something like this happening? Chances of any of this occuring are extremely low.  By statistics, you have more of a chance of getting killed in a motor vehicle accident: while driving to the hospital than any of the above occurring .  Nevertheless, you should be  aware that they are possibilities.  In general, it is similar to taking a shower.  Everybody knows that you can slip, hit your head and get killed.  Does that mean that you should not shower again?  Nevertheless always keep in mind that statistics do not mean anything if you happen to be on the wrong side of them.  Even if a procedure has a 1 (one) in a 1,000,000 (million) chance of going wrong, it you happen to be that one..Also, keep in mind that by statistics, you have more of a chance of having something go wrong when taking medications.  Who should not have this procedure? If you are on a blood thinning medication (e.g. Coumadin, Plavix, see list of "Blood Thinners"), or if you have an active infection going on, you should not have the procedure.  If you are taking any blood thinners, please inform your physician.  How should I prepare for this procedure?  Do not eat or drink anything at least six hours prior to the procedure.  Bring a driver with you .  It cannot be a taxi.  Come accompanied by an adult that can drive you back, and that is strong enough to help you if your legs get weak or numb from the local anesthetic.  Take all of your medicines the morning of the procedure with just enough water to swallow them.  If you have diabetes, make sure that you are scheduled to have your procedure done first thing in the morning, whenever possible.  If you have diabetes, take only half of your insulin dose and notify our nurse that you have done so as soon as you arrive at the clinic.  If you are diabetic, but only take blood sugar pills (oral hypoglycemic), then do not take them on the morning of your procedure.  You may take them after you have had the procedure.  Do not take aspirin or any aspirin-containing medications, at least eleven (11) days prior to the procedure.  They may prolong bleeding.  Wear loose fitting clothing that may be easy to take off and that you would not mind if it  got stained with Betadine or blood.  Do not wear any jewelry or perfume  Remove any nail coloring.  It will interfere with some of our monitoring equipment.  NOTE: Remember that this is not meant to be interpreted as a complete list of all possible complications.  Unforeseen problems may occur.  BLOOD THINNERS The following drugs contain aspirin or other products, which can cause increased bleeding during surgery and should not be taken for 2 weeks prior to and 1 week after surgery.  If you should need take something for relief of minor pain, you may take acetaminophen which is found in Tylenol,m Datril, Anacin-3 and Panadol. It is not blood thinner. The products listed below are.  Do not take any of the products listed below in addition to any listed on your instruction sheet.  A.P.C or A.P.C with Codeine Codeine Phosphate Capsules #3 Ibuprofen Ridaura  ABC compound Congesprin Imuran rimadil  Advil Cope Indocin Robaxisal  Alka-Seltzer Effervescent Pain Reliever and Antacid Coricidin or Coricidin-D  Indomethacin Rufen  Alka-Seltzer plus Cold Medicine Cosprin Ketoprofen S-A-C Tablets  Anacin Analgesic Tablets or Capsules Coumadin Korlgesic Salflex  Anacin Extra Strength Analgesic tablets or capsules CP-2 Tablets Lanoril Salicylate  Anaprox Cuprimine Capsules Levenox Salocol  Anexsia-D Dalteparin Magan Salsalate  Anodynos Darvon compound Magnesium Salicylate Sine-off  Ansaid Dasin Capsules Magsal Sodium Salicylate  Anturane Depen Capsules Marnal Soma  APF Arthritis pain formula Dewitt's Pills Measurin Stanback  Argesic Dia-Gesic Meclofenamic Sulfinpyrazone  Arthritis Bayer Timed Release Aspirin Diclofenac Meclomen Sulindac  Arthritis pain formula Anacin Dicumarol Medipren Supac  Analgesic (Safety coated) Arthralgen Diffunasal Mefanamic Suprofen  Arthritis Strength Bufferin Dihydrocodeine Mepro Compound Suprol  Arthropan liquid Dopirydamole Methcarbomol with Aspirin Synalgos  ASA  tablets/Enseals Disalcid Micrainin Tagament  Ascriptin Doan's Midol Talwin  Ascriptin A/D Dolene Mobidin Tanderil  Ascriptin Extra Strength Dolobid Moblgesic Ticlid  Ascriptin with Codeine Doloprin or Doloprin with Codeine Momentum Tolectin  Asperbuf Duoprin Mono-gesic Trendar  Aspergum Duradyne Motrin or Motrin IB Triminicin  Aspirin plain, buffered or enteric coated Durasal Myochrisine Trigesic  Aspirin Suppositories Easprin Nalfon Trillsate  Aspirin with Codeine Ecotrin Regular or Extra Strength Naprosyn Uracel  Atromid-S Efficin Naproxen Ursinus  Auranofin Capsules Elmiron Neocylate Vanquish  Axotal Emagrin Norgesic Verin  Azathioprine  Empirin or Empirin with Codeine Normiflo Vitamin E  Azolid Emprazil Nuprin Voltaren  Bayer Aspirin plain, buffered or children's or timed BC Tablets or powders Encaprin Orgaran Warfarin Sodium  Buff-a-Comp Enoxaparin Orudis Zorpin  Buff-a-Comp with Codeine Equegesic Os-Cal-Gesic   Buffaprin Excedrin plain, buffered or Extra Strength Oxalid   Bufferin Arthritis Strength Feldene Oxphenbutazone   Bufferin plain or Extra Strength Feldene Capsules Oxycodone with Aspirin   Bufferin with Codeine Fenoprofen Fenoprofen Pabalate or Pabalate-SF   Buffets II Flogesic Panagesic   Buffinol plain or Extra Strength Florinal or Florinal with Codeine Panwarfarin   Buf-Tabs Flurbiprofen Penicillamine   Butalbital Compound Four-way cold tablets Penicillin   Butazolidin Fragmin Pepto-Bismol   Carbenicillin Geminisyn Percodan   Carna Arthritis Reliever Geopen Persantine   Carprofen Gold's salt Persistin   Chloramphenicol Goody's Phenylbutazone   Chloromycetin Haltrain Piroxlcam   Clmetidine heparin Plaquenil   Cllnoril Hyco-pap Ponstel   Clofibrate Hydroxy chloroquine Propoxyphen         Before stopping any of these medications, be sure to consult the physician who ordered them.  Some, such as Coumadin (Warfarin) are ordered to prevent or treat serious conditions  such as "deep thrombosis", "pumonary embolisms", and other heart problems.  The amount of time that you may need off of the medication may also vary with the medication and the reason for which you were taking it.  If you are taking any of these medications, please make sure you notify your pain physician before you undergo any procedures.

## 2015-11-15 ENCOUNTER — Telehealth: Payer: Self-pay | Admitting: *Deleted

## 2015-11-15 NOTE — Telephone Encounter (Signed)
Voicemail left for patient to call our office if there are questions or concerns re; procedure on yesterday.  

## 2015-12-03 ENCOUNTER — Encounter: Payer: Self-pay | Admitting: Pain Medicine

## 2015-12-03 ENCOUNTER — Ambulatory Visit: Payer: Medicare Other | Admitting: Pain Medicine

## 2015-12-03 ENCOUNTER — Ambulatory Visit: Payer: Medicare Other | Attending: Pain Medicine | Admitting: Pain Medicine

## 2015-12-03 VITALS — BP 114/66 | HR 72 | Temp 98.0°F | Resp 18 | Ht 61.0 in | Wt 172.0 lb

## 2015-12-03 DIAGNOSIS — G43909 Migraine, unspecified, not intractable, without status migrainosus: Secondary | ICD-10-CM | POA: Insufficient documentation

## 2015-12-03 DIAGNOSIS — G609 Hereditary and idiopathic neuropathy, unspecified: Secondary | ICD-10-CM

## 2015-12-03 DIAGNOSIS — M4726 Other spondylosis with radiculopathy, lumbar region: Secondary | ICD-10-CM

## 2015-12-03 DIAGNOSIS — M533 Sacrococcygeal disorders, not elsewhere classified: Secondary | ICD-10-CM

## 2015-12-03 DIAGNOSIS — G5600 Carpal tunnel syndrome, unspecified upper limb: Secondary | ICD-10-CM | POA: Insufficient documentation

## 2015-12-03 DIAGNOSIS — M65822 Other synovitis and tenosynovitis, left upper arm: Secondary | ICD-10-CM | POA: Diagnosis not present

## 2015-12-03 DIAGNOSIS — Z8781 Personal history of (healed) traumatic fracture: Secondary | ICD-10-CM | POA: Diagnosis not present

## 2015-12-03 DIAGNOSIS — M47816 Spondylosis without myelopathy or radiculopathy, lumbar region: Secondary | ICD-10-CM

## 2015-12-03 DIAGNOSIS — M4806 Spinal stenosis, lumbar region: Secondary | ICD-10-CM | POA: Insufficient documentation

## 2015-12-03 DIAGNOSIS — M5481 Occipital neuralgia: Secondary | ICD-10-CM

## 2015-12-03 DIAGNOSIS — G2581 Restless legs syndrome: Secondary | ICD-10-CM

## 2015-12-03 DIAGNOSIS — M5416 Radiculopathy, lumbar region: Secondary | ICD-10-CM

## 2015-12-03 DIAGNOSIS — M51369 Other intervertebral disc degeneration, lumbar region without mention of lumbar back pain or lower extremity pain: Secondary | ICD-10-CM

## 2015-12-03 DIAGNOSIS — M47896 Other spondylosis, lumbar region: Secondary | ICD-10-CM | POA: Insufficient documentation

## 2015-12-03 DIAGNOSIS — G43109 Migraine with aura, not intractable, without status migrainosus: Secondary | ICD-10-CM

## 2015-12-03 DIAGNOSIS — M5136 Other intervertebral disc degeneration, lumbar region: Secondary | ICD-10-CM

## 2015-12-03 DIAGNOSIS — M160 Bilateral primary osteoarthritis of hip: Secondary | ICD-10-CM

## 2015-12-03 DIAGNOSIS — M542 Cervicalgia: Secondary | ICD-10-CM | POA: Diagnosis present

## 2015-12-03 DIAGNOSIS — R51 Headache: Secondary | ICD-10-CM | POA: Diagnosis present

## 2015-12-03 DIAGNOSIS — S92912G Unspecified fracture of left toe(s), subsequent encounter for fracture with delayed healing: Secondary | ICD-10-CM

## 2015-12-03 DIAGNOSIS — G5602 Carpal tunnel syndrome, left upper limb: Secondary | ICD-10-CM

## 2015-12-03 MED ORDER — OXYCODONE-ACETAMINOPHEN 5-325 MG PO TABS: ORAL_TABLET | ORAL | Status: DC

## 2015-12-03 MED ORDER — TIZANIDINE HCL 2 MG PO CAPS
ORAL_CAPSULE | ORAL | Status: DC
Start: 2015-12-03 — End: 2016-01-01

## 2015-12-03 MED ORDER — DOXYCYCLINE HYCLATE 100 MG PO TABS: 100.0000 mg | ORAL_TABLET | Freq: Two times a day (BID) | ORAL | Status: DC

## 2015-12-03 NOTE — Progress Notes (Signed)
Subjective:    Patient ID: Janet Murray, female    DOB: 1970/09/26, 45 y.o.   MRN: [id number]  HPI  The patient is a 45 year old female who returns to pain management for further evaluation and treatment of pain involving the region of the neck with pain in the neck radiating to the back of the head precipitating headaches and with migraine headaches as well. The patient denies any recent trauma change in events of daily living the call significant change in symptomatology. The patient states that she has had some improvement of her pain of significant degree with prior interventional treatment. We will proceed with stellate ganglion block at time return appointment in attempt to decrease the frequency of headaches as well as the intensity of headaches and avoid progression of patient's symptoms. We discussed patient's medications and we will continue medications as presently prescribed. We will proceed with stellate ganglion block at time return appointment as discussed and will consider additional modifications of treatment regimen and interventional treatment pending follow-up evaluation and response to present treatment plan. There is concern regarding that a significant component of patient's pain is due to migraine component of headaches All agreed to suggested treatment plan.   Review of Systems     Objective:   Physical Exam  There was tenderness to palpation over the paraspinal musculature region of the cervical region cervical facet region of mild to moderate degree with moderate tenderness of the splenius capitis and occipitalis regions. The patient appeared to be with unremarkable Spurling's maneuver. Palpation of the acromioclavicular and glenohumeral joint regions reproduced mild to moderate discomfort. Palpation over the thoracic region thoracic facet region was with tenderness to palpation without crepitus of the thoracic region noted. Tinel and Phalen's maneuver were without  increased pain of mild degree with what appeared to be decreased grip strength on the left compared to the right. Palpation over the lumbar region lumbar facet region was with tenderness to palpation of moderate degree with lateral bending rotation extension and palpation of the lumbar facets reproducing moderate discomfort. Palpation of the PSIS and PII S region reproduces moderate discomfort as well with straight leg raising tolerated to 30. There was decreased EHL strength noted there was tenderness to palpation of the lower extremities without definite allodynia noted. There was negative Homans. Abdomen was nontender with no costovertebral angle tenderness noted. Predominant portion of patient's pain was with palpation of the splenius capitis and occipitalis region palpation which be produced patient's headache.           Assessment & Plan:    Bilateral occipital neuralgia  Migraine headache  Fracture of left foot (concern regarding changes suggestive of early complex regional pain syndrome)  L4-L5 level degenerative changes noted on prior MRI  Lumbar stenosis with neurogenic claudication  Lumbar facet syndrome  Lumbar radiculopathy  Carpal tunnel syndrome  Tenosynovitis of the left upper extremity      Continue present medication Zanaflex and oxycodone  Stellate ganglion block to be performed at time of return appointment   F/U PCP Dr. Brynda Greathouse for evaliation of  BP headache and general medical  condition. Follow-up Dr. Brynda Greathouse regarding fractures of the foot as well as with surgeon as discussed .   F/U surgical evaluation. Patient will follow-up with Dr. Elvina Mattes as discussed  F/U neurological evaluation. May consider PNCV/EMG studies and other studies pending follow-up evaluations  May consider radiofrequency rhizolysis or intraspinal procedures pending response to present treatment and F/U evaluation   Patient to call  Pain Management Center should patient have  concerns prior to scheduled return appointment

## 2015-12-03 NOTE — Patient Instructions (Addendum)
Continue present medication Zanaflex and oxycodone  Stellate ganglion block to be performed at time of return appointment   F/U PCP Dr. Brynda Greathouse for evaliation of  BP headache and general medical  condition. Follow-up Dr. Brynda Greathouse regarding fractures of the foot as well as with surgeon as discussed .   F/U surgical evaluation. Patient will follow-up with Dr. Elvina Mattes as discussed  F/U neurological evaluation. May consider PNCV/EMG studies and other studies pending follow-up evaluations  May consider radiofrequency rhizolysis or intraspinal procedures pending response to present treatment and F/U evaluation   Patient to call [facility] should patient have concerns prior to scheduled return appointmentGENERAL RISKS AND COMPLICATIONS  What are the risk, side effects and possible complications? Generally speaking, most procedures are safe.  However, with any procedure there are risks, side effects, and the possibility of complications.  The risks and complications are dependent upon the sites that are lesioned, or the type of nerve block to be performed.  The closer the procedure is to the spine, the more serious the risks are.  Great care is taken when placing the radio frequency needles, block needles or lesioning probes, but sometimes complications can occur. 1. Infection: Any time there is an injection through the skin, there is a risk of infection.  This is why sterile conditions are used for these blocks.  There are four possible types of infection. 1. Localized skin infection. 2. Central Nervous System Infection-This can be in the form of Meningitis, which can be deadly. 3. Epidural Infections-This can be in the form of an epidural abscess, which can cause pressure inside of the spine, causing compression of the spinal cord with subsequent paralysis. This would require an emergency surgery to decompress, and there are no guarantees that the patient would recover from the  paralysis. 4. Discitis-This is an infection of the intervertebral discs.  It occurs in about 1% of discography procedures.  It is difficult to treat and it may lead to surgery.        2. Pain: the needles have to go through skin and soft tissues, will cause soreness.       3. Damage to internal structures:  The nerves to be lesioned may be near blood vessels or    other nerves which can be potentially damaged.       4. Bleeding: Bleeding is more common if the patient is taking blood thinners such as  aspirin, Coumadin, Ticiid, Plavix, etc., or if he/she have some genetic predisposition  such as hemophilia. Bleeding into the spinal canal can cause compression of the spinal  cord with subsequent paralysis.  This would require an emergency surgery to  decompress and there are no guarantees that the patient would recover from the  paralysis.       5. Pneumothorax:  Puncturing of a lung is a possibility, every time a needle is introduced in  the area of the chest or upper back.  Pneumothorax refers to free air around the  collapsed lung(s), inside of the thoracic cavity (chest cavity).  Another two possible  complications related to a similar event would include: Hemothorax and Chylothorax.   These are variations of the Pneumothorax, where instead of air around the collapsed  lung(s), you may have blood or chyle, respectively.       6. Spinal headaches: They may occur with any procedures in the area of the spine.       7. Persistent CSF (Cerebro-Spinal Fluid) leakage: This is a rare  problem, but may occur  with prolonged intrathecal or epidural catheters either due to the formation of a fistulous  track or a dural tear.       8. Nerve damage: By working so close to the spinal cord, there is always a possibility of  nerve damage, which could be as serious as a permanent spinal cord injury with  paralysis.       9. Death:  Although rare, severe deadly allergic reactions known as "Anaphylactic  reaction" can  occur to any of the medications used.      10. Worsening of the symptoms:  We can always make thing worse.  What are the chances of something like this happening? Chances of any of this occuring are extremely low.  By statistics, you have more of a chance of getting killed in a motor vehicle accident: while driving to the hospital than any of the above occurring .  Nevertheless, you should be aware that they are possibilities.  In general, it is similar to taking a shower.  Everybody knows that you can slip, hit your head and get killed.  Does that mean that you should not shower again?  Nevertheless always keep in mind that statistics do not mean anything if you happen to be on the wrong side of them.  Even if a procedure has a 1 (one) in a 1,000,000 (million) chance of going wrong, it you happen to be that one..Also, keep in mind that by statistics, you have more of a chance of having something go wrong when taking medications.  Who should not have this procedure? If you are on a blood thinning medication (e.g. Coumadin, Plavix, see list of "Blood Thinners"), or if you have an active infection going on, you should not have the procedure.  If you are taking any blood thinners, please inform your physician.  How should I prepare for this procedure?  Do not eat or drink anything at least six hours prior to the procedure.  Bring a driver with you .  It cannot be a taxi.  Come accompanied by an adult that can drive you back, and that is strong enough to help you if your legs get weak or numb from the local anesthetic.  Take all of your medicines the morning of the procedure with just enough water to swallow them.  If you have diabetes, make sure that you are scheduled to have your procedure done first thing in the morning, whenever possible.  If you have diabetes, take only half of your insulin dose and notify our nurse that you have done so as soon as you arrive at the clinic.  If you are  diabetic, but only take blood sugar pills (oral hypoglycemic), then do not take them on the morning of your procedure.  You may take them after you have had the procedure.  Do not take aspirin or any aspirin-containing medications, at least eleven (11) days prior to the procedure.  They may prolong bleeding.  Wear loose fitting clothing that may be easy to take off and that you would not mind if it got stained with Betadine or blood.  Do not wear any jewelry or perfume  Remove any nail coloring.  It will interfere with some of our monitoring equipment.  NOTE: Remember that this is not meant to be interpreted as a complete list of all possible complications.  Unforeseen problems may occur.  BLOOD THINNERS The following drugs contain aspirin or other products, which can  cause increased bleeding during surgery and should not be taken for 2 weeks prior to and 1 week after surgery.  If you should need take something for relief of minor pain, you may take acetaminophen which is found in Tylenol,m Datril, Anacin-3 and Panadol. It is not blood thinner. The products listed below are.  Do not take any of the products listed below in addition to any listed on your instruction sheet.  A.P.C or A.P.C with Codeine Codeine Phosphate Capsules #3 Ibuprofen Ridaura  ABC compound Congesprin Imuran rimadil  Advil Cope Indocin Robaxisal  Alka-Seltzer Effervescent Pain Reliever and Antacid Coricidin or Coricidin-D  Indomethacin Rufen  Alka-Seltzer plus Cold Medicine Cosprin Ketoprofen S-A-C Tablets  Anacin Analgesic Tablets or Capsules Coumadin Korlgesic Salflex  Anacin Extra Strength Analgesic tablets or capsules CP-2 Tablets Lanoril Salicylate  Anaprox Cuprimine Capsules Levenox Salocol  Anexsia-D Dalteparin Magan Salsalate  Anodynos Darvon compound Magnesium Salicylate Sine-off  Ansaid Dasin Capsules Magsal Sodium Salicylate  Anturane Depen Capsules Marnal Soma  APF Arthritis pain formula Dewitt's Pills  Measurin Stanback  Argesic Dia-Gesic Meclofenamic Sulfinpyrazone  Arthritis Bayer Timed Release Aspirin Diclofenac Meclomen Sulindac  Arthritis pain formula Anacin Dicumarol Medipren Supac  Analgesic (Safety coated) Arthralgen Diffunasal Mefanamic Suprofen  Arthritis Strength Bufferin Dihydrocodeine Mepro Compound Suprol  Arthropan liquid Dopirydamole Methcarbomol with Aspirin Synalgos  ASA tablets/Enseals Disalcid Micrainin Tagament  Ascriptin Doan's Midol Talwin  Ascriptin A/D Dolene Mobidin Tanderil  Ascriptin Extra Strength Dolobid Moblgesic Ticlid  Ascriptin with Codeine Doloprin or Doloprin with Codeine Momentum Tolectin  Asperbuf Duoprin Mono-gesic Trendar  Aspergum Duradyne Motrin or Motrin IB Triminicin  Aspirin plain, buffered or enteric coated Durasal Myochrisine Trigesic  Aspirin Suppositories Easprin Nalfon Trillsate  Aspirin with Codeine Ecotrin Regular or Extra Strength Naprosyn Uracel  Atromid-S Efficin Naproxen Ursinus  Auranofin Capsules Elmiron Neocylate Vanquish  Axotal Emagrin Norgesic Verin  Azathioprine Empirin or Empirin with Codeine Normiflo Vitamin E  Azolid Emprazil Nuprin Voltaren  Bayer Aspirin plain, buffered or children's or timed BC Tablets or powders Encaprin Orgaran Warfarin Sodium  Buff-a-Comp Enoxaparin Orudis Zorpin  Buff-a-Comp with Codeine Equegesic Os-Cal-Gesic   Buffaprin Excedrin plain, buffered or Extra Strength Oxalid   Bufferin Arthritis Strength Feldene Oxphenbutazone   Bufferin plain or Extra Strength Feldene Capsules Oxycodone with Aspirin   Bufferin with Codeine Fenoprofen Fenoprofen Pabalate or Pabalate-SF   Buffets II Flogesic Panagesic   Buffinol plain or Extra Strength Florinal or Florinal with Codeine Panwarfarin   Buf-Tabs Flurbiprofen Penicillamine   Butalbital Compound Four-way cold tablets Penicillin   Butazolidin Fragmin Pepto-Bismol   Carbenicillin Geminisyn Percodan   Carna Arthritis Reliever Geopen Persantine    Carprofen Gold's salt Persistin   Chloramphenicol Goody's Phenylbutazone   Chloromycetin Haltrain Piroxlcam   Clmetidine heparin Plaquenil   Cllnoril Hyco-pap Ponstel   Clofibrate Hydroxy chloroquine Propoxyphen         Before stopping any of these medications, be sure to consult the physician who ordered them.  Some, such as Coumadin (Warfarin) are ordered to prevent or treat serious conditions such as "deep thrombosis", "pumonary embolisms", and other heart problems.  The amount of time that you may need off of the medication may also vary with the medication and the reason for which you were taking it.  If you are taking any of these medications, please make sure you notify your pain physician before you undergo any procedures.         Stellate Ganglion Block Patient Information  Description: The stellate ganglion is part of  a chain of nerves in the neck that innervate the shoulder, arm, and hand.  This chain of nerves lies beside the windpipe.  By injecting local anesthesia along the stellate ganglion, we block the nerve innervation to the arm and hand on that particular side.  Generally only sensory innervation (touch) is blocked and not motor innervation (strength).  This allows pain-free physical therapy, which is vital to many of the conditions that this nerve block helps.   After numbing the skin with local anesthesia, a small needle is passed to the stellate ganglion and the local anesthesia is again deposited.  The procedure generally takes less than one minute.  Conditions that my be helped with stellate ganglion blockade;   Reflex sympathetic dystrophy (RSD)  Postherpetic neuralgia  Diabetic neuropathy  Pain from nerve lesions or injury  Herpes zoster ( shingles  Vascular disease  Raynaud's disease  Preparation for the injection: 1. Do not eat any solid food or dairy products within 8 hours of your appointment. 2. You may drink clear liquids up to 3 hours  before appointment.  Clear liquids include water, black coffee, juice or soda.  No milk or cream please. 3. You may take your regular medication, including pain medications, with a sip of water before you appointment.  Diabetics should hold regular insulin (if taken separately) and take 1/2 normal NPH dose the morning of the procedure.  Carry some sugar containing items with you to your appointment. 4. A drive must accompany you and be prepared to drive you home after your procedure. 5. Bring all your current medications with you. 6. An IV may be inserted and sedation may be given at the discretion of the physician. 7. A blood pressure cuff, EKG and other monitors will often be applied during the procedure.  Some patients may need to have extra oxygen administered for a short period. 8. You will be asked to provide medical information, including your allergies, prior to the procedure.  We must know immediately if you are taking blood thinners (like Coumadin) or if you are allergic to IV iodine contrast (dye).  Possible side-effects   Bleeding from needle site  Blurry vision and droopy eyelid in that eye (temporary)  Stuffy nose  Temporary shortness of breath  Weakness in arm (temporary)  Arm may feel warm   Possible complications: All of these are extremely RARE   Seizures  Infection  Lung puncture  Bleeding in the neck  Nerve injury  Call if you experience:   Fever/chills  Extreme shortness of breath or inability to swallow  Redness, inflammation or drainage from the site  Weakness or numbness that last for greater than 24 hours  Please note:  If effective, we may have to do up to 3-5 blocks in a 3-4 week interval with physical therapy (depending on which condition you are being treated for).  After the procedure: You will be observed in the outpatient area and discharged home.  Please consult the discharge instructions given after the procedure for any  post-procedure questions.  If you have any questions please call [phone] [facility]

## 2015-12-08 ENCOUNTER — Emergency Department: Payer: Medicare Other

## 2015-12-08 ENCOUNTER — Encounter: Payer: Self-pay | Admitting: Emergency Medicine

## 2015-12-08 ENCOUNTER — Emergency Department
Admission: EM | Admit: 2015-12-08 | Discharge: 2015-12-08 | Disposition: A | Discharge status: Home / Self Care | Payer: Medicare Other | Attending: Emergency Medicine | Admitting: Emergency Medicine

## 2015-12-08 DIAGNOSIS — M4726 Other spondylosis with radiculopathy, lumbar region: Secondary | ICD-10-CM | POA: Diagnosis not present

## 2015-12-08 DIAGNOSIS — J40 Bronchitis, not specified as acute or chronic: Secondary | ICD-10-CM

## 2015-12-08 DIAGNOSIS — Z79899 Other long term (current) drug therapy: Secondary | ICD-10-CM | POA: Diagnosis not present

## 2015-12-08 DIAGNOSIS — M169 Osteoarthritis of hip, unspecified: Secondary | ICD-10-CM | POA: Diagnosis not present

## 2015-12-08 DIAGNOSIS — M5136 Other intervertebral disc degeneration, lumbar region: Secondary | ICD-10-CM | POA: Diagnosis not present

## 2015-12-08 DIAGNOSIS — F17210 Nicotine dependence, cigarettes, uncomplicated: Secondary | ICD-10-CM | POA: Diagnosis not present

## 2015-12-08 DIAGNOSIS — R0602 Shortness of breath: Secondary | ICD-10-CM | POA: Diagnosis present

## 2015-12-08 LAB — BASIC METABOLIC PANEL
Anion gap: 10 (ref 5–15)
BUN: 11 mg/dL (ref 6–20)
CALCIUM: 8.9 mg/dL (ref 8.9–10.3)
CHLORIDE: 105 mmol/L (ref 101–111)
CO2: 23 mmol/L (ref 22–32)
CREATININE: 0.61 mg/dL (ref 0.44–1.00)
Glucose, Bld: 89 mg/dL (ref 65–99)
Potassium: 2.7 mmol/L — CL (ref 3.5–5.1)
SODIUM: 138 mmol/L (ref 135–145)

## 2015-12-08 LAB — CBC
HCT: 36.7 % (ref 35.0–47.0)
Hemoglobin: 12.9 g/dL (ref 12.0–16.0)
MCH: 34.4 pg — ABNORMAL HIGH (ref 26.0–34.0)
MCHC: 35.1 g/dL (ref 32.0–36.0)
MCV: 97.9 fL (ref 80.0–100.0)
PLATELETS: 157 10*3/uL (ref 150–440)
RBC: 3.74 MIL/uL — AB (ref 3.80–5.20)
RDW: 13.1 % (ref 11.5–14.5)
WBC: 10.8 10*3/uL (ref 3.6–11.0)

## 2015-12-08 LAB — TROPONIN I

## 2015-12-08 MED ORDER — AZITHROMYCIN 250 MG PO TABS
ORAL_TABLET | ORAL | Status: AC
  Administered 2015-12-08: 500 mg via ORAL
  Filled 2015-12-08: qty 2

## 2015-12-08 MED ORDER — AZITHROMYCIN 500 MG PO TABS
500.0000 mg | ORAL_TABLET | Freq: Once | ORAL | Status: AC
  Administered 2015-12-08: 500 mg via ORAL

## 2015-12-08 MED ORDER — POTASSIUM CHLORIDE CRYS ER 20 MEQ PO TBCR
40.0000 meq | EXTENDED_RELEASE_TABLET | Freq: Once | ORAL | Status: AC
  Administered 2015-12-08: 40 meq via ORAL
  Filled 2015-12-08: qty 2

## 2015-12-08 MED ORDER — IPRATROPIUM-ALBUTEROL 0.5-2.5 (3) MG/3ML IN SOLN
3.0000 mL | Freq: Once | RESPIRATORY_TRACT | Status: AC
Start: 2015-12-08 — End: 2015-12-08
  Administered 2015-12-08: 3 mL via RESPIRATORY_TRACT
  Filled 2015-12-08: qty 3

## 2015-12-08 MED ORDER — IPRATROPIUM-ALBUTEROL 0.5-2.5 (3) MG/3ML IN SOLN
3.0000 mL | Freq: Once | RESPIRATORY_TRACT | Status: AC
  Administered 2015-12-08: 3 mL via RESPIRATORY_TRACT
  Filled 2015-12-08: qty 3

## 2015-12-08 MED ORDER — AZITHROMYCIN 250 MG PO TABS: ORAL_TABLET | ORAL | Status: AC

## 2015-12-08 NOTE — ED Notes (Signed)
Pt states she has been sick for 2 1/2 weeks. Pt states she feels like she cannot breathe and her chest is hurting.  Pt seen by PCP on Monday and started on prednisone but is not getting any better only worse. No atbx.

## 2015-12-08 NOTE — ED Provider Notes (Signed)
[facility] Emergency Department Provider Note  ____________________________________________    I have reviewed the triage vital signs and the nursing notes.   HISTORY  Chief Complaint Chest Pain and Shortness of Breath    HPI Janet Murray is a 45 y.o. female who presents with shortness of breath. Patient reports she has had a cough for 2 weeks now. She notes that recently she started to feel "winded". She has had tightness in her chest as well. She does smoke cigarettes and has for many many years. She denies fevers. No recent travel. No calf pain or swelling. No nausea or vomiting. No diaphoresis. Her physician put her on prednisone 10 mg daily for her cough with little improvement     Past Medical History  Diagnosis Date  . Urinary incontinence   . Arthritis   . GERD (gastroesophageal reflux disease)   . Chronic pain   . Bronchitis   . Degenerative joint disease (DJD) of hip   . Foot fracture, left 05/02/15    Patient Active Problem List   Diagnosis Date Noted  . Lumbar radiculopathy 11/08/2014  . Hereditary and idiopathic peripheral neuropathy 11/06/2014  . 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 Facet syndrome, lumbar 10/25/2014  . Sacroiliac joint dysfunction of both sides 10/25/2014  . Carpal tunnel syndrome of left wrist 10/25/2014  . Occipital neuralgia 10/25/2014  . Restless leg syndrome 10/25/2014  . DDD (degenerative disc disease), lumbar 10/25/2014    Past Surgical History  Procedure Laterality Date  . Bladder surgery    . Cesarean section    . Appendectomy    . Abdominal hysterectomy      Current Outpatient Rx  Name  Route  Sig  Dispense  Refill  . albuterol (PROVENTIL HFA;VENTOLIN HFA) 108 (90 BASE) MCG/ACT inhaler   Inhalation   Inhale 2 puffs into the lungs as needed for wheezing or shortness of breath.         . budesonide-formoterol (SYMBICORT) 80-4.5 MCG/ACT inhaler   Inhalation   Inhale 2 puffs into the lungs 2 (two) times daily. Reported on 12/03/2015         . cyclobenzaprine (FLEXERIL) 10 MG tablet   Oral   Take 10 mg by mouth 3 (three) times daily as needed for muscle spasms. Reported on 12/03/2015         . doxycycline (VIBRA-TABS) 100 MG tablet   Oral   Take 1 tablet (100 mg total) by mouth 2 (two) times daily. Patient not taking: Reported on 11/14/2015   14 tablet   Eval   . doxycycline (VIBRA-TABS) 100 MG tablet   Oral   Take 1 tablet (100 mg total) by mouth 2 (two) times daily. Patient not taking: Reported on 11/14/2015   14 tablet   Eval   . doxycycline (VIBRA-TABS) 100 MG tablet   Oral   Take 1 tablet (100 mg total) by mouth 2 (two) times daily. Patient not taking: Reported on 12/03/2015   14 tablet   Eval   . esomeprazole (NEXIUM) 40 MG capsule   Oral   Take 40 mg by mouth 2 (two) times daily.         Marland Kitchen levothyroxine (SYNTHROID, LEVOTHROID) 50 MCG tablet   Oral   Take 50 mcg by mouth daily.         Marland Kitchen LORazepam (ATIVAN) 0.5 MG tablet   Oral   Take 0.5 mg by mouth every 8 (eight) hours as needed for anxiety.         Marland Kitchen  meloxicam (MOBIC) 15 MG tablet   Oral   Take 15 mg by mouth daily.         . montelukast (SINGULAIR) 10 MG tablet   Oral   Take 10 mg by mouth at bedtime.         . ondansetron (ZOFRAN) 4 MG tablet   Oral   Take 1 tablet (4 mg total) by mouth every 8 (eight) hours as needed for nausea or vomiting.   15 tablet   0   . oxyCODONE-acetaminophen (PERCOCET/ROXICET) 5-325 MG tablet      Limit one half to 1 tab by mouth per day 2-4 times per day if tolerated a day if tolerated   120 tablet   0   . phentermine 30 MG capsule   Oral   Take 30 mg by mouth 2 (two) times daily. Reported on 11/14/2015         . pramipexole (MIRAPEX) 0.5 MG tablet   Oral   Take 0.5 mg by mouth at bedtime.         . solifenacin (VESICARE) 5 MG tablet   Oral   Take 5 mg by mouth daily.         . tizanidine (ZANAFLEX) 2  MG capsule      Limit 1 to 2 tabs by mouth per day or twice a day if tolerated   120 capsule   0   . topiramate (TOPAMAX) 25 MG capsule   Oral   Take 25 mg by mouth daily. 1-2 tablets           Allergies Penicillins; Ciprofloxacin; Hydrocodone; Aspirin; and Tape  Family History  Problem Relation Age of Onset  . Arthritis Father   . Heart disease Father   . Hypertension Father     Social History Social History  Substance Use Topics  . Smoking status: Current Every Day Smoker -- 0.50 packs/day for 20 years    Types: Cigarettes  . Smokeless tobacco: Never Used  . Alcohol Use: No    Review of Systems  Constitutional: Negative for fever. Eyes: Negative for redness ENT: Negative for sore throat Cardiovascular: As above Respiratory: As above Gastrointestinal: Negative for abdominal pain Genitourinary: Negative for dysuria. Musculoskeletal: Negative for back pain. Skin: Negative for rash. Neurological: Negative for focal weakness Psychiatric: no anxiety    ____________________________________________   PHYSICAL EXAM:  VITAL SIGNS: ED Triage Vitals  Enc Vitals Group     BP 12/08/15 1529 118/72 mmHg     Pulse Rate 12/08/15 1529 85     Resp 12/08/15 1529 22     Temp 12/08/15 1529 98.4 F (36.9 C)     Temp Source 12/08/15 1529 Oral     SpO2 12/08/15 1529 98 %     Weight 12/08/15 1525 160 lb (72.576 kg)     Height 12/08/15 1525 5\' 1"  (1.549 m)     Head Cir --      Peak Flow --      Pain Score --      Pain Loc --      Pain Edu? --      Excl. in [place]? --    Constitutional: Alert and oriented. Well appearing and in no distress.  Eyes: Conjunctivae are normal. No erythema or injection ENT   Head: Normocephalic and atraumatic.   Mouth/Throat: Mucous membranes are moist. Cardiovascular: Normal rate, regular rhythm. Normal and symmetric distal pulses are present in the upper extremities.  Respiratory: Normal respiratory effort without  tachypnea nor  retractions. Scattered mild active wheezes Gastrointestinal: Soft and non-tender in all quadrants. No distention. There is no CVA tenderness. Genitourinary: deferred Musculoskeletal: Nontender with normal range of motion in all extremities. No lower extremity tenderness nor edema. Neurologic:  Normal speech and language. No gross focal neurologic deficits are appreciated. Skin:  Skin is warm, dry and intact. No rash noted. Psychiatric: Mood and affect are normal. Patient exhibits appropriate insight and judgment.  ____________________________________________    LABS (pertinent positives/negatives)  Labs Reviewed  BASIC METABOLIC PANEL - Abnormal; Notable for the following:    Potassium 2.7 (*)    All other components within normal limits  CBC - Abnormal; Notable for the following:    RBC 3.74 (*)    MCH 34.4 (*)    All other components within normal limits  TROPONIN I    ____________________________________________   EKG  ED ECG REPORT I, Lavonia Drafts, the attending physician, personally viewed and interpreted this ECG.  Date: 12/08/2015 EKG Time: 3:30 PM Rate: 80 Rhythm: normal sinus rhythm QRS Axis: normal Intervals: normal ST/T Wave abnormalities: normal Conduction Disturbances: Incomplete right bundle branch block Narrative Interpretation: unremarkable   ____________________________________________    RADIOLOGY  Chest x-ray unremarkable  ____________________________________________   PROCEDURES  Procedure(s) performed: none  Critical Care performed: none  ____________________________________________   INITIAL IMPRESSION / ASSESSMENT AND PLAN / ED COURSE  Pertinent labs & imaging results that were available during my care of the patient were reviewed by me and considered in my medical decision making (see chart for details).  Patient overall well-appearing and in no acute distress. I suspect she has a viral bronchitis and given her history of  extensive smoking she is now developing some mild shortness of breath. We will treat with 2 nebs, check a chest x-ray labs and reevaluate. Her EKG is reassuring do not think this is ACS  Patient's labs are reassuring, chest x-ray is benign. She did feel better after nebulizer treatment. She is already on steroids at home. I'll add a Z-Pak given her significant smoking history and continued symptoms. Return precautions discussed  ____________________________________________   FINAL CLINICAL IMPRESSION(S) / ED DIAGNOSES  Final diagnoses:  Bronchitis          Lavonia Drafts, MD 12/08/15 [phone]

## 2015-12-08 NOTE — ED Notes (Signed)
States has been congested x 2 1/2 weeks. Has been seen and treated for chronic bronchitis, smokes. Saw pcp last week and her meds changed abreva,and placed on prednisone and was told her gerd may be causing the pain. States not feeling better and wanted to be checked out.

## 2015-12-08 NOTE — Discharge Instructions (Signed)
Upper Respiratory Infection, Adult Most upper respiratory infections (URIs) are a viral infection of the air passages leading to the lungs. A URI affects the nose, throat, and upper air passages. The most common type of URI is nasopharyngitis and is typically referred to as "the common cold." URIs run their course and usually go away on their own. Most of the time, a URI does not require medical attention, but sometimes a bacterial infection in the upper airways can follow a viral infection. This is called a secondary infection. Sinus and middle ear infections are common types of secondary upper respiratory infections. Bacterial pneumonia can also complicate a URI. A URI can worsen asthma and chronic obstructive pulmonary disease (COPD). Sometimes, these complications can require emergency medical care and may be life threatening.  CAUSES Almost all URIs are caused by viruses. A virus is a type of germ and can spread from one person to another.  RISKS FACTORS You may be at risk for a URI if:   You smoke.   You have chronic heart or lung disease.  You have a weakened defense (immune) system.   You are very young or very old.   You have nasal allergies or asthma.  You work in crowded or poorly ventilated areas.  You work in health care facilities or schools. SIGNS AND SYMPTOMS  Symptoms typically develop 2-3 days after you come in contact with a cold virus. Most viral URIs last 7-10 days. However, viral URIs from the influenza virus (flu virus) can last 14-18 days and are typically more severe. Symptoms may include:   Runny or stuffy (congested) nose.   Sneezing.   Cough.   Sore throat.   Headache.   Fatigue.   Fever.   Loss of appetite.   Pain in your forehead, behind your eyes, and over your cheekbones (sinus pain).  Muscle aches.  DIAGNOSIS  Your health care provider may diagnose a URI by:  Physical exam.  Tests to check that your symptoms are not due to  another condition such as:  Strep throat.  Sinusitis.  Pneumonia.  Asthma. TREATMENT  A URI goes away on its own with time. It cannot be cured with medicines, but medicines may be prescribed or recommended to relieve symptoms. Medicines may help:  Reduce your fever.  Reduce your cough.  Relieve nasal congestion. HOME CARE INSTRUCTIONS   Take medicines only as directed by your health care provider.   Gargle warm saltwater or take cough drops to comfort your throat as directed by your health care provider.  Use a warm mist humidifier or inhale steam from a shower to increase air moisture. This may make it easier to breathe.  Drink enough fluid to keep your urine clear or pale yellow.   Eat soups and other clear broths and maintain good nutrition.   Rest as needed.   Return to work when your temperature has returned to normal or as your health care provider advises. You may need to stay home longer to avoid infecting others. You can also use a face mask and careful hand washing to prevent spread of the virus.  Increase the usage of your inhaler if you have asthma.   Do not use any tobacco products, including cigarettes, chewing tobacco, or electronic cigarettes. If you need help quitting, ask your health care provider. PREVENTION  The best way to protect yourself from getting a cold is to practice good hygiene.   Avoid oral or hand contact with people with cold   symptoms.   Wash your hands often if contact occurs.  There is no clear evidence that vitamin C, vitamin E, echinacea, or exercise reduces the chance of developing a cold. However, it is always recommended to get plenty of rest, exercise, and practice good nutrition.  SEEK MEDICAL CARE IF:   You are getting worse rather than better.   Your symptoms are not controlled by medicine.   You have chills.  You have worsening shortness of breath.  You have Atlas or red mucus.  You have yellow or Dalby nasal  discharge.  You have pain in your face, especially when you bend forward.  You have a fever.  You have swollen neck glands.  You have pain while swallowing.  You have white areas in the back of your throat. SEEK IMMEDIATE MEDICAL CARE IF:   You have severe or persistent:  Headache.  Ear pain.  Sinus pain.  Chest pain.  You have chronic lung disease and any of the following:  Wheezing.  Prolonged cough.  Coughing up blood.  A change in your usual mucus.  You have a stiff neck.  You have changes in your:  Vision.  Hearing.  Thinking.  Mood. MAKE SURE YOU:   Understand these instructions.  Will watch your condition.  Will get help right away if you are not doing well or get worse.   This information is not intended to replace advice given to you by your health care provider. Make sure you discuss any questions you have with your health care provider.   Document Released: 11/19/2000 Document Revised: 10/10/2014 Document Reviewed: 08/31/2013 Elsevier Interactive Patient Education 2016 [employer]  

## 2015-12-19 ENCOUNTER — Encounter: Payer: Self-pay | Admitting: Pain Medicine

## 2015-12-19 ENCOUNTER — Ambulatory Visit: Payer: Medicare Other | Attending: Pain Medicine | Admitting: Pain Medicine

## 2015-12-19 DIAGNOSIS — M5136 Other intervertebral disc degeneration, lumbar region: Secondary | ICD-10-CM

## 2015-12-19 DIAGNOSIS — M160 Bilateral primary osteoarthritis of hip: Secondary | ICD-10-CM

## 2015-12-19 DIAGNOSIS — M5481 Occipital neuralgia: Secondary | ICD-10-CM

## 2015-12-19 DIAGNOSIS — M4726 Other spondylosis with radiculopathy, lumbar region: Secondary | ICD-10-CM

## 2015-12-19 DIAGNOSIS — M51369 Other intervertebral disc degeneration, lumbar region without mention of lumbar back pain or lower extremity pain: Secondary | ICD-10-CM

## 2015-12-19 DIAGNOSIS — G43109 Migraine with aura, not intractable, without status migrainosus: Secondary | ICD-10-CM

## 2015-12-19 DIAGNOSIS — G2581 Restless legs syndrome: Secondary | ICD-10-CM

## 2015-12-19 DIAGNOSIS — R51 Headache: Secondary | ICD-10-CM | POA: Diagnosis present

## 2015-12-19 DIAGNOSIS — M47816 Spondylosis without myelopathy or radiculopathy, lumbar region: Secondary | ICD-10-CM

## 2015-12-19 DIAGNOSIS — M5416 Radiculopathy, lumbar region: Secondary | ICD-10-CM

## 2015-12-19 DIAGNOSIS — G5602 Carpal tunnel syndrome, left upper limb: Secondary | ICD-10-CM

## 2015-12-19 DIAGNOSIS — G609 Hereditary and idiopathic neuropathy, unspecified: Secondary | ICD-10-CM

## 2015-12-19 DIAGNOSIS — M533 Sacrococcygeal disorders, not elsewhere classified: Secondary | ICD-10-CM

## 2015-12-19 DIAGNOSIS — G43909 Migraine, unspecified, not intractable, without status migrainosus: Secondary | ICD-10-CM | POA: Diagnosis not present

## 2015-12-19 DIAGNOSIS — S92912G Unspecified fracture of left toe(s), subsequent encounter for fracture with delayed healing: Secondary | ICD-10-CM

## 2015-12-19 MED ORDER — FENTANYL CITRATE (PF) 100 MCG/2ML IJ SOLN
100.0000 ug | Freq: Once | INTRAMUSCULAR | Status: AC
  Administered 2015-12-19: 100 ug via INTRAVENOUS
  Filled 2015-12-19: qty 2

## 2015-12-19 MED ORDER — BUPIVACAINE HCL (PF) 0.25 % IJ SOLN
30.0000 mL | Freq: Once | INTRAMUSCULAR | Status: AC
  Administered 2015-12-19: 30 mL
  Filled 2015-12-19: qty 30

## 2015-12-19 MED ORDER — MIDAZOLAM HCL 5 MG/5ML IJ SOLN
5.0000 mg | Freq: Once | INTRAMUSCULAR | Status: AC
  Administered 2015-12-19: 3 mg via INTRAVENOUS
  Filled 2015-12-19: qty 5

## 2015-12-19 MED ORDER — LACTATED RINGERS IV SOLN
1000.0000 mL | INTRAVENOUS | Status: DC
  Administered 2015-12-19: 1000 mL via INTRAVENOUS

## 2015-12-19 MED ORDER — ORPHENADRINE CITRATE 30 MG/ML IJ SOLN
60.0000 mg | Freq: Once | INTRAMUSCULAR | Status: AC
  Administered 2015-12-19: 60 mg via INTRAMUSCULAR
  Filled 2015-12-19: qty 2

## 2015-12-19 MED ORDER — TRIAMCINOLONE ACETONIDE 40 MG/ML IJ SUSP
40.0000 mg | Freq: Once | INTRAMUSCULAR | Status: AC
  Administered 2015-12-19: 40 mg
  Filled 2015-12-19: qty 1

## 2015-12-19 NOTE — Patient Instructions (Addendum)
PLAN   Continue present medication Zanaflex and oxycodone.  F/U PCP Dr. Brynda Greathouse for evaliation of  BP headache and general medical  condition   F/U surgical evaluation. Patient will follow-up with Dr. Elvina Mattes as discussed  F/U neurological evaluation. May consider PNCV/EMG studies and other studies pending follow-up evaluations  May consider radiofrequency rhizolysis or intraspinal procedures pending response to present treatment and F/U evaluation   Patient to call Pain Management Center should patient have concerns prior to scheduled return appointmentPain Management Discharge Instructions  General Discharge Instructions :  If you need to reach your doctor call: Monday-Friday 8:00 am - 4:00 pm at [phone] or toll free [phone].  After clinic hours [phone] to have operator reach doctor.  Bring all of your medication bottles to all your appointments in the pain clinic.  To cancel or reschedule your appointment with Pain Management please remember to call 24 hours in advance to avoid a fee.  Refer to the educational materials which you have been given on: General Risks, I had my Procedure. Discharge Instructions, Post Sedation.  Post Procedure Instructions:  The drugs you were given will stay in your system until tomorrow, so for the next 24 hours you should not drive, make any legal decisions or drink any alcoholic beverages.  You may eat anything you prefer, but it is better to start with liquids then soups and crackers, and gradually work up to solid foods.  Please notify your doctor immediately if you have any unusual bleeding, trouble breathing or pain that is not related to your normal pain.  Depending on the type of procedure that was done, some parts of your body may feel week and/or numb.  This usually clears up by tonight or the next day.  Walk with the use of an assistive device or accompanied by an adult for the 24 hours.  You may use ice on the affected  area for the first 24 hours.  Put ice in a Ziploc bag and cover with a towel and place against area 15 minutes on 15 minutes off.  You may switch to heat after 24 hours.

## 2015-12-19 NOTE — Progress Notes (Signed)
   Subjective:    Patient ID: Janet Murray, female    DOB: 1970-06-20, 45 y.o.   MRN: [id number]  HPI                                         RIGHT  STELLATE GANGLION BLOCK      The patient is a 45 -year-old female who returns to pain management for further evaluation and treatment of pain due to headache. The patient has a history of migraine headaches which have persisted despite prior treatment. Decision has been made to proceed with stellate ganglion block in attempt to decrease the severity and intensity of the patient's symptoms, decrease the frequency of the patient's symptoms, and avoid the need for more involved treatment. The risks, benefits, and expectations of the procedure have been discussed with patient  and explained to the patient who is with an understanding of the procedure and wishes to proceed with procedure at this time. All are in agreement with the suggested treatment plan.     Description Of Procedure:  Right Stellate Ganglion Block   The patient was taken to the fluoroscopy suite and assumed the supine position with the head hyperextended.  EKG, blood pressure, pulse, pulse oximetry, and capnography monitors were all in place.  Identification of the landmarks for the procedure was.  IV Versed and IV fentanyl conscious sedation was accomplished. Betadine prep of the proposed needle entry site was performed. Under fluoroscopic guidance a 22-gauge needle was inserted at the C-6 cervical tubercle on the right . A total of 10 cc of 0.25% bupivacaine was injected incrementally following negative aspiration for heme and for CSF.  The needle was removed .    Myoneural block injections of the lumbar region Following Betadine prep of proposed entry site a 22-gauge needle was inserted into the lumbar paraspinal musculature region and following negative aspiration 4 cc of 0.25% bupivacaine with Norflex was injected for myoneural block injection of the lumbar paraspinal  musculature region times to The patient tolerated the procedure well.    PLAN  Continue present medications Zanaflex and oxycodone  F/U PCP Dr. Brynda Greathouse for evaliation of  BP and general medical  condition  F/U surgical evaluation. May consider surgical evaluation as discussed  F/U neurological evaluation. Patient is to undergo neurological evaluation as planned  May consider radiofrequency rhizolysis or intraspinal procedures pending response to present treatment and F/U evaluation   The patient is to call the Pain Management Center should the patient have concerns prior to scheduled return appointment.          Review of Systems     Objective:   Physical Exam        Assessment & Plan:

## 2015-12-19 NOTE — Progress Notes (Signed)
Safety precautions to be maintained throughout the outpatient stay will include: orient to surroundings, keep bed in low position, maintain call bell within reach at all times, provide assistance with transfer out of bed and ambulation.  

## 2015-12-20 ENCOUNTER — Telehealth: Payer: Self-pay | Admitting: *Deleted

## 2015-12-20 NOTE — Telephone Encounter (Signed)
Message left

## 2016-01-01 ENCOUNTER — Encounter: Payer: Self-pay | Admitting: Pain Medicine

## 2016-01-01 ENCOUNTER — Ambulatory Visit: Payer: Medicare Other | Attending: Pain Medicine | Admitting: Pain Medicine

## 2016-01-01 VITALS — BP 114/70 | HR 73 | Temp 98.1°F | Resp 16 | Ht 61.0 in | Wt 162.0 lb

## 2016-01-01 DIAGNOSIS — M533 Sacrococcygeal disorders, not elsewhere classified: Secondary | ICD-10-CM

## 2016-01-01 DIAGNOSIS — R51 Headache: Secondary | ICD-10-CM | POA: Diagnosis present

## 2016-01-01 DIAGNOSIS — G5600 Carpal tunnel syndrome, unspecified upper limb: Secondary | ICD-10-CM | POA: Insufficient documentation

## 2016-01-01 DIAGNOSIS — G609 Hereditary and idiopathic neuropathy, unspecified: Secondary | ICD-10-CM

## 2016-01-01 DIAGNOSIS — G43909 Migraine, unspecified, not intractable, without status migrainosus: Secondary | ICD-10-CM | POA: Insufficient documentation

## 2016-01-01 DIAGNOSIS — M65829 Other synovitis and tenosynovitis, unspecified upper arm: Secondary | ICD-10-CM | POA: Diagnosis not present

## 2016-01-01 DIAGNOSIS — G5602 Carpal tunnel syndrome, left upper limb: Secondary | ICD-10-CM

## 2016-01-01 DIAGNOSIS — M47816 Spondylosis without myelopathy or radiculopathy, lumbar region: Secondary | ICD-10-CM

## 2016-01-01 DIAGNOSIS — M4806 Spinal stenosis, lumbar region: Secondary | ICD-10-CM | POA: Insufficient documentation

## 2016-01-01 DIAGNOSIS — M5136 Other intervertebral disc degeneration, lumbar region: Secondary | ICD-10-CM

## 2016-01-01 DIAGNOSIS — M5481 Occipital neuralgia: Secondary | ICD-10-CM | POA: Diagnosis not present

## 2016-01-01 DIAGNOSIS — G43109 Migraine with aura, not intractable, without status migrainosus: Secondary | ICD-10-CM

## 2016-01-01 DIAGNOSIS — M47896 Other spondylosis, lumbar region: Secondary | ICD-10-CM | POA: Diagnosis not present

## 2016-01-01 DIAGNOSIS — M5416 Radiculopathy, lumbar region: Secondary | ICD-10-CM

## 2016-01-01 DIAGNOSIS — M4726 Other spondylosis with radiculopathy, lumbar region: Secondary | ICD-10-CM

## 2016-01-01 MED ORDER — TIZANIDINE HCL 2 MG PO CAPS
ORAL_CAPSULE | ORAL | 0 refills | Status: DC
Start: 2016-01-01 — End: 2016-02-04

## 2016-01-01 MED ORDER — OXYCODONE-ACETAMINOPHEN 5-325 MG PO TABS: ORAL_TABLET | ORAL | 0 refills | Status: DC

## 2016-01-01 NOTE — Progress Notes (Signed)
    The patient is a 45 year old female who returns to pain management and states that she is doing remarkably well without complaint of headache status post stellate ganglion block performed on the right. We will continue patient's medications at this time including Topamax and oxycodone. The patient denies trauma change in events of daily living the call significant change in symptomatology. We've also discussed patient undergoing neurological assessment of headaches for further assessment of headaches which have improved significantly and have resolved following stellate ganglion block. All agreed to suggested treatment plan.    Physical examination  There was tenderness of the splenius capitis and occipitalis region a moderate degree with no bounding pulsations of the temporal region noted. There was minimal tenderness over the cervical facet cervical paraspinal musculature region and no new lesions of the head and neck were noted. There was evidence of mild tenderness to palpation of the thoracic paraspinal musculature region without crepitus of the thoracic region noted. There appeared to be unremarkable Spurling's maneuver. Patient was without increased pain with palpation of the acromioclavicular and glenohumeral joint regions of significant degree and appeared to be with slightly decreased grip strength. Tinel and Phalen's maneuver were without increased pain of significant degree. Palpation over the lumbar region was attends to palpation of mild to moderate degree with lateral bending rotation extension and palpation of the lumbar facets reproducing mild to moderate discomfort. Straight leg raising was tolerates approximately 30. There was tends to palpation of the left ankle and right ankle of moderate degree. There was no increased warmth or erythema of the ankle on the left noted. On the right ankle. EHL strength was decreased. There was negative Homans. Abdomen nontender with no costovertebral  tenderness noted.    Assessment   Bilateral occipital neuralgia  Migraine headache  Fracture of left foot (concern regarding changes suggestive of early complex regional pain syndrome)  L4-L5 level degenerative changes noted on prior MRI  Lumbar stenosis with neurogenic claudication  Lumbar facet syndrome  Lumbar radiculopathy  Carpal tunnel syndrome  Tenosynovitis of the left upper extremity      PLAN    Continue present medication Zanaflex and oxycodone  F/U PCP Dr. Brynda Greathouse for evaliation of  BP headache and general medical  condition. Follow-up Dr. Brynda Greathouse regarding fractures of the foot as well as with surgeon as discussed .   F/U surgical evaluation. Patient will follow-up with Dr. Elvina Mattes as discussed  F/U with physical therapy for instructions on gait, posturing, and for home exercise program to prevent progression of malalignment of foot as discussed  F/U neurological evaluation. Follow-up with neurologist for further assessment of headaches and for general neurological evaluation as discussed May consider PNCV/EMG studies and other studies pending follow-up evaluations  Please ask the nurses and secretary the date of your neurological evaluation of headaches and for general neurological evaluation. Headache resolved following stellate ganglion block on the right side  May consider radiofrequency rhizolysis or intraspinal procedures pending response to present treatment and F/U evaluation   Patient to call Pain Management Center should patient have concerns prior to scheduled return appointment

## 2016-01-01 NOTE — Patient Instructions (Addendum)
PLAN    Continue present medication Zanaflex and oxycodone  F/U PCP Dr. Brynda Greathouse for evaliation of  BP headache and general medical  condition. Follow-up Dr. Brynda Greathouse regarding fractures of the foot as well as with surgeon as discussed .   F/U surgical evaluation. Patient will follow-up with Dr. Elvina Mattes as discussed  F/U with physical therapy for instructions on gait, posturing, and for home exercise program to prevent progression of malalignment of foot as discussed  F/U neurological evaluation. Follow-up with neurologist for further assessment of headaches and for general neurological evaluation as discussed May consider PNCV/EMG studies and other studies pending follow-up evaluations  Please ask the nurses and secretary the date of your neurological evaluation of headaches and for general neurological evaluation. Headache resolved following stellate ganglion block on the right side  May consider radiofrequency rhizolysis or intraspinal procedures pending response to present treatment and F/U evaluation   Patient to call Pain Management Center should patient have concerns prior to scheduled return appointment

## 2016-01-30 ENCOUNTER — Encounter: Payer: Medicare Other | Admitting: Pain Medicine

## 2016-02-04 ENCOUNTER — Ambulatory Visit: Payer: Medicare Other | Attending: Pain Medicine | Admitting: Pain Medicine

## 2016-02-04 ENCOUNTER — Encounter: Payer: Self-pay | Admitting: Pain Medicine

## 2016-02-04 VITALS — BP 127/57 | HR 74 | Temp 98.3°F | Resp 18 | Ht 61.0 in | Wt 162.0 lb

## 2016-02-04 DIAGNOSIS — M65822 Other synovitis and tenosynovitis, left upper arm: Secondary | ICD-10-CM | POA: Diagnosis not present

## 2016-02-04 DIAGNOSIS — M533 Sacrococcygeal disorders, not elsewhere classified: Secondary | ICD-10-CM

## 2016-02-04 DIAGNOSIS — M5481 Occipital neuralgia: Secondary | ICD-10-CM | POA: Diagnosis not present

## 2016-02-04 DIAGNOSIS — M5136 Other intervertebral disc degeneration, lumbar region: Secondary | ICD-10-CM

## 2016-02-04 DIAGNOSIS — G43909 Migraine, unspecified, not intractable, without status migrainosus: Secondary | ICD-10-CM | POA: Diagnosis not present

## 2016-02-04 DIAGNOSIS — M4726 Other spondylosis with radiculopathy, lumbar region: Secondary | ICD-10-CM | POA: Diagnosis not present

## 2016-02-04 DIAGNOSIS — G2581 Restless legs syndrome: Secondary | ICD-10-CM

## 2016-02-04 DIAGNOSIS — X58XXXA Exposure to other specified factors, initial encounter: Secondary | ICD-10-CM | POA: Insufficient documentation

## 2016-02-04 DIAGNOSIS — M4806 Spinal stenosis, lumbar region: Secondary | ICD-10-CM | POA: Insufficient documentation

## 2016-02-04 DIAGNOSIS — R51 Headache: Secondary | ICD-10-CM | POA: Diagnosis present

## 2016-02-04 DIAGNOSIS — S92902A Unspecified fracture of left foot, initial encounter for closed fracture: Secondary | ICD-10-CM | POA: Diagnosis not present

## 2016-02-04 DIAGNOSIS — G5600 Carpal tunnel syndrome, unspecified upper limb: Secondary | ICD-10-CM | POA: Diagnosis not present

## 2016-02-04 DIAGNOSIS — G43009 Migraine without aura, not intractable, without status migrainosus: Secondary | ICD-10-CM

## 2016-02-04 DIAGNOSIS — M542 Cervicalgia: Secondary | ICD-10-CM | POA: Diagnosis present

## 2016-02-04 DIAGNOSIS — G609 Hereditary and idiopathic neuropathy, unspecified: Secondary | ICD-10-CM

## 2016-02-04 DIAGNOSIS — M47816 Spondylosis without myelopathy or radiculopathy, lumbar region: Secondary | ICD-10-CM

## 2016-02-04 DIAGNOSIS — M5416 Radiculopathy, lumbar region: Secondary | ICD-10-CM

## 2016-02-04 DIAGNOSIS — G5602 Carpal tunnel syndrome, left upper limb: Secondary | ICD-10-CM

## 2016-02-04 HISTORY — DX: Migraine, unspecified, not intractable, without status migrainosus: G43.909

## 2016-02-04 MED ORDER — OXYCODONE-ACETAMINOPHEN 5-325 MG PO TABS: ORAL_TABLET | ORAL | 0 refills | Status: DC

## 2016-02-04 MED ORDER — TIZANIDINE HCL 2 MG PO CAPS: ORAL_CAPSULE | ORAL | 0 refills | Status: DC

## 2016-02-04 NOTE — Patient Instructions (Addendum)
PLAN    Continue present medication Zanaflex and oxycodone  Greater occipital nerve block to be performed at time of return appointment  F/U PCP Dr. Brynda Greathouse for evaluation of  BP headache and general medical  condition. Follow-up Dr. Brynda Greathouse regarding fractures of the foot as well as with surgeon as discussed .   F/U surgical evaluation. Patient will follow-up with Dr. Elvina Mattes as discussed  F/U with physical therapy for instructions on gait, posturing, and for home exercise program to prevent progression of malalignment of foot as discussed  F/U neurological evaluation. Follow-up with neurologist for further assessment of headaches and for general neurological evaluation as discussed May consider PNCV/EMG studies and other studies as well pending follow-up evaluations  Please ask the nurses and secretary the date of your neurological evaluation of headaches and for general neurological evaluation.   May consider radiofrequency rhizolysis or intraspinal procedures pending response to present treatment and F/U evaluation   Patient to call Pain Management Center should patient have concerns prior to scheduled return appointment   Occipital Nerve Block Patient Information  Description: The occipital nerves originate in the cervical (neck) spinal cord and travel upward through muscle and tissue to supply sensation to the back of the head and top of the scalp.  In addition, the nerves control some of the muscles of the scalp.  Occipital neuralgia is an irritation of these nerves which can cause headaches, numbness of the scalp, and neck discomfort.     The occipital nerve block will interrupt nerve transmission through these nerves and can relieve pain and spasm.  The block consists of insertion of a small needle under the skin in the back of the head to deposit local anesthetic (numbing medicine) and/or steroids around the nerve.  The entire block usually lasts less than 5 minutes.  Conditions  which may be treated by occipital blocks:   Muscular pain and spasm of the scalp  Nerve irritation, back of the head  Headaches  Upper neck pain  Preparation for the injection:  1. Do not eat any solid food or dairy products within 8 hours of your appointment. 2. You may drink clear liquids up to 3 hours before appointment.  Clear liquids include water, black coffee, juice or soda.  No milk or cream please. 3. You may take your regular medication, including pain medications, with a sip of water before you appointment.  Diabetics should hold regular insulin (if taken separately) and take 1/2 normal NPH dose the morning of the procedure.  Carry some sugar containing items with you to your appointment. 4. A driver must accompany you and be prepared to drive you home after your procedure. 5. Bring all your current medications with you. 6. An IV may be inserted and sedation may be given at the discretion of the physician. 7. A blood pressure cuff, EKG, and other monitors will often be applied during the procedure.  Some patients may need to have extra oxygen administered for a short period. 8. You will be asked to provide medical information, including your allergies and medications, prior to the procedure.  We must know immediately if you are taking blood thinners (like Coumadin/Warfarin) or if you are allergic to IV iodine contrast (dye).  We must know if you could possible be pregnant.  9. Do not wear a high collared shirt or turtleneck.  Tie long hair up in the back if possible.  Possible side-effects:   Bleeding from needle site  Infection (rare, may require surgery)  Nerve injury (rare)  Hair on back of neck can be tinged with iodine scrub (this will wash out)  Light-headedness (temporary)  Pain at injection site (several days)  Decreased blood pressure (rare, temporary)  Seizure (very rare)  Call if you experience:   Hives or difficulty breathing ( go to the emergency  room)  Inflammation or drainage at the injection site(s)  Please note:  Although the local anesthetic injected can often make your painful muscles or headache feel good for several hours after the injection, the pain may return.  It takes 3-7 days for steroids to work.  You may not notice any pain relief for at least one week.  If effective, we will often do a series of injections spaced 3-6 weeks apart to maximally decrease your pain.  If you have any questions, please call [phone] [facility]

## 2016-02-04 NOTE — Progress Notes (Signed)
The patient is a 45 year old female who returns to pain management for further evaluation and treatment of pain involving the neck with headaches occurring on the left as well as on the right. The patient states that headaches occurred from the back of the neck radiating to the back of the head and go to the area behind the eyes. The patient denies trauma change in events of daily living because change in symptoms pathology. The patient admits to lower back lower extremity pain and states that headaches have been the most bothersome symptom. The patient states that the headaches occur throughout the day and interferes with ability to obtain restful sleep. We will proceed with bilateral occipital nerve blocks to be performed at time return appointment in attempt to decrease severity of symptoms, minimize progression of symptoms, and avoid the need for more involved treatment. Patient will undergo further neurological assessment and we will consider additional modifications of treatment regimen pending response to treatment and follow-up evaluation. The patient will continue the tizanidine and and oxycodone at this time as discussed. All agreed to suggested treatment plan.     Physical examination  There was tenderness over the splenius capitis and occipitalis region. Palpation of these regions reproduced severe pain. There were no bounding pulsations of the temporal region noted. Palpation of the cervical facet and thoracic facet regions reproduce severe discomfort as well. There was tenderness of the trapezius levator scapula and rhomboid musculature regions of severe degree. No crepitus of the thoracic region was noted. Palpation of the acromioclavicular and glenohumeral joint regions reproduce mild to moderate discomfort. The patient appeared to be with decreased equal grip strength. Tinel and Phalen's maneuver were without increased pain of significant degree.. Palpation over the lumbar region was  with moderate discomfort with lateral bending rotation extension and palpation of the lumbar facets reproducing moderate discomfort. There was moderate tenderness of the PSIS and PII S region as well as the gluteal and piriformis musculature regions with mild tenderness of the greater trochanteric region iliotibial band region.. Straight leg raise was tolerates approximately 20 with decreased EHL strength noted. No definite allodynia of the lower extremities were noted. There was increased sensitivity to touch of the lower extremities noted. There was negative Homans. No definite sensory deficit of dermatomal dystrophy she was detected. Abdomen was nontender with no costovertebral angle tenderness noted.    Assessment   Bilateral occipital neuralgia  Migraine headache  Fracture of left foot (concern regarding changes suggestive of early complex regional pain syndrome)  L4-L5 level degenerative changes noted on prior MRI  Lumbar stenosis with neurogenic claudication  Lumbar facet syndrome  Lumbar radiculopathy  Carpal tunnel syndrome  Tenosynovitis of the left upper extremity      PLAN    Continue present medications Zanaflex and oxycodone  Greater occipital nerve block to be performed at time of return appointment  F/U PCP Dr. Brynda Greathouse for evaluation of  BP headache and general medical  condition. Follow-up Dr. Brynda Greathouse regarding fractures of the foot as well as with surgeon as discussed .   F/U surgical evaluation. Patient will follow-up with Dr. Elvina Mattes as discussed  F/U with physical therapy for instructions on gait, posturing, and for home exercise program to prevent progression of malalignment of foot as discussed  F/U neurological evaluation. Follow-up with neurologist for further assessment of headaches and for general neurological evaluation as discussed May consider PNCV/EMG studies and other studies as well pending follow-up evaluations  Please ask the nurses and  secretary the date of your neurological evaluation of headaches and for general neurological evaluation.   May consider radiofrequency rhizolysis or intraspinal procedures pending response to present treatment and F/U evaluation   Patient to call Pain Management Center should patient have concerns prior to scheduled return appointment

## 2016-02-06 ENCOUNTER — Telehealth: Payer: Self-pay | Admitting: *Deleted

## 2016-02-06 ENCOUNTER — Ambulatory Visit: Payer: Medicare Other | Attending: Pain Medicine | Admitting: Pain Medicine

## 2016-02-06 VITALS — BP 106/62 | HR 56 | Temp 97.6°F | Resp 20 | Ht 61.0 in | Wt 162.0 lb

## 2016-02-06 DIAGNOSIS — R51 Headache: Secondary | ICD-10-CM | POA: Insufficient documentation

## 2016-02-06 DIAGNOSIS — G43009 Migraine without aura, not intractable, without status migrainosus: Secondary | ICD-10-CM

## 2016-02-06 DIAGNOSIS — M5136 Other intervertebral disc degeneration, lumbar region: Secondary | ICD-10-CM

## 2016-02-06 DIAGNOSIS — G609 Hereditary and idiopathic neuropathy, unspecified: Secondary | ICD-10-CM

## 2016-02-06 DIAGNOSIS — G5602 Carpal tunnel syndrome, left upper limb: Secondary | ICD-10-CM

## 2016-02-06 DIAGNOSIS — G2581 Restless legs syndrome: Secondary | ICD-10-CM

## 2016-02-06 DIAGNOSIS — M542 Cervicalgia: Secondary | ICD-10-CM | POA: Insufficient documentation

## 2016-02-06 DIAGNOSIS — M533 Sacrococcygeal disorders, not elsewhere classified: Secondary | ICD-10-CM

## 2016-02-06 DIAGNOSIS — M5481 Occipital neuralgia: Secondary | ICD-10-CM

## 2016-02-06 DIAGNOSIS — M47816 Spondylosis without myelopathy or radiculopathy, lumbar region: Secondary | ICD-10-CM

## 2016-02-06 DIAGNOSIS — M4726 Other spondylosis with radiculopathy, lumbar region: Secondary | ICD-10-CM

## 2016-02-06 DIAGNOSIS — M5416 Radiculopathy, lumbar region: Secondary | ICD-10-CM

## 2016-02-06 MED ORDER — TRIAMCINOLONE ACETONIDE 40 MG/ML IJ SUSP
INTRAMUSCULAR | Status: AC
  Administered 2016-02-06: 12:00:00
  Filled 2016-02-06: qty 1

## 2016-02-06 MED ORDER — LACTATED RINGERS IV SOLN: 1000.0000 mL | INTRAVENOUS | Status: DC

## 2016-02-06 MED ORDER — FENTANYL CITRATE (PF) 100 MCG/2ML IJ SOLN: 100.0000 ug | Freq: Once | INTRAMUSCULAR | Status: DC

## 2016-02-06 MED ORDER — MIDAZOLAM HCL 5 MG/5ML IJ SOLN: 5.0000 mg | Freq: Once | INTRAMUSCULAR | Status: DC

## 2016-02-06 MED ORDER — TIZANIDINE HCL 2 MG PO CAPS: ORAL_CAPSULE | ORAL | 0 refills | Status: DC

## 2016-02-06 MED ORDER — MIDAZOLAM HCL 5 MG/5ML IJ SOLN
INTRAMUSCULAR | Status: AC
  Administered 2016-02-06: 2 mg
  Filled 2016-02-06: qty 5

## 2016-02-06 MED ORDER — FENTANYL CITRATE (PF) 100 MCG/2ML IJ SOLN
INTRAMUSCULAR | Status: AC
  Administered 2016-02-06: 50 ug
  Filled 2016-02-06: qty 2

## 2016-02-06 MED ORDER — BUPIVACAINE HCL (PF) 0.25 % IJ SOLN
INTRAMUSCULAR | Status: AC
  Administered 2016-02-06: 12:00:00
  Filled 2016-02-06: qty 30

## 2016-02-06 MED ORDER — BUPIVACAINE HCL (PF) 0.25 % IJ SOLN: 30.0000 mL | Freq: Once | INTRAMUSCULAR | Status: DC

## 2016-02-06 MED ORDER — ORPHENADRINE CITRATE 30 MG/ML IJ SOLN: 60.0000 mg | Freq: Once | INTRAMUSCULAR | Status: DC

## 2016-02-06 MED ORDER — OXYCODONE-ACETAMINOPHEN 5-325 MG PO TABS: ORAL_TABLET | ORAL | 0 refills | Status: DC

## 2016-02-06 MED ORDER — ORPHENADRINE CITRATE 30 MG/ML IJ SOLN
INTRAMUSCULAR | Status: AC
  Administered 2016-02-06: 12:00:00
  Filled 2016-02-06: qty 2

## 2016-02-06 MED ORDER — TRIAMCINOLONE ACETONIDE 40 MG/ML IJ SUSP: 40.0000 mg | Freq: Once | INTRAMUSCULAR | Status: DC

## 2016-02-06 NOTE — Telephone Encounter (Signed)
Called [facility] back (Pharm)  States that pt brought in Rx  Monday 02/04/16 and then today 02/06/16  He wanted to know if he should fill it. Instructed him that the one from Monday should last a month and then fill next Rx next month

## 2016-02-06 NOTE — Patient Instructions (Addendum)
PLAN    Continue present medication Zanaflex and oxycodone  F/U PCP Dr. Brynda Greathouse for evaluation of  BP headache and general medical  condition. Follow-up Dr. Brynda Greathouse regarding fractures of the foot as well as with surgeon as previously discussed .   F/U surgical evaluation. Patient will follow-up with Dr. Elvina Mattes as discussed  F/U with physical therapy for instructions on gait, posturing, and for home exercise program to prevent progression of malalignment of foot as discussed  F/U neurological evaluation. Follow-up with neurologist for further assessment of headaches and for general neurological evaluation as discussed May consider PNCV/EMG studies and other studies as well pending follow-up evaluations  Please ask the nurses and secretary the date of your neurological evaluation of headaches and for general neurological evaluation as we discussed.   May consider radiofrequency rhizolysis or intraspinal procedures pending response to present treatment and F/U evaluation   Patient to call [facility] should patient have concerns prior to scheduled return appointmentPain Management Discharge Instructions  General Discharge Instructions :  If you need to reach your doctor call: Monday-Friday 8:00 am - 4:00 pm at [phone] or toll free [phone].  After clinic hours [phone] to have operator reach doctor.  Bring all of your medication bottles to all your appointments in the pain clinic.  To cancel or reschedule your appointment with Pain Management please remember to call 24 hours in advance to avoid a fee.  Refer to the educational materials which you have been given on: General Risks, I had my Procedure. Discharge Instructions, Post Sedation.  Post Procedure Instructions:  The drugs you were given will stay in your system until tomorrow, so for the next 24 hours you should not drive, make any legal decisions or drink any alcoholic beverages.  You may eat anything you  prefer, but it is better to start with liquids then soups and crackers, and gradually work up to solid foods.  Please notify your doctor immediately if you have any unusual bleeding, trouble breathing or pain that is not related to your normal pain.  Depending on the type of procedure that was done, some parts of your body may feel week and/or numb.  This usually clears up by tonight or the next day.  Walk with the use of an assistive device or accompanied by an adult for the 24 hours.  You may use ice on the affected area for the first 24 hours.  Put ice in a Ziploc bag and cover with a towel and place against area 15 minutes on 15 minutes off.  You may switch to heat after 24 hours.GENERAL RISKS AND COMPLICATIONS  What are the risk, side effects and possible complications? Generally speaking, most procedures are safe.  However, with any procedure there are risks, side effects, and the possibility of complications.  The risks and complications are dependent upon the sites that are lesioned, or the type of nerve block to be performed.  The closer the procedure is to the spine, the more serious the risks are.  Great care is taken when placing the radio frequency needles, block needles or lesioning probes, but sometimes complications can occur. 1. Infection: Any time there is an injection through the skin, there is a risk of infection.  This is why sterile conditions are used for these blocks.  There are four possible types of infection. 1. Localized skin infection. 2. Central Nervous System Infection-This can be in the form of Meningitis, which can be deadly. 3. Epidural Infections-This can be  in the form of an epidural abscess, which can cause pressure inside of the spine, causing compression of the spinal cord with subsequent paralysis. This would require an emergency surgery to decompress, and there are no guarantees that the patient would recover from the paralysis. 4. Discitis-This is an  infection of the intervertebral discs.  It occurs in about 1% of discography procedures.  It is difficult to treat and it may lead to surgery.        2. Pain: the needles have to go through skin and soft tissues, will cause soreness.       3. Damage to internal structures:  The nerves to be lesioned may be near blood vessels or    other nerves which can be potentially damaged.       4. Bleeding: Bleeding is more common if the patient is taking blood thinners such as  aspirin, Coumadin, Ticiid, Plavix, etc., or if he/she have some genetic predisposition  such as hemophilia. Bleeding into the spinal canal can cause compression of the spinal  cord with subsequent paralysis.  This would require an emergency surgery to  decompress and there are no guarantees that the patient would recover from the  paralysis.       5. Pneumothorax:  Puncturing of a lung is a possibility, every time a needle is introduced in  the area of the chest or upper back.  Pneumothorax refers to free air around the  collapsed lung(s), inside of the thoracic cavity (chest cavity).  Another two possible  complications related to a similar event would include: Hemothorax and Chylothorax.   These are variations of the Pneumothorax, where instead of air around the collapsed  lung(s), you may have blood or chyle, respectively.       6. Spinal headaches: They may occur with any procedures in the area of the spine.       7. Persistent CSF (Cerebro-Spinal Fluid) leakage: This is a rare problem, but may occur  with prolonged intrathecal or epidural catheters either due to the formation of a fistulous  track or a dural tear.       8. Nerve damage: By working so close to the spinal cord, there is always a possibility of  nerve damage, which could be as serious as a permanent spinal cord injury with  paralysis.       9. Death:  Although rare, severe deadly allergic reactions known as "Anaphylactic  reaction" can occur to any of the medications used.       10. Worsening of the symptoms:  We can always make thing worse.  What are the chances of something like this happening? Chances of any of this occuring are extremely low.  By statistics, you have more of a chance of getting killed in a motor vehicle accident: while driving to the hospital than any of the above occurring .  Nevertheless, you should be aware that they are possibilities.  In general, it is similar to taking a shower.  Everybody knows that you can slip, hit your head and get killed.  Does that mean that you should not shower again?  Nevertheless always keep in mind that statistics do not mean anything if you happen to be on the wrong side of them.  Even if a procedure has a 1 (one) in a 1,000,000 (million) chance of going wrong, it you happen to be that one..Also, keep in mind that by statistics, you have more of a chance of having something  go wrong when taking medications.  Who should not have this procedure? If you are on a blood thinning medication (e.g. Coumadin, Plavix, see list of "Blood Thinners"), or if you have an active infection going on, you should not have the procedure.  If you are taking any blood thinners, please inform your physician.  How should I prepare for this procedure?  Do not eat or drink anything at least six hours prior to the procedure.  Bring a driver with you .  It cannot be a taxi.  Come accompanied by an adult that can drive you back, and that is strong enough to help you if your legs get weak or numb from the local anesthetic.  Take all of your medicines the morning of the procedure with just enough water to swallow them.  If you have diabetes, make sure that you are scheduled to have your procedure done first thing in the morning, whenever possible.  If you have diabetes, take only half of your insulin dose and notify our nurse that you have done so as soon as you arrive at the clinic.  If you are diabetic, but only take blood sugar pills  (oral hypoglycemic), then do not take them on the morning of your procedure.  You may take them after you have had the procedure.  Do not take aspirin or any aspirin-containing medications, at least eleven (11) days prior to the procedure.  They may prolong bleeding.  Wear loose fitting clothing that may be easy to take off and that you would not mind if it got stained with Betadine or blood.  Do not wear any jewelry or perfume  Remove any nail coloring.  It will interfere with some of our monitoring equipment.  NOTE: Remember that this is not meant to be interpreted as a complete list of all possible complications.  Unforeseen problems may occur.  BLOOD THINNERS The following drugs contain aspirin or other products, which can cause increased bleeding during surgery and should not be taken for 2 weeks prior to and 1 week after surgery.  If you should need take something for relief of minor pain, you may take acetaminophen which is found in Tylenol,m Datril, Anacin-3 and Panadol. It is not blood thinner. The products listed below are.  Do not take any of the products listed below in addition to any listed on your instruction sheet.  A.P.C or A.P.C with Codeine Codeine Phosphate Capsules #3 Ibuprofen Ridaura  ABC compound Congesprin Imuran rimadil  Advil Cope Indocin Robaxisal  Alka-Seltzer Effervescent Pain Reliever and Antacid Coricidin or Coricidin-D  Indomethacin Rufen  Alka-Seltzer plus Cold Medicine Cosprin Ketoprofen S-A-C Tablets  Anacin Analgesic Tablets or Capsules Coumadin Korlgesic Salflex  Anacin Extra Strength Analgesic tablets or capsules CP-2 Tablets Lanoril Salicylate  Anaprox Cuprimine Capsules Levenox Salocol  Anexsia-D Dalteparin Magan Salsalate  Anodynos Darvon compound Magnesium Salicylate Sine-off  Ansaid Dasin Capsules Magsal Sodium Salicylate  Anturane Depen Capsules Marnal Soma  APF Arthritis pain formula Dewitt's Pills Measurin Stanback  Argesic Dia-Gesic  Meclofenamic Sulfinpyrazone  Arthritis Bayer Timed Release Aspirin Diclofenac Meclomen Sulindac  Arthritis pain formula Anacin Dicumarol Medipren Supac  Analgesic (Safety coated) Arthralgen Diffunasal Mefanamic Suprofen  Arthritis Strength Bufferin Dihydrocodeine Mepro Compound Suprol  Arthropan liquid Dopirydamole Methcarbomol with Aspirin Synalgos  ASA tablets/Enseals Disalcid Micrainin Tagament  Ascriptin Doan's Midol Talwin  Ascriptin A/D Dolene Mobidin Tanderil  Ascriptin Extra Strength Dolobid Moblgesic Ticlid  Ascriptin with Codeine Doloprin or Doloprin with Codeine Momentum Tolectin  Asperbuf  Duoprin Mono-gesic Trendar  Aspergum Duradyne Motrin or Motrin IB Triminicin  Aspirin plain, buffered or enteric coated Durasal Myochrisine Trigesic  Aspirin Suppositories Easprin Nalfon Trillsate  Aspirin with Codeine Ecotrin Regular or Extra Strength Naprosyn Uracel  Atromid-S Efficin Naproxen Ursinus  Auranofin Capsules Elmiron Neocylate Vanquish  Axotal Emagrin Norgesic Verin  Azathioprine Empirin or Empirin with Codeine Normiflo Vitamin E  Azolid Emprazil Nuprin Voltaren  Bayer Aspirin plain, buffered or children's or timed BC Tablets or powders Encaprin Orgaran Warfarin Sodium  Buff-a-Comp Enoxaparin Orudis Zorpin  Buff-a-Comp with Codeine Equegesic Os-Cal-Gesic   Buffaprin Excedrin plain, buffered or Extra Strength Oxalid   Bufferin Arthritis Strength Feldene Oxphenbutazone   Bufferin plain or Extra Strength Feldene Capsules Oxycodone with Aspirin   Bufferin with Codeine Fenoprofen Fenoprofen Pabalate or Pabalate-SF   Buffets II Flogesic Panagesic   Buffinol plain or Extra Strength Florinal or Florinal with Codeine Panwarfarin   Buf-Tabs Flurbiprofen Penicillamine   Butalbital Compound Four-way cold tablets Penicillin   Butazolidin Fragmin Pepto-Bismol   Carbenicillin Geminisyn Percodan   Carna Arthritis Reliever Geopen Persantine   Carprofen Gold's salt Persistin    Chloramphenicol Goody's Phenylbutazone   Chloromycetin Haltrain Piroxlcam   Clmetidine heparin Plaquenil   Cllnoril Hyco-pap Ponstel   Clofibrate Hydroxy chloroquine Propoxyphen         Before stopping any of these medications, be sure to consult the physician who ordered them.  Some, such as Coumadin (Warfarin) are ordered to prevent or treat serious conditions such as "deep thrombosis", "pumonary embolisms", and other heart problems.  The amount of time that you may need off of the medication may also vary with the medication and the reason for which you were taking it.  If you are taking any of these medications, please make sure you notify your pain physician before you undergo any procedures.

## 2016-02-06 NOTE — Progress Notes (Signed)
                                        BILATERAL OCCIPITAL NERVE BLOCKS  NOTE: The patient is a 45 y.o.-year-old female who returns to the [facility] for further evaluation and treatment of pain consisting of pain involving the region of the neck and headache.  Patient is with history of headaches with pain occurring the back of the neck radiating to the back of the head and continuing to the area behind the eye. The patient is with pain felt to be due to bilateral occipital neuralgia. There is also been concern regarding component of headaches been due to migraine headache.  The risks, benefits, and expectations of the procedure have been discussed and explained to patient, who is understanding and wishes to proceed with interventional treatment as discussed and as explained to patient.  Will proceed with greater occipital nerve blocks with myoneural block injections at this time as discussed and as explained to patient.  All are understanding and in agreement with suggested treatment plan.    PROCEDURE:  Greater occipital nerve block on the left side with IV Versed, IV Fentanyl, conscious sedation, EKG, blood pressure, pulse, capnography, and pulse oximetry monitoring.  Procedure performed with patient in prone position.  Greater occipital nerve block on the left side.   With patient in prone position, Betadine prep of proposed entry site accomplished.  Following identification of the nuchal ridge, 22 -gauge needle was inserted at the level of the nuchal ridge medial to the occipital artery.  Following negative aspiration, 4cc 0.25% bupivacaine with Kenalog injected for left greater occipital nerve block.  Needle was removed.  Patient tolerated injection well.   Greater occipital nerve block on the rightt side. The greater occipital nerve block on the right side was performed exactly as the left greater occipital nerve block was performed and utilizing the same technique.    Myoneural  block injections of the lumbar paraspinal musculature region Following Betadine prep of proposed entry site a 22-gauge needle was inserted into the lumbar paraspinal musculature region and following negative aspiration 4 cc of 0.25% bupivacaine with Norflex was injected for myoneural block injection of the lumbar paraspinal musculature region 2.   The patient tolerated the procedure well   A total of 10 mg Kenalog was utilized for the entire procedure    PLAN:    1. Medications: Will continue presently prescribed medications Zanaflex and oxycodone at this time. 2. Patient to follow up with primary care physician Dr. Brynda Greathouse for evaluation of blood pressure and general medical condition status post procedure performed on today's visit. 3. Neurological evaluation for further assessment of headaches and for other studies as discussed. 4. Surgical evaluation as discussed.  5. Patient may be candidate for Botox injections, radiofrequency procedures, as well as implantation type procedures pending response to treatment rendered on today's visit and pending follow-up evaluation. 6. Patient has been advised to adhere to proper body mechanics and to avoid activities which appear to aggravate condition.cations:  Will continue presently prescribed medications at this time. 7. The patient is understanding and in agreement with the suggested treatment plan.

## 2016-02-07 NOTE — Telephone Encounter (Signed)
Left voice mail

## 2016-02-15 ENCOUNTER — Telehealth: Payer: Self-pay | Admitting: *Deleted

## 2016-03-04 ENCOUNTER — Ambulatory Visit: Payer: Medicare Other | Admitting: Pain Medicine

## 2016-03-31 ENCOUNTER — Ambulatory Visit: Payer: Medicare Other | Admitting: Pain Medicine

## 2016-04-06 ENCOUNTER — Other Ambulatory Visit: Payer: Self-pay | Admitting: Pain Medicine

## 2016-08-05 ENCOUNTER — Other Ambulatory Visit: Payer: Self-pay | Admitting: Pediatrics

## 2016-08-05 NOTE — Telephone Encounter (Signed)
Rx request sent for Vesicare. Rx attached

## 2016-10-25 ENCOUNTER — Other Ambulatory Visit: Payer: Self-pay | Admitting: Obstetrics & Gynecology

## 2017-03-10 ENCOUNTER — Other Ambulatory Visit: Payer: Self-pay | Admitting: Internal Medicine

## 2017-03-10 DIAGNOSIS — K819 Cholecystitis, unspecified: Secondary | ICD-10-CM

## 2017-03-13 ENCOUNTER — Ambulatory Visit
Admission: RE | Admit: 2017-03-13 | Discharge: 2017-03-13 | Disposition: A | Discharge status: Home / Self Care | Payer: Medicare Other | Source: Ambulatory Visit | Attending: Internal Medicine | Admitting: Internal Medicine

## 2017-03-13 DIAGNOSIS — K8012 Calculus of gallbladder with acute and chronic cholecystitis without obstruction: Secondary | ICD-10-CM | POA: Insufficient documentation

## 2017-03-13 DIAGNOSIS — K819 Cholecystitis, unspecified: Secondary | ICD-10-CM

## 2017-03-24 ENCOUNTER — Emergency Department: Payer: Medicare Other

## 2017-03-24 ENCOUNTER — Emergency Department
Admission: EM | Admit: 2017-03-24 | Discharge: 2017-03-24 | Disposition: A | Discharge status: Home / Self Care | Payer: Medicare Other | Attending: Student in an Organized Health Care Education/Training Program | Admitting: Student in an Organized Health Care Education/Training Program

## 2017-03-24 DIAGNOSIS — Z79899 Other long term (current) drug therapy: Secondary | ICD-10-CM | POA: Insufficient documentation

## 2017-03-24 DIAGNOSIS — J40 Bronchitis, not specified as acute or chronic: Secondary | ICD-10-CM

## 2017-03-24 DIAGNOSIS — R1011 Right upper quadrant pain: Secondary | ICD-10-CM

## 2017-03-24 DIAGNOSIS — F17210 Nicotine dependence, cigarettes, uncomplicated: Secondary | ICD-10-CM | POA: Diagnosis not present

## 2017-03-24 HISTORY — DX: Anxiety disorder, unspecified: F41.9

## 2017-03-24 LAB — COMPREHENSIVE METABOLIC PANEL
ALBUMIN: 3.9 g/dL (ref 3.5–5.0)
ALK PHOS: 72 U/L (ref 38–126)
ALT: 9 U/L — AB (ref 14–54)
AST: 17 U/L (ref 15–41)
Anion gap: 9 (ref 5–15)
BUN: 9 mg/dL (ref 6–20)
CALCIUM: 9.4 mg/dL (ref 8.9–10.3)
CO2: 24 mmol/L (ref 22–32)
CREATININE: 0.48 mg/dL (ref 0.44–1.00)
Chloride: 104 mmol/L (ref 101–111)
GFR calc Af Amer: >60 mL/min (ref >60)
GFR calc non Af Amer: >60 mL/min (ref >60)
Glucose, Bld: 87 mg/dL (ref 65–99)
Potassium: 3.7 mmol/L (ref 3.5–5.1)
SODIUM: 137 mmol/L (ref 135–145)
Total Bilirubin: 0.6 mg/dL (ref 0.3–1.2)
Total Protein: 7 g/dL (ref 6.5–8.1)

## 2017-03-24 LAB — URINALYSIS, COMPLETE (UACMP) WITH MICROSCOPIC
Bacteria, UA: NONE SEEN
Bilirubin Urine: NEGATIVE
GLUCOSE, UA: NEGATIVE mg/dL
Ketones, ur: NEGATIVE mg/dL
Leukocytes, UA: NEGATIVE
Nitrite: NEGATIVE
PH: 5 (ref 5.0–8.0)
PROTEIN: NEGATIVE mg/dL
Specific Gravity, Urine: 1.014 (ref 1.005–1.030)

## 2017-03-24 LAB — CBC
HCT: 40.5 % (ref 35.0–47.0)
Hemoglobin: 13.8 g/dL (ref 12.0–16.0)
MCH: 33.1 pg (ref 26.0–34.0)
MCHC: 34.2 g/dL (ref 32.0–36.0)
MCV: 96.8 fL (ref 80.0–100.0)
PLATELETS: UNDETERMINED 10*3/uL (ref 150–440)
RBC: 4.18 MIL/uL (ref 3.80–5.20)
RDW: 13.1 % (ref 11.5–14.5)
WBC: 10.1 10*3/uL (ref 3.6–11.0)

## 2017-03-24 LAB — LIPASE, BLOOD: Lipase: 17 U/L (ref 11–51)

## 2017-03-24 LAB — TROPONIN I: Troponin I: <0.03 ng/mL (ref <0.03)

## 2017-03-24 MED ORDER — PREDNISONE 20 MG PO TABS: 40.0000 mg | ORAL_TABLET | Freq: Every day | ORAL | 0 refills | Status: AC

## 2017-03-24 MED ORDER — IPRATROPIUM-ALBUTEROL 0.5-2.5 (3) MG/3ML IN SOLN
RESPIRATORY_TRACT | Status: AC
  Administered 2017-03-24: 3 mL via RESPIRATORY_TRACT
  Filled 2017-03-24: qty 6

## 2017-03-24 MED ORDER — IOPAMIDOL (ISOVUE-300) INJECTION 61%
100.0000 mL | Freq: Once | INTRAVENOUS | Status: AC | PRN
  Administered 2017-03-24: 100 mL via INTRAVENOUS
  Filled 2017-03-24: qty 100

## 2017-03-24 MED ORDER — IPRATROPIUM-ALBUTEROL 0.5-2.5 (3) MG/3ML IN SOLN
3.0000 mL | Freq: Once | RESPIRATORY_TRACT | Status: AC
  Administered 2017-03-24: 3 mL via RESPIRATORY_TRACT

## 2017-03-24 MED ORDER — METHYLPREDNISOLONE SODIUM SUCC 125 MG IJ SOLR
80.0000 mg | Freq: Once | INTRAMUSCULAR | Status: AC
  Administered 2017-03-24: 80 mg via INTRAVENOUS

## 2017-03-24 MED ORDER — POLYETHYLENE GLYCOL 3350 17 G PO PACK: 17.0000 g | PACK | Freq: Every day | ORAL | 0 refills | Status: DC

## 2017-03-24 MED ORDER — DOXYCYCLINE HYCLATE 50 MG PO CAPS: 100.0000 mg | ORAL_CAPSULE | Freq: Two times a day (BID) | ORAL | 0 refills | Status: AC

## 2017-03-24 MED ORDER — METHYLPREDNISOLONE SODIUM SUCC 125 MG IJ SOLR
INTRAMUSCULAR | Status: AC
  Administered 2017-03-24: 80 mg via INTRAVENOUS
  Filled 2017-03-24: qty 2

## 2017-03-24 NOTE — ED Notes (Signed)
abd pain x3 weeks, SHOB/ panic attacks

## 2017-03-24 NOTE — ED Triage Notes (Signed)
Pt sent from Dr,. [place] office with c/o RUQ pain for the past 2 weeks with N/V. Denies diarrhea, last BM today was normal. Pt also c/o increased SOB with a hx of chronic brochitis. States she had a negative ultrasound today.

## 2017-03-24 NOTE — ED Provider Notes (Signed)
[facility] Emergency Department Provider Note    None    (approximate)  I have reviewed the triage vital signs and the nursing notes.   HISTORY  Chief Complaint Abdominal Pain    HPI Janet Murray is a 46 y.o. female history of anxiety as well as chronic bronchitis and persistent smoking history as well as GERD presents with chief complaint of persistent shortness of breath as well as epigastric discomfort. Patient had outpatient ultrasound was ordered by her primary care doctor which was normal. She states that she was having persistent shortness of breath since she came to the ER. While waiting to be triage the patient was called number of times and was found to be smoking in her car.  She nurses nonproductive cough. No fevers. No chest pains. States that she feels that her abdominal area is swelling. She does have follow-up with outpatient GI.   Past Medical History:  Diagnosis Date  . Anxiety   . Arthritis   . Bronchitis   . Chronic pain   . Degenerative joint disease (DJD) of hip   . Foot fracture, left 05/02/15  . GERD (gastroesophageal reflux disease)   . Urinary incontinence    Family History  Problem Relation Age of Onset  . Arthritis Father   . Heart disease Father   . Hypertension Father    Past Surgical History:  Procedure Laterality Date  . ABDOMINAL HYSTERECTOMY    . APPENDECTOMY    . BLADDER SURGERY    . CESAREAN SECTION     Patient Active Problem List   Diagnosis Date Noted  . Migraine 02/04/2016  . Lumbar radiculopathy 11/08/2014  . Hereditary and idiopathic peripheral neuropathy 11/06/2014  . 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 Facet syndrome, lumbar 10/25/2014  . Sacroiliac joint dysfunction of both sides 10/25/2014  . Carpal tunnel syndrome of left wrist 10/25/2014  . Occipital neuralgia 10/25/2014  . Restless leg syndrome 10/25/2014  . DDD (degenerative disc disease), lumbar  10/25/2014      Prior to Admission medications   Medication Sig Start Date End Date Taking? Authorizing Provider  albuterol (PROVENTIL HFA;VENTOLIN HFA) 108 (90 BASE) MCG/ACT inhaler Inhale 2 puffs into the lungs as needed for wheezing or shortness of breath.    [provider]  budesonide-formoterol (SYMBICORT) 80-4.5 MCG/ACT inhaler Inhale 2 puffs into the lungs 2 (two) times daily. Reported on 12/03/2015    [provider]  doxycycline (VIBRA-TABS) 100 MG tablet Take 1 tablet (100 mg total) by mouth 2 (two) times daily. Patient not taking: Reported on 02/06/2016 08/08/15   Mohammed Kindle, MD  doxycycline (VIBRA-TABS) 100 MG tablet Take 1 tablet (100 mg total) by mouth 2 (two) times daily. Patient not taking: Reported on 02/06/2016 10/19/15   Mohammed Kindle, MD  doxycycline (VIBRA-TABS) 100 MG tablet Take 1 tablet (100 mg total) by mouth 2 (two) times daily. Patient not taking: Reported on 02/06/2016 12/03/15   Mohammed Kindle, MD  esomeprazole (NEXIUM) 40 MG capsule Take 40 mg by mouth 2 (two) times daily.    [provider]  levothyroxine (SYNTHROID, LEVOTHROID) 50 MCG tablet Take 50 mcg by mouth daily.    [provider]  LORazepam (ATIVAN) 0.5 MG tablet Take 0.5 mg by mouth every 8 (eight) hours as needed for anxiety.    [provider]  meloxicam (MOBIC) 15 MG tablet Take 15 mg by mouth daily.    [provider]  montelukast (SINGULAIR)  10 MG tablet Take 10 mg by mouth at bedtime.    [provider]  ondansetron (ZOFRAN) 4 MG tablet Take 1 tablet (4 mg total) by mouth every 8 (eight) hours as needed for nausea or vomiting. 12/01/14   Paulette Blanch, MD  oxyCODONE-acetaminophen (PERCOCET/ROXICET) 5-325 MG tablet Limit one half to 1 tab by mouth per day 2-3 times per day if tolerated a day if tolerated 02/06/16   Mohammed Kindle, MD  pramipexole (MIRAPEX) 0.5 MG tablet Take 0.5 mg by mouth at bedtime.    [provider]  predniSONE  (DELTASONE) 10 MG tablet Take 10-60 mg by mouth See admin instructions. Take 6 tablets (60 mg) by mouth x 1 day, then 5 tablets (50 mg) by mouth x 1 day, then 4 tablets (40 mg) by mouth x 1 day, then 3 tablets (30 mg) by mouth x 1 day, then 2 tablets (20 mg) by mouth x 1 day, then 1 tablet by mouth x 1 day. 12/03/15   [provider]  tiotropium (SPIRIVA) 18 MCG inhalation capsule Place 18 mcg into inhaler and inhale daily.    [provider]  tizanidine (ZANAFLEX) 2 MG capsule Limit 1 tab by mouth per day or 2 - 3 times a day if tolerated 02/06/16   Mohammed Kindle, MD  topiramate (TOPAMAX) 25 MG tablet Take 25-50 mg by mouth daily.    [provider]  VESICARE 5 MG tablet TAKE 1 TABLET BY MOUTH EVERY DAY. 10/25/16   Janet Dry, MD    Allergies Penicillins; Ciprofloxacin; Hydrocodone; Aspirin; and Tape    Social History Social History  Substance Use Topics  . Smoking status: Current Every Day Smoker    Packs/day: 0.50    Years: 20.00    Types: Cigarettes  . Smokeless tobacco: Never Used  . Alcohol use 0.0 oz/week     Comment: occassionally    Review of Systems Patient denies headaches, rhinorrhea, blurry vision, numbness, shortness of breath, chest pain, edema, cough, abdominal pain, nausea, vomiting, diarrhea, dysuria, fevers, rashes or hallucinations unless otherwise stated above in HPI. ____________________________________________   PHYSICAL EXAM:  VITAL SIGNS: Vitals:   03/24/17 1152  BP: 128/74  Pulse: 76  Resp: 20  Temp: 98.6 F (37 C)  SpO2: 98%    Constitutional: Alert and oriented.  in no acute distress. Eyes: Conjunctivae are normal.  Head: Atraumatic. Nose: No congestion/rhinnorhea. Mouth/Throat: Mucous membranes are moist.   Neck: No stridor. Painless ROM.  Cardiovascular: Normal rate, regular rhythm. Grossly normal heart sounds.  Good peripheral circulation. Respiratory: mild tachypnea, expiratory wheeze throughout with  prolonged expiratory phase Gastrointestinal: Soft and nontender. No distention. No abdominal bruits. No CVA tenderness. Musculoskeletal: No lower extremity tenderness nor edema.  No joint effusions. Neurologic:  Normal speech and language. No gross focal neurologic deficits are appreciated. No facial droop Skin:  Skin is warm, Murray and intact. No rash noted. Psychiatric: Mood and affect are normal. Speech and behavior are normal.  ____________________________________________   LABS (all labs ordered are listed, but only abnormal results are displayed)  Results for orders placed or performed during the hospital encounter of 03/24/17 (from the past 24 hour(s))  Lipase, blood     Status: None   Collection Time: 03/24/17 11:52 AM  Result Value Ref Range   Lipase 17 11 - 51 U/L  Comprehensive metabolic panel     Status: Abnormal   Collection Time: 03/24/17 11:52 AM  Result Value Ref Range   Sodium  137 135 - 145 mmol/L   Potassium 3.7 3.5 - 5.1 mmol/L   Chloride 104 101 - 111 mmol/L   CO2 24 22 - 32 mmol/L   Glucose, Bld 87 65 - 99 mg/dL   BUN 9 6 - 20 mg/dL   Creatinine, Ser 0.48 0.44 - 1.00 mg/dL   Calcium 9.4 8.9 - 10.3 mg/dL   Total Protein 7.0 6.5 - 8.1 g/dL   Albumin 3.9 3.5 - 5.0 g/dL   AST 17 15 - 41 U/L   ALT 9 (L) 14 - 54 U/L   Alkaline Phosphatase 72 38 - 126 U/L   Total Bilirubin 0.6 0.3 - 1.2 mg/dL   GFR calc non Af Amer >60 >60 mL/min   GFR calc Af Amer >60 >60 mL/min   Anion gap 9 5 - 15  CBC     Status: None   Collection Time: 03/24/17 11:52 AM  Result Value Ref Range   WBC 10.1 3.6 - 11.0 K/uL   RBC 4.18 3.80 - 5.20 MIL/uL   Hemoglobin 13.8 12.0 - 16.0 g/dL   HCT 40.5 35.0 - 47.0 %   MCV 96.8 80.0 - 100.0 fL   MCH 33.1 26.0 - 34.0 pg   MCHC 34.2 32.0 - 36.0 g/dL   RDW 13.1 11.5 - 14.5 %   Platelets PLATELET CLUMPS NOTED ON SMEAR, UNABLE TO ESTIMATE 150 - 440 K/uL  Urinalysis, Complete w Microscopic     Status: Abnormal   Collection Time: 03/24/17 11:52 AM    Result Value Ref Range   Color, Urine YELLOW (A) YELLOW   APPearance CLEAR (A) CLEAR   Specific Gravity, Urine 1.014 1.005 - 1.030   pH 5.0 5.0 - 8.0   Glucose, UA NEGATIVE NEGATIVE mg/dL   Hgb urine dipstick MODERATE (A) NEGATIVE   Bilirubin Urine NEGATIVE NEGATIVE   Ketones, ur NEGATIVE NEGATIVE mg/dL   Protein, ur NEGATIVE NEGATIVE mg/dL   Nitrite NEGATIVE NEGATIVE   Leukocytes, UA NEGATIVE NEGATIVE   RBC / HPF 0-5 0 - 5 RBC/hpf   WBC, UA 0-5 0 - 5 WBC/hpf   Bacteria, UA NONE SEEN NONE SEEN   Squamous Epithelial / LPF 0-5 (A) NONE SEEN   Mucus PRESENT    ____________________________________________  EKG My review and personal interpretation at Time: 12:02   Indication: sob and abd pain  Rate: 80  Rhythm: sinus Axis: normal Other: no stemi, normal intervals, non specific st changes ____________________________________________  RADIOLOGY  I personally reviewed all radiographic images ordered to evaluate for the above acute complaints and reviewed radiology reports and findings.  These findings were personally discussed with the patient.  Please see medical record for radiology report.  ____________________________________________   PROCEDURES  Procedure(s) performed:  Procedures    Critical Care performed: no ____________________________________________   INITIAL IMPRESSION / ASSESSMENT AND PLAN / ED COURSE  Pertinent labs & imaging results that were available during my care of the patient were reviewed by me and considered in my medical decision making (see chart for details).  DDX: copd, bronchitis, cholelithiasis, cholecystitis, sbo, enteritis, gastritis, acs  Kyera Felan Golda is a 46 y.o. who presents to the ED with right upper quadrant pain for 2 weeks as his nausea vomiting and shortness of breath. She is otherwise fairly well appearing and in no acute distress. Does have some wheezing on exam and shortness of breath appears more consistent with bronchitis.  Denies any chest pain to suggest ACS. EKG with nonspecific changes but is really  no STEMI, interval changes or T-wave inversions. We will further risk stratify with troponin. The patient had no right upper quadrant ultrasound today. This is not likely consistent with cholelithiasis or cholecystitis but will order CT abdomen to evaluate other sources of abdominal pain.  The patient will be placed on continuous pulse oximetry and telemetry for monitoring.  Laboratory evaluation will be sent to evaluate for the above complaints.     Clinical Course as of Mar 25 1547  Tue Mar 24, 2017  1513 Patient was able to ambulate about the ER without hypoxia.  [PR]  1547 repeat abdominal exam is soft and benign. Her CT imaging shows no evidence of acute abnormality. No evidence of acute abdomen. Respirations and breath sounds and is medically improved after nebulizer treatment. Will give a brief course of doxycycline given her heavy smoking history and cough. Will treat as bronchitis. Not clinically consistent with ACS as he is no EKG changes and her troponin is normal. At this point given the patient is stable for further management as an outpatient.  [PR]    Clinical Course User Index [PR] Merlyn Lot, MD     ____________________________________________   FINAL CLINICAL IMPRESSION(S) / ED DIAGNOSES  Final diagnoses:  Right upper quadrant abdominal pain  Bronchitis      NEW MEDICATIONS STARTED DURING THIS VISIT:  New Prescriptions   No medications on file     Note:  This document was prepared using Dragon voice recognition software and may include unintentional dictation errors.    Merlyn Lot, MD 03/24/17 [phone]

## 2017-03-24 NOTE — Discharge Instructions (Signed)

## 2017-04-20 DIAGNOSIS — K588 Other irritable bowel syndrome: Secondary | ICD-10-CM | POA: Insufficient documentation

## 2017-04-20 DIAGNOSIS — R1319 Other dysphagia: Secondary | ICD-10-CM | POA: Insufficient documentation

## 2017-04-20 HISTORY — DX: Other irritable bowel syndrome: K58.8

## 2017-06-22 ENCOUNTER — Emergency Department
Admission: EM | Admit: 2017-06-22 | Discharge: 2017-06-22 | Disposition: A | Discharge status: Home / Self Care | Payer: Medicare Other | Attending: Emergency Medicine | Admitting: Emergency Medicine

## 2017-06-22 ENCOUNTER — Emergency Department: Payer: Medicare Other

## 2017-06-22 ENCOUNTER — Other Ambulatory Visit: Payer: Self-pay

## 2017-06-22 DIAGNOSIS — E876 Hypokalemia: Secondary | ICD-10-CM | POA: Diagnosis not present

## 2017-06-22 DIAGNOSIS — R0789 Other chest pain: Secondary | ICD-10-CM | POA: Insufficient documentation

## 2017-06-22 DIAGNOSIS — Z79899 Other long term (current) drug therapy: Secondary | ICD-10-CM | POA: Insufficient documentation

## 2017-06-22 DIAGNOSIS — J40 Bronchitis, not specified as acute or chronic: Secondary | ICD-10-CM

## 2017-06-22 DIAGNOSIS — F17210 Nicotine dependence, cigarettes, uncomplicated: Secondary | ICD-10-CM | POA: Insufficient documentation

## 2017-06-22 DIAGNOSIS — R0600 Dyspnea, unspecified: Secondary | ICD-10-CM | POA: Diagnosis present

## 2017-06-22 DIAGNOSIS — J45901 Unspecified asthma with (acute) exacerbation: Secondary | ICD-10-CM

## 2017-06-22 LAB — BASIC METABOLIC PANEL
Anion gap: 11 (ref 5–15)
BUN: 7 mg/dL (ref 6–20)
CALCIUM: 9.2 mg/dL (ref 8.9–10.3)
CO2: 25 mmol/L (ref 22–32)
CREATININE: 0.69 mg/dL (ref 0.44–1.00)
Chloride: 104 mmol/L (ref 101–111)
GFR calc Af Amer: >60 mL/min (ref >60)
Glucose, Bld: 103 mg/dL — ABNORMAL HIGH (ref 65–99)
Potassium: 3.1 mmol/L — ABNORMAL LOW (ref 3.5–5.1)
Sodium: 140 mmol/L (ref 135–145)

## 2017-06-22 LAB — CBC
HEMATOCRIT: 41.2 % (ref 35.0–47.0)
Hemoglobin: 13.7 g/dL (ref 12.0–16.0)
MCH: 32.6 pg (ref 26.0–34.0)
MCHC: 33.4 g/dL (ref 32.0–36.0)
MCV: 97.7 fL (ref 80.0–100.0)
PLATELETS: 117 10*3/uL — AB (ref 150–440)
RBC: 4.22 MIL/uL (ref 3.80–5.20)
RDW: 12.8 % (ref 11.5–14.5)
WBC: 12.1 10*3/uL — ABNORMAL HIGH (ref 3.6–11.0)

## 2017-06-22 LAB — TROPONIN I

## 2017-06-22 MED ORDER — IPRATROPIUM-ALBUTEROL 0.5-2.5 (3) MG/3ML IN SOLN
3.0000 mL | Freq: Once | RESPIRATORY_TRACT | Status: AC
  Administered 2017-06-22: 3 mL via RESPIRATORY_TRACT
  Filled 2017-06-22: qty 3

## 2017-06-22 MED ORDER — POTASSIUM CHLORIDE CRYS ER 20 MEQ PO TBCR
EXTENDED_RELEASE_TABLET | ORAL | Status: AC
  Filled 2017-06-22: qty 1

## 2017-06-22 MED ORDER — POTASSIUM CHLORIDE CRYS ER 20 MEQ PO TBCR
40.0000 meq | EXTENDED_RELEASE_TABLET | Freq: Once | ORAL | Status: AC
  Administered 2017-06-22: 40 meq via ORAL
  Filled 2017-06-22: qty 2

## 2017-06-22 MED ORDER — PREDNISONE 10 MG PO TABS: ORAL_TABLET | ORAL | 0 refills | Status: DC

## 2017-06-22 MED ORDER — PREDNISONE 20 MG PO TABS
40.0000 mg | ORAL_TABLET | Freq: Once | ORAL | Status: AC
  Administered 2017-06-22: 40 mg via ORAL
  Filled 2017-06-22: qty 2

## 2017-06-22 MED ORDER — AZITHROMYCIN 250 MG PO TABS: ORAL_TABLET | ORAL | 0 refills | Status: DC

## 2017-06-22 MED ORDER — AZITHROMYCIN 500 MG PO TABS
500.0000 mg | ORAL_TABLET | Freq: Once | ORAL | Status: AC
  Administered 2017-06-22: 500 mg via ORAL
  Filled 2017-06-22: qty 1

## 2017-06-22 NOTE — ED Notes (Addendum)
Pt ambulated around ER, oxygen began at 98%. 98% on RA for the entire walk. Pt in NAD. Pt does not have any coughing episodes. EDP informed.

## 2017-06-22 NOTE — ED Triage Notes (Addendum)
Pt reports cough and feeling short of breath for 1 1/2 weeks; pt says she has a history of "chronic bronchitis"; pt talking in complete coherent sentences; voice hoarse; using prescribed inhaler with little relief at home; pt says she came today as she is tired of feeling bad

## 2017-06-22 NOTE — ED Notes (Signed)
ED Provider at bedside. 

## 2017-06-22 NOTE — ED Notes (Signed)
Pt alert and oriented X4, active, cooperative, pt in NAD. RR even and unlabored, color WNL.  Pt informed to return if any life threatening symptoms occur.  Discharge and followup instructions reviewed.  

## 2017-06-22 NOTE — Discharge Instructions (Signed)
You were evaluated for trouble breathing and wheezing and are being treated for asthma exacerbation/bronchitis.  Use your albuterol inhaler every 4 hours as needed for wheezing.  Return to the ER for any worsening condition including trouble breathing, chest pain, dizziness, passing out, any altered mental status or any other symptoms concerning to you.

## 2017-06-22 NOTE — ED Provider Notes (Signed)
[facility] Emergency Department Provider Note ____________________________________________   I have reviewed the triage vital signs and the triage nursing note.  HISTORY  Chief Complaint Cough and Shortness of Breath   Historian Patient  HPI Janet Murray is a 47 y.o. female from home, with history of asthma, not recently on prednisone, reports thought she was getting a cold about a week and a half ago.  Over the last 3 or 4 days dyspnea has been worse along with wheezing.  She is tried her home albuterol.  No fevers.  Mild nausea, no vomiting.  No chest pain.  Mild chest tightness associated with wheezing/shortness of breath.  Coughing up dark sputum.  She does have chronic diarrhea, with a history of irritable bowel. Those are moderate.  Nothing makes it worse or better   Past Medical History:  Diagnosis Date  . Anxiety   . Arthritis   . Bronchitis   . Chronic pain   . Degenerative joint disease (DJD) of hip   . Foot fracture, left 05/02/15  . GERD (gastroesophageal reflux disease)   . Urinary incontinence     Patient Active Problem List   Diagnosis Date Noted  . Migraine 02/04/2016  . Lumbar radiculopathy 11/08/2014  . Hereditary and idiopathic peripheral neuropathy 11/06/2014  . 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 Facet syndrome, lumbar 10/25/2014  . Sacroiliac joint dysfunction of both sides 10/25/2014  . Carpal tunnel syndrome of left wrist 10/25/2014  . Occipital neuralgia 10/25/2014  . Restless leg syndrome 10/25/2014  . DDD (degenerative disc disease), lumbar 10/25/2014    Past Surgical History:  Procedure Laterality Date  . ABDOMINAL HYSTERECTOMY    . APPENDECTOMY    . BLADDER SURGERY    . CESAREAN SECTION      Prior to Admission medications   Medication Sig Start Date End Date Taking? Authorizing Provider  albuterol (PROVENTIL HFA;VENTOLIN HFA) 108 (90 BASE) MCG/ACT inhaler Inhale 2 puffs  into the lungs as needed for wheezing or shortness of breath.    [provider]  azithromycin (ZITHROMAX) 250 MG tablet One by mouth daily for 4 days 06/22/17   Lisa Roca, MD  budesonide-formoterol [facility]) 80-4.5 MCG/ACT inhaler Inhale 2 puffs into the lungs 2 (two) times daily. Reported on 12/03/2015    [provider]  esomeprazole (NEXIUM) 40 MG capsule Take 40 mg by mouth 2 (two) times daily.    [provider]  levothyroxine (SYNTHROID, LEVOTHROID) 50 MCG tablet Take 50 mcg by mouth daily.    [provider]  LORazepam (ATIVAN) 0.5 MG tablet Take 0.5 mg by mouth every 8 (eight) hours as needed for anxiety.    [provider]  meloxicam (MOBIC) 15 MG tablet Take 15 mg by mouth daily.    [provider]  montelukast (SINGULAIR) 10 MG tablet Take 10 mg by mouth at bedtime.    [provider]  ondansetron (ZOFRAN) 4 MG tablet Take 1 tablet (4 mg total) by mouth every 8 (eight) hours as needed for nausea or vomiting. 12/01/14   Paulette Blanch, MD  oxyCODONE-acetaminophen (PERCOCET/ROXICET) 5-325 MG tablet Limit one half to 1 tab by mouth per day 2-3 times per day if tolerated a day if tolerated 02/06/16   Mohammed Kindle, MD  polyethylene glycol (MIRALAX / Floria Raveling) packet Take 17 g by mouth daily. Mix one tablespoon with 8oz of your favorite juice or water every day until you are having soft formed stools. Then  start taking once daily if you didn't have a stool the day before. 03/24/17   Merlyn Lot, MD  pramipexole (MIRAPEX) 0.5 MG tablet Take 0.5 mg by mouth at bedtime.    [provider]  predniSONE (DELTASONE) 10 MG tablet 40mg  daily for 4 more days 06/22/17   Lisa Roca, MD  tiotropium (SPIRIVA) 18 MCG inhalation capsule Place 18 mcg into inhaler and inhale daily.    [provider]  tizanidine (ZANAFLEX) 2 MG capsule Limit 1 tab by mouth per day or 2 - 3 times a day if tolerated 02/06/16   Mohammed Kindle,  MD  topiramate (TOPAMAX) 25 MG tablet Take 25-50 mg by mouth daily.    [provider]  VESICARE 5 MG tablet TAKE 1 TABLET BY MOUTH EVERY DAY. 10/25/16   Gae Dry, MD    Allergies  Allergen Reactions  . Penicillins Hives and Swelling    Has patient had a PCN reaction causing immediate rash, facial/tongue/throat swelling, SOB or lightheadedness with hypotension: Yes Has patient had a PCN reaction causing severe rash involving mucus membranes or skin necrosis: No Has patient had a PCN reaction that required hospitalization No Has patient had a PCN reaction occurring within the last 10 years: No If all of the above answers are "NO", then may proceed with Cephalosporin use.  . Ciprofloxacin Itching  . Hydrocodone   . Aspirin Palpitations    Heart palpitations  . Tape Rash    bandaids    Family History  Problem Relation Age of Onset  . Arthritis Father   . Heart disease Father   . Hypertension Father     Social History Social History   Tobacco Use  . Smoking status: Current Every Day Smoker    Packs/day: 0.50    Years: 20.00    Pack years: 10.00    Types: Cigarettes  . Smokeless tobacco: Never Used  Substance Use Topics  . Alcohol use: Yes    Alcohol/week: 0.0 oz    Comment: occassionally  . Drug use: No    Review of Systems  Constitutional: Negative for fever. Eyes: Negative for visual changes. ENT: Negative for sore throat. Cardiovascular: Negative for chest pain. Respiratory: Positive for coughing and for shortness of breath. Gastrointestinal: Negative for abdominal pain, vomiting and diarrhea. Genitourinary: Negative for dysuria. Musculoskeletal: Negative for back pain. Skin: Negative for rash. Neurological: Negative for headache.  ____________________________________________   PHYSICAL EXAM:  VITAL SIGNS: ED Triage Vitals  Enc Vitals Group     BP 06/22/17 0531 116/70     Pulse Rate 06/22/17 0531 78     Resp 06/22/17 0531 15     Temp  06/22/17 0531 98.6 F (37 C)     Temp Source 06/22/17 0531 Oral     SpO2 06/22/17 0531 97 %     Weight 06/22/17 0532 180 lb (81.6 kg)     Height 06/22/17 0532 5\' 3"  (1.6 m)     Head Circumference --      Peak Flow --      Pain Score 06/22/17 0537 6     Pain Loc --      Pain Edu? --      Excl. in [place]? --      Constitutional: Alert and oriented. Well appearing and in no distress. HEENT   Head: Normocephalic and atraumatic.      Eyes: Conjunctivae are normal. Pupils equal and round.       Ears:  Nose: No congestion/rhinnorhea.   Mouth/Throat: Mucous membranes are moist.   Neck: No stridor. Cardiovascular/Chest: Normal rate, regular rhythm.  No murmurs, rubs, or gallops. Respiratory: Normal respiratory effort without tachypnea nor retractions. Breath sounds are clear and equal bilaterally.  At rest some decreased air movement throughout, when I asked her to take a big deep breath she has extremely bronchospastic fit. Gastrointestinal: Soft. No distention, no guarding, no rebound. Nontender.    Genitourinary/rectal:Deferred Musculoskeletal: Nontender with normal range of motion in all extremities. No joint effusions.  No lower extremity tenderness.  No edema. Neurologic:  Normal speech and language. No gross or focal neurologic deficits are appreciated. Skin:  Skin is warm, dry and intact. No rash noted. Psychiatric: Mood and affect are normal. Speech and behavior are normal. Patient exhibits appropriate insight and judgment.   ____________________________________________  LABS (pertinent positives/negatives) I, Lisa Roca, MD the attending physician have reviewed the labs noted below.  Labs Reviewed  BASIC METABOLIC PANEL - Abnormal; Notable for the following components:      Result Value   Potassium 3.1 (*)    Glucose, Bld 103 (*)    All other components within normal limits  CBC - Abnormal; Notable for the following components:   WBC 12.1 (*)    Platelets  117 (*)    All other components within normal limits  TROPONIN I    ____________________________________________    EKG I, Lisa Roca, MD, the attending physician have personally viewed and interpreted all ECGs.  79 bpm.  Normal sinus rhythm.  Incomplete right bundle branch block.   normal axis.  Nonspecific ST and T wave ____________________________________________  RADIOLOGY All Xrays were viewed by me.  Imaging interpreted by Radiologist, and I, Lisa Roca, MD the attending physician have reviewed the radiologist interpretation noted below.  Chest x-ray two-view:  IMPRESSION: Similar bronchitic changes with hyperinflation. No focal consolidation. __________________________________________  PROCEDURES  Procedure(s) performed: None  Critical Care performed: None   ____________________________________________  ED COURSE / ASSESSMENT AND PLAN  Pertinent labs & imaging results that were available during my care of the patient were reviewed by me and considered in my medical decision making (see chart for details).    Patient is overall well-appearing with no hypoxia at rest.  She does have wheezing and especially bronchospastic cough when she takes a deep breath.  Laboratory studies are reassuring, chest x-ray is reassuring without focal pneumonia.  Given smoking history and asthma exacerbation/bronchitis, I am going to cover her also with azithromycin.  No additional symptoms to be concerned about fluat this point.  Much improved after DuoNeb treatment.  Patient able to walk with no problem or desaturation.  Okay for outpatient management.  She has her albuterol inhaler.   DIFFERENTIAL DIAGNOSIS: Differential includes, but is not limited to, viral syndrome, bronchitis including COPD exacerbation, pneumonia, reactive airway disease including asthma, CHF including exacerbation with or without pulmonary/interstitial edema, pneumothorax, ACS, thoracic trauma, and  pulmonary embolism.  CONSULTATIONS:   None   Patient / Family / Caregiver informed of clinical course, medical decision-making process, and agree with plan.   I discussed return precautions, follow-up instructions, and discharge instructions with patient and/or family.  Discharge Instructions: You were evaluated for trouble breathing and wheezing and are being treated for asthma exacerbation/bronchitis.  Use your albuterol inhaler every 4 hours as needed for wheezing.  Return to the ER for any worsening condition including trouble breathing, chest pain, dizziness, passing out, any altered mental status or any other symptoms  concerning to you.    ___________________________________________   FINAL CLINICAL IMPRESSION(S) / ED DIAGNOSES   Final diagnoses:  Bronchitis  Hypokalemia  Exacerbation of asthma, unspecified asthma severity, unspecified whether persistent      ___________________________________________        Note: This dictation was prepared with Dragon dictation. Any transcriptional errors that result from this process are unintentional    Lisa Roca, MD 06/22/17 [phone]

## 2017-07-13 ENCOUNTER — Ambulatory Visit: Admit: 2017-07-13 | Payer: Medicare Other | Admitting: Unknown Physician Specialty

## 2017-07-13 SURGERY — EGD (ESOPHAGOGASTRODUODENOSCOPY)
Anesthesia: General

## 2017-07-20 DIAGNOSIS — J4489 Other specified chronic obstructive pulmonary disease: Secondary | ICD-10-CM | POA: Insufficient documentation

## 2017-07-20 DIAGNOSIS — J449 Chronic obstructive pulmonary disease, unspecified: Secondary | ICD-10-CM

## 2017-07-20 HISTORY — DX: Chronic obstructive pulmonary disease, unspecified: J44.9

## 2017-08-25 ENCOUNTER — Encounter: Payer: Self-pay | Admitting: Nurse Practitioner

## 2017-08-25 ENCOUNTER — Ambulatory Visit (INDEPENDENT_AMBULATORY_CARE_PROVIDER_SITE_OTHER): Payer: Medicare Other | Admitting: Nurse Practitioner

## 2017-08-25 ENCOUNTER — Other Ambulatory Visit: Payer: Self-pay

## 2017-08-25 VITALS — BP 125/82 | HR 72 | Temp 98.2°F | Resp 18 | Ht 62.75 in | Wt 192.4 lb

## 2017-08-25 DIAGNOSIS — N3281 Overactive bladder: Secondary | ICD-10-CM

## 2017-08-25 DIAGNOSIS — J4540 Moderate persistent asthma, uncomplicated: Secondary | ICD-10-CM | POA: Diagnosis not present

## 2017-08-25 DIAGNOSIS — J418 Mixed simple and mucopurulent chronic bronchitis: Secondary | ICD-10-CM

## 2017-08-25 DIAGNOSIS — F410 Panic disorder [episodic paroxysmal anxiety] without agoraphobia: Secondary | ICD-10-CM

## 2017-08-25 DIAGNOSIS — Z7689 Persons encountering health services in other specified circumstances: Secondary | ICD-10-CM | POA: Diagnosis not present

## 2017-08-25 DIAGNOSIS — K219 Gastro-esophageal reflux disease without esophagitis: Secondary | ICD-10-CM | POA: Diagnosis not present

## 2017-08-25 DIAGNOSIS — J301 Allergic rhinitis due to pollen: Secondary | ICD-10-CM | POA: Diagnosis not present

## 2017-08-25 MED ORDER — ALBUTEROL SULFATE HFA 108 (90 BASE) MCG/ACT IN AERS: 2.0000 | INHALATION_SPRAY | Freq: Four times a day (QID) | RESPIRATORY_TRACT | 5 refills | Status: DC | PRN

## 2017-08-25 MED ORDER — PAROXETINE HCL 10 MG PO TABS: 10.0000 mg | ORAL_TABLET | Freq: Every day | ORAL | 5 refills | Status: DC

## 2017-08-25 MED ORDER — ALBUTEROL SULFATE HFA 108 (90 BASE) MCG/ACT IN AERS: 2.0000 | INHALATION_SPRAY | RESPIRATORY_TRACT | 5 refills | Status: DC | PRN

## 2017-08-25 MED ORDER — SOLIFENACIN SUCCINATE 5 MG PO TABS: 5.0000 mg | ORAL_TABLET | Freq: Every day | ORAL | 3 refills | Status: DC

## 2017-08-25 MED ORDER — BUDESONIDE-FORMOTEROL FUMARATE 80-4.5 MCG/ACT IN AERO: 2.0000 | INHALATION_SPRAY | Freq: Two times a day (BID) | RESPIRATORY_TRACT | 5 refills | Status: DC

## 2017-08-25 MED ORDER — LORAZEPAM 0.5 MG PO TABS
0.5000 mg | ORAL_TABLET | Freq: Two times a day (BID) | ORAL | 2 refills | Status: DC | PRN
Start: 2017-08-25 — End: 2017-10-30

## 2017-08-25 MED ORDER — MONTELUKAST SODIUM 10 MG PO TABS: 10.0000 mg | ORAL_TABLET | Freq: Every day | ORAL | 3 refills | Status: DC

## 2017-08-25 MED ORDER — ESOMEPRAZOLE MAGNESIUM 40 MG PO CPDR: 40.0000 mg | DELAYED_RELEASE_CAPSULE | Freq: Two times a day (BID) | ORAL | 3 refills | Status: DC

## 2017-08-25 MED ORDER — FLUTICASONE PROPIONATE 50 MCG/ACT NA SUSP: 2.0000 | Freq: Every day | NASAL | 6 refills | Status: DC

## 2017-08-25 NOTE — Patient Instructions (Addendum)
Janet Murray, Thank you for coming in to clinic today.  1. Resume medications as previously prescribed.  2. START paroxetine 10 mg once daily for anxiety.  Hopefully, this will help you use your Xanax less often.  3. START claritin or Zyrtec for allergies with your Singulair. - START flonase 2 sprays in each nostril twice daily.  Please schedule a follow-up appointment with [name], AGNP. Return in about 6 months (around 02/25/2018) for Anxiety, Weight or Smoking.  If you have any other questions or concerns, please feel free to call the clinic or send a message through [place]. You may also schedule an earlier appointment if necessary.  You will receive a survey after today's visit either digitally by e-mail or paper by [employer]. Your experiences and feedback matter to [place].  Please respond so we know how we are doing as we provide care for you.   [name], DNP, AGNP-BC Adult Gerontology Nurse Practitioner [place]

## 2017-08-25 NOTE — Progress Notes (Signed)
Subjective:    Patient ID: Janet Murray, female    DOB: [birth year], 47 y.o.   MRN: [id number]  Janet Murray is a 47 y.o. female presenting on 08/25/2017 for Establish Care (pt transition care from [place]. recurrent bronchitis, asthma )   HPI Establish Care New Provider Pt last seen by PCP Dr. Brynda Greathouse about 9 months ago.  Obtain records.   - Pt also sees:  --Dr. Primus Bravo - [place] (voltaren, tizanidine, percocet)  --Dr. Bary Leriche - Q6 mos biopsy and med management. (synthroid)  Seasonal Allergies  Pt is presenting with nasal and sinus congestion, sneezing, runny nose, scratchy throat, watery eyes.  She is currently taking singulair and no other meds to control her symptoms.    - Symptoms of recurrent bronchitis and asthma are also worsening without adequate control of allergies.  She is having much more shortness of breath with exertion that occurs daily with activity greater than 5 minutes duration.   Social Anxiety Dr. Brynda Greathouse prescribed lorazepam - Takes 0.5 mg tablet twice daily.  Has anxiety more with people and in public.  She uses it twice daily most days and especially if going to public places.  She has never taken a daily maintenance medication for her symptoms and states she is willing to try as long as lorazepam is continued and she feels she does not need it.  She believes lorazepam is working well and she does not feel she needs a higher dose of it, but would like to be able to not have the panic in the first place.  Past Medical History:  Diagnosis Date  . Anxiety   . Arthritis   . Asthma   . Bronchitis   . Chronic pain   . COPD (chronic obstructive pulmonary disease) ([place])   . Degenerative joint disease (DJD) of hip   . Foot fracture, left 05/02/15  . GERD (gastroesophageal reflux disease)   . Urinary incontinence    Past Surgical History:  Procedure Laterality Date  . ABDOMINAL HYSTERECTOMY    . APPENDECTOMY    . BLADDER SURGERY     sling - Dr. Davis Gourd  .  CESAREAN SECTION     Social History   Socioeconomic History  . Marital status: Single    Spouse name: Not on file  . Number of children: Not on file  . Years of education: Not on file  . Highest education level: Not on file  Social Needs  . Financial resource strain: Not hard at all  . Food insecurity - worry: Never true  . Food insecurity - inability: Never true  . Transportation needs - medical: No  . Transportation needs - non-medical: No  Occupational History  . Not on file  Tobacco Use  . Smoking status: Current Every Day Smoker    Packs/day: 0.50    Years: 32.00    Pack years: 16.00    Types: Cigarettes  . Smokeless tobacco: Never Used  . Tobacco comment: Started smoking at age 62  Substance and Sexual Activity  . Alcohol use: Yes    Alcohol/week: 0.0 oz    Comment: occassionally  . Drug use: No  . Sexual activity: No  Other Topics Concern  . Not on file  Social History Narrative  . Not on file   Family History  Problem Relation Age of Onset  . Neuropathy Mother   . Arthritis Father   . Hypertension Father   . Leukemia Father   . Heart disease Father  CAD s/p CABG  . Healthy Brother   . Healthy Daughter   . Healthy Son   . Dementia Maternal Grandmother   . Hypertension Maternal Grandmother   . Hypercholesterolemia Maternal Grandmother   . Heart disease Maternal Grandmother   . Healthy Brother   . Healthy Brother   . Stroke Neg Hx   . Cancer Neg Hx    Current Outpatient Medications on File Prior to Visit  Medication Sig  . diclofenac sodium (VOLTAREN) 1 % GEL Apply topically.  Marland Kitchen levothyroxine (SYNTHROID, LEVOTHROID) 50 MCG tablet Take 50 mcg by mouth daily.  Marland Kitchen oxyCODONE-acetaminophen (PERCOCET/ROXICET) 5-325 MG tablet Limit one half to 1 tab by mouth per day 2-3 times per day if tolerated a day if tolerated (Patient taking differently: 1 tablet 2 (two) times daily. Limit one half to 1 tab by mouth per day 2-3 times per day if tolerated a day if  tolerated)  . polyethylene glycol (MIRALAX / GLYCOLAX) packet Take 17 g by mouth daily. Mix one tablespoon with 8oz of your favorite juice or water every day until you are having soft formed stools. Then start taking once daily if you didn't have a stool the day before.  . tizanidine (ZANAFLEX) 2 MG capsule Limit 1 tab by mouth per day or 2 - 3 times a day if tolerated   Review of Systems  Constitutional: Negative.   HENT: Positive for congestion, rhinorrhea and sinus pressure.   Eyes: Positive for itching.  Respiratory: Positive for shortness of breath and wheezing.   Cardiovascular: Negative.   Gastrointestinal: Negative.   Endocrine: Negative.   Genitourinary: Negative.   Musculoskeletal: Negative.   Skin: Negative.   Allergic/Immunologic: Negative.   Neurological: Negative.   Hematological: Negative.   Psychiatric/Behavioral: Negative for suicidal ideas. The patient is nervous/anxious.    Per HPI unless specifically indicated above     Objective:    BP 125/82 (BP Location: Right Arm, Patient Position: Sitting, Cuff Size: Normal)   Pulse 72   Temp 98.2 F (36.8 C) (Oral)   Resp 18   Ht 5' 2.75" (1.594 m)   Wt 192 lb 6.4 oz (87.3 kg)   SpO2 98%   BMI 34.35 kg/m   Wt Readings from Last 3 Encounters:  08/25/17 192 lb 6.4 oz (87.3 kg)  06/22/17 180 lb (81.6 kg)  03/24/17 182 lb (82.6 kg)    Physical Exam  General - obese, well-appearing, NAD HEENT - Normocephalic, atraumatic, TMs normal bilaterally, external ear and ear canal, normal patent nares w/o congestion or edema, sinuses nontender, oropharynx clear Neck - supple, non-tender, no LAD, no thyromegaly, no carotid bruit Heart - RRR, no murmurs heard Lungs - Clear throughout all lobes, no wheezing, crackles, or rhonchi. Normal work of breathing. Extremeties - non-tender, no edema, cap refill < 2 seconds, peripheral pulses intact +2 bilaterally Skin - warm, dry Neuro - awake, alert, oriented x3, normal gait Psych -  Anxious mood and affect, hyperactive and fidgeting behavior.  Participates normally in conversation.   Results for orders placed or performed during the hospital encounter of 14/43/15  Basic metabolic panel  Result Value Ref Range   Sodium 140 135 - 145 mmol/L   Potassium 3.1 (L) 3.5 - 5.1 mmol/L   Chloride 104 101 - 111 mmol/L   CO2 25 22 - 32 mmol/L   Glucose, Bld 103 (H) 65 - 99 mg/dL   BUN 7 6 - 20 mg/dL   Creatinine, Ser 0.69 0.44 - 1.00 mg/dL  Calcium 9.2 8.9 - 10.3 mg/dL   GFR calc non Af Amer >60 >60 mL/min   GFR calc Af Amer >60 >60 mL/min   Anion gap 11 5 - 15  CBC  Result Value Ref Range   WBC 12.1 (H) 3.6 - 11.0 K/uL   RBC 4.22 3.80 - 5.20 MIL/uL   Hemoglobin 13.7 12.0 - 16.0 g/dL   HCT 41.2 35.0 - 47.0 %   MCV 97.7 80.0 - 100.0 fL   MCH 32.6 26.0 - 34.0 pg   MCHC 33.4 32.0 - 36.0 g/dL   RDW 12.8 11.5 - 14.5 %   Platelets 117 (L) 150 - 440 K/uL  Troponin I  Result Value Ref Range   Troponin I <0.03 <0.03 ng/mL      Assessment & Plan:   Problem List Items Addressed This Visit    None    Visit Diagnoses    Seasonal allergic rhinitis due to pollen    -  Primary Consistent with chronic allergic rhinitis, non-seasonal, unknown triggers. - Today no evidence of acute sinusitis or complication.  - Has not recently used loratadine, cetirizine, flonase, but has been taking Singulair.  Plan: 1. Start nasal fluticasone 2 sprays each nostril once daily for 4 weeks. 2. START antihistamine cetirizine 10 mg once daily. 3. Continue Singulair. 4. Follow-up in future, as needed consider 2nd opinion from ENT/allergy.   Relevant Medications   fluticasone (FLONASE) 50 MCG/ACT nasal spray   montelukast (SINGULAIR) 10 MG tablet   Mixed simple and mucopurulent chronic bronchitis (HCC)     Moderate persistent asthma without complication     Chronic COPD and asthma with no daily maintenance medications.  Patient currently has shortness of breath daily and has not been using any  albuterol because she does not have any.  No recent PFTs available.  Plan: 1.  Start albuterol 1-2 puffs every 6 hours as needed for wheezing or shortness of breath. 2.  Start Symbicort 2 puffs twice daily 3.  Continue Singulair 4.  Consider PFTs in future if symptoms are worsening.  May also need to consider referral to pulmonology. 5.  Follow-up every 3 months.      Relevant Medications   montelukast (SINGULAIR) 10 MG tablet   budesonide-formoterol (SYMBICORT) 80-4.5 MCG/ACT inhaler   albuterol (PROVENTIL HFA;VENTOLIN HFA) 108 (90 Base) MCG/ACT inhaler   Panic disorder (episodic paroxysmal anxiety)     Social anxiety and panic disorder is currently poorly controlled with only episodic therapy on lorazepam 0.5 mg twice daily as needed for anxiety.  Patient does not have maintenance anxiety medication.  Plan: 1.  Discussed rescue versus maintenance medications for panic disorder.  Patient agrees she would prefer to have better baseline control of anxiety without need for lorazepam. 2.  Start Paxil 10 mg once daily 3.  Continue Lorazepam 0.5 mg twice daily as needed. 4.  Follow-up 6 weeks.   Relevant Medications   LORazepam (ATIVAN) 0.5 MG tablet   PARoxetine (PAXIL) 10 MG tablet   OAB (overactive bladder)     Patient is stable on OAB medication Vesicare.  She notes significant improvement since starting it.  Requests refill today.  Follow-up at next visit with detailed review.   Relevant Medications   solifenacin (VESICARE) 5 MG tablet   Gastroesophageal reflux disease, esophagitis presence not specified     Patient is stable on current medication Nexium.  She notes no breakthrough reflux symptoms when taking daily.  Requests refill today.  Follow-up at next visit  with detailed review.   Relevant Medications   esomeprazole (NEXIUM) 40 MG capsule   Encounter to establish care     Previous PCP was at Dr. Jerene Dilling office.  Records will be requested.  Past medical, family, and surgical  history reviewed w/ pt in clinic today.       Meds ordered this encounter  Medications  . fluticasone (FLONASE) 50 MCG/ACT nasal spray    Sig: Place 2 sprays into both nostrils daily.    Dispense:  16 g    Refill:  6    Order Specific Question:   Supervising Provider    Answer:   Olin Hauser [[id number]]  . LORazepam (ATIVAN) 0.5 MG tablet    Sig: Take 1 tablet (0.5 mg total) by mouth 2 (two) times daily as needed for anxiety.    Dispense:  60 tablet    Refill:  2    Order Specific Question:   Supervising Provider    Answer:   Olin Hauser [[id number]]  . montelukast (SINGULAIR) 10 MG tablet    Sig: Take 1 tablet (10 mg total) by mouth at bedtime.    Dispense:  90 tablet    Refill:  3    Order Specific Question:   Supervising Provider    Answer:   Olin Hauser [[id number]]  . esomeprazole (NEXIUM) 40 MG capsule    Sig: Take 1 capsule (40 mg total) by mouth 2 (two) times daily.    Dispense:  90 capsule    Refill:  3    Order Specific Question:   Supervising Provider    Answer:   Olin Hauser [[id number]]  . solifenacin (VESICARE) 5 MG tablet    Sig: Take 1 tablet (5 mg total) by mouth daily.    Dispense:  90 tablet    Refill:  3    Order Specific Question:   Supervising Provider    Answer:   Olin Hauser [[id number]]  . budesonide-formoterol (SYMBICORT) 80-4.5 MCG/ACT inhaler    Sig: Inhale 2 puffs into the lungs 2 (two) times daily. Reported on 12/03/2015    Dispense:  1 Inhaler    Refill:  5    Order Specific Question:   Supervising Provider    Answer:   Olin Hauser [[id number]]  . DISCONTD: albuterol (PROVENTIL HFA;VENTOLIN HFA) 108 (90 Base) MCG/ACT inhaler    Sig: Inhale 2 puffs into the lungs as needed for wheezing or shortness of breath.    Dispense:  1 Inhaler    Refill:  5    Order Specific Question:   Supervising Provider    Answer:   Olin Hauser [[id number]]  . PARoxetine (PAXIL) 10 MG tablet    Sig: Take 1 tablet  (10 mg total) by mouth daily.    Dispense:  30 tablet    Refill:  5    Order Specific Question:   Supervising Provider    Answer:   Olin Hauser [[id number]]  . albuterol (PROVENTIL HFA;VENTOLIN HFA) 108 (90 Base) MCG/ACT inhaler    Sig: Inhale 2 puffs into the lungs every 6 (six) hours as needed for wheezing or shortness of breath.    Dispense:  1 Inhaler    Refill:  5    Order Specific Question:   Supervising Provider    Answer:   Olin Hauser [[id number]]      Follow up plan: Return in about 6 months (around 02/25/2018) for Anxiety, Weight or Smoking.  [name],  DNP, AGPCNP-BC Adult Gerontology Primary Care Nurse Practitioner [facility] 08/26/2017, 6:03 PM

## 2017-08-26 ENCOUNTER — Encounter: Payer: Self-pay | Admitting: Nurse Practitioner

## 2017-09-04 ENCOUNTER — Telehealth: Payer: Self-pay

## 2017-09-04 NOTE — Telephone Encounter (Signed)
I contacted Dr. Ronnald Collum office and left a detail message requesting that they return my call to explain what was needed from our office for the patient to be seen.

## 2017-09-04 NOTE — Telephone Encounter (Signed)
Please request old records from Dr. Ronnald Collum and Dr Brynda Greathouse.

## 2017-09-04 NOTE — Telephone Encounter (Signed)
Pt called requesting a referral to Dr. Lenard Simmer , MD Endocrinologist @ [facility] [phone] for thyroid nodules.

## 2017-09-07 NOTE — Telephone Encounter (Signed)
I contacted Dr. Ronnald Collum office and spoke w/ [name] who informed me that she explained to Janet Murray when she contacted them for an appointment last week.  That the last time she was seen at there office was 2 years ago and she needs a new referral. I attempted to contact [name], no answer. I left a detail message for the patient to call and schedule an f/u appt.

## 2017-09-21 ENCOUNTER — Ambulatory Visit: Payer: Medicare Other | Admitting: Nurse Practitioner

## 2017-09-23 ENCOUNTER — Ambulatory Visit
Admission: RE | Admit: 2017-09-23 | Payer: Medicare Other | Source: Ambulatory Visit | Admitting: Unknown Physician Specialty

## 2017-09-23 ENCOUNTER — Encounter: Admission: RE | Payer: Self-pay | Source: Ambulatory Visit

## 2017-09-23 HISTORY — DX: Low back pain, unspecified: M54.50

## 2017-09-23 HISTORY — DX: Dysphagia, unspecified: R13.10

## 2017-09-23 HISTORY — DX: Spondylosis without myelopathy or radiculopathy, lumbar region: M47.816

## 2017-09-23 HISTORY — DX: Right upper quadrant abdominal swelling, mass and lump: R19.01

## 2017-09-23 HISTORY — DX: Other intervertebral disc degeneration, lumbar region: M51.36

## 2017-09-23 HISTORY — DX: Occipital neuralgia: M54.81

## 2017-09-23 HISTORY — DX: Irritable bowel syndrome, unspecified: K58.9

## 2017-09-23 HISTORY — DX: Low back pain: M54.5

## 2017-09-23 HISTORY — DX: Other intervertebral disc degeneration, lumbar region without mention of lumbar back pain or lower extremity pain: M51.369

## 2017-09-23 HISTORY — DX: Restless legs syndrome: G25.81

## 2017-09-23 SURGERY — COLONOSCOPY WITH PROPOFOL
Anesthesia: General

## 2017-10-08 ENCOUNTER — Other Ambulatory Visit: Payer: Self-pay | Admitting: Nurse Practitioner

## 2017-10-08 DIAGNOSIS — R1011 Right upper quadrant pain: Secondary | ICD-10-CM

## 2017-10-08 DIAGNOSIS — K219 Gastro-esophageal reflux disease without esophagitis: Secondary | ICD-10-CM

## 2017-10-09 ENCOUNTER — Telehealth: Payer: Self-pay

## 2017-10-09 DIAGNOSIS — J4540 Moderate persistent asthma, uncomplicated: Secondary | ICD-10-CM

## 2017-10-09 DIAGNOSIS — J301 Allergic rhinitis due to pollen: Secondary | ICD-10-CM

## 2017-10-09 DIAGNOSIS — J418 Mixed simple and mucopurulent chronic bronchitis: Secondary | ICD-10-CM

## 2017-10-09 NOTE — Telephone Encounter (Signed)
Natalie from [facility] called regarding patient concerned about her chronic SOB and cough. Wanted to get clearance and need PFT before proceeding for her coloscopy and upper endoscopy. [facility] # [id number].

## 2017-10-10 DIAGNOSIS — R1011 Right upper quadrant pain: Secondary | ICD-10-CM | POA: Insufficient documentation

## 2017-10-14 NOTE — Telephone Encounter (Signed)
Please obtain PFTs and schedule visit with me after these are obtained (approx 1.5-2 weeks).

## 2017-10-14 NOTE — Addendum Note (Signed)
Addended by: Cleaster Corin on: 10/14/2017 09:06 AM   Modules accepted: Orders

## 2017-10-15 NOTE — Telephone Encounter (Signed)
Attempted to contact the pt, no answer. LMOM to return my call.  

## 2017-10-19 ENCOUNTER — Other Ambulatory Visit: Payer: Self-pay

## 2017-10-19 ENCOUNTER — Other Ambulatory Visit: Payer: Self-pay | Admitting: Nurse Practitioner

## 2017-10-19 DIAGNOSIS — J4540 Moderate persistent asthma, uncomplicated: Secondary | ICD-10-CM

## 2017-10-19 DIAGNOSIS — J418 Mixed simple and mucopurulent chronic bronchitis: Secondary | ICD-10-CM

## 2017-10-20 NOTE — Telephone Encounter (Signed)
Attempted to contact the pt. No answer. I left a very detail message on the vm about her scheduled PFT appointment on Thursday, May 23rd @ 9:30am. The pt is asked to arrive at [facility] 15 minutes early and to check in at the [facility] Desk.

## 2017-10-29 ENCOUNTER — Ambulatory Visit: Payer: Medicare Other | Attending: Nurse Practitioner

## 2017-10-29 DIAGNOSIS — J4540 Moderate persistent asthma, uncomplicated: Secondary | ICD-10-CM

## 2017-10-29 DIAGNOSIS — J418 Mixed simple and mucopurulent chronic bronchitis: Secondary | ICD-10-CM | POA: Diagnosis present

## 2017-10-29 MED ORDER — ALBUTEROL SULFATE (2.5 MG/3ML) 0.083% IN NEBU
2.5000 mg | INHALATION_SOLUTION | Freq: Once | RESPIRATORY_TRACT | Status: AC
  Administered 2017-10-29: 2.5 mg via RESPIRATORY_TRACT
  Filled 2017-10-29: qty 3

## 2017-10-30 ENCOUNTER — Ambulatory Visit (INDEPENDENT_AMBULATORY_CARE_PROVIDER_SITE_OTHER): Payer: Medicare Other | Admitting: Nurse Practitioner

## 2017-10-30 ENCOUNTER — Ambulatory Visit: Payer: Medicare Other | Admitting: Nurse Practitioner

## 2017-10-30 VITALS — BP 120/60 | HR 71 | Resp 15 | Ht 63.0 in | Wt 192.8 lb

## 2017-10-30 DIAGNOSIS — M25561 Pain in right knee: Secondary | ICD-10-CM | POA: Diagnosis not present

## 2017-10-30 DIAGNOSIS — F410 Panic disorder [episodic paroxysmal anxiety] without agoraphobia: Secondary | ICD-10-CM | POA: Diagnosis not present

## 2017-10-30 DIAGNOSIS — K581 Irritable bowel syndrome with constipation: Secondary | ICD-10-CM

## 2017-10-30 DIAGNOSIS — M25562 Pain in left knee: Secondary | ICD-10-CM | POA: Diagnosis not present

## 2017-10-30 DIAGNOSIS — J418 Mixed simple and mucopurulent chronic bronchitis: Secondary | ICD-10-CM

## 2017-10-30 DIAGNOSIS — E039 Hypothyroidism, unspecified: Secondary | ICD-10-CM

## 2017-10-30 DIAGNOSIS — E042 Nontoxic multinodular goiter: Secondary | ICD-10-CM

## 2017-10-30 DIAGNOSIS — M25462 Effusion, left knee: Secondary | ICD-10-CM

## 2017-10-30 DIAGNOSIS — M25461 Effusion, right knee: Secondary | ICD-10-CM | POA: Diagnosis not present

## 2017-10-30 MED ORDER — UMECLIDINIUM-VILANTEROL 62.5-25 MCG/INH IN AEPB
1.0000 | INHALATION_SPRAY | Freq: Every day | RESPIRATORY_TRACT | 5 refills | Status: DC
Start: 2017-10-30 — End: 2018-04-08

## 2017-10-30 MED ORDER — POLYETHYLENE GLYCOL 3350 17 G PO PACK: 17.0000 g | PACK | Freq: Every day | ORAL | 0 refills | Status: DC

## 2017-10-30 MED ORDER — IPRATROPIUM-ALBUTEROL 0.5-2.5 (3) MG/3ML IN SOLN: 3.0000 mL | Freq: Four times a day (QID) | RESPIRATORY_TRACT | 0 refills | Status: DC | PRN

## 2017-10-30 MED ORDER — LEVOTHYROXINE SODIUM 50 MCG PO TABS: 50.0000 ug | ORAL_TABLET | Freq: Every day | ORAL | 2 refills | Status: DC

## 2017-10-30 MED ORDER — HYDROXYZINE HCL 25 MG PO TABS: 25.0000 mg | ORAL_TABLET | Freq: Three times a day (TID) | ORAL | 5 refills | Status: DC | PRN

## 2017-10-30 MED ORDER — PREDNISONE 10 MG PO TABS: ORAL_TABLET | ORAL | 0 refills | Status: DC

## 2017-10-30 MED ORDER — POLYETHYLENE GLYCOL 3350 17 G PO PACK
17.0000 g | PACK | Freq: Every day | ORAL | 2 refills | Status: DC
Start: 2017-10-30 — End: 2019-07-12

## 2017-10-30 NOTE — Progress Notes (Signed)
Subjective:    Patient ID: Janet Murray, female    DOB: 1971/03/19, 47 y.o.   MRN: [id number]  FREDDA CLARIDA is a 47 y.o. female presenting on 10/30/2017 for Hypothyroidism (Follow up ) and Knee Pain (Bilateral knees swelling and pain for 1 week)   HPI Hypothyroidism Has been off levothyroxine x 6 weeks.  Was previously 50 mcg daily.  Is continuing to gain weight and requests to restart levothyroxine.  No recent TSH has been checked. - Patient has previously been diagnosed and managed by Dr. Ronnald Collum for hypothyroidism and thyroid nodules.  She requests referral to continue follow-up for Medicaid. - She denies, fatigue, excess energy, heart racing, heart palpitations, heat and cold intolerance, changes in hair/skin/nails, and lower leg swelling.  - She does not have any compressive symptoms to include difficulty swallowing, globus sensation, or difficulty breathing when lying flat.   Anxiety Patient has been taking Ativan for panic and acute anxiety.  She is currently being managed for chronic pain by Dr. Primus Bravo [facility] with oxycodone.  Dr. Primus Bravo is established she will not be able to continue her oxycodone if she continues on Ativan.  Patient desires to discuss additional acute anxiety treatment today.  Patient experiences acute anxiety as overwhelming and paralyzing.  She takes Xanax as needed when this occurs, usually 1-3 times daily.  Bilateral knee pain Bilateral knee pain with sharp pains radiating down to ankle.  Some numbness in feet.  She recalls no acute injury.  She has not been participating in conservative therapies at home other than rest.   Social History   Tobacco Use  . Smoking status: Current Every Day Smoker    Packs/day: 0.50    Years: 32.00    Pack years: 16.00    Types: Cigarettes  . Smokeless tobacco: Never Used  . Tobacco comment: Started smoking at age 51  Substance Use Topics  . Alcohol use: Yes    Alcohol/week: 0.0 oz    Comment: occassionally  .  Drug use: No    Review of Systems  Respiratory: Positive for shortness of breath and wheezing.   Gastrointestinal: Positive for constipation.   Per HPI unless specifically indicated above     Objective:    BP 120/60 (BP Location: Right Arm, Patient Position: Sitting, Cuff Size: Normal)   Pulse 71   Resp 15   Ht 5\' 3"  (1.6 m)   Wt 192 lb 12.8 oz (87.5 kg)   BMI 34.15 kg/m   Wt Readings from Last 3 Encounters:  10/30/17 192 lb 12.8 oz (87.5 kg)  08/25/17 192 lb 6.4 oz (87.3 kg)  06/22/17 180 lb (81.6 kg)    Physical Exam  Constitutional: She is oriented to person, place, and time. She appears well-developed and well-nourished. No distress.  HENT:  Head: Normocephalic and atraumatic.  Neck: Normal range of motion. No thyromegaly present.  Cardiovascular: Normal rate, regular rhythm, S1 normal, S2 normal, normal heart sounds and intact distal pulses.  Pulmonary/Chest: Effort normal. No respiratory distress. She has wheezes (Throughout lungs).  Musculoskeletal:  Bilateral Knees Inspection: Normal appearance and symmetrical. Mild effusion.  No ecchymosis. Palpation: Non-tender. Mild +TTP bilaterally medial/lateral joint line. Moderate crepitus ROM: Full active ROM bilaterally Special Testing: Lachman / Valgus/Varus tests negative with intact ligaments (ACL, MCL, LCL). McMurray negative without meniscus symptoms. Strength: 5/5 intact knee flex/ext, ankle dorsi/plantarflex Neurovascular: distally intact sensation light touch and pulses   Neurological: She is alert and oriented to person, place, and time.  Skin: Skin is warm and dry. Capillary refill takes less than 2 seconds.  Psychiatric: She has a normal mood and affect. Her behavior is normal. Judgment and thought content normal.  Vitals reviewed.   Results for orders placed or performed during the hospital encounter of 21/30/86  Basic metabolic panel  Result Value Ref Range   Sodium 140 135 - 145 mmol/L   Potassium 3.1  (L) 3.5 - 5.1 mmol/L   Chloride 104 101 - 111 mmol/L   CO2 25 22 - 32 mmol/L   Glucose, Bld 103 (H) 65 - 99 mg/dL   BUN 7 6 - 20 mg/dL   Creatinine, Ser 0.69 0.44 - 1.00 mg/dL   Calcium 9.2 8.9 - 10.3 mg/dL   GFR calc non Af Amer >60 >60 mL/min   GFR calc Af Amer >60 >60 mL/min   Anion gap 11 5 - 15  CBC  Result Value Ref Range   WBC 12.1 (H) 3.6 - 11.0 K/uL   RBC 4.22 3.80 - 5.20 MIL/uL   Hemoglobin 13.7 12.0 - 16.0 g/dL   HCT 41.2 35.0 - 47.0 %   MCV 97.7 80.0 - 100.0 fL   MCH 32.6 26.0 - 34.0 pg   MCHC 33.4 32.0 - 36.0 g/dL   RDW 12.8 11.5 - 14.5 %   Platelets 117 (L) 150 - 440 K/uL  Troponin I  Result Value Ref Range   Troponin I <0.03 <0.03 ng/mL      Assessment & Plan:   Problem List Items Addressed This Visit    None    Visit Diagnoses    Multiple thyroid nodules    -  Primary   Relevant Medications   levothyroxine (SYNTHROID, LEVOTHROID) 50 MCG tablet   Other Relevant Orders   TSH (Completed)   TSH   Ambulatory referral to Endocrinology   Acquired hypothyroidism     For thyroid nodules and acquired hypothyroidism, patient previously stable but not on medications.  Resume levothyroxine 50 mcg once daily first thing in the morning.  Draw labs today.  Referral to endocrinology with Dr. Ronnald Collum.  Follow-up 6 weeks with repeat TSH after resuming levothyroxine.   Relevant Medications   levothyroxine (SYNTHROID, LEVOTHROID) 50 MCG tablet   Other Relevant Orders   TSH (Completed)   TSH   Mixed simple and mucopurulent chronic bronchitis (HCC)     Acute worsening of shortness of breath.  Patient regularly wheezes and has had difficulty controlling symptoms.  Albuterol use is daily and multiple times daily.  Suboptimal maintenance medications.  Plan: 1.  Start Anoro 1 inhalation daily 2.  Continue DuoNeb refill provided 3.  Continue albuterol only as needed 4.  Referral to pulmonology.  Offered PFTs, but patient declines today. 5.  Follow-up as needed and in 3  months.   Relevant Medications   umeclidinium-vilanterol (ANORO ELLIPTA) 62.5-25 MCG/INH AEPB   ipratropium-albuterol (DUONEB) 0.5-2.5 (3) MG/3ML SOLN   Other Relevant Orders   Ambulatory referral to Pulmonology   Acute pain of both knees     Subacute on chronic bilateral generalized knee pain and swelling without known injury or trauma. Suspected likely due to underlying osteoarthritis / DJD with known OA/DJD in other joints. - No evidence of meniscus injury today - Able to bear weight, no knee instability, mechanical locking - No prior history of knee surgery, arthroscopy - Inadequate conservative therapy   Plan: 1. Start prednisone taper over 7 days Day 1-2: 60 mg, Day 3: 50 mg, Day 4: 40  mg; Day 5: 30 mg; Day 6 20 mg; Day 7: 10 mg then stop. 2. Start Tylenol 500-1000mg  per dose TID PRN breakthrough 3. RICE therapy (rest, ice, compression, elevation) for swelling, activity modification 4. Knee x-rays ordered to rule out effusion and confirm arthritis  5. Follow-up 4 weeks, if still worsening, consider steroid injection and referral to Ortho for further eval   Relevant Medications   predniSONE (DELTASONE) 10 MG tablet   Other Relevant Orders   DG Knee Complete 4 Views Left (Completed)   DG Knee Complete 4 Views Right (Completed)   Effusion of both knee joints     Mild effusion noted on exam see plan as above   Relevant Medications   predniSONE (DELTASONE) 10 MG tablet   Other Relevant Orders   DG Knee Complete 4 Views Left (Completed)   DG Knee Complete 4 Views Right (Completed)   Irritable bowel syndrome with constipation     Patient with chronic constipation and  abdominal discomfort.  Abdominal exam normal today.  Patient not currently using any medications.  Start with MiraLAX 1 dose daily.  Consider GI referral in future if not effective.  Follow-up as needed and in 3 months.   Relevant Medications   polyethylene glycol (MIRALAX / GLYCOLAX) packet   Panic attack     Patient  with chronic panic attacks and acute anxiety.  Previously managed on Xanax.  Need to discontinue benzo for opioid pain management.  Using discussed options for care patient prefers to keep episodic relief of anxiety.  Start hydroxyzine 25 mg 3 times daily as needed for anxiety and panic attack.  Follow-up 3 months or sooner if needed   Relevant Medications   hydrOXYzine (ATARAX/VISTARIL) 25 MG tablet      Meds ordered this encounter  Medications  . levothyroxine (SYNTHROID, LEVOTHROID) 50 MCG tablet    Sig: Take 1 tablet (50 mcg total) by mouth daily before breakfast.    Dispense:  30 tablet    Refill:  2    Order Specific Question:   Supervising Provider    Answer:   Olin Hauser [[id number]]  . umeclidinium-vilanterol (ANORO ELLIPTA) 62.5-25 MCG/INH AEPB    Sig: Inhale 1 puff into the lungs daily.    Dispense:  28 each    Refill:  5    Order Specific Question:   Supervising Provider    Answer:   Olin Hauser [[id number]]  . ipratropium-albuterol (DUONEB) 0.5-2.5 (3) MG/3ML SOLN    Sig: Take 3 mLs by nebulization every 6 (six) hours as needed (shortness of breath and wheezing).    Dispense:  360 mL    Refill:  0    Order Specific Question:   Supervising Provider    Answer:   Olin Hauser [[id number]]  . predniSONE (DELTASONE) 10 MG tablet    Sig: Day 1-2 take 6 pills. Day 3 take 5 pills then reduce by 1 pill each day.    Dispense:  27 tablet    Refill:  0    Order Specific Question:   Supervising Provider    Answer:   Olin Hauser [[id number]]  . DISCONTD: polyethylene glycol (MIRALAX / GLYCOLAX) packet    Sig: Take 17 g by mouth daily. Mix one tablespoon with 8oz of your favorite juice or water every day until you are having soft formed stools. Then start taking once daily if you didn't have a stool the day before.    Dispense:  30 each  Refill:  0    Order Specific Question:   Supervising Provider    Answer:   Olin Hauser [[id number]]  .  hydrOXYzine (ATARAX/VISTARIL) 25 MG tablet    Sig: Take 1 tablet (25 mg total) by mouth 3 (three) times daily as needed.    Dispense:  30 tablet    Refill:  5    Order Specific Question:   Supervising Provider    Answer:   Olin Hauser [[id number]]  . polyethylene glycol (MIRALAX / GLYCOLAX) packet    Sig: Take 17 g by mouth daily. Mix one tablespoon with 8oz of your favorite juice or water once daily if you didn't have a stool the day before.    Dispense:  30 each    Refill:  2    Order Specific Question:   Supervising Provider    Answer:   Olin Hauser [[id number]]    Follow up plan: Return in about 3 months (around 01/30/2018), or 4 weeks for knees if symptoms worsen or fail to improve, for chronic disease management.  A total of 40 minutes was spent face-to-face with this patient. Greater than 50% of this time was spent in counseling and coordination of care with the patient regarding multiple medications as above in A/P.   [name], DNP, AGPCNP-BC Adult Gerontology Primary Care Nurse Practitioner [facility] 10/30/2017, 1:51 PM

## 2017-10-30 NOTE — Patient Instructions (Addendum)
Janet Murray,   Thank you for coming in to clinic today.  1. Knee pain: Take prednisone taper 10 mg tablets Day 1 (Today): Take 6 pills at one time Day 2: Take 6 pills  Day 3: Take 5 pills Day 4: Take 4 pills Day 5: Take 3 pills Day 6: Take 2 pills Day 7: Take 1 pill then stop.  - Start taking Tylenol extra strength 1 to 2 tablets every 6-8 hours for aches or fever/chills for next few days as needed.  Do not take more than 3,000 mg in 24 hours from all medicines.  After prednisone: may take Ibuprofen as well if tolerated 200-400mg  every 8 hours as needed. May alternate tylenol and ibuprofen in same day. - Use heat and ice.  Apply this for 15 minutes at a time 6-8 times per day.   - Muscle rub with lidocaine, lidocaine patch, Biofreeze, or tiger balm for topical pain relief.  Avoid using this with heat and ice to avoid burns.  2. For breathing:  - Continue Albuterol inhaler as needed.  If not getting better, use nebulizer with DuoNeb up to 4 times daily. - START anoro inhaler once daily.  This is a powder.  Keep tongue flat and take a full, big breath. Hold for 2 seconds, then let it out. - Referral to pulmonology.  3. You will be due for BLOOD WORK.   - Please go ahead and schedule a "Lab Only" visit in the morning at the clinic for lab draw in the next 7 days. - Your results will be available about 2-3 days after blood draw.  If you have set up a MyChart account, you can can log in to MyChart online to view your results and a brief explanation. Also, we can discuss your results together at your next office visit if you would like.  4. Xrays Tuesday.  5. Anxiety: use hydroxyzine 25 mg tablet as needed for panic attack.  6. Referral to Dr. Ronnald Collum - Restart levothyroxine 50 mcg once daily.   Please schedule a follow-up appointment with [name], AGNP. Return in about 3 months (around 01/30/2018), or 4 weeks for knees if symptoms worsen or fail to improve, for chronic disease  management.  If you have any other questions or concerns, please feel free to call the clinic or send a message through [place]. You may also schedule an earlier appointment if necessary.  You will receive a survey after today's visit either digitally by e-mail or paper by [employer]. Your experiences and feedback matter to [place].  Please respond so we know how we are doing as we provide care for you.   [name], DNP, AGNP-BC Adult Gerontology Nurse Practitioner [place]

## 2017-11-03 ENCOUNTER — Ambulatory Visit
Admission: RE | Admit: 2017-11-03 | Discharge: 2017-11-03 | Disposition: A | Discharge status: Home / Self Care | Payer: Medicare Other | Source: Ambulatory Visit | Attending: Nurse Practitioner | Admitting: Nurse Practitioner

## 2017-11-03 ENCOUNTER — Ambulatory Visit: Payer: Medicare Other | Admitting: Nurse Practitioner

## 2017-11-03 ENCOUNTER — Inpatient Hospital Stay: Admit: 2017-11-03 | Payer: Self-pay

## 2017-11-03 ENCOUNTER — Other Ambulatory Visit: Payer: Medicare Other

## 2017-11-03 DIAGNOSIS — M25562 Pain in left knee: Secondary | ICD-10-CM | POA: Insufficient documentation

## 2017-11-03 DIAGNOSIS — M25561 Pain in right knee: Secondary | ICD-10-CM

## 2017-11-03 DIAGNOSIS — M25461 Effusion, right knee: Secondary | ICD-10-CM

## 2017-11-03 DIAGNOSIS — M25462 Effusion, left knee: Secondary | ICD-10-CM

## 2017-11-04 LAB — TSH: TSH: 3.91 mIU/L

## 2017-11-11 ENCOUNTER — Telehealth: Payer: Self-pay | Admitting: Nurse Practitioner

## 2017-11-11 NOTE — Telephone Encounter (Signed)
PFT results were faxed over.

## 2017-11-11 NOTE — Telephone Encounter (Signed)
Natalie with [facility] needs result of pulmonary function faxed to [phone].  Her procedure  Is scheduled for 6/11.

## 2017-11-18 ENCOUNTER — Encounter: Payer: Self-pay | Admitting: Nurse Practitioner

## 2017-12-04 NOTE — Progress Notes (Signed)
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 - Status post PFT which showed FEV1 of 43%, consistent with severe emphysema. - Discussed that the best thing she can do to prevent first third decline is smoking cessation. - Mild peripheral eosinophilia suggests that she may benefit from an inhaled steroid.  Is with switch from Anoro to Trilogy inhaler. - Patient currently sleeps with 5 pets in the bedroom, suggest that she remove pets from the bedroom. -I have also represcribed her nebulizer which has broken.  Nicotine abuse. - Spent 3 minutes in discussion, patient has not yet ready to quit.  GERD. - Currently controlled with Nexium twice daily.  Continue Nexium and behavioral modifications, as well as sleeping inclined.  Orders Placed This Encounter  Procedures  . Ambulatory referral to Respiratory Therapy  . Ambulatory Referral for DME   Meds ordered this encounter  Medications  . Fluticasone-Umeclidin-Vilant (TRELEGY ELLIPTA) 100-62.5-25 MCG/INH AEPB    Sig: Inhale 1 puff into the lungs daily.    Dispense:  1 each    Refill:  5   Return in about 6 months (around 06/09/2018).   Date: 12/07/2017  MRN# [id number] Janet Murray 01/06/1971    Janet Murray is a 47 y.o. old female seen in consultation for chief complaint of:    Chief Complaint  Patient presents with  . Consult    Referred by Janet Murray for chronic bronchits  . Shortness of Breath    with exertion and nighttime.  . Wheezing  . Cough    smoker's cough with clear mucus    HPI:   She has been having dyspnea with coughing and wheezing, the dyspnea wakes her from sleep with coughing. She is smoking quarter ppd down from half ppd. Her family at home smoke. She has been smoking for about 30 years, she smoked as much as a ppd.  She is on disability from back pain, she was previously working as a Scientist, research (life sciences).  Currently she gets  winded with walking in from parking. She can work constantly for about 15 min but then will have to stop and rest. Breathing is worse when it is hot outside.  She is currently using Anoro once daily, and rinses mouth after use. She uses a nebulizer as needed, so she was using it before bedtime and it was helping and she was not waking at night, but then it broke and her night time symptoms recurred.  She has 4 cats and a dog, they all sleep with her. She has never been tested for allergies. She takes singulair, she does not take an antihistamine.  She has been on prednisone for 5 times over the past year for flare-ups.   She has symptomatic geed, controlled nexium bid, and sleeps with head elevated with 2 pillows.   **Chest x-ray 06/22/2017>> images personally reviewed, normal lungs **Absolute eosinophil count 10/08/2017 [facility]>> 340 **PFT 10/29/2017>> FVC was 41% predicted, increased to 58% with bronchodilator, FEV1 was 27% predicted increased to 43% with bronchodilator.  Ratio 62%.  TLC 88%, RV/TLC ratio was elevated.  DLCO is reduced at 61%.  Flow volume loop appears obstructed. - Overall this test shows severe obstructive lung disease, improvement after bronchodilator is likely in part due to effort dependence.  There is also severe air trapping.  These findings are consistent with severe COPD/emphysema.  PMHX:   Past Medical History:  Diagnosis Date  . Abdominal mass,  RUQ (right upper quadrant)   . Anxiety   . Arthritis   . Asthma   . Bronchitis   . Chronic pain   . COPD (chronic obstructive pulmonary disease) ([place])   . Degenerative joint disease (DJD) of hip   . Dysphagia   . Foot fracture, left 05/02/15  . GERD (gastroesophageal reflux disease)   . IBS (irritable bowel syndrome)   . Low back pain   . Occipital neuralgia   . Other intervertebral disc degeneration, lumbar region   . RLS (restless legs syndrome)   . Spondylosis of lumbar spine   . Urinary incontinence    Surgical Hx:    Past Surgical History:  Procedure Laterality Date  . ABDOMINAL HYSTERECTOMY    . APPENDECTOMY    . BLADDER SURGERY     sling - Dr. Davis Murray  . CESAREAN SECTION     Family Hx:  Family History  Problem Relation Age of Onset  . Neuropathy Mother   . Arthritis Father   . Hypertension Father   . Leukemia Father   . Heart disease Father        CAD s/p CABG  . Healthy Brother   . Healthy Daughter   . Healthy Son   . Dementia Maternal Grandmother   . Hypertension Maternal Grandmother   . Hypercholesterolemia Maternal Grandmother   . Heart disease Maternal Grandmother   . Healthy Brother   . Healthy Brother   . Stroke Neg Hx   . Cancer Neg Hx    Social Hx:   Social History   Tobacco Use  . Smoking status: Current Every Day Smoker    Packs/day: 0.50    Years: 32.00    Pack years: 16.00    Types: Cigarettes  . Smokeless tobacco: Never Used  . Tobacco comment: Started smoking at age 41  Substance Use Topics  . Alcohol use: Yes    Alcohol/week: 0.0 oz    Comment: occassionally  . Drug use: No   Medication:    Current Outpatient Medications:  .  albuterol (PROVENTIL HFA;VENTOLIN HFA) 108 (90 Base) MCG/ACT inhaler, Inhale 2 puffs into the lungs every 6 (six) hours as needed for wheezing or shortness of breath., Disp: 1 Inhaler, Rfl: 5 .  diclofenac sodium (VOLTAREN) 1 % GEL, Apply topically., Disp: , Rfl:  .  esomeprazole (NEXIUM) 40 MG capsule, Take 1 capsule (40 mg total) by mouth 2 (two) times daily., Disp: 90 capsule, Rfl: 3 .  fluticasone (FLONASE) 50 MCG/ACT nasal spray, Place 2 sprays into both nostrils daily., Disp: 16 g, Rfl: 6 .  hydrOXYzine (ATARAX/VISTARIL) 25 MG tablet, Take 1 tablet (25 mg total) by mouth 3 (three) times daily as needed., Disp: 30 tablet, Rfl: 5 .  ipratropium-albuterol (DUONEB) 0.5-2.5 (3) MG/3ML SOLN, Take 3 mLs by nebulization every 6 (six) hours as needed (shortness of breath and wheezing)., Disp: 360 mL, Rfl: 0 .  levothyroxine  (SYNTHROID, LEVOTHROID) 50 MCG tablet, Take 1 tablet (50 mcg total) by mouth daily before breakfast., Disp: 30 tablet, Rfl: 2 .  montelukast (SINGULAIR) 10 MG tablet, Take 1 tablet (10 mg total) by mouth at bedtime., Disp: 90 tablet, Rfl: 3 .  oxyCODONE-acetaminophen (PERCOCET/ROXICET) 5-325 MG tablet, Limit one half to 1 tab by mouth per day 2-3 times per day if tolerated a day if tolerated (Patient taking differently: 1 tablet 2 (two) times daily. Limit one half to 1 tab by mouth per day 2-3 times per day if tolerated a day if  tolerated), Disp: 90 tablet, Rfl: 0 .  polyethylene glycol (MIRALAX / GLYCOLAX) packet, Take 17 g by mouth daily. Mix one tablespoon with 8oz of your favorite juice or water once daily if you didn't have a stool the day before., Disp: 30 each, Rfl: 2 .  predniSONE (DELTASONE) 10 MG tablet, Day 1-2 take 6 pills. Day 3 take 5 pills then reduce by 1 pill each day., Disp: 27 tablet, Rfl: 0 .  solifenacin (VESICARE) 5 MG tablet, Take 1 tablet (5 mg total) by mouth daily., Disp: 90 tablet, Rfl: 3 .  tizanidine (ZANAFLEX) 2 MG capsule, Limit 1 tab by mouth per day or 2 - 3 times a day if tolerated, Disp: 90 capsule, Rfl: 0 .  umeclidinium-vilanterol (ANORO ELLIPTA) 62.5-25 MCG/INH AEPB, Inhale 1 puff into the lungs daily., Disp: 28 each, Rfl: 5   Allergies:  Penicillins; Ciprofloxacin; Aspirin; Hydrocodone; Other; and Tape  Review of Systems: Gen:  Denies  fever, sweats, chills HEENT: Denies blurred vision, double vision. bleeds, sore throat Cvc:  No dizziness, chest pain. Resp:   Denies cough or sputum production, shortness of breath Gi: Denies swallowing difficulty, stomach pain. Gu:  Denies bladder incontinence, burning urine Ext:   No Joint pain, stiffness. Skin: No skin rash,  hives  Endoc:  No polyuria, polydipsia. Psych: No depression, insomnia. Other:  All other systems were reviewed with the patient and were negative other that what is mentioned in the HPI.    Physical Examination:   VS: BP 116/64 (BP Location: Left Arm, Cuff Size: Normal)   Pulse 78   Resp 16   Ht 5\' 3"  (1.6 m)   Wt 189 lb (85.7 kg)   SpO2 96%   BMI 33.48 kg/m   General Appearance: No distress  Neuro:without focal findings,  speech normal,  HEENT: PERRLA, EOM intact.   Pulmonary: normal breath sounds, decreased air entry bilaterally. CardiovascularNormal S1,S2.  No m/r/g.   Abdomen: Benign, Soft, non-tender. Renal:  No costovertebral tenderness  GU:  No performed at this time. Endoc: No evident thyromegaly, no signs of acromegaly. Skin:   warm, no rashes, no ecchymosis  Extremities: normal, no cyanosis, clubbing.  Other findings:    LABORATORY PANEL:   CBC No results for input(s): WBC, HGB, HCT, PLT in the last 168 hours. ------------------------------------------------------------------------------------------------------------------  Chemistries  No results for input(s): NA, K, CL, CO2, GLUCOSE, BUN, CREATININE, CALCIUM, MG, AST, ALT, ALKPHOS, BILITOT in the last 168 hours.  Invalid input(s): GFRCGP ------------------------------------------------------------------------------------------------------------------  Cardiac Enzymes No results for input(s): TROPONINI in the last 168 hours. ------------------------------------------------------------  RADIOLOGY:  No results found.     Thank  you for the consultation and for allowing [facility] to assist in the care of your patient. Our recommendations are noted above.  Please contact us if we can be of further service.   Marda Stalker, M.D., F.C.C.P.  Board Certified in Internal Medicine, Pulmonary Medicine, [place], and Sleep Medicine.  [facility] Office Number: [phone]   12/07/2017

## 2017-12-07 ENCOUNTER — Ambulatory Visit (INDEPENDENT_AMBULATORY_CARE_PROVIDER_SITE_OTHER): Payer: Medicare Other | Admitting: Internal Medicine

## 2017-12-07 ENCOUNTER — Encounter: Payer: Self-pay | Admitting: Internal Medicine

## 2017-12-07 VITALS — BP 116/64 | HR 78 | Resp 16 | Ht 63.0 in | Wt 189.0 lb

## 2017-12-07 DIAGNOSIS — J449 Chronic obstructive pulmonary disease, unspecified: Secondary | ICD-10-CM | POA: Diagnosis not present

## 2017-12-07 MED ORDER — FLUTICASONE-UMECLIDIN-VILANT 100-62.5-25 MCG/INH IN AEPB: 1.0000 | INHALATION_SPRAY | Freq: Every day | RESPIRATORY_TRACT | 5 refills | Status: DC

## 2017-12-07 MED ORDER — AMBULATORY NON FORMULARY MEDICATION: 0 refills | Status: AC

## 2017-12-07 NOTE — Patient Instructions (Addendum)
Remove pets from bedroom.   Stop Anoro, start Trelegy Inhaler.   --Quitting smoking is the most important thing that you can do for your health.  --Quitting smoking will have greater affect on your health than any medicine that we can give you.

## 2017-12-08 ENCOUNTER — Other Ambulatory Visit: Payer: Self-pay | Admitting: Internal Medicine

## 2017-12-08 MED ORDER — GLYCOPYRROLATE-FORMOTEROL 9-4.8 MCG/ACT IN AERO
2.0000 | INHALATION_SPRAY | Freq: Two times a day (BID) | RESPIRATORY_TRACT | 3 refills | Status: DC
Start: 2017-12-08 — End: 2018-09-10

## 2017-12-08 NOTE — Telephone Encounter (Signed)
Pt notified of inhaler change from trelegy to [facility]. Nothing further needed.

## 2017-12-24 DIAGNOSIS — Q403 Congenital malformation of stomach, unspecified: Secondary | ICD-10-CM

## 2017-12-24 HISTORY — DX: Congenital malformation of stomach, unspecified: Q40.3

## 2017-12-28 ENCOUNTER — Telehealth: Payer: Self-pay | Admitting: Nurse Practitioner

## 2017-12-28 NOTE — Telephone Encounter (Signed)
Pt called to inform her that her PFT results are left up front for pick up.

## 2017-12-28 NOTE — Telephone Encounter (Signed)
Pt has a procedure at [facility] tomorrow and needs to pick up a copy of breathing test results around 4:30 today.  Her call back number is [phone]

## 2018-02-19 DIAGNOSIS — D696 Thrombocytopenia, unspecified: Secondary | ICD-10-CM | POA: Insufficient documentation

## 2018-02-19 DIAGNOSIS — R110 Nausea: Secondary | ICD-10-CM | POA: Insufficient documentation

## 2018-02-25 ENCOUNTER — Ambulatory Visit
Admission: RE | Admit: 2018-02-25 | Discharge: 2018-02-25 | Disposition: A | Discharge status: Home / Self Care | Payer: Medicare Other | Source: Ambulatory Visit | Attending: Nurse Practitioner | Admitting: Nurse Practitioner

## 2018-02-25 ENCOUNTER — Other Ambulatory Visit
Admission: RE | Admit: 2018-02-25 | Discharge: 2018-02-25 | Disposition: A | Discharge status: Home / Self Care | Payer: Medicare Other | Source: Ambulatory Visit | Attending: Nurse Practitioner | Admitting: Nurse Practitioner

## 2018-02-25 DIAGNOSIS — K219 Gastro-esophageal reflux disease without esophagitis: Secondary | ICD-10-CM

## 2018-02-25 DIAGNOSIS — N281 Cyst of kidney, acquired: Secondary | ICD-10-CM | POA: Insufficient documentation

## 2018-02-25 DIAGNOSIS — R1011 Right upper quadrant pain: Secondary | ICD-10-CM | POA: Diagnosis present

## 2018-02-25 DIAGNOSIS — R932 Abnormal findings on diagnostic imaging of liver and biliary tract: Secondary | ICD-10-CM | POA: Diagnosis not present

## 2018-02-25 DIAGNOSIS — R197 Diarrhea, unspecified: Secondary | ICD-10-CM | POA: Diagnosis not present

## 2018-02-25 LAB — GASTROINTESTINAL PANEL BY PCR, STOOL (REPLACES STOOL CULTURE)

## 2018-02-25 LAB — C DIFFICILE QUICK SCREEN W PCR REFLEX
C Diff antigen: NEGATIVE
C Diff interpretation: NOT DETECTED
C Diff toxin: NEGATIVE

## 2018-03-05 ENCOUNTER — Other Ambulatory Visit: Payer: Self-pay | Admitting: Nurse Practitioner

## 2018-03-05 DIAGNOSIS — E042 Nontoxic multinodular goiter: Secondary | ICD-10-CM

## 2018-03-05 DIAGNOSIS — E039 Hypothyroidism, unspecified: Secondary | ICD-10-CM

## 2018-03-22 DIAGNOSIS — N281 Cyst of kidney, acquired: Secondary | ICD-10-CM

## 2018-03-22 HISTORY — DX: Cyst of kidney, acquired: N28.1

## 2018-03-25 DIAGNOSIS — K760 Fatty (change of) liver, not elsewhere classified: Secondary | ICD-10-CM | POA: Insufficient documentation

## 2018-03-25 DIAGNOSIS — A09 Infectious gastroenteritis and colitis, unspecified: Secondary | ICD-10-CM | POA: Insufficient documentation

## 2018-03-25 DIAGNOSIS — Z87898 Personal history of other specified conditions: Secondary | ICD-10-CM | POA: Insufficient documentation

## 2018-03-25 HISTORY — DX: Infectious gastroenteritis and colitis, unspecified: A09

## 2018-03-25 HISTORY — DX: Fatty (change of) liver, not elsewhere classified: K76.0

## 2018-04-01 ENCOUNTER — Other Ambulatory Visit: Payer: Self-pay | Admitting: Nurse Practitioner

## 2018-04-01 DIAGNOSIS — J418 Mixed simple and mucopurulent chronic bronchitis: Secondary | ICD-10-CM

## 2018-04-01 DIAGNOSIS — E039 Hypothyroidism, unspecified: Secondary | ICD-10-CM

## 2018-04-01 DIAGNOSIS — E042 Nontoxic multinodular goiter: Secondary | ICD-10-CM

## 2018-04-01 DIAGNOSIS — G2581 Restless legs syndrome: Secondary | ICD-10-CM

## 2018-04-08 ENCOUNTER — Ambulatory Visit (INDEPENDENT_AMBULATORY_CARE_PROVIDER_SITE_OTHER): Payer: Medicare Other | Admitting: Urology

## 2018-04-08 ENCOUNTER — Encounter: Payer: Self-pay | Admitting: Urology

## 2018-04-08 VITALS — BP 100/66 | HR 76 | Ht 61.0 in | Wt 188.0 lb

## 2018-04-08 DIAGNOSIS — R300 Dysuria: Secondary | ICD-10-CM | POA: Diagnosis not present

## 2018-04-08 DIAGNOSIS — R3129 Other microscopic hematuria: Secondary | ICD-10-CM | POA: Diagnosis not present

## 2018-04-08 LAB — URINALYSIS, COMPLETE
BILIRUBIN UA: NEGATIVE
Glucose, UA: NEGATIVE
Ketones, UA: NEGATIVE
NITRITE UA: NEGATIVE
Specific Gravity, UA: 1.025 (ref 1.005–1.030)
UUROB: 0.2 mg/dL (ref 0.2–1.0)
pH, UA: 6 (ref 5.0–7.5)

## 2018-04-08 LAB — MICROSCOPIC EXAMINATION

## 2018-04-08 NOTE — Progress Notes (Signed)
04/08/2018 12:35 PM   Janet Murray [birth year] [id number]  Referring provider: Mikey College, NP Janet Murray, Janet Murray [id number]  CC: Right renal cyst, microscopic hematuria  HPI: I saw Janet Murray today in urology clinic in consultation for right renal cyst and microscopic hematuria from Janet Paradise, NP.  She is a 47 year old female with a history of reported bladder sling approximately 12 years ago by Janet Murray in the gynecology department, as well as extensive smoking history, who reports a few years of ongoing urinary urgency and frequency.  She also reports urge and stress incontinence.  She does not have any dysuria.  She has noticed some pink urine over the last few months intermittently.  She reports she has never had a hematuria work-up with cystoscopy previously.  She reports approximately one UTI per month over the last few months, but denies that she feels like she has a UTI at this time.  Regarding her right renal cyst, this was seen on a recent abdominal ultrasound, and is unchanged from prior CT abdomen pelvis with contrast dated 03/2017.  This is a simple cyst and does not require further follow-up.  Her past medical history is notable for COPD and fibromyalgia.  There are no aggravating or alleviating factors  PMH: Past Medical History:  Diagnosis Date  . Abdominal mass, RUQ (right upper quadrant)   . Anxiety   . Arthritis   . Asthma   . Bronchitis   . Carpal tunnel syndrome of left wrist 10/25/2014  . Chronic bronchitis, obstructive ([place]) 07/20/2017  . Chronic pain   . COPD (chronic obstructive pulmonary disease) ([place])   . Cyst of right kidney 03/22/2018  . DDD (degenerative disc disease), lumbar 10/25/2014  . Degenerative joint disease (DJD) of hip   . Diarrhea of infectious origin 03/25/2018  . Dysphagia   . Facet syndrome, lumbar 10/25/2014  . Fatty liver 03/25/2018  . Foot fracture, left 05/02/15  . 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 GERD  (gastroesophageal reflux disease)   . Hereditary and idiopathic peripheral neuropathy 11/06/2014  . IBS (irritable bowel syndrome)   . Low back pain   . Migraine 02/04/2016  . Occipital neuralgia   . 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 Other intervertebral disc degeneration, lumbar region   . Other irritable bowel syndrome 04/20/2017  . Other spondylosis with radiculopathy, lumbar region 10/25/2014  . RLS (restless legs syndrome)   . Sacroiliac joint dysfunction of both sides 10/25/2014  . Spondylosis of lumbar spine   . Urinary incontinence     Surgical History: Past Surgical History:  Procedure Laterality Date  . ABDOMINAL HYSTERECTOMY    . APPENDECTOMY    . BLADDER SURGERY     sling - Janet Murray  . CESAREAN SECTION      Allergies:  Allergies  Allergen Reactions  . Penicillins Hives and Swelling    Has patient had a PCN reaction causing immediate rash, facial/tongue/throat swelling, SOB or lightheadedness with hypotension: Yes Has patient had a PCN reaction causing severe rash involving mucus membranes or skin necrosis: No Has patient had a PCN reaction that required hospitalization No Has patient had a PCN reaction occurring within the last 10 years: No If all of the above answers are "NO", then may proceed with Cephalosporin use.  . Ciprofloxacin Itching  . Aspirin Palpitations    Heart palpitations  . Hydrocodone Hives and Rash  . Other Rash    bandaids  . Tape  Rash    bandaids    Family History: Family History  Problem Relation Age of Onset  . Neuropathy Mother   . Arthritis Father   . Hypertension Father   . Leukemia Father   . Heart disease Father        CAD s/p CABG  . Healthy Brother   . Healthy Daughter   . Healthy Son   . Dementia Maternal Grandmother   . Hypertension Maternal Grandmother   . Hypercholesterolemia Maternal Grandmother   . Heart disease Maternal Grandmother   . Healthy Brother   . Healthy  Brother   . Stroke Neg Hx   . Cancer Neg Hx     Social History:  reports that she has been smoking cigarettes. She has a 16.00 pack-year smoking history. She has never used smokeless tobacco. She reports that she drinks alcohol. She reports that she does not use drugs.  ROS: Please see flowsheet from today's date for complete review of systems.  Physical Exam: BP 100/66   Pulse 76   Ht 5\' 1"  (1.549 m)   Wt 188 lb (85.3 kg)   BMI 35.52 kg/m    Constitutional:  Alert and oriented, No acute distress. Cardiovascular: No clubbing, cyanosis, or edema. Respiratory: Normal respiratory effort, no increased work of breathing. GI: Abdomen is soft, nontender, nondistended, no abdominal masses GU: No CVA tenderness Lymph: No cervical or inguinal lymphadenopathy. Skin: No rashes, bruises or suspicious lesions. Neurologic: Grossly intact, no focal deficits, moving all 4 extremities. Psychiatric: Normal mood and affect.  Laboratory Data: Creatinine 0.69 Today's urinalysis with 6-10 WBCs, 3-10 RBCs, 0-10 epithelial cells, many bacteria, nitrite negative.  Pertinent Imaging: I have personally reviewed the renal ultrasound and CT abdomen pelvis with contrast from October 2018.  There is a simple right renal cyst 1.7 cm in size, minimally changed from original CT, simple appearing, no enhancement.  Assessment & Plan:   In summary, [name] is a 47 year old female with a history of bladder sling over 10 years ago who presents with ongoing urinary urgency, frequency, urgent continence, and stress incontinence as well as microscopic and gross hematuria.  Her renal cyst is benign-appearing on ultrasound and CT with contrast and does not require further work-up.  We discussed common possible etiologies of hematuria including malignancy, prior bladder sling erosion, urolithiasis, medical renal disease, and idiopathic. Standard workup recommended by the AUA includes imaging with CT urogram to assess the  upper tracts, and cystoscopy. Cytology is performed on patient's with gross hematuria to look for malignant cells in the urine.  -RTC for CT urogram and cystoscopy -Consider referral to Janet Murray regarding her stress and urge incontinence with history of prior bladder sling pending results of above  Billey Co, MD  [facility] [address], [place], [place] [address] [phone]

## 2018-04-09 ENCOUNTER — Other Ambulatory Visit: Payer: Self-pay

## 2018-04-09 ENCOUNTER — Ambulatory Visit (INDEPENDENT_AMBULATORY_CARE_PROVIDER_SITE_OTHER): Payer: Medicare Other | Admitting: Nurse Practitioner

## 2018-04-09 ENCOUNTER — Encounter: Payer: Self-pay | Admitting: Nurse Practitioner

## 2018-04-09 VITALS — BP 109/64 | HR 83 | Temp 98.4°F | Resp 18 | Ht 61.0 in | Wt 185.8 lb

## 2018-04-09 DIAGNOSIS — J440 Chronic obstructive pulmonary disease with acute lower respiratory infection: Secondary | ICD-10-CM | POA: Diagnosis not present

## 2018-04-09 DIAGNOSIS — J0100 Acute maxillary sinusitis, unspecified: Secondary | ICD-10-CM | POA: Diagnosis not present

## 2018-04-09 DIAGNOSIS — J209 Acute bronchitis, unspecified: Secondary | ICD-10-CM

## 2018-04-09 MED ORDER — DOXYCYCLINE HYCLATE 100 MG PO TABS: 100.0000 mg | ORAL_TABLET | Freq: Two times a day (BID) | ORAL | 0 refills | Status: DC

## 2018-04-09 MED ORDER — PREDNISONE 50 MG PO TABS: 50.0000 mg | ORAL_TABLET | Freq: Every day | ORAL | 0 refills | Status: AC

## 2018-04-09 NOTE — Patient Instructions (Addendum)
[name],   Thank you for coming in to clinic today.  1. It sounds like you have a Bacterial sinus infection.  Recommend good hand washing. - START taking doxycycline 100 mg one tablet twice daily.  Make sure to take all doses of your antibiotic.  START this Sunday if you are not feeling better.  Use this only if this is not improving to make sure antibiotics work in future for you. - While you are on an antibiotic, take a probiotic.  Antibiotics kill good and bad bacteria.  A probiotic helps to replace your good bacteria. Probiotic pills can be found over the counter.  One brand is Florastor, but you can use any brand you prefer.  You can also get good bacteria from foods.  These foods are yogurt, kefir, kombucha, and fresh, refrigerated and uncooked sauerkraut. - Drink plenty of fluids.  - Continue anti-histamine Singulair 10mg  daily. - Continue Flonase 2 sprays each nostril daily for up to 4-6 weeks  Other over the counter medications you may try, if needed for symptoms are: - If congestion is worse, start OTC Mucinex (or may try Mucinex-DM for cough) up to 7-10 days then stop - You may try over the counter Nasal Saline spray (Simply Saline, Ocean Spray) as needed to reduce congestion. - Start taking Tylenol extra strength 1 to 2 tablets every 6-8 hours for aches or fever/chills for next few days as needed.  Do not take more than 3,000 mg in 24 hours from all medicines.  You may also take ibuprofen 200-400mg  every 8 hours as needed.   - Drink warm herbal tea with honey for sore throat.   If symptoms are significantly worse with persistent fevers/chills despite tylenol/ibpurofen, nausea, vomiting unable to tolerate food/fluids or medicine, body aches, or shortness of breath, sinus pain pressure or worsening productive cough, then follow-up for re-evaluation, may seek more immediate care at Urgent Care or the ED if you are more concerned that it is an emergency.  Please schedule a follow-up  appointment with [name], AGNP. Return 5-7 days if symptoms worsen or fail to improve.  If you have any other questions or concerns, please feel free to call the clinic or send a message through [facility]. You may also schedule an earlier appointment if necessary.  You will receive a survey after today's visit either digitally by e-mail or paper by [employer]. Your experiences and feedback matter to [place].  Please respond so we know how we are doing as we provide care for you.   [name], DNP, AGNP-BC Adult Gerontology Nurse Practitioner [facility]

## 2018-04-09 NOTE — Progress Notes (Signed)
Subjective:    Patient ID: Janet Murray, female    DOB: 06-Jun-1971, 47 y.o.   MRN: [id number]  Janet Murray is a 47 y.o. female presenting on 04/09/2018 for Cough (productive coughing, sinus pressure, nasal drainage with soreness in the chest x 1 week )   HPI Cough Patient presents with URI symptoms x last 7 days is having productive cough, sinus pressure, runny nose, post nasal drip, chest tightness for about 1 week. - Patient is concerned related to underlying pulmonary disease and worsening chest tightness for preventing exacerbation. - No known sick contacts - Denies fever, chills, or sweats, nausea, vomiting, or diarrhea.  Social History   Tobacco Use  . Smoking status: Current Every Day Smoker    Packs/day: 0.50    Years: 32.00    Pack years: 16.00    Types: Cigarettes  . Smokeless tobacco: Never Used  . Tobacco comment: Started smoking at age 31  Substance Use Topics  . Alcohol use: Yes    Alcohol/week: 0.0 standard drinks    Comment: occassionally  . Drug use: No    Review of Systems Per HPI unless specifically indicated above     Objective:    BP 109/64   Pulse 83   Temp 98.4 F (36.9 C)   Resp 18   Ht 5\' 1"  (1.549 m)   Wt 185 lb 12.8 oz (84.3 kg)   SpO2 97%   BMI 35.11 kg/m   Wt Readings from Last 3 Encounters:  04/09/18 185 lb 12.8 oz (84.3 kg)  04/08/18 188 lb (85.3 kg)  12/07/17 189 lb (85.7 kg)    Physical Exam  Constitutional: She appears well-developed and well-nourished.  HENT:  Head: Normocephalic and atraumatic.  Right Ear: Hearing, tympanic membrane, external ear and ear canal normal.  Left Ear: Hearing, tympanic membrane, external ear and ear canal normal.  Nose: Mucosal edema and rhinorrhea present. Right sinus exhibits maxillary sinus tenderness and frontal sinus tenderness. Left sinus exhibits maxillary sinus tenderness and frontal sinus tenderness.  Mouth/Throat: Uvula is midline and mucous membranes are normal. Posterior  oropharyngeal edema (cobblestoning) present. Oropharyngeal exudate: clear secretions.  Eyes: Pupils are equal, round, and reactive to light. Conjunctivae and EOM are normal. Right eye exhibits no discharge. Left eye exhibits no discharge.  Neck: Normal range of motion. Neck supple.  Cardiovascular: Normal rate, regular rhythm, S1 normal, S2 normal, normal heart sounds and intact distal pulses.  Pulmonary/Chest: Effort normal. No respiratory distress. She has decreased breath sounds (throughout all lobes). She has no wheezes. She has rhonchi in the right lower field and the left lower field. She has no rales.  Lymphadenopathy:    She has cervical adenopathy.  Neurological: She is alert.  Skin: Skin is warm and dry. Capillary refill takes less than 2 seconds.  Psychiatric: She has a normal mood and affect. Her behavior is normal. Judgment and thought content normal.  Vitals reviewed.   Results for orders placed or performed in visit on 04/08/18  Microscopic Examination  Result Value Ref Range   WBC, UA 6-10 (A) 0 - 5 /hpf   RBC, UA 3-10 (A) 0 - 2 /hpf   Epithelial Cells (non renal) 0-10 0 - 10 /hpf   Mucus, UA Present (A) Not Estab.   Bacteria, UA Many (A) None seen/Few  Urinalysis, Complete  Result Value Ref Range   Specific Gravity, UA 1.025 1.005 - 1.030   pH, UA 6.0 5.0 - 7.5   Color, UA  Yellow Yellow   Appearance Ur Cloudy (A) Clear   Leukocytes, UA 3+ (A) Negative   Protein, UA Trace (A) Negative/Trace   Glucose, UA Negative Negative   Ketones, UA Negative Negative   RBC, UA 2+ (A) Negative   Bilirubin, UA Negative Negative   Urobilinogen, Ur 0.2 0.2 - 1.0 mg/dL   Nitrite, UA Negative Negative   Microscopic Examination See below:       Assessment & Plan:   Problem List Items Addressed This Visit    None    Visit Diagnoses    Acute non-recurrent maxillary sinusitis    -  Primary   Relevant Medications   predniSONE (DELTASONE) 50 MG tablet   doxycycline (VIBRA-TABS)  100 MG tablet   COPD with acute bronchitis (HCC)       Relevant Medications   predniSONE (DELTASONE) 50 MG tablet    Consistent with URI and secondary sinusitis with symptoms worsening over the past 7 days and initial symptoms of nasal congestion and sinus pressure over 2 weeks ago.  Very mild acute COPD exacerbation with increased wheezing, chest tightness.  Plan: 1.START taking doxycycline 100 mg tablets every 12 hours for 10 days.  Discussed completing antibiotic. - While on antibiotic, take a probiotic OTC or from food. - START prednisone 50 mg once daily x 5 days for COPD exacerbation. - Continue inhalers without change. - Continue anti-histamine loratadine or cetirizine 10mg  daily. - Can use Flonase 2 sprays each nostril daily for up to 4-6 weeks if no epistaxis. - Start Mucinex-DM OTC for  7-10 days prn congestion 2. Supportive care with nasal saline, warm herbal tea with honey, 3. Improve hydration 4. Tylenol / Motrin PRN fevers  5. Return criteria given    Meds ordered this encounter  Medications  . predniSONE (DELTASONE) 50 MG tablet    Sig: Take 1 tablet (50 mg total) by mouth daily with breakfast for 5 days.    Dispense:  5 tablet    Refill:  0    Order Specific Question:   Supervising Provider    Answer:   Olin Hauser [[id number]]  . doxycycline (VIBRA-TABS) 100 MG tablet    Sig: Take 1 tablet (100 mg total) by mouth 2 (two) times daily. START Sunday if not feeling better.    Dispense:  20 tablet    Refill:  0    Order Specific Question:   Supervising Provider    Answer:   Olin Hauser [[id number]]    Follow up plan: Return 5-7 days if symptoms worsen or fail to improve.  [name], DNP, AGPCNP-BC Adult Gerontology Primary Care Nurse Practitioner [facility] 04/09/2018, 1:54 PM

## 2018-04-12 ENCOUNTER — Encounter: Payer: Self-pay | Admitting: Nurse Practitioner

## 2018-04-19 ENCOUNTER — Ambulatory Visit
Admission: RE | Admit: 2018-04-19 | Discharge: 2018-04-19 | Disposition: A | Discharge status: Home / Self Care | Payer: Medicare Other | Source: Ambulatory Visit | Attending: Urology | Admitting: Urology

## 2018-04-19 DIAGNOSIS — R3129 Other microscopic hematuria: Secondary | ICD-10-CM | POA: Diagnosis present

## 2018-04-19 DIAGNOSIS — I700 Atherosclerosis of aorta: Secondary | ICD-10-CM | POA: Diagnosis not present

## 2018-04-19 DIAGNOSIS — N281 Cyst of kidney, acquired: Secondary | ICD-10-CM | POA: Insufficient documentation

## 2018-04-19 MED ORDER — IOPAMIDOL (ISOVUE-300) INJECTION 61%
125.0000 mL | Freq: Once | INTRAVENOUS | Status: AC | PRN
  Administered 2018-04-19: 125 mL via INTRAVENOUS

## 2018-04-22 ENCOUNTER — Other Ambulatory Visit: Payer: Self-pay

## 2018-04-22 ENCOUNTER — Ambulatory Visit (INDEPENDENT_AMBULATORY_CARE_PROVIDER_SITE_OTHER): Payer: Medicare Other | Admitting: Urology

## 2018-04-22 ENCOUNTER — Encounter: Payer: Self-pay | Admitting: Urology

## 2018-04-22 VITALS — BP 108/73 | HR 79 | Ht 61.0 in | Wt 188.6 lb

## 2018-04-22 DIAGNOSIS — R3129 Other microscopic hematuria: Secondary | ICD-10-CM

## 2018-04-22 DIAGNOSIS — R300 Dysuria: Secondary | ICD-10-CM

## 2018-04-22 LAB — URINALYSIS, COMPLETE
Bilirubin, UA: NEGATIVE
GLUCOSE, UA: NEGATIVE
Ketones, UA: NEGATIVE
Nitrite, UA: NEGATIVE
PH UA: 5.5 (ref 5.0–7.5)
PROTEIN UA: NEGATIVE
Specific Gravity, UA: 1.025 (ref 1.005–1.030)
Urobilinogen, Ur: 0.2 mg/dL (ref 0.2–1.0)

## 2018-04-22 LAB — MICROSCOPIC EXAMINATION

## 2018-04-22 NOTE — Progress Notes (Signed)
Cystoscopy Procedure Note:  Indication: Microscopic hematuria, history of bladder sling  After informed consent and discussion of the procedure and its risks, Janet Murray was positioned and prepped in the standard fashion. Cystoscopy was performed with a flexible cystoscope. The urethra, bladder neck and entire bladder was visualized in a standard fashion. Retroflexion showed no concerning findings. Careful examination of the urethra showed no sling erosion.   No sling erosion on pelvic exam.  Imaging: CT urogram dated 04/19/2018 personally reviewed.  No urolithiasis, hydronephrosis, renal masses, or filling defects  Findings: Normal cystoscopy  Assessment and Plan: Follow-up with Dr. Arther Dames for urge and stress incontinence in setting of prior bladder sling  Nickolas Madrid, MD 04/22/2018

## 2018-04-27 ENCOUNTER — Other Ambulatory Visit: Payer: Medicare Other | Admitting: Urology

## 2018-06-07 ENCOUNTER — Encounter: Payer: Self-pay | Admitting: Urology

## 2018-06-07 ENCOUNTER — Ambulatory Visit: Payer: Medicare Other | Admitting: Urology

## 2018-06-08 ENCOUNTER — Telehealth: Payer: Self-pay | Admitting: Nurse Practitioner

## 2018-06-08 NOTE — Telephone Encounter (Signed)
Left message for patient to call back  

## 2018-06-08 NOTE — Telephone Encounter (Signed)
Pt called requesting that you call her before she started her medication.Pt call back # is  [phone]

## 2018-06-08 NOTE — Telephone Encounter (Signed)
I am not sure which medication she would be starting at this time.  Please clarify what the patient's specific question is about. Thanks

## 2018-06-10 NOTE — Telephone Encounter (Signed)
Patient should not take these antibiotics now.  These would have been for an old infection.

## 2018-06-10 NOTE — Telephone Encounter (Signed)
Advised patient as per Ander Purpura.

## 2018-06-10 NOTE — Telephone Encounter (Signed)
Spoke to the patient her question was for antibiotics that was Rx on 11/01. She was recommended to let provider know before she start antibiotics and she wants to take antibiotics now. Advised that it's been 2 month after that recommendation and will pass the message to her provider.

## 2018-06-14 ENCOUNTER — Encounter: Payer: Self-pay | Admitting: Family Medicine

## 2018-06-14 ENCOUNTER — Ambulatory Visit (INDEPENDENT_AMBULATORY_CARE_PROVIDER_SITE_OTHER): Payer: Medicare Other | Admitting: Family Medicine

## 2018-06-14 VITALS — BP 109/68 | HR 85 | Temp 99.2°F | Resp 16 | Ht 61.0 in | Wt 183.0 lb

## 2018-06-14 DIAGNOSIS — J449 Chronic obstructive pulmonary disease, unspecified: Secondary | ICD-10-CM | POA: Diagnosis not present

## 2018-06-14 DIAGNOSIS — J441 Chronic obstructive pulmonary disease with (acute) exacerbation: Secondary | ICD-10-CM | POA: Diagnosis not present

## 2018-06-14 MED ORDER — PREDNISONE 50 MG PO TABS: 50.0000 mg | ORAL_TABLET | Freq: Every day | ORAL | 0 refills | Status: DC

## 2018-06-14 MED ORDER — IPRATROPIUM BROMIDE 0.06 % NA SOLN: 2.0000 | Freq: Four times a day (QID) | NASAL | 0 refills | Status: DC

## 2018-06-14 NOTE — Progress Notes (Signed)
Subjective:    Patient ID: Janet Murray, female    DOB: 09/23/1970, 48 y.o.   MRN: [id number]  Janet Murray is a 48 y.o. female presenting on 06/14/2018 for Nasal Congestion (cough, [place] mucus denies chills or fever but has Hx of COPD, SOB onset 6 days)  Patient presents for a same day appointment.  PCP is [name], AGPCNP-BC  HPI   Acute COPD / Chronic Bronchitis - Last visit with PCP in 04/2018 for same problem w/ COPD, treated with Doxycyline and Prednisone burst, see prior notes for background information. - Interval update with at that time she only took x 2 pills of the Doxyxycline as it was a precautionary antibiotic only, she improved and did well - Today patient reports now recurrent flare up recently past 6-7 days she has developed worsening productive cough, dyspnea, wheezing, similar to prior COPD. She is using inhalers/nebulizers with improvement. - She has 18 out of 20 pills of the doxycycline left from 04/2018 asking if she should take this, she called recently on this same topic - Active smoker - Denies any fever, chills, chest pain, nausea vomiting  Health Maintenance: Did not receive Flu vaccine this season, and she declines  Depression screen [facility] 2/9 06/14/2018 02/06/2016 12/19/2015  Decreased Interest 0 0 0  Down, Depressed, Hopeless 0 0 0  PHQ - 2 Score 0 0 0    Social History   Tobacco Use  . Smoking status: Current Every Day Smoker    Packs/day: 0.50    Years: 32.00    Pack years: 16.00    Types: Cigarettes  . Smokeless tobacco: Never Used  . Tobacco comment: Started smoking at age 89  Substance Use Topics  . Alcohol use: Yes    Alcohol/week: 0.0 standard drinks    Comment: occassionally  . Drug use: No    Review of Systems Per HPI unless specifically indicated above     Objective:    BP 109/68   Pulse 85   Temp 99.2 F (37.3 C) (Oral)   Resp 16   Ht 5\' 1"  (1.549 m)   Wt 183 lb (83 kg)   SpO2 97%   BMI 34.58 kg/m   Wt Readings  from Last 3 Encounters:  06/14/18 183 lb (83 kg)  04/22/18 188 lb 9.6 oz (85.5 kg)  04/09/18 185 lb 12.8 oz (84.3 kg)    Physical Exam Vitals signs and nursing note reviewed.  Constitutional:      General: She is not in acute distress.    Appearance: She is well-developed. She is not diaphoretic.     Comments: Well-appearing, comfortable, cooperative  HENT:     Head: Normocephalic and atraumatic.     Comments: Frontal / maxillary sinuses non-tender. Nares patent without purulence or edema. Bilateral TMs clear without erythema, effusion or bulging. Oropharynx clear without erythema, exudates, edema or asymmetry. Eyes:     General:        Right eye: No discharge.        Left eye: No discharge.     Conjunctiva/sclera: Conjunctivae normal.  Neck:     Musculoskeletal: Normal range of motion and neck supple.     Thyroid: No thyromegaly.  Cardiovascular:     Rate and Rhythm: Normal rate and regular rhythm.     Heart sounds: Normal heart sounds. No murmur.  Pulmonary:     Effort: Pulmonary effort is normal. No respiratory distress.     Breath sounds: Wheezing present. No  rales.     Comments: Reduced air movement bilateral. Some scattered wheezes. No focal crackles. Occasional coughing spells. Musculoskeletal: Normal range of motion.  Lymphadenopathy:     Cervical: No cervical adenopathy.  Skin:    General: Skin is warm and dry.     Findings: No erythema or rash.  Neurological:     Mental Status: She is alert and oriented to person, place, and time.  Psychiatric:        Behavior: Behavior normal.     Comments: Well groomed, good eye contact, normal speech and thoughts        Assessment & Plan:   Problem List Items Addressed This Visit    Chronic bronchitis, obstructive (HCC)   Relevant Medications   predniSONE (DELTASONE) 50 MG tablet   ipratropium (ATROVENT) 0.06 % nasal spray    Other Visit Diagnoses    COPD with acute exacerbation ([place])    -  Primary   Relevant  Medications   predniSONE (DELTASONE) 50 MG tablet   ipratropium (ATROVENT) 0.06 % nasal spray   doxycycline (VIBRA-TABS) 100 MG tablet      Consistent with moderate acute exacerbation of COPD w/ acute on chronic bronchitis Similar to prior exacerbation, last 04/2018 did not finish doxycycline - No hypoxia (97% on RA), afebrile, no recent hospitalization  Plan: 1. Start Prednisone 50mg  x 5 day steroid burst 2. RESTART Doxycycline from previous visit 04/2018 - has 18 out of 20 pills for 100mg  BID dosing for 9 days 3. Continue nebulizers/breathing treatments, continue maintenance therapy - Start Atrovent nasal spray decongestant 2 sprays in each nostril up to 4 times daily for 7 days - Defer x-ray today no focal lung abnormality  Follow-up within about 1 week if not improving, otherwise strict return criteria to go to ED   Meds ordered this encounter  Medications  . predniSONE (DELTASONE) 50 MG tablet    Sig: Take 1 tablet (50 mg total) by mouth daily with breakfast.    Dispense:  5 tablet    Refill:  0  . ipratropium (ATROVENT) 0.06 % nasal spray    Sig: Place 2 sprays into both nostrils 4 (four) times daily. For up to 5-7 days then stop.    Dispense:  15 mL    Refill:  0      Follow up plan: Return in about 1 week (around 06/21/2018), or if symptoms worsen or fail to improve, for COPD.  Nobie Putnam, DO [facility] 06/14/2018, 2:13 PM

## 2018-06-14 NOTE — Patient Instructions (Addendum)
Thank you for coming to the office today.  1. It sounds like you had an Upper Respiratory Virus that has settled into a Bronchitis, lower respiratory tract infection. I don't have concerns for pneumonia today, and think that this should gradually improve. Once you are feeling better, the cough may take a few weeks to fully resolve. I do hear wheezing and coarse breath sounds, this may be due to the virus, also could be related to smoking.  Start taking Doxycycline antibiotic 100mg  twice daily for 10 days. Take with full glass of water and stay upright for at least 30 min after taking, may be seated or standing, but should NOT lay down. This is just a safety precaution, if this medicine does not go all the way down throat well it could cause some burning discomfort to throat and esophagus.  - Start Prednisone 50mg  daily for next 5 days - this will open up lungs allow you to breath better and treat that wheezing or bronchospasm - Use Albuterol  / nebulizer as needed  Start Atrovent nasal spray decongestant 2 sprays in each nostril up to 4 times daily for 7 days  - Use nasal saline (Simply Saline or Ocean Spray) to flush nasal congestion multiple times a day, may help cough - Drink plenty of fluids to improve congestion  If your symptoms seem to worsen instead of improve over next several days, including significant fever / chills, worsening shortness of breath, worsening wheezing, or nausea / vomiting and can't take medicines - return sooner or go to hospital Emergency Department for more immediate treatment.   Please schedule a Follow-up Appointment to: Return in about 1 week (around 06/21/2018), or if symptoms worsen or fail to improve, for COPD.  If you have any other questions or concerns, please feel free to call the office or send a message through [place]. You may also schedule an earlier appointment if necessary.  Additionally, you may be receiving a survey about your experience at our  office within a few days to 1 week by e-mail or mail. We value your feedback.  Nobie Putnam, DO [facility]

## 2018-06-29 ENCOUNTER — Other Ambulatory Visit: Payer: Self-pay | Admitting: Nurse Practitioner

## 2018-06-29 DIAGNOSIS — G2581 Restless legs syndrome: Secondary | ICD-10-CM

## 2018-06-29 DIAGNOSIS — J4540 Moderate persistent asthma, uncomplicated: Secondary | ICD-10-CM

## 2018-07-19 NOTE — Progress Notes (Signed)
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 - Status post PFT which showed FEV1 of 43%, consistent with severe emphysema. - Reiterated again today that the best thing she can do to prevent further decline is smoking cessation. - Mild peripheral eosinophilia suggests that she may benefit from an inhaled steroid which was ordered previously but she is now on bevespi which seems to help.  Her breathing still is short with activity but she is otherwise on maximal therapy. -Continue nebulizer as needed. - Declines flu vaccine.  Nicotine abuse. - Spent 3 minutes in discussion, patient has not yet ready to quit.  She is now down to half a pack per day.  GERD. - Currently controlled with Nexium twice daily.  Continue Nexium and behavioral modifications, as well as sleeping inclined.  Return in about 1 year (around 07/21/2019).   Date: 07/19/2018  MRN# [id number] PHYLICIA MCGAUGH 01-17-1971    [name] is a 48 y.o. old female seen in consultation for chief complaint of:    Chief Complaint  Patient presents with  . Follow-up    pt reports of sob with exertion, prod cough with white mucus & wheezing     HPI:  The patient is a 48 year old female smoker with severe COPD, FEV1 of 43%.  Last visit she was switched from Anoro to Trelegy inhaler.  She was advised hospitalization, and removing the 5 pets from her bedroom.She has a history of multiple exacerbations.   Since her last visit she is now on bevespi 2 puffs twice daily, and overall her breathing is better than it was. She is smoking down to half ppd. She has moved the 5 dogs out of her bedroom. She is using nebs once per week.   She has been on prednisone for 5 times over the past year for flare-ups.   She has symptomatic GERD, controlled nexium bid, and sleeps with head elevated with 2 pillows.   **Chest x-ray 06/22/2017>> images personally reviewed, normal  lungs **Absolute eosinophil count 10/08/2017 [facility]>> 340 **PFT 10/29/2017>> FVC was 41% predicted, increased to 58% with bronchodilator, FEV1 was 27% predicted increased to 43% with bronchodilator.  Ratio 62%.  TLC 88%, RV/TLC ratio was elevated.  DLCO is reduced at 61%.  Flow volume loop appears obstructed. - Overall this test shows severe obstructive lung disease, improvement after bronchodilator is likely in part due to effort dependence.  There is also severe air trapping.  These findings are consistent with severe COPD/emphysema. Social Hx:   Social History   Tobacco Use  . Smoking status: Current Every Day Smoker    Packs/day: 0.50    Years: 32.00    Pack years: 16.00    Types: Cigarettes  . Smokeless tobacco: Never Used  . Tobacco comment: Started smoking at age 46  Substance Use Topics  . Alcohol use: Yes    Alcohol/week: 0.0 standard drinks    Comment: occassionally  . Drug use: No   Medication:    Current Outpatient Medications:  .  AMBULATORY NON FORMULARY MEDICATION, Medication Name: nebulizer DX: J44.9, Disp: 1 each, Rfl: 0 .  diclofenac sodium (VOLTAREN) 1 % GEL, Apply topically., Disp: , Rfl:  .  doxycycline (VIBRA-TABS) 100 MG tablet, Take 1 tablet (100 mg total) by mouth 2 (two) times daily. Take existing Doxycycline - has 18 of 20 tablets left today 06/14/18, Disp: 20 tablet, Rfl: 0 .  esomeprazole (NEXIUM) 40 MG  capsule, Take 1 capsule (40 mg total) by mouth 2 (two) times daily., Disp: 90 capsule, Rfl: 3 .  Glycopyrrolate-Formoterol (BEVESPI AEROSPHERE) 9-4.8 MCG/ACT AERO, Inhale 2 puffs into the lungs 2 (two) times daily., Disp: 107 g, Rfl: 3 .  hydrOXYzine (ATARAX/VISTARIL) 25 MG tablet, Take 1 tablet (25 mg total) by mouth 3 (three) times daily as needed., Disp: 30 tablet, Rfl: 5 .  ipratropium (ATROVENT) 0.06 % nasal spray, Place 2 sprays into both nostrils 4 (four) times daily. For up to 5-7 days then stop., Disp: 15 mL, Rfl: 0 .  ipratropium-albuterol (DUONEB)  0.5-2.5 (3) MG/3ML SOLN, USE 1 VIAL (3ML TOTAL) BY NEBULIZATION EVERY 6 HOURS AS NEEDED FOR SHORTNESS OFBREATH AND WHEEZING., Disp: 360 mL, Rfl: 0 .  levothyroxine (SYNTHROID, LEVOTHROID) 50 MCG tablet, TAKE 1 TABLET BY MOUTH DAILY BEFORE BREAKFAST, Disp: 30 tablet, Rfl: 0 .  montelukast (SINGULAIR) 10 MG tablet, Take 1 tablet (10 mg total) by mouth at bedtime., Disp: 90 tablet, Rfl: 3 .  oxyCODONE-acetaminophen (PERCOCET/ROXICET) 5-325 MG tablet, Limit one half to 1 tab by mouth per day 2-3 times per day if tolerated a day if tolerated (Patient taking differently: 1 tablet 2 (two) times daily. Limit one half to 1 tab by mouth per day 2-3 times per day if tolerated a day if tolerated), Disp: 90 tablet, Rfl: 0 .  polyethylene glycol (MIRALAX / GLYCOLAX) packet, Take 17 g by mouth daily. Mix one tablespoon with 8oz of your favorite juice or water once daily if you didn't have a stool the day before., Disp: 30 each, Rfl: 2 .  pramipexole (MIRAPEX) 0.5 MG tablet, TAKE 1 TABLET BY MOUTH EVERY EVENING FORRESTLESS LEGS., Disp: 30 tablet, Rfl: 0 .  predniSONE (DELTASONE) 50 MG tablet, Take 1 tablet (50 mg total) by mouth daily with breakfast., Disp: 5 tablet, Rfl: 0 .  solifenacin (VESICARE) 5 MG tablet, Take 1 tablet (5 mg total) by mouth daily., Disp: 90 tablet, Rfl: 3 .  tizanidine (ZANAFLEX) 2 MG capsule, Limit 1 tab by mouth per day or 2 - 3 times a day if tolerated, Disp: 90 capsule, Rfl: 0 .  VENTOLIN HFA 108 (90 Base) MCG/ACT inhaler, INHALE 2 PUFFS BY MOUTH INTO THE LUNGS EVERY 6 HOURS AS NEEDED FOR WHEEZING OR SHORTNESS OF BREATH, Disp: 18 g, Rfl: 1   Allergies:  Penicillins; Ciprofloxacin; Aspirin; Hydrocodone; Other; and Tape  Review of Systems:  Constitutional: Feels well. Cardiovascular: Denies chest pain, exertional chest pain.  Pulmonary: Denies hemoptysis, pleuritic chest pain.   The remainder of systems were reviewed and were found to be negative other than what is documented in the HPI.     Physical Examination:   VS: BP 110/68 (BP Location: Left Arm, Cuff Size: Normal)   Pulse 76   Ht 5\' 1"  (1.549 m)   Wt 185 lb 9.6 oz (84.2 kg)   SpO2 98%   BMI 35.07 kg/m   General Appearance: No distress  Neuro:without focal findings, mental status, speech normal, alert and oriented HEENT: PERRLA, EOM intact Pulmonary: Scattered bilateral wheezing, reduced air entry in both lungs. CardiovascularNormal S1,S2.  No m/r/g.  Abdomen: Benign, Soft, non-tender, No masses Renal:  No costovertebral tenderness  GU:  No performed at this time. Endoc: No evident thyromegaly, no signs of acromegaly or Cushing features Skin:   warm, no rashes, no ecchymosis  Extremities: normal, no cyanosis, clubbing.      LABORATORY PANEL:   CBC No results for input(s): WBC, HGB, HCT, PLT  in the last 168 hours. ------------------------------------------------------------------------------------------------------------------  Chemistries  No results for input(s): NA, K, CL, CO2, GLUCOSE, BUN, CREATININE, CALCIUM, MG, AST, ALT, ALKPHOS, BILITOT in the last 168 hours.  Invalid input(s): GFRCGP ------------------------------------------------------------------------------------------------------------------  Cardiac Enzymes No results for input(s): TROPONINI in the last 168 hours. ------------------------------------------------------------  RADIOLOGY:  No results found.     Thank  you for the consultation and for allowing [facility] to assist in the care of your patient. Our recommendations are noted above.  Please contact us if we can be of further service.   Marda Stalker, M.D., F.C.C.P.  Board Certified in Internal Medicine, Pulmonary Medicine, [place], and Sleep Medicine.  [facility] Office Number: [phone]   07/19/2018

## 2018-07-20 ENCOUNTER — Ambulatory Visit (INDEPENDENT_AMBULATORY_CARE_PROVIDER_SITE_OTHER): Payer: Medicare Other | Admitting: Internal Medicine

## 2018-07-20 ENCOUNTER — Encounter: Payer: Self-pay | Admitting: Internal Medicine

## 2018-07-20 VITALS — BP 110/68 | HR 76 | Ht 61.0 in | Wt 185.6 lb

## 2018-07-20 DIAGNOSIS — J449 Chronic obstructive pulmonary disease, unspecified: Secondary | ICD-10-CM | POA: Diagnosis not present

## 2018-07-20 DIAGNOSIS — F17210 Nicotine dependence, cigarettes, uncomplicated: Secondary | ICD-10-CM | POA: Diagnosis not present

## 2018-07-20 NOTE — Patient Instructions (Addendum)
--  Quitting smoking is the most important thing that you can do for your health.  --Quitting smoking will have greater affect on your health than any medicine that we can give you.    Continue bevespi inhaler. Continue nebulizer as needed.

## 2018-07-22 ENCOUNTER — Emergency Department
Admission: EM | Admit: 2018-07-22 | Discharge: 2018-07-22 | Discharge status: Against Medical Advice | Payer: Medicare Other | Attending: Emergency Medicine | Admitting: Emergency Medicine

## 2018-07-22 ENCOUNTER — Telehealth: Payer: Self-pay

## 2018-07-22 ENCOUNTER — Encounter: Payer: Self-pay | Admitting: Emergency Medicine

## 2018-07-22 ENCOUNTER — Other Ambulatory Visit: Payer: Self-pay

## 2018-07-22 ENCOUNTER — Emergency Department: Payer: Medicare Other

## 2018-07-22 DIAGNOSIS — M791 Myalgia, unspecified site: Secondary | ICD-10-CM | POA: Diagnosis present

## 2018-07-22 DIAGNOSIS — Z5321 Procedure and treatment not carried out due to patient leaving prior to being seen by health care provider: Secondary | ICD-10-CM | POA: Diagnosis not present

## 2018-07-22 MED ORDER — ALBUTEROL SULFATE (2.5 MG/3ML) 0.083% IN NEBU
5.0000 mg | INHALATION_SOLUTION | Freq: Once | RESPIRATORY_TRACT | Status: AC
  Administered 2018-07-22: 5 mg via RESPIRATORY_TRACT
  Filled 2018-07-22: qty 6

## 2018-07-22 NOTE — Telephone Encounter (Signed)
Patient was instructed to go to ER.

## 2018-07-22 NOTE — ED Triage Notes (Signed)
Patient reports cough, congestion and body aches since yesterday. Reports her son has been sick with similar symptoms. Unsure of fever at home. Reports white sputum production.

## 2018-07-22 NOTE — Telephone Encounter (Signed)
The pt called complaining of SOB, vomiting because of coughing. The pt saw her Pulmonologist and was diagnose w/ COPD with chronic bronchitis and emphysema x 2 days.

## 2018-08-04 ENCOUNTER — Other Ambulatory Visit: Payer: Self-pay

## 2018-08-04 ENCOUNTER — Ambulatory Visit (INDEPENDENT_AMBULATORY_CARE_PROVIDER_SITE_OTHER): Payer: Medicare Other | Admitting: Nurse Practitioner

## 2018-08-04 ENCOUNTER — Encounter: Payer: Self-pay | Admitting: Nurse Practitioner

## 2018-08-04 VITALS — BP 120/67 | HR 70 | Temp 98.4°F | Resp 20 | Ht 61.0 in | Wt 186.8 lb

## 2018-08-04 DIAGNOSIS — R29898 Other symptoms and signs involving the musculoskeletal system: Secondary | ICD-10-CM | POA: Diagnosis not present

## 2018-08-04 DIAGNOSIS — L309 Dermatitis, unspecified: Secondary | ICD-10-CM

## 2018-08-04 MED ORDER — TRIAMCINOLONE ACETONIDE 0.025 % EX OINT: 1.0000 "application " | TOPICAL_OINTMENT | Freq: Two times a day (BID) | CUTANEOUS | 0 refills | Status: AC

## 2018-08-04 NOTE — Progress Notes (Signed)
Subjective:    Patient ID: Janet Murray, female    DOB: [birth year], 48 y.o.   MRN: [id number]  Janet Murray is a 48 y.o. 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 Has had many years since flare of TMJ.   - No precipitating events. - Is having clicking of jaw movements - no ear popping. - Patient is on oxycodone chronically and is not helping pain.  - Notes no increased jaw clenching, or tooth grinding that she is aware of.  Rash: - Now has rash/scarring with itching from adhesive. - White flaky rash on palm of right hand.  Interval updates: COPD exacerbation resolved. Aug stomach dysplasia [facility] and [facility]  Gastric nodule/polyp found on EGD. Thyroid radiation - Dr. Carmela Rima   Social History   Tobacco Use  . Smoking status: Current Every Day Smoker    Packs/day: 0.50    Years: 32.00    Pack years: 16.00    Types: Cigarettes  . Smokeless tobacco: Never Used  . Tobacco comment: Started smoking at age 37  Substance Use Topics  . Alcohol use: Yes    Alcohol/week: 0.0 standard drinks    Comment: occassionally  . Drug use: No    Review of Systems Per HPI unless specifically indicated above     Objective:    BP 120/67 (BP Location: Right Arm, Patient Position: Sitting, Cuff Size: Normal)   Pulse 70   Temp 98.4 F (36.9 C) (Oral)   Resp 20   Ht 5\' 1"  (1.549 m)   Wt 186 lb 12.8 oz (84.7 kg)   SpO2 99%   BMI 35.30 kg/m   Wt Readings from Last 3 Encounters:  08/04/18 186 lb 12.8 oz (84.7 kg)  07/22/18 185 lb 3 oz (84 kg)  07/20/18 185 lb 9.6 oz (84.2 kg)    Physical Exam Vitals signs reviewed.  Constitutional:      General: She is not in acute distress.    Appearance: She is well-developed.  HENT:     Head: Normocephalic and atraumatic.     Right Ear: Tympanic membrane, ear canal and external ear normal.       Left Ear: Tympanic membrane, ear canal and external ear normal.     Nose: Nose normal.     Mouth/Throat:     Mouth: Mucous membranes are moist.     Pharynx: Oropharynx is clear.     Comments: Patient has mildly limited mandibular movement.  There is mild crepitus at right TMJ.  No jaw locking present.  No pain with palpation. Eyes:     Conjunctiva/sclera: Conjunctivae normal.     Pupils: Pupils are equal, round, and reactive to light.  Cardiovascular:     Rate and Rhythm: Normal rate and regular rhythm.     Pulses:          Radial pulses are 2+ on the right side and 2+ on the left side.       Posterior tibial pulses are 1+ on the right side and 1+ on the left side.     Heart sounds: Normal heart sounds, S1 normal and S2 normal.  Pulmonary:     Effort: Pulmonary effort is normal. No respiratory distress.     Breath sounds: Normal breath sounds and air entry.  Musculoskeletal:     Right lower  leg: No edema.     Left lower leg: No edema.  Skin:    General: Skin is warm and dry.     Capillary Refill: Capillary refill takes less than 2 seconds.  Neurological:     Mental Status: She is alert and oriented to person, place, and time.  Psychiatric:        Attention and Perception: Attention normal.        Mood and Affect: Mood and affect normal.        Behavior: Behavior normal. Behavior is cooperative.      Results for orders placed or performed in visit on 04/22/18  Microscopic Examination  Result Value Ref Range   WBC, UA 6-10 (A) 0 - 5 /hpf   RBC, UA 11-30 (A) 0 - 2 /hpf   Epithelial Cells (non renal) 0-10 0 - 10 /hpf   Mucus, UA Present (A) Not Estab.   Bacteria, UA Many (A) None seen/Few  Urinalysis, Complete  Result Value Ref Range   Specific Gravity, UA 1.025 1.005 - 1.030   pH, UA 5.5 5.0 - 7.5   Color, UA Yellow Yellow   Appearance Ur Cloudy (A) Clear   Leukocytes, UA 2+ (A) Negative   Protein, UA Negative Negative/Trace   Glucose, UA Negative Negative    Ketones, UA Negative Negative   RBC, UA 2+ (A) Negative   Bilirubin, UA Negative Negative   Urobilinogen, Ur 0.2 0.2 - 1.0 mg/dL   Nitrite, UA Negative Negative   Microscopic Examination See below:       Assessment & Plan:   Problem List Items Addressed This Visit    None    Visit Diagnoses    Dermatitis    -  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  START triamcinolone ointment bid x 14 days.  Repeat if needed. Follow-up prn.    Relevant Medications   triamcinolone (KENALOG) 0.025 % ointment   TMJ click     Patient with history of past TMJ pain and clicking with acute worsening.  Plan: 1. Continue Tylenol prn - START using diclofenac gel bid regularly and up to four times daily at Right TMJ.  Patient not able to take NSAIDs due to fear of allergic response with tachycardia/palpitations. 2. May consider repeat trial prednisone - not helpful when taken or respiratory illness. 3. Recommend dentist to treat and follow as well. 4. Exercises and precautions provided.  Consider bite block at night. 5. Follow-up 2-4 weeks prn      Meds ordered this encounter  Medications  . triamcinolone (KENALOG) 0.025 % ointment    Sig: Apply 1 application topically 2 (two) times daily for 14 days.    Dispense:  30 g    Refill:  0    Order Specific Question:   Supervising Provider    Answer:   Olin Hauser [[id number]]    Follow up plan: Return if symptoms worsen or fail to improve.  [name], DNP, AGPCNP-BC Adult Gerontology Primary Care Nurse Practitioner [facility] 08/04/2018, 10:01 AM

## 2018-08-04 NOTE — Patient Instructions (Addendum)
Janet Murray,   Thank you for coming in to clinic today.  1. You do have TMJ disorder flare today. - START using your diclofenac gel twice daily or up to four times daily on your right jaw near ear. - This will help with your pain. - If not improving in 2 weeks, we will resume prednisone to help. - You may also benefit from seeing a dentist to treat if this lasts longer than 4-6 weeks.  Please schedule a follow-up appointment with [name], AGNP. Return if symptoms worsen or fail to improve.  If you have any other questions or concerns, please feel free to call the clinic or send a message through [employer]. You may also schedule an earlier appointment if necessary.  You will receive a survey after today's visit either digitally by e-mail or paper by [employer]. Your experiences and feedback matter to [place].  Please respond so we know how we are doing as we provide care for you.  [name], DNP, AGNP-BC Adult Gerontology Nurse Practitioner [facility], [facility]   Jaw Range of Motion Exercises Jaw range of motion exercises are exercises that help your jaw move better. Exercises that help you have good posture (postural exercises) also help relieve jaw discomfort. These are often done along with range of motion exercises. These exercises can help prevent or improve:  Difficulty opening your mouth.  Pain in your jaw while it is open or closed.  Temporomandibular joint (TMJ) pain.  Headache caused by jaw tension. Take other actions to prevent or relieve jaw pain, such as:  Avoiding things that cause or increase jaw pain. This may include: ? Chewing gum or eating hard foods. ? Clenching your jaw or teeth, grinding your teeth, or keeping tension in your jaw muscles. ? Opening your mouth wide, such as for a big yawn. ? Leaning on your jaw, such as resting your jaw in your hand while leaning on a desk.  Putting ice on your jaw. ? Put ice in a plastic bag. ? Place a  towel between your skin and the bag. ? Leave the ice on for 10-15 minutes, 2-3 times a day. Only do jaw exercises that your health care provider approves of. Only move your jaw as far as it can comfortably go in each direction. Do not move your jaw into positions that cause pain. Range of motion exercises Repeat each of these exercises 8 times, 1-2 times a day, or as told by your health care provider. Exercise A: Forward protrusion 1. Push your jaw forward. Hold this position for 1-2 seconds. 2. Allow your jaw to return to its normal position and rest it there for 1-2 seconds. Exercise B: Controlled opening 1. Stand or sit in front of a mirror. Place your tongue on the roof of your mouth, just behind your top teeth. 2. Keeping your tongue on the roof of your mouth, slowly open and close your mouth. 3. While you open and close your mouth, watch your jaw in the mirror. Try to keep your jaw from moving to one side or the other. Exercise C: Right and left motion 1. Move your jaw right. Hold this position for 1-2 seconds. Allow your jaw to return to its normal position, and rest it there for 1-2 seconds. 2. Move your jaw left. Hold this position for 1-2 seconds. Allow your jaw to return to its normal position, and rest it there for 1-2 seconds. Postural exercises Exercise A: Chin tucks 1.  You can do this exercise sitting, standing, or lying down. 2. Move your head straight back, keeping your head level. You can guide the movement by placing your fingers on your chin to push your jaw back in an even motion. You should be able to feel a double chin form at the end of the motion. 3. Hold this position for 5 seconds. Repeat 10-15 times. Exercise B: Shoulder blade squeeze 1. Sit or stand. 2. Bend your elbows to about 90 degrees, which is the shape of a capital letter "L." Keep your upper arms by your body. 3. Squeeze your shoulder blades down and back, as though you were trying to touch your elbows  behind you. Do not shrug your shoulders or move your head. 4. Hold this position for 5 seconds. Repeat 10-15 times. Exercise C: Chest stretch 1. Stand facing a corner. 2. Put both of your hands and your forearms on the wall, with your arms wide apart. 3. Make sure your arms are at a 90-degree angle to your body. This means that you should hold your arms straight out from your body, level with the floor. 4. Step in toward the corner. Do not lean in. 5. Hold this position for 30 seconds. Repeat 3 times. Contact a health care provider if you have:  Jaw pain that is new or gets worse.  Clicking or popping sounds while doing the exercises. Get help right away if:  Your jaw is stuck in one place and you cannot move it.  You cannot open or close your mouth. This information is not intended to replace advice given to you by your health care provider. Make sure you discuss any questions you have with your health care provider. Document Released: 05/08/2008 Document Revised: 04/22/2017 Document Reviewed: 04/22/2017 Elsevier Interactive Patient Education  2019 [employer].

## 2018-08-07 ENCOUNTER — Encounter: Payer: Self-pay | Admitting: Nurse Practitioner

## 2018-09-09 ENCOUNTER — Other Ambulatory Visit: Payer: Self-pay | Admitting: Nurse Practitioner

## 2018-09-09 ENCOUNTER — Other Ambulatory Visit: Payer: Self-pay | Admitting: Family Medicine

## 2018-09-09 ENCOUNTER — Other Ambulatory Visit: Payer: Self-pay | Admitting: Internal Medicine

## 2018-09-09 DIAGNOSIS — E042 Nontoxic multinodular goiter: Secondary | ICD-10-CM

## 2018-09-09 DIAGNOSIS — J301 Allergic rhinitis due to pollen: Secondary | ICD-10-CM

## 2018-09-09 DIAGNOSIS — G2581 Restless legs syndrome: Secondary | ICD-10-CM

## 2018-09-09 DIAGNOSIS — N3281 Overactive bladder: Secondary | ICD-10-CM

## 2018-09-09 DIAGNOSIS — E039 Hypothyroidism, unspecified: Secondary | ICD-10-CM

## 2018-09-09 DIAGNOSIS — K219 Gastro-esophageal reflux disease without esophagitis: Secondary | ICD-10-CM

## 2018-09-09 NOTE — Telephone Encounter (Signed)
Patient will need appt in about 2-3 months before any additional refills will be sent.  She needs regular follow-up for med safety.

## 2018-09-13 ENCOUNTER — Other Ambulatory Visit: Payer: Self-pay

## 2018-12-17 ENCOUNTER — Other Ambulatory Visit: Payer: Self-pay | Admitting: Family Medicine

## 2018-12-17 ENCOUNTER — Other Ambulatory Visit: Payer: Self-pay | Admitting: Nurse Practitioner

## 2018-12-17 DIAGNOSIS — J4540 Moderate persistent asthma, uncomplicated: Secondary | ICD-10-CM

## 2018-12-17 DIAGNOSIS — E042 Nontoxic multinodular goiter: Secondary | ICD-10-CM

## 2018-12-17 DIAGNOSIS — E039 Hypothyroidism, unspecified: Secondary | ICD-10-CM

## 2018-12-17 DIAGNOSIS — G2581 Restless legs syndrome: Secondary | ICD-10-CM

## 2018-12-17 DIAGNOSIS — J301 Allergic rhinitis due to pollen: Secondary | ICD-10-CM

## 2018-12-17 DIAGNOSIS — K219 Gastro-esophageal reflux disease without esophagitis: Secondary | ICD-10-CM

## 2019-01-24 ENCOUNTER — Other Ambulatory Visit: Payer: Self-pay

## 2019-01-24 ENCOUNTER — Encounter: Payer: Self-pay | Admitting: Nurse Practitioner

## 2019-01-24 ENCOUNTER — Ambulatory Visit (INDEPENDENT_AMBULATORY_CARE_PROVIDER_SITE_OTHER): Payer: Medicare Other | Admitting: Nurse Practitioner

## 2019-01-24 DIAGNOSIS — Z7189 Other specified counseling: Secondary | ICD-10-CM | POA: Diagnosis not present

## 2019-01-24 DIAGNOSIS — J0140 Acute pansinusitis, unspecified: Secondary | ICD-10-CM

## 2019-01-24 DIAGNOSIS — J40 Bronchitis, not specified as acute or chronic: Secondary | ICD-10-CM | POA: Diagnosis not present

## 2019-01-24 NOTE — Progress Notes (Signed)
Telemedicine Encounter: Disclosed to patient at start of encounter that we will provide appropriate telemedicine services.  Patient consents to be treated via phone prior to discussion. - Patient is at her home and is accessed via telephone. - Services are provided by [name] from [facility].  Subjective:    Patient ID: Janet Murray, female    DOB: 07/03/1970, 48 y.o.   MRN: [id number]  Janet Murray is a 48 y.o. female presenting on 01/24/2019 for Sinus Problem (bilateral ear drainage, fatigue, neck swelling and chest congestion the pt describes it as her chest feels heavy  x 3 days )  HPI Sinus problem Patient notes onset of ear drainage 3 days ago.  Patient notes bilateral ear drainage, fatigue, neck swelling and chest congestion.  Sharp pain in head.  Then chest feeling heavy and noted resolution of chest pain.  Occasionally lightheaded, sweaty with any activity.  Bilateral arms falling asleep/feel heavy. Patient states these are symptoms she felt the last time she was sick for visit with me on 04/09/2018.  She was diagnosed at that time with sinusitis and bronchitis.  Social History   Tobacco Use  . Smoking status: Current Every Day Smoker    Packs/day: 0.50    Years: 32.00    Pack years: 16.00    Types: Cigarettes  . Smokeless tobacco: Never Used  . Tobacco comment: Started smoking at age 9  Substance Use Topics  . Alcohol use: Yes    Alcohol/week: 0.0 standard drinks    Comment: occassionally  . Drug use: No    Review of Systems Per HPI unless specifically indicated above     Objective:    There were no vitals taken for this visit.  Wt Readings from Last 3 Encounters:  08/04/18 186 lb 12.8 oz (84.7 kg)  07/22/18 185 lb 3 oz (84 kg)  07/20/18 185 lb 9.6 oz (84.2 kg)    Physical Exam Patient remotely monitored.  Verbal communication appropriate.  Cognition normal.   Results for orders placed or performed in visit on 04/22/18  Microscopic  Examination   URINE  Result Value Ref Range   WBC, UA 6-10 (A) 0 - 5 /hpf   RBC, UA 11-30 (A) 0 - 2 /hpf   Epithelial Cells (non renal) 0-10 0 - 10 /hpf   Mucus, UA Present (A) Not Estab.   Bacteria, UA Many (A) None seen/Few  Urinalysis, Complete  Result Value Ref Range   Specific Gravity, UA 1.025 1.005 - 1.030   pH, UA 5.5 5.0 - 7.5   Color, UA Yellow Yellow   Appearance Ur Cloudy (A) Clear   Leukocytes, UA 2+ (A) Negative   Protein, UA Negative Negative/Trace   Glucose, UA Negative Negative   Ketones, UA Negative Negative   RBC, UA 2+ (A) Negative   Bilirubin, UA Negative Negative   Urobilinogen, Ur 0.2 0.2 - 1.0 mg/dL   Nitrite, UA Negative Negative   Microscopic Examination See below:       Assessment & Plan:   Problem List Items Addressed This Visit    None    Visit Diagnoses    Acute non-recurrent pansinusitis    -  Primary   Bronchitis       Educated About Covid-19 Virus Infection        Consistent with allergic rhinitis and secondary sinusitis with symptoms worsening over the past 3 days and initial symptoms of nasal congestion and sinus pressure over 2 weeks  ago.  Patient with signs and symptoms of fever given sweatiness/chills with activity.  Given chest heaviness, likely bronchitis but cannot exclude cardiac cause vs Covid-19.  Plan: 1.START taking doxycycline 100 mg tablets every 12 hours for 10 days.  Discussed completing antibiotic. - While on antibiotic, take a probiotic OTC or from food. - Start Atrovent nasal spray decongestant 2 sprays each nostril up to 4 times daily for 5-7 days - Continue anti-histamine loratadine 10mg  daily. - Can use Flonase 2 sprays each nostril daily for up to 4-6 weeks if no epistaxis. - Start Mucinex-DM OTC for  7-10 days prn congestion 2. Supportive care with nasal saline, warm herbal tea with honey, 3. Improve hydration 4. Tylenol / Motrin PRN fevers  5. Recommend testing for COVID-19.  Information provided about testing  location. 6. Reviewed signs and symptoms of heart attack.  Instructed patient to seek care in ER if needed for this.  If breathing problems continue, would recommend CXR at [facility] for evaluation if continues as outpatient.   7. Return criteria given.  Follow-up 5-7 days prn no improvement.  - Time spent in direct consultation with patient via telemedicine about above concerns: 7 minutes  Follow up plan: Follow-up 5-7 days prn.  [name], DNP, AGPCNP-BC Adult Gerontology Primary Care Nurse Practitioner [facility] 01/24/2019, 2:51 PM

## 2019-01-25 ENCOUNTER — Other Ambulatory Visit: Payer: Self-pay

## 2019-01-25 DIAGNOSIS — Z20822 Contact with and (suspected) exposure to covid-19: Secondary | ICD-10-CM

## 2019-01-26 LAB — NOVEL CORONAVIRUS, NAA: SARS-CoV-2, NAA: NOT DETECTED

## 2019-01-27 ENCOUNTER — Telehealth: Payer: Self-pay | Admitting: Nurse Practitioner

## 2019-01-27 DIAGNOSIS — J0140 Acute pansinusitis, unspecified: Secondary | ICD-10-CM

## 2019-01-27 MED ORDER — DOXYCYCLINE HYCLATE 100 MG PO TABS: 100.0000 mg | ORAL_TABLET | Freq: Two times a day (BID) | ORAL | 0 refills | Status: DC

## 2019-01-27 NOTE — Telephone Encounter (Signed)
Pt said that she spoke to you Monday said that an antibiotic was suppose to be called to her drug store.

## 2019-01-28 ENCOUNTER — Telehealth: Payer: Self-pay | Admitting: Nurse Practitioner

## 2019-01-28 NOTE — Telephone Encounter (Signed)
Negative COVID results given. Patient results "NOT Detected." Caller expressed understanding. ° °

## 2019-04-26 HISTORY — PX: ESOPHAGOGASTRODUODENOSCOPY: SHX1529

## 2019-05-11 ENCOUNTER — Other Ambulatory Visit: Payer: Self-pay | Admitting: Family Medicine

## 2019-05-11 DIAGNOSIS — G2581 Restless legs syndrome: Secondary | ICD-10-CM

## 2019-05-12 ENCOUNTER — Other Ambulatory Visit: Payer: Self-pay | Admitting: Family Medicine

## 2019-05-12 DIAGNOSIS — K219 Gastro-esophageal reflux disease without esophagitis: Secondary | ICD-10-CM

## 2019-06-08 ENCOUNTER — Other Ambulatory Visit: Payer: Self-pay

## 2019-06-08 ENCOUNTER — Other Ambulatory Visit: Payer: Self-pay | Admitting: Nurse Practitioner

## 2019-06-08 ENCOUNTER — Other Ambulatory Visit: Payer: Self-pay | Admitting: Family Medicine

## 2019-06-08 DIAGNOSIS — E039 Hypothyroidism, unspecified: Secondary | ICD-10-CM

## 2019-06-08 DIAGNOSIS — J301 Allergic rhinitis due to pollen: Secondary | ICD-10-CM

## 2019-06-08 DIAGNOSIS — J4540 Moderate persistent asthma, uncomplicated: Secondary | ICD-10-CM

## 2019-06-08 DIAGNOSIS — E042 Nontoxic multinodular goiter: Secondary | ICD-10-CM

## 2019-06-08 DIAGNOSIS — F410 Panic disorder [episodic paroxysmal anxiety] without agoraphobia: Secondary | ICD-10-CM

## 2019-06-08 MED ORDER — BEVESPI AEROSPHERE 9-4.8 MCG/ACT IN AERO: 2.0000 | INHALATION_SPRAY | Freq: Two times a day (BID) | RESPIRATORY_TRACT | 2 refills | Status: DC

## 2019-07-12 ENCOUNTER — Ambulatory Visit (INDEPENDENT_AMBULATORY_CARE_PROVIDER_SITE_OTHER): Payer: Medicare Other

## 2019-07-12 VITALS — BP 129/77 | HR 77 | Ht 61.0 in | Wt 188.0 lb

## 2019-07-12 DIAGNOSIS — Z0000 Encounter for general adult medical examination without abnormal findings: Secondary | ICD-10-CM | POA: Diagnosis not present

## 2019-07-12 NOTE — Patient Instructions (Signed)
Ms. Janet Murray , Thank you for taking time to come for your Medicare Wellness Visit. I appreciate your ongoing commitment to your health goals. Please review the following plan we discussed and let me know if I can assist you in the future.   Screening recommendations/referrals: Colonoscopy: up to date Mammogram: not indicated  Bone Density: not indicated  Recommended yearly ophthalmology/optometry visit for glaucoma screening and checkup Recommended yearly dental visit for hygiene and checkup  Vaccinations: Influenza vaccine: declined  Pneumococcal vaccine: not indicated  Tdap vaccine: up to date  Shingles vaccine: not indicated     Advanced directives: please pick up a copy of this information next time you are in the office   Conditions/risks identified: If you wish to quit smoking, help is available. For free tobacco cessation program offerings call the [facility] at [phone]) [phone] or Live Well Line at 9157084279. You may also visit [email or web address] or email livelifewell_0 .com for more information on other programs.   Next appointment: Follow up in one year for your annual wellness visit   Preventive Care 40-64 Years, Female Preventive care refers to lifestyle choices and visits with your health care provider that can promote health and wellness. What does preventive care include?  A yearly physical exam. This is also called an annual well check.  Dental exams once or twice a year.  Routine eye exams. Ask your health care provider how often you should have your eyes checked.  Personal lifestyle choices, including:  Daily care of your teeth and gums.  Regular physical activity.  Eating a healthy diet.  Avoiding tobacco and drug use.  Limiting alcohol use.  Practicing safe sex.  Taking low-dose aspirin daily starting at age 6.  Taking vitamin and mineral supplements as recommended by your health care provider. What happens during an annual  well check? The services and screenings done by your health care provider during your annual well check will depend on your age, overall health, lifestyle risk factors, and family history of disease. Counseling  Your health care provider may ask you questions about your:  Alcohol use.  Tobacco use.  Drug use.  Emotional well-being.  Home and relationship well-being.  Sexual activity.  Eating habits.  Work and work Statistician.  Method of birth control.  Menstrual cycle.  Pregnancy history. Screening  You may have the following tests or measurements:  Height, weight, and BMI.  Blood pressure.  Lipid and cholesterol levels. These may be checked every 5 years, or more frequently if you are over 34 years old.  Skin check.  Lung cancer screening. You may have this screening every year starting at age 21 if you have a 30-pack-year history of smoking and currently smoke or have quit within the past 15 years.  Fecal occult blood test (FOBT) of the stool. You may have this test every year starting at age 36.  Flexible sigmoidoscopy or colonoscopy. You may have a sigmoidoscopy every 5 years or a colonoscopy every 10 years starting at age 24.  Hepatitis C blood test.  Hepatitis B blood test.  Sexually transmitted disease (STD) testing.  Diabetes screening. This is done by checking your blood sugar (glucose) after you have not eaten for a while (fasting). You may have this done every 1-3 years.  Mammogram. This may be done every 1-2 years. Talk to your health care provider about when you should start having regular mammograms. This may depend on whether you have a family history of breast cancer.  BRCA-related cancer screening. This may be done if you have a family history of breast, ovarian, tubal, or peritoneal cancers.  Pelvic exam and Pap test. This may be done every 3 years starting at age 57. Starting at age 67, this may be done every 5 years if you have a Pap test in  combination with an HPV test.  Bone density scan. This is done to screen for osteoporosis. You may have this scan if you are at high risk for osteoporosis. Discuss your test results, treatment options, and if necessary, the need for more tests with your health care provider. Vaccines  Your health care provider may recommend certain vaccines, such as:  Influenza vaccine. This is recommended every year.  Tetanus, diphtheria, and acellular pertussis (Tdap, Td) vaccine. You may need a Td booster every 10 years.  Zoster vaccine. You may need this after age 79.  Pneumococcal 13-valent conjugate (PCV13) vaccine. You may need this if you have certain conditions and were not previously vaccinated.  Pneumococcal polysaccharide (PPSV23) vaccine. You may need one or two doses if you smoke cigarettes or if you have certain conditions. Talk to your health care provider about which screenings and vaccines you need and how often you need them. This information is not intended to replace advice given to you by your health care provider. Make sure you discuss any questions you have with your health care provider. Document Released: 06/22/2015 Document Revised: 02/13/2016 Document Reviewed: 03/27/2015 Elsevier Interactive Patient Education  2017 [place] Prevention in the Home Falls can cause injuries. They can happen to people of all ages. There are many things you can do to make your home safe and to help prevent falls. What can I do on the outside of my home?  Regularly fix the edges of walkways and driveways and fix any cracks.  Remove anything that might make you trip as you walk through a door, such as a raised step or threshold.  Trim any bushes or trees on the path to your home.  Use bright outdoor lighting.  Clear any walking paths of anything that might make someone trip, such as rocks or tools.  Regularly check to see if handrails are loose or broken. Make sure that both  sides of any steps have handrails.  Any raised decks and porches should have guardrails on the edges.  Have any leaves, snow, or ice cleared regularly.  Use sand or salt on walking paths during winter.  Clean up any spills in your garage right away. This includes oil or grease spills. What can I do in the bathroom?  Use night lights.  Install grab bars by the toilet and in the tub and shower. Do not use towel bars as grab bars.  Use non-skid mats or decals in the tub or shower.  If you need to sit down in the shower, use a plastic, non-slip stool.  Keep the floor dry. Clean up any water that spills on the floor as soon as it happens.  Remove soap buildup in the tub or shower regularly.  Attach bath mats securely with double-sided non-slip rug tape.  Do not have throw rugs and other things on the floor that can make you trip. What can I do in the bedroom?  Use night lights.  Make sure that you have a light by your bed that is easy to reach.  Do not use any sheets or blankets that are too big for your bed. They  should not hang down onto the floor.  Have a firm chair that has side arms. You can use this for support while you get dressed.  Do not have throw rugs and other things on the floor that can make you trip. What can I do in the kitchen?  Clean up any spills right away.  Avoid walking on wet floors.  Keep items that you use a lot in easy-to-reach places.  If you need to reach something above you, use a strong step stool that has a grab bar.  Keep electrical cords out of the way.  Do not use floor polish or wax that makes floors slippery. If you must use wax, use non-skid floor wax.  Do not have throw rugs and other things on the floor that can make you trip. What can I do with my stairs?  Do not leave any items on the stairs.  Make sure that there are handrails on both sides of the stairs and use them. Fix handrails that are broken or loose. Make sure that  handrails are as long as the stairways.  Check any carpeting to make sure that it is firmly attached to the stairs. Fix any carpet that is loose or worn.  Avoid having throw rugs at the top or bottom of the stairs. If you do have throw rugs, attach them to the floor with carpet tape.  Make sure that you have a light switch at the top of the stairs and the bottom of the stairs. If you do not have them, ask someone to add them for you. What else can I do to help prevent falls?  Wear shoes that:  Do not have high heels.  Have rubber bottoms.  Are comfortable and fit you well.  Are closed at the toe. Do not wear sandals.  If you use a stepladder:  Make sure that it is fully opened. Do not climb a closed stepladder.  Make sure that both sides of the stepladder are locked into place.  Ask someone to hold it for you, if possible.  Clearly mark and make sure that you can see:  Any grab bars or handrails.  First and last steps.  Where the edge of each step is.  Use tools that help you move around (mobility aids) if they are needed. These include:  Canes.  Walkers.  Scooters.  Crutches.  Turn on the lights when you go into a dark area. Replace any light bulbs as soon as they burn out.  Set up your furniture so you have a clear path. Avoid moving your furniture around.  If any of your floors are uneven, fix them.  If there are any pets around you, be aware of where they are.  Review your medicines with your doctor. Some medicines can make you feel dizzy. This can increase your chance of falling. Ask your doctor what other things that you can do to help prevent falls. This information is not intended to replace advice given to you by your health care provider. Make sure you discuss any questions you have with your health care provider. Document Released: 03/22/2009 Document Revised: 11/01/2015 Document Reviewed: 06/30/2014 Elsevier Interactive Patient Education  2017  [employer].

## 2019-07-12 NOTE — Progress Notes (Signed)
Subjective:   Janet Murray is a 49 y.o. female who presents for an Initial Medicare Annual Wellness Visit.  This visit is being conducted via phone call  - after an attmept to do on video chat - due to the COVID-19 pandemic. This patient has given me verbal consent via phone to conduct this visit, patient states they are participating from their home address. Some vital signs may be absent or patient reported.   Patient identification: identified by name, DOB, and current address.    Review of Systems           Objective:    Today's Vitals   07/12/19 1301  BP: 129/77  Pulse: 77  Weight: 188 lb (85.3 kg)  Height: 5\' 1"  (1.549 m)  PainSc: 7    Body mass index is 35.52 kg/m.  Advanced Directives 07/12/2019 07/22/2018 06/22/2017 03/24/2017 02/06/2016 02/04/2016 01/01/2016  Does Patient Have a Medical Advance Directive? No No No No No No No  Would patient like information on creating a medical advance directive? - - No - Patient declined No - Patient declined No - patient declined information - -    Current Medications (verified) Outpatient Encounter Medications as of 07/12/2019  Medication Sig  . AMBULATORY NON FORMULARY MEDICATION Medication Name: nebulizer DX: J44.9  . diclofenac (FLECTOR) 1.3 % PTCH   . esomeprazole (NEXIUM) 40 MG capsule TAKE ONE CAPSULE BY MOUTH TWICE A DAY  . Glycopyrrolate-Formoterol (BEVESPI AEROSPHERE) 9-4.8 MCG/ACT AERO Inhale 2 puffs into the lungs 2 (two) times daily.  . hydrOXYzine (ATARAX/VISTARIL) 25 MG tablet TAKE 1 TABLET BY MOUTH 3 TIMES DAILY AS NEEDED.  Marland Kitchen ipratropium-albuterol (DUONEB) 0.5-2.5 (3) MG/3ML SOLN USE 1 VIAL (3ML TOTAL) BY NEBULIZATION EVERY 6 HOURS AS NEEDED FOR SHORTNESS OFBREATH AND WHEEZING.  Marland Kitchen levothyroxine (SYNTHROID) 50 MCG tablet TAKE 1 TABLET BY MOUTH DAILY BEFORE BREAKFAST  . montelukast (SINGULAIR) 10 MG tablet TAKE 1 TABLET BY MOUTH AT BEDTIME  . oxyCODONE (OXY IR/ROXICODONE) 5 MG immediate release tablet   . tiZANidine  (ZANAFLEX) 2 MG tablet   . VENTOLIN HFA 108 (90 Base) MCG/ACT inhaler INHALE 2 PUFFS BY MOUTH INTO THE LUNGS EVERY 6 HOURS AS NEEDED FOR WHEEZING OR SHORTNESS OF BREATH  . VESICARE 5 MG tablet TAKE 1 TABLET BY MOUTH DAILY  . [DISCONTINUED] diclofenac sodium (VOLTAREN) 1 % GEL Apply topically.  . [DISCONTINUED] doxycycline (VIBRA-TABS) 100 MG tablet Take 1 tablet (100 mg total) by mouth 2 (two) times daily. (Patient not taking: Reported on 07/12/2019)  . [DISCONTINUED] ipratropium (ATROVENT) 0.06 % nasal spray Place 2 sprays into both nostrils 4 (four) times daily. For up to 5-7 days then stop. (Patient not taking: Reported on 07/12/2019)  . [DISCONTINUED] oxyCODONE-acetaminophen (PERCOCET/ROXICET) 5-325 MG tablet Limit one half to 1 tab by mouth per day 2-3 times per day if tolerated a day if tolerated (Patient not taking: Reported on 07/12/2019)  . [DISCONTINUED] polyethylene glycol (MIRALAX / GLYCOLAX) packet Take 17 g by mouth daily. Mix one tablespoon with 8oz of your favorite juice or water once daily if you didn't have a stool the day before. (Patient not taking: Reported on 01/24/2019)  . [DISCONTINUED] pramipexole (MIRAPEX) 0.5 MG tablet TAKE 1 TABLET BY MOUTH EVERY EVENING FORRESTLESS LEGS (Patient not taking: Reported on 07/12/2019)   No facility-administered encounter medications on file as of 07/12/2019.    Allergies (verified) Penicillins, Acetaminophen, Ciprofloxacin, Aspirin, Hydrocodone, Other, and Tape   History: Past Medical History:  Diagnosis Date  . Abdominal  mass, RUQ (right upper quadrant)   . Anxiety   . Arthritis   . Asthma   . Bronchitis   . Carpal tunnel syndrome of left wrist 10/25/2014  . Chronic bronchitis, obstructive ([place]) 07/20/2017  . Chronic pain   . COPD (chronic obstructive pulmonary disease) ([place])   . Cyst of right kidney 03/22/2018  . DDD (degenerative disc disease), lumbar 10/25/2014  . Degenerative joint disease (DJD) of hip   . Diarrhea of infectious origin  03/25/2018  . Dysphagia   . Emphysema of lung ([place])   . Facet syndrome, lumbar 10/25/2014  . Fatty liver 03/25/2018  . Foot fracture, left 05/02/15  . 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 GERD (gastroesophageal reflux disease)   . Hereditary and idiopathic peripheral neuropathy 11/06/2014  . IBS (irritable bowel syndrome)   . Low back pain   . Migraine 02/04/2016  . Occipital neuralgia   . 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 Other intervertebral disc degeneration, lumbar region   . Other irritable bowel syndrome 04/20/2017  . Other spondylosis with radiculopathy, lumbar region 10/25/2014  . RLS (restless legs syndrome)   . Sacroiliac joint dysfunction of both sides 10/25/2014  . Spondylosis of lumbar spine   . Urinary incontinence    Past Surgical History:  Procedure Laterality Date  . ABDOMINAL HYSTERECTOMY    . APPENDECTOMY    . BLADDER SURGERY     sling - Dr. Davis Gourd  . CESAREAN SECTION    . ESOPHAGOGASTRODUODENOSCOPY  04/26/2019   Family History  Problem Relation Age of Onset  . Neuropathy Mother   . Arthritis Father   . Hypertension Father   . Leukemia Father   . Heart disease Father        CAD s/p CABG  . Healthy Brother   . Healthy Daughter   . Healthy Son   . Dementia Maternal Grandmother   . Hypertension Maternal Grandmother   . Hypercholesterolemia Maternal Grandmother   . Heart disease Maternal Grandmother   . Healthy Brother   . Healthy Brother   . Stroke Neg Hx   . Cancer Neg Hx    Social History   Socioeconomic History  . Marital status: Single    Spouse name: Not on file  . Number of children: Not on file  . Years of education: Not on file  . Highest education level: Not on file  Occupational History  . Not on file  Tobacco Use  . Smoking status: Current Every Day Smoker    Packs/day: 0.50    Years: 32.00    Pack years: 16.00    Types: Cigarettes  . Smokeless tobacco: Never Used  . Tobacco comment: Started  smoking at age 59  Substance and Sexual Activity  . Alcohol use: Yes    Alcohol/week: 0.0 standard drinks    Comment: occassionally  . Drug use: No  . Sexual activity: Never  Other Topics Concern  . Not on file  Social History Narrative  . Not on file   Social Determinants of Health   Financial Resource Strain:   . Difficulty of Paying Living Expenses: Not on file  Food Insecurity:   . Worried About Charity fundraiser in the Last Year: Not on file  . Ran Out of Food in the Last Year: Not on file  Transportation Needs:   . Lack of Transportation (Medical): Not on file  . Lack of Transportation (Non-Medical): Not on file  Physical Activity:   .  Days of Exercise per Week: Not on file  . Minutes of Exercise per Session: Not on file  Stress:   . Feeling of Stress : Not on file  Social Connections:   . Frequency of Communication with Friends and Family: Not on file  . Frequency of Social Gatherings with Friends and Family: Not on file  . Attends Religious Services: Not on file  . Active Member of Clubs or Organizations: Not on file  . Attends Archivist Meetings: Not on file  . Marital Status: Not on file    Tobacco Counseling Ready to quit: No Counseling given: Yes Comment: Started smoking at age 3   Clinical Intake:  Pre-visit preparation completed: Yes  Pain : 0-10 Pain Score: 7  Pain Type: Chronic pain Pain Location: Back Pain Orientation: Lower Pain Descriptors / Indicators: Aching Pain Onset: More than a month ago Pain Frequency: Constant Pain Relieving Factors: pain medications, muscle relaxer  Pain Relieving Factors: pain medications, muscle relaxer  Nutritional Status: BMI > 30  Obese Diabetes: No  How often do you need to have someone help you when you read instructions, pamphlets, or other written materials from your doctor or pharmacy?: 1 - Never  Interpreter Needed?: No  Information entered by :: Ezio Wieck,LPN   Activities of  Daily Living In your present state of health, do you have any difficulty performing the following activities: 07/12/2019  Hearing? N  Comment difficulty in right ear, off and on for 2 months. no hearing aids  Vision? N  Comment no eye dr.  Difficulty concentrating or making decisions? N  Walking or climbing stairs? N  Dressing or bathing? N  Doing errands, shopping? N  Preparing Food and eating ? N  Using the Toilet? N  In the past six months, have you accidently leaked urine? N  Do you have problems with loss of bowel control? N  Managing your Medications? N  Managing your Finances? N  Housekeeping or managing your Housekeeping? N  Some recent data might be hidden     Immunizations and Health Maintenance Immunization History  Administered Date(s) Administered  . Td 07/10/2013   Health Maintenance Due  Topic Date Due  . HIV Screening  07/17/1985  . PAP SMEAR-Modifier  07/18/1991    Patient Care Team: Verl Bangs, FNP as PCP - General (Family Medicine) Jacolyn Reedy Sherrine Maples, MD as Consulting Physician (Anesthesiology) Jamelle Rushing, MD as Referring Physician (Dermatology) Sherri Rad, MD as Referring Physician (Gastroenterology)  Indicate any recent Medical Services you may have received from other than [facility] providers in the past year (date may be approximate).     Assessment:   This is a routine wellness examination for Janet Murray.  Hearing/Vision screen No exam data present  Dietary issues and exercise activities discussed:    Goals   None    Depression Screen PHQ 2/9 Scores 07/12/2019 06/14/2018 02/06/2016 12/19/2015 11/01/2015 10/19/2015 10/04/2015  PHQ - 2 Score 0 0 0 0 0 0 0  Exception Documentation - - - Patient refusal - - -    Fall Risk Fall Risk  07/12/2019 06/14/2018 02/06/2016 11/14/2015 11/01/2015  Falls in the past year? 0 0 No No No  Comment - - - - -  Number falls in past yr: 0 - - - -  Injury with Fall? 0 - - - -  Comment - - - - -  Risk  for fall due to : - - - - -  Follow up -  Falls evaluation completed - - -   FALL RISK PREVENTION PERTAINING TO THE HOME:  Any stairs in or around the home? No  If so, are there any without handrails? No   Home free of loose throw rugs in walkways, pet beds, electrical cords, etc? Yes  Adequate lighting in your home to reduce risk of falls? Yes   ASSISTIVE DEVICES UTILIZED TO PREVENT FALLS:  Life alert? No  Use of a cane, walker or w/c? Yes  cane, walker  Grab bars in the bathroom? No  Shower chair or bench in shower? No  Elevated toilet seat or a handicapped toilet? No    DME ORDERS:  DME order needed?  No   TIMED UP AND GO:  Unable to perform     Cognitive Function:        Screening Tests Health Maintenance  Topic Date Due  . HIV Screening  07/17/1985  . PAP SMEAR-Modifier  07/18/1991  . INFLUENZA VACCINE  09/07/2019 (Originally 01/08/2019)  . TETANUS/TDAP  07/11/2023    Qualifies for Shingles Vaccine? Not indicated   Tdap: up to date   Flu Vaccine: declined  Pneumococcal Vaccine: not indicated   Cancer Screenings:  Colorectal Screening: Completed   Mammogram: not indicated   Bone Density: not indicated   Lung Cancer Screening: (Low Dose CT Chest recommended if Age 24-80 years, 30 pack-year currently smoking OR have quit w/in 15years.) does not qualify.    Additional Screening:  Hepatitis C Screening: does not qualify  Vision Screening: Recommended annual ophthalmology exams for early detection of glaucoma and other disorders of the eye. Is the patient up to date with their annual eye exam?  Yes  Who is the provider or what is the name of the office in which the pt attends annual eye exams? Dr.Woodard    Dental Screening: Recommended annual dental exams for proper oral hygiene  Community Resource Referral:  CRR required this visit?  No       Plan:  I have personally reviewed and addressed the Medicare Annual Wellness questionnaire and  have noted the following in the patient's chart:  A. Medical and social history B. Use of alcohol, tobacco or illicit drugs  C. Current medications and supplements D. Functional ability and status E.  Nutritional status F.  Physical activity G. Advance directives H. List of other physicians I.  Hospitalizations, surgeries, and ER visits in previous 12 months J.  Economy such as hearing and vision if needed, cognitive and depression L. Referrals and appointments   In addition, I have reviewed and discussed with patient certain preventive protocols, quality metrics, and best practice recommendations. A written personalized care plan for preventive services as well as general preventive health recommendations were provided to patient.   Signed,    Bevelyn Ngo, LPN   [id number]  Nurse Health Advisor   Nurse Notes: none

## 2019-07-13 ENCOUNTER — Other Ambulatory Visit: Payer: Self-pay

## 2019-07-13 ENCOUNTER — Ambulatory Visit: Payer: Medicare Other | Admitting: Family Medicine

## 2019-07-20 ENCOUNTER — Other Ambulatory Visit: Payer: Self-pay | Admitting: Family Medicine

## 2019-07-20 DIAGNOSIS — R110 Nausea: Secondary | ICD-10-CM

## 2019-09-01 ENCOUNTER — Other Ambulatory Visit: Payer: Self-pay | Admitting: Family Medicine

## 2019-09-01 DIAGNOSIS — K219 Gastro-esophageal reflux disease without esophagitis: Secondary | ICD-10-CM

## 2019-09-01 DIAGNOSIS — J4540 Moderate persistent asthma, uncomplicated: Secondary | ICD-10-CM

## 2019-10-01 ENCOUNTER — Other Ambulatory Visit: Payer: Self-pay | Admitting: Internal Medicine

## 2019-10-26 ENCOUNTER — Ambulatory Visit (INDEPENDENT_AMBULATORY_CARE_PROVIDER_SITE_OTHER): Payer: Medicare Other | Admitting: Family Medicine

## 2019-10-26 ENCOUNTER — Other Ambulatory Visit: Payer: Self-pay

## 2019-10-26 ENCOUNTER — Encounter: Payer: Self-pay | Admitting: Family Medicine

## 2019-10-26 VITALS — BP 119/77 | HR 71 | Temp 98.7°F | Ht 61.5 in | Wt 190.0 lb

## 2019-10-26 DIAGNOSIS — E669 Obesity, unspecified: Secondary | ICD-10-CM | POA: Insufficient documentation

## 2019-10-26 DIAGNOSIS — J449 Chronic obstructive pulmonary disease, unspecified: Secondary | ICD-10-CM

## 2019-10-26 DIAGNOSIS — E039 Hypothyroidism, unspecified: Secondary | ICD-10-CM | POA: Diagnosis not present

## 2019-10-26 DIAGNOSIS — J301 Allergic rhinitis due to pollen: Secondary | ICD-10-CM

## 2019-10-26 DIAGNOSIS — N3281 Overactive bladder: Secondary | ICD-10-CM

## 2019-10-26 DIAGNOSIS — K219 Gastro-esophageal reflux disease without esophagitis: Secondary | ICD-10-CM

## 2019-10-26 DIAGNOSIS — M4726 Other spondylosis with radiculopathy, lumbar region: Secondary | ICD-10-CM

## 2019-10-26 DIAGNOSIS — G2581 Restless legs syndrome: Secondary | ICD-10-CM

## 2019-10-26 DIAGNOSIS — R739 Hyperglycemia, unspecified: Secondary | ICD-10-CM

## 2019-10-26 DIAGNOSIS — J4489 Other specified chronic obstructive pulmonary disease: Secondary | ICD-10-CM

## 2019-10-26 DIAGNOSIS — F410 Panic disorder [episodic paroxysmal anxiety] without agoraphobia: Secondary | ICD-10-CM

## 2019-10-26 DIAGNOSIS — R32 Unspecified urinary incontinence: Secondary | ICD-10-CM

## 2019-10-26 DIAGNOSIS — E66812 Obesity, class 2: Secondary | ICD-10-CM

## 2019-10-26 DIAGNOSIS — Z7689 Persons encountering health services in other specified circumstances: Secondary | ICD-10-CM | POA: Diagnosis not present

## 2019-10-26 DIAGNOSIS — K588 Other irritable bowel syndrome: Secondary | ICD-10-CM | POA: Diagnosis not present

## 2019-10-26 DIAGNOSIS — Q403 Congenital malformation of stomach, unspecified: Secondary | ICD-10-CM

## 2019-10-26 DIAGNOSIS — R7989 Other specified abnormal findings of blood chemistry: Secondary | ICD-10-CM

## 2019-10-26 DIAGNOSIS — Z6835 Body mass index (BMI) 35.0-35.9, adult: Secondary | ICD-10-CM

## 2019-10-26 DIAGNOSIS — E042 Nontoxic multinodular goiter: Secondary | ICD-10-CM

## 2019-10-26 DIAGNOSIS — E6609 Other obesity due to excess calories: Secondary | ICD-10-CM

## 2019-10-26 MED ORDER — ESOMEPRAZOLE MAGNESIUM 40 MG PO CPDR: 40.0000 mg | DELAYED_RELEASE_CAPSULE | Freq: Two times a day (BID) | ORAL | 1 refills | Status: DC

## 2019-10-26 MED ORDER — VESICARE 5 MG PO TABS: 5.0000 mg | ORAL_TABLET | Freq: Every day | ORAL | 1 refills | Status: DC

## 2019-10-26 MED ORDER — MONTELUKAST SODIUM 10 MG PO TABS: 10.0000 mg | ORAL_TABLET | Freq: Every day | ORAL | 1 refills | Status: DC

## 2019-10-26 MED ORDER — HYDROXYZINE HCL 25 MG PO TABS: 25.0000 mg | ORAL_TABLET | Freq: Three times a day (TID) | ORAL | 5 refills | Status: DC | PRN

## 2019-10-26 NOTE — Progress Notes (Signed)
BP 119/77   Pulse 71   Temp 98.7 F (37.1 C) (Oral)   Ht 5' 1.5" (1.562 m)   Wt 190 lb (86.2 kg)   SpO2 97%   BMI 35.32 kg/m    Subjective:    Patient ID: Janet Murray, female    DOB: 10/29/1970, 49 y.o.   MRN: [id number]  HPI: Janet Murray is a 49 y.o. female  Chief Complaint  Patient presents with  . Establish Care  . Medication Refill  . Referral    for back and hip problem   Here today to establish care.   Followed by Pulmonology for her COPD, on duonebs, bevespi and rescue inhaler without exacerbation recently.   Allergic rhinitis - on singulair with good control.   Anxiety with panic attacks - taking hydroxyzine prn. Used to be on valium but now that she's on pain medication she's come off of that. Tends to have once a week or sometimes more issues with anxiety. Has never been on a daily preventative medication for this.   Dr. Primus Bravo manages her chronic low back pain, has been stable for years on current regimen.   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 Needing referral for new Gi provider.   Hx of bladder sling, post pregnancy and hysterectomy started having bladder prolapse. Vesicare helping quite a bit with sxs.   Hx of hypothyroidism - last labs 2019.   Obesity - used to be in the 160s 2 years ago, now 190 lb. Trying to eat healthy, exercises as tolerated with her chronic pain conditions.   RLS - on mirapex with good relief.   Relevant past medical, surgical, family and social history reviewed and updated as indicated. Interim medical history since our last visit reviewed. Allergies and medications reviewed and updated.  Review of Systems  Per HPI unless specifically indicated above     Objective:    BP 119/77   Pulse 71   Temp 98.7 F (37.1 C) (Oral)   Ht 5' 1.5" (1.562 m)   Wt 190 lb (86.2 kg)   SpO2 97%   BMI 35.32 kg/m   Wt Readings from Last 3 Encounters:    10/26/19 190 lb (86.2 kg)  07/12/19 188 lb (85.3 kg)  08/04/18 186 lb 12.8 oz (84.7 kg)    Physical Exam Vitals and nursing note reviewed.  Constitutional:      Appearance: Normal appearance. She is not ill-appearing.  HENT:     Head: Atraumatic.  Eyes:     Extraocular Movements: Extraocular movements intact.     Conjunctiva/sclera: Conjunctivae normal.  Cardiovascular:     Rate and Rhythm: Normal rate and regular rhythm.     Heart sounds: Normal heart sounds.  Pulmonary:     Effort: Pulmonary effort is normal.     Breath sounds: Normal breath sounds.  Musculoskeletal:        General: Normal range of motion.     Cervical back: Normal range of motion and neck supple.  Skin:    General: Skin is warm and dry.  Neurological:     Mental Status: She is alert and oriented to person, place, and time.  Psychiatric:        Mood and Affect: Mood normal.        Thought Content: Thought content normal.        Judgment: Judgment normal.  Results for orders placed or performed in visit on 10/26/19  Comprehensive metabolic panel  Result Value Ref Range   Glucose 87 65 - 99 mg/dL   BUN 9 6 - 24 mg/dL   Creatinine, Ser 0.63 0.57 - 1.00 mg/dL   GFR calc non Af Amer 106 >59 mL/min/1.73   GFR calc Af Amer 122 >59 mL/min/1.73   BUN/Creatinine Ratio 14 9 - 23   Sodium 140 134 - 144 mmol/L   Potassium 4.5 3.5 - 5.2 mmol/L   Chloride 103 96 - 106 mmol/L   CO2 25 20 - 29 mmol/L   Calcium 9.4 8.7 - 10.2 mg/dL   Total Protein 6.7 6.0 - 8.5 g/dL   Albumin 4.3 3.8 - 4.8 g/dL   Globulin, Total 2.4 1.5 - 4.5 g/dL   Albumin/Globulin Ratio 1.8 1.2 - 2.2   Bilirubin Total <0.2 0.0 - 1.2 mg/dL   Alkaline Phosphatase 88 48 - 121 IU/L   AST 12 0 - 40 IU/L   ALT 5 0 - 32 IU/L  CBC with Differential/Platelet  Result Value Ref Range   WBC 6.6 3.4 - 10.8 x10E3/uL   RBC 4.06 3.77 - 5.28 x10E6/uL   Hemoglobin 13.6 11.1 - 15.9 g/dL   Hematocrit 41.1 34.0 - 46.6 %   MCV 101 (H) 79 - 97 fL   MCH  33.5 (H) 26.6 - 33.0 pg   MCHC 33.1 31.5 - 35.7 g/dL   RDW 12.2 11.7 - 15.4 %   Platelets CANCELED x10E3/uL   Neutrophils 58 Not Estab. %   Lymphs 29 Not Estab. %   Monocytes 8 Not Estab. %   Eos 4 Not Estab. %   Basos 1 Not Estab. %   Neutrophils Absolute 3.8 1.4 - 7.0 x10E3/uL   Lymphocytes Absolute 2.0 0.7 - 3.1 x10E3/uL   Monocytes Absolute 0.6 0.1 - 0.9 x10E3/uL   EOS (ABSOLUTE) 0.3 0.0 - 0.4 x10E3/uL   Basophils Absolute 0.1 0.0 - 0.2 x10E3/uL   Immature Granulocytes 0 Not Estab. %   Immature Grans (Abs) 0.0 0.0 - 0.1 x10E3/uL   Hematology Comments: Note:   TSH  Result Value Ref Range   TSH 17.000 (H) 0.450 - 4.500 uIU/mL  Lipid Panel w/o Chol/HDL Ratio  Result Value Ref Range   Cholesterol, Total 190 100 - 199 mg/dL   Triglycerides 126 0 - 149 mg/dL   HDL 54 >39 mg/dL   VLDL Cholesterol Cal 22 5 - 40 mg/dL   LDL Chol Calc (NIH) 114 (H) 0 - 99 mg/dL      Assessment & Plan:   Problem List Items Addressed This Visit      Respiratory   Chronic bronchitis, obstructive (HCC) - Primary    Stable and well controlled, continue per Pulmonology recommendations      Relevant Medications   montelukast (SINGULAIR) 10 MG tablet     Digestive   Gastric dysplasia    Referral back to GI placed for further monitoring      Other irritable bowel syndrome    Referral generated for GI to follow up on her IBS sxs and hx of dysplastic polyps. Continue symptomatic care in meantime      Relevant Medications   esomeprazole (NEXIUM) 40 MG capsule   Other Relevant Orders   Ambulatory referral to Gastroenterology     Endocrine   Acquired hypothyroidism    Recheck TSH, adjust as needed        Nervous and Auditory   Other spondylosis  with radiculopathy, lumbar region    Followed by Pain Mgmt, continue per their recommendations      Relevant Medications   hydrOXYzine (ATARAX/VISTARIL) 25 MG tablet     Other   Restless leg syndrome    Stable on mirapex, continue current  regimen      Obesity    Requesting to start saxenda pending normal labs after reviewing numerous options. Continue diet and exercise      Relevant Orders   Lipid Panel w/o Chol/HDL Ratio (Completed)   Urinary incontinence    Stable and well controlled on vesicare, continue current regimen      Relevant Medications   VESICARE 5 MG tablet   Panic attack    Fairly stable on prn hydroxyzine, continue current regimen      Relevant Medications   hydrOXYzine (ATARAX/VISTARIL) 25 MG tablet    Other Visit Diagnoses    Encounter to establish care       Hypothyroidism, unspecified type       Relevant Orders   TSH (Completed)   Abnormal CBC       Relevant Orders   CBC with Differential/Platelet (Completed)   Elevated blood sugar       Relevant Orders   Comprehensive metabolic panel (Completed)   OAB (overactive bladder)       Relevant Medications   VESICARE 5 MG tablet   Seasonal allergic rhinitis due to pollen       Relevant Medications   montelukast (SINGULAIR) 10 MG tablet   Gastroesophageal reflux disease       Relevant Medications   esomeprazole (NEXIUM) 40 MG capsule   Multiple thyroid nodules           Follow up plan: Return in about 4 weeks (around 11/23/2019) for weight follow up.

## 2019-10-27 ENCOUNTER — Telehealth: Payer: Self-pay | Admitting: Family Medicine

## 2019-10-27 LAB — CBC WITH DIFFERENTIAL/PLATELET
Basophils Absolute: 0.1 10*3/uL (ref 0.0–0.2)
Basos: 1 %
EOS (ABSOLUTE): 0.3 10*3/uL (ref 0.0–0.4)
Eos: 4 %
Hematocrit: 41.1 % (ref 34.0–46.6)
Hemoglobin: 13.6 g/dL (ref 11.1–15.9)
Immature Grans (Abs): 0 10*3/uL (ref 0.0–0.1)
Immature Granulocytes: 0 %
Lymphocytes Absolute: 2 10*3/uL (ref 0.7–3.1)
Lymphs: 29 %
MCH: 33.5 pg — ABNORMAL HIGH (ref 26.6–33.0)
MCHC: 33.1 g/dL (ref 31.5–35.7)
MCV: 101 fL — ABNORMAL HIGH (ref 79–97)
Monocytes Absolute: 0.6 10*3/uL (ref 0.1–0.9)
Monocytes: 8 %
Neutrophils Absolute: 3.8 10*3/uL (ref 1.4–7.0)
Neutrophils: 58 %
RBC: 4.06 x10E6/uL (ref 3.77–5.28)
RDW: 12.2 % (ref 11.7–15.4)
WBC: 6.6 10*3/uL (ref 3.4–10.8)

## 2019-10-27 LAB — LIPID PANEL W/O CHOL/HDL RATIO
Cholesterol, Total: 190 mg/dL (ref 100–199)
HDL: 54 mg/dL (ref >39)
LDL Chol Calc (NIH): 114 mg/dL — ABNORMAL HIGH (ref 0–99)
Triglycerides: 126 mg/dL (ref 0–149)
VLDL Cholesterol Cal: 22 mg/dL (ref 5–40)

## 2019-10-27 LAB — COMPREHENSIVE METABOLIC PANEL
ALT: 5 IU/L (ref 0–32)
AST: 12 IU/L (ref 0–40)
Albumin/Globulin Ratio: 1.8 (ref 1.2–2.2)
Albumin: 4.3 g/dL (ref 3.8–4.8)
Alkaline Phosphatase: 88 IU/L (ref 48–121)
BUN/Creatinine Ratio: 14 (ref 9–23)
BUN: 9 mg/dL (ref 6–24)
Bilirubin Total: <0.2 mg/dL (ref 0.0–1.2)
CO2: 25 mmol/L (ref 20–29)
Calcium: 9.4 mg/dL (ref 8.7–10.2)
Chloride: 103 mmol/L (ref 96–106)
Creatinine, Ser: 0.63 mg/dL (ref 0.57–1.00)
GFR calc Af Amer: 122 mL/min/{1.73_m2} (ref >59)
GFR calc non Af Amer: 106 mL/min/{1.73_m2} (ref >59)
Globulin, Total: 2.4 g/dL (ref 1.5–4.5)
Glucose: 87 mg/dL (ref 65–99)
Potassium: 4.5 mmol/L (ref 3.5–5.2)
Sodium: 140 mmol/L (ref 134–144)
Total Protein: 6.7 g/dL (ref 6.0–8.5)

## 2019-10-27 LAB — TSH: TSH: 17 u[IU]/mL — ABNORMAL HIGH (ref 0.450–4.500)

## 2019-10-27 NOTE — Telephone Encounter (Signed)
Called and left VM to return call regarding labs

## 2019-10-28 MED ORDER — PRAMIPEXOLE DIHYDROCHLORIDE 0.5 MG PO TABS: 0.5000 mg | ORAL_TABLET | Freq: Three times a day (TID) | ORAL | 1 refills | Status: DC

## 2019-10-28 MED ORDER — LEVOTHYROXINE SODIUM 75 MCG PO TABS
75.0000 ug | ORAL_TABLET | Freq: Every day | ORAL | 1 refills | Status: DC
Start: 2019-10-28 — End: 2020-04-04

## 2019-10-28 NOTE — Telephone Encounter (Signed)
Patient returned my call. Message relayed. Patient states that she still wants to try the saxenda.

## 2019-10-28 NOTE — Telephone Encounter (Signed)
Called and LVM for patient to return my call. °

## 2019-10-28 NOTE — Telephone Encounter (Signed)
Called and left VM to return call. If she calls back, please let her know her labs were stable other than her thyroid level was undertreated so I'm sending over a higher dose of synthroid in adition to her mirapex now that we have the dosing correct for that. Please see if she still wants to move forward with the saxenda. She may lose weight even without it just by [place] fixing her thyroid hormones more

## 2019-10-28 NOTE — Telephone Encounter (Signed)
Pt returning your call concerning lab results °

## 2019-10-28 NOTE — Telephone Encounter (Signed)
PT returned Ms. Rodena Murray call

## 2019-10-31 ENCOUNTER — Telehealth: Payer: Self-pay

## 2019-10-31 MED ORDER — PEN NEEDLES 32G X 4 MM MISC: 1.0000 [IU] | Freq: Every day | 0 refills | Status: DC

## 2019-10-31 MED ORDER — SAXENDA 18 MG/3ML ~~LOC~~ SOPN
PEN_INJECTOR | SUBCUTANEOUS | 0 refills | Status: AC
Start: 2019-10-31 — End: 2020-02-26

## 2019-10-31 NOTE — Telephone Encounter (Signed)
Called and left a detailed message for patient letting her know Rachel's response.

## 2019-10-31 NOTE — Telephone Encounter (Signed)
Saxenda sent, we will plan for a 1 month f/u if covered to check on weight and tolerance

## 2019-10-31 NOTE — Telephone Encounter (Signed)
Prior Authorization initiated via CoverMyMeds for VESIcare 5MG  tablets Key: [id number] PA Approved

## 2019-10-31 NOTE — Telephone Encounter (Signed)
Tiffany Reel already submitted PA. Routing to her for documentation.   Discard Key below.

## 2019-10-31 NOTE — Telephone Encounter (Signed)
PA submitted via Cover my meds for Saxenda 18mg /37ml, awaiting approval or denial.

## 2019-10-31 NOTE — Telephone Encounter (Signed)
Prior Authorization initiated via CoverMyMeds for Saxenda 18MG /3ML pen-injectors Key: [id number]  Awaiting progress note to be signed.

## 2019-11-01 ENCOUNTER — Ambulatory Visit: Payer: Medicare Other | Admitting: Adult Health

## 2019-11-01 DIAGNOSIS — R32 Unspecified urinary incontinence: Secondary | ICD-10-CM | POA: Insufficient documentation

## 2019-11-01 DIAGNOSIS — E039 Hypothyroidism, unspecified: Secondary | ICD-10-CM | POA: Insufficient documentation

## 2019-11-01 DIAGNOSIS — F410 Panic disorder [episodic paroxysmal anxiety] without agoraphobia: Secondary | ICD-10-CM | POA: Insufficient documentation

## 2019-11-01 NOTE — Assessment & Plan Note (Signed)
Recheck TSH, adjust as needed 

## 2019-11-01 NOTE — Assessment & Plan Note (Signed)
Referral back to GI placed for further monitoring

## 2019-11-01 NOTE — Assessment & Plan Note (Signed)
Followed by Pain Mgmt, continue per their recommendations 

## 2019-11-01 NOTE — Assessment & Plan Note (Signed)
Stable and well controlled on vesicare, continue current regimen

## 2019-11-01 NOTE — Assessment & Plan Note (Signed)
Fairly stable on prn hydroxyzine, continue current regimen

## 2019-11-01 NOTE — Assessment & Plan Note (Signed)
Stable on mirapex, continue current regimen

## 2019-11-01 NOTE — Assessment & Plan Note (Signed)
Requesting to start saxenda pending normal labs after reviewing numerous options. Continue diet and exercise

## 2019-11-01 NOTE — Assessment & Plan Note (Signed)
Stable and well controlled, continue per Pulmonology recommendations

## 2019-11-01 NOTE — Assessment & Plan Note (Signed)
Referral generated for GI to follow up on her IBS sxs and hx of dysplastic polyps. Continue symptomatic care in meantime

## 2019-11-08 NOTE — Telephone Encounter (Signed)
PA denied.  We received fax as to reasoning.

## 2019-12-09 ENCOUNTER — Telehealth: Payer: Self-pay

## 2019-12-09 NOTE — Telephone Encounter (Signed)
PA for Saxenda initiated and submitted via Cover My Meds. Key: [id number]

## 2019-12-13 ENCOUNTER — Ambulatory Visit (INDEPENDENT_AMBULATORY_CARE_PROVIDER_SITE_OTHER): Payer: Medicare Other | Admitting: Adult Health

## 2019-12-13 ENCOUNTER — Other Ambulatory Visit: Payer: Self-pay

## 2019-12-13 ENCOUNTER — Encounter: Payer: Self-pay | Admitting: Adult Health

## 2019-12-13 VITALS — BP 120/78 | HR 78 | Temp 98.0°F | Ht 61.0 in | Wt 190.2 lb

## 2019-12-13 DIAGNOSIS — J4540 Moderate persistent asthma, uncomplicated: Secondary | ICD-10-CM

## 2019-12-13 DIAGNOSIS — J449 Chronic obstructive pulmonary disease, unspecified: Secondary | ICD-10-CM | POA: Diagnosis not present

## 2019-12-13 DIAGNOSIS — J418 Mixed simple and mucopurulent chronic bronchitis: Secondary | ICD-10-CM | POA: Diagnosis not present

## 2019-12-13 DIAGNOSIS — J439 Emphysema, unspecified: Secondary | ICD-10-CM | POA: Insufficient documentation

## 2019-12-13 DIAGNOSIS — Z720 Tobacco use: Secondary | ICD-10-CM | POA: Insufficient documentation

## 2019-12-13 MED ORDER — BEVESPI AEROSPHERE 9-4.8 MCG/ACT IN AERO: 2.0000 | INHALATION_SPRAY | Freq: Two times a day (BID) | RESPIRATORY_TRACT | 2 refills | Status: DC

## 2019-12-13 MED ORDER — IPRATROPIUM-ALBUTEROL 0.5-2.5 (3) MG/3ML IN SOLN: RESPIRATORY_TRACT | 0 refills | Status: DC

## 2019-12-13 MED ORDER — VENTOLIN HFA 108 (90 BASE) MCG/ACT IN AERS: INHALATION_SPRAY | RESPIRATORY_TRACT | 0 refills | Status: DC

## 2019-12-13 NOTE — Assessment & Plan Note (Signed)
Smoking cessation discussed 

## 2019-12-13 NOTE — Assessment & Plan Note (Signed)
COPD currently, present regimen. Smoking cessation encouraged Alpha-1 testing Chest x-ray today Plan  Patient Instructions  Continue on BEVESPI 2 puffs Twice daily , rinse after use.  Ventolin /albuterol As needed   Activity as tolerated.  Labs and Chest xray today .  Work on not smoking .  Follow up with Dr. Patsey Berthold in 6 months and As needed

## 2019-12-13 NOTE — Patient Instructions (Addendum)
Continue on BEVESPI 2 puffs Twice daily , rinse after use.  Ventolin /albuterol As needed   Activity as tolerated.  Labs and Chest xray today .  Work on not smoking .  Follow up with Dr. Patsey Berthold in 6 months and As needed

## 2019-12-13 NOTE — Progress Notes (Signed)
@Patient  ID: Janet Murray, female    DOB: 03-02-1971, 49 y.o.   MRN: [id number]  Chief Complaint  Patient presents with  . Follow-up    COPD     Referring provider: Volney American,*  HPI: 49 year old female active smoker followed for severe COPD with emphysema Chronic back pain s/p multiple surgeries- on disability . On chronic narcotics   TEST/EVENTS :    12/13/2019 Follow up : COPD /Emphysema  Patient presents for a follow-up visit.  Last seen February 2020.  Former Dr. Ashby Dawes patient .  Patient has known severe COPD and emphysema.  Previous PFTs May 2019 showed FEV1 at 43%, ratio 62, FVC 58%, significant bronchodilator response with a 58% change (FEV1 prebronchodilator was 27%) DLCO 61%. Chest x-ray February 2020 was clear. She remains on Bevespi twice daily uses Ventolin on occasion.  She is also on Singulair daily. Rare use of Ventolin.  She says she is active around the house, does housework. Can drive. Lives alone.  Not on oxygen . O2 sats 98% on room air.  Says overall she feels that she is doing well.  She is had no flare of her cough or congestion.  She says she does her activities but at a slower pace .  She denies any increased shortness of breath or decrease in activity tolerance  Patient continues to smoke 1/2 pack cigarettes a day.  Smoking cessation encouraged     Allergies  Allergen Reactions  . Penicillins Hives and Swelling    Has patient had a PCN reaction causing immediate rash, facial/tongue/throat swelling, SOB or lightheadedness with hypotension: Yes Has patient had a PCN reaction causing severe rash involving mucus membranes or skin necrosis: No Has patient had a PCN reaction that required hospitalization No Has patient had a PCN reaction occurring within the last 10 years: No If all of the above answers are "NO", then may proceed with Cephalosporin use.  . Acetaminophen     Due to polyps   . Ciprofloxacin Itching  . Aspirin  Palpitations    Heart palpitations  . Hydrocodone Hives and Rash  . Other Rash    bandaids  . Tape Rash    bandaids bandaids    Immunization History  Administered Date(s) Administered  . Td 07/10/2013    Past Medical History:  Diagnosis Date  . Abdominal mass, RUQ (right upper quadrant)   . Anxiety   . Arthritis   . Asthma   . Bronchitis   . Carpal tunnel syndrome of left wrist 10/25/2014  . Chronic bronchitis, obstructive ([place]) 07/20/2017  . Chronic pain   . COPD (chronic obstructive pulmonary disease) ([place])   . Cyst of right kidney 03/22/2018  . DDD (degenerative disc disease), lumbar 10/25/2014  . Degenerative joint disease (DJD) of hip   . Diarrhea of infectious origin 03/25/2018  . Dysphagia   . Emphysema of lung ([place])   . Facet syndrome, lumbar 10/25/2014  . Fatty liver 03/25/2018  . Foot fracture, left 05/02/15  . 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 GERD (gastroesophageal reflux disease)   . Hereditary and idiopathic peripheral neuropathy 11/06/2014  . IBS (irritable bowel syndrome)   . Low back pain   . Migraine 02/04/2016  . Occipital neuralgia   . 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 Other intervertebral disc degeneration, lumbar region   . Other irritable bowel syndrome 04/20/2017  . Other spondylosis with radiculopathy, lumbar region 10/25/2014  . RLS (restless legs syndrome)   .  Sacroiliac joint dysfunction of both sides 10/25/2014  . Spondylosis of lumbar spine   . Urinary incontinence     Tobacco History: Social History   Tobacco Use  Smoking Status Current Every Day Smoker  . Packs/day: 0.50  . Years: 32.00  . Pack years: 16.00  . Types: Cigarettes  Smokeless Tobacco Never Used  Tobacco Comment   Started smoking at age 32   Ready to quit: No Counseling given: Yes Comment: Started smoking at age 30   Outpatient Medications Prior to Visit  Medication Sig Dispense Refill  . AMBULATORY NON FORMULARY MEDICATION  Medication Name: nebulizer DX: J44.9 1 each 0  . esomeprazole (NEXIUM) 40 MG capsule Take 1 capsule (40 mg total) by mouth 2 (two) times daily. 180 capsule 1  . hydrOXYzine (ATARAX/VISTARIL) 25 MG tablet Take 1 tablet (25 mg total) by mouth 3 (three) times daily as needed. 30 tablet 5  . Insulin Pen Needle (PEN NEEDLES) 32G X 4 MM MISC 1 Units by Does not apply route daily. 100 each 0  . levothyroxine (SYNTHROID) 75 MCG tablet Take 1 tablet (75 mcg total) by mouth daily. 30 tablet 1  . montelukast (SINGULAIR) 10 MG tablet Take 1 tablet (10 mg total) by mouth at bedtime. 90 tablet 1  . ondansetron (ZOFRAN-ODT) 4 MG disintegrating tablet TAKE 1 TABLET BY MOUTH EVERY 8 HOURS AS NEEDED FOR NAUSEA 14 tablet 0  . oxyCODONE (OXY IR/ROXICODONE) 5 MG immediate release tablet Take by mouth. Taking 2-3 times daily as needed    . pramipexole (MIRAPEX) 0.5 MG tablet Take 1 tablet (0.5 mg total) by mouth 3 (three) times daily. 360 tablet 1  . tiZANidine (ZANAFLEX) 2 MG tablet Take by mouth 3 (three) times daily.     . VESICARE 5 MG tablet Take 1 tablet (5 mg total) by mouth daily. 90 tablet 1  . Glycopyrrolate-Formoterol (BEVESPI AEROSPHERE) 9-4.8 MCG/ACT AERO Inhale 2 puffs into the lungs 2 (two) times daily. 10.7 g 2  . ipratropium-albuterol (DUONEB) 0.5-2.5 (3) MG/3ML SOLN USE 1 VIAL (3ML TOTAL) BY NEBULIZATION EVERY 6 HOURS AS NEEDED FOR SHORTNESS OFBREATH AND WHEEZING. 360 mL 0  . VENTOLIN HFA 108 (90 Base) MCG/ACT inhaler INHALE 2 PUFFS BY MOUTH INTO THE LUNGS EVERY 6 HOURS AS NEEDED FOR WHEEZING OR SHORTNESS OF BREATH 18 g 0  . diclofenac (FLECTOR) 1.3 % PTCH  (Patient not taking: Reported on 12/13/2019)    . Liraglutide -Weight Management (SAXENDA) 18 MG/3ML SOPN Inject 0.1 mLs (0.6 mg total) into the skin daily for 7 days, THEN 0.2 mLs (1.2 mg total) daily for 7 days, THEN 0.3 mLs (1.8 mg total) daily for 7 days, THEN 0.4 mLs (2.4 mg total) daily for 7 days, THEN 0.5 mLs (3 mg total) daily. (Patient not  taking: Reported on 12/13/2019) 3 mL 0   No facility-administered medications prior to visit.     Review of Systems:   Constitutional:   No  weight loss, night sweats,  Fevers, chills, + fatigue, or  lassitude.  HEENT:   No headaches,  Difficulty swallowing,  Tooth/dental problems, or  Sore throat,                No sneezing, itching, ear ache, nasal congestion, post nasal drip,   CV:  No chest pain,  Orthopnea, PND, swelling in lower extremities, anasarca, dizziness, palpitations, syncope.   GI  No heartburn, indigestion, abdominal pain, nausea, vomiting, diarrhea, change in bowel habits, loss of appetite, bloody stools.  Resp:  No excess mucus, no productive cough,  No non-productive cough,  No coughing up of blood.  No change in color of mucus.  No wheezing.  No chest wall deformity  Skin: no rash or lesions.  GU: no dysuria, change in color of urine, no urgency or frequency.  No flank pain, no hematuria   MS: Chronic back pain  Physical Exam  BP 120/78 (BP Location: Left Arm, Cuff Size: Normal)   Pulse 78   Temp 98 F (36.7 C) (Temporal)   Ht 5\' 1"  (1.549 m)   Wt 190 lb 3.2 oz (86.3 kg)   SpO2 98%   BMI 35.94 kg/m   GEN: A/Ox3; pleasant , NAD, well nourished    HEENT:  [place]/AT,  EACs-clear, TMs-wnl, NOSE-clear, THROAT-clear, no lesions, no postnasal drip or exudate noted.   NECK:  Supple w/ fair ROM; no JVD; normal carotid impulses w/o bruits; no thyromegaly or nodules palpated; no lymphadenopathy.    RESP  Clear  P & A; w/o, wheezes/ rales/ or rhonchi. no accessory muscle use, no dullness to percussion  CARD:  RRR, no m/r/g, no peripheral edema, pulses intact, no cyanosis or clubbing.  GI:   Soft & nt; nml bowel sounds; no organomegaly or masses detected.   Musco: Warm bil, no deformities or joint swelling noted.   Neuro: alert, no focal deficits noted.    Skin: Warm, no lesions or rashes    Lab Results:  BMET   BNP  ProBNP No results found for:  PROBNP  Imaging: No results found.    No flowsheet data found.  No results found for: NITRICOXIDE      Assessment & Plan:   COPD (chronic obstructive pulmonary disease) with emphysema ([place]) COPD currently, present regimen. Smoking cessation encouraged Alpha-1 testing Chest x-ray today Plan  Patient Instructions  Continue on BEVESPI 2 puffs Twice daily , rinse after use.  Ventolin /albuterol As needed   Activity as tolerated.  Labs and Chest xray today .  Work on not smoking .  Follow up with Dr. Patsey Berthold in 6 months and As needed        Tobacco abuse Smoking cessation discussed     Rexene Edison, NP 12/13/2019

## 2019-12-14 NOTE — Telephone Encounter (Signed)
PA denied.

## 2019-12-14 NOTE — Progress Notes (Signed)
Prior patient of Dr. Mathis Fare.  COPD with emphysema.  6 agree with the aspects of the visit as noted below.  Janet Don, MD [facility]

## 2019-12-20 ENCOUNTER — Ambulatory Visit
Admission: RE | Admit: 2019-12-20 | Discharge: 2019-12-20 | Disposition: A | Discharge status: Home / Self Care | Payer: Medicare Other | Source: Ambulatory Visit | Attending: Adult Health | Admitting: Adult Health

## 2019-12-20 ENCOUNTER — Other Ambulatory Visit: Payer: Self-pay

## 2019-12-20 ENCOUNTER — Other Ambulatory Visit
Admission: RE | Admit: 2019-12-20 | Discharge: 2019-12-20 | Disposition: A | Discharge status: Home / Self Care | Payer: Medicare Other | Source: Home / Self Care | Attending: Adult Health | Admitting: Adult Health

## 2019-12-20 ENCOUNTER — Ambulatory Visit
Admission: RE | Admit: 2019-12-20 | Discharge: 2019-12-20 | Disposition: A | Discharge status: Home / Self Care | Payer: Medicare Other | Attending: Adult Health | Admitting: Adult Health

## 2019-12-20 DIAGNOSIS — J449 Chronic obstructive pulmonary disease, unspecified: Secondary | ICD-10-CM | POA: Diagnosis present

## 2019-12-23 LAB — ALPHA-1 ANTITRYPSIN PHENOTYPE: A-1 Antitrypsin, Ser: 131 mg/dL (ref 101–187)

## 2019-12-29 ENCOUNTER — Telehealth: Payer: Self-pay | Admitting: Adult Health

## 2019-12-29 NOTE — Telephone Encounter (Signed)
Melvenia Needles, NP  12/21/2019 8:49 PM EDT     Chest xray is okay , no acute process  Continue with office visit recommendations and follow up .     Melvenia Needles, NP  12/23/2019 2:34 PM EDT     Alpha one phenotype is normal  Cont w/ ov recs and follow up    Pt is aware of results and voiced her understanding.  Nothing further is needed.

## 2020-01-04 ENCOUNTER — Encounter: Payer: Self-pay | Admitting: *Deleted

## 2020-01-12 ENCOUNTER — Other Ambulatory Visit: Payer: Self-pay | Admitting: Adult Health

## 2020-01-12 DIAGNOSIS — J4540 Moderate persistent asthma, uncomplicated: Secondary | ICD-10-CM

## 2020-01-13 ENCOUNTER — Other Ambulatory Visit: Payer: Self-pay | Admitting: Adult Health

## 2020-01-13 DIAGNOSIS — J4540 Moderate persistent asthma, uncomplicated: Secondary | ICD-10-CM

## 2020-01-17 ENCOUNTER — Telehealth: Payer: Self-pay | Admitting: Family Medicine

## 2020-01-17 NOTE — Telephone Encounter (Signed)
They should be able to order and schedule the MRI that they are needing  Copied from [place] [id number]. Topic: General - Inquiry >> Jan 17, 2020  9:02 AM Greggory Keen D wrote: Reason for CRM: Pt called saying she went to her pain Dr and they wanted the office to set up an open MRI of her back.  Pt said to call and speak to Ann at [phone]. >> Jan 17, 2020  9:10 AM Jill Side wrote: Please advise

## 2020-01-17 NOTE — Telephone Encounter (Signed)
Called and spoke to [place] as requested by the patient. Let her know what was going on. We agreed that is was not clear as to what was needed for this patient. She stated that "Damita Dunnings" was not in and that she would pass this information along to him and that we would get a call back tomorrow.

## 2020-01-19 ENCOUNTER — Ambulatory Visit (INDEPENDENT_AMBULATORY_CARE_PROVIDER_SITE_OTHER): Payer: Medicare Other | Admitting: Family Medicine

## 2020-01-19 ENCOUNTER — Other Ambulatory Visit: Payer: Self-pay

## 2020-01-19 ENCOUNTER — Encounter: Payer: Self-pay | Admitting: Family Medicine

## 2020-01-19 VITALS — BP 128/85 | HR 60 | Temp 98.6°F | Wt 187.0 lb

## 2020-01-19 DIAGNOSIS — R1011 Right upper quadrant pain: Secondary | ICD-10-CM | POA: Diagnosis not present

## 2020-01-19 DIAGNOSIS — R110 Nausea: Secondary | ICD-10-CM

## 2020-01-19 DIAGNOSIS — E6609 Other obesity due to excess calories: Secondary | ICD-10-CM

## 2020-01-19 DIAGNOSIS — Z6835 Body mass index (BMI) 35.0-35.9, adult: Secondary | ICD-10-CM | POA: Diagnosis not present

## 2020-01-19 MED ORDER — ONDANSETRON 4 MG PO TBDP: 4.0000 mg | ORAL_TABLET | Freq: Three times a day (TID) | ORAL | 0 refills | Status: DC | PRN

## 2020-01-19 MED ORDER — VICTOZA 18 MG/3ML ~~LOC~~ SOPN
1.8000 mg | PEN_INJECTOR | Freq: Every day | SUBCUTANEOUS | 2 refills | Status: DC
Start: 2020-01-19 — End: 2021-01-14

## 2020-01-19 NOTE — Progress Notes (Signed)
BP 128/85   Pulse 60   Temp 98.6 F (37 C) (Oral)   Wt 187 lb (84.8 kg)   SpO2 98%   BMI 35.33 kg/m    Subjective:    Patient ID: Janet Murray, female    DOB: 27-Apr-1971, 49 y.o.   MRN: [id number]  HPI: Janet Murray is a 49 y.o. female  Chief Complaint  Patient presents with  . Abdominal Pain    uupper right side x 4 days  . Back Pain    lower back   Nagging, pulling pain constantly for about 4 days now in the RUQ. Pain does not seem to get worse after eating or moving certain ways. Does note diarrhea after eating but does have intermittent issues with IBS. Trying nexium and pain pills and muscle relaxers with minimal relief. Denies fever, chills, unexpected weight loss, melena, hematemesis.   Relevant past medical, surgical, family and social history reviewed and updated as indicated. Interim medical history since our last visit reviewed. Allergies and medications reviewed and updated.  Review of Systems  Per HPI unless specifically indicated above     Objective:    BP 128/85   Pulse 60   Temp 98.6 F (37 C) (Oral)   Wt 187 lb (84.8 kg)   SpO2 98%   BMI 35.33 kg/m   Wt Readings from Last 3 Encounters:  01/19/20 187 lb (84.8 kg)  12/13/19 190 lb 3.2 oz (86.3 kg)  10/26/19 190 lb (86.2 kg)    Physical Exam Vitals and nursing note reviewed.  Constitutional:      Appearance: Normal appearance. She is not ill-appearing.  HENT:     Head: Atraumatic.  Eyes:     Extraocular Movements: Extraocular movements intact.     Conjunctiva/sclera: Conjunctivae normal.  Cardiovascular:     Rate and Rhythm: Normal rate and regular rhythm.     Heart sounds: Normal heart sounds.  Pulmonary:     Effort: Pulmonary effort is normal.     Breath sounds: Normal breath sounds.  Abdominal:     General: Bowel sounds are normal. There is no distension.     Palpations: Abdomen is soft.     Tenderness: There is abdominal tenderness (RUQ and epigastric ttp). There is no right CVA  tenderness, left CVA tenderness, guarding or rebound.  Musculoskeletal:        General: Normal range of motion.     Cervical back: Normal range of motion and neck supple.  Skin:    General: Skin is warm and dry.  Neurological:     Mental Status: She is alert and oriented to person, place, and time.  Psychiatric:        Mood and Affect: Mood normal.        Thought Content: Thought content normal.        Judgment: Judgment normal.     Results for orders placed or performed in visit on 01/19/20  Microscopic Examination   Urine  Result Value Ref Range   WBC, UA 6-10 (A) 0 - 5 /hpf   RBC 11-30 (A) 0 - 2 /hpf   Epithelial Cells (non renal) 0-10 0 - 10 /hpf   Bacteria, UA None seen None seen/Few  Urine Culture, Reflex   Urine  Result Value Ref Range   Urine Culture, Routine Final report    Organism ID, Bacteria Comment   CBC with Differential/Platelet  Result Value Ref Range   WBC 7.1 3.4 - 10.8 x10E3/uL  RBC 4.22 3.77 - 5.28 x10E6/uL   Hemoglobin 14.3 11.1 - 15.9 g/dL   Hematocrit 42.3 34.0 - 46.6 %   MCV 100 (H) 79 - 97 fL   MCH 33.9 (H) 26.6 - 33.0 pg   MCHC 33.8 31 - 35 g/dL   RDW 11.7 11.7 - 15.4 %   Platelets CANCELED x10E3/uL   Neutrophils 54 Not Estab. %   Lymphs 32 Not Estab. %   Monocytes 8 Not Estab. %   Eos 5 Not Estab. %   Basos 1 Not Estab. %   Neutrophils Absolute 3.8 1 - 7 x10E3/uL   Lymphocytes Absolute 2.3 0 - 3 x10E3/uL   Monocytes Absolute 0.6 0 - 0 x10E3/uL   EOS (ABSOLUTE) 0.3 0.0 - 0.4 x10E3/uL   Basophils Absolute 0.1 0 - 0 x10E3/uL   Immature Granulocytes 0 Not Estab. %   Immature Grans (Abs) 0.0 0.0 - 0.1 x10E3/uL   Hematology Comments: Note:   Comprehensive metabolic panel  Result Value Ref Range   Glucose 92 65 - 99 mg/dL   BUN 8 6 - 24 mg/dL   Creatinine, Ser 0.56 (L) 0.57 - 1.00 mg/dL   GFR calc non Af Amer 110 >59 mL/min/1.73   GFR calc Af Amer 127 >59 mL/min/1.73   BUN/Creatinine Ratio 14 9 - 23   Sodium 141 134 - 144 mmol/L    Potassium 4.3 3.5 - 5.2 mmol/L   Chloride 102 96 - 106 mmol/L   CO2 26 20 - 29 mmol/L   Calcium 9.5 8.7 - 10.2 mg/dL   Total Protein 6.7 6.0 - 8.5 g/dL   Albumin 4.5 3.8 - 4.8 g/dL   Globulin, Total 2.2 1.5 - 4.5 g/dL   Albumin/Globulin Ratio 2.0 1.2 - 2.2   Bilirubin Total <0.2 0.0 - 1.2 mg/dL   Alkaline Phosphatase 95 48 - 121 IU/L   AST 8 0 - 40 IU/L   ALT 5 0 - 32 IU/L  UA/M w/rflx Culture, Routine   Specimen: Urine   Urine  Result Value Ref Range   Specific Gravity, UA 1.015 1.005 - 1.030   pH, UA 6.5 5.0 - 7.5   Color, UA Yellow Yellow   Appearance Ur Clear Clear   Leukocytes,UA 2+ (A) Negative   Protein,UA Negative Negative/Trace   Glucose, UA Negative Negative   Ketones, UA Negative Negative   RBC, UA 1+ (A) Negative   Bilirubin, UA Negative Negative   Urobilinogen, Ur 0.2 0.2 - 1.0 mg/dL   Nitrite, UA Negative Negative   Microscopic Examination See below:    Urinalysis Reflex Comment   Lipase  Result Value Ref Range   Lipase 15 14 - 72 U/L      Assessment & Plan:   Problem List Items Addressed This Visit      Other   Obesity    States saxenda was denied by insurance, will try sending victoza and monitor for benefit. Diet and exercise reviewed      Relevant Medications   liraglutide (VICTOZA) 18 MG/3ML SOPN    Other Visit Diagnoses    RUQ pain    -  Primary   Zofran prn, push fluids, bland diet. Await u/s RUQ for further evaluation. Basic labs ordered. Strict ER precautions reviewed at length   Relevant Orders   CBC with Differential/Platelet (Completed)   Comprehensive metabolic panel (Completed)   UA/M w/rflx Culture, Routine (Completed)   Lipase (Completed)   US Abdomen Limited RUQ   Nausea  Zofran prn, push fluids, bland diet. Await u/s RUQ for further evaluation. Basic labs ordered   Relevant Medications   ondansetron (ZOFRAN-ODT) 4 MG disintegrating tablet      25 minutes spent today in direct care and counseling with  patient   Follow up plan: Return in about 4 weeks (around 02/16/2020) for weight check.

## 2020-01-20 ENCOUNTER — Telehealth: Payer: Self-pay

## 2020-01-20 LAB — CBC WITH DIFFERENTIAL/PLATELET
Basophils Absolute: 0.1 10*3/uL (ref 0.0–0.2)
Basos: 1 %
EOS (ABSOLUTE): 0.3 10*3/uL (ref 0.0–0.4)
Eos: 5 %
Hematocrit: 42.3 % (ref 34.0–46.6)
Hemoglobin: 14.3 g/dL (ref 11.1–15.9)
Immature Grans (Abs): 0 10*3/uL (ref 0.0–0.1)
Immature Granulocytes: 0 %
Lymphocytes Absolute: 2.3 10*3/uL (ref 0.7–3.1)
Lymphs: 32 %
MCH: 33.9 pg — ABNORMAL HIGH (ref 26.6–33.0)
MCHC: 33.8 g/dL (ref 31.5–35.7)
MCV: 100 fL — ABNORMAL HIGH (ref 79–97)
Monocytes Absolute: 0.6 10*3/uL (ref 0.1–0.9)
Monocytes: 8 %
Neutrophils Absolute: 3.8 10*3/uL (ref 1.4–7.0)
Neutrophils: 54 %
RBC: 4.22 x10E6/uL (ref 3.77–5.28)
RDW: 11.7 % (ref 11.7–15.4)
WBC: 7.1 10*3/uL (ref 3.4–10.8)

## 2020-01-20 LAB — COMPREHENSIVE METABOLIC PANEL
ALT: 5 IU/L (ref 0–32)
AST: 8 IU/L (ref 0–40)
Albumin/Globulin Ratio: 2 (ref 1.2–2.2)
Albumin: 4.5 g/dL (ref 3.8–4.8)
Alkaline Phosphatase: 95 IU/L (ref 48–121)
BUN/Creatinine Ratio: 14 (ref 9–23)
BUN: 8 mg/dL (ref 6–24)
Bilirubin Total: <0.2 mg/dL (ref 0.0–1.2)
CO2: 26 mmol/L (ref 20–29)
Calcium: 9.5 mg/dL (ref 8.7–10.2)
Chloride: 102 mmol/L (ref 96–106)
Creatinine, Ser: 0.56 mg/dL — ABNORMAL LOW (ref 0.57–1.00)
GFR calc Af Amer: 127 mL/min/{1.73_m2} (ref >59)
GFR calc non Af Amer: 110 mL/min/{1.73_m2} (ref >59)
Globulin, Total: 2.2 g/dL (ref 1.5–4.5)
Glucose: 92 mg/dL (ref 65–99)
Potassium: 4.3 mmol/L (ref 3.5–5.2)
Sodium: 141 mmol/L (ref 134–144)
Total Protein: 6.7 g/dL (ref 6.0–8.5)

## 2020-01-20 LAB — LIPASE: Lipase: 15 U/L (ref 14–72)

## 2020-01-20 NOTE — Telephone Encounter (Signed)
Prior Authorization initiated via CoverMyMeds for [employer]  Key: [id number]

## 2020-01-20 NOTE — Telephone Encounter (Signed)
Called and left a VM with Dr. Ethel Rana office to follow up.

## 2020-01-20 NOTE — Telephone Encounter (Signed)
PA approved.

## 2020-01-22 LAB — UA/M W/RFLX CULTURE, ROUTINE
Bilirubin, UA: NEGATIVE
Glucose, UA: NEGATIVE
Ketones, UA: NEGATIVE
Nitrite, UA: NEGATIVE
Protein,UA: NEGATIVE
Specific Gravity, UA: 1.015 (ref 1.005–1.030)
Urobilinogen, Ur: 0.2 mg/dL (ref 0.2–1.0)
pH, UA: 6.5 (ref 5.0–7.5)

## 2020-01-22 LAB — URINE CULTURE, REFLEX

## 2020-01-22 LAB — MICROSCOPIC EXAMINATION: Bacteria, UA: NONE SEEN

## 2020-01-23 NOTE — Telephone Encounter (Signed)
Called and spoke to [place]. He is faxing over the patient's last note that clarifies what they are requesting from [place]. Will await fax.

## 2020-01-26 ENCOUNTER — Ambulatory Visit
Admission: RE | Admit: 2020-01-26 | Discharge: 2020-01-26 | Disposition: A | Discharge status: Home / Self Care | Payer: Medicare Other | Source: Ambulatory Visit | Attending: Family Medicine | Admitting: Family Medicine

## 2020-01-26 ENCOUNTER — Other Ambulatory Visit: Payer: Self-pay

## 2020-01-26 DIAGNOSIS — R1011 Right upper quadrant pain: Secondary | ICD-10-CM | POA: Diagnosis not present

## 2020-01-26 NOTE — Assessment & Plan Note (Signed)
States saxenda was denied by insurance, will try sending victoza and monitor for benefit. Diet and exercise reviewed

## 2020-01-27 ENCOUNTER — Encounter: Payer: Self-pay | Admitting: Family Medicine

## 2020-02-08 ENCOUNTER — Emergency Department
Admission: EM | Admit: 2020-02-08 | Discharge: 2020-02-08 | Disposition: A | Discharge status: Home / Self Care | Payer: Medicare Other | Attending: Emergency Medicine | Admitting: Emergency Medicine

## 2020-02-08 ENCOUNTER — Other Ambulatory Visit: Payer: Self-pay

## 2020-02-08 ENCOUNTER — Encounter: Payer: Self-pay | Admitting: *Deleted

## 2020-02-08 DIAGNOSIS — J449 Chronic obstructive pulmonary disease, unspecified: Secondary | ICD-10-CM | POA: Insufficient documentation

## 2020-02-08 DIAGNOSIS — Z7951 Long term (current) use of inhaled steroids: Secondary | ICD-10-CM | POA: Diagnosis not present

## 2020-02-08 DIAGNOSIS — F17210 Nicotine dependence, cigarettes, uncomplicated: Secondary | ICD-10-CM | POA: Diagnosis not present

## 2020-02-08 DIAGNOSIS — Z20822 Contact with and (suspected) exposure to covid-19: Secondary | ICD-10-CM | POA: Diagnosis not present

## 2020-02-08 DIAGNOSIS — J45909 Unspecified asthma, uncomplicated: Secondary | ICD-10-CM | POA: Insufficient documentation

## 2020-02-08 DIAGNOSIS — Z794 Long term (current) use of insulin: Secondary | ICD-10-CM | POA: Diagnosis not present

## 2020-02-08 DIAGNOSIS — U071 COVID-19: Secondary | ICD-10-CM | POA: Insufficient documentation

## 2020-02-08 DIAGNOSIS — R519 Headache, unspecified: Secondary | ICD-10-CM | POA: Diagnosis present

## 2020-02-08 LAB — URINALYSIS, COMPLETE (UACMP) WITH MICROSCOPIC
Bacteria, UA: NONE SEEN
Bilirubin Urine: NEGATIVE
Glucose, UA: NEGATIVE mg/dL
Ketones, ur: NEGATIVE mg/dL
Nitrite: NEGATIVE
Protein, ur: NEGATIVE mg/dL
Specific Gravity, Urine: 1.004 — ABNORMAL LOW (ref 1.005–1.030)
pH: 8 (ref 5.0–8.0)

## 2020-02-08 LAB — SARS CORONAVIRUS 2 BY RT PCR (HOSPITAL ORDER, PERFORMED IN ~~LOC~~ HOSPITAL LAB): SARS Coronavirus 2: POSITIVE — AB

## 2020-02-08 MED ORDER — ONDANSETRON 4 MG PO TBDP: 4.0000 mg | ORAL_TABLET | Freq: Three times a day (TID) | ORAL | 0 refills | Status: AC | PRN

## 2020-02-08 MED ORDER — ALBUTEROL SULFATE HFA 108 (90 BASE) MCG/ACT IN AERS: 2.0000 | INHALATION_SPRAY | Freq: Four times a day (QID) | RESPIRATORY_TRACT | 2 refills | Status: DC | PRN

## 2020-02-08 MED ORDER — KETOROLAC TROMETHAMINE 30 MG/ML IJ SOLN
30.0000 mg | Freq: Once | INTRAMUSCULAR | Status: AC
  Administered 2020-02-08: 30 mg via INTRAMUSCULAR
  Filled 2020-02-08: qty 1

## 2020-02-08 MED ORDER — BENZONATATE 100 MG PO CAPS: 100.0000 mg | ORAL_CAPSULE | Freq: Three times a day (TID) | ORAL | 0 refills | Status: AC | PRN

## 2020-02-08 NOTE — ED Provider Notes (Signed)
Emergency Department Provider Note  ____________________________________________  Time seen: Approximately 8:39 PM  I have reviewed the triage vital signs and the nursing notes.   HISTORY  Chief Complaint Headache and Fever   Historian Patient    HPI Janet Murray is a 49 y.o. female presents to the emergency department with low back pain, headache and chills.  Patient denies dysuria, hematuria or increased urinary frequency.  She states that whenever she is starting to feel sick, she typically experiences spasms in her low back.  She denies chest pain, chest tightness and abdominal pain.  No diarrhea.  No recent travel.  No known contacts with COVID-19.   Past Medical History:  Diagnosis Date  . Abdominal mass, RUQ (right upper quadrant)   . Anxiety   . Arthritis   . Asthma   . Bronchitis   . Carpal tunnel syndrome of left wrist 10/25/2014  . Chronic bronchitis, obstructive ([place]) 07/20/2017  . Chronic pain   . COPD (chronic obstructive pulmonary disease) ([place])   . Cyst of right kidney 03/22/2018  . DDD (degenerative disc disease), lumbar 10/25/2014  . Degenerative joint disease (DJD) of hip   . Diarrhea of infectious origin 03/25/2018  . Dysphagia   . Emphysema of lung ([place])   . Facet syndrome, lumbar 10/25/2014  . Fatty liver 03/25/2018  . Foot fracture, left 05/02/15  . 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 GERD (gastroesophageal reflux disease)   . Hereditary and idiopathic peripheral neuropathy 11/06/2014  . IBS (irritable bowel syndrome)   . Low back pain   . Migraine 02/04/2016  . Occipital neuralgia   . 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 Other intervertebral disc degeneration, lumbar region   . Other irritable bowel syndrome 04/20/2017  . Other spondylosis with radiculopathy, lumbar region 10/25/2014  . RLS (restless legs syndrome)   . Sacroiliac joint dysfunction of both sides 10/25/2014  . Spondylosis of lumbar spine   .  Urinary incontinence      Immunizations up to date:  Yes.     Past Medical History:  Diagnosis Date  . Abdominal mass, RUQ (right upper quadrant)   . Anxiety   . Arthritis   . Asthma   . Bronchitis   . Carpal tunnel syndrome of left wrist 10/25/2014  . Chronic bronchitis, obstructive ([place]) 07/20/2017  . Chronic pain   . COPD (chronic obstructive pulmonary disease) ([place])   . Cyst of right kidney 03/22/2018  . DDD (degenerative disc disease), lumbar 10/25/2014  . Degenerative joint disease (DJD) of hip   . Diarrhea of infectious origin 03/25/2018  . Dysphagia   . Emphysema of lung ([place])   . Facet syndrome, lumbar 10/25/2014  . Fatty liver 03/25/2018  . Foot fracture, left 05/02/15  . 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 GERD (gastroesophageal reflux disease)   . Hereditary and idiopathic peripheral neuropathy 11/06/2014  . IBS (irritable bowel syndrome)   . Low back pain   . Migraine 02/04/2016  . Occipital neuralgia   . 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 Other intervertebral disc degeneration, lumbar region   . Other irritable bowel syndrome 04/20/2017  . Other spondylosis with radiculopathy, lumbar region 10/25/2014  . RLS (restless legs syndrome)   . Sacroiliac joint dysfunction of both sides 10/25/2014  . Spondylosis of lumbar spine   . Urinary incontinence     Patient Active Problem List   Diagnosis Date Noted  . COPD (chronic obstructive pulmonary disease) with emphysema (  [place]) 12/13/2019  . Tobacco abuse 12/13/2019  . Acquired hypothyroidism 11/01/2019  . Panic attack 11/01/2019  . Urinary incontinence   . Obesity 10/26/2019  . Diarrhea of infectious origin 03/25/2018  . Fatty liver 03/25/2018  . History of alcohol use 03/25/2018  . Cyst of right kidney 03/22/2018  . Chronic nausea 02/19/2018  . 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 Abdominal pain, RUQ (right upper quadrant) 10/10/2017  . Chronic bronchitis, obstructive ([place]) 07/20/2017   . Other dysphagia 04/20/2017  . Other irritable bowel syndrome 04/20/2017  . Migraine 02/04/2016  . Lumbar radiculopathy 11/08/2014  . Hereditary and idiopathic peripheral neuropathy 11/06/2014  . 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 Facet syndrome, lumbar 10/25/2014  . Sacroiliac joint dysfunction of both sides 10/25/2014  . Carpal tunnel syndrome of left wrist 10/25/2014  . Occipital neuralgia 10/25/2014  . Restless leg syndrome 10/25/2014  . DDD (degenerative disc disease), lumbar 10/25/2014  . Other spondylosis with radiculopathy, lumbar region 10/25/2014    Past Surgical History:  Procedure Laterality Date  . ABDOMINAL HYSTERECTOMY    . APPENDECTOMY    . BLADDER SURGERY     sling - Dr. Davis Gourd  . CESAREAN SECTION    . ESOPHAGOGASTRODUODENOSCOPY  04/26/2019  . STOMACH SURGERY      Prior to Admission medications   Medication Sig Start Date End Date Taking? Authorizing Provider  albuterol (VENTOLIN HFA) 108 (90 Base) MCG/ACT inhaler Inhale 2 puffs into the lungs every 6 (six) hours as needed for wheezing or shortness of breath. 02/08/20   Lannie Fields, PA-C  AMBULATORY NON FORMULARY MEDICATION Medication Name: nebulizer DX: J44.9 12/07/17   Laverle Hobby, MD  benzonatate (TESSALON PERLES) 100 MG capsule Take 1 capsule (100 mg total) by mouth 3 (three) times daily as needed for up to 7 days for cough. 02/08/20 02/15/20  Lannie Fields, PA-C  esomeprazole (NEXIUM) 40 MG capsule Take 1 capsule (40 mg total) by mouth 2 (two) times daily. 10/26/19   Volney American, PA-C  Glycopyrrolate-Formoterol (BEVESPI AEROSPHERE) 9-4.8 MCG/ACT AERO Inhale 2 puffs into the lungs 2 (two) times daily. 12/13/19   Parrett, Fonnie Mu, NP  hydrOXYzine (ATARAX/VISTARIL) 25 MG tablet Take 1 tablet (25 mg total) by mouth 3 (three) times daily as needed. 10/26/19   Volney American, PA-C  Insulin Pen Needle (PEN NEEDLES) 32G X 4 MM MISC 1 Units by Does not  apply route daily. Patient not taking: Reported on 01/19/2020 10/31/19   Volney American, PA-C  ipratropium-albuterol (DUONEB) 0.5-2.5 (3) MG/3ML SOLN USE 1 VIAL (3ML TOTAL) BY NEBULIZATION EVERY 6 HOURS AS NEEDED FOR SHORTNESS OFBREATH AND WHEEZING. 12/13/19   Parrett, Fonnie Mu, NP  levothyroxine (SYNTHROID) 75 MCG tablet Take 1 tablet (75 mcg total) by mouth daily. 10/28/19   Volney American, PA-C  liraglutide (VICTOZA) 18 MG/3ML SOPN Inject 0.3 mLs (1.8 mg total) into the skin daily. 01/19/20   Volney American, PA-C  Liraglutide -Weight Management (SAXENDA) 18 MG/3ML SOPN Inject 0.1 mLs (0.6 mg total) into the skin daily for 7 days, THEN 0.2 mLs (1.2 mg total) daily for 7 days, THEN 0.3 mLs (1.8 mg total) daily for 7 days, THEN 0.4 mLs (2.4 mg total) daily for 7 days, THEN 0.5 mLs (3 mg total) daily. Patient not taking: Reported on 12/13/2019 10/31/19 02/26/20  Volney American, PA-C  montelukast (SINGULAIR) 10 MG tablet Take 1 tablet (10 mg total) by mouth at bedtime. 10/26/19   [name]  Elizabeth, PA-C  ondansetron (ZOFRAN ODT) 4 MG disintegrating tablet Take 1 tablet (4 mg total) by mouth every 8 (eight) hours as needed for up to 5 days. 02/08/20 02/13/20  Lannie Fields, PA-C  oxyCODONE (OXY IR/ROXICODONE) 5 MG immediate release tablet Take by mouth. Taking 2-3 times daily as needed 06/29/18   [provider]  pramipexole (MIRAPEX) 0.5 MG tablet Take 1 tablet (0.5 mg total) by mouth 3 (three) times daily. 10/28/19   Volney American, PA-C  tiZANidine (ZANAFLEX) 2 MG tablet Take by mouth 3 (three) times daily.  07/27/18   [provider]  VESICARE 5 MG tablet Take 1 tablet (5 mg total) by mouth daily. 10/26/19   Volney American, PA-C    Allergies Penicillins, Acetaminophen, Ciprofloxacin, Aspirin, Hydrocodone, Other, and Tape  Family History  Problem Relation Age of Onset  . Neuropathy Mother   . Arthritis Father   . Hypertension Father   .  Leukemia Father   . Heart disease Father        CAD s/p CABG  . Healthy Brother   . Healthy Daughter   . Healthy Son   . Dementia Maternal Grandmother   . Hypertension Maternal Grandmother   . Hypercholesterolemia Maternal Grandmother   . Heart disease Maternal Grandmother   . Healthy Brother   . Healthy Brother   . Stroke Neg Hx   . Cancer Neg Hx     Social History Social History   Tobacco Use  . Smoking status: Current Every Day Smoker    Packs/day: 0.50    Years: 32.00    Pack years: 16.00    Types: Cigarettes  . Smokeless tobacco: Never Used  . Tobacco comment: Started smoking at age 20  Vaping Use  . Vaping Use: Never used  Substance Use Topics  . Alcohol use: Not Currently    Alcohol/week: 0.0 standard drinks    Comment: occassionally  . Drug use: No     Review of Systems  Constitutional: Patient has chills.  Eyes:  No discharge ENT: No upper respiratory complaints. Respiratory: no cough. No SOB/ use of accessory muscles to breath Gastrointestinal:   No nausea, no vomiting.  No diarrhea.  No constipation. Musculoskeletal: Patient has back pain.  Skin: Negative for rash, abrasions, lacerations, ecchymosis.    ____________________________________________   PHYSICAL EXAM:  VITAL SIGNS: ED Triage Vitals  Enc Vitals Group     BP 02/08/20 1801 137/71     Pulse Rate 02/08/20 1801 83     Resp 02/08/20 1801 20     Temp 02/08/20 1801 99.8 F (37.7 C)     Temp Source 02/08/20 1801 Oral     SpO2 02/08/20 1801 96 %     Weight 02/08/20 1759 180 lb (81.6 kg)     Height 02/08/20 1759 5\' 1"  (1.549 m)     Head Circumference --      Peak Flow --      Pain Score 02/08/20 1758 10     Pain Loc --      Pain Edu? --      Excl. in [place]? --      Constitutional: Alert and oriented. Well appearing and in no acute distress. Eyes: Conjunctivae are normal. PERRL. EOMI. Head: Atraumatic. ENT:      Ears: TMs are pearly.       Nose: No congestion/rhinnorhea.       Mouth/Throat: Mucous membranes are moist.  Neck: No stridor.  FROM.  Cardiovascular: Normal rate, regular rhythm. Normal S1 and S2.  Good peripheral circulation. Respiratory: Normal respiratory effort without tachypnea or retractions. Lungs CTAB. Good air entry to the bases with no decreased or absent breath sounds Gastrointestinal: Bowel sounds x 4 quadrants. Soft and nontender to palpation. No guarding or rigidity. No distention. Musculoskeletal: Full range of motion to all extremities. No obvious deformities noted Neurologic:  Normal for age. No gross focal neurologic deficits are appreciated.  Skin:  Skin is warm, dry and intact. No rash noted. Psychiatric: Mood and affect are normal for age. Speech and behavior are normal.   ____________________________________________   LABS (all labs ordered are listed, but only abnormal results are displayed)  Labs Reviewed  SARS CORONAVIRUS 2 BY RT PCR (HOSPITAL ORDER, [facility]) - Abnormal; Notable for the following components:      Result Value   SARS Coronavirus 2 POSITIVE (*)    All other components within normal limits  URINALYSIS, COMPLETE (UACMP) WITH MICROSCOPIC - Abnormal; Notable for the following components:   Color, Urine STRAW (*)    APPearance CLEAR (*)    Specific Gravity, Urine 1.004 (*)    Hgb urine dipstick MODERATE (*)    Leukocytes,Ua TRACE (*)    All other components within normal limits   ____________________________________________  EKG   ____________________________________________  RADIOLOGY   No results found.  ____________________________________________    PROCEDURES  Procedure(s) performed:     Procedures     Medications  ketorolac (TORADOL) 30 MG/ML injection 30 mg (30 mg Intramuscular Given 02/08/20 2101)     ____________________________________________   INITIAL IMPRESSION / ASSESSMENT AND PLAN / ED COURSE  Pertinent labs & imaging results that were  available during my care of the patient were reviewed by me and considered in my medical decision making (see chart for details).    Assessment and Plan:  Covid [age] year old female presents to the emergency department with low-grade fever, body aches, headache and low back pain.  Patient tested positive for COVID-19.  Rest and hydration were encouraged at home.  Patient was discharged with Tessalon Perles, Zofran and albuterol.  Return precautions were given to return with new or worsening symptoms. ____________________________________________  FINAL CLINICAL IMPRESSION(S) / ED DIAGNOSES  Final diagnoses:  COVID-19      NEW MEDICATIONS STARTED DURING THIS VISIT:  ED Discharge Orders         Ordered    ondansetron (ZOFRAN ODT) 4 MG disintegrating tablet  Every 8 hours PRN        02/08/20 2139    albuterol (VENTOLIN HFA) 108 (90 Base) MCG/ACT inhaler  Every 6 hours PRN        02/08/20 2139    benzonatate (TESSALON PERLES) 100 MG capsule  3 times daily PRN        02/08/20 2139              This chart was dictated using voice recognition software/Dragon. Despite best efforts to proofread, errors can occur which can change the meaning. Any change was purely unintentional.     Lannie Fields, PA-C 02/08/20 2222    Nena Polio, MD 02/08/20 2234

## 2020-02-08 NOTE — ED Notes (Signed)
Pt with a slow gait to room.  Pt has back pain all over her back.  No known injury.  Denies urinary sx.   Pt states she woke up with a headache chills bodyaches this morning.  Pt alert.

## 2020-02-08 NOTE — ED Triage Notes (Signed)
Pt reports h/a, bodyaches, chills, fever and back ache for 1 day.  cig smoker  Hx copd.  Pt alert

## 2020-02-09 ENCOUNTER — Telehealth: Payer: Self-pay | Admitting: Adult Health

## 2020-02-09 NOTE — Telephone Encounter (Signed)
Called and LMOM regarding monoclonal antibody treatment for COVID 19 given to those who are at risk for complications and/or hospitalization of the virus.  Patient meets criteria based on: BMI greater than 25  Call back number given: [phone]  My chart message: sent  Berkley Cronkright, NP  

## 2020-02-11 ENCOUNTER — Other Ambulatory Visit: Payer: Self-pay | Admitting: Family

## 2020-02-11 ENCOUNTER — Telehealth: Payer: Self-pay | Admitting: Family

## 2020-02-11 DIAGNOSIS — U071 COVID-19: Secondary | ICD-10-CM

## 2020-02-11 DIAGNOSIS — E1169 Type 2 diabetes mellitus with other specified complication: Secondary | ICD-10-CM

## 2020-02-11 DIAGNOSIS — E6609 Other obesity due to excess calories: Secondary | ICD-10-CM

## 2020-02-11 DIAGNOSIS — J439 Emphysema, unspecified: Secondary | ICD-10-CM

## 2020-02-11 NOTE — Telephone Encounter (Signed)
Called to Discuss with patient about Covid symptoms and the use of the monoclonal antibody infusion for those with mild to moderate Covid symptoms and at a high risk of hospitalization.     Pt appears to qualify for this infusion due to co-morbid conditions and/or a member of an at-risk group in accordance with the FDA Emergency Use Authorization.   Janet Murray was recently seen in the ED at [facility] with positive COVID 19 test. She was experiencing back pain, headache and chills. Risk factors include BMI >25, chronic lung disease, and diabetes. Symptoms start on 9/1 and continues to experience cough, congestion and body aches. Discussed the risks and benefits of treatment with Regeneron and she wishes to proceed.  Hello Janet Murray,   You have been scheduled to receive Regeneron (the monoclonal antibody we discussed) on : 02/12/20 at 3pm   If you have been tested outside of a [facility] - you MUST bring a copy of your positive test with you the morning of your appointment. You may take a photo of this and upload to your MyChart portal or have the testing facility fax the result to [phone]    The address for the infusion clinic site is:  --GPS address is [place] - the parking is located near [employer] building where you will see  COVID19 Infusion feather banner marking the entrance to parking.   (see photos below)            --Enter into the 2nd entrance where the "wave, flag banner" is at the road. Turn into this 2nd entrance and immediately turn left to park in 1 of the 5 parking spots.   --Please stay in your car and call the desk for assistance inside [phone].   --Average time in department is roughly 2 hours for Regeneron treatment - this includes preparation of the medication, IV start and the required 1 hour monitoring after the infusion.    Should you develop worsening shortness of breath, chest pain or severe breathing problems please do not wait for  this appointment and go to the Emergency room for evaluation and treatment. You will undergo another oxygen screen before your infusion to ensure this is the best treatment option for you. There is a chance that the best decision may be to send you to the Emergency Room for evaluation at the time of your appointment.   The day of your visit you should: Marland Kitchen Get plenty of rest the night before and drink plenty of water . Eat a light meal/snack before coming and take your medications as prescribed  . Wear warm, comfortable clothes with a shirt that can roll-up over the elbow (will need IV start).  . Wear a mask  . Consider bringing some activity to help pass the time  Many commercial insurers are waiving bills related to [place] treatment however some have ranged from $[phone]. We are starting to see some insurers send bills to patients later for the administration of the medication - we are learning more information but you may receive a bill after your appointment.  Please contact your insurance agent to discuss prior to your appointment if you would like further details about billing specific to your policy.      Mauricio Po, NP 02/11/2020 11:09 AM

## 2020-02-11 NOTE — Progress Notes (Signed)
I connected by phone with Janet Murray on 02/11/2020 at 11:12 AM to discuss the potential use of a new treatment for mild to moderate COVID-19 viral infection in non-hospitalized patients.  This patient is a 49 y.o. female that meets the FDA criteria for Emergency Use Authorization of COVID monoclonal antibody casirivimab/imdevimab.  Has a (+) direct SARS-CoV-2 viral test result  Has mild or moderate COVID-19   Is NOT hospitalized due to COVID-19  Is within 10 days of symptom onset  Has at least one of the high risk factor(s) for progression to severe COVID-19 and/or hospitalization as defined in EUA.  Specific high risk criteria : Diabetes, Cardiovascular disease or hypertension and Chronic Lung Disease   I have spoken and communicated the following to the patient or parent/caregiver regarding COVID monoclonal antibody treatment:  1. FDA has authorized the emergency use for the treatment of mild to moderate COVID-19 in adults and pediatric patients with positive results of direct SARS-CoV-2 viral testing who are 7 years of age and older weighing at least 40 kg, and who are at high risk for progressing to severe COVID-19 and/or hospitalization.  2. The significant known and potential risks and benefits of COVID monoclonal antibody, and the extent to which such potential risks and benefits are unknown.  3. Information on available alternative treatments and the risks and benefits of those alternatives, including clinical trials.  4. Patients treated with COVID monoclonal antibody should continue to self-isolate and use infection control measures (e.g., wear mask, isolate, social distance, avoid sharing personal items, clean and disinfect "high touch" surfaces, and frequent handwashing) according to CDC guidelines.   5. The patient or parent/caregiver has the option to accept or refuse COVID monoclonal antibody treatment.  After reviewing this information with the patient, The patient  agreed to proceed with receiving casirivimab\imdevimab infusion and will be provided a copy of the Fact sheet prior to receiving the infusion.   Mauricio Po, NP 02/11/2020 11:12 AM

## 2020-02-12 ENCOUNTER — Ambulatory Visit (HOSPITAL_COMMUNITY): Payer: Medicare Other

## 2020-02-14 ENCOUNTER — Emergency Department
Admission: EM | Admit: 2020-02-14 | Discharge: 2020-02-14 | Discharge status: Against Medical Advice | Payer: Medicare Other

## 2020-02-14 NOTE — ED Triage Notes (Signed)
Pt to triage via w/c with no distress noted; pt reports N/V and rt sided abd pain today

## 2020-02-15 ENCOUNTER — Ambulatory Visit: Payer: Self-pay

## 2020-02-15 NOTE — Telephone Encounter (Signed)
Pt. Reports she was diagnosed with COVID 19 02/08/20. Today is having pain to right lower back. No fever, no cough. Has fatigue and body aches. No availability today. Virtual visit made for tomorrow afternoon. If pt. Can be seen sooner, please advise.  Reason for Disposition . [1] Fever > 100.0 F (37.8 C) AND [2] bedridden (e.g., nursing home patient, CVA, chronic illness, recovering from surgery)  Answer Assessment - Initial Assessment Questions 1. COVID-19 DIAGNOSIS: "Who made your Coronavirus (COVID-19) diagnosis?" "Was it confirmed by a positive lab test?" If not diagnosed by a HCP, ask "Are there lots of cases (community spread) where you live?" (See public health department website, if unsure)     Yes 2. COVID-19 EXPOSURE: "Was there any known exposure to [place] before the symptoms began?" CDC Definition of close contact: within 6 feet (2 meters) for a total of 15 minutes or more over a 24-hour period.      No 3. ONSET: "When did the COVID-19 symptoms start?"      Started last Wednesday 4. WORST SYMPTOM: "What is your worst symptom?" (e.g., cough, fever, shortness of breath, muscle aches)     Right back pain lower 5. COUGH: "Do you have a cough?" If Yes, ask: "How bad is the cough?"       No 6. FEVER: "Do you have a fever?" If Yes, ask: "What is your temperature, how was it measured, and when did it start?"     No 7. RESPIRATORY STATUS: "Describe your breathing?" (e.g., shortness of breath, wheezing, unable to speak)      No 8. BETTER-SAME-WORSE: "Are you getting better, staying the same or getting worse compared to yesterday?"  If getting worse, ask, "In what way?"     Same 9. HIGH RISK DISEASE: "Do you have any chronic medical problems?" (e.g., asthma, heart or lung disease, weak immune system, obesity, etc.)     COPD 10. PREGNANCY: "Is there any chance you are pregnant?" "When was your last menstrual period?"       No 11. OTHER SYMPTOMS: "Do you have any other symptoms?"  (e.g.,  chills, fatigue, headache, loss of smell or taste, muscle pain, sore throat; new loss of smell or taste especially support the diagnosis of COVID-19)       Body aches  Protocols used: CORONAVIRUS (COVID-19) DIAGNOSED OR SUSPECTED-A-AH

## 2020-02-15 NOTE — Telephone Encounter (Signed)
Noted  

## 2020-02-15 NOTE — Telephone Encounter (Signed)
FYI she is being seen by you tomorrow afternoon:)

## 2020-02-16 ENCOUNTER — Telehealth: Payer: Medicare Other | Admitting: Unknown Physician Specialty

## 2020-02-16 ENCOUNTER — Ambulatory Visit: Payer: Medicare Other | Admitting: Family Medicine

## 2020-02-28 ENCOUNTER — Ambulatory Visit: Payer: Self-pay | Admitting: Family Medicine

## 2020-02-28 NOTE — Telephone Encounter (Signed)
Recommend UC or ER visit since continued symptoms and SOB with minimal exertion, would benefit from face to face and further work-up.

## 2020-02-28 NOTE — Telephone Encounter (Signed)
Pt reports covid positive 02/08/20, symptoms presented that day. On 02/17/20 went to ED , dehydrated. Reports still with chest tightness, SOB with minimal exertion, "Sometimes just sitting." Also reports heart "Pounding" at times, no palpatations. Denies fever. States cough, clear phlegm "But that is usual for me." H/O COPD,emphysema.Marland Kitchen Speech non-halting during call. Some increased fatigue. Pt requesting "Order for CXR." States PCP has ordered CXR and blood work previously at [facility] "So I don't have to go to hospital." Assured NT would route to practice for PCPs review. Advised she may be advised ED. Care advise given, advised ED if symptoms worsen.    Please advise: CB# [phone] Reason for Disposition  [1] PERSISTING SYMPTOMS OF COVID-19 AND [2] NEW symptom AND [3] could be serious  Answer Assessment - Initial Assessment Questions 1. COVID-19 ONSET: "When did the symptoms of COVID-19 first start?"     02/08/20 2. DIAGNOSIS CONFIRMATION: "How were you diagnosed?" (e.g., COVID-19 oral or nasal viral test; COVID-19 antibody test; doctor visit)     Nasal 3. MAIN SYMPTOM:  "What is your main concern or symptom right now?" (e.g., breathing difficulty, cough, fatigue. loss of smell)     Chest tightness 4. SYMPTOM ONSET: "When did the  *No Answer*  start?"     02/08/20 5. BETTER-SAME-WORSE: "Are you getting better, staying the same, or getting worse over the last 1 to 2 weeks?"     Better but chest tightness remains 6. RECENT MEDICAL VISIT: "Have you been seen by a healthcare provider (doctor, NP, PA) for these persisting COVID-19 symptoms?" If Yes, ask: "When were you seen?" (e.g., date)     ED when tested. 02/17/20 dehydrated  7. COUGH: "Do you have a cough?" If Yes, ask: "How bad is the cough?"       Yes but normal for me. Clear. 8. FEVER: "Do you have a fever?" If Yes, ask: "What is your temperature, how was it and when did it start?"     no 9. BREATHING DIFFICULTY: "Are you having  any trouble breathing?" If Yes, ask: "How bad is your breathing?" (e.g., mild, moderate, severe)    - MILD: No SOB at rest, mild SOB with walking, speaks normally in sentences, can lay down, no retractions, pulse < 100.    - MODERATE: SOB at rest, SOB with minimal exertion and prefers to sit, cannot lie down flat, speaks in phrases, mild retractions, audible wheezing, pulse 100-120.    - SEVERE: Very SOB at rest, speaks in single words, struggling to breathe, sitting hunched forward, retractions, pulse > 120       SOB with minimal exertion, at times at rest. Heart bounding 10. HIGH RISK DISEASE: "Do you have any chronic medical problems?" (e.g., asthma, heart or lung disease, weak immune system, obesity, etc.)       COPD 12. OTHER SYMPTOMS: "Do you have any other symptoms?"  (e.g., fatigue, headache, muscle pain, weakness)       No appetite  Protocols used: CORONAVIRUS (COVID-19) PERSISTING SYMPTOMS FOLLOW-UP CALL-A-AH

## 2020-02-28 NOTE — Telephone Encounter (Signed)
Pt stated she would be unable to go today but would go to Urgent care tomorrow, Pt verbalized understanding.

## 2020-03-28 ENCOUNTER — Other Ambulatory Visit: Payer: Self-pay

## 2020-04-02 DIAGNOSIS — M7061 Trochanteric bursitis, right hip: Secondary | ICD-10-CM | POA: Insufficient documentation

## 2020-04-02 DIAGNOSIS — M533 Sacrococcygeal disorders, not elsewhere classified: Secondary | ICD-10-CM | POA: Insufficient documentation

## 2020-04-02 DIAGNOSIS — G8929 Other chronic pain: Secondary | ICD-10-CM | POA: Insufficient documentation

## 2020-04-03 ENCOUNTER — Other Ambulatory Visit: Payer: Self-pay | Admitting: Adult Health

## 2020-04-03 ENCOUNTER — Other Ambulatory Visit: Payer: Self-pay | Admitting: Family Medicine

## 2020-04-03 NOTE — Telephone Encounter (Signed)
Requested medication (s) are due for refill today - yes  Requested medication (s) are on the active medication list -yes  Future visit scheduled -no  Last refill: 01/13/20  Notes to clinic: No follow up lab - dose changed with last lab draw.Sent for review   Requested Prescriptions  Pending Prescriptions Disp Refills   levothyroxine (SYNTHROID) 75 MCG tablet [Pharmacy Med Name: LEVOTHYROXINE SODIUM 75 MCG TAB] 30 tablet 1    Sig: TAKE 1 TABLET BY MOUTH DAILY      Endocrinology:  Hypothyroid Agents Failed - 04/03/2020  4:04 PM      Failed - TSH needs to be rechecked within 3 months after an abnormal result. Refill until TSH is due.      Failed - TSH in normal range and within 360 days    TSH  Date Value Ref Range Status  10/26/2019 17.000 (H) 0.450 - 4.500 uIU/mL Final          Passed - Valid encounter within last 12 months    Recent Outpatient Visits           2 months ago RUQ pain   [place], [place], [place]   5 months ago Chronic bronchitis, obstructive [facility])   [facility] Volney American, [place]   1 year ago Acute non-recurrent pansinusitis   [facility] Mikey College, NP   1 year ago Dermatitis   [facility] Merrilyn Puma, Jerrel Ivory, NP   1 year ago COPD with acute exacerbation [facility])   [place], DO                  Requested Prescriptions  Pending Prescriptions Disp Refills   levothyroxine (SYNTHROID) 75 MCG tablet [Pharmacy Med Name: LEVOTHYROXINE SODIUM 75 MCG TAB] 30 tablet 1    Sig: TAKE 1 TABLET BY MOUTH DAILY      Endocrinology:  Hypothyroid Agents Failed - 04/03/2020  4:04 PM      Failed - TSH needs to be rechecked within 3 months after an abnormal result. Refill until TSH is due.      Failed - TSH in normal range and within 360 days    TSH  Date Value Ref Range Status  10/26/2019 17.000 (H) 0.450 - 4.500 uIU/mL Final           Passed - Valid encounter within last 12 months    Recent Outpatient Visits           2 months ago RUQ pain   [place], [place]   5 months ago Chronic bronchitis, obstructive [facility])   [facility] Volney American, [place]   1 year ago Acute non-recurrent pansinusitis   [facility] Mikey College, NP   1 year ago Dermatitis   [facility] Mikey College, NP   1 year ago COPD with acute exacerbation [facility])   [place], DO

## 2020-04-04 NOTE — Telephone Encounter (Signed)
Lvm to make this apt. 

## 2020-04-04 NOTE — Telephone Encounter (Signed)
Previous RL patient. NP appointment in May 2021, had acute visit in August 2021.

## 2020-04-05 ENCOUNTER — Other Ambulatory Visit: Payer: Self-pay

## 2020-04-05 ENCOUNTER — Ambulatory Visit (INDEPENDENT_AMBULATORY_CARE_PROVIDER_SITE_OTHER): Payer: Medicare Other | Admitting: Gastroenterology

## 2020-04-05 ENCOUNTER — Encounter: Payer: Self-pay | Admitting: Gastroenterology

## 2020-04-05 VITALS — BP 131/78 | HR 69 | Temp 98.3°F | Ht 61.0 in | Wt 185.0 lb

## 2020-04-05 DIAGNOSIS — R1011 Right upper quadrant pain: Secondary | ICD-10-CM

## 2020-04-05 DIAGNOSIS — Z1211 Encounter for screening for malignant neoplasm of colon: Secondary | ICD-10-CM

## 2020-04-05 MED ORDER — NA SULFATE-K SULFATE-MG SULF 17.5-3.13-1.6 GM/177ML PO SOLN: ORAL | 0 refills | Status: DC

## 2020-04-06 NOTE — Progress Notes (Signed)
Janet Murray [address]  [place], [place] [address]  Main: [phone]  Fax: [phone]   Gastroenterology Consultation  Referring Provider:     Volney American,* Primary Care Physician:  Nyra Capes Reason for Consultation:     Abdominal pain        HPI:    Chief Complaint  Patient presents with  . Irritable Bowel Syndrome    Janet Murray is a 49 y.o. y/o female referred for consultation & management  by Dr. Orene Desanctis, Lilia Argue, PA-C.  Patient reports right upper quadrant abdominal pain for over 6 months.  Unrelated to meals.  Improves on stretching the area.  Patient was previously seen by [facility] clinic GI and they have documented same symptoms in her note and work-up previously.  No nausea or vomiting, no weight loss.  Patient is on Nexium twice daily for years for heartburn and states she cannot do without it if symptoms recur when they have tried to decrease the medication previously.  No dysphagia.    Patient's last colonoscopy appears to be in 2009, in prov[place] and she has not had another one since then.  Her last upper endoscopy was in November 2020, done at [facility] (she was referred to them by [facility] clinic GI), indication was "surveillance of gastric nodule".  This showed normal esophagus, 25 mm post mucosectomy scar in the gastric antrum, polypoid tissue at border of scar that was biopsied, normal duodenum.  Pathology from this showed superficial fragments of gastric mucosa with no specific pathologic diagnosis.  Negative for goblet cell intestinal metaplasia.  Negative for dysplasia.  Past Medical History:  Diagnosis Date  . Abdominal mass, RUQ (right upper quadrant)   . Anxiety   . Arthritis   . Asthma   . Bronchitis   . Carpal tunnel syndrome of left wrist 10/25/2014  . Chronic bronchitis, obstructive ([place]) 07/20/2017  . Chronic pain   . COPD (chronic obstructive pulmonary disease) ([place])   . Cyst of right  kidney 03/22/2018  . DDD (degenerative disc disease), lumbar 10/25/2014  . Degenerative joint disease (DJD) of hip   . Diarrhea of infectious origin 03/25/2018  . Dysphagia   . Emphysema of lung ([place])   . Facet syndrome, lumbar 10/25/2014  . Fatty liver 03/25/2018  . Foot fracture, left 05/02/15  . 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 GERD (gastroesophageal reflux disease)   . Hereditary and idiopathic peripheral neuropathy 11/06/2014  . IBS (irritable bowel syndrome)   . Low back pain   . Migraine 02/04/2016  . Occipital neuralgia   . 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 Other intervertebral disc degeneration, lumbar region   . Other irritable bowel syndrome 04/20/2017  . Other spondylosis with radiculopathy, lumbar region 10/25/2014  . RLS (restless legs syndrome)   . Sacroiliac joint dysfunction of both sides 10/25/2014  . Spondylosis of lumbar spine   . Urinary incontinence     Past Surgical History:  Procedure Laterality Date  . ABDOMINAL HYSTERECTOMY    . APPENDECTOMY    . BLADDER SURGERY     sling - Dr. Davis Gourd  . CESAREAN SECTION    . ESOPHAGOGASTRODUODENOSCOPY  04/26/2019  . STOMACH SURGERY      Prior to Admission medications   Medication Sig Start Date End Date Taking? Authorizing Provider  albuterol (VENTOLIN HFA) 108 (90 Base) MCG/ACT inhaler Inhale 2 puffs into the lungs every 6 (six) hours as needed for wheezing  or shortness of breath. 02/08/20  Yes Vallarie Mare M, PA-C  AMBULATORY NON FORMULARY MEDICATION Medication Name: nebulizer DX: J44.9 12/07/17  Yes Laverle Hobby, MD  BEVESPI AEROSPHERE 9-4.8 MCG/ACT AERO INHALE 2 PUFFS BY MOUTH INTO THE LUNGS 2TIMES DAILY 04/03/20  Yes Parrett, Tammy S, NP  esomeprazole (NEXIUM) 40 MG capsule Take 1 capsule (40 mg total) by mouth 2 (two) times daily. 10/26/19  Yes Volney American, PA-C  hydrOXYzine (ATARAX/VISTARIL) 25 MG tablet Take 1 tablet (25 mg total) by mouth 3 (three) times  daily as needed. 10/26/19  Yes Volney American, PA-C  ipratropium-albuterol (DUONEB) 0.5-2.5 (3) MG/3ML SOLN USE 1 VIAL (3ML TOTAL) BY NEBULIZATION EVERY 6 HOURS AS NEEDED FOR SHORTNESS OFBREATH AND WHEEZING. 12/13/19  Yes Parrett, Tammy S, NP  levothyroxine (SYNTHROID) 75 MCG tablet TAKE 1 TABLET BY MOUTH DAILY 04/04/20  Yes Cannady, Jolene T, NP  liraglutide (VICTOZA) 18 MG/3ML SOPN Inject 0.3 mLs (1.8 mg total) into the skin daily. 01/19/20  Yes Volney American, PA-C  montelukast (SINGULAIR) 10 MG tablet Take 1 tablet (10 mg total) by mouth at bedtime. 10/26/19  Yes Volney American, PA-C  ondansetron (ZOFRAN-ODT) 4 MG disintegrating tablet Take 4 mg by mouth every 8 (eight) hours as needed. 04/03/20  Yes [provider]  oxyCODONE (OXY IR/ROXICODONE) 5 MG immediate release tablet Take by mouth. Taking 2-3 times daily as needed 06/29/18  Yes [provider]  pramipexole (MIRAPEX) 0.5 MG tablet Take 1 tablet (0.5 mg total) by mouth 3 (three) times daily. 10/28/19  Yes Volney American, PA-C  tiZANidine (ZANAFLEX) 2 MG tablet Take by mouth 3 (three) times daily.  07/27/18  Yes [provider]  VESICARE 5 MG tablet Take 1 tablet (5 mg total) by mouth daily. 10/26/19  Yes Volney American, PA-C  Na Sulfate-K Sulfate-Mg Sulf 17.5-3.13-1.6 GM/177ML SOLN At 5 PM the day before procedure take 1 bottle and 5 hours before procedure take 1 bottle. 04/05/20   Virgel Manifold, MD    Family History  Problem Relation Age of Onset  . Neuropathy Mother   . Arthritis Father   . Hypertension Father   . Leukemia Father   . Heart disease Father        CAD s/p CABG  . Healthy Brother   . Healthy Daughter   . Healthy Son   . Dementia Maternal Grandmother   . Hypertension Maternal Grandmother   . Hypercholesterolemia Maternal Grandmother   . Heart disease Maternal Grandmother   . Healthy Brother   . Healthy Brother   . Stroke Neg Hx   . Cancer Neg Hx        Social History   Tobacco Use  . Smoking status: Current Every Day Smoker    Packs/day: 0.50    Years: 32.00    Pack years: 16.00    Types: Cigarettes  . Smokeless tobacco: Never Used  . Tobacco comment: Started smoking at age 30  Vaping Use  . Vaping Use: Never used  Substance Use Topics  . Alcohol use: Not Currently    Alcohol/week: 0.0 standard drinks    Comment: occassionally  . Drug use: No    Allergies as of 04/05/2020 - Review Complete 04/05/2020  Allergen Reaction Noted  . Penicillins Hives and Swelling 10/25/2014  . Acetaminophen  07/12/2019  . Ciprofloxacin Itching 08/12/2015  . Aspirin Palpitations 10/25/2014  . Hydrocodone Hives and Rash 12/01/2014  . Other Rash 05/07/2015  . Tape Rash 10/25/2014  Review of Systems:    All systems reviewed and negative except where noted in HPI.   Physical Exam:  BP 131/78   Pulse 69   Temp 98.3 F (36.8 C) (Oral)   Ht 5\' 1"  (1.549 m)   Wt 185 lb (83.9 kg)   BMI 34.96 kg/m  No LMP recorded. Patient has had a hysterectomy. Psych:  Alert and cooperative. Normal mood and affect. General:   Alert,  Well-developed, well-nourished, pleasant and cooperative in NAD Head:  Normocephalic and atraumatic. Eyes:  Sclera clear, no icterus.   Conjunctiva pink. Ears:  Normal auditory acuity. Nose:  No deformity, discharge, or lesions. Mouth:  No deformity or lesions,oropharynx pink & moist. Neck:  Supple; no masses or thyromegaly. Abdomen:  Normal bowel sounds.  No bruits.  Tender to superficial palpation right upper quadrant, soft,  and non-distended without masses, hepatosplenomegaly or hernias noted.  No guarding or rebound tenderness.    Msk:  Symmetrical without gross deformities. Good, equal movement & strength bilaterally. Pulses:  Normal pulses noted. Extremities:  No clubbing or edema.  No cyanosis. Neurologic:  Alert and oriented x3;  grossly normal neurologically. Skin:  Intact without significant lesions or  rashes. No jaundice. Lymph Nodes:  No significant cervical adenopathy. Psych:  Alert and cooperative. Normal mood and affect.   Labs: CBC    Component Value Date/Time   WBC 7.1 01/19/2020 0929   WBC 12.1 (H) 06/22/2017 0536   RBC 4.22 01/19/2020 0929   RBC 4.22 06/22/2017 0536   HGB 14.3 01/19/2020 0929   HCT 42.3 01/19/2020 0929   PLT CANCELED 01/19/2020 0929   MCV 100 (H) 01/19/2020 0929   MCV 103 (H) 03/15/2014 1716   MCH 33.9 (H) 01/19/2020 0929   MCH 32.6 06/22/2017 0536   MCHC 33.8 01/19/2020 0929   MCHC 33.4 06/22/2017 0536   RDW 11.7 01/19/2020 0929   RDW 13.0 03/15/2014 1716   LYMPHSABS 2.3 01/19/2020 0929   LYMPHSABS 2.9 10/29/2013 1812   MONOABS 0.8 11/30/2014 2322   MONOABS 0.7 10/29/2013 1812   EOSABS 0.3 01/19/2020 0929   EOSABS 0.2 10/29/2013 1812   BASOSABS 0.1 01/19/2020 0929   BASOSABS 0.1 10/29/2013 1812   CMP     Component Value Date/Time   NA 141 01/19/2020 0929   NA 141 03/15/2014 1609   K 4.3 01/19/2020 0929   K 3.5 03/15/2014 1609   CL 102 01/19/2020 0929   CL 109 (H) 03/15/2014 1609   CO2 26 01/19/2020 0929   CO2 27 03/15/2014 1609   GLUCOSE 92 01/19/2020 0929   GLUCOSE 103 (H) 06/22/2017 0536   GLUCOSE 88 03/15/2014 1609   BUN 8 01/19/2020 0929   BUN 6 (L) 03/15/2014 1609   CREATININE 0.56 (L) 01/19/2020 0929   CREATININE 0.52 (L) 03/15/2014 1609   CALCIUM 9.5 01/19/2020 0929   CALCIUM 8.2 (L) 03/15/2014 1609   PROT 6.7 01/19/2020 0929   PROT 7.2 10/29/2013 1812   ALBUMIN 4.5 01/19/2020 0929   ALBUMIN 4.1 10/29/2013 1812   AST 8 01/19/2020 0929   AST 17 10/29/2013 1812   ALT 5 01/19/2020 0929   ALT 13 10/29/2013 1812   ALKPHOS 95 01/19/2020 0929   ALKPHOS 87 10/29/2013 1812   BILITOT <0.2 01/19/2020 0929   BILITOT 0.3 10/29/2013 1812   GFRNONAA 110 01/19/2020 0929   GFRNONAA >60 03/15/2014 1609   GFRNONAA >60 12/16/2013 2140   GFRAA 127 01/19/2020 0929   GFRAA >60 03/15/2014 1609   GFRAA >  60 12/16/2013 2140    Imaging  Studies: Right upper quadrant ultrasound in August 2021 showed hepatic steatosis, normal gallbladder and bile ducts  Assessment and Plan:   XOEY WARMOTH is a 49 y.o. y/o female has been referred for right upper quadrant pain  Pain is very reproducible, and this combined with her clinical history is consistent with musculoskeletal pain  I have advised her to use ice packs 2-3 times a day in that area, and Tylenol as needed.  If pain does not subside to contact us again and she verbalized understanding  No indication for endoscopy or imaging at this time, especially given recent endoscopy about a year ago and ongoing symptoms chronically  Consider pain management referral by PCP if symptoms continue  Patient is due for screening colonoscopy and is agreeable to scheduling  Patient has had work-up for fatty liver in the past with [facility] GI, please see their notes      Dr Janet Murray  Speech recognition software was used to dictate the above note.

## 2020-04-09 ENCOUNTER — Other Ambulatory Visit
Admission: RE | Admit: 2020-04-09 | Discharge: 2020-04-09 | Disposition: A | Discharge status: Home / Self Care | Payer: Medicare Other | Source: Ambulatory Visit | Attending: Gastroenterology | Admitting: Gastroenterology

## 2020-04-09 ENCOUNTER — Other Ambulatory Visit: Payer: Self-pay

## 2020-04-09 DIAGNOSIS — Z01812 Encounter for preprocedural laboratory examination: Secondary | ICD-10-CM | POA: Insufficient documentation

## 2020-04-09 DIAGNOSIS — Z20822 Contact with and (suspected) exposure to covid-19: Secondary | ICD-10-CM | POA: Insufficient documentation

## 2020-04-10 LAB — SARS CORONAVIRUS 2 (TAT 6-24 HRS): SARS Coronavirus 2: NEGATIVE

## 2020-04-11 ENCOUNTER — Other Ambulatory Visit: Payer: Self-pay

## 2020-04-11 ENCOUNTER — Encounter
Admission: RE | Disposition: A | Discharge status: Home / Self Care | Payer: Self-pay | Source: Home / Self Care | Attending: Gastroenterology

## 2020-04-11 ENCOUNTER — Encounter: Payer: Self-pay | Admitting: Gastroenterology

## 2020-04-11 ENCOUNTER — Ambulatory Visit
Admission: RE | Admit: 2020-04-11 | Discharge: 2020-04-11 | Disposition: A | Discharge status: Home / Self Care | Payer: Medicare Other | Attending: Gastroenterology | Admitting: Gastroenterology

## 2020-04-11 ENCOUNTER — Ambulatory Visit: Payer: Medicare Other | Admitting: Anesthesiology

## 2020-04-11 DIAGNOSIS — Z1211 Encounter for screening for malignant neoplasm of colon: Secondary | ICD-10-CM

## 2020-04-11 DIAGNOSIS — D122 Benign neoplasm of ascending colon: Secondary | ICD-10-CM | POA: Diagnosis not present

## 2020-04-11 DIAGNOSIS — D125 Benign neoplasm of sigmoid colon: Secondary | ICD-10-CM | POA: Insufficient documentation

## 2020-04-11 DIAGNOSIS — K635 Polyp of colon: Secondary | ICD-10-CM | POA: Diagnosis not present

## 2020-04-11 DIAGNOSIS — K648 Other hemorrhoids: Secondary | ICD-10-CM | POA: Insufficient documentation

## 2020-04-11 HISTORY — PX: COLONOSCOPY WITH PROPOFOL: SHX5780

## 2020-04-11 SURGERY — COLONOSCOPY WITH PROPOFOL
Anesthesia: General

## 2020-04-11 MED ORDER — LIDOCAINE 2% (20 MG/ML) 5 ML SYRINGE
INTRAMUSCULAR | Status: DC | PRN
  Administered 2020-04-11: 100 mg via INTRAVENOUS

## 2020-04-11 MED ORDER — FENTANYL CITRATE (PF) 100 MCG/2ML IJ SOLN
INTRAMUSCULAR | Status: AC
  Filled 2020-04-11: qty 2

## 2020-04-11 MED ORDER — PROPOFOL 10 MG/ML IV BOLUS
INTRAVENOUS | Status: DC | PRN
  Administered 2020-04-11: 40 mg via INTRAVENOUS
  Administered 2020-04-11: 20 mg via INTRAVENOUS
  Administered 2020-04-11 (×2): 40 mg via INTRAVENOUS
  Administered 2020-04-11: 20 mg via INTRAVENOUS

## 2020-04-11 MED ORDER — PROPOFOL 500 MG/50ML IV EMUL
INTRAVENOUS | Status: DC | PRN
  Administered 2020-04-11: 150 ug/kg/min via INTRAVENOUS

## 2020-04-11 MED ORDER — SODIUM CHLORIDE 0.9 % IV SOLN
INTRAVENOUS | Status: DC
  Administered 2020-04-11: 20 mL/h via INTRAVENOUS

## 2020-04-11 MED ORDER — PROPOFOL 500 MG/50ML IV EMUL
INTRAVENOUS | Status: AC
  Filled 2020-04-11: qty 50

## 2020-04-11 MED ORDER — EPHEDRINE SULFATE 50 MG/ML IJ SOLN
INTRAMUSCULAR | Status: DC | PRN
  Administered 2020-04-11: 15 mg via INTRAVENOUS

## 2020-04-11 MED ORDER — LIDOCAINE HCL (PF) 2 % IJ SOLN
INTRAMUSCULAR | Status: AC
  Filled 2020-04-11: qty 5

## 2020-04-11 MED ORDER — MIDAZOLAM HCL 2 MG/2ML IJ SOLN
INTRAMUSCULAR | Status: AC
  Filled 2020-04-11: qty 2

## 2020-04-11 NOTE — Op Note (Signed)
[facility] Patient Name: Janet Murray Procedure Date: 04/11/2020 10:03 AM MRN: [id number] Account #: [id number] Date of Birth: 07/07/1970 Admit Type: Outpatient Age: 49 Room: [facility] ENDO ROOM 3 Gender: Female Note Status: Finalized Procedure:             Colonoscopy Indications:           Screening for colorectal malignant neoplasm Providers:             Cherith Tewell B. Bonna Gains MD, MD Referring MD:          Vickki Muff. Chrismon, MD (Referring MD) Medicines:             Monitored Anesthesia Care Complications:         No immediate complications. Procedure:             Pre-Anesthesia Assessment:                        - ASA Grade Assessment: II - A patient with mild                         systemic disease.                        - Prior to the procedure, a History and Physical was                         performed, and patient medications, allergies and                         sensitivities were reviewed. The patient's tolerance                         of previous anesthesia was reviewed.                        - The risks and benefits of the procedure and the                         sedation options and risks were discussed with the                         patient. All questions were answered and informed                         consent was obtained.                        - Patient identification and proposed procedure were                         verified prior to the procedure by the physician, the                         nurse, the anesthesiologist, the anesthetist and the                         technician. The procedure was verified in the                         procedure  room.                        After obtaining informed consent, the colonoscope was                         passed under direct vision. Throughout the procedure,                         the patient's blood pressure, pulse, and oxygen                         saturations were  monitored continuously. The                         Colonoscope was introduced through the anus and                         advanced to the the cecum, identified by appendiceal                         orifice and ileocecal valve. The colonoscopy was                         performed with ease. The patient tolerated the                         procedure well. The quality of the bowel preparation                         was fair. Findings:      The perianal and digital rectal examinations were normal.      A 3 mm polyp was found in the ascending colon. The polyp was sessile.       The polyp was removed with a cold biopsy forceps. Resection and       retrieval were complete.      The exam was otherwise without abnormality.      The rectum, sigmoid colon, descending colon, transverse colon, ascending       colon and cecum appeared normal. Biopsies for histology were taken with       a cold forceps from the entire colon for evaluation of microscopic       colitis.      Non-bleeding internal hemorrhoids were found during retroflexion.      Please note that images were taken throughout the exam. However, due to       equipment error they did not save. Impression:            - Preparation of the colon was fair.                        - One 3 mm polyp in the ascending colon, removed with                         a cold biopsy forceps. Resected and retrieved.                        - The examination was otherwise normal.                        -  The rectum, sigmoid colon, descending colon,                         transverse colon, ascending colon and cecum are                         normal. Biopsied.                        - Non-bleeding internal hemorrhoids. Recommendation:        - Discharge patient to home (with escort).                        - Advance diet as tolerated.                        - Continue present medications.                        - Await pathology results.                         - Repeat colonoscopy in 1 year with 2 day prep.                        - The findings and recommendations were discussed with                         the patient.                        - The findings and recommendations were discussed with                         the patient's family.                        - Return to primary care physician as previously                         scheduled.                        - High fiber diet. Procedure Code(s):     --- Professional ---                        [phone], Colonoscopy, flexible; with biopsy, single or                         multiple Diagnosis Code(s):     --- Professional ---                        Z12.11, Encounter for screening for malignant neoplasm                         of colon                        K63.5, Polyp of colon CPT copyright 2019 American Medical Association. All rights reserved. The codes documented in this report are preliminary and upon coder review may  be revised  to meet current compliance requirements.  [name], MD Margretta Sidle B. Bonna Gains MD, MD 04/11/2020 11:01:20 AM This report has been signed electronically. Number of Addenda: 0 Note Initiated On: 04/11/2020 10:03 AM Scope Withdrawal Time: 0 hours 13 minutes 1 second  Total Procedure Duration: 0 hours 17 minutes 3 seconds  Estimated Blood Loss:  Estimated blood loss: none.      [facility]

## 2020-04-11 NOTE — H&P (Signed)
[name], MD [address] [place], [place], [place], [address] [address], [place], [place], [place], [address] Phone: [phone]  Fax: [phone]  Primary Care Physician:  Volney American, PA-C   Pre-Procedure History & Physical: HPI:  Janet Murray is a 49 y.o. female is here for a colonoscopy.   Past Medical History:  Diagnosis Date  . Abdominal mass, RUQ (right upper quadrant)   . Anxiety   . Arthritis   . Asthma   . Bronchitis   . Carpal tunnel syndrome of left wrist 10/25/2014  . Chronic bronchitis, obstructive ([place]) 07/20/2017  . Chronic pain   . COPD (chronic obstructive pulmonary disease) ([place])   . Cyst of right kidney 03/22/2018  . DDD (degenerative disc disease), lumbar 10/25/2014  . Degenerative joint disease (DJD) of hip   . Diarrhea of infectious origin 03/25/2018  . Dysphagia   . Emphysema of lung ([place])   . Facet syndrome, lumbar 10/25/2014  . Fatty liver 03/25/2018  . Foot fracture, left 05/02/15  . 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 GERD (gastroesophageal reflux disease)   . Hereditary and idiopathic peripheral neuropathy 11/06/2014  . IBS (irritable bowel syndrome)   . Low back pain   . Migraine 02/04/2016  . Occipital neuralgia   . 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 Other intervertebral disc degeneration, lumbar region   . Other irritable bowel syndrome 04/20/2017  . Other spondylosis with radiculopathy, lumbar region 10/25/2014  . RLS (restless legs syndrome)   . Sacroiliac joint dysfunction of both sides 10/25/2014  . Spondylosis of lumbar spine   . Urinary incontinence     Past Surgical History:  Procedure Laterality Date  . ABDOMINAL HYSTERECTOMY    . APPENDECTOMY    . BLADDER SURGERY     sling - Dr. Davis Gourd  . CESAREAN SECTION    . ESOPHAGOGASTRODUODENOSCOPY  04/26/2019  . STOMACH SURGERY      Prior to Admission medications   Medication Sig Start Date End Date Taking?  Authorizing Provider  albuterol (VENTOLIN HFA) 108 (90 Base) MCG/ACT inhaler Inhale 2 puffs into the lungs every 6 (six) hours as needed for wheezing or shortness of breath. 02/08/20   Lannie Fields, PA-C  AMBULATORY NON FORMULARY MEDICATION Medication Name: nebulizer DX: J44.9 12/07/17   Laverle Hobby, MD  BEVESPI AEROSPHERE 9-4.8 MCG/ACT AERO INHALE 2 PUFFS BY MOUTH INTO THE LUNGS 2TIMES DAILY 04/03/20   Parrett, Fonnie Mu, NP  esomeprazole (NEXIUM) 40 MG capsule Take 1 capsule (40 mg total) by mouth 2 (two) times daily. 10/26/19   Volney American, PA-C  hydrOXYzine (ATARAX/VISTARIL) 25 MG tablet Take 1 tablet (25 mg total) by mouth 3 (three) times daily as needed. 10/26/19   Volney American, PA-C  ipratropium-albuterol (DUONEB) 0.5-2.5 (3) MG/3ML SOLN USE 1 VIAL (3ML TOTAL) BY NEBULIZATION EVERY 6 HOURS AS NEEDED FOR SHORTNESS OFBREATH AND WHEEZING. 12/13/19   Parrett, Fonnie Mu, NP  levothyroxine (SYNTHROID) 75 MCG tablet TAKE 1 TABLET BY MOUTH DAILY 04/04/20   Cannady, Jolene T, NP  liraglutide (VICTOZA) 18 MG/3ML SOPN Inject 0.3 mLs (1.8 mg total) into the skin daily. 01/19/20   Volney American, PA-C  montelukast (SINGULAIR) 10 MG tablet Take 1 tablet (10 mg total) by mouth at bedtime. 10/26/19   Volney American, PA-C  Na Sulfate-K Sulfate-Mg Sulf 17.5-3.13-1.6 GM/177ML SOLN At 5 PM the day before procedure take 1 bottle and 5 hours before procedure take 1 bottle. 04/05/20  Virgel Manifold, MD  ondansetron (ZOFRAN-ODT) 4 MG disintegrating tablet Take 4 mg by mouth every 8 (eight) hours as needed. 04/03/20   [provider]  oxyCODONE (OXY IR/ROXICODONE) 5 MG immediate release tablet Take by mouth. Taking 2-3 times daily as needed 06/29/18   [provider]  pramipexole (MIRAPEX) 0.5 MG tablet Take 1 tablet (0.5 mg total) by mouth 3 (three) times daily. 10/28/19   Volney American, PA-C  tiZANidine (ZANAFLEX) 2 MG tablet Take by mouth 3 (three)  times daily.  07/27/18   [provider]  VESICARE 5 MG tablet Take 1 tablet (5 mg total) by mouth daily. 10/26/19   Volney American, PA-C    Allergies as of 04/06/2020 - Review Complete 04/05/2020  Allergen Reaction Noted  . Penicillins Hives and Swelling 10/25/2014  . Acetaminophen  07/12/2019  . Ciprofloxacin Itching 08/12/2015  . Aspirin Palpitations 10/25/2014  . Hydrocodone Hives and Rash 12/01/2014  . Other Rash 05/07/2015  . Tape Rash 10/25/2014    Family History  Problem Relation Age of Onset  . Neuropathy Mother   . Arthritis Father   . Hypertension Father   . Leukemia Father   . Heart disease Father        CAD s/p CABG  . Healthy Brother   . Healthy Daughter   . Healthy Son   . Dementia Maternal Grandmother   . Hypertension Maternal Grandmother   . Hypercholesterolemia Maternal Grandmother   . Heart disease Maternal Grandmother   . Healthy Brother   . Healthy Brother   . Stroke Neg Hx   . Cancer Neg Hx     Social History   Socioeconomic History  . Marital status: Single    Spouse name: Not on file  . Number of children: Not on file  . Years of education: Not on file  . Highest education level: Not on file  Occupational History  . Not on file  Tobacco Use  . Smoking status: Current Every Day Smoker    Packs/day: 0.50    Years: 32.00    Pack years: 16.00    Types: Cigarettes  . Smokeless tobacco: Never Used  . Tobacco comment: Started smoking at age 89  Vaping Use  . Vaping Use: Never used  Substance and Sexual Activity  . Alcohol use: Not Currently    Alcohol/week: 0.0 standard drinks    Comment: occassionally  . Drug use: No  . Sexual activity: Not Currently  Other Topics Concern  . Not on file  Social History Narrative  . Not on file   Social Determinants of Health   Financial Resource Strain:   . Difficulty of Paying Living Expenses: Not on file  Food Insecurity:   . Worried About Charity fundraiser in the Last Year:  Not on file  . Ran Out of Food in the Last Year: Not on file  Transportation Needs:   . Lack of Transportation (Medical): Not on file  . Lack of Transportation (Non-Medical): Not on file  Physical Activity:   . Days of Exercise per Week: Not on file  . Minutes of Exercise per Session: Not on file  Stress:   . Feeling of Stress : Not on file  Social Connections:   . Frequency of Communication with Friends and Family: Not on file  . Frequency of Social Gatherings with Friends and Family: Not on file  . Attends Religious Services: Not on file  . Active Member of Clubs or  Organizations: Not on file  . Attends Archivist Meetings: Not on file  . Marital Status: Not on file  Intimate Partner Violence:   . Fear of Current or Ex-Partner: Not on file  . Emotionally Abused: Not on file  . Physically Abused: Not on file  . Sexually Abused: Not on file    Review of Systems: See HPI, otherwise negative ROS  Physical Exam: There were no vitals taken for this visit. General:   Alert,  pleasant and cooperative in NAD Head:  Normocephalic and atraumatic. Neck:  Supple; no masses or thyromegaly. Lungs:  Clear throughout to auscultation, normal respiratory effort.    Heart:  +S1, +S2, Regular rate and rhythm, No edema. Abdomen:  Soft, nontender and nondistended. Normal bowel sounds, without guarding, and without rebound.   Neurologic:  Alert and  oriented x4;  grossly normal neurologically.  Impression/Plan: Janet Murray is here for a colonoscopy to be performed for average risk screening.  Risks, benefits, limitations, and alternatives regarding  colonoscopy have been reviewed with the patient.  Questions have been answered.  All parties agreeable.   Virgel Manifold, MD  04/11/2020, 10:04 AM

## 2020-04-11 NOTE — Anesthesia Preprocedure Evaluation (Signed)
Anesthesia Evaluation  Patient identified by MRN, date of birth, ID band Patient awake    Reviewed: Allergy & Precautions, H&P , NPO status , Patient's Chart, lab work & pertinent test results  Airway Mallampati: III  TM Distance: <3 FB Neck ROM: limited    Dental  (+) Poor Dentition, Missing   Pulmonary shortness of breath and with exertion, asthma , COPD, Current Smoker and Patient abstained from smoking.,    Pulmonary exam normal        Cardiovascular Exercise Tolerance: Good (-) angina(-) Past MI negative cardio ROS Normal cardiovascular exam     Neuro/Psych  Headaches, PSYCHIATRIC DISORDERS  Neuromuscular disease    GI/Hepatic negative GI ROS, Neg liver ROS, GERD  ,  Endo/Other  negative endocrine ROSHypothyroidism   Renal/GU Renal disease     Musculoskeletal   Abdominal   Peds  Hematology negative hematology ROS (+)   Anesthesia Other Findings Past Medical History: No date: Abdominal mass, RUQ (right upper quadrant) No date: Anxiety No date: Arthritis No date: Asthma No date: Bronchitis 10/25/2014: Carpal tunnel syndrome of left wrist 07/20/2017: Chronic bronchitis, obstructive ([facility]) No date: Chronic pain No date: COPD (chronic obstructive pulmonary disease) ([place]) 03/22/2018: Cyst of right kidney 10/25/2014: DDD (degenerative disc disease), lumbar No date: Degenerative joint disease (DJD) of hip 03/25/2018: Diarrhea of infectious origin No date: Dysphagia No date: Emphysema of lung ([place]) 10/25/2014: Facet syndrome, lumbar 03/25/2018: Fatty liver 05/02/15: Foot fracture, left 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 No date: GERD (gastroesophageal reflux disease) 11/06/2014: Hereditary and idiopathic peripheral neuropathy No date: IBS (irritable bowel syndrome) No date: Low back pain 02/04/2016: Migraine No date: Occipital neuralgia 10/25/2014: Osteoarthritis of spine with radiculopathy, lumbar region No date:  Other intervertebral disc degeneration, lumbar region 04/20/2017: Other irritable bowel syndrome 10/25/2014: Other spondylosis with radiculopathy, lumbar region No date: RLS (restless legs syndrome) 10/25/2014: Sacroiliac joint dysfunction of both sides No date: Spondylosis of lumbar spine No date: Urinary incontinence  Past Surgical History: No date: ABDOMINAL HYSTERECTOMY No date: APPENDECTOMY No date: BLADDER SURGERY     Comment:  sling - Dr. Davis Gourd No date: CESAREAN SECTION 04/26/2019: ESOPHAGOGASTRODUODENOSCOPY No date: STOMACH SURGERY  BMI    Body Mass Index: 34.39 kg/m      Reproductive/Obstetrics negative OB ROS                             Anesthesia Physical Anesthesia Plan  ASA: III  Anesthesia Plan: General   Post-op Pain Management:    Induction: Intravenous  PONV Risk Score and Plan: Propofol infusion and TIVA  Airway Management Planned: Natural Airway and Nasal Cannula  Additional Equipment:   Intra-op Plan:   Post-operative Plan:   Informed Consent: I have reviewed the patients History and Physical, chart, labs and discussed the procedure including the risks, benefits and alternatives for the proposed anesthesia with the patient or authorized representative who has indicated his/her understanding and acceptance.     Dental Advisory Given  Plan Discussed with: Anesthesiologist, CRNA and Surgeon  Anesthesia Plan Comments: (Patient consented for risks of anesthesia including but not limited to:  - adverse reactions to medications - risk of intubation if required - damage to eyes, teeth, lips or other oral mucosa - nerve damage due to positioning  - sore throat or hoarseness - Damage to heart, brain, nerves, lungs, other parts of body or loss of life  Patient voiced understanding.)        Anesthesia Quick  Evaluation

## 2020-04-11 NOTE — Transfer of Care (Signed)
Immediate Anesthesia Transfer of Care Note  Patient: Janet Murray  Procedure(s) Performed: COLONOSCOPY WITH PROPOFOL (N/A )  Patient Location: PACU  Anesthesia Type:General  Level of Consciousness: awake, alert  and oriented  Airway & Oxygen Therapy: Patient Spontanous Breathing and Patient connected to nasal cannula oxygen  Post-op Assessment: Report given to RN and Post -op Vital signs reviewed and stable  Post vital signs: Reviewed and stable  Last Vitals:  Vitals Value Taken Time  BP 111/61 04/11/20 1105  Temp 36.2 C 04/11/20 1105  Pulse 71 04/11/20 1112  Resp 16 04/11/20 1112  SpO2 98 % 04/11/20 1112  Vitals shown include unvalidated device data.  Last Pain:  Vitals:   04/11/20 1105  TempSrc: Temporal  PainSc: 6          Complications: No complications documented.

## 2020-04-11 NOTE — Anesthesia Postprocedure Evaluation (Signed)
Anesthesia Post Note  Patient: Janet Murray  Procedure(s) Performed: COLONOSCOPY WITH PROPOFOL (N/A )  Patient location during evaluation: Endoscopy Anesthesia Type: General Level of consciousness: awake and alert Pain management: pain level controlled Vital Signs Assessment: post-procedure vital signs reviewed and stable Respiratory status: spontaneous breathing, nonlabored ventilation, respiratory function stable and patient connected to nasal cannula oxygen Cardiovascular status: blood pressure returned to baseline and stable Postop Assessment: no apparent nausea or vomiting Anesthetic complications: no   No complications documented.   Last Vitals:  Vitals:   04/11/20 1115 04/11/20 1125  BP: (!) 91/53 (!) 92/48  Pulse: 67 64  Resp: 17 (!) 22  Temp:    SpO2: 97% 94%    Last Pain:  Vitals:   04/11/20 1125  TempSrc:   PainSc: 0-No pain                 Janet Murray

## 2020-04-12 ENCOUNTER — Ambulatory Visit (INDEPENDENT_AMBULATORY_CARE_PROVIDER_SITE_OTHER): Payer: Medicare Other | Admitting: Unknown Physician Specialty

## 2020-04-12 ENCOUNTER — Encounter: Payer: Self-pay | Admitting: Gastroenterology

## 2020-04-12 DIAGNOSIS — E6609 Other obesity due to excess calories: Secondary | ICD-10-CM

## 2020-04-12 DIAGNOSIS — Z6835 Body mass index (BMI) 35.0-35.9, adult: Secondary | ICD-10-CM | POA: Diagnosis not present

## 2020-04-12 DIAGNOSIS — E66812 Obesity, class 2: Secondary | ICD-10-CM

## 2020-04-12 DIAGNOSIS — E042 Nontoxic multinodular goiter: Secondary | ICD-10-CM

## 2020-04-12 LAB — SURGICAL PATHOLOGY

## 2020-04-12 MED ORDER — INSULIN PEN NEEDLE 32G X 4 MM MISC: 1.0000 [IU] | Freq: Every morning | 3 refills | Status: DC

## 2020-04-12 NOTE — Assessment & Plan Note (Signed)
Followed by Endocrine but gets labs done here.  Will add a thyroid panel

## 2020-04-12 NOTE — Assessment & Plan Note (Signed)
Janet Murray was denied.  Prescribed Victoza as an alternative but hasn't started yet.  Needs pen needles.  Instructions provided.

## 2020-04-12 NOTE — Progress Notes (Signed)
BP 112/70   Pulse 71   Temp 98.4 F (36.9 C) (Oral)   Wt 179 lb 12.8 oz (81.6 kg)   SpO2 98%   BMI 33.97 kg/m    Subjective:    Patient ID: Janet Murray, female    DOB: Mar 25, 1971, 49 y.o.   MRN: [id number]  HPI: Janet Murray is a 49 y.o. female  Chief Complaint  Patient presents with  . Thyroid Nodule    pt states she was told to have labs done   . Medication Problem    pt states she has a question about her medication Victoza she is unsure how to use it   Pt states she has a question about Victoza.  The was given by [name] to help her lose weight.  After [name] was denied back in August.  She has not yet tried it.  She doesn't understand how to take it.    Pt has 5 thyroid nodules monitored by Endocrine.  She needs to keep track of thyroid labs.     Relevant past medical, surgical, family and social history reviewed and updated as indicated. Interim medical history since our last visit reviewed. Allergies and medications reviewed and updated.  Review of Systems  Per HPI unless specifically indicated above     Objective:    BP 112/70   Pulse 71   Temp 98.4 F (36.9 C) (Oral)   Wt 179 lb 12.8 oz (81.6 kg)   SpO2 98%   BMI 33.97 kg/m   Wt Readings from Last 3 Encounters:  04/12/20 179 lb 12.8 oz (81.6 kg)  04/11/20 182 lb (82.6 kg)  04/05/20 185 lb (83.9 kg)    Physical Exam Constitutional:      General: She is not in acute distress.    Appearance: Normal appearance. She is well-developed.  HENT:     Head: Normocephalic and atraumatic.  Eyes:     General: Lids are normal. No scleral icterus.       Right eye: No discharge.        Left eye: No discharge.     Conjunctiva/sclera: Conjunctivae normal.  Neck:     Vascular: No carotid bruit or JVD.  Cardiovascular:     Rate and Rhythm: Normal rate and regular rhythm.     Heart sounds: Normal heart sounds.  Pulmonary:     Effort: Pulmonary effort is normal.     Breath sounds: Normal breath sounds.    Abdominal:     Palpations: There is no hepatomegaly or splenomegaly.  Musculoskeletal:        General: Normal range of motion.     Cervical back: Normal range of motion and neck supple.  Skin:    General: Skin is warm and dry.     Coloration: Skin is not pale.     Findings: No rash.  Neurological:     Mental Status: She is alert and oriented to person, place, and time.  Psychiatric:        Behavior: Behavior normal.        Thought Content: Thought content normal.        Judgment: Judgment normal.       Assessment & Plan:   Problem List Items Addressed This Visit      Unprioritized   Multiple thyroid nodules    Followed by Endocrine but gets labs done here.  Will add a thyroid panel      Relevant Orders   Thyroid Panel  With TSH   Obesity    Saxenda was denied.  Prescribed Victoza as an alternative but hasn't started yet.  Needs pen needles.  Instructions provided.            Follow up plan: Return in about 4 weeks (around 05/10/2020).

## 2020-04-13 LAB — THYROID PANEL WITH TSH
Free Thyroxine Index: 1.4 (ref 1.2–4.9)
T3 Uptake Ratio: 23 % — ABNORMAL LOW (ref 24–39)
T4, Total: 6.3 ug/dL (ref 4.5–12.0)
TSH: 11.8 u[IU]/mL — ABNORMAL HIGH (ref 0.450–4.500)

## 2020-06-05 ENCOUNTER — Other Ambulatory Visit: Payer: Self-pay | Admitting: Family Medicine

## 2020-06-05 DIAGNOSIS — K219 Gastro-esophageal reflux disease without esophagitis: Secondary | ICD-10-CM

## 2020-06-28 ENCOUNTER — Other Ambulatory Visit: Payer: Self-pay | Admitting: Unknown Physician Specialty

## 2020-06-28 NOTE — Telephone Encounter (Signed)
Non delegated refill prescribed by a historical provider for Zofran.

## 2020-07-09 ENCOUNTER — Telehealth: Payer: Self-pay | Admitting: Nurse Practitioner

## 2020-07-09 NOTE — Telephone Encounter (Signed)
Copied from [place] [phone]. Topic: Medicare AWV >> Jul 09, 2020  1:33 PM Cher Nakai R wrote: Reason for CRM:  Left message for patient to call back and schedule the Medicare Annual Wellness Visit (AWV) virtually or by telephone.  Last AWV 07/12/2019  Please schedule at anytime with [facility]-Nurse Health Advisor.  45 minute appointment  Any questions, please call me at [phone]

## 2020-07-17 ENCOUNTER — Ambulatory Visit: Payer: Medicare Other

## 2020-09-24 NOTE — Progress Notes (Signed)
BP 115/76   Pulse 72   Temp 97.9 F (36.6 C)   Wt 188 lb (85.3 kg)   SpO2 98%   BMI 35.52 kg/m    Subjective:    Patient ID: Janet Murray, female    DOB: [birth year], 50 y.o.   MRN: [id number]  HPI: Janet Murray is a 50 y.o. female  Chief Complaint  Patient presents with  . Other    Dr.Crisp wanted her to be checked to make sure she does not have a cyst. Swelling low back top of buttucks  . Chest Pain   CYST Patient presents to clinic with complaints of a low back cyst.  Patient states she has been having pain above her buttocks for about 2 weeks.  Patient states she has not had it before. Buttocks feels like it is inflamed, hurts to walk or bend over.   CHEST PAIN/ SOB Patient states she has been having congestion, SOB, and chest pain.  Patient feels like her heart is racing at times.  Not currently happening in the office.  Patient feels like her chest "catches" at times.  Patient has been using inhalers regularly.  Denies HA, dizziness, palpitations, visual changes, and lower extremity swelling.  HIP PAIN Patient would like a referral to Ortho.  Patient's pain management doctor states she has bursitis and needs to see an Orthopedic.   Relevant past medical, surgical, family and social history reviewed and updated as indicated. Interim medical history since our last visit reviewed. Allergies and medications reviewed and updated.  Review of Systems  Eyes: Negative for visual disturbance.  Respiratory: Positive for cough, chest tightness and shortness of breath.   Cardiovascular: Positive for chest pain. Negative for palpitations and leg swelling.  Musculoskeletal:       Hip pain  Skin:       Pain and tenderness right above buttocks.   Neurological: Negative for dizziness and headaches.    Per HPI unless specifically indicated above     Objective:    BP 115/76   Pulse 72   Temp 97.9 F (36.6 C)   Wt 188 lb (85.3 kg)   SpO2 98%   BMI 35.52 kg/m   Wt Readings  from Last 3 Encounters:  09/25/20 188 lb (85.3 kg)  04/12/20 179 lb 12.8 oz (81.6 kg)  04/11/20 182 lb (82.6 kg)    Physical Exam Vitals and nursing note reviewed.  Constitutional:      General: She is not in acute distress.    Appearance: Normal appearance. She is normal weight. She is not ill-appearing, toxic-appearing or diaphoretic.  HENT:     Head: Normocephalic.     Right Ear: External ear normal.     Left Ear: External ear normal.     Nose: Nose normal.     Mouth/Throat:     Mouth: Mucous membranes are moist.     Pharynx: Oropharynx is clear.  Eyes:     General:        Right eye: No discharge.        Left eye: No discharge.     Extraocular Movements: Extraocular movements intact.     Conjunctiva/sclera: Conjunctivae normal.     Pupils: Pupils are equal, round, and reactive to light.  Cardiovascular:     Rate and Rhythm: Normal rate and regular rhythm.     Heart sounds: Normal heart sounds. No murmur heard.   Pulmonary:     Effort: Pulmonary effort is normal. No  tachypnea, accessory muscle usage or respiratory distress.     Breath sounds: Normal breath sounds. Decreased air movement present. No wheezing or rales.  Musculoskeletal:        General: No swelling or tenderness. Normal range of motion.     Cervical back: Normal range of motion and neck supple.     Right lower leg: No edema.     Left lower leg: No edema.  Skin:    General: Skin is warm and dry.     Capillary Refill: Capillary refill takes less than 2 seconds.       Neurological:     General: No focal deficit present.     Mental Status: She is alert and oriented to person, place, and time. Mental status is at baseline.  Psychiatric:        Mood and Affect: Mood normal.        Behavior: Behavior normal.        Thought Content: Thought content normal.        Judgment: Judgment normal.     Results for orders placed or performed in visit on 04/12/20  Thyroid Panel With TSH  Result Value Ref Range    TSH 11.800 (H) 0.450 - 4.500 uIU/mL   T4, Total 6.3 4.5 - 12.0 ug/dL   T3 Uptake Ratio 23 (L) 24 - 39 %   Free Thyroxine Index 1.4 1.2 - 4.9      Assessment & Plan:   Problem List Items Addressed This Visit   None   Visit Diagnoses    Shortness of breath    -  Primary   EKG normal. Chest xray ordered. Encouraged to use inhalers regularly.  Will make recommendations from imaging.  Follow up in 1 week.   Relevant Orders   DG Chest 2 View   Pilonidal cyst       Patient started on antibiotics. Urgent referral placed to general surgery for further evaluation and management.    Relevant Orders   Ambulatory referral to General Surgery   Chest pain, unspecified type       EKG normal. Chest xray ordered. Encouraged to use inhalers regularly.  Will make recommendations from imaging.  Follow up in 1 week.   Relevant Orders   EKG 12-Lead (Completed)   Hip pain       Referral placed for patient to see Orthopedic doctor.  Pain managmenet recommended it stating she has bursitis.   Relevant Orders   Ambulatory referral to Orthopedics       Follow up plan: Return in about 1 week (around 10/02/2020) for breathing check.

## 2020-09-25 ENCOUNTER — Ambulatory Visit (INDEPENDENT_AMBULATORY_CARE_PROVIDER_SITE_OTHER): Payer: Medicare Other | Admitting: Nurse Practitioner

## 2020-09-25 ENCOUNTER — Ambulatory Visit
Admission: RE | Admit: 2020-09-25 | Discharge: 2020-09-25 | Disposition: A | Discharge status: Home / Self Care | Payer: Medicare Other | Source: Ambulatory Visit | Attending: Nurse Practitioner | Admitting: Nurse Practitioner

## 2020-09-25 ENCOUNTER — Ambulatory Visit
Admission: RE | Admit: 2020-09-25 | Discharge: 2020-09-25 | Disposition: A | Discharge status: Home / Self Care | Payer: Medicare Other | Attending: Nurse Practitioner | Admitting: Nurse Practitioner

## 2020-09-25 ENCOUNTER — Other Ambulatory Visit: Payer: Self-pay

## 2020-09-25 ENCOUNTER — Encounter: Payer: Self-pay | Admitting: Nurse Practitioner

## 2020-09-25 VITALS — BP 115/76 | HR 72 | Temp 97.9°F | Wt 188.0 lb

## 2020-09-25 DIAGNOSIS — M25559 Pain in unspecified hip: Secondary | ICD-10-CM | POA: Diagnosis not present

## 2020-09-25 DIAGNOSIS — R079 Chest pain, unspecified: Secondary | ICD-10-CM | POA: Diagnosis not present

## 2020-09-25 DIAGNOSIS — L0591 Pilonidal cyst without abscess: Secondary | ICD-10-CM | POA: Diagnosis not present

## 2020-09-25 DIAGNOSIS — R0602 Shortness of breath: Secondary | ICD-10-CM

## 2020-09-25 MED ORDER — SULFAMETHOXAZOLE-TRIMETHOPRIM 800-160 MG PO TABS: 1.0000 | ORAL_TABLET | Freq: Two times a day (BID) | ORAL | 0 refills | Status: AC

## 2020-09-26 ENCOUNTER — Encounter: Payer: Self-pay | Admitting: Nurse Practitioner

## 2020-09-26 NOTE — Progress Notes (Signed)
EKG showed NSR.  Results discussed with patient in office.

## 2020-09-26 NOTE — Progress Notes (Signed)
Please let Ms. Janet Murray know that her chest xray does not show pneumonia.  However, there is some hyperinflation of her lungs which is commonly seen with COPD.  She should continue using her inhaler. If she is still having shortness of breath next week when she comes in for an appointment we can consider further workup.

## 2020-09-27 ENCOUNTER — Encounter: Payer: Self-pay | Admitting: General Surgery

## 2020-09-27 ENCOUNTER — Ambulatory Visit (INDEPENDENT_AMBULATORY_CARE_PROVIDER_SITE_OTHER): Payer: Medicare Other | Admitting: General Surgery

## 2020-09-27 ENCOUNTER — Other Ambulatory Visit: Payer: Self-pay

## 2020-09-27 VITALS — BP 111/65 | HR 84 | Temp 98.5°F | Ht 61.0 in | Wt 186.8 lb

## 2020-09-27 DIAGNOSIS — M533 Sacrococcygeal disorders, not elsewhere classified: Secondary | ICD-10-CM | POA: Diagnosis not present

## 2020-09-27 NOTE — Patient Instructions (Addendum)
We did not see anything abnormal with your exam. You may need some imaging and your pain doctor may do these.   Follow-up with our office as needed.  Please call and ask to speak with a nurse if you develop questions or concerns.

## 2020-09-27 NOTE — Progress Notes (Signed)
Patient ID: Janet Murray, female   DOB: 05/27/1971, 50 y.o.   MRN: [id number]  Chief Complaint  Patient presents with  . New Patient (Initial Visit)    Pilonidal cyst    HPI Janet Murray is a 50 y.o. female.  She has been referred by her pain management doctor, Dr. Mohammed Kindle, for evaluation of a possible pilonidal cyst.  She states that her "butt bone" has been hurting for about a week and a half.  She feels like it is swollen but says that nothing is visible from the outside.  There is no drainage from the site.  No fevers or chills, no nausea or vomiting.  She says that it is very sore and seems to radiate down her sacrococcygeal region.  She is unable to sit comfortably.  When she discussed this with Dr. Primus Bravo, she says that he thought it might be a pilonidal cyst and referred her to surgery.  She says that she told him that there was nothing there for surgery to remove.  She reports that she has never had similar pain in the past.   Past Medical History:  Diagnosis Date  . Abdominal mass, RUQ (right upper quadrant)   . Anxiety   . Arthritis   . Asthma   . Bronchitis   . Carpal tunnel syndrome of left wrist 10/25/2014  . Chronic bronchitis, obstructive ([place]) 07/20/2017  . Chronic pain   . COPD (chronic obstructive pulmonary disease) ([place])   . Cyst of right kidney 03/22/2018  . DDD (degenerative disc disease), lumbar 10/25/2014  . Degenerative joint disease (DJD) of hip   . Diarrhea of infectious origin 03/25/2018  . Dysphagia   . Emphysema of lung ([place])   . Facet syndrome, lumbar 10/25/2014  . Fatty liver 03/25/2018  . Foot fracture, left 05/02/15  . 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 GERD (gastroesophageal reflux disease)   . Hereditary and idiopathic peripheral neuropathy 11/06/2014  . IBS (irritable bowel syndrome)   . Low back pain   . Migraine 02/04/2016  . Occipital neuralgia   . 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 Other intervertebral  disc degeneration, lumbar region   . Other irritable bowel syndrome 04/20/2017  . Other spondylosis with radiculopathy, lumbar region 10/25/2014  . RLS (restless legs syndrome)   . Sacroiliac joint dysfunction of both sides 10/25/2014  . Spondylosis of lumbar spine   . Urinary incontinence     Past Surgical History:  Procedure Laterality Date  . ABDOMINAL HYSTERECTOMY    . APPENDECTOMY    . BLADDER SURGERY     sling - Dr. Davis Gourd  . CESAREAN SECTION    . COLONOSCOPY WITH PROPOFOL N/A 04/11/2020   Procedure: COLONOSCOPY WITH PROPOFOL;  Surgeon: Virgel Manifold, MD;  Location: [facility] ENDOSCOPY;  Service: Endoscopy;  Laterality: N/A;  . ESOPHAGOGASTRODUODENOSCOPY  04/26/2019  . STOMACH SURGERY      Family History  Problem Relation Age of Onset  . Neuropathy Mother   . Arthritis Father   . Hypertension Father   . Leukemia Father   . Heart disease Father        CAD s/p CABG  . Healthy Brother   . Healthy Daughter   . Healthy Son   . Dementia Maternal Grandmother   . Hypertension Maternal Grandmother   . Hypercholesterolemia Maternal Grandmother   . Heart disease Maternal Grandmother   . Healthy Brother   . Healthy Brother   . Stroke Neg Hx   .  Cancer Neg Hx     Social History Social History   Tobacco Use  . Smoking status: Current Every Day Smoker    Packs/day: 0.50    Years: 32.00    Pack years: 16.00    Types: Cigarettes  . Smokeless tobacco: Never Used  . Tobacco comment: Started smoking at age 82  Vaping Use  . Vaping Use: Never used  Substance Use Topics  . Alcohol use: Not Currently    Alcohol/week: 0.0 standard drinks    Comment: occassionally  . Drug use: No    Allergies  Allergen Reactions  . Penicillins Hives and Swelling    Has patient had a PCN reaction causing immediate rash, facial/tongue/throat swelling, SOB or lightheadedness with hypotension: Yes Has patient had a PCN reaction causing severe rash involving mucus membranes or skin  necrosis: No Has patient had a PCN reaction that required hospitalization No Has patient had a PCN reaction occurring within the last 10 years: No If all of the above answers are "NO", then may proceed with Cephalosporin use.  . Acetaminophen     Due to polyps   . Ciprofloxacin Itching  . Aspirin Palpitations    Heart palpitations  . Hydrocodone Hives and Rash  . Other Rash    bandaids  . Tape Rash    bandaids bandaids    Current Outpatient Medications  Medication Sig Dispense Refill  . albuterol (VENTOLIN HFA) 108 (90 Base) MCG/ACT inhaler Inhale 2 puffs into the lungs every 6 (six) hours as needed for wheezing or shortness of breath. 8 g 2  . AMBULATORY NON FORMULARY MEDICATION Medication Name: nebulizer DX: J44.9 1 each 0  . BEVESPI AEROSPHERE 9-4.8 MCG/ACT AERO INHALE 2 PUFFS BY MOUTH INTO THE LUNGS 2TIMES DAILY 10.7 g 2  . diclofenac Sodium (VOLTAREN) 1 % GEL Apply topically.    Marland Kitchen esomeprazole (NEXIUM) 40 MG capsule TAKE ONE CAPSULE BY MOUTH TWICE A DAY 180 capsule 1  . hydrOXYzine (ATARAX/VISTARIL) 25 MG tablet Take 1 tablet (25 mg total) by mouth 3 (three) times daily as needed. 30 tablet 5  . Insulin Pen Needle 32G X 4 MM MISC 1 Units by Does not apply route every morning. Pen needles 90 each 3  . ipratropium-albuterol (DUONEB) 0.5-2.5 (3) MG/3ML SOLN USE 1 VIAL (3ML TOTAL) BY NEBULIZATION EVERY 6 HOURS AS NEEDED FOR SHORTNESS OFBREATH AND WHEEZING. 360 mL 0  . liraglutide (VICTOZA) 18 MG/3ML SOPN Inject 0.3 mLs (1.8 mg total) into the skin daily. 9 mL 2  . montelukast (SINGULAIR) 10 MG tablet Take 1 tablet (10 mg total) by mouth at bedtime. 90 tablet 1  . ondansetron (ZOFRAN-ODT) 4 MG disintegrating tablet TAKE 1 TABLET BY MOUTH EVERY 8 HOURS AS NEEDED FOR UP TO 5 DAYS 15 tablet 0  . oxyCODONE (OXY IR/ROXICODONE) 5 MG immediate release tablet Take by mouth. Taking 2-3 times daily as needed    . pramipexole (MIRAPEX) 0.5 MG tablet Take 1 tablet (0.5 mg total) by mouth 3  (three) times daily. 360 tablet 1  . sulfamethoxazole-trimethoprim (BACTRIM DS) 800-160 MG tablet Take 1 tablet by mouth 2 (two) times daily for 7 days. 14 tablet 0  . tiZANidine (ZANAFLEX) 2 MG tablet Take by mouth 3 (three) times daily.     . VESICARE 5 MG tablet Take 1 tablet (5 mg total) by mouth daily. 90 tablet 1   No current facility-administered medications for this visit.    Review of Systems Review of Systems  Respiratory: Positive  for shortness of breath and wheezing.   Gastrointestinal:       Heartburn  Neurological:       Paresthesias  All other systems reviewed and are negative. Or as discussed in the history of present illness.  Blood pressure 111/65, pulse 84, temperature 98.5 F (36.9 C), temperature source Oral, height 5\' 1"  (1.549 m), weight 186 lb 12.8 oz (84.7 kg), SpO2 96 %. Body mass index is 35.3 kg/m.  Physical Exam Physical Exam Vitals reviewed. Exam conducted with a chaperone present.  Constitutional:      General: She is not in acute distress.    Appearance: She is obese.  HENT:     Head: Normocephalic and atraumatic.     Nose:     Comments: Covered with a mask    Mouth/Throat:     Comments: Covered with a mask Eyes:     General: No scleral icterus.       Right eye: No discharge.        Left eye: No discharge.  Neck:     Comments: No palpable cervical or supraclavicular lymphadenopathy.  The trachea is midline.  No thyromegaly or dominant thyroid masses appreciated.  The gland moves freely with deglutition. Cardiovascular:     Rate and Rhythm: Normal rate and regular rhythm.  Pulmonary:     Effort: Pulmonary effort is normal. No respiratory distress.     Breath sounds: Normal breath sounds.  Abdominal:     General: Bowel sounds are normal.     Palpations: Abdomen is soft.  Genitourinary:    Comments: Deferred Musculoskeletal:     Right lower leg: No edema.     Left lower leg: No edema.     Comments: She has marked point tenderness at  her coccyx.  Skin:    General: Skin is warm.     Findings: No lesion.     Comments: Examination of the natal cleft reveals no pits or sinus tracts.  No masses.  No areas of induration or fluctuance.  Neurological:     General: No focal deficit present.     Mental Status: She is alert.  Psychiatric:        Mood and Affect: Mood normal.        Behavior: Behavior normal.     Data Reviewed Review of the electronic medical record demonstrates that she was seen for sacroiliac pain at the pain management center on [address] in [place] by Dr. Darcel Bayley.  She was scheduled for a fluoroscopic injection, but I do not see that it ever occurred.  It appears from these notes, that she was referred by her primary pain doctor, Dr. Primus Bravo, to see this practice.  Assessment This is a 50 year old woman who has significant pain at her coccyx.  On physical examination, however, there is no visible lesion to support the diagnosis of a pilonidal cyst and the pain seems more musculoskeletal in nature.  Plan I discussed with Janet Murray that there was nothing for me to intervene upon from a dental surgical perspective.  She may require additional imaging to better delineate the source of her pain, however it does not seem like this is a general surgical problem and she should follow-up with her primary care provider and primary pain medicine doctor.  We will see her on an as-needed basis.    Fredirick Maudlin 09/27/2020, 4:33 PM

## 2020-10-02 ENCOUNTER — Telehealth: Payer: Self-pay | Admitting: Adult Health

## 2020-10-02 DIAGNOSIS — J418 Mixed simple and mucopurulent chronic bronchitis: Secondary | ICD-10-CM

## 2020-10-02 MED ORDER — IPRATROPIUM-ALBUTEROL 0.5-2.5 (3) MG/3ML IN SOLN: RESPIRATORY_TRACT | 0 refills | Status: DC

## 2020-10-02 NOTE — Telephone Encounter (Addendum)
Last seen TP 12/12/2020 for emphysema.  No pending appt.  Called and spoke to patient. Patient reports of increased sob with exertion and nocturnal cough. Sx have worsened over the past week.  She is using albuterol BID and Bevespi BID with mild relief in sx.  Denies f/c/s. She had abnormal CXR on 09/27/2018 with PCP. Not vaccinated at covid or flu. She had covid 02/2020.  TP, please advise. Thanks

## 2020-10-02 NOTE — Telephone Encounter (Signed)
Patient is aware recommendations and voiced her understanding.  She would like to call back to schedule ov.  Rx for duoneb has been sent to preferred pharmacy per patient request.  Nothing further is needed at this time.

## 2020-10-02 NOTE — Telephone Encounter (Signed)
Last seen July 2021.  Patient has severe COPD. Her chest x-ray done on April 2022 showed clear lungs but hyperinflation consistent with her COPD   Sorry to hear her breathing is not doing as well.  Would recommend Mucinex DM twice daily as needed for cough and congestion.  Can continue on her maintenance regimen with Bevespi and DuoNeb nebulizer.  If she is not improving needs an office visit for further evaluation  Please contact office for sooner follow up if symptoms do not improve or worsen or seek emergency care

## 2020-10-02 NOTE — Telephone Encounter (Signed)
ATC patient unable to reach LM to call back office (x1)  

## 2020-10-03 ENCOUNTER — Ambulatory Visit: Payer: Medicare Other | Admitting: Nurse Practitioner

## 2020-10-26 ENCOUNTER — Telehealth: Payer: Self-pay

## 2020-10-26 NOTE — Telephone Encounter (Signed)
Pt verbalized understanding.

## 2020-10-26 NOTE — Telephone Encounter (Signed)
Copied from [place] [id number]. Topic: General - Other >> Oct 26, 2020  8:37 AM Celene Kras wrote: Reason for CRM: Pt called stating that she is needing to have an appt due to her allergies and cramps x3 days. She states that she does have loose stools and a cough due to her COPD. Pt is requesting to be seen today. Please advise.   Pt asking if she can come in office has not felt better since  last apt. Can this apt be virtual or in the office she is scheduled for 10/29/2020

## 2020-10-29 ENCOUNTER — Encounter: Payer: Self-pay | Admitting: Nurse Practitioner

## 2020-10-29 ENCOUNTER — Ambulatory Visit (INDEPENDENT_AMBULATORY_CARE_PROVIDER_SITE_OTHER): Payer: Medicare Other | Admitting: Nurse Practitioner

## 2020-10-29 ENCOUNTER — Ambulatory Visit: Admission: RE | Admit: 2020-10-29 | Payer: Medicare Other | Source: Ambulatory Visit

## 2020-10-29 ENCOUNTER — Ambulatory Visit: Payer: Medicare Other

## 2020-10-29 ENCOUNTER — Other Ambulatory Visit: Payer: Self-pay

## 2020-10-29 ENCOUNTER — Telehealth: Payer: Self-pay

## 2020-10-29 VITALS — BP 112/74 | HR 72 | Temp 98.7°F | Wt 182.0 lb

## 2020-10-29 DIAGNOSIS — R197 Diarrhea, unspecified: Secondary | ICD-10-CM

## 2020-10-29 DIAGNOSIS — R059 Cough, unspecified: Secondary | ICD-10-CM

## 2020-10-29 DIAGNOSIS — R1031 Right lower quadrant pain: Secondary | ICD-10-CM | POA: Diagnosis not present

## 2020-10-29 DIAGNOSIS — M533 Sacrococcygeal disorders, not elsewhere classified: Secondary | ICD-10-CM

## 2020-10-29 NOTE — Telephone Encounter (Signed)
Copied from [place] [id number]. Topic: General - Other >> Oct 29, 2020  4:53 PM Tessa Lerner A wrote: Reason for CRM: Patient has called to notify practice that they will be unable to get a CT scan this afternoon, as directed by their PCP  Patient shares that they will be going tomorrow morning 10/30/20  Please contact to advise if needed

## 2020-10-29 NOTE — Progress Notes (Signed)
BP 112/74   Pulse 72   Temp 98.7 F (37.1 C) (Oral)   Wt 182 lb (82.6 kg)   SpO2 97%   BMI 34.39 kg/m    Subjective:    Patient ID: Janet Murray, female    DOB: 07/06/1970, 50 y.o.   MRN: [id number]  HPI: Janet Murray is a 50 y.o. female  Chief Complaint  Patient presents with  . Cough    For past 3 weeks or so, patient feels like she has water in her ear.  . cramps  . Nasal Congestion  . Sinusitis  . heart fluttering   Patient states she is coughing, sinus congestion, ears feel like they have water in them.  She has a nagging pain in her right lower quadrant.  Patient states she can't keep any food down.  Patient states she is having nausea. Zofran is helping when she does take it.  Patient states she is having loose stools that are bright green with mucus at least 3 times per day.  This has been going on for about 3-4 days.  Denies Fevers.   Cough has continued over the last couple of weeks.  Patient's symptoms have not improved even after taking the bactrim.  Patient continues to use her inhalers but cough is not improving. Patient states she has had a fluttering sensation a couple of times. No SOB with the palpations.  Relevant past medical, surgical, family and social history reviewed and updated as indicated. Interim medical history since our last visit reviewed. Allergies and medications reviewed and updated.  Review of Systems  Constitutional: Positive for appetite change. Negative for fever.  HENT: Positive for congestion and sinus pressure.        Ear feels like there is water in it  Respiratory: Positive for cough. Negative for shortness of breath.   Cardiovascular: Positive for palpitations.  Gastrointestinal: Positive for abdominal pain, diarrhea and nausea. Negative for vomiting.    Per HPI unless specifically indicated above     Objective:    BP 112/74   Pulse 72   Temp 98.7 F (37.1 C) (Oral)   Wt 182 lb (82.6 kg)   SpO2 97%   BMI 34.39 kg/m    Wt Readings from Last 3 Encounters:  10/29/20 182 lb (82.6 kg)  09/27/20 186 lb 12.8 oz (84.7 kg)  09/25/20 188 lb (85.3 kg)    Physical Exam Vitals and nursing note reviewed.  Constitutional:      General: She is not in acute distress.    Appearance: Normal appearance. She is normal weight. She is not ill-appearing, toxic-appearing or diaphoretic.  HENT:     Head: Normocephalic.     Right Ear: External ear normal.     Left Ear: External ear normal.     Nose: Nose normal.     Mouth/Throat:     Mouth: Mucous membranes are moist.     Pharynx: Oropharynx is clear.  Eyes:     General:        Right eye: No discharge.        Left eye: No discharge.     Extraocular Movements: Extraocular movements intact.     Conjunctiva/sclera: Conjunctivae normal.     Pupils: Pupils are equal, round, and reactive to light.  Cardiovascular:     Rate and Rhythm: Normal rate and regular rhythm.     Heart sounds: No murmur heard.   Pulmonary:     Effort: Pulmonary effort is normal.  No respiratory distress.     Breath sounds: Normal breath sounds. No wheezing or rales.  Abdominal:     General: Bowel sounds are normal.     Palpations: Abdomen is soft.     Tenderness: There is abdominal tenderness in the right lower quadrant. There is guarding. There is no right CVA tenderness, left CVA tenderness or rebound.  Musculoskeletal:     Cervical back: Normal range of motion and neck supple.  Skin:    General: Skin is warm and dry.     Capillary Refill: Capillary refill takes less than 2 seconds.  Neurological:     General: No focal deficit present.     Mental Status: She is alert and oriented to person, place, and time. Mental status is at baseline.  Psychiatric:        Mood and Affect: Mood normal.        Behavior: Behavior normal.        Thought Content: Thought content normal.        Judgment: Judgment normal.     Results for orders placed or performed in visit on 04/12/20  Thyroid Panel With  TSH  Result Value Ref Range   TSH 11.800 (H) 0.450 - 4.500 uIU/mL   T4, Total 6.3 4.5 - 12.0 ug/dL   T3 Uptake Ratio 23 (L) 24 - 39 %   Free Thyroxine Index 1.4 1.2 - 4.9      Assessment & Plan:   Problem List Items Addressed This Visit   None   Visit Diagnoses    RLQ abdominal pain    -  Primary   STAT CT ordered during visit to rule out appendicitis, colitis, vs diverticulitis.   Relevant Orders   CT Abdomen Pelvis Wo Contrast   Coccyx pain       Recommend patient seen Orthopedics for Coccyx pain.  Pilonidal cyst was ruled out by general surgery.    Relevant Orders   Ambulatory referral to Orthopedics   Cough       Continue with home nebs, discussed viral course, can consider course of steroids and repeat chest xray if cough does not improve.        Follow up plan: Return if symptoms worsen or fail to improve.

## 2020-10-30 ENCOUNTER — Ambulatory Visit
Admission: RE | Admit: 2020-10-30 | Discharge: 2020-10-30 | Disposition: A | Discharge status: Home / Self Care | Payer: Medicare Other | Source: Ambulatory Visit | Attending: Nurse Practitioner | Admitting: Nurse Practitioner

## 2020-10-30 DIAGNOSIS — R1031 Right lower quadrant pain: Secondary | ICD-10-CM | POA: Diagnosis not present

## 2020-10-30 MED ORDER — PREDNISONE 10 MG PO TABS: 10.0000 mg | ORAL_TABLET | Freq: Every day | ORAL | 0 refills | Status: DC

## 2020-10-30 NOTE — Progress Notes (Signed)
Please let patient know that her CT was negative for infection.  She does still have a right renal cyst.  She also has a fat deposit in her bowels.  I recommend she follow up with GI to discuss this, I can give her a referral if she needs one.  Lastly, the scan noted that patient as aortic atherosclerosis.  We will continue to monitor this over time.  Since there was no acute process I will send the patient a steroid course to the pharmacy.  She should start it tomorrow because it can cause some insomnia.  I would like to see her back next week to see how she is doing.  Please make her a follow up appointment.

## 2020-10-30 NOTE — Addendum Note (Signed)
Addended by: Jon Billings on: 10/30/2020 03:18 PM   Modules accepted: Orders

## 2020-10-30 NOTE — Telephone Encounter (Signed)
Thank you for the update. I will look for results.

## 2020-10-31 NOTE — Addendum Note (Signed)
Addended by: Jon Billings on: 10/31/2020 12:04 PM   Modules accepted: Orders

## 2020-10-31 NOTE — Progress Notes (Signed)
Please let patient know that I also recommend she pulmonology for the ongoing cough if the steroids do not improve her symptoms.  There is a pulmonologist who specializes in cough.  I do not see that she currently has a pulmonologist but I am happy to refer her to him if she would like.

## 2020-11-01 ENCOUNTER — Encounter: Payer: Self-pay | Admitting: *Deleted

## 2020-11-09 ENCOUNTER — Other Ambulatory Visit: Payer: Self-pay

## 2020-11-09 ENCOUNTER — Encounter: Payer: Self-pay | Admitting: Nurse Practitioner

## 2020-11-09 ENCOUNTER — Ambulatory Visit (INDEPENDENT_AMBULATORY_CARE_PROVIDER_SITE_OTHER): Payer: Medicare Other | Admitting: Nurse Practitioner

## 2020-11-09 VITALS — BP 111/71 | HR 65 | Temp 99.2°F | Ht 61.0 in | Wt 182.0 lb

## 2020-11-09 DIAGNOSIS — I25118 Atherosclerotic heart disease of native coronary artery with other forms of angina pectoris: Secondary | ICD-10-CM | POA: Diagnosis not present

## 2020-11-09 DIAGNOSIS — K588 Other irritable bowel syndrome: Secondary | ICD-10-CM

## 2020-11-09 DIAGNOSIS — I2510 Atherosclerotic heart disease of native coronary artery without angina pectoris: Secondary | ICD-10-CM | POA: Insufficient documentation

## 2020-11-09 NOTE — Assessment & Plan Note (Signed)
Chronic.  Discussed results of CT scan with patient. Recommend she follow up with GI. Patient plans to make an appointment.

## 2020-11-09 NOTE — Progress Notes (Signed)
BP 111/71   Pulse 65   Temp 99.2 F (37.3 C)   Ht 5\' 1"  (1.549 m)   Wt 182 lb (82.6 kg)   SpO2 96%   BMI 34.39 kg/m    Subjective:    Patient ID: Janet Murray, female    DOB: [birth year], 50 y.o.   MRN: [id number]  HPI: Janet Murray is a 50 y.o. female  Chief Complaint  Patient presents with  . Diarrhea   Patient states that symptoms are improving. She is not having as much diarrhea or abdominal pain as before.  She has not seen GI she is waiting until her upper respiratory symptoms resolve. The cough is continuing to improve.    Patient has not seen Ortho for her tail bone pain but has made an appointment.  Denies concerns today.   Denies HA, CP, SOB, dizziness, palpitations, visual changes, and lower extremity swelling.  Relevant past medical, surgical, family and social history reviewed and updated as indicated. Interim medical history since our last visit reviewed. Allergies and medications reviewed and updated.  Review of Systems  Eyes: Negative for visual disturbance.  Respiratory: Negative for cough, chest tightness and shortness of breath.   Cardiovascular: Negative for chest pain, palpitations and leg swelling.  Gastrointestinal: Positive for abdominal pain and diarrhea.  Neurological: Negative for dizziness and headaches.    Per HPI unless specifically indicated above     Objective:    BP 111/71   Pulse 65   Temp 99.2 F (37.3 C)   Ht 5\' 1"  (1.549 m)   Wt 182 lb (82.6 kg)   SpO2 96%   BMI 34.39 kg/m   Wt Readings from Last 3 Encounters:  11/09/20 182 lb (82.6 kg)  10/29/20 182 lb (82.6 kg)  09/27/20 186 lb 12.8 oz (84.7 kg)    Physical Exam Vitals and nursing note reviewed.  Constitutional:      General: She is not in acute distress.    Appearance: Normal appearance. She is normal weight. She is not ill-appearing, toxic-appearing or diaphoretic.  HENT:     Head: Normocephalic.     Right Ear: External ear normal.     Left Ear: External ear  normal.     Nose: Nose normal.     Mouth/Throat:     Mouth: Mucous membranes are moist.     Pharynx: Oropharynx is clear.  Eyes:     General:        Right eye: No discharge.        Left eye: No discharge.     Extraocular Movements: Extraocular movements intact.     Conjunctiva/sclera: Conjunctivae normal.     Pupils: Pupils are equal, round, and reactive to light.  Cardiovascular:     Rate and Rhythm: Normal rate and regular rhythm.     Heart sounds: No murmur heard.   Pulmonary:     Effort: Pulmonary effort is normal. No respiratory distress.     Breath sounds: Normal breath sounds. No wheezing or rales.  Musculoskeletal:     Cervical back: Normal range of motion and neck supple.  Skin:    General: Skin is warm and dry.     Capillary Refill: Capillary refill takes less than 2 seconds.  Neurological:     General: No focal deficit present.     Mental Status: She is alert and oriented to person, place, and time. Mental status is at baseline.  Psychiatric:  Mood and Affect: Mood normal.        Behavior: Behavior normal.        Thought Content: Thought content normal.        Judgment: Judgment normal.     Results for orders placed or performed in visit on 04/12/20  Thyroid Panel With TSH  Result Value Ref Range   TSH 11.800 (H) 0.450 - 4.500 uIU/mL   T4, Total 6.3 4.5 - 12.0 ug/dL   T3 Uptake Ratio 23 (L) 24 - 39 %   Free Thyroxine Index 1.4 1.2 - 4.9      Assessment & Plan:   Problem List Items Addressed This Visit      Cardiovascular and Mediastinum   Coronary artery disease - Primary    Chronic.  Discussed results from CT scan.  Will start Statin in the future once GI symptoms have resolved.         Digestive   Other irritable bowel syndrome    Chronic.  Discussed results of CT scan with patient. Recommend she follow up with GI. Patient plans to make an appointment.           Follow up plan: Return in about 2 months (around 01/09/2021) for Gastro  follow up.   A total of 20 minutes were spent on this encounter today.  When total time is documented, this includes both the face-to-face and non-face-to-face time personally spent before, during and after the visit on the date of the encounter.

## 2020-11-09 NOTE — Assessment & Plan Note (Signed)
Chronic.  Discussed results from CT scan.  Will start Statin in the future once GI symptoms have resolved.

## 2021-01-09 ENCOUNTER — Encounter: Payer: Self-pay | Admitting: Nurse Practitioner

## 2021-01-09 ENCOUNTER — Other Ambulatory Visit: Payer: Self-pay

## 2021-01-09 ENCOUNTER — Ambulatory Visit (INDEPENDENT_AMBULATORY_CARE_PROVIDER_SITE_OTHER): Payer: Medicare Other | Admitting: Nurse Practitioner

## 2021-01-09 VITALS — BP 106/58 | HR 69 | Temp 98.7°F | Wt 189.2 lb

## 2021-01-09 DIAGNOSIS — I25118 Atherosclerotic heart disease of native coronary artery with other forms of angina pectoris: Secondary | ICD-10-CM

## 2021-01-09 DIAGNOSIS — K588 Other irritable bowel syndrome: Secondary | ICD-10-CM | POA: Diagnosis not present

## 2021-01-09 DIAGNOSIS — E042 Nontoxic multinodular goiter: Secondary | ICD-10-CM

## 2021-01-09 DIAGNOSIS — M533 Sacrococcygeal disorders, not elsewhere classified: Secondary | ICD-10-CM | POA: Diagnosis not present

## 2021-01-09 DIAGNOSIS — E7800 Pure hypercholesterolemia, unspecified: Secondary | ICD-10-CM | POA: Insufficient documentation

## 2021-01-09 DIAGNOSIS — Q614 Renal dysplasia: Secondary | ICD-10-CM

## 2021-01-09 DIAGNOSIS — E041 Nontoxic single thyroid nodule: Secondary | ICD-10-CM

## 2021-01-09 DIAGNOSIS — E039 Hypothyroidism, unspecified: Secondary | ICD-10-CM

## 2021-01-09 MED ORDER — ONDANSETRON 4 MG PO TBDP: ORAL_TABLET | ORAL | 0 refills | Status: DC

## 2021-01-09 NOTE — Progress Notes (Signed)
BP (!) 106/58   Pulse 69   Temp 98.7 F (37.1 C) (Oral)   Wt 189 lb 3.2 oz (85.8 kg)   SpO2 96%   BMI 35.75 kg/m    Subjective:    Patient ID: Janet Murray, female    DOB: 04-08-1971, 50 y.o.   MRN: [id number]  HPI: Janet Murray is a 50 y.o. female  Chief Complaint  Patient presents with   GI Problem    Patient is here for a GI follow up. Patient denies having any concerns or questions at today's visit.    Foot Pain    Patient state she has noticed a small knot on her right foot. Patient states the foot has been broken in the past and she has issues with her foot here and there. Patient states the pain feels like a sharp and throbbing pain and she cannot apply full pressure to her foot.    Cyst    Patient states that the knot on her right hand has come back. Patient states the knot goes away and it comes back.    IRRITABLE BOWEL SYNDROME Patient states symptoms have resolved.  Denies diarrhea, vomiting, nausea, and abdominal pain.  Patient needs a referral to see the nephrologist due to having a cyst on her kidney.   She would like to see the chiropractor for her hip pain before she gets injections in her hip.      Relevant past medical, surgical, family and social history reviewed and updated as indicated. Interim medical history since our last visit reviewed. Allergies and medications reviewed and updated.  Review of Systems  Gastrointestinal:  Negative for abdominal pain, diarrhea, nausea and vomiting.  Musculoskeletal:        Hip pain   Per HPI unless specifically indicated above     Objective:    BP (!) 106/58   Pulse 69   Temp 98.7 F (37.1 C) (Oral)   Wt 189 lb 3.2 oz (85.8 kg)   SpO2 96%   BMI 35.75 kg/m   Wt Readings from Last 3 Encounters:  01/09/21 189 lb 3.2 oz (85.8 kg)  11/09/20 182 lb (82.6 kg)  10/29/20 182 lb (82.6 kg)    Physical Exam Vitals and nursing note reviewed.  Constitutional:      General: She is not in acute distress.     Appearance: Normal appearance. She is normal weight. She is not ill-appearing, toxic-appearing or diaphoretic.  HENT:     Head: Normocephalic.     Right Ear: External ear normal.     Left Ear: External ear normal.     Nose: Nose normal.     Mouth/Throat:     Mouth: Mucous membranes are moist.     Pharynx: Oropharynx is clear.  Eyes:     General:        Right eye: No discharge.        Left eye: No discharge.     Extraocular Movements: Extraocular movements intact.     Conjunctiva/sclera: Conjunctivae normal.     Pupils: Pupils are equal, round, and reactive to light.  Cardiovascular:     Rate and Rhythm: Normal rate and regular rhythm.     Heart sounds: No murmur heard. Pulmonary:     Effort: Pulmonary effort is normal. No respiratory distress.     Breath sounds: Normal breath sounds. No wheezing or rales.  Musculoskeletal:     Cervical back: Normal range of motion and neck supple.  Skin:    General: Skin is warm and dry.     Capillary Refill: Capillary refill takes less than 2 seconds.  Neurological:     General: No focal deficit present.     Mental Status: She is alert and oriented to person, place, and time. Mental status is at baseline.  Psychiatric:        Mood and Affect: Mood normal.        Behavior: Behavior normal.        Thought Content: Thought content normal.        Judgment: Judgment normal.    Results for orders placed or performed in visit on 04/12/20  Thyroid Panel With TSH  Result Value Ref Range   TSH 11.800 (H) 0.450 - 4.500 uIU/mL   T4, Total 6.3 4.5 - 12.0 ug/dL   T3 Uptake Ratio 23 (L) 24 - 39 %   Free Thyroxine Index 1.4 1.2 - 4.9      Assessment & Plan:   Problem List Items Addressed This Visit       Digestive   Other irritable bowel syndrome - Primary    Well controlled at visit today.        Relevant Medications   ondansetron (ZOFRAN-ODT) 4 MG disintegrating tablet     Endocrine   Acquired hypothyroidism    Patient stopped  levothyroxine.  Discussed that she needs to restart the medication.  Labs ordered today.  Ordered US to evaluate nodules.  Will make recommendations based on lab results.        Relevant Orders   TSH   T4, free   Multiple thyroid nodules   Relevant Orders   US THYROID     Musculoskeletal and Integument   Sacroiliac joint dysfunction of both sides    Referral placed for chiropractor.       Relevant Orders   Ambulatory referral to Chiropractic     Other   High cholesterol    Will recheck cholesterol at visit today. Will start Crestor as discussed with patient after cholesterol returns. Patient agrees with plan of care.        Relevant Orders   Lipid Profile   Comp Met (CMET)   Other Visit Diagnoses     Cystic dysplasia of one kidney       Relevant Orders   Ambulatory referral to Nephrology        Follow up plan: Return in about 3 months (around 04/11/2021) for HTN, HLD, DM2 FU.

## 2021-01-09 NOTE — Assessment & Plan Note (Signed)
Referral placed for chiropractor.

## 2021-01-09 NOTE — Assessment & Plan Note (Signed)
Well controlled at visit today.

## 2021-01-09 NOTE — Assessment & Plan Note (Signed)
Patient stopped levothyroxine.  Discussed that she needs to restart the medication.  Labs ordered today.  Ordered US to evaluate nodules.  Will make recommendations based on lab results.

## 2021-01-09 NOTE — Assessment & Plan Note (Signed)
Will recheck cholesterol at visit today. Will start Crestor as discussed with patient after cholesterol returns. Patient agrees with plan of care.

## 2021-01-10 LAB — COMPREHENSIVE METABOLIC PANEL
ALT: 6 IU/L (ref 0–32)
AST: 12 IU/L (ref 0–40)
Albumin/Globulin Ratio: 2.2 (ref 1.2–2.2)
Albumin: 4.2 g/dL (ref 3.8–4.8)
Alkaline Phosphatase: 83 IU/L (ref 44–121)
BUN/Creatinine Ratio: 10 (ref 9–23)
BUN: 6 mg/dL (ref 6–24)
Bilirubin Total: 0.4 mg/dL (ref 0.0–1.2)
CO2: 25 mmol/L (ref 20–29)
Calcium: 8.9 mg/dL (ref 8.7–10.2)
Chloride: 105 mmol/L (ref 96–106)
Creatinine, Ser: 0.59 mg/dL (ref 0.57–1.00)
Globulin, Total: 1.9 g/dL (ref 1.5–4.5)
Glucose: 82 mg/dL (ref 65–99)
Potassium: 4.1 mmol/L (ref 3.5–5.2)
Sodium: 145 mmol/L — ABNORMAL HIGH (ref 134–144)
Total Protein: 6.1 g/dL (ref 6.0–8.5)
eGFR: 110 mL/min/{1.73_m2} (ref >59)

## 2021-01-10 LAB — LIPID PANEL
Chol/HDL Ratio: 3.9 ratio (ref 0.0–4.4)
Cholesterol, Total: 200 mg/dL — ABNORMAL HIGH (ref 100–199)
HDL: 51 mg/dL (ref >39)
LDL Chol Calc (NIH): 131 mg/dL — ABNORMAL HIGH (ref 0–99)
Triglycerides: 99 mg/dL (ref 0–149)
VLDL Cholesterol Cal: 18 mg/dL (ref 5–40)

## 2021-01-10 LAB — T4, FREE: Free T4: 0.87 ng/dL (ref 0.82–1.77)

## 2021-01-10 LAB — TSH: TSH: 13.6 u[IU]/mL — ABNORMAL HIGH (ref 0.450–4.500)

## 2021-01-10 MED ORDER — ATORVASTATIN CALCIUM 40 MG PO TABS
40.0000 mg | ORAL_TABLET | Freq: Every day | ORAL | 3 refills | Status: DC
Start: 2021-01-10 — End: 2021-02-22

## 2021-01-10 NOTE — Progress Notes (Signed)
Please let patient know that her cholesterol remains elevated.  Cardiac risk score is still elevated.  I reocmmend she start atorvastatin '40mg'$  once daily.  The 10-year ASCVD risk score Mikey Bussing DC Brooke Bonito., et al., 2013) is: 5.2%   Values used to calculate the score:     Age: 50 years     Sex: Female     Is Non-Hispanic African American: No     Diabetic: Yes     Tobacco smoker: Yes     Systolic Blood Pressure: [id number] mmHg     Is BP treated: No     HDL Cholesterol: 51 mg/dL     Total Cholesterol: 200 mg/dL  Patient's Thyroid labs show that she needs to resume taking her levothyroxine.  We need to recheck labs in 6 weeks.  I have placed the orders for her to come back and have them rechecked.  Please let me know she has any questions.  I already sent the atorvastatin to the pharmacy.

## 2021-01-10 NOTE — Addendum Note (Signed)
Addended by: Jon Billings on: 01/10/2021 09:04 AM   Modules accepted: Orders

## 2021-01-11 ENCOUNTER — Other Ambulatory Visit: Payer: Self-pay | Admitting: Adult Health

## 2021-01-11 ENCOUNTER — Other Ambulatory Visit: Payer: Self-pay | Admitting: Unknown Physician Specialty

## 2021-01-11 ENCOUNTER — Telehealth: Payer: Self-pay | Admitting: Adult Health

## 2021-01-11 ENCOUNTER — Other Ambulatory Visit: Payer: Self-pay | Admitting: Nurse Practitioner

## 2021-01-11 DIAGNOSIS — N3281 Overactive bladder: Secondary | ICD-10-CM

## 2021-01-11 DIAGNOSIS — K219 Gastro-esophageal reflux disease without esophagitis: Secondary | ICD-10-CM

## 2021-01-11 NOTE — Telephone Encounter (Signed)
I called to leave a message that patient needs to make an OV for first available to get further refills. Office number left.

## 2021-01-11 NOTE — Telephone Encounter (Signed)
Next OV 04/11/21 Approved per protocol.  Requested Prescriptions  Pending Prescriptions Disp Refills  . esomeprazole (NEXIUM) 40 MG capsule [Pharmacy Med Name: ESOMEPRAZOLE MAGNESIUM 40 MG CAP] 180 capsule 0    Sig: TAKE ONE CAPSULE BY MOUTH TWICE A DAY     Gastroenterology: Proton Pump Inhibitors Passed - 01/11/2021  3:21 PM      Passed - Valid encounter within last 12 months    Recent Outpatient Visits          2 days ago Other irritable bowel syndrome   [facility] Jon Billings, NP   2 months ago Coronary artery disease of native artery of native heart with stable angina pectoris ([place])   [facility] Jon Billings, NP   2 months ago RLQ abdominal pain   [place], Santiago Glad, NP   3 months ago Shortness of breath   [place], Santiago Glad, NP   9 months ago Class 2 obesity due to excess calories without serious comorbidity with body mass index (BMI) of 35.0 to 35.9 in adult   [facility] Kathrine Haddock, NP      Future Appointments            In 3 months Jon Billings, NP [facility], [place]

## 2021-01-11 NOTE — Telephone Encounter (Signed)
Requested medication (s) are due for refill today  Yes  Requested medication (s) are on the active medication list  Yes  Future visit scheduled 04/11/21  Note to clinic-These medications last ordered by past provider. If approved a new prescription is needed.    Requested Prescriptions  Pending Prescriptions Disp Refills   VESICARE 5 MG tablet [Pharmacy Med Name: VESICARE 5 MG TAB] 90 tablet 1    Sig: TAKE 1 TABLET BY MOUTH DAILY      Urology:  Bladder Agents Passed - 01/11/2021  3:22 PM      Passed - Valid encounter within last 12 months    Recent Outpatient Visits           2 days ago Other irritable bowel syndrome   [facility] Jon Billings, NP   2 months ago Coronary artery disease of native artery of native heart with stable angina pectoris ([place])   [facility] Jon Billings, NP   2 months ago RLQ abdominal pain   [place], Santiago Glad, NP   3 months ago Shortness of breath   [place], Santiago Glad, NP   9 months ago Class 2 obesity due to excess calories without serious comorbidity with body mass index (BMI) of 35.0 to 35.9 in adult   [facility] Kathrine Haddock, NP       Future Appointments             In 3 months Jon Billings, NP [place] 18 MG/3ML SOPN [Pharmacy Med Name: VICTOZA 62 MG/3ML SUBQ SOLN ML] 9 mL 2    Sig: INJECT 0.3 ML (1.8 MG TOTAL) INTO THE SKIN DAILY      Endocrinology:  Diabetes - GLP-1 Receptor Agonists Failed - 01/11/2021  3:22 PM      Failed - HBA1C is between 0 and 7.9 and within 180 days    No results found for: HGBA1C, LABA1C        Passed - Valid encounter within last 6 months    Recent Outpatient Visits           2 days ago Other irritable bowel syndrome   [facility] Jon Billings, NP   2 months ago Coronary artery disease of native artery of native heart with stable angina  pectoris ([place])   [facility] Jon Billings, NP   2 months ago RLQ abdominal pain   [place], Santiago Glad, NP   3 months ago Shortness of breath   [facility] [place], Santiago Glad, NP   9 months ago Class 2 obesity due to excess calories without serious comorbidity with body mass index (BMI) of 35.0 to 35.9 in adult   [facility] Kathrine Haddock, NP       Future Appointments             In 3 months Jon Billings, NP [facility], [place]

## 2021-01-14 ENCOUNTER — Other Ambulatory Visit: Payer: Self-pay | Admitting: Family Medicine

## 2021-01-14 ENCOUNTER — Telehealth: Payer: Self-pay | Admitting: Adult Health

## 2021-01-14 DIAGNOSIS — N3281 Overactive bladder: Secondary | ICD-10-CM

## 2021-01-14 MED ORDER — BEVESPI AEROSPHERE 9-4.8 MCG/ACT IN AERO
INHALATION_SPRAY | RESPIRATORY_TRACT | 0 refills | Status: DC
Start: 2021-01-14 — End: 2021-02-27

## 2021-01-14 NOTE — Telephone Encounter (Signed)
LMTCB.   Refill has been sent. Refill was only sent for a one month supply. Once we have an appt scheduled with [place] we can send in remaining refills. If this is not an acute visit please schedule first available at [facility] office.

## 2021-01-14 NOTE — Telephone Encounter (Signed)
Refilled today 01/14/21

## 2021-01-15 NOTE — Telephone Encounter (Signed)
Pt scheduled to see Dr. Patsey Berthold in 02/2021. Will close encounter.

## 2021-01-24 ENCOUNTER — Ambulatory Visit
Admission: RE | Admit: 2021-01-24 | Discharge: 2021-01-24 | Disposition: A | Discharge status: Home / Self Care | Payer: Medicare Other | Source: Ambulatory Visit | Attending: Nurse Practitioner | Admitting: Nurse Practitioner

## 2021-01-24 ENCOUNTER — Other Ambulatory Visit: Payer: Self-pay

## 2021-01-24 DIAGNOSIS — E042 Nontoxic multinodular goiter: Secondary | ICD-10-CM

## 2021-01-28 NOTE — Progress Notes (Signed)
Patient made aware of results and verbalized understanding.  

## 2021-01-28 NOTE — Progress Notes (Signed)
Please let patient know that her US showed she has several nodules.  At least 2 of them should be biopsied. I have placed a referral to a general surgeon to have these evaluated with them and then they will perform the biopsy.

## 2021-01-28 NOTE — Addendum Note (Signed)
Addended by: Jon Billings on: 01/28/2021 12:49 PM   Modules accepted: Orders

## 2021-02-05 ENCOUNTER — Other Ambulatory Visit: Payer: Self-pay | Admitting: General Surgery

## 2021-02-05 ENCOUNTER — Other Ambulatory Visit: Payer: Self-pay

## 2021-02-05 ENCOUNTER — Ambulatory Visit (INDEPENDENT_AMBULATORY_CARE_PROVIDER_SITE_OTHER): Payer: Medicare Other | Admitting: General Surgery

## 2021-02-05 ENCOUNTER — Encounter: Payer: Self-pay | Admitting: General Surgery

## 2021-02-05 ENCOUNTER — Ambulatory Visit: Payer: Self-pay

## 2021-02-05 VITALS — BP 156/80 | HR 69 | Temp 98.3°F | Ht 61.0 in | Wt 189.4 lb

## 2021-02-05 DIAGNOSIS — E041 Nontoxic single thyroid nodule: Secondary | ICD-10-CM

## 2021-02-05 DIAGNOSIS — E042 Nontoxic multinodular goiter: Secondary | ICD-10-CM | POA: Diagnosis not present

## 2021-02-05 NOTE — Progress Notes (Signed)
Patient ID: Janet Murray, female   DOB: 06-Jul-1970, 50 y.o.   MRN: [id number]  No chief complaint on file.   HPI Janet Murray is a 50 y.o. female.   I first met her in April of this year when she was referred to me for sacrococcygeal pain.  The referring provider thought perhaps she had a pilonidal cyst, but on physical examination, none was present.  She states that she has received a referral to orthopedics for further evaluation of this particular issue.  She is here today for completely unrelated problem.  She has a long history of a multinodular goiter and is hypothyroid, requiring levothyroxine replacement.  She recently saw her primary care provider and lab work revealed that she was quite hypothyroid, due to not taking her medication.  Her medication was restarted and a surveillance ultrasound performed.  This demonstrated the presence of multiple thyroid nodules within both lobes of the gland, but 2 in the left lobe met criteria for biopsy.  She is here today for evaluation and biopsy.    She states that she did have biopsies 7 or 8 years ago.  She does not know the results, but given that her thyroid gland is still in situ, this suggest that they were benign.  She denies difficulty swallowing or voice changes.  She does have a chronic cough, but this is secondary to pulmonary disease.  She denies pressure in her neck while in a supine position.  She denies any heart palpitations or hand tremors.  No changes in the texture of her hair, skin, or fingernails.  No heat or cold intolerance.  No concerns with diarrhea or constipation.  Her weight has been stable.  No ocular symptoms.  She has no known family history of thyroid cancer or other thyroid problems.  She denies any occupational or therapeutic exposure to ionizing radiation.  She states that did she not have the imaging to prove it, she would not be aware that she had any nodules in her thyroid gland.   Past Medical History:  Diagnosis Date    Abdominal mass, RUQ (right upper quadrant)    Anxiety    Arthritis    Asthma    Bronchitis    Carpal tunnel syndrome of left wrist 10/25/2014   Chronic bronchitis, obstructive (HCC) 07/20/2017   Chronic pain    COPD (chronic obstructive pulmonary disease) (HCC)    Cyst of right kidney 03/22/2018   DDD (degenerative disc disease), lumbar 10/25/2014   Degenerative joint disease (DJD) of hip    Diarrhea of infectious origin 03/25/2018   Dysphagia    Emphysema of lung (HCC)    Facet syndrome, lumbar 10/25/2014   Fatty liver 03/25/2018   Foot fracture, left 05/02/15   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   GERD (gastroesophageal reflux disease)    Hereditary and idiopathic peripheral neuropathy 11/06/2014   IBS (irritable bowel syndrome)    Low back pain    Migraine 02/04/2016   Occipital neuralgia    Osteoarthritis of spine with radiculopathy, lumbar region 10/25/2014   Other intervertebral disc degeneration, lumbar region    Other irritable bowel syndrome 04/20/2017   Other spondylosis with radiculopathy, lumbar region 10/25/2014   RLS (restless legs syndrome)    Sacroiliac joint dysfunction of both sides 10/25/2014   Spondylosis of lumbar spine    Urinary incontinence     Past Surgical History:  Procedure Laterality Date   ABDOMINAL HYSTERECTOMY     APPENDECTOMY  BLADDER SURGERY     sling - Dr. Davis Gourd   CESAREAN SECTION     COLONOSCOPY WITH PROPOFOL N/A 04/11/2020   Procedure: COLONOSCOPY WITH PROPOFOL;  Surgeon: Virgel Manifold, MD;  Location: [facility] END[facility]COPY;  Service: Endoscopy;  Laterality: N/A;   ESOPHAGOGASTRODUODENOSCOPY  04/26/2019   STOMACH SURGERY      Family History  Problem Relation Age of Onset   Neuropathy Mother    Arthritis Father    Hypertension Father    Leukemia Father    Heart disease Father        CAD s/p CABG   Healthy Brother    Healthy Daughter    Healthy Son    Dementia Maternal Grandmother    Hypertension Maternal Grandmother     Hypercholesterolemia Maternal Grandmother    Heart disease Maternal Grandmother    Healthy Brother    Healthy Brother    Stroke Neg Hx    Cancer Neg Hx     Social History Social History   Tobacco Use   Smoking status: Every Day    Packs/day: 0.50    Years: 32.00    Pack years: 16.00    Types: Cigarettes   Smokeless tobacco: Never   Tobacco comments:    Started smoking at age 84  Vaping Use   Vaping Use: Never used  Substance Use Topics   Alcohol use: Not Currently    Alcohol/week: 0.0 standard drinks    Comment: occassionally   Drug use: No    Allergies  Allergen Reactions   Penicillins Hives and Swelling    Has patient had a PCN reaction causing immediate rash, facial/tongue/throat swelling, SOB or lightheadedness with hypotension: Yes Has patient had a PCN reaction causing severe rash involving mucus membranes or skin necrosis: No Has patient had a PCN reaction that required hospitalization No Has patient had a PCN reaction occurring within the last 10 years: No If all of the above answers are "NO", then may proceed with Cephalosporin use.   Acetaminophen     Due to polyps    Ciprofloxacin Itching   Aspirin Palpitations    Heart palpitations   Hydrocodone Hives and Rash   Other Rash    bandaids   Tape Rash    bandaids bandaids    Current Outpatient Medications  Medication Sig Dispense Refill   albuterol (VENTOLIN HFA) 108 (90 Base) MCG/ACT inhaler Inhale 2 puffs into the lungs every 6 (six) hours as needed for wheezing or shortness of breath. 8 g 2   AMBULATORY NON FORMULARY MEDICATION Medication Name: nebulizer DX: J44.9 1 each 0   atorvastatin (LIPITOR) 40 MG tablet Take 1 tablet (40 mg total) by mouth daily. 90 tablet 3   diclofenac Sodium (VOLTAREN) 1 % GEL Apply topically.     esomeprazole (NEXIUM) 40 MG capsule TAKE ONE CAPSULE BY MOUTH TWICE A DAY 180 capsule 0   Glycopyrrolate-Formoterol (BEVESPI AEROSPHERE) 9-4.8 MCG/ACT AERO INHALE 2 PUFFS BY  MOUTH INTO THE LUNGS 2TIMES DAILY 10.7 g 0   hydrOXYzine (ATARAX/VISTARIL) 25 MG tablet Take 1 tablet (25 mg total) by mouth 3 (three) times daily as needed. 30 tablet 5   Insulin Pen Needle 32G X 4 MM MISC 1 Units by Does not apply route every morning. Pen needles 90 each 3   ipratropium-albuterol (DUONEB) 0.5-2.5 (3) MG/3ML SOLN USE 1 VIAL (3ML TOTAL) BY NEBULIZATION EVERY 6 HOURS AS NEEDED FOR SHORTNESS OFBREATH AND WHEEZING. 360 mL 0   ondansetron (ZOFRAN-ODT) 4 MG disintegrating tablet  TAKE 1 TABLET BY MOUTH EVERY 8 HOURS AS NEEDED FOR UP TO 5 DAYS 30 tablet 0   oxyCODONE (OXY IR/ROXICODONE) 5 MG immediate release tablet Take by mouth. Taking 2-3 times daily as needed     pramipexole (MIRAPEX) 0.5 MG tablet Take 1 tablet (0.5 mg total) by mouth 3 (three) times daily. 360 tablet 1   tiZANidine (ZANAFLEX) 2 MG tablet Take by mouth 3 (three) times daily.      VESICARE 5 MG tablet TAKE 1 TABLET BY MOUTH DAILY 90 tablet 1   VICTOZA 18 MG/3ML SOPN INJECT 0.3 ML (1.8 MG TOTAL) INTO THE SKIN DAILY 9 mL 2   No current facility-administered medications for this visit.    Review of Systems Review of Systems  All other systems reviewed and are negative. Or as discussed in the history of present illness.  Blood pressure (!) 156/80, pulse 69, temperature 98.3 F (36.8 C), temperature source Oral, height _0  (1.549 m), weight 189 lb 6.4 oz (85.9 kg), SpO2 97 %. Body mass index is 35.79 kg/m.  Physical Exam Physical Exam Constitutional:      General: She is not in acute distress.    Appearance: She is obese.  HENT:     Head: Normocephalic and atraumatic.     Nose:     Comments: Covered with a mask    Mouth/Throat:     Comments: Covered with a mask Eyes:     General: No scleral icterus.       Right eye: No discharge.        Left eye: No discharge.     Comments: No proptosis or exophthalmos.  No lid lag or stare.  Neck:     Comments: The trachea is midline.  There is no palpable  cervical or supraclavicular lymphadenopathy.  The contour of the thyroid is slightly irregular, but no discrete dominant masses or thyromegaly is appreciated.  The gland moves freely with deglutition. Cardiovascular:     Rate and Rhythm: Normal rate and regular rhythm.  Pulmonary:     Effort: Pulmonary effort is normal. No respiratory distress.  Abdominal:     General: Bowel sounds are normal.     Palpations: Abdomen is soft.  Genitourinary:    Comments: Deferred Musculoskeletal:        General: No swelling or tenderness.  Skin:    General: Skin is warm and dry.  Neurological:     General: No focal deficit present.     Mental Status: She is alert and oriented to person, place, and time.  Psychiatric:        Mood and Affect: Mood normal.        Behavior: Behavior normal.    Data Reviewed Results for RYAH, CRIBB (MRN [id number]) as of 02/05/2021 09:51  Ref. Range 01/09/2021 14:17  TSH Latest Ref Range: 0.450 - 4.500 uIU/mL 13.600 (H)  T4,Free(Direct) Latest Ref Range: 0.82 - 1.77 ng/dL 0.87  These labs demonstrate that she is hypothyroid.  She has since restarted taking her thyroid medication, but is unaware of the dose.  I personally reviewed the imaging of her thyroid and concur with the radiology interpretation that is copied here:  CLINICAL DATA:  Evaluate thyroid nodules.   EXAM: THYROID ULTRASOUND   TECHNIQUE: Ultrasound examination of the thyroid gland and adjacent soft tissues was performed.   COMPARISON:  09/03/2012   FINDINGS: Parenchymal Echotexture: Mildly heterogenous   Isthmus: 0.5 cm   Right lobe: 3.6 x 1.0 x  1.1 cm   Left lobe: 3.7 x 2.2 x 1.6 cm   _________________________________________________________   Estimated total number of nodules >/= 1 cm: 4   Number of spongiform nodules >/=  2 cm not described below (TR1): 0   Number of mixed cystic and solid nodules >/= 1.5 cm not described below ([place]): 0    _________________________________________________________   Nodule # 1:   Location: Right; Mid   Maximum size: 1.5 cm; Other 2 dimensions: 0.7 x 0.7 cm   Composition: solid/almost completely solid (2)   Echogenicity: isoechoic (1)   Shape: not taller-than-wide (0)   Margins: smooth (0)   Echogenic foci: none (0)   ACR TI-RADS total points: 3.   ACR TI-RADS risk category: TR3 (3 points).   ACR TI-RADS recommendations:   *Given size (>/= 1.5 - 2.4 cm) and appearance, a follow-up ultrasound in 1 year should be considered based on TI-RADS criteria.   _________________________________________________________   Nodule 3 is an isoechoic nodule in the left superior thyroid lobe. This nodule measures 1.2 x 0.9 x 1.0 cm. This is a TR 3 nodule. Given size (<1.4 cm) and appearance, this nodule does NOT meet TI-RADS criteria for biopsy or dedicated follow-up.   _________________________________________________________   Nodule # 4:   Prior biopsy: No   Location: Left; Mid   Maximum size: 1.8 cm; Other 2 dimensions: 1.5 x 1.2 cm, previously, 1.9 x 1.5 x 1.3 cm   Composition: solid/almost completely solid (2)   Echogenicity: isoechoic (1)   Shape: taller-than-wide (3)   Margins: ill-defined (0)   Echogenic foci: none (0)   ACR TI-RADS total points: 6.   ACR TI-RADS risk category:  TR4 (4-6 points).   Significant change in size (>/= 20% in two dimensions and minimal increase of 2 mm): No   Change in features: No   Change in ACR TI-RADS risk category: No   ACR TI-RADS recommendations:   **Given size (>/= 1.5 cm) and appearance, fine needle aspiration of this moderately suspicious nodule should be considered based on TI-RADS criteria.   _________________________________________________________   Nodule # 5:   Prior biopsy: No   Location: Left; Inferior   Maximum size: 1.9 cm; Other 2 dimensions: 1.5 x 1.3 cm, previously, 1.3 x 1.1 x 1.0 cm    Composition: solid/almost completely solid (2)   Echogenicity: isoechoic (1)   Shape: taller-than-wide (3)   Margins: ill-defined (0)   Echogenic foci: none (0)   ACR TI-RADS total points: 6.   ACR TI-RADS risk category:  TR4 (4-6 points).   Significant change in size (>/= 20% in two dimensions and minimal increase of 2 mm): Yes   Change in features: No   Change in ACR TI-RADS risk category: No   ACR TI-RADS recommendations:   **Given size (>/= 1.5 cm) and appearance, fine needle aspiration of this moderately suspicious nodule should be considered based on TI-RADS criteria.   _________________________________________________________   Nodule 6 is an isoechoic nodule on the left side of the isthmus that measures up to 0.9 cm. This is a TR 3 nodule. Given size (<1.4 cm) and appearance, this nodule does NOT meet TI-RADS criteria for biopsy or dedicated follow-up.   IMPRESSION: 1. Bilateral thyroid nodules. 2. Nodule #5 in the left inferior thyroid lobe has enlarged and this nodule meets criteria for ultrasound-guided biopsy. Recommend biopsy of this nodule. 3. Nodule #4 in the left mid thyroid lobe meets criteria for ultrasound-guided biopsy. This nodule may have minimally changed since  2014 but difficult to be certain based on the prior examination. Therefore, consider biopsy of this nodule. 4. Nodule #1 in the right mid thyroid lobe is a TR 3 nodule and meets criteria for 1 year follow-up.   The above is in keeping with the ACR TI-RADS recommendations - J Am Coll Radiol 2017;14:587-595.  Assessment This is a 50 year old woman with asymptomatic thyroid nodules, but 2 of them do meet criteria for fine-needle aspiration biopsy.  I discussed with her the possible outcomes of thyroid biopsy, including reviewing the Bethesda scale for thyroid cytopathology.  I also let her know that if we had an indeterminate result (Bethesda 3 or 4) that we would plan to send a sample  for molecular diagnostic testing.  She was interested in proceeding with the biopsy.  Plan Procedure Note: Ultrasound-Guided Fine Needle Aspiration Biopsy x2  Indications:  thyroid nodule (left) x2  Anesthetic: ethyl chloride spray  Procedure: Under direct sonographic guidance, the target lesion entheses midpole lesion) was isolated and sampled using a 25-gauge needle.  A total of 4 passes were made.  The air-dried Diff-Quick stained slides were viewed on-site by the performing surgeon.  Th[facility]roSeq [facility]) was collected. Pressure was held and an ice pack applied. Specimen(s) sent to pathology for evaluation. The patient tolerated the procedure well.   This procedure was repeated for the inferior nodule as well.  Findings: Thyroid follicular cells appreciated on the slides from each nodule  Complications: none    After completing the biopsies, the patient did ask whether or not she could simply have her thyroid gland removed to avoid the need for ongoing surveillance of the lesions.  I discussed with her the possibility of doing so but that we could wait for her biopsy results to return, as that may steer the conversation towards surgery versus observation.  I discussed the risks of the operation with her in detail.  She would like to speak further with her family and will let me know what her decision is.  I will contact her with the results of the biopsy and we will go from there.       Fredirick Maudlin 02/05/2021, 11:06 AM

## 2021-02-05 NOTE — Patient Instructions (Addendum)
Please be sure to follow up with your Primary Care Provider about your Thyroid medication and labs.   We will call you with the results.   If you have any concerns or questions, please feel free to call our office.   Thyroid Needle Biopsy, Care After  This sheet gives you information about how to care for yourself after your procedure. Your health care provider may also give you more specific instructions. If you have problems or questions, contact your health careprovider. What can I expect after the procedure? After the procedure, it is common to have: Soreness and tenderness that lasts for a few days. Bruising where the needle was inserted (puncture site). Follow these instructions at home:  Take over-the-counter and prescription medicines only as told by your health care provider. To help ease discomfort, keep your head raised (elevated) when you are lying down. When you move from lying down to sitting up, use both hands to support the back of your head and neck. Check your puncture site every day for signs of infection. Check for: Redness, swelling, or pain. Fluid or blood. Warmth. Pus or a bad smell. Return to your normal activities as told by your health care provider. Ask your health care provider what activities are safe for you. Keep all follow-up visits as told by your health care provider. This is important. Contact a health care provider if: You have redness, swelling, or pain around your puncture site. You have fluid or blood coming from your puncture site. Your puncture site feels warm to the touch. You have pus or a bad smell coming from your puncture site. You have a fever. Get help right away if: You have severe bleeding from the puncture site. You have difficulty swallowing. You have swollen glands (lymph nodes) in your neck. Summary It is common to have some bruising and soreness where the needle was inserted in your lower front neck area (puncture site). Check  your puncture site every day for signs of infection, such as redness, swelling, or pain. Get help right away if you have severe bleeding from your puncture site. This information is not intended to replace advice given to you by your health care provider. Make sure you discuss any questions you have with your healthcare provider. Document Revised: 02/02/2020 Document Reviewed: 02/02/2020 Elsevier Patient Education  [place].    Thyroidectomy  A thyroidectomy is a surgery that is done to remove the thyroid gland. The thyroid is a butterfly-shaped gland that is located at the lower front of your neck. It produces thyroid hormone, which is a substance that helps to control certain body processes. You may have a: Total thyroidectomy. All of your thyroid is removed. Thyroid lobectomy. Part of your thyroid is removed. The amount of thyroid gland tissue that is removed during your surgery depends on the reason for the procedure. Reasons to have this procedure include treatment for: Thyroid nodules. Thyroid cancer. Benign thyroid tumors. Goiter. Overactive thyroid gland (hyperthyroidism). There are two ways to do this procedure. Conventional, or open, thyroidectomy uses one large incision to remove the thyroid gland. This is the most common method. Endoscopic thyroidectomy, a less invasive method, uses a narrow tube with a light and camera (endoscope) to remove the gland. Tell a health care provider about: Any allergies you have. All medicines you are taking, including vitamins, herbs, eye drops, creams, and over-the-counter medicines. Any problems you or family members have had with anesthetic medicines. Any blood disorders you have. Any surgeries  you have had. Any medical conditions you have. Whether you are pregnant or may be pregnant. What are the risks? Generally, this is a safe procedure. However, problems may occur, including: Damage to the parathyroid glands. These are located  behind your thyroid gland. They maintain the calcium levels in the body. Damage may lead to: A decrease in parathyroid hormone levels (hypoparathyroidism). A decrease in calcium levels. This will make your nerves irritable and may cause muscle spasms. An increase in thyroid hormone. Damage to the nerves of your voice box (larynx). This can be temporary or long-term (rare). Hoarseness. This usually resolves in 24-48 hours. Bleeding. Infection. What happens before the procedure? Staying hydrated Follow instructions from your health care provider about hydration, which may include: Up to 2 hours before the procedure - you may continue to drink clear liquids, such as water, clear fruit juice, black coffee, and plain tea. Eating and drinking restrictions Follow instructions from your health care provider about eating and drinking, which may include: 8 hours before the procedure - stop eating heavy meals or foods such as meat, fried foods, or fatty foods. 6 hours before the procedure - stop eating light meals or foods, such as toast or cereal. 6 hours before the procedure - stop drinking milk or drinks that contain milk. 2 hours before the procedure - stop drinking clear liquids. Medicines Ask your health care provider about: Changing or stopping your regular medicines. This is especially important if you are taking diabetes medicines or blood thinners. Taking medicines such as aspirin and ibuprofen. These medicines can thin your blood. Do not take these medicines unless your health care provider tells you to take them. Taking over-the-counter medicines, vitamins, herbs, and supplements. General instructions You may be asked to shower with a germ-killing soap. Plan to have someone take you home from the hospital or clinic. Plan to have a responsible adult care for you for at least 24 hours after you leave the hospital or clinic. This is important. What happens during the procedure? To reduce  your risk of infection: Your health care team will wash or sanitize their hands. Hair may be removed from the surgical area. Your skin will be washed with soap. An IV will be inserted into one of your veins. You will be given one or more of the following: A medicine to help you relax (sedative). A medicine to make you fall asleep (general anesthetic). Your health care provider will perform your surgery using one of two methods: For open thyroidectomy, an incision will be made in your lower neck. Muscles in the area will be separated to reveal your thyroid gland. For endoscopic thyroidectomy, several small incisions will be made in your neck, chest, or armpit. An endoscopewill be inserted into an incision. Your health care provider may monitor laryngeal nerve function during the procedure for safety reasons. Part or all of your thyroid gland will be removed. A tube (drain) may be placed at the incision site to drain blood and fluids that accumulate under the skin after the procedure. The drain may have to stay in place for a day or two after the procedure. The incision will be closed with stitches (sutures). A dressing will be placed over your incision. The procedure may vary among health care providers and hospitals. What happens after the procedure? Your blood pressure, heart rate, breathing rate, and blood oxygen level will be monitored often until the medicines you were given have worn off. You will be given pain medicine as  needed. Your provider will check your ability to talk and swallow after the procedure. You will gradually start to drink liquids and have soft foods as tolerated. You may have a blood test to check the level of calcium in your body. If you had a drain put in during the procedure, it will usually be removed the next day. Summary A thyroidectomy is a surgery that is done to remove the thyroid gland. The procedure will be done in one of two ways: conventional, or open,  thyroidectomy or endoscopic thyroidectomy. Serious complications are rare. Plan to have a responsible adult care for you for at least 24 hours after you leave the hospital or clinic. This is important. This information is not intended to replace advice given to you by your health care provider. Make sure you discuss any questions you have with your healthcare provider. Document Revised: 02/02/2020 Document Reviewed: 02/02/2020 Elsevier Patient Education  [place].

## 2021-02-08 ENCOUNTER — Other Ambulatory Visit: Payer: Self-pay | Admitting: Nurse Practitioner

## 2021-02-08 DIAGNOSIS — K219 Gastro-esophageal reflux disease without esophagitis: Secondary | ICD-10-CM

## 2021-02-14 ENCOUNTER — Encounter: Payer: Self-pay | Admitting: General Surgery

## 2021-02-20 NOTE — Progress Notes (Signed)
BP 108/73   Pulse 95   Temp 98.6 F (37 C) (Oral)   Ht 5' 2" (1.575 m)   Wt 186 lb 14.4 oz (84.8 kg)   SpO2 95%   BMI 34.18 kg/m    Subjective:    Patient ID: Janet Murray, female    DOB: [birth year], 50 y.o.   MRN: [id number]  HPI: Janet Murray is a 50 y.o. female  Chief Complaint  Patient presents with   Hypothyroidism   HYPOTHYROIDISM Thyroid control status:controlled Satisfied with current treatment? yes Medication side effects: no Medication compliance: excellent compliance Etiology of hypothyroidism:  Recent dose adjustment:no Fatigue: no Cold intolerance: no Heat intolerance: no Weight gain: no Weight loss: no Constipation: no Diarrhea/loose stools: no Palpitations: no Lower extremity edema: no Anxiety/depressed mood: no  Relevant past medical, surgical, family and social history reviewed and updated as indicated. Interim medical history since our last visit reviewed. Allergies and medications reviewed and updated.  Review of Systems  Constitutional:  Negative for fatigue and unexpected weight change.  Cardiovascular:  Negative for palpitations and leg swelling.  Gastrointestinal:  Negative for constipation and diarrhea.  Endocrine: Negative for cold intolerance and heat intolerance.  Skin:        Discharge from breast  Psychiatric/Behavioral:  Negative for dysphoric mood. The patient is not nervous/anxious.    Per HPI unless specifically indicated above     Objective:    BP 108/73   Pulse 95   Temp 98.6 F (37 C) (Oral)   Ht 5' 2" (1.575 m)   Wt 186 lb 14.4 oz (84.8 kg)   SpO2 95%   BMI 34.18 kg/m   Wt Readings from Last 3 Encounters:  02/21/21 186 lb 14.4 oz (84.8 kg)  02/05/21 189 lb 6.4 oz (85.9 kg)  01/09/21 189 lb 3.2 oz (85.8 kg)    Physical Exam Vitals and nursing note reviewed.  Constitutional:      General: She is not in acute distress.    Appearance: Normal appearance. She is normal weight. She is not ill-appearing,  toxic-appearing or diaphoretic.  HENT:     Head: Normocephalic.     Right Ear: External ear normal.     Left Ear: External ear normal.     Nose: Nose normal.     Mouth/Throat:     Mouth: Mucous membranes are moist.     Pharynx: Oropharynx is clear.  Eyes:     General:        Right eye: No discharge.        Left eye: No discharge.     Extraocular Movements: Extraocular movements intact.     Conjunctiva/sclera: Conjunctivae normal.     Pupils: Pupils are equal, round, and reactive to light.  Cardiovascular:     Rate and Rhythm: Normal rate and regular rhythm.     Heart sounds: No murmur heard. Pulmonary:     Effort: Pulmonary effort is normal. No respiratory distress.     Breath sounds: Normal breath sounds. No wheezing or rales.  Chest:    Musculoskeletal:     Cervical back: Normal range of motion and neck supple.  Skin:    General: Skin is warm and dry.     Capillary Refill: Capillary refill takes less than 2 seconds.  Neurological:     General: No focal deficit present.     Mental Status: She is alert and oriented to person, place, and time. Mental status is at baseline.  Psychiatric:  Mood and Affect: Mood normal.        Behavior: Behavior normal.        Thought Content: Thought content normal.        Judgment: Judgment normal.    Results for orders placed or performed in visit on 01/09/21  Lipid Profile  Result Value Ref Range   Cholesterol, Total 200 (H) 100 - 199 mg/dL   Triglycerides 99 0 - 149 mg/dL   HDL 51 >39 mg/dL   VLDL Cholesterol Cal 18 5 - 40 mg/dL   LDL Chol Calc (NIH) 131 (H) 0 - 99 mg/dL   Chol/HDL Ratio 3.9 0.0 - 4.4 ratio  Comp Met (CMET)  Result Value Ref Range   Glucose 82 65 - 99 mg/dL   BUN 6 6 - 24 mg/dL   Creatinine, Ser 0.59 0.57 - 1.00 mg/dL   eGFR 110 >59 mL/min/1.73   BUN/Creatinine Ratio 10 9 - 23   Sodium 145 (H) 134 - 144 mmol/L   Potassium 4.1 3.5 - 5.2 mmol/L   Chloride 105 96 - 106 mmol/L   CO2 25 20 - 29 mmol/L    Calcium 8.9 8.7 - 10.2 mg/dL   Total Protein 6.1 6.0 - 8.5 g/dL   Albumin 4.2 3.8 - 4.8 g/dL   Globulin, Total 1.9 1.5 - 4.5 g/dL   Albumin/Globulin Ratio 2.2 1.2 - 2.2   Bilirubin Total 0.4 0.0 - 1.2 mg/dL   Alkaline Phosphatase 83 44 - 121 IU/L   AST 12 0 - 40 IU/L   ALT 6 0 - 32 IU/L  TSH  Result Value Ref Range   TSH 13.600 (H) 0.450 - 4.500 uIU/mL  T4, free  Result Value Ref Range   Free T4 0.87 0.82 - 1.77 ng/dL      Assessment & Plan:   Problem List Items Addressed This Visit       Endocrine   Acquired hypothyroidism - Primary    Chronic. Will repeat labs at visit today. Patient currently taking Levothyroxine 75mcg. Patient had recent biopsy of thyroid nodules. Waiting for results to determine whether nodules or entire thyroid will be removed.  Will make further recommendations based on lab results.      Relevant Orders   TSH   T4, free   Other Visit Diagnoses     Breast infection       Bactrim sent to treat infection.  Order placed for mammogram. Recommend patient obtain mammogram. Will make recommendations based on imaging.   Relevant Medications   sulfamethoxazole-trimethoprim (BACTRIM DS) 800-160 MG tablet   Encounter for screening mammogram for malignant neoplasm of breast       Relevant Orders   MM 3D SCREEN BREAST BILATERAL        Follow up plan: Return in about 3 months (around 05/23/2021) for HTN, HLD, DM2 FU.       

## 2021-02-21 ENCOUNTER — Ambulatory Visit (INDEPENDENT_AMBULATORY_CARE_PROVIDER_SITE_OTHER): Payer: Medicare Other | Admitting: Nurse Practitioner

## 2021-02-21 ENCOUNTER — Encounter: Payer: Self-pay | Admitting: Nurse Practitioner

## 2021-02-21 ENCOUNTER — Other Ambulatory Visit: Payer: Self-pay

## 2021-02-21 VITALS — BP 108/73 | HR 95 | Temp 98.6°F | Ht 62.0 in | Wt 186.9 lb

## 2021-02-21 DIAGNOSIS — N61 Mastitis without abscess: Secondary | ICD-10-CM

## 2021-02-21 DIAGNOSIS — E039 Hypothyroidism, unspecified: Secondary | ICD-10-CM

## 2021-02-21 DIAGNOSIS — I25118 Atherosclerotic heart disease of native coronary artery with other forms of angina pectoris: Secondary | ICD-10-CM

## 2021-02-21 DIAGNOSIS — Z1231 Encounter for screening mammogram for malignant neoplasm of breast: Secondary | ICD-10-CM

## 2021-02-21 MED ORDER — SULFAMETHOXAZOLE-TRIMETHOPRIM 800-160 MG PO TABS: 1.0000 | ORAL_TABLET | Freq: Two times a day (BID) | ORAL | 0 refills | Status: DC

## 2021-02-21 NOTE — Assessment & Plan Note (Signed)
Chronic. Will repeat labs at visit today. Patient currently taking Levothyroxine 10mg. Patient had recent biopsy of thyroid nodules. Waiting for results to determine whether nodules or entire thyroid will be removed.  Will make further recommendations based on lab results.

## 2021-02-22 LAB — TSH: TSH: 5.44 u[IU]/mL — ABNORMAL HIGH (ref 0.450–4.500)

## 2021-02-22 LAB — T4, FREE: Free T4: 1.1 ng/dL (ref 0.82–1.77)

## 2021-02-22 MED ORDER — ATORVASTATIN CALCIUM 80 MG PO TABS: 80.0000 mg | ORAL_TABLET | Freq: Every day | ORAL | 3 refills | Status: DC

## 2021-02-22 NOTE — Addendum Note (Signed)
Addended by: Jon Billings on: 02/22/2021 07:43 AM   Modules accepted: Orders

## 2021-02-22 NOTE — Progress Notes (Signed)
Hi Denica.  Your thyroid has improved.  Continue with your current dose of levothyroxine. We will wait and see what your biopsy says and the decision your make about your thyroid to change the medication dose.  Let me know if you have any questions.

## 2021-02-26 ENCOUNTER — Other Ambulatory Visit: Payer: Medicare Other

## 2021-02-27 ENCOUNTER — Telehealth: Payer: Self-pay

## 2021-02-27 ENCOUNTER — Ambulatory Visit (INDEPENDENT_AMBULATORY_CARE_PROVIDER_SITE_OTHER): Payer: Medicare Other | Admitting: Pulmonary Disease

## 2021-02-27 ENCOUNTER — Encounter: Payer: Self-pay | Admitting: Pulmonary Disease

## 2021-02-27 ENCOUNTER — Encounter: Payer: Self-pay | Admitting: General Surgery

## 2021-02-27 ENCOUNTER — Other Ambulatory Visit: Payer: Self-pay

## 2021-02-27 VITALS — BP 118/64 | HR 67 | Temp 97.7°F | Ht 62.0 in | Wt 190.0 lb

## 2021-02-27 DIAGNOSIS — J449 Chronic obstructive pulmonary disease, unspecified: Secondary | ICD-10-CM

## 2021-02-27 DIAGNOSIS — F17210 Nicotine dependence, cigarettes, uncomplicated: Secondary | ICD-10-CM

## 2021-02-27 DIAGNOSIS — J4540 Moderate persistent asthma, uncomplicated: Secondary | ICD-10-CM

## 2021-02-27 MED ORDER — ALBUTEROL SULFATE (2.5 MG/3ML) 0.083% IN NEBU: 2.5000 mg | INHALATION_SOLUTION | Freq: Four times a day (QID) | RESPIRATORY_TRACT | 12 refills | Status: DC | PRN

## 2021-02-27 MED ORDER — BEVESPI AEROSPHERE 9-4.8 MCG/ACT IN AERO: INHALATION_SPRAY | RESPIRATORY_TRACT | 11 refills | Status: DC

## 2021-02-27 NOTE — Telephone Encounter (Signed)
Spoke with patient today-I let her know we do not have the test results back yet and that Dr.Cannon would be in touch as soon as we receive the results.

## 2021-02-27 NOTE — Progress Notes (Addendum)
Subjective:    Patient ID: Janet Murray, female    DOB: June 05, 1971, 50 y.o.   MRN: [id number] Chief Complaint  Patient presents with   Follow-up    Prod cough with white sputum and occ wheezing.      HPI 50 year old female active smoker (half PPD) followed for severe COPD with emphysema Chronic back pain s/p multiple surgeries- on disability . On chronic narcotics.  Prior patient of Dr. Ashby Dawes, my first encounter with this patient.  Last seen by Dr. Ashby Dawes in February 2020 subsequently seen by Rexene Edison, NP in July 2021.  This is a scheduled visit.    I reviewed the patient's records and prior PFTs.  She has a very intense asthmatic component in addition to COPD.  She is unfortunately intolerant of ICS due to issues with thrush.  She tolerates Bevespi well but does not tolerate Breztri.  Has not tried other ICS but is reluctant to do so at present.  She continues to smoke and we have counseled her extensively regards to discontinuation of smoking.  She does not endorse any fevers, chills or sweats.  Dyspnea is at baseline and feels that she is very well controlled in this regard.  Cough also is at baseline, she notes occasional wheezing but this is not unusual for her.  She does not endorse any other symptomatology today.   DATA: 10/29/2017 PFT: FEV1 0.66 L or 27% predicted, FVC 1.18 L or 41% predicted, FEV1/FVC 56%, there is significant bronchodilator response with a net change of 58% in FEV1.  Post FEV1 1.04 L or 43% predicted.  Diffusion capacity moderately reduced.  Review of Systems A 10 point review of systems was performed and it is as noted above otherwise negative.  Past Medical History:  Diagnosis Date   Abdominal mass, RUQ (right upper quadrant)    Anxiety    Arthritis    Asthma    Bronchitis    Carpal tunnel syndrome of left wrist 10/25/2014   Chronic bronchitis, obstructive (HCC) 07/20/2017   Chronic pain    COPD (chronic obstructive pulmonary disease)  (HCC)    Cyst of right kidney 03/22/2018   DDD (degenerative disc disease), lumbar 10/25/2014   Degenerative joint disease (DJD) of hip    Diarrhea of infectious origin 03/25/2018   Dysphagia    Emphysema of lung (HCC)    Facet syndrome, lumbar 10/25/2014   Fatty liver 03/25/2018   Foot fracture, left 05/02/15   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   GERD (gastroesophageal reflux disease)    Hereditary and idiopathic peripheral neuropathy 11/06/2014   IBS (irritable bowel syndrome)    Low back pain    Migraine 02/04/2016   Occipital neuralgia    Osteoarthritis of spine with radiculopathy, lumbar region 10/25/2014   Other intervertebral disc degeneration, lumbar region    Other irritable bowel syndrome 04/20/2017   Other spondylosis with radiculopathy, lumbar region 10/25/2014   RLS (restless legs syndrome)    Sacroiliac joint dysfunction of both sides 10/25/2014   Spondylosis of lumbar spine    Urinary incontinence    Past Surgical History:  Procedure Laterality Date   APPENDECTOMY     BLADDER SURGERY     sling - Dr. Davis Gourd   CESAREAN SECTION     COLONOSCOPY WITH PROPOFOL N/A 04/11/2020   Procedure: COLONOSCOPY WITH PROPOFOL;  Surgeon: Virgel Manifold, MD;  Location: [facility] ENDOSCOPY;  Service: Endoscopy;  Laterality: N/A;   ESOPHAGOGASTRODUODENOSCOPY  04/26/2019   STOMACH SURGERY  TOTAL ABDOMINAL HYSTERECTOMY W/ BILATERAL SALPINGOOPHORECTOMY     Family History  Problem Relation Age of Onset   Neuropathy Mother    Arthritis Father    Hypertension Father    Leukemia Father    Heart disease Father        CAD s/p CABG   Healthy Brother    Healthy Daughter    Healthy Son    Dementia Maternal Grandmother    Hypertension Maternal Grandmother    Hypercholesterolemia Maternal Grandmother    Heart disease Maternal Grandmother    Healthy Brother    Healthy Brother    Stroke Neg Hx    Cancer Neg Hx    Social History   Tobacco Use   Smoking status: Every Day    Packs/day:  1.00    Years: 32.00    Pack years: 32.00    Types: Cigarettes   Smokeless tobacco: Never   Tobacco comments:    0.5PPD 02/27/2021  Substance Use Topics   Alcohol use: Not Currently    Alcohol/week: 0.0 standard drinks    Comment: occassionally   Allergies  Allergen Reactions   Penicillins Hives and Swelling    Has patient had a PCN reaction causing immediate rash, facial/tongue/throat swelling, SOB or lightheadedness with hypotension: Yes Has patient had a PCN reaction causing severe rash involving mucus membranes or skin necrosis: No Has patient had a PCN reaction that required hospitalization No Has patient had a PCN reaction occurring within the last 10 years: No If all of the above answers are "NO", then may proceed with Cephalosporin use.   Acetaminophen     Due to polyps    Ciprofloxacin Itching   Aspirin Palpitations    Heart palpitations   Hydrocodone Hives and Rash   Other Rash    bandaids   Tape Rash    bandaids bandaids   Current Meds  Medication Sig   AMBULATORY NON FORMULARY MEDICATION Medication Name: nebulizer DX: J44.9   atorvastatin (LIPITOR) 80 MG tablet Take 1 tablet (80 mg total) by mouth daily.   diclofenac Sodium (VOLTAREN) 1 % GEL Apply topically.   esomeprazole (NEXIUM) 40 MG capsule TAKE ONE CAPSULE BY MOUTH TWICE A DAY   Glycopyrrolate-Formoterol (BEVESPI AEROSPHERE) 9-4.8 MCG/ACT AERO INHALE 2 PUFFS BY MOUTH INTO THE LUNGS 2TIMES DAILY   hydrOXYzine (ATARAX/VISTARIL) 25 MG tablet Take 1 tablet (25 mg total) by mouth 3 (three) times daily as needed.   Insulin Pen Needle 32G X 4 MM MISC 1 Units by Does not apply route every morning. Pen needles   ondansetron (ZOFRAN-ODT) 4 MG disintegrating tablet TAKE 1 TABLET BY MOUTH EVERY 8 HOURS AS NEEDED FOR UP TO 5 DAYS   oxyCODONE (OXY IR/ROXICODONE) 5 MG immediate release tablet Take by mouth. Taking 2-3 times daily as needed   pramipexole (MIRAPEX) 0.5 MG tablet Take 1 tablet (0.5 mg total) by mouth 3  (three) times daily.   sulfamethoxazole-trimethoprim (BACTRIM DS) 800-160 MG tablet Take 1 tablet by mouth 2 (two) times daily.   tiZANidine (ZANAFLEX) 2 MG tablet Take by mouth 3 (three) times daily.    VENTOLIN HFA 108 (90 Base) MCG/ACT inhaler INHALE 2 PUFFS BY MOUTH INTO THE LUNGS EVERY 6 HOURS AS NEEDED FOR WHEEZING OR SHORTNESS OF BREATH   VESICARE 5 MG tablet TAKE 1 TABLET BY MOUTH DAILY   VICTOZA 18 MG/3ML SOPN INJECT 0.3 ML (1.8 MG TOTAL) INTO THE SKIN DAILY   [DISCONTINUED] albuterol (PROVENTIL) (2.5 MG/3ML) 0.083% nebulizer solution Take 3  mLs (2.5 mg total) by nebulization every 6 (six) hours as needed for wheezing or shortness of breath.   [DISCONTINUED] Glycopyrrolate-Formoterol (BEVESPI AEROSPHERE) 9-4.8 MCG/ACT AERO INHALE 2 PUFFS BY MOUTH INTO THE LUNGS 2TIMES DAILY   [DISCONTINUED] ipratropium-albuterol (DUONEB) 0.5-2.5 (3) MG/3ML SOLN USE 1 VIAL (3ML TOTAL) BY NEBULIZATION EVERY 6 HOURS AS NEEDED FOR SHORTNESS OFBREATH AND WHEEZING.   Immunization History  Administered Date(s) Administered   Td 07/10/2013      Objective:   Physical Exam BP 118/64 (BP Location: Left Arm, Cuff Size: Normal)   Pulse 67   Temp 97.7 F (36.5 C) (Temporal)   Ht 5\' 2"  (1.575 m)   Wt 190 lb (86.2 kg)   SpO2 98%   BMI 34.75 kg/m  GENERAL: Somewhat disheveled appearing woman, no acute distress.  Presents in transport chair. HEAD: Normocephalic, atraumatic.  EYES: Pupils equal, round, reactive to light.  No scleral icterus.  MOUTH: Nose/mouth/throat not examined due to masking requirements for COVID 19. NECK: Supple. No thyromegaly. Trachea midline. No JVD.  No adenopathy. PULMONARY: Good air entry bilaterally.  No adventitious sounds. CARDIOVASCULAR: S1 and S2. Regular rate and rhythm.  ABDOMEN: Benign. MUSCULOSKELETAL: No joint deformity, no clubbing, no edema.  NEUROLOGIC: No overt focal deficit SKIN: Intact,warm,dry. PSYCH: Somewhat anxious mood, normal behavior       Assessment  & Plan:     ICD-10-CM   1. COPD with chronic bronchitis and emphysema ([place])  J44.9 Pulmonary Function Test [facility] Only   COPD stage IV by GOLD criteria Continue Bevespi, patient intolerant of ICS    2. Moderate persistent asthma without complication  I77.82    Patient is intolerant of ICS Will reassess lung function May need assessment for alternative asthma modulators    3. Tobacco dependence due to cigarettes  F17.210    Patient was counseled regards to discontinuation of smoking Total counseling time 3 to 5 minutes      Meds ordered this encounter  Medications   Glycopyrrolate-Formoterol (BEVESPI AEROSPHERE) 9-4.8 MCG/ACT AERO    Sig: INHALE 2 PUFFS BY MOUTH INTO THE LUNGS 2TIMES DAILY    Dispense:  10.7 g    Refill:  11    Patient needs OV for further refills.   DISCONTD: albuterol (PROVENTIL) (2.5 MG/3ML) 0.083% nebulizer solution    Sig: Take 3 mLs (2.5 mg total) by nebulization every 6 (six) hours as needed for wheezing or shortness of breath.    Dispense:  75 mL    Refill:  12    Discontinue DuoNeb   albuterol (PROVENTIL) (2.5 MG/3ML) 0.083% nebulizer solution    Sig: Take 3 mLs (2.5 mg total) by nebulization every 6 (six) hours as needed for wheezing or shortness of breath.    Dispense:  75 mL    Refill:  12    J44.9   Orders Placed This Encounter  Procedures   Pulmonary Function Test [facility] Only    Standing Status:   Future    Standing Expiration Date:   02/27/2022    Scheduling Instructions:     Next available.    Order Specific Question:   Full PFT: includes the following: basic spirometry, spirometry pre & post bronchodilator, diffusion capacity (DLCO), lung volumes    Answer:   Full PFT   We will see the patient in follow-up in 3 months time she is to contact us prior to that time should any new difficulties arise.  Janet Don, MD Advanced Bronchoscopy P[facility]     *  This note was dictated using voice recognition  software/Dragon.  Despite best efforts to proofread, errors can occur which can change the meaning.  Any change was purely unintentional.

## 2021-02-27 NOTE — Patient Instructions (Signed)
We are ordering a breathing test.  Continue Bevespi 2 puffs twice a day.  I encourage you to quit smoking keep working with your primary care provider in this regard.  We will see him in follow-up in 3 months time.

## 2021-02-28 MED ORDER — ALBUTEROL SULFATE (2.5 MG/3ML) 0.083% IN NEBU: 2.5000 mg | INHALATION_SOLUTION | Freq: Four times a day (QID) | RESPIRATORY_TRACT | 12 refills | Status: DC | PRN

## 2021-03-04 ENCOUNTER — Telehealth: Payer: Self-pay

## 2021-03-04 NOTE — Telephone Encounter (Signed)
Lm for reminder of covid test prior to PFT.  03/07/2021 [facility]

## 2021-03-05 ENCOUNTER — Ambulatory Visit (INDEPENDENT_AMBULATORY_CARE_PROVIDER_SITE_OTHER): Payer: Medicare Other

## 2021-03-05 DIAGNOSIS — Z0000 Encounter for general adult medical examination without abnormal findings: Secondary | ICD-10-CM | POA: Diagnosis not present

## 2021-03-05 NOTE — Progress Notes (Signed)
Subjective:   Janet Murray is a 50 y.o. female who presents for Medicare Annual (Subsequent) preventive examination.  I connected with  Janet Murray on 03/05/21 by an audio only telemedicine application and verified that I am speaking with the correct person using two identifiers.   I discussed the limitations, risks, security and privacy concerns of performing an evaluation and management service by telephone and the availability of in person appointments. I also discussed with the patient that there may be a patient responsible charge related to this service. The patient expressed understanding and verbally consented to this telephonic visit.  Location of Patient: Home Location of Provider: Office  List any persons and their role that are participating in the visit with the patient.    Review of Systems    Defer to PCP Cardiac Risk Factors include: none     Objective:    Today's Vitals   03/05/21 1746  PainSc: 7    There is no height or weight on file to calculate BMI.  Advanced Directives 03/05/2021 02/08/2020 07/12/2019 07/22/2018 06/22/2017 03/24/2017 02/06/2016  Does Patient Have a Medical Advance Directive? No No No No No No No  Would patient like information on creating a medical advance directive? No - Patient declined - - - No - Patient declined No - Patient declined No - patient declined information    Current Medications (verified) Outpatient Encounter Medications as of 03/05/2021  Medication Sig   albuterol (PROVENTIL) (2.5 MG/3ML) 0.083% nebulizer solution Take 3 mLs (2.5 mg total) by nebulization every 6 (six) hours as needed for wheezing or shortness of breath.   AMBULATORY NON FORMULARY MEDICATION Medication Name: nebulizer DX: J44.9   atorvastatin (LIPITOR) 80 MG tablet Take 1 tablet (80 mg total) by mouth daily.   diclofenac Sodium (VOLTAREN) 1 % GEL Apply topically.   esomeprazole (NEXIUM) 40 MG capsule TAKE ONE CAPSULE BY MOUTH TWICE A DAY    Glycopyrrolate-Formoterol (BEVESPI AEROSPHERE) 9-4.8 MCG/ACT AERO INHALE 2 PUFFS BY MOUTH INTO THE LUNGS 2TIMES DAILY   hydrOXYzine (ATARAX/VISTARIL) 25 MG tablet Take 1 tablet (25 mg total) by mouth 3 (three) times daily as needed.   Insulin Pen Needle 32G X 4 MM MISC 1 Units by Does not apply route every morning. Pen needles   ondansetron (ZOFRAN-ODT) 4 MG disintegrating tablet TAKE 1 TABLET BY MOUTH EVERY 8 HOURS AS NEEDED FOR UP TO 5 DAYS   oxyCODONE (OXY IR/ROXICODONE) 5 MG immediate release tablet Take by mouth. Taking 2-3 times daily as needed   pramipexole (MIRAPEX) 0.5 MG tablet Take 1 tablet (0.5 mg total) by mouth 3 (three) times daily.   sulfamethoxazole-trimethoprim (BACTRIM DS) 800-160 MG tablet Take 1 tablet by mouth 2 (two) times daily.   tiZANidine (ZANAFLEX) 2 MG tablet Take by mouth 3 (three) times daily.    VENTOLIN HFA 108 (90 Base) MCG/ACT inhaler INHALE 2 PUFFS BY MOUTH INTO THE LUNGS EVERY 6 HOURS AS NEEDED FOR WHEEZING OR SHORTNESS OF BREATH   VESICARE 5 MG tablet TAKE 1 TABLET BY MOUTH DAILY   VICTOZA 18 MG/3ML SOPN INJECT 0.3 ML (1.8 MG TOTAL) INTO THE SKIN DAILY   No facility-administered encounter medications on file as of 03/05/2021.    Allergies (verified) Penicillins, Acetaminophen, Ciprofloxacin, Aspirin, Hydrocodone, Other, and Tape   History: Past Medical History:  Diagnosis Date   Abdominal mass, RUQ (right upper quadrant)    Anxiety    Arthritis    Asthma    Bronchitis  Carpal tunnel syndrome of left wrist 10/25/2014   Chronic bronchitis, obstructive (HCC) 07/20/2017   Chronic pain    COPD (chronic obstructive pulmonary disease) (HCC)    Cyst of right kidney 03/22/2018   DDD (degenerative disc disease), lumbar 10/25/2014   Degenerative joint disease (DJD) of hip    Diarrhea of infectious origin 03/25/2018   Dysphagia    Emphysema of lung (HCC)    Facet syndrome, lumbar 10/25/2014   Fatty liver 03/25/2018   Foot fracture, left 05/02/15   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   GERD (gastroesophageal reflux disease)    Hereditary and idiopathic peripheral neuropathy 11/06/2014   IBS (irritable bowel syndrome)    Low back pain    Migraine 02/04/2016   Occipital neuralgia    Osteoarthritis of spine with radiculopathy, lumbar region 10/25/2014   Other intervertebral disc degeneration, lumbar region    Other irritable bowel syndrome 04/20/2017   Other spondylosis with radiculopathy, lumbar region 10/25/2014   RLS (restless legs syndrome)    Sacroiliac joint dysfunction of both sides 10/25/2014   Spondylosis of lumbar spine    Urinary incontinence    Past Surgical History:  Procedure Laterality Date   APPENDECTOMY     BLADDER SURGERY     sling - Dr. Davis Gourd   CESAREAN SECTION     COLONOSCOPY WITH PROPOFOL N/A 04/11/2020   Procedure: COLONOSCOPY WITH PROPOFOL;  Surgeon: Virgel Manifold, MD;  Location: [facility] ENDOSCOPY;  Service: Endoscopy;  Laterality: N/A;   ESOPHAGOGASTRODUODENOSCOPY  04/26/2019   STOMACH SURGERY     TOTAL ABDOMINAL HYSTERECTOMY W/ BILATERAL SALPINGOOPHORECTOMY     Family History  Problem Relation Age of Onset   Neuropathy Mother    Arthritis Father    Hypertension Father    Leukemia Father    Heart disease Father        CAD s/p CABG   Healthy Brother    Healthy Daughter    Healthy Son    Dementia Maternal Grandmother    Hypertension Maternal Grandmother    Hypercholesterolemia Maternal Grandmother    Heart disease Maternal Grandmother    Healthy Brother    Healthy Brother    Stroke Neg Hx    Cancer Neg Hx    Social History   Socioeconomic History   Marital status: Single    Spouse name: Not on file   Number of children: Not on file   Years of education: Not on file   Highest education level: Not on file  Occupational History   Not on file  Tobacco Use   Smoking status: Every Day    Packs/day: 1.00    Years: 32.00    Pack years: 32.00    Types: Cigarettes   Smokeless tobacco: Never    Tobacco comments:    0.5PPD 02/27/2021  Vaping Use   Vaping Use: Never used  Substance and Sexual Activity   Alcohol use: Not Currently    Alcohol/week: 0.0 standard drinks    Comment: occassionally   Drug use: No   Sexual activity: Not Currently  Other Topics Concern   Not on file  Social History Narrative   Not on file   Social Determinants of Health   Financial Resource Strain: Low Risk    Difficulty of Paying Living Expenses: Not hard at all  Food Insecurity: No Food Insecurity   Worried About Charity fundraiser in the Last Year: Never true   Ran Out of Food in the Last Year: Never true  Transportation  Needs: No Transportation Needs   Lack of Transportation (Medical): No   Lack of Transportation (Non-Medical): No  Physical Activity: Insufficiently Active   Days of Exercise per Week: 3 days   Minutes of Exercise per Session: 20 min  Stress: No Stress Concern Present   Feeling of Stress : Only a little  Social Connections: Moderately Isolated   Frequency of Communication with Friends and Family: More than three times a week   Frequency of Social Gatherings with Friends and Family: More than three times a week   Attends Religious Services: Never   Marine scientist or Organizations: Yes   Attends Archivist Meetings: Never   Marital Status: Divorced    Tobacco Counseling Ready to quit: Not Answered Counseling given: Not Answered Tobacco comments: 0.5PPD 02/27/2021   Clinical Intake:  Pre-visit preparation completed: Yes  Pain : 0-10 Pain Score: 7  Pain Type: Acute pain Pain Location: Back Pain Orientation: Right     Nutritional Risks: None Diabetes: No  How often do you need to have someone help you when you read instructions, pamphlets, or other written materials from your doctor or pharmacy?: 1 - Never What is the last grade level you completed in school?: 10th grade  Diabetic?NO  Interpreter Needed?: No      Activities of Daily  Living In your present state of health, do you have any difficulty performing the following activities: 03/05/2021  Hearing? Y  Vision? Y  Difficulty concentrating or making decisions? N  Walking or climbing stairs? N  Dressing or bathing? N  Doing errands, shopping? N  Preparing Food and eating ? N  Using the Toilet? N  In the past six months, have you accidently leaked urine? N  Do you have problems with loss of bowel control? N  Managing your Medications? N  Managing your Finances? N  Housekeeping or managing your Housekeeping? N  Some recent data might be hidden    Patient Care Team: Jon Billings, NP as PCP - General Jacolyn Reedy, Sherrine Maples, MD as Consulting Physician (Anesthesiology) Jamelle Rushing, MD as Referring Physician (Dermatology) Sherri Rad, MD as Referring Physician (Gastroenterology)  Indicate any recent Medical Services you may have received from other than [facility] providers in the past year (date may be approximate).     Assessment:   This is a routine wellness examination for Xavier.  Hearing/Vision screen No results found.  Dietary issues and exercise activities discussed: Current Exercise Habits: Home exercise routine, Type of exercise: walking, Time (Minutes): 20, Frequency (Times/Week): 3, Weekly Exercise (Minutes/Week): 60, Intensity: Mild, Exercise limited by: None identified   Goals Addressed   None   Depression Screen PHQ 2/9 Scores 03/05/2021 10/29/2020 10/26/2019 07/12/2019 06/14/2018 02/06/2016 12/19/2015  PHQ - 2 Score 0 0 2 0 0 0 0  PHQ- 9 Score - 0 7 - - - -  Exception Documentation - - - - - - Patient refusal    Fall Risk Fall Risk  03/05/2021 02/05/2021 02/05/2021 10/29/2020 09/27/2020  Falls in the past year? 0 0 0 0 0  Comment - - - - -  Number falls in past yr: 0 - - 0 -  Injury with Fall? 0 - - 0 -  Comment - - - - -  Risk for fall due to : No Fall Risks - - - -  Follow up Falls evaluation completed - - Falls evaluation  completed -    FALL RISK PREVENTION PERTAINING TO THE HOME:  Any stairs in or around the home? Yes  If so, are there any without handrails? Yes  Home free of loose throw rugs in walkways, pet beds, electrical cords, etc? Yes  Adequate lighting in your home to reduce risk of falls? Yes   ASSISTIVE DEVICES UTILIZED TO PREVENT FALLS:  Life alert? No  Use of a cane, walker or w/c? Yes  Grab bars in the bathroom? No  Shower chair or bench in shower? No  Elevated toilet seat or a handicapped toilet? No   TIMED UP AND GO:  Was the test performed? No .  Length of time to ambulate 10 feet: N/A sec.     Cognitive Function:     6CIT Screen 03/05/2021  What Year? 0 points  What month? 0 points  What time? 0 points  Count back from 20 0 points  Months in reverse 0 points  Repeat phrase 6 points  Total Score 6    Immunizations Immunization History  Administered Date(s) Administered   Td 07/10/2013    TDAP status: Up to date  Flu Vaccine status: Declined, Education has been provided regarding the importance of this vaccine but patient still declined. Advised may receive this vaccine at local pharmacy or Health Dept. Aware to provide a copy of the vaccination record if obtained from local pharmacy or Health Dept. Verbalized acceptance and understanding.  Pneumococcal vaccine status: Declined,  Education has been provided regarding the importance of this vaccine but patient still declined. Advised may receive this vaccine at local pharmacy or Health Dept. Aware to provide a copy of the vaccination record if obtained from local pharmacy or Health Dept. Verbalized acceptance and understanding.   Covid-19 vaccine status: Declined, Education has been provided regarding the importance of this vaccine but patient still declined. Advised may receive this vaccine at local pharmacy or Health Dept.or vaccine clinic. Aware to provide a copy of the vaccination record if obtained from local  pharmacy or Health Dept. Verbalized acceptance and understanding.  Qualifies for Shingles Vaccine? Yes   Zostavax completed No   Shingrix Completed?: No.    Education has been provided regarding the importance of this vaccine. Patient has been advised to call insurance company to determine out of pocket expense if they have not yet received this vaccine. Advised may also receive vaccine at local pharmacy or Health Dept. Verbalized acceptance and understanding.  Screening Tests Health Maintenance  Topic Date Due   URINE MICROALBUMIN  Never done   Zoster Vaccines- Shingrix (1 of 2) Never done   COVID-19 Vaccine (1) 03/15/2021 (Originally 01/15/1971)   Hepatitis C Screening  04/12/2021 (Originally 07/17/1988)   HIV Screening  04/12/2021 (Originally 07/17/1985)   INFLUENZA VACCINE  09/06/2021 (Originally 01/07/2021)   MAMMOGRAM  10/29/2021 (Originally 07/17/1988)   COLONOSCOPY (Pts 45-37yrs Insurance coverage will need to be confirmed)  04/11/2021   TETANUS/TDAP  07/11/2023   HPV VACCINES  Aged Out    Health Maintenance  Health Maintenance Due  Topic Date Due   URINE MICROALBUMIN  Never done   Zoster Vaccines- Shingrix (1 of 2) Never done    Colorectal cancer screening: Type of screening: Colonoscopy. Completed 04/11/20. Repeat every 10 years  Mammogram status: Ordered 02/21/21. Pt provided with contact info and advised to call to schedule appt.     Lung Cancer Screening: (Low Dose CT Chest recommended if Age 62-80 years, 30 pack-year currently smoking OR have quit w/in 15years.) does not qualify.   Lung Cancer Screening Referral: N/A  Additional  Screening:  Hepatitis C Screening: Declined  Vision Screening: Recommended annual ophthalmology exams for early detection of glaucoma and other disorders of the eye. Is the patient up to date with their annual eye exam?  Yes  Who is the provider or what is the name of the office in which the patient attends annual eye exams? N/A If pt is not  established with a provider, would they like to be referred to a provider to establish care? No .   Dental Screening: Recommended annual dental exams for proper oral hygiene  Community Resource Referral / Chronic Care Management: CRR required this visit?  No   CCM required this visit?  No      Plan:     I have personally reviewed and noted the following in the patient's chart:   Medical and social history Use of alcohol, tobacco or illicit drugs  Current medications and supplements including opioid prescriptions.  Functional ability and status Nutritional status Physical activity Advanced directives List of other physicians Hospitalizations, surgeries, and ER visits in previous 12 months Vitals Screenings to include cognitive, depression, and falls Referrals and appointments  In addition, I have reviewed and discussed with patient certain preventive protocols, quality metrics, and best practice recommendations. A written personalized care plan for preventive services as well as general preventive health recommendations were provided to patient.    Ms. Strauch , Thank you for taking time to come for your Medicare Wellness Visit. I appreciate your ongoing commitment to your health goals. Please review the following plan we discussed and let me know if I can assist you in the future.   These are the goals we discussed:  Goals   None     This is a list of the screening recommended for you and due dates:  Health Maintenance  Topic Date Due   Urine Protein Check  Never done   Zoster (Shingles) Vaccine (1 of 2) Never done   COVID-19 Vaccine (1) 03/15/2021*   Hepatitis C Screening: USPSTF Recommendation to screen - Ages 18-79 yo.  04/12/2021*   HIV Screening  04/12/2021*   Flu Shot  09/06/2021*   Mammogram  10/29/2021*   Colon Cancer Screening  04/11/2021   Tetanus Vaccine  07/11/2023   HPV Vaccine  Aged Out  *Topic was postponed. The date shown is not the original due  date.      Georgina Peer, [place]   03/05/2021   Nurse Notes: Non Face to Face 60 Minutes

## 2021-03-05 NOTE — Telephone Encounter (Signed)
Lm x2 for patient.  Will close encounter per office protocol.   

## 2021-03-05 NOTE — Patient Instructions (Signed)
Health Maintenance, Female Adopting a healthy lifestyle and getting preventive care are important in promoting health and wellness. Ask your health care provider about: The right schedule for you to have regular tests and exams. Things you can do on your own to prevent diseases and keep yourself healthy. What should I know about diet, weight, and exercise? Eat a healthy diet  Eat a diet that includes plenty of vegetables, fruits, low-fat dairy products, and lean protein. Do not eat a lot of foods that are high in solid fats, added sugars, or sodium. Maintain a healthy weight Body mass index (BMI) is used to identify weight problems. It estimates body fat based on height and weight. Your health care provider can help determine your BMI and help you achieve or maintain a healthy weight. Get regular exercise Get regular exercise. This is one of the most important things you can do for your health. Most adults should: Exercise for at least 150 minutes each week. The exercise should increase your heart rate and make you sweat (moderate-intensity exercise). Do strengthening exercises at least twice a week. This is in addition to the moderate-intensity exercise. Spend less time sitting. Even light physical activity can be beneficial. Watch cholesterol and blood lipids Have your blood tested for lipids and cholesterol at 50 years of age, then have this test every 5 years. Have your cholesterol levels checked more often if: Your lipid or cholesterol levels are high. You are older than 50 years of age. You are at high risk for heart disease. What should I know about cancer screening? Depending on your health history and family history, you may need to have cancer screening at various ages. This may include screening for: Breast cancer. Cervical cancer. Colorectal cancer. Skin cancer. Lung cancer. What should I know about heart disease, diabetes, and high blood pressure? Blood pressure and heart  disease High blood pressure causes heart disease and increases the risk of stroke. This is more likely to develop in people who have high blood pressure readings, are of African descent, or are overweight. Have your blood pressure checked: Every 3-5 years if you are 18-39 years of age. Every year if you are 40 years old or older. Diabetes Have regular diabetes screenings. This checks your fasting blood sugar level. Have the screening done: Once every three years after age 40 if you are at a normal weight and have a low risk for diabetes. More often and at a younger age if you are overweight or have a high risk for diabetes. What should I know about preventing infection? Hepatitis B If you have a higher risk for hepatitis B, you should be screened for this virus. Talk with your health care provider to find out if you are at risk for hepatitis B infection. Hepatitis C Testing is recommended for: Everyone born from 1945 through 1965. Anyone with known risk factors for hepatitis C. Sexually transmitted infections (STIs) Get screened for STIs, including gonorrhea and chlamydia, if: You are sexually active and are younger than 50 years of age. You are older than 50 years of age and your health care provider tells you that you are at risk for this type of infection. Your sexual activity has changed since you were last screened, and you are at increased risk for chlamydia or gonorrhea. Ask your health care provider if you are at risk. Ask your health care provider about whether you are at high risk for HIV. Your health care provider may recommend a prescription medicine   to help prevent HIV infection. If you choose to take medicine to prevent HIV, you should first get tested for HIV. You should then be tested every 3 months for as long as you are taking the medicine. Pregnancy If you are about to stop having your period (premenopausal) and you may become pregnant, seek counseling before you get  pregnant. Take 400 to 800 micrograms (mcg) of folic acid every day if you become pregnant. Ask for birth control (contraception) if you want to prevent pregnancy. Osteoporosis and menopause Osteoporosis is a disease in which the bones lose minerals and strength with aging. This can result in bone fractures. If you are 65 years old or older, or if you are at risk for osteoporosis and fractures, ask your health care provider if you should: Be screened for bone loss. Take a calcium or vitamin D supplement to lower your risk of fractures. Be given hormone replacement therapy (HRT) to treat symptoms of menopause. Follow these instructions at home: Lifestyle Do not use any products that contain nicotine or tobacco, such as cigarettes, e-cigarettes, and chewing tobacco. If you need help quitting, ask your health care provider. Do not use street drugs. Do not share needles. Ask your health care provider for help if you need support or information about quitting drugs. Alcohol use Do not drink alcohol if: Your health care provider tells you not to drink. You are pregnant, may be pregnant, or are planning to become pregnant. If you drink alcohol: Limit how much you use to 0-1 drink a day. Limit intake if you are breastfeeding. Be aware of how much alcohol is in your drink. In the [place] one drink equals one 12 oz bottle of beer (355 mL), one 5 oz glass of wine (148 mL), or one 1 oz glass of hard liquor (44 mL). General instructions Schedule regular health, dental, and eye exams. Stay current with your vaccines. Tell your health care provider if: You often feel depressed. You have ever been abused or do not feel safe at home. Summary Adopting a healthy lifestyle and getting preventive care are important in promoting health and wellness. Follow your health care provider's instructions about healthy diet, exercising, and getting tested or screened for diseases. Follow your health care provider's  instructions on monitoring your cholesterol and blood pressure. This information is not intended to replace advice given to you by your health care provider. Make sure you discuss any questions you have with your health care provider. Document Revised: 08/03/2020 Document Reviewed: 05/19/2018 Elsevier Patient Education  2022 [employer] Inc[employer]  

## 2021-03-07 ENCOUNTER — Other Ambulatory Visit: Payer: Self-pay

## 2021-03-07 ENCOUNTER — Other Ambulatory Visit
Admission: RE | Admit: 2021-03-07 | Discharge: 2021-03-07 | Disposition: A | Discharge status: Home / Self Care | Payer: Medicare Other | Source: Ambulatory Visit | Attending: Pulmonary Disease | Admitting: Pulmonary Disease

## 2021-03-07 ENCOUNTER — Telehealth: Payer: Self-pay | Admitting: General Surgery

## 2021-03-07 DIAGNOSIS — Z01812 Encounter for preprocedural laboratory examination: Secondary | ICD-10-CM | POA: Insufficient documentation

## 2021-03-07 DIAGNOSIS — Z20822 Contact with and (suspected) exposure to covid-19: Secondary | ICD-10-CM | POA: Diagnosis not present

## 2021-03-07 NOTE — Telephone Encounter (Signed)
Called patient to discuss molecular testing results. No answer. LM on VM for her to return my call.

## 2021-03-08 ENCOUNTER — Ambulatory Visit: Payer: Medicare Other

## 2021-03-08 LAB — SARS CORONAVIRUS 2 (TAT 6-24 HRS): SARS Coronavirus 2: NEGATIVE

## 2021-03-11 ENCOUNTER — Telehealth: Payer: Self-pay | Admitting: *Deleted

## 2021-03-11 NOTE — Telephone Encounter (Signed)
Patient called and was calling back for test results, molecular testing results. Dr. Celine Ahr called her on 03/07/21

## 2021-03-12 ENCOUNTER — Encounter: Payer: Self-pay | Admitting: General Surgery

## 2021-03-12 NOTE — Telephone Encounter (Signed)
Spoke with patient-I let her know that Dr.Pabon would be in office tomorrow and instruct [place] on what the plan of care will be per the results of the Thyroseq results.

## 2021-03-13 ENCOUNTER — Other Ambulatory Visit
Admission: RE | Admit: 2021-03-13 | Discharge: 2021-03-13 | Disposition: A | Discharge status: Home / Self Care | Payer: Medicare Other | Source: Ambulatory Visit | Attending: Pulmonary Disease | Admitting: Pulmonary Disease

## 2021-03-13 ENCOUNTER — Other Ambulatory Visit: Payer: Self-pay

## 2021-03-13 DIAGNOSIS — Z01812 Encounter for preprocedural laboratory examination: Secondary | ICD-10-CM | POA: Insufficient documentation

## 2021-03-13 DIAGNOSIS — Z20822 Contact with and (suspected) exposure to covid-19: Secondary | ICD-10-CM | POA: Insufficient documentation

## 2021-03-14 ENCOUNTER — Ambulatory Visit: Payer: Medicare Other | Attending: Pulmonary Disease

## 2021-03-14 ENCOUNTER — Telehealth: Payer: Self-pay

## 2021-03-14 DIAGNOSIS — J449 Chronic obstructive pulmonary disease, unspecified: Secondary | ICD-10-CM | POA: Diagnosis not present

## 2021-03-14 LAB — SARS CORONAVIRUS 2 (TAT 6-24 HRS): SARS Coronavirus 2: NEGATIVE

## 2021-03-14 MED ORDER — ALBUTEROL SULFATE (2.5 MG/3ML) 0.083% IN NEBU
2.5000 mg | INHALATION_SOLUTION | Freq: Once | RESPIRATORY_TRACT | Status: AC
  Administered 2021-03-14: 2.5 mg via RESPIRATORY_TRACT
  Filled 2021-03-14: qty 3

## 2021-03-14 NOTE — Telephone Encounter (Signed)
Poke with patient-scheduled appointment 04/03/21 to follow up Thyroid biopsy results per Dr.Pabon. Patient notified of appointment date/time.

## 2021-04-03 ENCOUNTER — Ambulatory Visit (INDEPENDENT_AMBULATORY_CARE_PROVIDER_SITE_OTHER): Payer: Medicare Other | Admitting: Surgery

## 2021-04-03 ENCOUNTER — Telehealth: Payer: Self-pay

## 2021-04-03 ENCOUNTER — Other Ambulatory Visit: Payer: Self-pay

## 2021-04-03 VITALS — BP 117/76 | HR 83 | Temp 98.2°F | Ht 61.0 in | Wt 193.6 lb

## 2021-04-03 DIAGNOSIS — I25118 Atherosclerotic heart disease of native coronary artery with other forms of angina pectoris: Secondary | ICD-10-CM | POA: Diagnosis not present

## 2021-04-03 DIAGNOSIS — E042 Nontoxic multinodular goiter: Secondary | ICD-10-CM

## 2021-04-03 NOTE — Patient Instructions (Signed)
Our surgery scheduler will call you within 24-48 hours to schedule your surgery. Please have the [facility] surgery sheet available when speaking with her.     Thyroidectomy, Care After This sheet gives you information about how to care for yourself after your procedure. Your health care provider may also give you more specific instructions. If you have problems or questions, contact your health care provider. What can I expect after the procedure? After the procedure, it is common to have: Mild pain in the neck or upper body, especially when swallowing. A swollen neck. A sore throat. A weak or hoarse voice. Slight tingling or numbness around your mouth, or in your fingers or toes. This may last for a day or two after surgery. This condition is caused by low levels of calcium. You may be given calcium supplements to treat it. Follow these instructions at home: Medicines Take over-the-counter and prescription medicines only as told by your health care provider. Do not drive or use heavy machinery while taking prescription pain medicine. Do not take medicines that contain aspirin and ibuprofen until your health care provider says that you can. These medicines can increase your risk of bleeding. Take a thyroid hormone medicine as recommended by your health care provider. You will have to take this medicine for the rest of your life if your entire thyroid was removed. Eating and drinking Start slowly with eating. You may need to have only liquids and soft foods for a few days or as directed by your health care provider. To prevent or treat constipation while you are taking prescription pain medicine, your health care provider may recommend that you: Drink enough fluid to keep your urine pale yellow. Take over-the-counter or prescription medicines. Eat foods that are high in fiber, such as fresh fruits and vegetables, whole grains, and beans. Limit foods that are high in fat and processed sugars, such  as fried and sweet foods. Incision care Follow instructions from your health care provider about how to take care of your incision. Make sure you: Wash your hands with soap and water before you change your bandage (dressing). If soap and water are not available, use hand sanitizer. Change your dressing as told by your health care provider. Leave stitches (sutures), skin glue, or adhesive strips in place. These skin closures may need to stay in place for 2 weeks or longer. If adhesive strip edges start to loosen and curl up, you may trim the loose edges. Do not remove adhesive strips completely unless your health care provider tells you to do that. Check your incision area every day for signs of infection. Check for: Redness, swelling, or pain. Fluid or blood. Warmth. Pus or a bad smell. Do not take baths, swim, or use a hot tub until your health care provider approves. Activity For the first 10 days after the procedure or as instructed by your health care provider: Do not lift anything that is heavier than 10 lb (4.5 kg). Do not jog, swim, or do other strenuous exercises. Do not play contact sports. Avoid sitting for a long time without moving. Get up to take short walks every 1-2 hours. This is needed to improve blood flow and breathing. Ask for help if you feel weak or unsteady. Return to your normal activities as told by your health care provider. Ask your health care provider what activities are safe for you. General instructions Do not use any products that contain nicotine or tobacco, such as cigarettes and e-cigarettes. These can  delay healing after surgery. If you need help quitting, ask your health care provider. Keep all follow-up visits as told by your health care provider. This is important. Your health care provider needs to monitor the calcium level in your blood to make sure that it does not become low. Contact a health care provider if you: Have a fever. Have more redness,  swelling, or pain around your incision area. Have fluid or blood coming from your incision area. Notice that your incision area feels warm to the touch. Have pus or a bad smell coming from your incision area. Have trouble talking. Have nausea or vomiting for more than 2 days. Get help right away if you: Have trouble breathing. Have trouble swallowing. Develop a rash. Develop a cough that gets worse. Notice that your speech changes, or you have hoarseness that gets worse. Develop numbness, tingling, or muscle spasms in the arms, hands, feet, or face. Summary After the procedure, it is common to feel mild pain in the neck or upper body, especially when swallowing. Take medicines as told by your health care provider. These include pain medicines and thyroid hormones, if required. Follow instructions from your health care provider about how to take care of your incision. Watch for signs of infection. Keep all follow-up visits as told by your health care provider. This is important. Your health care provider needs to monitor the calcium level in your blood to make sure that it does not become low. Get help right away if you develop difficulty breathing, or numbness, tingling, or muscle spasms in the arms, hands, feet, or face. This information is not intended to replace advice given to you by your health care provider. Make sure you discuss any questions you have with your health care provider. Document Revised: 02/02/2020 Document Reviewed: 02/02/2020 Elsevier Patient Education  [place].

## 2021-04-03 NOTE — Telephone Encounter (Signed)
Pulmonary clearance faxed to Dr.Carmen Patsey Berthold at this time.

## 2021-04-04 ENCOUNTER — Encounter: Payer: Self-pay | Admitting: Surgery

## 2021-04-04 ENCOUNTER — Telehealth: Payer: Self-pay | Admitting: Surgery

## 2021-04-04 NOTE — Progress Notes (Signed)
Outpatient Surgical Follow Up  04/04/2021  [facility] is an 50 y.o. female.   Chief Complaint  Patient presents with   Follow-up    Thyroid bx    HPI: [facility] is following after FNA biopsy results.  She does have history of thyroid nodules and most recently underwent an ultrasound-guided biopsy of 2 thyroid nodules 1 located in the left midportion of the thyroid on the other 1 located in the left inferior portion of the thyroid.  The first FNA biopsy that is located in the left inferior thyroid showed a low probability of cancer per [facility]. FNA biopsy showed this nodule to have an intermediate to high probability of cancer.  This was a Bethesda 3 nodule.  HRAS mutation was identified but no high risk mutations were found.  These results overall gives a 70% chance of this nodule being malignant. I had an extensive discussion with her regarding FNA results and what they mean.  She is very concerned about the left side being malignant.  She also has some thyroid nodules on the right side that were not suspicious for cancer however she does not want entertain the possibility of those nodules becoming cancerous on point time.  She also understands that thyroid cancer can be multifocal and right now she is adamant about having completion thyroidectomy.  I had an extensive discussion with her and also her friend about neck steps.  I initially suggested left hemithyroidectomy to establish a more proper diagnosis.  I also hear her concerns and given that she is very concerned of potential right side having malignant nodules in the future I do think it is reasonable to offer her completion throughout thyroidectomy.  She is aware of the potential risks that she may carry with total thyroidectomy including long life supplementation of thyroid hormone, higher chances of recurrent laryngeal nerve injury and obviously longer operative time and potential anesthesia complications. She does have a history of  CPAP and not on oxygen but we will make sure that we will obtain pulmonary clearance.  Currently she denies any fevers any chills she is able to swallow well.  She comes accompanied by her best friend. I also had an extensive conversation with them regarding Dr. Celine Ahr not being part of the practice anymore.  Also offer her the options of a second opinion at a tertiary center.  She wishes to stay local with our practice. She is currently on thyroid replacement and does have a hx of multinodular goiter. She does have a chronic cough, but this is secondary to pulmonary disease.  She denies pressure in her neck while in a supine position.  She denies any heart palpitations or hand tremors.  No changes in the texture of her hair, skin, or fingernails.  No heat or cold intolerance.  No concerns with diarrhea or constipation.  Her weight has been stable.  No ocular symptoms.  She has no known family history of thyroid cancer or other thyroid problems.  She denies any occupational or therapeutic exposure to ionizing radiation.   Past Medical History:  Diagnosis Date   Abdominal mass, RUQ (right upper quadrant)    Anxiety    Arthritis    Asthma    Bronchitis    Carpal tunnel syndrome of left wrist 10/25/2014   Chronic bronchitis, obstructive (HCC) 07/20/2017   Chronic pain    COPD (chronic obstructive pulmonary disease) (HCC)    Cyst of right kidney 03/22/2018   DDD (degenerative disc disease), lumbar  10/25/2014   Degenerative joint disease (DJD) of hip    Diarrhea of infectious origin 03/25/2018   Dysphagia    Emphysema of lung (HCC)    Facet syndrome, lumbar 10/25/2014   Fatty liver 03/25/2018   Foot fracture, left 05/02/15   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   GERD (gastroesophageal reflux disease)    Hereditary and idiopathic peripheral neuropathy 11/06/2014   IBS (irritable bowel syndrome)    Low back pain    Migraine 02/04/2016   Occipital neuralgia    Osteoarthritis of spine with radiculopathy,  lumbar region 10/25/2014   Other intervertebral disc degeneration, lumbar region    Other irritable bowel syndrome 04/20/2017   Other spondylosis with radiculopathy, lumbar region 10/25/2014   RLS (restless legs syndrome)    Sacroiliac joint dysfunction of both sides 10/25/2014   Spondylosis of lumbar spine    Urinary incontinence     Past Surgical History:  Procedure Laterality Date   APPENDECTOMY     BLADDER SURGERY     sling - Dr. Davis Gourd   CESAREAN SECTION     COLONOSCOPY WITH PROPOFOL N/A 04/11/2020   Procedure: COLONOSCOPY WITH PROPOFOL;  Surgeon: Virgel Manifold, MD;  Location: [facility];  Service: Endoscopy;  Laterality: N/A;   ESOPHAGOGASTRODUODENOSCOPY  04/26/2019   STOMACH SURGERY     TOTAL ABDOMINAL HYSTERECTOMY W/ BILATERAL SALPINGOOPHORECTOMY      Family History  Problem Relation Age of Onset   Neuropathy Mother    Arthritis Father    Hypertension Father    Leukemia Father    Heart disease Father        CAD s/p CABG   Healthy Brother    Healthy Daughter    Healthy Son    Dementia Maternal Grandmother    Hypertension Maternal Grandmother    Hypercholesterolemia Maternal Grandmother    Heart disease Maternal Grandmother    Healthy Brother    Healthy Brother    Stroke Neg Hx    Cancer Neg Hx     Social History:  reports that she has been smoking cigarettes. She has a 32.00 pack-year smoking history. She has never used smokeless tobacco. She reports that she does not currently use alcohol. She reports that she does not use drugs.  Allergies:  Allergies  Allergen Reactions   Penicillins Hives and Swelling    Has patient had a PCN reaction causing immediate rash, facial/tongue/throat swelling, SOB or lightheadedness with hypotension: Yes Has patient had a PCN reaction causing severe rash involving mucus membranes or skin necrosis: No Has patient had a PCN reaction that required hospitalization No Has patient had a PCN reaction occurring within  the last 10 years: No If all of the above answers are "NO", then may proceed with Cephalosporin use.   Acetaminophen     Due to polyps    Ciprofloxacin Itching   Aspirin Palpitations    Heart palpitations   Hydrocodone Hives and Rash   Other Rash    bandaids   Tape Rash    bandaids bandaids    Medications reviewed.  ROS Full ROS performed and is otherwise negative other than what is stated in HPI   BP 117/76   Pulse 83   Temp 98.2 F (36.8 C) (Oral)   Ht 5' 1" (1.549 m)   Wt 193 lb 9.6 oz (87.8 kg)   BMI 36.58 kg/m   Physical Exam Vitals and nursing note reviewed. Exam conducted with a chaperone present.  Constitutional:      General:  She is not in acute distress.    Appearance: Normal appearance.  Eyes:     General: No scleral icterus.       Right eye: No discharge.        Left eye: No discharge.  Neck:     Vascular: No carotid bruit.     Comments: No discrete palpable nodules, thyroid moves freely with deglution. Cardiovascular:     Rate and Rhythm: Normal rate and regular rhythm.     Pulses: Normal pulses.  Pulmonary:     Effort: Pulmonary effort is normal. No respiratory distress.     Breath sounds: Normal breath sounds. No wheezing or rhonchi.  Abdominal:     General: Abdomen is flat. There is no distension.     Palpations: Abdomen is soft. There is no mass.     Tenderness: There is no abdominal tenderness. There is no guarding or rebound.     Hernia: No hernia is present.  Musculoskeletal:        General: No swelling or tenderness. Normal range of motion.     Cervical back: Normal range of motion and neck supple. No rigidity or tenderness.  Lymphadenopathy:     Cervical: No cervical adenopathy.  Skin:    Capillary Refill: Capillary refill takes less than 2 seconds.  Neurological:     General: No focal deficit present.     Mental Status: She is alert and oriented to person, place, and time.  Psychiatric:        Mood and Affect: Mood normal.         Behavior: Behavior normal.        Thought Content: Thought content normal.        Judgment: Judgment normal.       Assessment/Plan: Ms. Anastacio is a 50-year-old female with evidence of thyroid nodule left mid location most recent FNA biopsy showed this nodule to have an intermediate to high probability of cancer.  This was a Bethesda 3 nodule.  HRAS mutation was identified but no high risk mutations were found.  These results overall gives a 70% chance of this nodule being malignant. After the patient was counseled about the biopsy results we had a lengthy conversation about options.  As I stated above I initially offer her left hemithyroidectomy to definitively establish a diagnosis.  She is very concerned about having nodules on the other side and that those nodules may become malignant.  She is adamant about having completion thyroidectomy.  I do think that this is reasonable given the patient's fevers.  Discussed with the patient detail about the procedure.  Risks, benefits and possible complications including but not limited to: Bleeding, infection, recurrent laryngeal nerve injuries, tracheal injuries vascular injuries hoarseness chronic pain potential renal interventions.  She understands and wishes to proceed. I will make sure that I do have an experience surgeon as my first assistant is surgery. Greater than 50% of the 50 minutes  visit was spent in counseling/coordination of care   Janet Spatafore, MD FACS General Surgeon  

## 2021-04-04 NOTE — H&P (View-Only) (Signed)
Outpatient Surgical Follow Up  04/04/2021  Janet Murray is an 50 y.o. female.   Chief Complaint  Patient presents with   Follow-up    Thyroid bx    HPI: [facility] is following after FNA biopsy results.  She does have history of thyroid nodules and most recently underwent an ultrasound-guided biopsy of 2 thyroid nodules 1 located in the left midportion of the thyroid on the other 1 located in the left inferior portion of the thyroid.  The first FNA biopsy that is located in the left inferior thyroid showed a low probability of cancer per [facility]. FNA biopsy showed this nodule to have an intermediate to high probability of cancer.  This was a Bethesda 3 nodule.  HRAS mutation was identified but no high risk mutations were found.  These results overall gives a 70% chance of this nodule being malignant. I had an extensive discussion with her regarding FNA results and what they mean.  She is very concerned about the left side being malignant.  She also has some thyroid nodules on the right side that were not suspicious for cancer however she does not want entertain the possibility of those nodules becoming cancerous on point time.  She also understands that thyroid cancer can be multifocal and right now she is adamant about having completion thyroidectomy.  I had an extensive discussion with her and also her friend about neck steps.  I initially suggested left hemithyroidectomy to establish a more proper diagnosis.  I also hear her concerns and given that she is very concerned of potential right side having malignant nodules in the future I do think it is reasonable to offer her completion throughout thyroidectomy.  She is aware of the potential risks that she may carry with total thyroidectomy including long life supplementation of thyroid hormone, higher chances of recurrent laryngeal nerve injury and obviously longer operative time and potential anesthesia complications. She does have a history of  CPAP and not on oxygen but we will make sure that we will obtain pulmonary clearance.  Currently she denies any fevers any chills she is able to swallow well.  She comes accompanied by her best friend. I also had an extensive conversation with them regarding Dr. Celine Murray not being part of the practice anymore.  Also offer her the options of a second opinion at a tertiary center.  She wishes to stay local with our practice. She is currently on thyroid replacement and does have a hx of multinodular goiter. She does have a chronic cough, but this is secondary to pulmonary disease.  She denies pressure in her neck while in a supine position.  She denies any heart palpitations or hand tremors.  No changes in the texture of her hair, skin, or fingernails.  No heat or cold intolerance.  No concerns with diarrhea or constipation.  Her weight has been stable.  No ocular symptoms.  She has no known family history of thyroid cancer or other thyroid problems.  She denies any occupational or therapeutic exposure to ionizing radiation.   Past Medical History:  Diagnosis Date   Abdominal mass, RUQ (right upper quadrant)    Anxiety    Arthritis    Asthma    Bronchitis    Carpal tunnel syndrome of left wrist 10/25/2014   Chronic bronchitis, obstructive (HCC) 07/20/2017   Chronic pain    COPD (chronic obstructive pulmonary disease) (HCC)    Cyst of right kidney 03/22/2018   DDD (degenerative disc disease), lumbar  10/25/2014   Degenerative joint disease (DJD) of hip    Diarrhea of infectious origin 03/25/2018   Dysphagia    Emphysema of lung (HCC)    Facet syndrome, lumbar 10/25/2014   Fatty liver 03/25/2018   Foot fracture, left 05/02/15   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   GERD (gastroesophageal reflux disease)    Hereditary and idiopathic peripheral neuropathy 11/06/2014   IBS (irritable bowel syndrome)    Low back pain    Migraine 02/04/2016   Occipital neuralgia    Osteoarthritis of spine with radiculopathy,  lumbar region 10/25/2014   Other intervertebral disc degeneration, lumbar region    Other irritable bowel syndrome 04/20/2017   Other spondylosis with radiculopathy, lumbar region 10/25/2014   RLS (restless legs syndrome)    Sacroiliac joint dysfunction of both sides 10/25/2014   Spondylosis of lumbar spine    Urinary incontinence     Past Surgical History:  Procedure Laterality Date   APPENDECTOMY     BLADDER SURGERY     sling - [facility]   CESAREAN SECTION     COLONOSCOPY WITH PROPOFOL N/A 04/11/2020   Procedure: COLONOSCOPY WITH PROPOFOL;  Surgeon: Janet Manifold, MD;  Location: [facility];  Service: [facility];  Laterality: N/A;   ESOPHAGOGASTRODUODENOSCOPY  04/26/2019   STOMACH SURGERY     TOTAL ABDOMINAL HYSTERECTOMY W/ BILATERAL SALPINGOOPHORECTOMY      Family History  Problem Relation Age of Onset   Neuropathy Mother    Arthritis Father    Hypertension Father    Leukemia Father    Heart disease Father        CAD s/p CABG   Healthy Brother    Healthy Daughter    Healthy Son    Dementia Maternal Grandmother    Hypertension Maternal Grandmother    Hypercholesterolemia Maternal Grandmother    Heart disease Maternal Grandmother    Healthy Brother    Healthy Brother    Stroke Neg Hx    Cancer Neg Hx     Social History:  reports that she has been smoking cigarettes. She has a 32.00 pack-year smoking history. She has never used smokeless tobacco. She reports that she does not currently use alcohol. She reports that she does not use drugs.  Allergies:  Allergies  Allergen Reactions   Penicillins Hives and Swelling    Has patient had a PCN reaction causing immediate rash, facial/tongue/throat swelling, SOB or lightheadedness with hypotension: Yes Has patient had a PCN reaction causing severe rash involving mucus membranes or skin necrosis: No Has patient had a PCN reaction that required hospitalization No Has patient had a PCN reaction occurring within  the last 10 years: No If all of the above answers are "NO", then may proceed with Cephalosporin use.   Acetaminophen     Due to polyps    Ciprofloxacin Itching   Aspirin Palpitations    Heart palpitations   Hydrocodone Hives and Rash   Other Rash    bandaids   Tape Rash    bandaids bandaids    Medications reviewed.  ROS Full ROS performed and is otherwise negative other than what is stated in HPI   BP 117/76   Pulse 83   Temp 98.2 F (36.8 C) (Oral)   Ht 5' 1" (1.549 m)   Wt 193 lb 9.6 oz (87.8 kg)   BMI 36.58 kg/m   Physical Exam Vitals and nursing note reviewed. Exam conducted with a chaperone present.  Constitutional:      General:  She is not in acute distress.    Appearance: Normal appearance.  Eyes:     General: No scleral icterus.       Right eye: No discharge.        Left eye: No discharge.  Neck:     Vascular: No carotid bruit.     Comments: No discrete palpable nodules, thyroid moves freely with deglution. Cardiovascular:     Rate and Rhythm: Normal rate and regular rhythm.     Pulses: Normal pulses.  Pulmonary:     Effort: Pulmonary effort is normal. No respiratory distress.     Breath sounds: Normal breath sounds. No wheezing or rhonchi.  Abdominal:     General: Abdomen is flat. There is no distension.     Palpations: Abdomen is soft. There is no mass.     Tenderness: There is no abdominal tenderness. There is no guarding or rebound.     Hernia: No hernia is present.  Musculoskeletal:        General: No swelling or tenderness. Normal range of motion.     Cervical back: Normal range of motion and neck supple. No rigidity or tenderness.  Lymphadenopathy:     Cervical: No cervical adenopathy.  Skin:    Capillary Refill: Capillary refill takes less than 2 seconds.  Neurological:     General: No focal deficit present.     Mental Status: She is alert and oriented to person, place, and time.  Psychiatric:        Mood and Affect: Mood normal.         Behavior: Behavior normal.        Thought Content: Thought content normal.        Judgment: Judgment normal.       Assessment/Plan: Ms. Biegler is a 50 year old female with evidence of thyroid nodule left mid location most recent FNA biopsy showed this nodule to have an intermediate to high probability of cancer.  This was a Bethesda 3 nodule.  HRAS mutation was identified but no high risk mutations were found.  These results overall gives a 70% chance of this nodule being malignant. After the patient was counseled about the biopsy results we had a lengthy conversation about options.  As I stated above I initially offer her left hemithyroidectomy to definitively establish a diagnosis.  She is very concerned about having nodules on the other side and that those nodules may become malignant.  She is adamant about having completion thyroidectomy.  I do think that this is reasonable given the patient's fevers.  Discussed with the patient detail about the procedure.  Risks, benefits and possible complications including but not limited to: Bleeding, infection, recurrent laryngeal nerve injuries, tracheal injuries vascular injuries hoarseness chronic pain potential renal interventions.  She understands and wishes to proceed. I will make sure that I do have an experience surgeon as my first assistant is surgery. Greater than 50% of the 50 minutes  visit was spent in counseling/coordination of care   Caroleen Hamman, MD [facility] Surgeon

## 2021-04-04 NOTE — Telephone Encounter (Signed)
Outgoing call is made, left message for patient to call.  Please inform patient of the following:  Pt has been advised of Pre-Admission date/time, COVID Testing date and Surgery date.  Surgery Date: 04/12/21 Preadmission Testing Date: 04/09/21 (phone 1p-5p) Covid Testing Date: 04/10/21 @ 8:25 am - patient advised to go to the [facility] between 8a-12:00 pm   Also patient will need to call at [phone], between 1-3:00pm the day before surgery, to find out what time to arrive for surgery.

## 2021-04-05 ENCOUNTER — Telehealth: Payer: Self-pay

## 2021-04-05 NOTE — Telephone Encounter (Signed)
Surgical Clearance form signed by Dr Patsey Berthold and faxed over to [facility]

## 2021-04-05 NOTE — Telephone Encounter (Signed)
Outgoing call is made again, left message for patient to call me.  Also called her mom's number.  Mom will try to get in touch with her as well and have her to call.

## 2021-04-08 NOTE — Progress Notes (Unsigned)
Pulmonary Clearance received from Dr Domingo Dimes office. The patient is cleared at Moderate to High risk for surgery. This risk cannot be further reduced.

## 2021-04-08 NOTE — Telephone Encounter (Signed)
Received call back from patient. She is now aware of all dates regarding her surgery and verbalized understanding.   Also spoke with patient that it was very hard to get in touch with her, requested for patient to keep her phone close by.

## 2021-04-09 ENCOUNTER — Other Ambulatory Visit
Admission: RE | Admit: 2021-04-09 | Discharge: 2021-04-09 | Disposition: A | Discharge status: Home / Self Care | Payer: Medicare Other | Source: Ambulatory Visit | Attending: Surgery | Admitting: Surgery

## 2021-04-09 ENCOUNTER — Other Ambulatory Visit: Payer: Self-pay

## 2021-04-09 DIAGNOSIS — J439 Emphysema, unspecified: Secondary | ICD-10-CM

## 2021-04-09 DIAGNOSIS — I2510 Atherosclerotic heart disease of native coronary artery without angina pectoris: Secondary | ICD-10-CM

## 2021-04-09 HISTORY — DX: Atherosclerotic heart disease of native coronary artery without angina pectoris: I25.10

## 2021-04-09 HISTORY — DX: Hypothyroidism, unspecified: E03.9

## 2021-04-09 HISTORY — DX: Dyspnea, unspecified: R06.00

## 2021-04-09 NOTE — Patient Instructions (Addendum)
Your procedure is scheduled on: 04/12/21 - Friday Report to the Registration Desk on the 1st floor of the [facility]. To find out your arrival time, please call [phone] between 1PM - 3PM on: 04/11/21 - Thursday  Report to [facility] on 04/09/21 at 1030 am for Labs/EKG/Covid.  REMEMBER: Instructions that are not followed completely may result in serious medical risk, up to and including death; or upon the discretion of your surgeon and anesthesiologist your surgery may need to be rescheduled.  Do not eat food after midnight the night before surgery.  No gum chewing, lozengers or hard candies.  You may however, drink CLEAR liquids up to 2 hours before you are scheduled to arrive for your surgery. Do not drink anything within 2 hours of your scheduled arrival time. You may drink :  - water - apple juice - no cider - Gatorade - no red - Black coffee with sweeter- no milk or creamer - Tea with sweeter   I TAKE THESE MEDICATIONS THE MORNING OF SURGERY WITH A SIP OF WATER:  - albuterol (PROVENTIL) (2.5 MG/3ML) 0.083% nebulizer solution - levothyroxine (SYNTHROID) 75 MCG tablet - tiZANidine (ZANAFLEX) 2 MG tablet - esomeprazole (NEXIUM) 40 MG capsule, (take one the night before and one on the morning of surgery - helps to prevent nausea after surgery.) - atorvastatin (LIPITOR) 80 MG tablet - BEVESPI AEROSPHERE 9-4.8 MCG/ACT AERO  Use VENTOLIN HFA 108 (90 Base) MCG/ACT inhaler on the day of surgery and bring to the hospital.  One week prior to surgery: Stop Anti-inflammatories (NSAIDS) such as Advil, Aleve, Ibuprofen, Motrin, Naproxen, Naprosyn and Aspirin based products such as Excedrin, Goodys Powder, BC Powder.  Stop ANY OVER THE COUNTER supplements until after surgery.  No Alcohol for 24 hours before or after surgery.  No Smoking including e-cigarettes for 24 hours prior to surgery.  No chewable tobacco products for at least 6 hours prior to surgery.  No nicotine  patches on the day of surgery.  Do not use any "recreational" drugs for at least a week prior to your surgery.  Please be advised that the combination of cocaine and anesthesia may have negative outcomes, up to and including death. If you test positive for cocaine, your surgery will be cancelled.  On the morning of surgery brush your teeth with toothpaste and water, you may rinse your mouth with mouthwash if you wish. Do not swallow any toothpaste or mouthwash.  Use CHG Soap or wipes as directed on instruction sheet.  Do not wear jewelry, make-up, hairpins, clips or nail polish.  Do not wear lotions, powders, or perfumes.   Do not shave body from the neck down 48 hours prior to surgery just in case you cut yourself which could leave a site for infection.  Also, freshly shaved skin may become irritated if using the CHG soap.  Contact lenses, hearing aids and dentures may not be worn into surgery.  Do not bring valuables to the hospital. [facility] is not responsible for any missing/lost belongings or valuables.   Notify your doctor if there is any change in your medical condition (cold, fever, infection).  Wear comfortable clothing (specific to your surgery type) to the hospital.  After surgery, you can help prevent lung complications by doing breathing exercises.  Take deep breaths and cough every 1-2 hours. Your doctor may order a device called an Incentive Spirometer to help you take deep breaths. When coughing or sneezing, hold a pillow firmly against your  incision with both hands. This is called "splinting." Doing this helps protect your incision. It also decreases belly discomfort.  If you are being admitted to the hospital overnight, leave your suitcase in the car. After surgery it may be brought to your room.  If you are being discharged the day of surgery, you will not be allowed to drive home. You will need a responsible adult (18 years or older) to drive you home and stay  with you that night.   If you are taking public transportation, you will need to have a responsible adult (18 years or older) with you. Please confirm with your physician that it is acceptable to use public transportation.   Please call the [facility] at [phone] if you have any questions about these instructions.  Surgery Visitation Policy:  Patients undergoing a surgery or procedure may have one family member or support person with them as long as that person is not COVID-19 positive or experiencing its symptoms.  That person may remain in the waiting area during the procedure and may rotate out with other people.  Inpatient Visitation:    Visiting hours are 7 a.m. to 8 p.m. Up to two visitors ages 16+ are allowed at one time in a patient room. The visitors may rotate out with other people during the day. Visitors must check out when they leave, or other visitors will not be allowed. One designated support person may remain overnight. The visitor must pass COVID-19 screenings, use hand sanitizer when entering and exiting the patient's room and wear a mask at all times, including in the patient's room. Patients must also wear a mask when staff or their visitor are in the room. Masking is required regardless of vaccination status.

## 2021-04-10 ENCOUNTER — Other Ambulatory Visit
Admission: RE | Admit: 2021-04-10 | Discharge: 2021-04-10 | Disposition: A | Discharge status: Home / Self Care | Payer: Medicare Other | Source: Ambulatory Visit | Attending: Surgery | Admitting: Surgery

## 2021-04-10 DIAGNOSIS — Z01818 Encounter for other preprocedural examination: Secondary | ICD-10-CM | POA: Insufficient documentation

## 2021-04-10 DIAGNOSIS — Z880 Allergy status to penicillin: Secondary | ICD-10-CM | POA: Diagnosis not present

## 2021-04-10 DIAGNOSIS — E041 Nontoxic single thyroid nodule: Secondary | ICD-10-CM | POA: Diagnosis not present

## 2021-04-10 DIAGNOSIS — J449 Chronic obstructive pulmonary disease, unspecified: Secondary | ICD-10-CM | POA: Diagnosis not present

## 2021-04-10 DIAGNOSIS — I2510 Atherosclerotic heart disease of native coronary artery without angina pectoris: Secondary | ICD-10-CM

## 2021-04-10 DIAGNOSIS — J439 Emphysema, unspecified: Secondary | ICD-10-CM

## 2021-04-10 DIAGNOSIS — Z6836 Body mass index (BMI) 36.0-36.9, adult: Secondary | ICD-10-CM | POA: Diagnosis not present

## 2021-04-10 DIAGNOSIS — Z20822 Contact with and (suspected) exposure to covid-19: Secondary | ICD-10-CM

## 2021-04-10 DIAGNOSIS — F17210 Nicotine dependence, cigarettes, uncomplicated: Secondary | ICD-10-CM | POA: Diagnosis not present

## 2021-04-10 DIAGNOSIS — E669 Obesity, unspecified: Secondary | ICD-10-CM | POA: Diagnosis not present

## 2021-04-10 LAB — SARS CORONAVIRUS 2 (TAT 6-24 HRS): SARS Coronavirus 2: NEGATIVE

## 2021-04-10 LAB — CBC
HCT: 42.7 % (ref 36.0–46.0)
Hemoglobin: 14.2 g/dL (ref 12.0–15.0)
MCH: 33.1 pg (ref 26.0–34.0)
MCHC: 33.3 g/dL (ref 30.0–36.0)
MCV: 99.5 fL (ref 80.0–100.0)
Platelets: UNDETERMINED 10*3/uL (ref 150–400)
RBC: 4.29 MIL/uL (ref 3.87–5.11)
RDW: 12.8 % (ref 11.5–15.5)
WBC: 5.4 10*3/uL (ref 4.0–10.5)
nRBC: 0 % (ref 0.0–0.2)

## 2021-04-10 LAB — BASIC METABOLIC PANEL
Anion gap: 8 (ref 5–15)
BUN: 9 mg/dL (ref 6–20)
CO2: 29 mmol/L (ref 22–32)
Calcium: 9 mg/dL (ref 8.9–10.3)
Chloride: 102 mmol/L (ref 98–111)
Creatinine, Ser: 0.68 mg/dL (ref 0.44–1.00)
GFR, Estimated: >60 mL/min (ref >60)
Glucose, Bld: 137 mg/dL — ABNORMAL HIGH (ref 70–99)
Potassium: 4 mmol/L (ref 3.5–5.1)
Sodium: 139 mmol/L (ref 135–145)

## 2021-04-10 LAB — TYPE AND SCREEN
ABO/RH(D): O NEG
Antibody Screen: NEGATIVE

## 2021-04-10 NOTE — Progress Notes (Signed)
BP 138/73   Pulse 84   Temp 99.1 F (37.3 C) (Oral)   Ht 5' 1.5" (1.562 m)   Wt 192 lb 3.2 oz (87.2 kg)   SpO2 98%   BMI 35.73 kg/m    Subjective:    Patient ID: Janet Murray, female    DOB: Jun 23, 1970, 50 y.o.   MRN: [id number]  HPI: Janet Murray is a 50 y.o. female  Chief Complaint  Patient presents with   Anxiety   Hyperlipidemia   Hypertension   COPD COPD status: controlled Satisfied with current treatment?: yes Oxygen use: no Dyspnea frequency:  Cough frequency:  Rescue inhaler frequency:   Limitation of activity: no Productive cough:  Last Spirometry:  Pneumovax: Not up to Date Influenza: Not up to Date  HYPOTHYROIDISM Patient is having thyroid and nodules removed tomorrow.  Will readdress after patient has thyroidectomy.    Denies HA, CP, SOB, dizziness, palpitations, visual changes, and lower extremity swelling.  Relevant past medical, surgical, family and social history reviewed and updated as indicated. Interim medical history since our last visit reviewed. Allergies and medications reviewed and updated.  Review of Systems  Constitutional:  Negative for fever and unexpected weight change.  Eyes:  Negative for visual disturbance.  Respiratory:  Negative for cough, chest tightness and shortness of breath.   Cardiovascular:  Negative for chest pain, palpitations and leg swelling.  Endocrine: Negative for cold intolerance.  Neurological:  Negative for dizziness and headaches.   Per HPI unless specifically indicated above     Objective:    BP 138/73   Pulse 84   Temp 99.1 F (37.3 C) (Oral)   Ht 5' 1.5" (1.562 m)   Wt 192 lb 3.2 oz (87.2 kg)   SpO2 98%   BMI 35.73 kg/m   Wt Readings from Last 3 Encounters:  04/11/21 192 lb 3.2 oz (87.2 kg)  04/03/21 193 lb 9.6 oz (87.8 kg)  02/27/21 190 lb (86.2 kg)    Physical Exam Vitals and nursing note reviewed.  Constitutional:      General: She is not in acute distress.    Appearance: Normal  appearance. She is normal weight. She is not ill-appearing, toxic-appearing or diaphoretic.  HENT:     Head: Normocephalic.     Right Ear: External ear normal.     Left Ear: External ear normal.     Nose: Nose normal.     Mouth/Throat:     Mouth: Mucous membranes are moist.     Pharynx: Oropharynx is clear.  Eyes:     General:        Right eye: No discharge.        Left eye: No discharge.     Extraocular Movements: Extraocular movements intact.     Conjunctiva/sclera: Conjunctivae normal.     Pupils: Pupils are equal, round, and reactive to light.  Cardiovascular:     Rate and Rhythm: Normal rate and regular rhythm.     Heart sounds: No murmur heard. Pulmonary:     Effort: Pulmonary effort is normal. No respiratory distress.     Breath sounds: Normal breath sounds. No wheezing or rales.  Musculoskeletal:     Cervical back: Normal range of motion and neck supple.  Skin:    General: Skin is warm and dry.     Capillary Refill: Capillary refill takes less than 2 seconds.  Neurological:     General: No focal deficit present.     Mental Status: She  is alert and oriented to person, place, and time. Mental status is at baseline.  Psychiatric:        Mood and Affect: Mood normal.        Behavior: Behavior normal.        Thought Content: Thought content normal.        Judgment: Judgment normal.    Results for orders placed or performed during the hospital encounter of 04/10/21  SARS CORONAVIRUS 2 (TAT 6-24 HRS) Nasopharyngeal Nasopharyngeal Swab   Specimen: Nasopharyngeal Swab  Result Value Ref Range   SARS Coronavirus 2 NEGATIVE NEGATIVE  CBC  Result Value Ref Range   WBC 5.4 4.0 - 10.5 K/uL   RBC 4.29 3.87 - 5.11 MIL/uL   Hemoglobin 14.2 12.0 - 15.0 g/dL   HCT 42.7 36.0 - 46.0 %   MCV 99.5 80.0 - 100.0 fL   MCH 33.1 26.0 - 34.0 pg   MCHC 33.3 30.0 - 36.0 g/dL   RDW 12.8 11.5 - 15.5 %   Platelets PLATELET CLUMPS NOTED ON SMEAR, UNABLE TO ESTIMATE 150 - 400 K/uL   nRBC 0.0  0.0 - 0.2 %  Basic metabolic panel  Result Value Ref Range   Sodium 139 135 - 145 mmol/L   Potassium 4.0 3.5 - 5.1 mmol/L   Chloride 102 98 - 111 mmol/L   CO2 29 22 - 32 mmol/L   Glucose, Bld 137 (H) 70 - 99 mg/dL   BUN 9 6 - 20 mg/dL   Creatinine, Ser 0.68 0.44 - 1.00 mg/dL   Calcium 9.0 8.9 - 10.3 mg/dL   GFR, Estimated >60 >60 mL/min   Anion gap 8 5 - 15  Type and screen [place]  Result Value Ref Range   ABO/RH(D) O NEG    Antibody Screen NEG    Sample Expiration 04/24/2021,2359    Extend sample reason      NO TRANSFUSIONS OR PREGNANCY IN THE PAST 3 MONTHS Performed at [facility], [address], [place], [place] [address]       Assessment & Plan:   Problem List Items Addressed This Visit       Respiratory   COPD (chronic obstructive pulmonary disease) with emphysema ([place]) - Primary   Relevant Medications   nicotine (NICOTROL) 10 MG inhaler     Endocrine   Acquired hypothyroidism    Patient has planned thyroidectomy scheduled for tomorrow 04/12/2021.  Will follow up after surgery.         Other   Tobacco abuse    Patient is current everyday smoker. She will be in the hospital for a couple of days and not able to smoke would like to continue smoking cessation once discharged from the hospital. Medication sent in to the pharmacy. Discussed proper use of medication. Follow up in 2 months.        Follow up plan: Return in about 2 months (around 06/11/2021) for COPD and Hypothyroidism.   A total of 30 minutes were spent on this encounter today.  When total time is documented, this includes both the face-to-face and non-face-to-face time personally spent before, during and after the visit on the date of the encounter discussing smoking cessation.

## 2021-04-11 ENCOUNTER — Other Ambulatory Visit: Payer: Self-pay

## 2021-04-11 ENCOUNTER — Ambulatory Visit (INDEPENDENT_AMBULATORY_CARE_PROVIDER_SITE_OTHER): Payer: Medicare Other | Admitting: Nurse Practitioner

## 2021-04-11 ENCOUNTER — Encounter: Payer: Self-pay | Admitting: Nurse Practitioner

## 2021-04-11 VITALS — BP 138/73 | HR 84 | Temp 99.1°F | Ht 61.5 in | Wt 192.2 lb

## 2021-04-11 DIAGNOSIS — E039 Hypothyroidism, unspecified: Secondary | ICD-10-CM | POA: Diagnosis not present

## 2021-04-11 DIAGNOSIS — J439 Emphysema, unspecified: Secondary | ICD-10-CM

## 2021-04-11 DIAGNOSIS — Z720 Tobacco use: Secondary | ICD-10-CM | POA: Diagnosis not present

## 2021-04-11 DIAGNOSIS — I2510 Atherosclerotic heart disease of native coronary artery without angina pectoris: Secondary | ICD-10-CM

## 2021-04-11 DIAGNOSIS — D696 Thrombocytopenia, unspecified: Secondary | ICD-10-CM

## 2021-04-11 DIAGNOSIS — R32 Unspecified urinary incontinence: Secondary | ICD-10-CM

## 2021-04-11 MED ORDER — NICOTINE 10 MG IN INHA: 1.0000 | RESPIRATORY_TRACT | 1 refills | Status: DC | PRN

## 2021-04-11 NOTE — Assessment & Plan Note (Signed)
Patient is current everyday smoker. She will be in the hospital for a couple of days and not able to smoke would like to continue smoking cessation once discharged from the hospital. Medication sent in to the pharmacy. Discussed proper use of medication. Follow up in 2 months.

## 2021-04-11 NOTE — Assessment & Plan Note (Signed)
Patient has planned thyroidectomy scheduled for tomorrow 04/12/2021.  Will follow up after surgery.

## 2021-04-12 ENCOUNTER — Ambulatory Visit: Payer: Medicare Other | Admitting: Registered Nurse

## 2021-04-12 ENCOUNTER — Encounter
Admission: RE | Disposition: A | Discharge status: Home / Self Care | Payer: Self-pay | Source: Home / Self Care | Attending: Surgery

## 2021-04-12 ENCOUNTER — Inpatient Hospital Stay
Admission: RE | Admit: 2021-04-12 | Discharge: 2021-04-13 | DRG: 627 | Disposition: A | Discharge status: Home / Self Care | Payer: Medicare Other | Attending: Surgery | Admitting: Surgery

## 2021-04-12 ENCOUNTER — Encounter: Payer: Self-pay | Admitting: Surgery

## 2021-04-12 DIAGNOSIS — E041 Nontoxic single thyroid nodule: Secondary | ICD-10-CM | POA: Diagnosis present

## 2021-04-12 DIAGNOSIS — E669 Obesity, unspecified: Secondary | ICD-10-CM | POA: Diagnosis present

## 2021-04-12 DIAGNOSIS — F17210 Nicotine dependence, cigarettes, uncomplicated: Secondary | ICD-10-CM | POA: Diagnosis present

## 2021-04-12 DIAGNOSIS — Z6836 Body mass index (BMI) 36.0-36.9, adult: Secondary | ICD-10-CM | POA: Diagnosis not present

## 2021-04-12 DIAGNOSIS — J449 Chronic obstructive pulmonary disease, unspecified: Secondary | ICD-10-CM | POA: Diagnosis not present

## 2021-04-12 DIAGNOSIS — C801 Malignant (primary) neoplasm, unspecified: Secondary | ICD-10-CM

## 2021-04-12 DIAGNOSIS — Z20822 Contact with and (suspected) exposure to covid-19: Secondary | ICD-10-CM | POA: Diagnosis not present

## 2021-04-12 DIAGNOSIS — C73 Malignant neoplasm of thyroid gland: Secondary | ICD-10-CM | POA: Diagnosis not present

## 2021-04-12 DIAGNOSIS — Z880 Allergy status to penicillin: Secondary | ICD-10-CM

## 2021-04-12 DIAGNOSIS — E042 Nontoxic multinodular goiter: Secondary | ICD-10-CM

## 2021-04-12 DIAGNOSIS — E890 Postprocedural hypothyroidism: Secondary | ICD-10-CM

## 2021-04-12 HISTORY — PX: THYROIDECTOMY: SHX17

## 2021-04-12 HISTORY — DX: Malignant (primary) neoplasm, unspecified: C80.1

## 2021-04-12 LAB — BASIC METABOLIC PANEL
Anion gap: 8 (ref 5–15)
BUN: 8 mg/dL (ref 6–20)
CO2: 26 mmol/L (ref 22–32)
Calcium: 8.6 mg/dL — ABNORMAL LOW (ref 8.9–10.3)
Chloride: 103 mmol/L (ref 98–111)
Creatinine, Ser: 0.48 mg/dL (ref 0.44–1.00)
GFR, Estimated: >60 mL/min (ref >60)
Glucose, Bld: 140 mg/dL — ABNORMAL HIGH (ref 70–99)
Potassium: 3.8 mmol/L (ref 3.5–5.1)
Sodium: 137 mmol/L (ref 135–145)

## 2021-04-12 LAB — ABO/RH: ABO/RH(D): O NEG

## 2021-04-12 LAB — CBC
HCT: 39.7 % (ref 36.0–46.0)
Hemoglobin: 13.3 g/dL (ref 12.0–15.0)
MCH: 33.2 pg (ref 26.0–34.0)
MCHC: 33.5 g/dL (ref 30.0–36.0)
MCV: 99 fL (ref 80.0–100.0)
Platelets: 161 10*3/uL (ref 150–400)
RBC: 4.01 MIL/uL (ref 3.87–5.11)
RDW: 12.8 % (ref 11.5–15.5)
WBC: 13.9 10*3/uL — ABNORMAL HIGH (ref 4.0–10.5)
nRBC: 0 % (ref 0.0–0.2)

## 2021-04-12 SURGERY — THYROIDECTOMY
Anesthesia: General

## 2021-04-12 MED ORDER — CHLORHEXIDINE GLUCONATE 0.12 % MT SOLN: 15.0000 mL | Freq: Once | OROMUCOSAL | Status: AC

## 2021-04-12 MED ORDER — CEFAZOLIN SODIUM-DEXTROSE 2-4 GM/100ML-% IV SOLN
2.0000 g | INTRAVENOUS | Status: AC
  Administered 2021-04-12: 2 g via INTRAVENOUS

## 2021-04-12 MED ORDER — HYDROXYZINE HCL 25 MG PO TABS
25.0000 mg | ORAL_TABLET | Freq: Three times a day (TID) | ORAL | Status: DC | PRN
  Administered 2021-04-13: 25 mg via ORAL
  Filled 2021-04-12: qty 1

## 2021-04-12 MED ORDER — ALBUTEROL SULFATE HFA 108 (90 BASE) MCG/ACT IN AERS
INHALATION_SPRAY | RESPIRATORY_TRACT | Status: DC | PRN
  Administered 2021-04-12: 4 via RESPIRATORY_TRACT

## 2021-04-12 MED ORDER — DEXAMETHASONE SODIUM PHOSPHATE 10 MG/ML IJ SOLN
INTRAMUSCULAR | Status: DC | PRN
  Administered 2021-04-12: 4 mg via INTRAVENOUS

## 2021-04-12 MED ORDER — SUCCINYLCHOLINE CHLORIDE 200 MG/10ML IV SOSY
PREFILLED_SYRINGE | INTRAVENOUS | Status: DC | PRN
  Administered 2021-04-12: 100 mg via INTRAVENOUS

## 2021-04-12 MED ORDER — FENTANYL CITRATE (PF) 100 MCG/2ML IJ SOLN: 25.0000 ug | INTRAMUSCULAR | Status: DC | PRN

## 2021-04-12 MED ORDER — DEXMEDETOMIDINE (PRECEDEX) IN NS 20 MCG/5ML (4 MCG/ML) IV SYRINGE
PREFILLED_SYRINGE | INTRAVENOUS | Status: DC | PRN
  Administered 2021-04-12 (×2): 8 ug via INTRAVENOUS
  Administered 2021-04-12: 12 ug via INTRAVENOUS
  Administered 2021-04-12 (×3): 4 ug via INTRAVENOUS

## 2021-04-12 MED ORDER — ORAL CARE MOUTH RINSE: 15.0000 mL | Freq: Once | OROMUCOSAL | Status: AC

## 2021-04-12 MED ORDER — KETAMINE HCL 50 MG/5ML IJ SOSY
PREFILLED_SYRINGE | INTRAMUSCULAR | Status: AC
  Filled 2021-04-12: qty 5

## 2021-04-12 MED ORDER — GABAPENTIN 300 MG PO CAPS: 300.0000 mg | ORAL_CAPSULE | ORAL | Status: AC

## 2021-04-12 MED ORDER — LACTATED RINGERS IV SOLN: INTRAVENOUS | Status: DC

## 2021-04-12 MED ORDER — MIDAZOLAM HCL 2 MG/2ML IJ SOLN
INTRAMUSCULAR | Status: DC | PRN
Start: 2021-04-12 — End: 2021-04-12
  Administered 2021-04-12: 2 mg via INTRAVENOUS

## 2021-04-12 MED ORDER — DEXMEDETOMIDINE (PRECEDEX) IN NS 20 MCG/5ML (4 MCG/ML) IV SYRINGE
PREFILLED_SYRINGE | INTRAVENOUS | Status: AC
  Filled 2021-04-12: qty 5

## 2021-04-12 MED ORDER — FENTANYL CITRATE (PF) 100 MCG/2ML IJ SOLN
INTRAMUSCULAR | Status: DC | PRN
  Administered 2021-04-12 (×2): 50 ug via INTRAVENOUS

## 2021-04-12 MED ORDER — CHLORHEXIDINE GLUCONATE CLOTH 2 % EX PADS: 6.0000 | MEDICATED_PAD | Freq: Once | CUTANEOUS | Status: DC

## 2021-04-12 MED ORDER — PROCHLORPERAZINE MALEATE 10 MG PO TABS
10.0000 mg | ORAL_TABLET | Freq: Four times a day (QID) | ORAL | Status: DC | PRN
  Filled 2021-04-12: qty 1

## 2021-04-12 MED ORDER — UMECLIDINIUM BROMIDE 62.5 MCG/ACT IN AEPB
1.0000 | INHALATION_SPRAY | Freq: Every day | RESPIRATORY_TRACT | Status: DC
  Administered 2021-04-13: 1 via RESPIRATORY_TRACT
  Filled 2021-04-12: qty 7

## 2021-04-12 MED ORDER — ONDANSETRON HCL 4 MG/2ML IJ SOLN
INTRAMUSCULAR | Status: DC | PRN
  Administered 2021-04-12: 4 mg via INTRAVENOUS

## 2021-04-12 MED ORDER — ROCURONIUM BROMIDE 10 MG/ML (PF) SYRINGE
PREFILLED_SYRINGE | INTRAVENOUS | Status: AC
  Filled 2021-04-12: qty 10

## 2021-04-12 MED ORDER — PROPOFOL 10 MG/ML IV BOLUS
INTRAVENOUS | Status: DC | PRN
  Administered 2021-04-12: 50 mg via INTRAVENOUS
  Administered 2021-04-12: 60 mg via INTRAVENOUS
  Administered 2021-04-12: 150 mg via INTRAVENOUS
  Administered 2021-04-12: 40 mg via INTRAVENOUS
  Administered 2021-04-12: 30 mg via INTRAVENOUS

## 2021-04-12 MED ORDER — ONDANSETRON HCL 4 MG/2ML IJ SOLN
INTRAMUSCULAR | Status: AC
  Filled 2021-04-12: qty 2

## 2021-04-12 MED ORDER — ROCURONIUM BROMIDE 100 MG/10ML IV SOLN
INTRAVENOUS | Status: DC | PRN
  Administered 2021-04-12: 5 mg via INTRAVENOUS

## 2021-04-12 MED ORDER — KETOROLAC TROMETHAMINE 15 MG/ML IJ SOLN
15.0000 mg | Freq: Four times a day (QID) | INTRAMUSCULAR | Status: DC | PRN
  Administered 2021-04-12 – 2021-04-13 (×2): 15 mg via INTRAVENOUS
  Filled 2021-04-12 (×3): qty 1

## 2021-04-12 MED ORDER — MIDAZOLAM HCL 2 MG/2ML IJ SOLN
INTRAMUSCULAR | Status: AC
  Filled 2021-04-12: qty 2

## 2021-04-12 MED ORDER — PROPOFOL 10 MG/ML IV BOLUS
INTRAVENOUS | Status: AC
  Filled 2021-04-12: qty 20

## 2021-04-12 MED ORDER — ARFORMOTEROL TARTRATE 15 MCG/2ML IN NEBU
15.0000 ug | INHALATION_SOLUTION | Freq: Two times a day (BID) | RESPIRATORY_TRACT | Status: DC
  Administered 2021-04-13: 15 ug via RESPIRATORY_TRACT
  Filled 2021-04-12 (×3): qty 2

## 2021-04-12 MED ORDER — MORPHINE SULFATE (PF) 2 MG/ML IV SOLN: 2.0000 mg | INTRAVENOUS | Status: DC | PRN

## 2021-04-12 MED ORDER — PRAMIPEXOLE DIHYDROCHLORIDE 0.25 MG PO TABS
0.5000 mg | ORAL_TABLET | Freq: Every day | ORAL | Status: DC | PRN
  Administered 2021-04-13: 0.5 mg via ORAL
  Filled 2021-04-12 (×2): qty 2

## 2021-04-12 MED ORDER — HYDROMORPHONE HCL 1 MG/ML IJ SOLN
INTRAMUSCULAR | Status: DC | PRN
  Administered 2021-04-12 (×2): .5 mg via INTRAVENOUS

## 2021-04-12 MED ORDER — ALBUTEROL SULFATE (2.5 MG/3ML) 0.083% IN NEBU
2.5000 mg | INHALATION_SOLUTION | Freq: Four times a day (QID) | RESPIRATORY_TRACT | Status: DC | PRN
  Administered 2021-04-13: 2.5 mg via RESPIRATORY_TRACT
  Filled 2021-04-12: qty 3

## 2021-04-12 MED ORDER — EPHEDRINE SULFATE 50 MG/ML IJ SOLN
INTRAMUSCULAR | Status: DC | PRN
Start: 2021-04-12 — End: 2021-04-12
  Administered 2021-04-12: 10 mg via INTRAVENOUS

## 2021-04-12 MED ORDER — GABAPENTIN 300 MG PO CAPS
ORAL_CAPSULE | ORAL | Status: AC
  Administered 2021-04-12: 300 mg via ORAL
  Filled 2021-04-12: qty 1

## 2021-04-12 MED ORDER — NICOTINE 10 MG IN INHA
1.0000 | RESPIRATORY_TRACT | Status: DC | PRN
  Administered 2021-04-12: 1 via RESPIRATORY_TRACT
  Filled 2021-04-12 (×7): qty 33

## 2021-04-12 MED ORDER — CELECOXIB 200 MG PO CAPS
ORAL_CAPSULE | ORAL | Status: AC
  Administered 2021-04-12: 200 mg via ORAL
  Filled 2021-04-12: qty 1

## 2021-04-12 MED ORDER — ONDANSETRON 4 MG PO TBDP: 4.0000 mg | ORAL_TABLET | Freq: Four times a day (QID) | ORAL | Status: DC | PRN

## 2021-04-12 MED ORDER — SEVOFLURANE IN SOLN
RESPIRATORY_TRACT | Status: AC
  Filled 2021-04-12: qty 250

## 2021-04-12 MED ORDER — LIDOCAINE HCL (PF) 2 % IJ SOLN
INTRAMUSCULAR | Status: AC
  Filled 2021-04-12: qty 5

## 2021-04-12 MED ORDER — ONDANSETRON HCL 4 MG/2ML IJ SOLN
4.0000 mg | Freq: Once | INTRAMUSCULAR | Status: AC | PRN
  Administered 2021-04-12: 4 mg via INTRAVENOUS

## 2021-04-12 MED ORDER — SUCCINYLCHOLINE CHLORIDE 200 MG/10ML IV SOSY
PREFILLED_SYRINGE | INTRAVENOUS | Status: AC
  Filled 2021-04-12: qty 10

## 2021-04-12 MED ORDER — CEFAZOLIN SODIUM-DEXTROSE 2-4 GM/100ML-% IV SOLN
INTRAVENOUS | Status: AC
  Filled 2021-04-12: qty 100

## 2021-04-12 MED ORDER — FENTANYL CITRATE (PF) 100 MCG/2ML IJ SOLN
INTRAMUSCULAR | Status: AC
  Filled 2021-04-12: qty 2

## 2021-04-12 MED ORDER — ONDANSETRON HCL 4 MG/2ML IJ SOLN: 4.0000 mg | Freq: Four times a day (QID) | INTRAMUSCULAR | Status: DC | PRN

## 2021-04-12 MED ORDER — HYDROMORPHONE HCL 1 MG/ML IJ SOLN
INTRAMUSCULAR | Status: AC
  Filled 2021-04-12: qty 1

## 2021-04-12 MED ORDER — PHENYLEPHRINE HCL (PRESSORS) 10 MG/ML IV SOLN
INTRAVENOUS | Status: DC | PRN
  Administered 2021-04-12 (×2): 160 ug via INTRAVENOUS

## 2021-04-12 MED ORDER — ACETAMINOPHEN 10 MG/ML IV SOLN: 1000.0000 mg | Freq: Once | INTRAVENOUS | Status: DC | PRN

## 2021-04-12 MED ORDER — ALBUTEROL SULFATE HFA 108 (90 BASE) MCG/ACT IN AERS: 2.0000 | INHALATION_SPRAY | RESPIRATORY_TRACT | Status: DC | PRN

## 2021-04-12 MED ORDER — LIDOCAINE HCL (CARDIAC) PF 100 MG/5ML IV SOSY
PREFILLED_SYRINGE | INTRAVENOUS | Status: DC | PRN
  Administered 2021-04-12: 100 mg via INTRAVENOUS

## 2021-04-12 MED ORDER — TIZANIDINE HCL 2 MG PO TABS
2.0000 mg | ORAL_TABLET | Freq: Two times a day (BID) | ORAL | Status: DC
  Administered 2021-04-12 – 2021-04-13 (×2): 2 mg via ORAL
  Filled 2021-04-12 (×3): qty 1

## 2021-04-12 MED ORDER — KETAMINE HCL 10 MG/ML IJ SOLN
INTRAMUSCULAR | Status: DC | PRN
  Administered 2021-04-12 (×2): 10 mg via INTRAVENOUS
  Administered 2021-04-12: 30 mg via INTRAVENOUS
  Administered 2021-04-12 (×2): 10 mg via INTRAVENOUS

## 2021-04-12 MED ORDER — DEXAMETHASONE SODIUM PHOSPHATE 10 MG/ML IJ SOLN
INTRAMUSCULAR | Status: AC
  Filled 2021-04-12: qty 1

## 2021-04-12 MED ORDER — CHLORHEXIDINE GLUCONATE 0.12 % MT SOLN
OROMUCOSAL | Status: AC
  Administered 2021-04-12: 15 mL via OROMUCOSAL
  Filled 2021-04-12: qty 15

## 2021-04-12 MED ORDER — ALBUTEROL SULFATE HFA 108 (90 BASE) MCG/ACT IN AERS
INHALATION_SPRAY | RESPIRATORY_TRACT | Status: AC
  Filled 2021-04-12: qty 6.7

## 2021-04-12 MED ORDER — PANTOPRAZOLE SODIUM 40 MG IV SOLR
40.0000 mg | Freq: Two times a day (BID) | INTRAVENOUS | Status: DC
  Administered 2021-04-12 – 2021-04-13 (×2): 40 mg via INTRAVENOUS
  Filled 2021-04-12 (×3): qty 40

## 2021-04-12 MED ORDER — CELECOXIB 200 MG PO CAPS: 200.0000 mg | ORAL_CAPSULE | ORAL | Status: AC

## 2021-04-12 MED ORDER — PROCHLORPERAZINE EDISYLATE 10 MG/2ML IJ SOLN
5.0000 mg | Freq: Four times a day (QID) | INTRAMUSCULAR | Status: DC | PRN
  Filled 2021-04-12: qty 2

## 2021-04-12 MED ORDER — OXYCODONE HCL 5 MG PO TABS
5.0000 mg | ORAL_TABLET | ORAL | Status: DC | PRN
  Administered 2021-04-12 – 2021-04-13 (×3): 10 mg via ORAL
  Filled 2021-04-12 (×3): qty 2

## 2021-04-12 MED ORDER — ENOXAPARIN SODIUM 40 MG/0.4ML IJ SOSY
40.0000 mg | PREFILLED_SYRINGE | INTRAMUSCULAR | Status: DC
  Administered 2021-04-13: 40 mg via SUBCUTANEOUS

## 2021-04-12 SURGICAL SUPPLY — 44 items
APPLIER CLIP 9.375 SM OPEN (CLIP) ×2
BLADE SURG 15 STRL LF DISP TIS (BLADE) ×1 IMPLANT
BLADE SURG 15 STRL SS (BLADE) ×2
CHLORAPREP W/TINT 26 (MISCELLANEOUS) ×2 IMPLANT
CLIP APPLIE 9.375 SM OPEN (CLIP) ×1 IMPLANT
DERMABOND ADVANCED (GAUZE/BANDAGES/DRESSINGS) ×1
DERMABOND ADVANCED .7 DNX12 (GAUZE/BANDAGES/DRESSINGS) ×1 IMPLANT
DRAPE LAPAROTOMY 77X122 PED (DRAPES) ×2 IMPLANT
DRAPE SURG 17X11 SM STRL (DRAPES) ×8 IMPLANT
ELECT CAUTERY BLADE 6.4 (BLADE) ×2 IMPLANT
ELECT LARYNGEAL DUAL CHAN (ELECTRODE) ×2 IMPLANT
ELECT NEEDLE 20X.3 GREEN (MISCELLANEOUS) ×2
ELECT REM PT RETURN 9FT ADLT (ELECTROSURGICAL) ×2
ELECTRODE NEEDLE 20X.3 GREEN (MISCELLANEOUS) ×1 IMPLANT
ELECTRODE REM PT RTRN 9FT ADLT (ELECTROSURGICAL) ×1 IMPLANT
GAUZE 4X4 16PLY ~~LOC~~+RFID DBL (SPONGE) ×2 IMPLANT
GLOVE SURG ENC MOIS LTX SZ7 (GLOVE) ×2 IMPLANT
GOWN STRL REUS W/ TWL LRG LVL3 (GOWN DISPOSABLE) ×3 IMPLANT
GOWN STRL REUS W/TWL LRG LVL3 (GOWN DISPOSABLE) ×6
KIT TURNOVER KIT A (KITS) ×2 IMPLANT
LABEL OR SOLS (LABEL) ×2 IMPLANT
MANIFOLD NEPTUNE II (INSTRUMENTS) ×2 IMPLANT
NS IRRIG 500ML POUR BTL (IV SOLUTION) IMPLANT
PACK BASIN MINOR ARMC (MISCELLANEOUS) ×2 IMPLANT
PENCIL ELECTRO HAND CTR (MISCELLANEOUS) ×2 IMPLANT
PROBE NEUROSIGN BIPOL (MISCELLANEOUS) ×1 IMPLANT
PROBE NEUROSIGN BIPOLAR (MISCELLANEOUS) ×2
SHEARS HARMONIC 9CM CVD (BLADE) ×2 IMPLANT
SPONGE KITTNER 5P (MISCELLANEOUS) ×4 IMPLANT
SPONGE T-LAP 18X18 ~~LOC~~+RFID (SPONGE) ×2 IMPLANT
SUT MNCRL 4-0 (SUTURE) ×2
SUT MNCRL 4-0 27XMFL (SUTURE) ×1
SUT SILK 0 (SUTURE) ×2
SUT SILK 0 30XBRD TIE 6 (SUTURE) ×1 IMPLANT
SUT SILK 2 0 (SUTURE) ×1
SUT SILK 2-0 18XBRD TIE 12 (SUTURE) ×1 IMPLANT
SUT SILK 3 0 (SUTURE) ×2
SUT SILK 3-0 18XBRD TIE 12 (SUTURE) ×1 IMPLANT
SUT VIC AB 2-0 SH 27 (SUTURE) ×2
SUT VIC AB 2-0 SH 27XBRD (SUTURE) ×1 IMPLANT
SUT VICRYL 3-0 CR8 SH (SUTURE) ×2 IMPLANT
SUTURE MNCRL 4-0 27XMF (SUTURE) ×1 IMPLANT
WATER STERILE IRR 1000ML POUR (IV SOLUTION) ×2 IMPLANT
WATER STERILE IRR 500ML POUR (IV SOLUTION) IMPLANT

## 2021-04-12 NOTE — Anesthesia Preprocedure Evaluation (Signed)
Anesthesia Evaluation  Patient identified by MRN, date of birth, ID band Patient awake    Reviewed: Allergy & Precautions, NPO status , Patient's Chart, lab work & pertinent test results  History of Anesthesia Complications Negative for: history of anesthetic complications  Airway Mallampati: III   Neck ROM: Full    Dental  (+) Edentulous Upper, Edentulous Lower   Pulmonary asthma , COPD, Current Smoker (1/2 ppd) and Patient abstained from smoking.,    Pulmonary exam normal breath sounds clear to auscultation       Cardiovascular + CAD  Normal cardiovascular exam Rhythm:Regular Rate:Normal  ECG 04/10/21: normal   Neuro/Psych  Headaches, PSYCHIATRIC DISORDERS Anxiety Chronic pain  Neuromuscular disease (peripheral neuropathy)    GI/Hepatic GERD  ,Fatty liver   Endo/Other  Obesity   Renal/GU negative Renal ROS     Musculoskeletal   Abdominal   Peds  Hematology negative hematology ROS (+)   Anesthesia Other Findings   Reproductive/Obstetrics                             Anesthesia Physical Anesthesia Plan  ASA: 3  Anesthesia Plan: General   Post-op Pain Management:    Induction: Intravenous  PONV Risk Score and Plan: 2 and Ondansetron, Dexamethasone and Treatment may vary due to age or medical condition  Airway Management Planned: Oral ETT  Additional Equipment:   Intra-op Plan:   Post-operative Plan: Extubation in OR  Informed Consent: I have reviewed the patients History and Physical, chart, labs and discussed the procedure including the risks, benefits and alternatives for the proposed anesthesia with the patient or authorized representative who has indicated his/her understanding and acceptance.     Dental advisory given  Plan Discussed with: CRNA  Anesthesia Plan Comments: (Patient consented for risks of anesthesia including but not limited to:  - adverse reactions  to medications - damage to eyes, teeth, lips or other oral mucosa - nerve damage due to positioning  - sore throat or hoarseness - damage to heart, brain, nerves, lungs, other parts of body or loss of life  Informed patient about role of CRNA in peri- and intra-operative care.  Patient voiced understanding.)        Anesthesia Quick Evaluation

## 2021-04-12 NOTE — Anesthesia Procedure Notes (Signed)
Procedure Name: Intubation Date/Time: 04/12/2021 12:43 PM Performed by: Lia Foyer, CRNA Pre-anesthesia Checklist: Patient identified, Emergency Drugs available, Suction available and Patient being monitored Patient Re-evaluated:Patient Re-evaluated prior to induction Oxygen Delivery Method: Circle system utilized Preoxygenation: Pre-oxygenation with 100% oxygen Induction Type: IV induction Ventilation: Mask ventilation without difficulty Laryngoscope Size: McGraph and 3 Tube type: Oral Number of attempts: 1 Airway Equipment and Method: Stylet and Video-laryngoscopy Placement Confirmation: ETT inserted through vocal cords under direct vision, positive ETCO2 and breath sounds checked- equal and bilateral Secured at: 19 cm Tube secured with: Tape Dental Injury: Teeth and Oropharynx as per pre-operative assessment

## 2021-04-12 NOTE — Op Note (Signed)
  04/12/2021  4:06 PM  PATIENT:  Janet Murray  50 y.o. female  PRE-OPERATIVE DIAGNOSIS:  Suspicious left thyroid nodule  POST-OPERATIVE DIAGNOSIS:  Same  PROCEDURE:   Total Thyroidectomy  SURGEON:  Surgeon(s) and Role:    * Jalaine Riggenbach F, MD - Primary    * Ronny Bacon, MD  ASSISTANTS: Dr. Christian Mate ( essential due to the complexity of the case, given expertise and experience with helping with judgement, exposure and tying vessels)  ANESTHESIA: GETA  FINDINGS: Thyroid with chronic inflammatory changes, no evidence of gross nodal disease Right inferior and Left superior parathyroid glands identified unequivocally Identification of RLN bilateral by inspection and appropriate identification and Nerve monitoring  DICTATION:  Patient was explained about the procedure in detail. Risks, benefits and possible complications ( including but not limited to bleeding, infection, re interventions, RLN injury, hoarseness and vascular injuries) and a consent was obtained. The patient taken to the operating room and placed in the supine position with the neck extended and all pressure points appropriately padded .  Serous incision was created in the neck 2 fingerbreadths above the suprasternal notch.  Electrocautery was used to dissect through subcutaneous tissue and subplatysmal flaps were raised inferiorly and superiorly in the standard fashion.  The raphae was identified and incised.  Strap muscles were retracted laterally.  We started on the left thyroid gland and initially active cautery was used to identify the plane.  There were chronic changes of inflammatory response.  The superior pole was mobilized .We were able to carefully take the superior and inferior pedicle and the Berry ligament, we were able to  appropriately identified the recurrent laryngeal nerve with both visual inspection and nerve monitoring.  Only after we were able to identify the nerve we were able to start tying the  pedicles to the thyroid gland.  We also used a harmonic on the thyroid side of the pedicle.  The thyroid on the left side was brought lateral to medial in the standard fashion.  Patient then was turned to the right thyroid lobe where an identical dissection was performed. The superior pole was mobilized .We were able to carefully take the superior pedicle and the Berry ligament, we were able to  appropriately identified the recurrent laryngeal nerve with both visual inspection and nerve monitoring.  Only after we were able to identify the nerve we were able to start tying the pedicles to the thyroid gland.  We also used a harmonic on the thyroid side of the pedicle.  The tyroid was taken from the trachea in the standard fashion and the specimen was sent to permanent pathology. Wound irrigated with water and valsalva performed, no evidence of active bleeding was observed. Straps muscle were approximated with two 2-0 vicryls and the platysma was closed w multiple 2-0 vicryls. 4-0 monocryl used to close the skin. Dermabond was applied  Needle and laparotomy counts were correct and there were no immediate complications  Jules Husbands, MD

## 2021-04-12 NOTE — Transfer of Care (Signed)
Immediate Anesthesia Transfer of Care Note  Patient: Janet Murray  Procedure(s) Performed: THYROIDECTOMY, total  Patient Location: PACU  Anesthesia Type:General  Level of Consciousness: drowsy  Airway & Oxygen Therapy: Patient Spontanous Breathing and Patient connected to face mask oxygen  Post-op Assessment: Report given to RN and Post -op Vital signs reviewed and stable  Post vital signs: Reviewed and stable  Last Vitals:  Vitals Value Taken Time  BP 149/79 04/12/21 1609  Temp 36.2 C 04/12/21 1609  Pulse 80 04/12/21 1613  Resp 13 04/12/21 1613  SpO2 96 % 04/12/21 1613  Vitals shown include unvalidated device data.  Last Pain:  Vitals:   04/12/21 1609  TempSrc:   PainSc: Asleep      Patients Stated Pain Goal: 0 (83/66/29 [id number])  Complications: No notable events documented.

## 2021-04-12 NOTE — Anesthesia Postprocedure Evaluation (Signed)
Anesthesia Post Note  Patient: CLINTON WAHLBERG  Procedure(s) Performed: THYROIDECTOMY, total  Patient location during evaluation: PACU Anesthesia Type: General Level of consciousness: awake and alert Pain management: pain level controlled Vital Signs Assessment: post-procedure vital signs reviewed and stable Respiratory status: spontaneous breathing, nonlabored ventilation, respiratory function stable and patient connected to nasal cannula oxygen Cardiovascular status: blood pressure returned to baseline and stable Postop Assessment: no apparent nausea or vomiting Anesthetic complications: no   No notable events documented.   Last Vitals:  Vitals:   04/12/21 1737 04/12/21 1838  BP: (!) 149/83 140/79  Pulse: 70 65  Resp: 19 19  Temp: (!) 36.4 C 36.9 C  SpO2: 93% 97%    Last Pain:  Vitals:   04/12/21 1838  TempSrc: Oral  PainSc:                  Martha Clan

## 2021-04-12 NOTE — Interval H&P Note (Signed)
History and Physical Interval Note:  04/12/2021 10:44 AM  Janet Murray  has presented today for surgery, with the diagnosis of multiple thyroid nodules.  The various methods of treatment have been discussed with the patient and family. After consideration of risks, benefits and other options for treatment, the patient has consented to  Procedure(s): THYROIDECTOMY, total (N/A) as a surgical intervention.  The patient's history has been reviewed, patient examined, no change in status, stable for surgery.  I have reviewed the patient's chart and labs.  Questions were answered to the patient's satisfaction.     [place]

## 2021-04-13 ENCOUNTER — Encounter: Payer: Self-pay | Admitting: Surgery

## 2021-04-13 DIAGNOSIS — E041 Nontoxic single thyroid nodule: Secondary | ICD-10-CM | POA: Diagnosis not present

## 2021-04-13 LAB — CBC
HCT: 37 % (ref 36.0–46.0)
Hemoglobin: 12.4 g/dL (ref 12.0–15.0)
MCH: 33.5 pg (ref 26.0–34.0)
MCHC: 33.5 g/dL (ref 30.0–36.0)
MCV: 100 fL (ref 80.0–100.0)
Platelets: 148 10*3/uL — ABNORMAL LOW (ref 150–400)
RBC: 3.7 MIL/uL — ABNORMAL LOW (ref 3.87–5.11)
RDW: 12.8 % (ref 11.5–15.5)
WBC: 14.2 10*3/uL — ABNORMAL HIGH (ref 4.0–10.5)
nRBC: 0 % (ref 0.0–0.2)

## 2021-04-13 LAB — BASIC METABOLIC PANEL
Anion gap: 6 (ref 5–15)
BUN: 9 mg/dL (ref 6–20)
CO2: 26 mmol/L (ref 22–32)
Calcium: 8.3 mg/dL — ABNORMAL LOW (ref 8.9–10.3)
Chloride: 104 mmol/L (ref 98–111)
Creatinine, Ser: 0.57 mg/dL (ref 0.44–1.00)
GFR, Estimated: >60 mL/min (ref >60)
Glucose, Bld: 139 mg/dL — ABNORMAL HIGH (ref 70–99)
Potassium: 3.9 mmol/L (ref 3.5–5.1)
Sodium: 136 mmol/L (ref 135–145)

## 2021-04-13 LAB — HIV ANTIBODY (ROUTINE TESTING W REFLEX): HIV Screen 4th Generation wRfx: NONREACTIVE

## 2021-04-13 MED ORDER — OXYCODONE HCL 5 MG PO TABS: 5.0000 mg | ORAL_TABLET | ORAL | 0 refills | Status: DC | PRN

## 2021-04-13 MED ORDER — CALCIUM CARBONATE 1250 (500 CA) MG PO TABS: 1.0000 | ORAL_TABLET | Freq: Three times a day (TID) | ORAL | 0 refills | Status: DC

## 2021-04-13 MED ORDER — SODIUM CHLORIDE 0.9 % IV BOLUS
1000.0000 mL | Freq: Once | INTRAVENOUS | Status: AC
Start: 2021-04-13 — End: 2021-04-13
  Administered 2021-04-13: 1000 mL via INTRAVENOUS

## 2021-04-13 MED ORDER — CALCIUM CARBONATE 1250 (500 CA) MG PO TABS
1.0000 | ORAL_TABLET | Freq: Two times a day (BID) | ORAL | Status: DC
  Filled 2021-04-13 (×2): qty 1

## 2021-04-13 NOTE — Progress Notes (Signed)
Patient educated on discharge instructions, medications, and follow up appointments. Patient verbalized understanding. Patient will be driven home by friend.

## 2021-04-13 NOTE — Discharge Summary (Signed)
Patient ID: BRIONY PARVEEN MRN: [id number] DOB/AGE: Apr 24, 1971 50 y.o.  Admit date: 04/12/2021 Discharge date: 04/13/2021   Discharge Diagnoses:  Active Problems:   S/P thyroidectomy   Procedures: Total thyroidectomy  Hospital Course:  50 year old female with a suspicious left upper thyroid nodule.  She significant concerns about nodules on the right side and potential for malignancy.  Chose to have a total thyroidectomy and underwent this procedure without any significant events. She was admitted  overnight. At The time of discharge the patient was ambulating,  pain was controlled.  Her vital signs were stable and she was afebrile.   physical exam at discharge showed a pt  in no acute distress.  Awake and alert.  Neck: Soft incision healing well without infection or expanding hematomas.  Extremities well-perfused and no edema.  Condition of the patient the time of discharge was stable. She will be supplemented w Calcium carbonate BID and we will follow her in the office closely.     Disposition: Discharge disposition: 01-Home or Self Care       Discharge Instructions     Call MD for:  difficulty breathing, headache or visual disturbances   Complete by: As directed    Call MD for:  extreme fatigue   Complete by: As directed    Call MD for:  hives   Complete by: As directed    Call MD for:  persistant dizziness or light-headedness   Complete by: As directed    Call MD for:  persistant nausea and vomiting   Complete by: As directed    Call MD for:  redness, tenderness, or signs of infection (pain, swelling, redness, odor or green/yellow discharge around incision site)   Complete by: As directed    Call MD for:  severe uncontrolled pain   Complete by: As directed    Call MD for:  temperature >100.4   Complete by: As directed    Diet - low sodium heart healthy   Complete by: As directed    Discharge instructions   Complete by: As directed    Shower tomorrow   Increase  activity slowly   Complete by: As directed    Lifting restrictions   Complete by: As directed    30 lbs x 4 wks      Allergies as of 04/13/2021       Reactions   Penicillins Hives, Swelling   Has patient had a PCN reaction causing immediate rash, facial/tongue/throat swelling, SOB or lightheadedness with hypotension: Yes Has patient had a PCN reaction causing severe rash involving mucus membranes or skin necrosis: No Has patient had a PCN reaction that required hospitalization No Has patient had a PCN reaction occurring within the last 10 years: No If all of the above answers are "NO", then may proceed with Cephalosporin use.   Acetaminophen    Due to polyps    Ciprofloxacin Itching   Aspirin Palpitations   Heart palpitations   Hydrocodone Hives, Rash   Other Rash   bandaids   Tape Rash   bandaids bandaids        Medication List     TAKE these medications    AMBULATORY NON FORMULARY MEDICATION Medication Name: nebulizer DX: J44.9   atorvastatin 80 MG tablet Commonly known as: LIPITOR Take 1 tablet (80 mg total) by mouth daily.   Bevespi Aerosphere 9-4.8 MCG/ACT Aero Generic drug: Glycopyrrolate-Formoterol INHALE 2 PUFFS BY MOUTH INTO THE LUNGS 2TIMES DAILY   calcium carbonate 1250 (500 Ca)  MG tablet Commonly known as: OS-CAL - dosed in mg of elemental calcium Take 1 tablet (500 mg of elemental calcium total) by mouth 3 (three) times daily with meals.   diclofenac Sodium 1 % Gel Commonly known as: VOLTAREN Apply 1 application topically daily as needed (Knee pain).   esomeprazole 40 MG capsule Commonly known as: NEXIUM TAKE ONE CAPSULE BY MOUTH TWICE A DAY   hydrOXYzine 25 MG tablet Commonly known as: ATARAX/VISTARIL Take 1 tablet (25 mg total) by mouth 3 (three) times daily as needed. What changed: reasons to take this   Insulin Pen Needle 32G X 4 MM Misc 1 Units by Does not apply route every morning. Pen needles   levothyroxine 75 MCG  tablet Commonly known as: SYNTHROID Take 75 mcg by mouth daily before breakfast.   nicotine 10 MG inhaler Commonly known as: NICOTROL Inhale 1 Cartridge (1 continuous puffing total) into the lungs as needed for smoking cessation. Inhale every 1-2 hours as needed.   ondansetron 4 MG disintegrating tablet Commonly known as: ZOFRAN-ODT TAKE 1 TABLET BY MOUTH EVERY 8 HOURS AS NEEDED FOR UP TO 5 DAYS   oxyCODONE 5 MG immediate release tablet Commonly known as: Oxy IR/ROXICODONE Take 5 mg by mouth every 4 (four) hours as needed. What changed: Another medication with the same name was added. Make sure you understand how and when to take each.   oxyCODONE 5 MG immediate release tablet Commonly known as: Oxy IR/ROXICODONE Take 1 tablet (5 mg total) by mouth every 4 (four) hours as needed for severe pain. What changed: You were already taking a medication with the same name, and this prescription was added. Make sure you understand how and when to take each.   pramipexole 0.5 MG tablet Commonly known as: MIRAPEX Take 1 tablet (0.5 mg total) by mouth 3 (three) times daily. What changed:  when to take this reasons to take this   tiZANidine 2 MG tablet Commonly known as: ZANAFLEX Take 2 mg by mouth 2 (two) times daily.   Ventolin HFA 108 (90 Base) MCG/ACT inhaler Generic drug: albuterol INHALE 2 PUFFS BY MOUTH INTO THE LUNGS EVERY 6 HOURS AS NEEDED FOR WHEEZING OR SHORTNESS OF BREATH What changed:  when to take this reasons to take this   albuterol (2.5 MG/3ML) 0.083% nebulizer solution Commonly known as: PROVENTIL Take 3 mLs (2.5 mg total) by nebulization every 6 (six) hours as needed for wheezing or shortness of breath. What changed: Another medication with the same name was changed. Make sure you understand how and when to take each.        Follow-up Information     Jon Billings, NP .   Specialty: Nurse Practitioner Contact information: [place]  [address] [phone]         Caroleen Hamman F, MD. Schedule an appointment as soon as possible for a visit in 2 week(s).   Specialty: General Surgery Why: Call Monday to make a follow up appt for 2 weeks. Contact information: [address] [place] [address] [phone]                  Caroleen Hamman, MD FACS

## 2021-04-13 NOTE — Discharge Instructions (Signed)
Thyroidectomy, Care After This sheet gives you information about how to care for yourself after your procedure. Your health care provider may also give you more specific instructions. If you have problems or questions, contact your health care provider. What can I expect after the procedure? After the procedure, it is common to have: Mild pain in the neck or upper body, especially when swallowing. A swollen neck. A sore throat. A weak or hoarse voice. Slight tingling or numbness around your mouth, or in your fingers or toes. This may last for a day or two after surgery. This condition is caused by low levels of calcium. You may be given calcium supplements to treat it. Follow these instructions at home: Medicines Take over-the-counter and prescription medicines only as told by your health care provider. Do not drive or use heavy machinery while taking prescription pain medicine. Do not take medicines that contain aspirin and ibuprofen until your health care provider says that you can. These medicines can increase your risk of bleeding. Take a thyroid hormone medicine as recommended by your health care provider. You will have to take this medicine for the rest of your life if your entire thyroid was removed. Eating and drinking Start slowly with eating. You may need to have only liquids and soft foods for a few days or as directed by your health care provider. To prevent or treat constipation while you are taking prescription pain medicine, your health care provider may recommend that you: Drink enough fluid to keep your urine pale yellow. Take over-the-counter or prescription medicines. Eat foods that are high in fiber, such as fresh fruits and vegetables, whole grains, and beans. Limit foods that are high in fat and processed sugars, such as fried and sweet foods. Incision care Follow instructions from your health care provider about how to take care of your incision. Make sure you: Wash  your hands with soap and water before you change your bandage (dressing). If soap and water are not available, use hand sanitizer. Change your dressing as told by your health care provider. Leave stitches (sutures), skin glue, or adhesive strips in place. These skin closures may need to stay in place for 2 weeks or longer. If adhesive strip edges start to loosen and curl up, you may trim the loose edges. Do not remove adhesive strips completely unless your health care provider tells you to do that. Check your incision area every day for signs of infection. Check for: Redness, swelling, or pain. Fluid or blood. Warmth. Pus or a bad smell. Do not take baths, swim, or use a hot tub until your health care provider approves. Activity For the first 10 days after the procedure or as instructed by your health care provider: Do not lift anything that is heavier than 10 lb (4.5 kg). Do not jog, swim, or do other strenuous exercises. Do not play contact sports. Avoid sitting for a long time without moving. Get up to take short walks every 1-2 hours. This is needed to improve blood flow and breathing. Ask for help if you feel weak or unsteady. Return to your normal activities as told by your health care provider. Ask your health care provider what activities are safe for you. General instructions Do not use any products that contain nicotine or tobacco, such as cigarettes and e-cigarettes. These can delay healing after surgery. If you need help quitting, ask your health care provider. Keep all follow-up visits as told by your health care provider. This is important.  Your health care provider needs to monitor the calcium level in your blood to make sure that it does not become low. Contact a health care provider if you: Have a fever. Have more redness, swelling, or pain around your incision area. Have fluid or blood coming from your incision area. Notice that your incision area feels warm to the  touch. Have pus or a bad smell coming from your incision area. Have trouble talking. Have nausea or vomiting for more than 2 days. Get help right away if you: Have trouble breathing. Have trouble swallowing. Develop a rash. Develop a cough that gets worse. Notice that your speech changes, or you have hoarseness that gets worse. Develop numbness, tingling, or muscle spasms in the arms, hands, feet, or face. Summary After the procedure, it is common to feel mild pain in the neck or upper body, especially when swallowing. Take medicines as told by your health care provider. These include pain medicines and thyroid hormones, if required. Follow instructions from your health care provider about how to take care of your incision. Watch for signs of infection. Keep all follow-up visits as told by your health care provider. This is important. Your health care provider needs to monitor the calcium level in your blood to make sure that it does not become low. Get help right away if you develop difficulty breathing, or numbness, tingling, or muscle spasms in the arms, hands, feet, or face. This information is not intended to replace advice given to you by your health care provider. Make sure you discuss any questions you have with your health care provider. Document Revised: 02/02/2020 Document Reviewed: 02/02/2020

## 2021-04-15 ENCOUNTER — Telehealth: Payer: Self-pay | Admitting: *Deleted

## 2021-04-15 NOTE — Telephone Encounter (Signed)
Transition Care Management Unsuccessful Follow-up Telephone Call  Date of discharge and from where:  [facility] 04-13-2021  Attempts:  1st Attempt  Reason for unsuccessful TCM follow-up call:  Left voice message

## 2021-04-19 LAB — SURGICAL PATHOLOGY

## 2021-04-22 ENCOUNTER — Encounter: Payer: Self-pay | Admitting: Nurse Practitioner

## 2021-04-22 ENCOUNTER — Other Ambulatory Visit: Payer: Self-pay

## 2021-04-22 ENCOUNTER — Ambulatory Visit (INDEPENDENT_AMBULATORY_CARE_PROVIDER_SITE_OTHER): Payer: Medicare Other | Admitting: Nurse Practitioner

## 2021-04-22 VITALS — BP 118/75 | HR 69 | Temp 98.8°F | Wt 193.0 lb

## 2021-04-22 DIAGNOSIS — R208 Other disturbances of skin sensation: Secondary | ICD-10-CM | POA: Diagnosis not present

## 2021-04-22 DIAGNOSIS — I2510 Atherosclerotic heart disease of native coronary artery without angina pectoris: Secondary | ICD-10-CM

## 2021-04-22 DIAGNOSIS — B029 Zoster without complications: Secondary | ICD-10-CM

## 2021-04-22 MED ORDER — GABAPENTIN 300 MG PO CAPS: 300.0000 mg | ORAL_CAPSULE | Freq: Three times a day (TID) | ORAL | 0 refills | Status: DC

## 2021-04-22 MED ORDER — ACYCLOVIR 800 MG PO TABS: 800.0000 mg | ORAL_TABLET | Freq: Every day | ORAL | 0 refills | Status: DC

## 2021-04-22 NOTE — Progress Notes (Signed)
BP 118/75   Pulse 69   Temp 98.8 F (37.1 C) (Oral)   Wt 193 lb (87.5 kg)   SpO2 96%   BMI 36.47 kg/m    Subjective:    Patient ID: Janet Murray, female    DOB: [birth year], 50 y.o.   MRN: [id number]  HPI: Janet Murray is a 50 y.o. female  Chief Complaint  Patient presents with   Leg Pain    Pt states she has been having R leg pain for the last 5 days. States the pain runs into the top of her leg and feels like a burning sensation.    Insect Bite    Pt states she has a bite or something on her stomach that she does not know where it came from. States she just noticed it and does not know what it is.    LEG PAIN Patient states she started having leg pain on Thursday.  It feels like a burning sensation on top of her leg.  Hurts worse when she coughs.  She has tried heat, ice, oxycodone, voltaren gel, tizanadine and mirapex.  None of the interventions she has tried have changed the sensation.  Does have a small bug bite on the right side of her belly button.  Relevant past medical, surgical, family and social history reviewed and updated as indicated. Interim medical history since our last visit reviewed. Allergies and medications reviewed and updated.  Review of Systems  Musculoskeletal:        Right thigh burning  Skin:        Bug bite   Per HPI unless specifically indicated above     Objective:    BP 118/75   Pulse 69   Temp 98.8 F (37.1 C) (Oral)   Wt 193 lb (87.5 kg)   SpO2 96%   BMI 36.47 kg/m   Wt Readings from Last 3 Encounters:  04/22/21 193 lb (87.5 kg)  04/12/21 192 lb 3.9 oz (87.2 kg)  04/11/21 192 lb 3.2 oz (87.2 kg)    Physical Exam Vitals and nursing note reviewed.  Constitutional:      General: She is not in acute distress.    Appearance: Normal appearance. She is normal weight. She is not ill-appearing, toxic-appearing or diaphoretic.  HENT:     Head: Normocephalic.     Right Ear: External ear normal.     Left Ear: External ear normal.      Nose: Nose normal.     Mouth/Throat:     Mouth: Mucous membranes are moist.     Pharynx: Oropharynx is clear.  Eyes:     General:        Right eye: No discharge.        Left eye: No discharge.     Extraocular Movements: Extraocular movements intact.     Conjunctiva/sclera: Conjunctivae normal.     Pupils: Pupils are equal, round, and reactive to light.  Cardiovascular:     Rate and Rhythm: Normal rate and regular rhythm.     Heart sounds: No murmur heard. Pulmonary:     Effort: Pulmonary effort is normal. No respiratory distress.     Breath sounds: Normal breath sounds. No wheezing or rales.  Abdominal:    Musculoskeletal:     Cervical back: Normal range of motion and neck supple.  Skin:    General: Skin is warm and dry.     Capillary Refill: Capillary refill takes less than 2 seconds.  Neurological:  General: No focal deficit present.     Mental Status: She is alert and oriented to person, place, and time. Mental status is at baseline.  Psychiatric:        Mood and Affect: Mood normal.        Behavior: Behavior normal.        Thought Content: Thought content normal.        Judgment: Judgment normal.    Results for orders placed or performed during the hospital encounter of 04/12/21  HIV Antibody (routine testing w rflx)  Result Value Ref Range   HIV Screen 4th Generation wRfx Non Reactive Non Reactive  CBC  Result Value Ref Range   WBC 13.9 (H) 4.0 - 10.5 K/uL   RBC 4.01 3.87 - 5.11 MIL/uL   Hemoglobin 13.3 12.0 - 15.0 g/dL   HCT 39.7 36.0 - 46.0 %   MCV 99.0 80.0 - 100.0 fL   MCH 33.2 26.0 - 34.0 pg   MCHC 33.5 30.0 - 36.0 g/dL   RDW 12.8 11.5 - 15.5 %   Platelets 161 150 - 400 K/uL   nRBC 0.0 0.0 - 0.2 %  Basic metabolic panel  Result Value Ref Range   Sodium 137 135 - 145 mmol/L   Potassium 3.8 3.5 - 5.1 mmol/L   Chloride 103 98 - 111 mmol/L   CO2 26 22 - 32 mmol/L   Glucose, Bld 140 (H) 70 - 99 mg/dL   BUN 8 6 - 20 mg/dL   Creatinine, Ser 0.48 0.44  - 1.00 mg/dL   Calcium 8.6 (L) 8.9 - 10.3 mg/dL   GFR, Estimated >60 >60 mL/min   Anion gap 8 5 - 15  Basic metabolic panel  Result Value Ref Range   Sodium 136 135 - 145 mmol/L   Potassium 3.9 3.5 - 5.1 mmol/L   Chloride 104 98 - 111 mmol/L   CO2 26 22 - 32 mmol/L   Glucose, Bld 139 (H) 70 - 99 mg/dL   BUN 9 6 - 20 mg/dL   Creatinine, Ser 0.57 0.44 - 1.00 mg/dL   Calcium 8.3 (L) 8.9 - 10.3 mg/dL   GFR, Estimated >60 >60 mL/min   Anion gap 6 5 - 15  CBC  Result Value Ref Range   WBC 14.2 (H) 4.0 - 10.5 K/uL   RBC 3.70 (L) 3.87 - 5.11 MIL/uL   Hemoglobin 12.4 12.0 - 15.0 g/dL   HCT 37.0 36.0 - 46.0 %   MCV 100.0 80.0 - 100.0 fL   MCH 33.5 26.0 - 34.0 pg   MCHC 33.5 30.0 - 36.0 g/dL   RDW 12.8 11.5 - 15.5 %   Platelets 148 (L) 150 - 400 K/uL   nRBC 0.0 0.0 - 0.2 %  ABO/Rh  Result Value Ref Range   ABO/RH(D)      O NEG Performed at [facility], [address] [place], [place] [address]   Surgical pathology  Result Value Ref Range   SURGICAL PATHOLOGY      SURGICAL PATHOLOGY CASE: [id number] PATIENT: [facility] Surgical Pathology Report     Specimen Submitted: A. Thyroid, right B. Thyroid, left  Clinical History: FNA of left inferior thyroid nodule was benign by Thyroseq.  Left mid nodule was Bethesda 3, Thyroseq positive for HRAS mutation.      DIAGNOSIS: A. 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 - SEE CANCER SUMMARY BELOW.  B. 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 - NEGATIVE FOR MALIGNANCY.  CANCER CASE SUMMARY: THYROID GLAND Standard(s): AJCC-UICC 8  SPECIMEN Procedure: Thyroidectomy  TUMOR Tumor Focality: Unifocal Tumor Site: Right, SEE COMMENT. Tumor Size: 1.6 x 1.2 x 0.8 cm Histologic Type: Follicular carcinoma, minimally invasive Angioinvasion (Vascular Invasion): Not identified Lymphatic Invasion: Not  identified Extrathyroidal Extension: Not identified Margin Status: All margin s negative for carcinoma  REGIONAL LYMPH NODES Regional Lymph Nodes Present (one incidentally found, level VI) All regional lymph nodes negative for tumor Number of Lymph Nodes Examined: 1   DISTANT METASTASIS Distant Site(s) Involved, if applicable (select all that apply): Not applicable  PATHOLOGIC STAGE CLASSIFICATION (pTNM, AJCC 8th Edition): TNM Descriptors: Not applicable VP7T pN0 pM - Not applicable  Comment: Based on review of the electronic medical record, two LEFT thyroid nodules have been sampled by FNA with ThyroSeq molecular studies (one benign result and one suspicious).  The RIGHT thyroid nodule has not been previously sampled.  The LEFT nodule (in a background of nodular hyperplasia) demonstrates no evidence of capsular invasion.  The RIGHT nodule demonstrates capsular invasion and is therefore classified as follicular carcinoma.  GROSS DESCRIPTION: A. Labeled: Thyroid right Received: Fresh Collection time: 3:12 PM on 04/12/2021 Placed into  formalin time: 4:10 PM on 04/12/2021 Type of procedure: Thyroidectomy Weight of specimen: 2.36 grams Size: of specimen: 3.5 (superior to inferior) x 1.6 (medial to lateral) x 1.1 (anterior to posterior) cm Orientation: The specimen is received unoriented; orientation is presumed based on the shape of the specimen.  The specimen is inked as follows: Anterior = blue, posterior = black, presumed isthmic resection margin = orange. Number of masses: 1 Location of mass(es): Mid to inferior pole  Size(s) of mass(es): 1.6 x 1.2 x 0.8 cm Description of mass(es): The mass has a prominent peripheral rim of white discoloration with scattered areas of tan and yellow-orange discoloration. Confinement to thyroid: The mass is grossly confined to the thyroid. Margins: The mass is less than 0.1 cm to the anterior capsule and posterior capsule and is 0.2 cm  to the isthmic resection margin. Description of remainder of the thyroid: The remainder of the specimen is comprised of tan-Ofarrell  soft tissue. Parathyroids: None grossly appreciated. Lymph nodes: None grossly appreciated.  Block summary (specimen submitted entirely): 1 - 3 - mass, submitted entirely and sequentially from superior to inferior      2 - closest approach to anterior capsule, posterior capsule, and isthmic resection margin      3 - inferior most aspect, perpendicularly sectioned 4 - 5 - remaining grossly normal thyroid, submitted from superior to inferior  B. Labeled: Thyroid left Received: Fresh Collection time: 3:12 PM on 04/12/2021 Placed into formalin time: 4:10 PM 04/12/2021 Type of procedure: Thyroidectomy Weight of specimen: 6.11 grams Size: of specimen: Left lobe: 2.2 (superior to inferior) x 1.7 (medial to lateral) x 1.5 (anterior posterior); potential isthmus: 1.9 (superior to inferior) x 1.2 (medial to lateral) x 1.7 (anterior to posterior); accessory lobe: 2.1 x 1.8 x 1.1 cm Orientation: The specimen is received unoriented; orientation is presumed based on the shape  of the specimen.  The specimen is inked as follows: Anterior = blue, posterior = black, potential isthmus over inked orange, accessory lobe = green, and isthmic resection margin = red Number of masses: 4 Location of mass(es): Mass A: Left superior to inferior pole; mass B: inferior accessory lobe; mass C: Superior potential isthmus; mass B: Inferior potential isthmus Size(s) of mass(es): Mass A: 3.2  x 1.7 x 1.2 cm; mass B: 2.1 x 1.8 x 1.1 cm; mass C: 0.9 x 0.6 x 0.5 cm; mass D: 1.4 x 1 x 0.7 cm Description of mass(es): Mass A is well-circumscribed, tan, focally hemorrhagic, and focally cystic.  Mass B is tan and homogenous with scattered areas of hemorrhage.  Masses C and D are well-circumscribed, tan, and homogenous. Confinement to thyroid: Masses A, C, and D are grossly confined to  the thyroid.  Mass B is grossly confined to the pedunculated accessory lobe. Margins: Focally less than 0.1 cm to the anterior capsule, posterior capsule, and isthmic resection margin Desc ription of remainder of the thyroid: The scant amount of remaining parenchyma is tan-Maynor and grossly unremarkable. Parathyroids: None grossly appreciated. Lymph nodes: None grossly appreciated.  Block summary (specimen submitted entirely): 1 - 9 - left lobe and potential isthmus, submitted entirely and sequentially from superior to inferior      1 - 9 - mass A           1 - superior aspect of mass A, perpendicularly sectioned           2 - 4 - mass C           5 - 7 - mass D                7 - inferior aspect of mass D, perpendicularly sectioned      8 - 9 - inferior aspect of mass A, perpendicularly sectioned 10 - 14 - accessory lobe, submitted entirely and sequentially from superior to inferior      10 - superior aspect, perpendicularly sectioned      14 - inferior aspect, perpendicularly sectioned  RB 04/15/2021   Final Diagnosis performed by Betsy Pries, MD.   Electronically signed 04/19/2021 4:32:12PM The electronic signature indicates that the named At tending Pathologist has evaluated the specimen Technical component performed at [facility], [address], [place], [place] [address] Lab: [phone] Dir: Rush Farmer, MD, MMM  Professional component performed at [facility], [facility], [place], [place], [place] [address] Lab: [phone] Dir: Kathi Simpers, MD       Assessment & Plan:   Problem List Items Addressed This Visit   None Visit Diagnoses     Herpes zoster without complication    -  Primary   Will start Gabapentin TID to help with pain. Acyclovir given for shingles. Discussed s/s to follow up for and when to seek higher level of care.   Relevant Medications   acyclovir (ZOVIRAX) 800 MG tablet   Burning sensation       Will start  Gabapentin TID to help with pain. Discussed s/s of shingles. Discussed if the spot on her abdomen increases overnight call to start Acyclovir.        Follow up plan: Return if symptoms worsen or fail to improve.

## 2021-04-24 ENCOUNTER — Telehealth: Payer: Self-pay | Admitting: Surgery

## 2021-04-24 NOTE — Telephone Encounter (Signed)
Pathology d/w the pt. Right follicular cancer, no invasion and good prognosis. SHe is doing well but developed Shingles. No surgical complications. We will make referral to endocrine for further eval. I do not think she will benefit from Iodine ablation. She is very [employer]

## 2021-04-29 ENCOUNTER — Other Ambulatory Visit
Admission: RE | Admit: 2021-04-29 | Discharge: 2021-04-29 | Disposition: A | Discharge status: Home / Self Care | Payer: Medicare Other | Attending: Surgery | Admitting: Surgery

## 2021-04-29 ENCOUNTER — Telehealth: Payer: Self-pay

## 2021-04-29 ENCOUNTER — Ambulatory Visit (INDEPENDENT_AMBULATORY_CARE_PROVIDER_SITE_OTHER): Payer: Medicare Other | Admitting: Surgery

## 2021-04-29 ENCOUNTER — Other Ambulatory Visit: Payer: Self-pay

## 2021-04-29 ENCOUNTER — Encounter: Payer: Self-pay | Admitting: Surgery

## 2021-04-29 VITALS — BP 121/82 | HR 68 | Temp 98.7°F | Ht 61.0 in | Wt 194.2 lb

## 2021-04-29 DIAGNOSIS — E890 Postprocedural hypothyroidism: Secondary | ICD-10-CM | POA: Insufficient documentation

## 2021-04-29 DIAGNOSIS — Z09 Encounter for follow-up examination after completed treatment for conditions other than malignant neoplasm: Secondary | ICD-10-CM

## 2021-04-29 LAB — BASIC METABOLIC PANEL
Anion gap: 8 (ref 5–15)
BUN: 6 mg/dL (ref 6–20)
CO2: 27 mmol/L (ref 22–32)
Calcium: 9.1 mg/dL (ref 8.9–10.3)
Chloride: 102 mmol/L (ref 98–111)
Creatinine, Ser: 0.56 mg/dL (ref 0.44–1.00)
GFR, Estimated: >60 mL/min (ref >60)
Glucose, Bld: 98 mg/dL (ref 70–99)
Potassium: 3.7 mmol/L (ref 3.5–5.1)
Sodium: 137 mmol/L (ref 135–145)

## 2021-04-29 NOTE — Patient Instructions (Addendum)
Referral to Dr,Anna Solum at [facility]. You may call their office on Wednesday to schedule an appointment. Please go to the [facility] today at [place] through the [place] entrance.   Please see your follow up appointment listed below.

## 2021-04-29 NOTE — Progress Notes (Signed)
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 No surgical complications  PE NAD Neck: dermabond in place, no infection, some expected surgical induration , no hematomas, seroma or infection  A/P Doing very well Check thyroglobulin, tsh and bmp Endocrinology referral RTC 4-6 wks

## 2021-04-29 NOTE — Telephone Encounter (Signed)
Referral to Endocrinology at [facility] clinic faxed at this time-office notes, insurance information and post operative notes faxed.

## 2021-04-30 ENCOUNTER — Telehealth: Payer: Self-pay

## 2021-04-30 LAB — THYROID STIMULATING IMMUNOGLOBULIN: Thyroid Stimulating Immunoglob: 0.54 IU/L (ref 0.00–0.55)

## 2021-04-30 NOTE — Telephone Encounter (Signed)
Spoke with receptionist at [facility] Endocrinology  to let her know referral had been sent -she stated she will let Dr.Solum know.   Left message on patient's VM to call and schedule [phone]

## 2021-05-08 ENCOUNTER — Telehealth: Payer: Self-pay

## 2021-05-08 DIAGNOSIS — E890 Postprocedural hypothyroidism: Secondary | ICD-10-CM

## 2021-05-08 MED ORDER — LEVOTHYROXINE SODIUM 150 MCG PO TABS: 150.0000 ug | ORAL_TABLET | Freq: Every day | ORAL | 2 refills | Status: DC

## 2021-05-08 NOTE — Telephone Encounter (Signed)
Spoke with patient regarding referrals to [facility] clinic and [facility] Endocrinology- both offices are behind on referrals - patient stated she is not taking Thyroid medication because nothing has been called in. I let patient know I would check with Dr.Rodenberg about medication.

## 2021-05-08 NOTE — Telephone Encounter (Signed)
Left detailed message on VM letting patient know that Levothyroxine 150 mcg has been sent to pharmacy and she can start this today-reminded patient to keep scheduled appointment 05/29/21 @ 11am. Patient can call office with any questions or concerns.

## 2021-05-27 ENCOUNTER — Encounter: Payer: Self-pay | Admitting: Nurse Practitioner

## 2021-05-27 ENCOUNTER — Ambulatory Visit (INDEPENDENT_AMBULATORY_CARE_PROVIDER_SITE_OTHER): Payer: Medicare Other | Admitting: Nurse Practitioner

## 2021-05-27 ENCOUNTER — Other Ambulatory Visit: Payer: Self-pay

## 2021-05-27 VITALS — BP 117/69 | HR 66 | Temp 98.7°F | Ht 61.0 in | Wt 195.2 lb

## 2021-05-27 DIAGNOSIS — D696 Thrombocytopenia, unspecified: Secondary | ICD-10-CM

## 2021-05-27 DIAGNOSIS — E039 Hypothyroidism, unspecified: Secondary | ICD-10-CM | POA: Diagnosis not present

## 2021-05-27 DIAGNOSIS — J439 Emphysema, unspecified: Secondary | ICD-10-CM

## 2021-05-27 DIAGNOSIS — E890 Postprocedural hypothyroidism: Secondary | ICD-10-CM

## 2021-05-27 DIAGNOSIS — I2510 Atherosclerotic heart disease of native coronary artery without angina pectoris: Secondary | ICD-10-CM

## 2021-05-27 NOTE — Assessment & Plan Note (Signed)
S/p thyroidectomy.  Has not been to see Endocrinology. Will draw labs at visit today. Will reach out to referral coordinator to see if she is able to help patient get into Endocrinology.

## 2021-05-27 NOTE — Assessment & Plan Note (Signed)
Chronic.  Controlled.  Continue with current medication regimen.  Labs ordered today.  Return to clinic in 6 months for reevaluation.  She is currently smoking.  Call sooner if concerns arise.

## 2021-05-27 NOTE — Assessment & Plan Note (Signed)
Labs ordered today.  Will make recommendations based on lab results. ?

## 2021-05-27 NOTE — Progress Notes (Signed)
BP 117/69    Pulse 66    Temp 98.7 F (37.1 C) (Oral)    Ht _0  (1.549 m)    Wt 195 lb 3.2 oz (88.5 kg)    SpO2 97%    BMI 36.88 kg/m    Subjective:    Patient ID: Janet Murray, female    DOB: [birth year], 50 y.o.   MRN: [id number]  HPI: Janet Murray is a 50 y.o. female  Chief Complaint  Patient presents with   Hypothyroidism   COPD COPD status: controlled Satisfied with current treatment?: yes Oxygen use: no Dyspnea frequency:  Cough frequency:  Rescue inhaler frequency:   Limitation of activity: no Productive cough:  Last Spirometry:  Pneumovax: Not up to Date Influenza: Not up to Date She went back to smoking after the surgery. She has been very stressed lately.   HYPOTHYROIDISM Patient  states she is doing well after her thyroidectomy.  She is a little bit irritable and tired.  Not sleeping well at night. She follows up with the surgeon on the 21st.    Denies HA, CP, SOB, dizziness, palpitations, visual changes, and lower extremity swelling.  Relevant past medical, surgical, family and social history reviewed and updated as indicated. Interim medical history since our last visit reviewed. Allergies and medications reviewed and updated.  Review of Systems  Constitutional:  Negative for fever and unexpected weight change.  Eyes:  Negative for visual disturbance.  Respiratory:  Negative for cough, chest tightness and shortness of breath.   Cardiovascular:  Negative for chest pain, palpitations and leg swelling.  Endocrine: Negative for cold intolerance.  Neurological:  Negative for dizziness and headaches.   Per HPI unless specifically indicated above     Objective:    BP 117/69    Pulse 66    Temp 98.7 F (37.1 C) (Oral)    Ht _1  (1.549 m)    Wt 195 lb 3.2 oz (88.5 kg)    SpO2 97%    BMI 36.88 kg/m   Wt Readings from Last 3 Encounters:  05/27/21 195 lb 3.2 oz (88.5 kg)  04/29/21 194 lb 3.2 oz (88.1 kg)  04/22/21 193 lb (87.5 kg)    Physical  Exam Vitals and nursing note reviewed.  Constitutional:      General: She is not in acute distress.    Appearance: Normal appearance. She is normal weight. She is not ill-appearing, toxic-appearing or diaphoretic.  HENT:     Head: Normocephalic.     Right Ear: External ear normal.     Left Ear: External ear normal.     Nose: Nose normal.     Mouth/Throat:     Mouth: Mucous membranes are moist.     Pharynx: Oropharynx is clear.  Eyes:     General:        Right eye: No discharge.        Left eye: No discharge.     Extraocular Movements: Extraocular movements intact.     Conjunctiva/sclera: Conjunctivae normal.     Pupils: Pupils are equal, round, and reactive to light.  Cardiovascular:     Rate and Rhythm: Normal rate and regular rhythm.     Heart sounds: No murmur heard. Pulmonary:     Effort: Pulmonary effort is normal. No respiratory distress.     Breath sounds: Normal breath sounds. No wheezing or rales.  Musculoskeletal:     Cervical back: Normal range of motion and neck supple.  Skin:  General: Skin is warm and dry.     Capillary Refill: Capillary refill takes less than 2 seconds.  Neurological:     General: No focal deficit present.     Mental Status: She is alert and oriented to person, place, and time. Mental status is at baseline.  Psychiatric:        Mood and Affect: Mood normal.        Behavior: Behavior normal.        Thought Content: Thought content normal.        Judgment: Judgment normal.    Results for orders placed or performed during the hospital encounter of 12/75/17  Basic metabolic panel  Result Value Ref Range   Sodium 137 135 - 145 mmol/L   Potassium 3.7 3.5 - 5.1 mmol/L   Chloride 102 98 - 111 mmol/L   CO2 27 22 - 32 mmol/L   Glucose, Bld 98 70 - 99 mg/dL   BUN 6 6 - 20 mg/dL   Creatinine, Ser 0.56 0.44 - 1.00 mg/dL   Calcium 9.1 8.9 - 10.3 mg/dL   GFR, Estimated >60 >60 mL/min   Anion gap 8 5 - 15  Thyroid Stimulating Immunoglobulin   Result Value Ref Range   Thyroid Stimulating Immunoglob 0.54 0.00 - 0.55 IU/L      Assessment & Plan:   Problem List Items Addressed This Visit       Respiratory   COPD (chronic obstructive pulmonary disease) with emphysema ([place]) - Primary    Chronic.  Controlled.  Continue with current medication regimen.  Labs ordered today.  Return to clinic in 6 months for reevaluation.  She is currently smoking.  Call sooner if concerns arise.        Relevant Orders   Comp Met (CMET)   CBC w/Diff     Endocrine   Acquired hypothyroidism    S/p thyroidectomy.  Has not been to see Endocrinology. Will draw labs at visit today. Will reach out to referral coordinator to see if she is able to help patient get into Endocrinology.       Relevant Orders   Thyroid Panel With TSH     Hematopoietic and Hemostatic   Thrombocytopenia ([place])    Labs ordered today. Will make recommendations based on lab results.      Relevant Orders   CBC w/Diff     Other   S/P thyroidectomy    S/p thyroidectomy.  Has not been to see Endocrinology. Will draw labs at visit today. Will reach out to referral coordinator to see if she is able to help patient get into Endocrinology.       Relevant Orders   Thyroid Panel With TSH     Follow up plan: Return in about 3 months (around 08/25/2021) for hypothyroidism.

## 2021-05-28 ENCOUNTER — Telehealth: Payer: Self-pay

## 2021-05-28 ENCOUNTER — Encounter: Payer: Self-pay | Admitting: Nurse Practitioner

## 2021-05-28 LAB — COMPREHENSIVE METABOLIC PANEL
ALT: 6 IU/L (ref 0–32)
AST: 13 IU/L (ref 0–40)
Albumin/Globulin Ratio: 2 (ref 1.2–2.2)
Albumin: 4.6 g/dL (ref 3.8–4.8)
Alkaline Phosphatase: 104 IU/L (ref 44–121)
BUN/Creatinine Ratio: 9 (ref 9–23)
BUN: 6 mg/dL (ref 6–24)
Bilirubin Total: <0.2 mg/dL (ref 0.0–1.2)
CO2: 24 mmol/L (ref 20–29)
Calcium: 9.4 mg/dL (ref 8.7–10.2)
Chloride: 102 mmol/L (ref 96–106)
Creatinine, Ser: 0.64 mg/dL (ref 0.57–1.00)
Globulin, Total: 2.3 g/dL (ref 1.5–4.5)
Glucose: 85 mg/dL (ref 70–99)
Potassium: 3.9 mmol/L (ref 3.5–5.2)
Sodium: 139 mmol/L (ref 134–144)
Total Protein: 6.9 g/dL (ref 6.0–8.5)
eGFR: 108 mL/min/{1.73_m2} (ref >59)

## 2021-05-28 LAB — CBC WITH DIFFERENTIAL/PLATELET
Basophils Absolute: 0.1 10*3/uL (ref 0.0–0.2)
Basos: 2 %
EOS (ABSOLUTE): 0.2 10*3/uL (ref 0.0–0.4)
Eos: 3 %
Hematocrit: 39.2 % (ref 34.0–46.6)
Hemoglobin: 13.8 g/dL (ref 11.1–15.9)
Immature Grans (Abs): 0 10*3/uL (ref 0.0–0.1)
Immature Granulocytes: 0 %
Lymphocytes Absolute: 2.7 10*3/uL (ref 0.7–3.1)
Lymphs: 39 %
MCH: 34.3 pg — ABNORMAL HIGH (ref 26.6–33.0)
MCHC: 35.2 g/dL (ref 31.5–35.7)
MCV: 98 fL — ABNORMAL HIGH (ref 79–97)
Monocytes Absolute: 0.4 10*3/uL (ref 0.1–0.9)
Monocytes: 6 %
Neutrophils Absolute: 3.4 10*3/uL (ref 1.4–7.0)
Neutrophils: 50 %
RBC: 4.02 x10E6/uL (ref 3.77–5.28)
RDW: 13.2 % (ref 11.7–15.4)
WBC: 6.9 10*3/uL (ref 3.4–10.8)

## 2021-05-28 LAB — THYROID PANEL WITH TSH
Free Thyroxine Index: 1.5 (ref 1.2–4.9)
T3 Uptake Ratio: 22 % — ABNORMAL LOW (ref 24–39)
T4, Total: 6.9 ug/dL (ref 4.5–12.0)
TSH: 70.8 u[IU]/mL — ABNORMAL HIGH (ref 0.450–4.500)

## 2021-05-28 NOTE — Progress Notes (Signed)
Please let patient know that her thyroid is still not functioning well.  We need to increase her dose of levothyroxine to 233mcg.  I have sent in a prescription for the 11mcg to be taken with the 119mcg.  She should return to clinic if 6 weeks for repeat labs. Please make her a lab appt.

## 2021-05-28 NOTE — Addendum Note (Signed)
Addended by: Jon Billings on: 05/28/2021 08:38 AM   Modules accepted: Orders

## 2021-05-28 NOTE — Telephone Encounter (Signed)
Left message for patient to return call to office -checking to see if she has been notified from referral sent to Endocrinology at [facility] or [facility] Endocrinology and if so when is appointment.

## 2021-05-29 ENCOUNTER — Ambulatory Visit (INDEPENDENT_AMBULATORY_CARE_PROVIDER_SITE_OTHER): Payer: Medicare Other | Admitting: Surgery

## 2021-05-29 ENCOUNTER — Other Ambulatory Visit: Payer: Self-pay

## 2021-05-29 ENCOUNTER — Encounter: Payer: Self-pay | Admitting: Surgery

## 2021-05-29 VITALS — BP 108/74 | HR 71 | Temp 98.1°F | Ht 61.0 in | Wt 194.2 lb

## 2021-05-29 DIAGNOSIS — Z09 Encounter for follow-up examination after completed treatment for conditions other than malignant neoplasm: Secondary | ICD-10-CM

## 2021-05-29 DIAGNOSIS — E890 Postprocedural hypothyroidism: Secondary | ICD-10-CM

## 2021-05-29 NOTE — Patient Instructions (Addendum)
You have been placed on the recall list. Someone will call you in February to schedule your March appointment.   If you have any concerns or questions, please feel free to call our office.   Thyroidectomy, Care After This sheet gives you information about how to care for yourself after your procedure. Your health care provider may also give you more specific instructions. If you have problems or questions, contact your health care provider. What can I expect after the procedure? After the procedure, it is common to have: Mild pain in the neck or upper body, especially when swallowing. A swollen neck. A sore throat. A weak or hoarse voice. Slight tingling or numbness around your mouth, or in your fingers or toes. This may last for a day or two after surgery. This condition is caused by low levels of calcium. You may be given calcium supplements to treat it. Follow these instructions at home: Medicines Take over-the-counter and prescription medicines only as told by your health care provider. Do not drive or use heavy machinery while taking prescription pain medicine. Do not take medicines that contain aspirin and ibuprofen until your health care provider says that you can. These medicines can increase your risk of bleeding. Take a thyroid hormone medicine as recommended by your health care provider. You will have to take this medicine for the rest of your life if your entire thyroid was removed. Eating and drinking Start slowly with eating. You may need to have only liquids and soft foods for a few days or as directed by your health care provider. To prevent or treat constipation while you are taking prescription pain medicine, your health care provider may recommend that you: Drink enough fluid to keep your urine pale yellow. Take over-the-counter or prescription medicines. Eat foods that are high in fiber, such as fresh fruits and vegetables, whole grains, and beans. Limit foods that are  high in fat and processed sugars, such as fried and sweet foods. Incision care Follow instructions from your health care provider about how to take care of your incision. Make sure you: Wash your hands with soap and water before you change your bandage (dressing). If soap and water are not available, use hand sanitizer. Change your dressing as told by your health care provider. Leave stitches (sutures), skin glue, or adhesive strips in place. These skin closures may need to stay in place for 2 weeks or longer. If adhesive strip edges start to loosen and curl up, you may trim the loose edges. Do not remove adhesive strips completely unless your health care provider tells you to do that. Check your incision area every day for signs of infection. Check for: Redness, swelling, or pain. Fluid or blood. Warmth. Pus or a bad smell. Do not take baths, swim, or use a hot tub until your health care provider approves. Activity For the first 10 days after the procedure or as instructed by your health care provider: Do not lift anything that is heavier than 10 lb (4.5 kg). Do not jog, swim, or do other strenuous exercises. Do not play contact sports. Avoid sitting for a long time without moving. Get up to take short walks every 1-2 hours. This is needed to improve blood flow and breathing. Ask for help if you feel weak or unsteady. Return to your normal activities as told by your health care provider. Ask your health care provider what activities are safe for you. General instructions Do not use any products that contain nicotine  or tobacco, such as cigarettes and e-cigarettes. These can delay healing after surgery. If you need help quitting, ask your health care provider. Keep all follow-up visits as told by your health care provider. This is important. Your health care provider needs to monitor the calcium level in your blood to make sure that it does not become low. Contact a health care provider if  you: Have a fever. Have more redness, swelling, or pain around your incision area. Have fluid or blood coming from your incision area. Notice that your incision area feels warm to the touch. Have pus or a bad smell coming from your incision area. Have trouble talking. Have nausea or vomiting for more than 2 days. Get help right away if you: Have trouble breathing. Have trouble swallowing. Develop a rash. Develop a cough that gets worse. Notice that your speech changes, or you have hoarseness that gets worse. Develop numbness, tingling, or muscle spasms in the arms, hands, feet, or face. Summary After the procedure, it is common to feel mild pain in the neck or upper body, especially when swallowing. Take medicines as told by your health care provider. These include pain medicines and thyroid hormones, if required. Follow instructions from your health care provider about how to take care of your incision. Watch for signs of infection. Keep all follow-up visits as told by your health care provider. This is important. Your health care provider needs to monitor the calcium level in your blood to make sure that it does not become low. Get help right away if you develop difficulty breathing, or numbness, tingling, or muscle spasms in the arms, hands, feet, or face. This information is not intended to replace advice given to you by your health care provider. Make sure you discuss any questions you have with your health care provider. Document Revised: 02/02/2020 Document Reviewed: 02/02/2020 Elsevier Patient Education  [place].

## 2021-05-31 NOTE — Progress Notes (Signed)
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 Already addressed by PCP   PE NAD Neck: wound healing well no infection,  , no hematomas, seroma or infection   A/P Doing very well RTC 3 months or so

## 2021-06-05 ENCOUNTER — Other Ambulatory Visit (HOSPITAL_COMMUNITY): Payer: Self-pay

## 2021-06-06 ENCOUNTER — Telehealth: Payer: Self-pay

## 2021-06-06 ENCOUNTER — Other Ambulatory Visit (HOSPITAL_COMMUNITY): Payer: Self-pay

## 2021-06-06 NOTE — Telephone Encounter (Signed)
Patient Advocate Encounter   Received notification from [facility] that prior authorization for Albuterol Nebulizer is required by his/her insurance [employer].   PA submitted on 06/06/2021  Key#:  [id number]  Nebulizer may be covered under Med[employer]  Status is pending    [facility] will continue to follow:  Patient Advocate Fax:  [phone]

## 2021-06-06 NOTE — Telephone Encounter (Signed)
Received a fax regarding Prior Authorization from [place]  for ALBUTEROL NEB SOL. Authorization has been DENIED because COVERED UNDER PART B.

## 2021-06-06 NOTE — Telephone Encounter (Signed)
Sp[facility] to [place] [facility] and relayed below message. Anderson Malta stated that Rx went thru under part B and is ready for pickup.   Lm for patient.

## 2021-06-11 NOTE — Telephone Encounter (Signed)
Lm x2 for patient.  Will close encounter per office protocol.   

## 2021-06-12 ENCOUNTER — Other Ambulatory Visit: Payer: Self-pay

## 2021-06-12 ENCOUNTER — Encounter: Payer: Self-pay | Admitting: Nurse Practitioner

## 2021-06-12 ENCOUNTER — Ambulatory Visit (INDEPENDENT_AMBULATORY_CARE_PROVIDER_SITE_OTHER): Payer: Medicare Other | Admitting: Nurse Practitioner

## 2021-06-12 VITALS — BP 105/71 | HR 78 | Temp 98.6°F | Wt 194.6 lb

## 2021-06-12 DIAGNOSIS — J439 Emphysema, unspecified: Secondary | ICD-10-CM

## 2021-06-12 DIAGNOSIS — E039 Hypothyroidism, unspecified: Secondary | ICD-10-CM

## 2021-06-12 NOTE — Progress Notes (Signed)
BP 105/71    Pulse 78    Temp 98.6 F (37 C) (Oral)    Wt 194 lb 9.6 oz (88.3 kg)    SpO2 94%    BMI 36.77 kg/m    Subjective:    Patient ID: Janet Murray, female    DOB: 09/04/1970, 51 y.o.   MRN: [id number]  HPI: Janet Murray is a 51 y.o. female  Chief Complaint  Patient presents with   Hypothyroidism   COPD   COPD COPD status: controlled Satisfied with current treatment?: yes Oxygen use: no Dyspnea frequency:  Cough frequency:  Rescue inhaler frequency:   Limitation of activity: no Productive cough:  Last Spirometry:  Pneumovax: Not up to Date Influenza: Not up to Date   HYPOTHYROIDISM Patient  states she is doing well after her thyroidectomy.  She has followed up with the surgeon.  Is waiting to get an appointment with Endo.  Still feels tired.     Denies HA, CP, SOB, dizziness, palpitations, visual changes, and lower extremity swelling.  Relevant past medical, surgical, family and social history reviewed and updated as indicated. Interim medical history since our last visit reviewed. Allergies and medications reviewed and updated.  Review of Systems  Constitutional:  Positive for fatigue. Negative for fever and unexpected weight change.  Eyes:  Negative for visual disturbance.  Respiratory:  Negative for cough, chest tightness and shortness of breath.   Cardiovascular:  Negative for chest pain, palpitations and leg swelling.  Endocrine: Negative for cold intolerance.  Neurological:  Negative for dizziness and headaches.   Per HPI unless specifically indicated above     Objective:    BP 105/71    Pulse 78    Temp 98.6 F (37 C) (Oral)    Wt 194 lb 9.6 oz (88.3 kg)    SpO2 94%    BMI 36.77 kg/m   Wt Readings from Last 3 Encounters:  06/12/21 194 lb 9.6 oz (88.3 kg)  05/29/21 194 lb 3.2 oz (88.1 kg)  05/27/21 195 lb 3.2 oz (88.5 kg)    Physical Exam Vitals and nursing note reviewed.  Constitutional:      General: She is not in acute distress.     Appearance: Normal appearance. She is normal weight. She is not ill-appearing, toxic-appearing or diaphoretic.  HENT:     Head: Normocephalic.     Right Ear: External ear normal.     Left Ear: External ear normal.     Nose: Nose normal.     Mouth/Throat:     Mouth: Mucous membranes are moist.     Pharynx: Oropharynx is clear.  Eyes:     General:        Right eye: No discharge.        Left eye: No discharge.     Extraocular Movements: Extraocular movements intact.     Conjunctiva/sclera: Conjunctivae normal.     Pupils: Pupils are equal, round, and reactive to light.  Cardiovascular:     Rate and Rhythm: Normal rate and regular rhythm.     Heart sounds: No murmur heard. Pulmonary:     Effort: Pulmonary effort is normal. No respiratory distress.     Breath sounds: Normal breath sounds. No wheezing or rales.  Musculoskeletal:     Cervical back: Normal range of motion and neck supple.  Skin:    General: Skin is warm and dry.     Capillary Refill: Capillary refill takes less than 2 seconds.  Neurological:  General: No focal deficit present.     Mental Status: She is alert and oriented to person, place, and time. Mental status is at baseline.  Psychiatric:        Mood and Affect: Mood normal.        Behavior: Behavior normal.        Thought Content: Thought content normal.        Judgment: Judgment normal.    Results for orders placed or performed in visit on 05/27/21  Comp Met (CMET)  Result Value Ref Range   Glucose 85 70 - 99 mg/dL   BUN 6 6 - 24 mg/dL   Creatinine, Ser 0.64 0.57 - 1.00 mg/dL   eGFR 108 >59 mL/min/1.73   BUN/Creatinine Ratio 9 9 - 23   Sodium 139 134 - 144 mmol/L   Potassium 3.9 3.5 - 5.2 mmol/L   Chloride 102 96 - 106 mmol/L   CO2 24 20 - 29 mmol/L   Calcium 9.4 8.7 - 10.2 mg/dL   Total Protein 6.9 6.0 - 8.5 g/dL   Albumin 4.6 3.8 - 4.8 g/dL   Globulin, Total 2.3 1.5 - 4.5 g/dL   Albumin/Globulin Ratio 2.0 1.2 - 2.2   Bilirubin Total <0.2 0.0 -  1.2 mg/dL   Alkaline Phosphatase 104 44 - 121 IU/L   AST 13 0 - 40 IU/L   ALT 6 0 - 32 IU/L  CBC w/Diff  Result Value Ref Range   WBC 6.9 3.4 - 10.8 x10E3/uL   RBC 4.02 3.77 - 5.28 x10E6/uL   Hemoglobin 13.8 11.1 - 15.9 g/dL   Hematocrit 39.2 34.0 - 46.6 %   MCV 98 (H) 79 - 97 fL   MCH 34.3 (H) 26.6 - 33.0 pg   MCHC 35.2 31.5 - 35.7 g/dL   RDW 13.2 11.7 - 15.4 %   Platelets CANCELED x10E3/uL   Neutrophils 50 Not Estab. %   Lymphs 39 Not Estab. %   Monocytes 6 Not Estab. %   Eos 3 Not Estab. %   Basos 2 Not Estab. %   Neutrophils Absolute 3.4 1.4 - 7.0 x10E3/uL   Lymphocytes Absolute 2.7 0.7 - 3.1 x10E3/uL   Monocytes Absolute 0.4 0.1 - 0.9 x10E3/uL   EOS (ABSOLUTE) 0.2 0.0 - 0.4 x10E3/uL   Basophils Absolute 0.1 0.0 - 0.2 x10E3/uL   Immature Granulocytes 0 Not Estab. %   Immature Grans (Abs) 0.0 0.0 - 0.1 x10E3/uL   Hematology Comments: Note:   Thyroid Panel With TSH  Result Value Ref Range   TSH 70.800 (H) 0.450 - 4.500 uIU/mL   T4, Total 6.9 4.5 - 12.0 ug/dL   T3 Uptake Ratio 22 (L) 24 - 39 %   Free Thyroxine Index 1.5 1.2 - 4.9      Assessment & Plan:   Problem List Items Addressed This Visit       Respiratory   COPD (chronic obstructive pulmonary disease) with emphysema ([facility])    Chronic.  Controlled.  Continue with current medication regimen.  Return to clinic in 3 months for reevaluation.  She is currently smoking.  Call sooner if concerns arise.          Endocrine   Acquired hypothyroidism - Primary    Chronic. Ongoing. Thyroidectomy on September 21. Last TSH was 70 2 weeks ago.  Dose was increased at that time. She will have labs done in 1 month.  Waiting to get into Endocrinology. Will continue to manage until she is able to  see them. She would like to discuss weight loss.  She has previously been on Victoza but no longer able to take due to thyroid carcinoma. Will let Endocrinology weigh in on weight loss.        Follow up plan: Return in about 3  months (around 09/10/2021) for hypothyroidism.

## 2021-06-12 NOTE — Assessment & Plan Note (Signed)
Chronic.  Controlled.  Continue with current medication regimen.  Return to clinic in 3 months for reevaluation.  She is currently smoking.  Call sooner if concerns arise.

## 2021-06-12 NOTE — Assessment & Plan Note (Signed)
Chronic. Ongoing. Thyroidectomy on September 21. Last TSH was 70 2 weeks ago.  Dose was increased at that time. She will have labs done in 1 month.  Waiting to get into Endocrinology. Will continue to manage until she is able to see them. She would like to discuss weight loss.  She has previously been on Victoza but no longer able to take due to thyroid carcinoma. Will let Endocrinology weigh in on weight loss.

## 2021-06-19 ENCOUNTER — Other Ambulatory Visit: Payer: Self-pay | Admitting: Nurse Practitioner

## 2021-06-19 DIAGNOSIS — M25519 Pain in unspecified shoulder: Secondary | ICD-10-CM | POA: Diagnosis not present

## 2021-06-19 DIAGNOSIS — G8929 Other chronic pain: Secondary | ICD-10-CM | POA: Diagnosis not present

## 2021-06-19 DIAGNOSIS — R519 Headache, unspecified: Secondary | ICD-10-CM | POA: Diagnosis not present

## 2021-06-19 DIAGNOSIS — M792 Neuralgia and neuritis, unspecified: Secondary | ICD-10-CM | POA: Diagnosis not present

## 2021-06-19 DIAGNOSIS — M48062 Spinal stenosis, lumbar region with neurogenic claudication: Secondary | ICD-10-CM | POA: Diagnosis not present

## 2021-06-19 DIAGNOSIS — M47897 Other spondylosis, lumbosacral region: Secondary | ICD-10-CM | POA: Diagnosis not present

## 2021-06-19 DIAGNOSIS — I119 Hypertensive heart disease without heart failure: Secondary | ICD-10-CM | POA: Diagnosis not present

## 2021-06-19 DIAGNOSIS — Z833 Family history of diabetes mellitus: Secondary | ICD-10-CM | POA: Diagnosis not present

## 2021-06-19 DIAGNOSIS — R209 Unspecified disturbances of skin sensation: Secondary | ICD-10-CM | POA: Diagnosis not present

## 2021-06-19 DIAGNOSIS — R0989 Other specified symptoms and signs involving the circulatory and respiratory systems: Secondary | ICD-10-CM | POA: Diagnosis not present

## 2021-06-19 DIAGNOSIS — K219 Gastro-esophageal reflux disease without esophagitis: Secondary | ICD-10-CM

## 2021-06-19 NOTE — Telephone Encounter (Signed)
Requested Prescriptions  Pending Prescriptions Disp Refills   esomeprazole (NEXIUM) 40 MG capsule [Pharmacy Med Name: ESOMEPRAZOLE MAGNESIUM 40 MG CAP] 180 capsule 0    Sig: TAKE ONE CAPSULE BY MOUTH TWICE A DAY     Gastroenterology: Proton Pump Inhibitors Passed - 06/19/2021 10:44 AM      Passed - Valid encounter within last 12 months    Recent Outpatient Visits          1 week ago Acquired hypothyroidism   [facility] Jon Billings, NP   3 weeks ago Pulmonary emphysema, unspecified emphysema type ([place])   [facility] Jon Billings, NP   1 month ago Herpes zoster without complication   [facility] Jon Billings, NP   2 months ago Pulmonary emphysema, unspecified emphysema type ([place])   [place], Karen, NP   3 months ago Acquired hypothyroidism   [facility] Jon Billings, NP      Future Appointments            In 2 months Jon Billings, NP [facility], [place]   In 2 months Jon Billings, NP [facility], [facility]            pramipexole (MIRAPEX) 0.5 MG tablet [Pharmacy Med Name: PRAMIPEXOLE DIHYDROCHLORIDE 0.5 MG] 360 tablet 1    Sig: [place]     Neurology:  Parkinsonian Agents Passed - 06/19/2021 10:44 AM      Passed - Last BP in normal range    BP Readings from Last 1 Encounters:  06/12/21 105/71         Passed - Valid encounter within last 12 months    Recent Outpatient Visits          1 week ago Acquired hypothyroidism   [facility] Jon Billings, NP   3 weeks ago Pulmonary emphysema, unspecified emphysema type ([place])   [facility] Jon Billings, NP   1 month ago Herpes zoster without complication   [facility] Jon Billings, NP   2 months ago Pulmonary emphysema, unspecified emphysema type ([place])   [facility] Jon Billings, NP   3 months ago  Acquired hypothyroidism   [facility] Jon Billings, NP      Future Appointments            In 2 months Jon Billings, NP [facility], [place]   In 2 months Jon Billings, NP [place], [facility]            montelukast (SINGULAIR) 10 MG tablet [Pharmacy Med Name: MONTELUKAST SODIUM 10 MG TAB] 90 tablet     Sig: TAKE ONE TABLET BY MOUTH AT BEDTIME     Pulmonology:  Leukotriene Inhibitors Passed - 06/19/2021 10:44 AM      Passed - Valid encounter within last 12 months    Recent Outpatient Visits          1 week ago Acquired hypothyroidism   [facility] Jon Billings, NP   3 weeks ago Pulmonary emphysema, unspecified emphysema type ([place])   [facility] Jon Billings, NP   1 month ago Herpes zoster without complication   [facility] Jon Billings, NP   2 months ago Pulmonary emphysema, unspecified emphysema type ([place])   [place], Karen, NP   3 months ago Acquired hypothyroidism   [facility] Jon Billings, NP      Future  Appointments            In 2 months Jon Billings, NP [facility], [place]   In 2 months Jon Billings, NP [facility], [place]

## 2021-06-19 NOTE — Telephone Encounter (Signed)
Requested medication (s) are due for refill today - expired Rx  Requested medication (s) are on the active medication list yes/no  Future visit scheduled -yes  Last refill: pramipexole-10/28/19 #360 1RF                  Montelukast- no longer on current list  Notes to clinic: Request RF: expired Rx  Requested Prescriptions  Pending Prescriptions Disp Refills   pramipexole (MIRAPEX) 0.5 MG tablet [Pharmacy Med Name: PRAMIPEXOLE DIHYDROCHLORIDE 0.5 MG] 360 tablet 1    Sig: TAKE ONE TABLET BY [place]     Neurology:  Parkinsonian Agents Passed - 06/19/2021 10:44 AM      Passed - Last BP in normal range    BP Readings from Last 1 Encounters:  06/12/21 105/71          Passed - Valid encounter within last 12 months    Recent Outpatient Visits           1 week ago Acquired hypothyroidism   [facility] Jon Billings, NP   3 weeks ago Pulmonary emphysema, unspecified emphysema type ([place])   [facility] Jon Billings, NP   1 month ago Herpes zoster without complication   [facility] Jon Billings, NP   2 months ago Pulmonary emphysema, unspecified emphysema type ([place])   [place], Karen, NP   3 months ago Acquired hypothyroidism   [facility] Jon Billings, NP       Future Appointments             In 2 months Jon Billings, NP [facility], [place]   In 2 months Jon Billings, NP [facility], [facility]             montelukast (SINGULAIR) 10 MG tablet [Pharmacy Med Name: MONTELUKAST SODIUM 10 MG TAB] 90 tablet     Sig: TAKE ONE TABLET BY MOUTH AT BEDTIME     Pulmonology:  Leukotriene Inhibitors Passed - 06/19/2021 10:44 AM      Passed - Valid encounter within last 12 months    Recent Outpatient Visits           1 week ago Acquired hypothyroidism   [facility] Jon Billings, NP   3 weeks ago Pulmonary emphysema, unspecified  emphysema type ([place])   [facility] Jon Billings, NP   1 month ago Herpes zoster without complication   [facility] Jon Billings, NP   2 months ago Pulmonary emphysema, unspecified emphysema type ([place])   [facility] Jon Billings, NP   3 months ago Acquired hypothyroidism   [facility] Jon Billings, NP       Future Appointments             In 2 months Jon Billings, NP [facility], [place]   In 2 months Jon Billings, NP [employer], [facility]            Signed Prescriptions Disp Refills   esomeprazole (NEXIUM) 40 MG capsule 180 capsule 0    Sig: TAKE ONE CAPSULE BY MOUTH TWICE A DAY     Gastroenterology: Proton Pump Inhibitors Passed - 06/19/2021 10:44 AM      Passed - Valid encounter within last 12 months    Recent Outpatient Visits           1 week ago Acquired hypothyroidism   [facility] Jon Billings, NP   3 weeks  ago Pulmonary emphysema, unspecified emphysema type ([place])   [facility] Jon Billings, NP   1 month ago Herpes zoster without complication   [facility] Jon Billings, NP   2 months ago Pulmonary emphysema, unspecified emphysema type ([place])   [facility] Jon Billings, NP   3 months ago Acquired hypothyroidism   [facility] Jon Billings, NP       Future Appointments             In 2 months Jon Billings, NP [facility], [place]   In 2 months Jon Billings, NP [employer], [facility]               Requested Prescriptions  Pending Prescriptions Disp Refills   pramipexole (MIRAPEX) 0.5 MG tablet [Pharmacy Med Name: PRAMIPEXOLE DIHYDROCHLORIDE 0.5 MG] 360 tablet 1    Sig: [place]     Neurology:  Parkinsonian Agents Passed - 06/19/2021 10:44 AM      Passed - Last BP in normal range    BP Readings from Last 1  Encounters:  06/12/21 105/71          Passed - Valid encounter within last 12 months    Recent Outpatient Visits           1 week ago Acquired hypothyroidism   [facility] Jon Billings, NP   3 weeks ago Pulmonary emphysema, unspecified emphysema type ([place])   [facility] Jon Billings, NP   1 month ago Herpes zoster without complication   [facility] Jon Billings, NP   2 months ago Pulmonary emphysema, unspecified emphysema type ([place])   [facility] Jon Billings, NP   3 months ago Acquired hypothyroidism   [facility] Jon Billings, NP       Future Appointments             In 2 months Jon Billings, NP [facility], [place]   In 2 months Jon Billings, NP [facility]             montelukast (SINGULAIR) 10 MG tablet [Pharmacy Med Name: MONTELUKAST SODIUM 10 MG TAB] 90 tablet     Sig: TAKE ONE TABLET BY MOUTH AT BEDTIME     Pulmonology:  Leukotriene Inhibitors Passed - 06/19/2021 10:44 AM      Passed - Valid encounter within last 12 months    Recent Outpatient Visits           1 week ago Acquired hypothyroidism   [facility] Jon Billings, NP   3 weeks ago Pulmonary emphysema, unspecified emphysema type ([place])   [facility] Jon Billings, NP   1 month ago Herpes zoster without complication   [facility] Jon Billings, NP   2 months ago Pulmonary emphysema, unspecified emphysema type ([place])   [facility] Jon Billings, NP   3 months ago Acquired hypothyroidism   [facility] Jon Billings, NP       Future Appointments             In 2 months Jon Billings, NP [facility]   In 2 months Jon Billings, NP [facility]            Signed Prescriptions Disp Refills   esomeprazole (NEXIUM) 40 MG capsule 180 capsule 0     Sig: TAKE ONE CAPSULE BY MOUTH TWICE A DAY  Gastroenterology: Proton Pump Inhibitors Passed - 06/19/2021 10:44 AM      Passed - Valid encounter within last 12 months    Recent Outpatient Visits           1 week ago Acquired hypothyroidism   [facility] Jon Billings, NP   3 weeks ago Pulmonary emphysema, unspecified emphysema type ([place])   [facility] Jon Billings, NP   1 month ago Herpes zoster without complication   [facility] Jon Billings, NP   2 months ago Pulmonary emphysema, unspecified emphysema type ([place])   [facility] Jon Billings, NP   3 months ago Acquired hypothyroidism   [facility] Jon Billings, NP       Future Appointments             In 2 months Jon Billings, NP [facility], [place]   In 2 months Jon Billings, NP [facility], [place]

## 2021-06-25 DIAGNOSIS — M7600 Gluteal tendinitis, unspecified hip: Secondary | ICD-10-CM | POA: Diagnosis not present

## 2021-06-25 DIAGNOSIS — G894 Chronic pain syndrome: Secondary | ICD-10-CM | POA: Diagnosis not present

## 2021-06-25 DIAGNOSIS — G8929 Other chronic pain: Secondary | ICD-10-CM | POA: Diagnosis not present

## 2021-06-25 DIAGNOSIS — M9904 Segmental and somatic dysfunction of sacral region: Secondary | ICD-10-CM | POA: Diagnosis not present

## 2021-06-27 ENCOUNTER — Telehealth: Payer: Self-pay

## 2021-06-27 NOTE — Telephone Encounter (Signed)
Spoke with receptionist at [facility] today regarding this patients referral-not accepting new patients at this timleft message for patient to return my call to office-to remind her to listen out for call from [facility] endocrinology and until she has an appointment to continue following up with Thyroid medication with PCP.

## 2021-06-28 ENCOUNTER — Telehealth: Payer: Self-pay | Admitting: Nurse Practitioner

## 2021-06-28 NOTE — Telephone Encounter (Signed)
PT states that she was seen by pain management and stated that the patient will need an order for an MRI of the Lumbar. Please advise.

## 2021-06-28 NOTE — Telephone Encounter (Signed)
Routing to provider to advise. Does the patient need an appointment for this?

## 2021-06-28 NOTE — Telephone Encounter (Signed)
Patient is aware of below message and voiced her understanding.  Nothing further needed.   

## 2021-06-28 NOTE — Telephone Encounter (Signed)
Already spoke with patient she needs to call pain management and have them fax over her last office note so I can order the MRI.

## 2021-06-28 NOTE — Telephone Encounter (Signed)
Patient called back and is aware of this information, she also spoke with her PCP yesterday as well.

## 2021-07-01 ENCOUNTER — Encounter: Payer: Self-pay | Admitting: Nurse Practitioner

## 2021-07-03 ENCOUNTER — Telehealth (INDEPENDENT_AMBULATORY_CARE_PROVIDER_SITE_OTHER): Payer: Medicare Other | Admitting: Nurse Practitioner

## 2021-07-03 ENCOUNTER — Encounter: Payer: Self-pay | Admitting: Nurse Practitioner

## 2021-07-03 ENCOUNTER — Ambulatory Visit: Payer: Medicare Other | Admitting: Nurse Practitioner

## 2021-07-03 DIAGNOSIS — J309 Allergic rhinitis, unspecified: Secondary | ICD-10-CM | POA: Diagnosis not present

## 2021-07-03 MED ORDER — PREDNISONE 10 MG PO TABS: 10.0000 mg | ORAL_TABLET | Freq: Every day | ORAL | 0 refills | Status: DC

## 2021-07-03 NOTE — Progress Notes (Signed)
There were no vitals taken for this visit.   Subjective:    Patient ID: Janet Murray, female    DOB: 10-20-1970, 51 y.o.   MRN: [id number]  HPI: Janet Murray is a 51 y.o. female  Chief Complaint  Patient presents with   URI    Pt states she has been having a cough, congestion, and sinus pressure since Sunday.    UPPER RESPIRATORY TRACT INFECTION Worst symptom: COVID negative Fever: no Cough: yes Shortness of breath: no Wheezing: no Chest pain: yes, with cough Chest tightness: no Chest congestion: no Nasal congestion: no Runny nose: yes Post nasal drip: yes Sneezing: no Sore throat: no Swollen glands: no Sinus pressure: yes Headache: no Face pain: no Toothache: no Ear pain: no bilateral Ear pressure: no bilateral Eyes red/itching:no Eye drainage/crusting: no  Vomiting: no Rash: no Fatigue: yes Sick contacts: no Strep contacts: no  Context: worse Recurrent sinusitis: no   Relevant past medical, surgical, family and social history reviewed and updated as indicated. Interim medical history since our last visit reviewed. Allergies and medications reviewed and updated.  Review of Systems  Constitutional:  Negative for fatigue and fever.  HENT:  Positive for congestion, postnasal drip and rhinorrhea. Negative for dental problem, ear pain, sinus pressure, sinus pain, sneezing and sore throat.   Respiratory:  Positive for cough. Negative for shortness of breath and wheezing.   Cardiovascular:  Negative for chest pain.  Gastrointestinal:  Negative for vomiting.  Skin:  Negative for rash.  Neurological:  Negative for headaches.   Per HPI unless specifically indicated above     Objective:    There were no vitals taken for this visit.  Wt Readings from Last 3 Encounters:  06/12/21 194 lb 9.6 oz (88.3 kg)  05/29/21 194 lb 3.2 oz (88.1 kg)  05/27/21 195 lb 3.2 oz (88.5 kg)    Physical Exam Vitals and nursing note reviewed.  Constitutional:      General: She  is not in acute distress.    Appearance: She is not ill-appearing.  HENT:     Head: Normocephalic.     Right Ear: Hearing normal.     Left Ear: Hearing normal.     Nose: Nose normal.  Pulmonary:     Effort: Pulmonary effort is normal. No respiratory distress.  Neurological:     Mental Status: She is alert.  Psychiatric:        Mood and Affect: Mood normal.        Behavior: Behavior normal.        Thought Content: Thought content normal.        Judgment: Judgment normal.    Results for orders placed or performed in visit on 05/27/21  Comp Met (CMET)  Result Value Ref Range   Glucose 85 70 - 99 mg/dL   BUN 6 6 - 24 mg/dL   Creatinine, Ser 0.64 0.57 - 1.00 mg/dL   eGFR 108 >59 mL/min/1.73   BUN/Creatinine Ratio 9 9 - 23   Sodium 139 134 - 144 mmol/L   Potassium 3.9 3.5 - 5.2 mmol/L   Chloride 102 96 - 106 mmol/L   CO2 24 20 - 29 mmol/L   Calcium 9.4 8.7 - 10.2 mg/dL   Total Protein 6.9 6.0 - 8.5 g/dL   Albumin 4.6 3.8 - 4.8 g/dL   Globulin, Total 2.3 1.5 - 4.5 g/dL   Albumin/Globulin Ratio 2.0 1.2 - 2.2   Bilirubin Total <0.2 0.0 - 1.2 mg/dL  Alkaline Phosphatase 104 44 - 121 IU/L   AST 13 0 - 40 IU/L   ALT 6 0 - 32 IU/L  CBC w/Diff  Result Value Ref Range   WBC 6.9 3.4 - 10.8 x10E3/uL   RBC 4.02 3.77 - 5.28 x10E6/uL   Hemoglobin 13.8 11.1 - 15.9 g/dL   Hematocrit 39.2 34.0 - 46.6 %   MCV 98 (H) 79 - 97 fL   MCH 34.3 (H) 26.6 - 33.0 pg   MCHC 35.2 31.5 - 35.7 g/dL   RDW 13.2 11.7 - 15.4 %   Platelets CANCELED x10E3/uL   Neutrophils 50 Not Estab. %   Lymphs 39 Not Estab. %   Monocytes 6 Not Estab. %   Eos 3 Not Estab. %   Basos 2 Not Estab. %   Neutrophils Absolute 3.4 1.4 - 7.0 x10E3/uL   Lymphocytes Absolute 2.7 0.7 - 3.1 x10E3/uL   Monocytes Absolute 0.4 0.1 - 0.9 x10E3/uL   EOS (ABSOLUTE) 0.2 0.0 - 0.4 x10E3/uL   Basophils Absolute 0.1 0.0 - 0.2 x10E3/uL   Immature Granulocytes 0 Not Estab. %   Immature Grans (Abs) 0.0 0.0 - 0.1 x10E3/uL   Hematology  Comments: Note:   Thyroid Panel With TSH  Result Value Ref Range   TSH 70.800 (H) 0.450 - 4.500 uIU/mL   T4, Total 6.9 4.5 - 12.0 ug/dL   T3 Uptake Ratio 22 (L) 24 - 39 %   Free Thyroxine Index 1.5 1.2 - 4.9      Assessment & Plan:   Problem List Items Addressed This Visit   None Visit Diagnoses     Allergic rhinitis, unspecified seasonality, unspecified trigger    -  Primary   Recommend OTC zyrtec. Continue with Singulair. Use albuterol PRN for SOB/Wheezing. Follow up if symptoms do not improve, can order chest xray.        Follow up plan: No follow-ups on file.   This visit was completed via MyChart due to the restrictions of the COVID-19 pandemic. All issues as above were discussed and addressed. Physical exam was done as above through visual confirmation on MyChart. If it was felt that the patient should be evaluated in the office, they were directed there. The patient verbally consented to this visit. Location of the patient: Home Location of the provider: Office Those involved with this call:  Provider: Jon Billings, NP CMA: Yvonna Alanis, [place] Desk/Registration: Myrlene Broker This encounter was conducted via video.  I spent 15 dedicated to the care of this patient on the date of this encounter to include previsit review of 20, face to face time with the patient, and post visit ordering of testing.

## 2021-07-15 ENCOUNTER — Other Ambulatory Visit: Payer: Self-pay

## 2021-07-15 ENCOUNTER — Other Ambulatory Visit: Payer: Medicare Other

## 2021-07-15 DIAGNOSIS — E039 Hypothyroidism, unspecified: Secondary | ICD-10-CM | POA: Diagnosis not present

## 2021-07-15 DIAGNOSIS — E890 Postprocedural hypothyroidism: Secondary | ICD-10-CM | POA: Diagnosis not present

## 2021-07-15 NOTE — Progress Notes (Signed)
There were no vitals taken for this visit.   Subjective:    Patient ID: Janet Murray, female    DOB: [birth year], 51 y.o.   MRN: [id number]  HPI: [name] is a 51 y.o. female  Chief Complaint  Patient presents with   Back Pain   BACK PAIN Patient has been seeing Dr. Primus Bravo for pain management.  Dr. Primus Bravo would like an update MRI of lumbar spine to evaluate the nerve pain in patient's legs.  She has known Spondylosis, she has herniated discs at L3, L4 and L5.    Patient has received a nerve block, medication and injections to help with the nerve pain but it has continued to worsen.  Denies loss of bowel or bladder control.   Relevant past medical, surgical, family and social history reviewed and updated as indicated. Interim medical history since our last visit reviewed. Allergies and medications reviewed and updated.  Review of Systems  Musculoskeletal:  Positive for back pain.  Neurological:        Leg burning and numbness and tingling   Per HPI unless specifically indicated above     Objective:    There were no vitals taken for this visit.  Wt Readings from Last 3 Encounters:  06/12/21 194 lb 9.6 oz (88.3 kg)  05/29/21 194 lb 3.2 oz (88.1 kg)  05/27/21 195 lb 3.2 oz (88.5 kg)    Physical Exam Vitals and nursing note reviewed.  HENT:     Head: Normocephalic.     Right Ear: Hearing normal.     Left Ear: Hearing normal.     Nose: Nose normal.  Eyes:     Pupils: Pupils are equal, round, and reactive to light.  Pulmonary:     Effort: Pulmonary effort is normal. No respiratory distress.  Neurological:     Mental Status: She is alert.  Psychiatric:        Mood and Affect: Mood normal.        Behavior: Behavior normal.        Thought Content: Thought content normal.        Judgment: Judgment normal.    Results for orders placed or performed in visit on 07/15/21  Thyroid Panel With TSH  Result Value Ref Range   TSH 1.200 0.450 - 4.500 uIU/mL   T4, Total 9.1  4.5 - 12.0 ug/dL   T3 Uptake Ratio 30 24 - 39 %   Free Thyroxine Index 2.7 1.2 - 4.9  T4, free  Result Value Ref Range   Free T4 1.73 0.82 - 1.77 ng/dL      Assessment & Plan:   Problem List Items Addressed This Visit       Nervous and Auditory   Osteoarthritis of spine with radiculopathy, lumbar region - Primary    Patient has been seen by Dr. Primus Bravo for many years for ongoing back pain and Radiculopathy. Pain has worsened despite injections, nerve block and medication management. Pain management doctor would like updated MRI to evaluate pain before determining future management options.  No evidence of update lumbar xray.  Ordered xray during visit. Also ordered MRI to evaluate pain. Will send to pain management for further evaluation and management.       Relevant Orders   MR Lumbar Spine Wo Contrast   DG Lumbar Spine Complete   Other spondylosis with radiculopathy, lumbar region    Patient has been seen by Dr. Primus Bravo for many years for ongoing back pain and  Radiculopathy. Pain has worsened despite injections, nerve block and medication management. Pain management doctor would like updated MRI to evaluate pain before determining future management options.  No evidence of update lumbar xray.  Ordered xray during visit. Also ordered MRI to evaluate pain. Will send to pain management for further evaluation and management.       Relevant Orders   MR Lumbar Spine Wo Contrast   DG Lumbar Spine Complete   Radiculopathy of lumbar region    Patient has been seen by Dr. Primus Bravo for many years for ongoing back pain and Radiculopathy. Pain has worsened despite injections, nerve block and medication management. Pain management doctor would like updated MRI to evaluate pain before determining future management options.  No evidence of update lumbar xray.  Ordered xray during visit. Also ordered MRI to evaluate pain. Will send to pain management for further evaluation and management.       Relevant  Orders   MR Lumbar Spine Wo Contrast   DG Lumbar Spine Complete     Musculoskeletal and Integument   Other intervertebral disc degeneration, lumbar region   Relevant Orders   MR Lumbar Spine Wo Contrast   DG Lumbar Spine Complete     Follow up plan: Return if symptoms worsen or fail to improve.   This visit was completed via MyChart due to the restrictions of the COVID-19 pandemic. All issues as above were discussed and addressed. Physical exam was done as above through visual confirmation on MyChart. If it was felt that the patient should be evaluated in the office, they were directed there. The patient verbally consented to this visit. Location of the patient: Home Location of the provider: Office Those involved with this call:  Provider: Jon Billings, NP CMA: Frazier Butt, CMA Front Desk/Registration: Myrlene Broker This encounter was conducted via video.  I spent 15 dedicated to the care of this patient on the date of this encounter to include previsit review of 20, face to face time with the patient, and post visit ordering of testing.

## 2021-07-16 ENCOUNTER — Encounter: Payer: Self-pay | Admitting: Nurse Practitioner

## 2021-07-16 ENCOUNTER — Telehealth (INDEPENDENT_AMBULATORY_CARE_PROVIDER_SITE_OTHER): Payer: Medicare Other | Admitting: Nurse Practitioner

## 2021-07-16 DIAGNOSIS — M4726 Other spondylosis with radiculopathy, lumbar region: Secondary | ICD-10-CM | POA: Diagnosis not present

## 2021-07-16 DIAGNOSIS — M5416 Radiculopathy, lumbar region: Secondary | ICD-10-CM | POA: Diagnosis not present

## 2021-07-16 DIAGNOSIS — M5136 Other intervertebral disc degeneration, lumbar region: Secondary | ICD-10-CM

## 2021-07-16 DIAGNOSIS — M51369 Other intervertebral disc degeneration, lumbar region without mention of lumbar back pain or lower extremity pain: Secondary | ICD-10-CM

## 2021-07-16 LAB — T4, FREE: Free T4: 1.73 ng/dL (ref 0.82–1.77)

## 2021-07-16 LAB — THYROID PANEL WITH TSH
Free Thyroxine Index: 2.7 (ref 1.2–4.9)
T3 Uptake Ratio: 30 % (ref 24–39)
T4, Total: 9.1 ug/dL (ref 4.5–12.0)
TSH: 1.2 u[IU]/mL (ref 0.450–4.500)

## 2021-07-16 NOTE — Assessment & Plan Note (Signed)
Patient has been seen by Dr. Primus Bravo for many years for ongoing back pain and Radiculopathy. Pain has worsened despite injections, nerve block and medication management. Pain management doctor would like updated MRI to evaluate pain before determining future management options.  No evidence of update lumbar xray.  Ordered xray during visit. Also ordered MRI to evaluate pain. Will send to pain management for further evaluation and management.

## 2021-07-16 NOTE — Progress Notes (Signed)
Hi Niesha.  Your labs looks good.  Continue with your current dose of Levothyroxine.  Let me know if you have any questions.

## 2021-07-18 ENCOUNTER — Ambulatory Visit
Admission: RE | Admit: 2021-07-18 | Discharge: 2021-07-18 | Disposition: A | Discharge status: Home / Self Care | Payer: Medicare Other | Source: Ambulatory Visit | Attending: Nurse Practitioner | Admitting: Nurse Practitioner

## 2021-07-18 ENCOUNTER — Ambulatory Visit
Admission: RE | Admit: 2021-07-18 | Discharge: 2021-07-18 | Disposition: A | Discharge status: Home / Self Care | Payer: Medicare Other | Attending: Nurse Practitioner | Admitting: Nurse Practitioner

## 2021-07-18 ENCOUNTER — Inpatient Hospital Stay: Admit: 2021-07-18 | Payer: Medicaid Other

## 2021-07-18 ENCOUNTER — Other Ambulatory Visit: Payer: Self-pay

## 2021-07-18 DIAGNOSIS — M51369 Other intervertebral disc degeneration, lumbar region without mention of lumbar back pain or lower extremity pain: Secondary | ICD-10-CM

## 2021-07-18 DIAGNOSIS — M5136 Other intervertebral disc degeneration, lumbar region: Secondary | ICD-10-CM | POA: Diagnosis not present

## 2021-07-18 DIAGNOSIS — M5416 Radiculopathy, lumbar region: Secondary | ICD-10-CM | POA: Insufficient documentation

## 2021-07-18 DIAGNOSIS — M545 Low back pain, unspecified: Secondary | ICD-10-CM | POA: Diagnosis not present

## 2021-07-18 DIAGNOSIS — R202 Paresthesia of skin: Secondary | ICD-10-CM | POA: Diagnosis not present

## 2021-07-18 DIAGNOSIS — M4726 Other spondylosis with radiculopathy, lumbar region: Secondary | ICD-10-CM | POA: Insufficient documentation

## 2021-07-18 DIAGNOSIS — M79604 Pain in right leg: Secondary | ICD-10-CM | POA: Diagnosis not present

## 2021-07-22 NOTE — Progress Notes (Signed)
Hi Janet Murray. Your xray showed some Lumber Hypertrophy which is a widening of the joint.  Does not give a reason for the pain as expected.  We will proceed with the MRI.

## 2021-07-23 DIAGNOSIS — G894 Chronic pain syndrome: Secondary | ICD-10-CM | POA: Diagnosis not present

## 2021-07-23 DIAGNOSIS — M5136 Other intervertebral disc degeneration, lumbar region: Secondary | ICD-10-CM | POA: Diagnosis not present

## 2021-07-23 DIAGNOSIS — M545 Low back pain, unspecified: Secondary | ICD-10-CM | POA: Diagnosis not present

## 2021-07-23 DIAGNOSIS — M7600 Gluteal tendinitis, unspecified hip: Secondary | ICD-10-CM | POA: Diagnosis not present

## 2021-07-24 ENCOUNTER — Ambulatory Visit: Payer: Medicaid Other | Admitting: Endocrinology

## 2021-07-29 ENCOUNTER — Ambulatory Visit: Payer: Self-pay

## 2021-07-29 NOTE — Telephone Encounter (Signed)
Spoke with Danton Clap from [facility] and was informed she is not aware where the 40 MG came in to the call. But, she was calling because she says the patient may be confused. Danton Clap says they have only fill one prescription of the Levothyroxine 150 MCG and they have never filled a 75 MCG. Danton Clap says if the patient is taking both prescription for Levothyroxine 75 MCG for the patient sent to their pharmacy. Please advise?

## 2021-07-29 NOTE — Telephone Encounter (Addendum)
Janet Murray from [place] says ws told by dr Mathis Dad that she should take both 40 mg  levothyroxine (SYNTHROID) and 75 MCG but they only have the prescriptioin for 40 mg. Please call back     [place] states they never received the order for levothyroxine 75 mcg . They have the 150 mcg dose. Please advise.

## 2021-07-29 NOTE — Telephone Encounter (Signed)
Can someone please verify what this message is supposed to say?  Patient should be on Levothyroxine 160mcg which was prescribed by Dr. Dahlia Byes.    There is no prescription for 40mg  of Levothyroxine, do they mean Nexium 40mg ?

## 2021-07-30 NOTE — Telephone Encounter (Signed)
Patient is taking 148mcg of levothyroxine.

## 2021-07-30 NOTE — Telephone Encounter (Signed)
Pharmacy notified.

## 2021-07-31 ENCOUNTER — Ambulatory Visit (INDEPENDENT_AMBULATORY_CARE_PROVIDER_SITE_OTHER): Payer: Medicare Other | Admitting: Endocrinology

## 2021-07-31 ENCOUNTER — Encounter: Payer: Self-pay | Admitting: Endocrinology

## 2021-07-31 ENCOUNTER — Other Ambulatory Visit: Payer: Self-pay

## 2021-07-31 DIAGNOSIS — C73 Malignant neoplasm of thyroid gland: Secondary | ICD-10-CM | POA: Diagnosis not present

## 2021-07-31 NOTE — Progress Notes (Signed)
Subjective:    Patient ID: Janet Murray, female    DOB: 1970/11/16, 51 y.o.   MRN: [id number]  HPI Pt is ref by Jon Billings, NP, for [facility].  She had thyroidect 11/22.  She takes oral Ca++, 1 tab per day, but not Vit-D.  She takes synthroid 150/d, since surgery.  Pt says neck is healing well. Main symptom is fatigue.   Past Medical History:  Diagnosis Date   Abdominal mass, RUQ (right upper quadrant)    Anxiety    Arthritis    Asthma    Bronchitis    Carpal tunnel syndrome of left wrist 10/25/2014   Chronic bronchitis, obstructive (HCC) 07/20/2017   Chronic pain    COPD (chronic obstructive pulmonary disease) (HCC)    Coronary artery disease    Cyst of right kidney 03/22/2018   DDD (degenerative disc disease), lumbar 10/25/2014   Degenerative joint disease (DJD) of hip    Diarrhea of infectious origin 03/25/2018   Dysphagia    Dyspnea    Emphysema of lung (HCC)    Facet syndrome, lumbar 10/25/2014   Fatty liver 03/25/2018   Foot fracture, left 05/02/2015   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   GERD (gastroesophageal reflux disease)    Hereditary and idiopathic peripheral neuropathy 11/06/2014   Hypothyroidism    IBS (irritable bowel syndrome)    Low back pain    Migraine 02/04/2016   Occipital neuralgia    Osteoarthritis of spine with radiculopathy, lumbar region 10/25/2014   Other intervertebral disc degeneration, lumbar region    Other irritable bowel syndrome 04/20/2017   Other spondylosis with radiculopathy, lumbar region 10/25/2014   RLS (restless legs syndrome)    Sacroiliac joint dysfunction of both sides 10/25/2014   Spondylosis of lumbar spine    Urinary incontinence     Past Surgical History:  Procedure Laterality Date   APPENDECTOMY     BLADDER SURGERY     sling - Dr. Davis Gourd   CESAREAN SECTION     COLONOSCOPY WITH PROPOFOL N/A 04/11/2020   Procedure: COLONOSCOPY WITH PROPOFOL;  Surgeon: Virgel Manifold, MD;  Location: [facility] ENDOSCOPY;  Service:  Endoscopy;  Laterality: N/A;   ESOPHAGOGASTRODUODENOSCOPY  04/26/2019   STOMACH SURGERY     THYROIDECTOMY N/A 04/12/2021   Procedure: THYROIDECTOMY, total;  Surgeon: Jules Husbands, MD;  Location: [facility] ORS;  Service: General;  Laterality: N/A;   TOTAL ABDOMINAL HYSTERECTOMY W/ BILATERAL SALPINGOOPHORECTOMY      Social History   Socioeconomic History   Marital status: Single    Spouse name: Not on file   Number of children: 2   Years of education: Not on file   Highest education level: Not on file  Occupational History   Not on file  Tobacco Use   Smoking status: Every Day    Packs/day: 0.25    Years: 32.00    Pack years: 8.00    Types: Cigarettes   Smokeless tobacco: Never   Tobacco comments:    0.5PPD 02/27/2021  Vaping Use   Vaping Use: Never used  Substance and Sexual Activity   Alcohol use: Not Currently    Alcohol/week: 0.0 standard drinks    Comment: occassionally   Drug use: No   Sexual activity: Not Currently  Other Topics Concern   Not on file  Social History Narrative   Lives alone   Social Determinants of Health   Financial Resource Strain: Low Risk    Difficulty of Paying Living Expenses: Not hard at  all  Food Insecurity: No Food Insecurity   Worried About Charity fundraiser in the Last Year: Never true   Ran Out of Food in the Last Year: Never true  Transportation Needs: No Transportation Needs   Lack of Transportation (Medical): No   Lack of Transportation (Non-Medical): No  Physical Activity: Insufficiently Active   Days of Exercise per Week: 3 days   Minutes of Exercise per Session: 20 min  Stress: No Stress Concern Present   Feeling of Stress : Only a little  Social Connections: Moderately Isolated   Frequency of Communication with Friends and Family: More than three times a week   Frequency of Social Gatherings with Friends and Family: More than three times a week   Attends Religious Services: Never   Marine scientist or Organizations:  Yes   Attends Archivist Meetings: Never   Marital Status: Divorced  Human resources officer Violence: Not At Risk   Fear of Current or Ex-Partner: No   Emotionally Abused: No   Physically Abused: No   Sexually Abused: No    Current Outpatient Medications on File Prior to Visit  Medication Sig Dispense Refill   albuterol (PROVENTIL) (2.5 MG/3ML) 0.083% nebulizer solution Take 3 mLs (2.5 mg total) by nebulization every 6 (six) hours as needed for wheezing or shortness of breath. 75 mL 12   AMBULATORY NON FORMULARY MEDICATION Medication Name: nebulizer DX: J44.9 1 each 0   atorvastatin (LIPITOR) 80 MG tablet Take 1 tablet (80 mg total) by mouth daily. 90 tablet 3   calcium carbonate (OS-CAL - DOSED IN MG OF ELEMENTAL CALCIUM) 1250 (500 Ca) MG tablet Take 1 tablet (500 mg of elemental calcium total) by mouth 3 (three) times daily with meals. 60 tablet 0   diclofenac Sodium (VOLTAREN) 1 % GEL Apply 1 application topically daily as needed (Knee pain).     esomeprazole (NEXIUM) 40 MG capsule TAKE ONE CAPSULE BY MOUTH TWICE A DAY 180 capsule 0   Glycopyrrolate-Formoterol (BEVESPI AEROSPHERE) 9-4.8 MCG/ACT AERO INHALE 2 PUFFS BY MOUTH INTO THE LUNGS 2TIMES DAILY 10.7 g 11   hydrOXYzine (ATARAX/VISTARIL) 25 MG tablet Take 1 tablet (25 mg total) by mouth 3 (three) times daily as needed. (Patient taking differently: Take 25 mg by mouth 3 (three) times daily as needed for anxiety.) 30 tablet 5   Insulin Pen Needle 32G X 4 MM MISC 1 Units by Does not apply route every morning. Pen needles 90 each 3   levothyroxine (SYNTHROID) 150 MCG tablet Take 1 tablet (150 mcg total) by mouth daily before breakfast. 30 tablet 2   montelukast (SINGULAIR) 10 MG tablet TAKE ONE TABLET BY MOUTH AT BEDTIME 90 tablet 1   naloxone (NARCAN) nasal spray 4 mg/0.1 mL SMARTSIG:Both Nares     nicotine (NICOTROL) 10 MG inhaler Inhale 1 Cartridge (1 continuous puffing total) into the lungs as needed for smoking cessation. Inhale  every 1-2 hours as needed. 42 each 1   ondansetron (ZOFRAN-ODT) 4 MG disintegrating tablet TAKE 1 TABLET BY MOUTH EVERY 8 HOURS AS NEEDED FOR UP TO 5 DAYS 30 tablet 0   oxyCODONE (OXY IR/ROXICODONE) 5 MG immediate release tablet Take 1 tablet (5 mg total) by mouth every 4 (four) hours as needed for severe pain. 20 tablet 0   pramipexole (MIRAPEX) 0.5 MG tablet TAKE ONE TABLET BY MOUTH THREE TIMES A DAY 360 tablet 1   VENTOLIN HFA 108 (90 Base) MCG/ACT inhaler INHALE 2 PUFFS BY MOUTH INTO THE  LUNGS EVERY 6 HOURS AS NEEDED FOR WHEEZING OR SHORTNESS OF BREATH (Patient taking differently: Inhale 2 puffs into the lungs every 4 (four) hours as needed (COPD).) 18 g 2   No current facility-administered medications on file prior to visit.    Allergies  Allergen Reactions   Penicillins Hives and Swelling    Has patient had a PCN reaction causing immediate rash, facial/tongue/throat swelling, SOB or lightheadedness with hypotension: Yes Has patient had a PCN reaction causing severe rash involving mucus membranes or skin necrosis: No Has patient had a PCN reaction that required hospitalization No Has patient had a PCN reaction occurring within the last 10 years: No If all of the above answers are "NO", then may proceed with Cephalosporin use.   Acetaminophen     Due to polyps    Ciprofloxacin Itching   Aspirin Palpitations    Heart palpitations   Hydrocodone Hives and Rash   Other Rash    bandaids   Tape Rash    bandaids bandaids    Family History  Problem Relation Age of Onset   Neuropathy Mother    Arthritis Father    Hypertension Father    Leukemia Father    Heart disease Father        CAD s/p CABG   Healthy Brother    Healthy Daughter    Healthy Son    Dementia Maternal Grandmother    Hypertension Maternal Grandmother    Hypercholesterolemia Maternal Grandmother    Heart disease Maternal Grandmother    Healthy Brother    Healthy Brother    Stroke Neg Hx    Cancer Neg Hx      BP 118/70 (BP Location: Left Arm, Patient Position: Sitting, Cuff Size: Normal)    Pulse 82    Ht 5' 1" (1.549 m)    Wt 191 lb 3.2 oz (86.7 kg)    SpO2 95%    BMI 36.13 kg/m    Review of Systems No numbness, except Near surgical scar.      Objective:   Physical Exam VITAL SIGNS:  See vs page GENERAL: no distress Neck: a healed scar is present.  I do not appreciate a nodule in the thyroid or elsewhere in the neck.    Lab Results  Component Value Date   TSH 1.200 07/15/2021   T4TOTAL 9.1 07/15/2021    Pathol: right FTC, 1.6 x 1.2 x 0.8 cm (T1b), with left adenoma  I have reviewed outside records, and summarized: Pt was noted to have PTC, and referred here.  She was rx'ed Ca++ tabs and synthroid postoperatively  25-OH Vit-D=8    Assessment & Plan:  PTC: we discussed rx options.  She chooses to have RAI Hypothyroidism: recheck today Vit-D def: uncontrolled.  I advised 5000 units/d  Patient Instructions  Blood tests are requested for you today.  We'll let you know about the results.   I have ordered for you a treatment pill of radioactive iodine.  You would have 2 injections before this, to simulate being off the the levothyroxine.  you will notice no symptoms from this.  The pill is gone from your body in a few days (during which you should stay away from other people), but takes several months to work.  You would also return some days later, to lie down in front of the camera.  Please come back for a follow-up appointment in May.

## 2021-07-31 NOTE — Patient Instructions (Addendum)
Blood tests are requested for you today.  We'll let you know about the results.   I have ordered for you a treatment pill of radioactive iodine.  You would have 2 injections before this, to simulate being off the the levothyroxine.  you will notice no symptoms from this.  The pill is gone from your body in a few days (during which you should stay away from other people), but takes several months to work.  You would also return some days later, to lie down in front of the camera.  Please come back for a follow-up appointment in May.

## 2021-08-01 ENCOUNTER — Ambulatory Visit
Admission: RE | Admit: 2021-08-01 | Discharge: 2021-08-01 | Disposition: A | Discharge status: Home / Self Care | Payer: Medicare Other | Source: Ambulatory Visit | Attending: Nurse Practitioner | Admitting: Nurse Practitioner

## 2021-08-01 DIAGNOSIS — M47816 Spondylosis without myelopathy or radiculopathy, lumbar region: Secondary | ICD-10-CM | POA: Diagnosis not present

## 2021-08-01 DIAGNOSIS — M5416 Radiculopathy, lumbar region: Secondary | ICD-10-CM | POA: Insufficient documentation

## 2021-08-01 DIAGNOSIS — M48061 Spinal stenosis, lumbar region without neurogenic claudication: Secondary | ICD-10-CM | POA: Diagnosis not present

## 2021-08-01 DIAGNOSIS — M4726 Other spondylosis with radiculopathy, lumbar region: Secondary | ICD-10-CM | POA: Diagnosis not present

## 2021-08-01 DIAGNOSIS — M5136 Other intervertebral disc degeneration, lumbar region: Secondary | ICD-10-CM | POA: Insufficient documentation

## 2021-08-01 DIAGNOSIS — M5117 Intervertebral disc disorders with radiculopathy, lumbosacral region: Secondary | ICD-10-CM | POA: Diagnosis not present

## 2021-08-01 LAB — VITAMIN D 25 HYDROXY (VIT D DEFICIENCY, FRACTURES): VITD: 7.83 ng/mL — ABNORMAL LOW (ref 30.00–100.00)

## 2021-08-01 LAB — PTH, INTACT AND CALCIUM
Calcium: 9.6 mg/dL (ref 8.6–10.4)
PTH: 30 pg/mL (ref 16–77)

## 2021-08-02 ENCOUNTER — Telehealth: Payer: Self-pay

## 2021-08-02 NOTE — Progress Notes (Signed)
Please let patient know that her MRI shows mild degenerative changes in the lumbar spine as well as narrowing of the spinal canal at L4-L5.  Recommend she follow up with her pain management doctor.

## 2021-08-02 NOTE — Telephone Encounter (Signed)
Pt given lab results per notes of Santiago Glad, NP on 08/02/21. Pt verbalized understanding.

## 2021-08-04 DIAGNOSIS — G894 Chronic pain syndrome: Secondary | ICD-10-CM | POA: Diagnosis not present

## 2021-08-14 ENCOUNTER — Encounter: Payer: Self-pay | Admitting: Pulmonary Disease

## 2021-08-14 ENCOUNTER — Other Ambulatory Visit: Payer: Self-pay

## 2021-08-14 ENCOUNTER — Ambulatory Visit (INDEPENDENT_AMBULATORY_CARE_PROVIDER_SITE_OTHER): Payer: Medicare Other | Admitting: Pulmonary Disease

## 2021-08-14 VITALS — BP 108/78 | HR 79 | Temp 97.2°F | Ht 62.0 in | Wt 190.6 lb

## 2021-08-14 DIAGNOSIS — J4489 Other specified chronic obstructive pulmonary disease: Secondary | ICD-10-CM

## 2021-08-14 DIAGNOSIS — F17200 Nicotine dependence, unspecified, uncomplicated: Secondary | ICD-10-CM

## 2021-08-14 DIAGNOSIS — J439 Emphysema, unspecified: Secondary | ICD-10-CM

## 2021-08-14 DIAGNOSIS — E890 Postprocedural hypothyroidism: Secondary | ICD-10-CM | POA: Diagnosis not present

## 2021-08-14 DIAGNOSIS — F17210 Nicotine dependence, cigarettes, uncomplicated: Secondary | ICD-10-CM | POA: Diagnosis not present

## 2021-08-14 DIAGNOSIS — Z9889 Other specified postprocedural states: Secondary | ICD-10-CM

## 2021-08-14 DIAGNOSIS — J449 Chronic obstructive pulmonary disease, unspecified: Secondary | ICD-10-CM

## 2021-08-14 NOTE — Progress Notes (Signed)
Subjective:    Patient ID: Janet Murray, female    DOB: 11-Jun-1970, 51 y.o.   MRN: [id number] Patient Care Team: Jon Billings, NP as PCP - General Jacolyn Reedy, Sherrine Maples, MD as Consulting Physician (Anesthesiology) Jamelle Rushing, MD as Referring Physician (Dermatology) Harl Bowie Lucilla Edin, MD as Referring Physician (Gastroenterology)  Chief Complaint  Patient presents with   Follow-up     HPI Patient is a 51 year old current smoker (half PPD) who presents for follow-up on the issue of COPD and shortness of breath on exertion.  Patient was last seen here on 27 February 2021.  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  She is following with endocrinology and oncology for this issue.  PFTs were ordered at that time are as noted below.  In the past her PFTs had shown significant reversibility however, she no longer exhibits this on subsequent PFTs.  She is intolerant of ICS due to difficulties with thrush.  Today with no change in the character of her cough or dyspnea.  Notes occasional wheezing.  This response to albuterol inhaler.  She is free contemplative with regards to discontinuation of smoking.  She has not had any chest pain, no orthopnea or paroxysmal nocturnal dyspnea.  No lower extremity edema nor calf tenderness.  She does not endorse any other symptomatology today.  She wants to be referred to lung cancer screening however, may not be a candidate due to recent thyroid cancer diagnosis.  DATA: 10/29/2017 PFT: FEV1 0.66 L or 27% predicted, FVC 1.18 L or 41% predicted, FEV1/FVC 56%, there is significant bronchodilator response with a net change of 58% in FEV1.  Post FEV1 1.04 L or 43% predicted.  Diffusion capacity moderately reduced. 03/14/2021 PFTs: FEV1 1.33 L or 50% predicted, FVC 1.94 L or 58% predicted, FEV1/FVC 68%, no bronchodilator response,  lung volumes show hyperinflation and air trapping ERV 25%, consistent with obesity.  Diffusion capacity normal  Review of Systems A 10 point review of systems was performed and it is as noted above otherwise negative.    Objective:   Physical Exam BP 108/78 (BP Location: Left Arm, Patient Position: Sitting, Cuff Size: Normal)   Pulse 79   Temp (!) 97.2 F (36.2 C) (Oral)   Ht '5\' 2"'$  (1.575 m)   Wt 190 lb 9.6 oz (86.5 kg)   SpO2 98%   BMI 34.86 kg/m   GENERAL: Somewhat disheveled appearing woman, no acute distress.  Presents in transport chair. HEAD: Normocephalic, atraumatic.  EYES: Pupils equal, round, reactive to light.  No scleral icterus.  MOUTH: Nose/mouth/throat not examined due to COVID-19 masking requirements. NECK: Supple.  Absent thyroid. Trachea midline. No JVD.  No adenopathy. PULMONARY: Good air entry bilaterally.  No adventitious sounds. CARDIOVASCULAR: S1 and S2. Regular rate and rhythm.  ABDOMEN: Benign. MUSCULOSKELETAL: No joint deformity, no clubbing, no edema.  NEUROLOGIC: No overt focal deficit SKIN: Intact,warm,dry. PSYCH: Somewhat anxious mood, normal behavior       Assessment & Plan:     ICD-10-CM   1. COPD with chronic bronchitis and emphysema ([place])  J44.9    Continue Bevespi, continue albuterol as needed Continue Singulair QUIT SMOKING!    2. Tobacco dependence due to cigarettes  F17.210 Ambulatory Referral for Lung Cancer Scre   Patient counseled with regards to discontinuation of smoking Referred to lung cancer screening, eligibility questionable ([place] dx)  3. S/P thyroidectomy  E89.0    November [id number] Follicular carcinoma, minimally invasive Follows with oncology and endocrinology     Will see the patient in follow-up in 6 months time she is to contact prior to that time should any new difficulties arise.  Janet Don, MD Advanced Bronchoscopy [facility]    *This note was dictated using voice recognition  software/Dragon.  Despite best efforts to proofread, errors can occur which can change the meaning. Any transcriptional errors that result from this process are unintentional and may not be fully corrected at the time of dictation.

## 2021-08-14 NOTE — Patient Instructions (Signed)
We are referring you to the lung cancer screening program they will contact you to set up a CT. ? ?Continue using your Bevespi and Singulair. ? ?Try some Zyrtec at nighttime to see if that helps with the mild congestion or having in your throat. ? ?We will see you in follow-up in 6 months time call sooner should any new problems arise. ?

## 2021-08-16 ENCOUNTER — Telehealth: Payer: Self-pay

## 2021-08-16 NOTE — Written Directive (Deleted)
A user error has taken place: written directive to be removed. ?

## 2021-08-16 NOTE — Telephone Encounter (Signed)
Left message advising patient she can call central scheduling to get her NM study scheduled as it has received insurance authorization. ?

## 2021-08-19 NOTE — Written Directive (Deleted)
MOLECULAR IMAGING AND THERAPEUTICS WRITTEN DIRECTIVE ? ? ?PATIENT NAME: Janet Murray ? ?PT DOB:   1970-08-15 ?                                             ?MRN: [id number] ? ?--------------------------------------------------------------------------------------------------------------------- ? ? ?I-131 THYROID CANCER THERAPY ? ? ?RADIOPHARMACEUTICAL:  Iodine-131 Capsule  ? ? ?PRESCRIBED DOSE FOR ADMINISTRATION:  ? ? ?ROUTE OFADMINISTRATION:  PO ? ? ?DIAGNOSIS: Follicular cancer of thyroid ? ? ?REFERRING PHYSICIAN: Dr. Loanne Drilling ? ? ?THYROGEN STIMULATION OR HORMONE WITHDRAW: With Thyrogen ? ? ?REMNANT ABLATION OR ADJUVANT THERAPY: ? ? ?DATE OF THYROIDECTOMY: 04/12/2021 ? ? ?SURGEON: Dr. Dahlia Byes ? ? ?TSH:   ?Lab Results  ?Component Value Date  ? TSH 1.200 07/15/2021  ? TSH 70.800 (H) 05/27/2021  ? TSH 5.440 (H) 02/21/2021  ? ? ? ?PRIOR I-131 THERAPY (Date and Dose): ? ? ?Pathology: ? ?Cell type: '[]'$   Papillary  '[x]'$   Follicular  '[]'$   [place]  ? ?Largest tumor focus:      cm ? ?Extrathyroidal Extension?     Yes '[]'$   No '[x]'$    ? ?Lymphovascular Invasion?  Yes  '[]'$   No  '[x]'$    ? ?Margins positive ? Yes '[]'$   No '[x]'$    ? ?Lymph nodes positive? Yes '[]'$   No  '[x]'$     ? ? ?# positive nodes:   ?# negative nodes:   ? ? ?TNM staging: pT:         PN:         Mx: ? ?  ?ADDITIONAL PHYSICIAN COMMENTS/NOTES ? ? ?AUTHORIZED USER SIGNATURE & TIME STAMP: ?

## 2021-08-19 NOTE — Written Directive (Cosign Needed)
MOLECULAR IMAGING AND THERAPEUTICS WRITTEN DIRECTIVE   PATIENT NAME: Janet Murray  PT DOB:   Oct 16, 1970                                              MRN: [id number]  ---------------------------------------------------------------------------------------------------------------------   I-131 THYROID CANCER THERAPY   RADIOPHARMACEUTICAL:  Iodine-131 Capsule    PRESCRIBED DOSE FOR ADMINISTRATION:    ROUTE OFADMINISTRATION:  PO   DIAGNOSIS: Follicular cancer of thyroid   REFERRING PHYSICIAN: Dr. Renato Shin   THYROGEN STIMULATION OR HORMONE WITHDRAW: With Thyrogen   REMNANT ABLATION OR ADJUVANT THERAPY:   DATE OF THYROIDECTOMY: 04/12/2021   SURGEON: Dr. Dahlia Byes   TSH:   Lab Results  Component Value Date   TSH 1.200 07/15/2021   TSH 70.800 (H) 05/27/2021   TSH 5.440 (H) 02/21/2021     PRIOR I-131 THERAPY (Date and Dose):   Pathology:  Cell type: '[]'$   Papillary  '[x]'$   Follicular  '[]'$   [place]   Largest tumor focus:      cm  Extrathyroidal Extension?     Yes '[]'$   No '[x]'$     Lymphovascular Invasion?  Yes  '[]'$   No  '[x]'$     Margins positive ? Yes '[]'$   No '[x]'$     Lymph nodes positive? Yes '[]'$   No  '[x]'$       # positive nodes:   # negative nodes:     TNM staging: pT:         PN:         Mx:    ADDITIONAL PHYSICIAN COMMENTS/NOTES   AUTHORIZED USER SIGNATURE & TIME STAMP:

## 2021-08-20 DIAGNOSIS — M5481 Occipital neuralgia: Secondary | ICD-10-CM | POA: Diagnosis not present

## 2021-08-20 DIAGNOSIS — G8929 Other chronic pain: Secondary | ICD-10-CM | POA: Diagnosis not present

## 2021-08-20 DIAGNOSIS — G894 Chronic pain syndrome: Secondary | ICD-10-CM | POA: Diagnosis not present

## 2021-08-20 DIAGNOSIS — M9904 Segmental and somatic dysfunction of sacral region: Secondary | ICD-10-CM | POA: Diagnosis not present

## 2021-08-24 ENCOUNTER — Other Ambulatory Visit: Payer: Self-pay | Admitting: Nurse Practitioner

## 2021-08-26 ENCOUNTER — Ambulatory Visit: Payer: Medicare Other | Admitting: Nurse Practitioner

## 2021-08-26 ENCOUNTER — Ambulatory Visit
Admission: RE | Admit: 2021-08-26 | Discharge: 2021-08-26 | Disposition: A | Discharge status: Home / Self Care | Payer: Medicare Other | Source: Ambulatory Visit | Attending: Nurse Practitioner | Admitting: Nurse Practitioner

## 2021-08-26 ENCOUNTER — Other Ambulatory Visit: Payer: Self-pay

## 2021-08-26 DIAGNOSIS — Z1231 Encounter for screening mammogram for malignant neoplasm of breast: Secondary | ICD-10-CM | POA: Insufficient documentation

## 2021-08-26 NOTE — Telephone Encounter (Signed)
Requested Prescriptions  ?Pending Prescriptions Disp Refills  ?? levothyroxine (SYNTHROID) 150 MCG tablet [Pharmacy Med Name: LEVOTHYROXINE SODIUM 150 MCG TAB] 30 tablet 2  ?  Sig: TAKE 1 TABLET BY MOUTH DAILY BEFORE BREAKFAST.  ?  ? Endocrinology:  Hypothyroid Agents Passed - 08/24/2021 10:53 AM  ?  ?  Passed - TSH in normal range and within 360 days  ?  TSH  ?Date Value Ref Range Status  ?07/15/2021 1.200 0.450 - 4.500 uIU/mL Final  ?   ?  ?  Passed - Valid encounter within last 12 months  ?  Recent Outpatient Visits   ?      ? 1 month ago Osteoarthritis of spine with radiculopathy, lumbar region  ? [place], NP  ? 1 month ago Allergic rhinitis, unspecified seasonality, unspecified trigger  ? [place], NP  ? 2 months ago Acquired hypothyroidism  ? [place], NP  ? 3 months ago Pulmonary emphysema, unspecified emphysema type ([place])  ? [place], NP  ? 4 months ago Herpes zoster without complication  ? [facility] Jon Billings, NP  ?  ?  ?Future Appointments   ?        ? In 1 month Jon Billings, NP [facility]  ?  ? ?  ?  ?  ? ? ?

## 2021-08-27 ENCOUNTER — Other Ambulatory Visit: Payer: Self-pay | Admitting: Nurse Practitioner

## 2021-08-27 DIAGNOSIS — N6489 Other specified disorders of breast: Secondary | ICD-10-CM

## 2021-08-27 NOTE — Progress Notes (Signed)
Order placed for diagnostic mammogram

## 2021-08-28 ENCOUNTER — Other Ambulatory Visit: Payer: Self-pay | Admitting: Nurse Practitioner

## 2021-08-28 DIAGNOSIS — R928 Other abnormal and inconclusive findings on diagnostic imaging of breast: Secondary | ICD-10-CM

## 2021-08-28 DIAGNOSIS — N6489 Other specified disorders of breast: Secondary | ICD-10-CM

## 2021-09-02 ENCOUNTER — Other Ambulatory Visit: Payer: Self-pay

## 2021-09-02 ENCOUNTER — Encounter
Admission: RE | Admit: 2021-09-02 | Discharge: 2021-09-02 | Disposition: A | Discharge status: Home / Self Care | Payer: Medicare Other | Source: Ambulatory Visit | Attending: Endocrinology | Admitting: Endocrinology

## 2021-09-02 DIAGNOSIS — C73 Malignant neoplasm of thyroid gland: Secondary | ICD-10-CM | POA: Insufficient documentation

## 2021-09-02 MED ORDER — THYROTROPIN ALFA 0.9 MG IM SOLR
0.9000 mg | INTRAMUSCULAR | Status: AC
  Administered 2021-09-02: 0.9 mg via INTRAMUSCULAR

## 2021-09-03 ENCOUNTER — Encounter
Admission: RE | Admit: 2021-09-03 | Discharge: 2021-09-03 | Disposition: A | Discharge status: Home / Self Care | Payer: Medicare Other | Source: Ambulatory Visit | Attending: Endocrinology | Admitting: Endocrinology

## 2021-09-03 DIAGNOSIS — G894 Chronic pain syndrome: Secondary | ICD-10-CM | POA: Diagnosis not present

## 2021-09-03 DIAGNOSIS — C73 Malignant neoplasm of thyroid gland: Secondary | ICD-10-CM | POA: Diagnosis not present

## 2021-09-03 MED ORDER — THYROTROPIN ALFA 0.9 MG IM SOLR
0.9000 mg | INTRAMUSCULAR | Status: AC
  Administered 2021-09-03: 0.9 mg via INTRAMUSCULAR

## 2021-09-04 ENCOUNTER — Encounter
Admission: RE | Admit: 2021-09-04 | Discharge: 2021-09-04 | Disposition: A | Discharge status: Home / Self Care | Payer: Medicare Other | Source: Ambulatory Visit | Attending: Endocrinology | Admitting: Endocrinology

## 2021-09-04 ENCOUNTER — Other Ambulatory Visit: Payer: Self-pay

## 2021-09-04 ENCOUNTER — Inpatient Hospital Stay
Admission: RE | Admit: 2021-09-04 | Discharge: 2021-09-04 | Disposition: A | Discharge status: Home / Self Care | Payer: Medicare Other | Source: Ambulatory Visit | Attending: Endocrinology | Admitting: Endocrinology

## 2021-09-04 DIAGNOSIS — C73 Malignant neoplasm of thyroid gland: Secondary | ICD-10-CM | POA: Diagnosis not present

## 2021-09-04 MED ORDER — SODIUM IODIDE I 131 CAPSULE
53.1500 | Freq: Once | INTRAVENOUS | Status: AC | PRN
  Administered 2021-09-04: 53.15 via ORAL

## 2021-09-06 DIAGNOSIS — G894 Chronic pain syndrome: Secondary | ICD-10-CM | POA: Diagnosis not present

## 2021-09-11 ENCOUNTER — Ambulatory Visit: Payer: Commercial Managed Care - HMO | Admitting: Nurse Practitioner

## 2021-09-11 ENCOUNTER — Encounter
Admission: RE | Admit: 2021-09-11 | Discharge: 2021-09-11 | Disposition: A | Discharge status: Home / Self Care | Payer: Medicare Other | Source: Ambulatory Visit | Attending: Endocrinology | Admitting: Endocrinology

## 2021-09-11 ENCOUNTER — Encounter: Payer: Self-pay | Admitting: Endocrinology

## 2021-09-11 DIAGNOSIS — C73 Malignant neoplasm of thyroid gland: Secondary | ICD-10-CM | POA: Insufficient documentation

## 2021-09-16 ENCOUNTER — Ambulatory Visit (INDEPENDENT_AMBULATORY_CARE_PROVIDER_SITE_OTHER): Payer: Medicare Other | Admitting: Surgery

## 2021-09-16 ENCOUNTER — Encounter: Payer: Self-pay | Admitting: Surgery

## 2021-09-16 VITALS — BP 110/74 | HR 79 | Temp 98.8°F | Ht 61.0 in | Wt 197.0 lb

## 2021-09-16 DIAGNOSIS — E890 Postprocedural hypothyroidism: Secondary | ICD-10-CM

## 2021-09-16 NOTE — Progress Notes (Signed)
Outpatient Surgical Follow Up ? ?09/16/2021 ? ?Janet Murray is an 51 y.o. female.  ? ?Chief Complaint  ?Patient presents with  ? Follow-up  ? ? 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 Received RAI, seen by endocrinologist. ? No surgical complications ?She continues to smopke ? ?Past Medical History:  ?Diagnosis Date  ? Abdominal mass, RUQ (right upper quadrant)   ? Anxiety   ? Arthritis   ? Asthma   ? Bronchitis   ? Carpal tunnel syndrome of left wrist 10/25/2014  ? Chronic bronchitis, obstructive ([place]) 07/20/2017  ? Chronic pain   ? COPD (chronic obstructive pulmonary disease) ([place])   ? Coronary artery disease   ? Cyst of right kidney 03/22/2018  ? DDD (degenerative disc disease), lumbar 10/25/2014  ? Degenerative joint disease (DJD) of hip   ? Diarrhea of infectious origin 03/25/2018  ? Dysphagia   ? Dyspnea   ? Emphysema of lung ([place])   ? Facet syndrome, lumbar 10/25/2014  ? Fatty liver 03/25/2018  ? Foot fracture, left 05/02/2015  ? 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 GERD (gastroesophageal reflux disease)   ? Hereditary and idiopathic peripheral neuropathy 11/06/2014  ? Hypothyroidism   ? IBS (irritable bowel syndrome)   ? Low back pain   ? Migraine 02/04/2016  ? Occipital neuralgia   ? Osteoarthritis of spine with radiculopathy, lumbar region 10/25/2014  ? Other intervertebral disc degeneration, lumbar region   ? Other irritable bowel syndrome 04/20/2017  ? Other spondylosis with radiculopathy, lumbar region 10/25/2014  ? RLS (restless legs syndrome)   ? Sacroiliac joint dysfunction of both sides 10/25/2014  ? Spondylosis of lumbar spine   ? Urinary incontinence   ? ? ?Past Surgical History:  ?Procedure Laterality Date  ? APPENDECTOMY    ? BLADDER SURGERY    ? sling - Dr. Davis Gourd  ? CESAREAN SECTION    ? COLONOSCOPY WITH PROPOFOL N/A 04/11/2020  ? Procedure: COLONOSCOPY WITH PROPOFOL;  Surgeon: Virgel Manifold, MD;   Location: [facility] ENDOSCOPY;  Service: Endoscopy;  Laterality: N/A;  ? ESOPHAGOGASTRODUODENOSCOPY  04/26/2019  ? STOMACH SURGERY    ? THYROIDECTOMY N/A 04/12/2021  ? Procedure: THYROIDECTOMY, total;  Surgeon: Jules Husbands, MD;  Location: [facility] ORS;  Service: General;  Laterality: N/A;  ? TOTAL ABDOMINAL HYSTERECTOMY W/ BILATERAL SALPINGOOPHORECTOMY    ? ? ?Family History  ?Problem Relation Age of Onset  ? Neuropathy Mother   ? Arthritis Father   ? Hypertension Father   ? Leukemia Father   ? Heart disease Father   ?     CAD s/p CABG  ? Healthy Brother   ? Healthy Daughter   ? Healthy Son   ? Dementia Maternal Grandmother   ? Hypertension Maternal Grandmother   ? Hypercholesterolemia Maternal Grandmother   ? Heart disease Maternal Grandmother   ? Healthy Brother   ? Healthy Brother   ? Stroke Neg Hx   ? Cancer Neg Hx   ? ? ?Social History:  reports that she has been smoking cigarettes. She has a 24.00 pack-year smoking history. She has never used smokeless tobacco. She reports that she does not currently use alcohol. She reports that she does not use drugs. ? ?Allergies:  ?Allergies  ?Allergen Reactions  ? Penicillins Hives and Swelling  ?  Has patient had a PCN reaction causing immediate rash, facial/tongue/throat swelling, SOB or lightheadedness with hypotension: Yes ?Has patient had  a PCN reaction causing severe rash involving mucus membranes or skin necrosis: No ?Has patient had a PCN reaction that required hospitalization No ?Has patient had a PCN reaction occurring within the last 10 years: No ?If all of the above answers are "NO", then may proceed with Cephalosporin use.  ? Acetaminophen   ?  Due to polyps   ? Ciprofloxacin Itching  ? Aspirin Palpitations  ?  Heart palpitations  ? Hydrocodone Hives and Rash  ? Other Rash  ?  bandaids  ? Tape Rash  ?  bandaids ?bandaids  ? ? ?Medications reviewed. ? ? ? ?ROS ?Full ROS performed and is otherwise negative other than what is stated in HPI ? ? ?BP 110/74   Pulse 79    Temp 98.8 ?F (37.1 ?C)   Ht '5\' 1"'$  (1.549 m)   Wt 197 lb (89.4 kg)   SpO2 96%   BMI 37.22 kg/m?  ? ?Physical Exam ?Vitals and nursing note reviewed. Exam conducted with a chaperone present.  ?Constitutional:   ?   General: She is not in acute distress. ?   Appearance: Normal appearance.  ?Neck:  ?   Vascular: No carotid bruit.  ?   Comments: Thyroidectomy scar, no infection. No LAD ?Cardiovascular:  ?   Rate and Rhythm: Normal rate and regular rhythm.  ?Pulmonary:  ?   Effort: Pulmonary effort is normal. No respiratory distress.  ?   Breath sounds: Normal breath sounds. No stridor. No wheezing, rhonchi or rales.  ?Abdominal:  ?   General: Abdomen is flat.  ?   Palpations: Abdomen is soft.  ?Musculoskeletal:     ?   General: No swelling or tenderness. Normal range of motion.  ?   Cervical back: Normal range of motion and neck supple. No rigidity or tenderness.  ?Lymphadenopathy:  ?   Cervical: No cervical adenopathy.  ?Skin: ?   General: Skin is warm and dry.  ?   Capillary Refill: Capillary refill takes less than 2 seconds.  ?Neurological:  ?   General: No focal deficit present.  ?   Mental Status: She is alert and oriented to person, place, and time.  ?Psychiatric:     ?   Mood and Affect: Mood normal.     ?   Behavior: Behavior normal.     ?   Thought Content: Thought content normal.     ?   Judgment: Judgment normal.  ? ? ? ?Assessment/Plan: ? ?1. History of thyroidectomy for follicular CA s/p RAI, doing well w/o surgical issues. May f/u w endocrinologist.  ?Greater than 50% of the 20 minutes  visit was spent in counseling/coordination of care ? ? ?Caroleen Hamman, MD FACS ?General Surgeon  ?

## 2021-09-16 NOTE — Patient Instructions (Signed)
If you have any concerns or questions, please feel free to call our office. Follow up as needed.  ? ? ?Thyroidectomy, Care After ? ?This sheet gives you information about how to care for yourself after your procedure. Your health care provider may also give you more specific instructions. If you have problems or questions, contact your health care provider. ?What can I expect after the procedure? ?After the procedure, it is common to have: ?Mild pain in the neck or upper body, especially when swallowing. ?A swollen neck. ?A sore throat. ?A weak or hoarse voice. ?Slight tingling or numbness around your mouth, or in your fingers or toes. This may last for a day or two after surgery. This condition is caused by low levels of calcium. You may be given calcium supplements to treat it. ?Follow these instructions at home: ?Medicines ?Take over-the-counter and prescription medicines only as told by your health care provider. ?Do not drive or use heavy machinery while taking prescription pain medicine. ?Do not take medicines that contain aspirin and ibuprofen until your health care provider says that you can. These medicines can increase your risk of bleeding. ?Take a thyroid hormone medicine as recommended by your health care provider. You will have to take this medicine for the rest of your life if your entire thyroid was removed. ?Eating and drinking ?Start slowly with eating. You may need to have only liquids and soft foods for a few days or as directed by your health care provider. ?To prevent or treat constipation while you are taking prescription pain medicine, your health care provider may recommend that you: ?Drink enough fluid to keep your urine pale yellow. ?Take over-the-counter or prescription medicines. ?Eat foods that are high in fiber, such as fresh fruits and vegetables, whole grains, and beans. ?Limit foods that are high in fat and processed sugars, such as fried and sweet foods. ?Incision care ?Follow  instructions from your health care provider about how to take care of your incision. Make sure you: ?Wash your hands with soap and water before you change your bandage (dressing). If soap and water are not available, use hand sanitizer. ?Change your dressing as told by your health care provider. ?Leave stitches (sutures), skin glue, or adhesive strips in place. These skin closures may need to stay in place for 2 weeks or longer. If adhesive strip edges start to loosen and curl up, you may trim the loose edges. Do not remove adhesive strips completely unless your health care provider tells you to do that. ?Check your incision area every day for signs of infection. Check for: ?Redness, swelling, or pain. ?Fluid or blood. ?Warmth. ?Pus or a bad smell. ?Do not take baths, swim, or use a hot tub until your health care provider approves. ?Activity ?For the first 10 days after the procedure or as instructed by your health care provider: ?Do not lift anything that is heavier than 10 lb (4.5 kg). ?Do not jog, swim, or do other strenuous exercises. ?Do not play contact sports. ?Avoid sitting for a long time without moving. Get up to take short walks every 1-2 hours. This is needed to improve blood flow and breathing. Ask for help if you feel weak or unsteady. ?Return to your normal activities as told by your health care provider. Ask your health care provider what activities are safe for you. ?General instructions ?Do not use any products that contain nicotine or tobacco, such as cigarettes and e-cigarettes. These can delay healing after surgery. If you  need help quitting, ask your health care provider. ?Keep all follow-up visits as told by your health care provider. This is important. Your health care provider needs to monitor the calcium level in your blood to make sure that it does not become low. ?Contact a health care provider if you: ?Have a fever. ?Have more redness, swelling, or pain around your incision area. ?Have  fluid or blood coming from your incision area. ?Notice that your incision area feels warm to the touch. ?Have pus or a bad smell coming from your incision area. ?Have trouble talking. ?Have nausea or vomiting for more than 2 days. ?Get help right away if you: ?Have trouble breathing. ?Have trouble swallowing. ?Develop a rash. ?Develop a cough that gets worse. ?Notice that your speech changes, or you have hoarseness that gets worse. ?Develop numbness, tingling, or muscle spasms in the arms, hands, feet, or face. ?Summary ?After the procedure, it is common to feel mild pain in the neck or upper body, especially when swallowing. ?Take medicines as told by your health care provider. These include pain medicines and thyroid hormones, if required. ?Follow instructions from your health care provider about how to take care of your incision. Watch for signs of infection. ?Keep all follow-up visits as told by your health care provider. This is important. Your health care provider needs to monitor the calcium level in your blood to make sure that it does not become low. ?Get help right away if you develop difficulty breathing, or numbness, tingling, or muscle spasms in the arms, hands, feet, or face. ?This information is not intended to replace advice given to you by your health care provider. Make sure you discuss any questions you have with your health care provider. ?Document Revised: 02/02/2020 Document Reviewed: 02/02/2020 ?Elsevier Patient Education ? [place]. ? ?

## 2021-09-18 DIAGNOSIS — M5481 Occipital neuralgia: Secondary | ICD-10-CM | POA: Diagnosis not present

## 2021-09-18 DIAGNOSIS — M545 Low back pain, unspecified: Secondary | ICD-10-CM | POA: Diagnosis not present

## 2021-09-18 DIAGNOSIS — G894 Chronic pain syndrome: Secondary | ICD-10-CM | POA: Diagnosis not present

## 2021-09-18 DIAGNOSIS — G8929 Other chronic pain: Secondary | ICD-10-CM | POA: Diagnosis not present

## 2021-09-19 ENCOUNTER — Inpatient Hospital Stay: Admission: RE | Admit: 2021-09-19 | Payer: Medicare Other | Source: Ambulatory Visit

## 2021-09-19 ENCOUNTER — Other Ambulatory Visit: Payer: Medicare Other

## 2021-09-25 DIAGNOSIS — N281 Cyst of kidney, acquired: Secondary | ICD-10-CM | POA: Diagnosis not present

## 2021-09-25 DIAGNOSIS — J449 Chronic obstructive pulmonary disease, unspecified: Secondary | ICD-10-CM | POA: Diagnosis not present

## 2021-09-25 DIAGNOSIS — K2100 Gastro-esophageal reflux disease with esophagitis, without bleeding: Secondary | ICD-10-CM | POA: Diagnosis not present

## 2021-09-25 DIAGNOSIS — E039 Hypothyroidism, unspecified: Secondary | ICD-10-CM | POA: Diagnosis not present

## 2021-09-25 DIAGNOSIS — K589 Irritable bowel syndrome without diarrhea: Secondary | ICD-10-CM | POA: Diagnosis not present

## 2021-09-25 DIAGNOSIS — I2510 Atherosclerotic heart disease of native coronary artery without angina pectoris: Secondary | ICD-10-CM | POA: Diagnosis not present

## 2021-09-25 DIAGNOSIS — M159 Polyosteoarthritis, unspecified: Secondary | ICD-10-CM | POA: Diagnosis not present

## 2021-09-25 DIAGNOSIS — Z9009 Acquired absence of other part of head and neck: Secondary | ICD-10-CM | POA: Diagnosis not present

## 2021-09-25 DIAGNOSIS — E119 Type 2 diabetes mellitus without complications: Secondary | ICD-10-CM | POA: Diagnosis not present

## 2021-09-27 ENCOUNTER — Ambulatory Visit (INDEPENDENT_AMBULATORY_CARE_PROVIDER_SITE_OTHER): Payer: Medicare Other | Admitting: Nurse Practitioner

## 2021-09-27 ENCOUNTER — Encounter: Payer: Self-pay | Admitting: Nurse Practitioner

## 2021-09-27 VITALS — BP 114/72 | HR 75 | Temp 98.2°F | Wt 194.6 lb

## 2021-09-27 DIAGNOSIS — I700 Atherosclerosis of aorta: Secondary | ICD-10-CM | POA: Diagnosis not present

## 2021-09-27 DIAGNOSIS — D696 Thrombocytopenia, unspecified: Secondary | ICD-10-CM | POA: Diagnosis not present

## 2021-09-27 DIAGNOSIS — Z1159 Encounter for screening for other viral diseases: Secondary | ICD-10-CM

## 2021-09-27 DIAGNOSIS — E039 Hypothyroidism, unspecified: Secondary | ICD-10-CM | POA: Diagnosis not present

## 2021-09-27 DIAGNOSIS — J439 Emphysema, unspecified: Secondary | ICD-10-CM

## 2021-09-27 LAB — MICROALBUMIN, URINE WAIVED
Creatinine, Urine Waived: 50 mg/dL (ref 10–300)
Microalb, Ur Waived: 10 mg/L (ref 0–19)
Microalb/Creat Ratio: <30 mg/g (ref <30)

## 2021-09-27 NOTE — Assessment & Plan Note (Signed)
Chronic. Found on Imaging on May 2022.  Continue with Atorvastatin '80mg'$  daily.  Will continue to assess at future visits.  ?

## 2021-09-27 NOTE — Assessment & Plan Note (Signed)
Chronic.  Controlled.  Has completed radiation treatment.  Has completed follow up with General Surgery.  Continue with current medication regimen.  Labs ordered today.  Will make recommendations based on lab results.  Return to clinic in 3 months for reevaluation.  Call sooner if concerns arise.   ? ?

## 2021-09-27 NOTE — Patient Instructions (Signed)
[facility]- call for appt for colonoscopy ?

## 2021-09-27 NOTE — Assessment & Plan Note (Signed)
Chronic.  Controlled.  Continue with current medication regimen.  Continue collaboration with Pulmonology.  Labs ordered today.  Return to clinic in 3 months for reevaluation.  Call sooner if concerns arise.  ? ?

## 2021-09-27 NOTE — Progress Notes (Signed)
? ?BP 114/72   Pulse 75   Temp 98.2 ?F (36.8 ?C) (Oral)   Wt 194 lb 9.6 oz (88.3 kg)   SpO2 95%   BMI 36.77 kg/m?   ? ?Subjective:  ? ? Patient ID: Janet Murray, female    DOB: May 17, 1971, 50 y.o.   MRN: [id number] ? ?HPI: ?Janet Murray is a 51 y.o. female ? ?Chief Complaint  ?Patient presents with  ? Hypothyroidism  ? ?COPD ?COPD status: controlled ?Satisfied with current treatment?: yes ?Oxygen use: no ?Dyspnea frequency:  ?Cough frequency:  ?Rescue inhaler frequency:   ?Limitation of activity: no ?Productive cough:  ?Last Spirometry:  ?Pneumovax: Not up to Date ?Influenza: Not up to Date ? ? ?HYPOTHYROIDISM ?Patient states she completed her radiation treatment for thyroid cancer.  She is cancer free.  ?  ? Denies HA, CP, SOB, dizziness, palpitations, visual changes, and lower extremity swelling. ? ?Relevant past medical, surgical, family and social history reviewed and updated as indicated. Interim medical history since our last visit reviewed. ?Allergies and medications reviewed and updated. ? ?Review of Systems  ?Constitutional:  Negative for fever and unexpected weight change.  ?Eyes:  Negative for visual disturbance.  ?Respiratory:  Negative for cough, chest tightness and shortness of breath.   ?Cardiovascular:  Negative for chest pain, palpitations and leg swelling.  ?Endocrine: Negative for cold intolerance.  ?Neurological:  Negative for dizziness and headaches.  ? ?Per HPI unless specifically indicated above ? ?   ?Objective:  ?  ?BP 114/72   Pulse 75   Temp 98.2 ?F (36.8 ?C) (Oral)   Wt 194 lb 9.6 oz (88.3 kg)   SpO2 95%   BMI 36.77 kg/m?   ?Wt Readings from Last 3 Encounters:  ?09/27/21 194 lb 9.6 oz (88.3 kg)  ?09/16/21 197 lb (89.4 kg)  ?08/14/21 190 lb 9.6 oz (86.5 kg)  ?  ?Physical Exam ?Vitals and nursing note reviewed.  ?Constitutional:   ?   General: She is not in acute distress. ?   Appearance: Normal appearance. She is obese. She is not ill-appearing, toxic-appearing or diaphoretic.   ?HENT:  ?   Head: Normocephalic.  ?   Right Ear: External ear normal.  ?   Left Ear: External ear normal.  ?   Nose: Nose normal.  ?   Mouth/Throat:  ?   Mouth: Mucous membranes are moist.  ?   Pharynx: Oropharynx is clear.  ?Eyes:  ?   General:     ?   Right eye: No discharge.     ?   Left eye: No discharge.  ?   Extraocular Movements: Extraocular movements intact.  ?   Conjunctiva/sclera: Conjunctivae normal.  ?   Pupils: Pupils are equal, round, and reactive to light.  ?Cardiovascular:  ?   Rate and Rhythm: Normal rate and regular rhythm.  ?   Heart sounds: No murmur heard. ?Pulmonary:  ?   Effort: Pulmonary effort is normal. No respiratory distress.  ?   Breath sounds: Normal breath sounds. No wheezing or rales.  ?Musculoskeletal:  ?   Cervical back: Normal range of motion and neck supple.  ?Skin: ?   General: Skin is warm and dry.  ?   Capillary Refill: Capillary refill takes less than 2 seconds.  ?Neurological:  ?   General: No focal deficit present.  ?   Mental Status: She is alert and oriented to person, place, and time. Mental status is at baseline.  ?Psychiatric:     ?  Mood and Affect: Mood normal.     ?   Behavior: Behavior normal.     ?   Thought Content: Thought content normal.     ?   Judgment: Judgment normal.  ? ? ?Results for orders placed or performed in visit on 07/31/21  ?PTH, intact and calcium  ?Result Value Ref Range  ? PTH 30 16 - 77 pg/mL  ? Calcium 9.6 8.6 - 10.4 mg/dL  ?VITAMIN D 25 Hydroxy (Vit-D Deficiency, Fractures)  ?Result Value Ref Range  ? VITD 7.83 (L) 30.00 - 100.00 ng/mL  ? ?   ?Assessment & Plan:  ? ?Problem List Items Addressed This Visit   ? ?  ? Cardiovascular and Mediastinum  ? Atherosclerosis of aorta ([place])  ?  Chronic. Found on Imaging on May 2022.  Continue with Atorvastatin 28m daily.  Will continue to assess at future visits.  ? ?  ?  ? Relevant Orders  ? Microalbumin, Urine Waived  ?  ? Respiratory  ? COPD (chronic obstructive pulmonary disease) with emphysema  ([place]  ?  Chronic.  Controlled.  Continue with current medication regimen.  Continue collaboration with Pulmonology.  Labs ordered today.  Return to clinic in 3 months for reevaluation.  Call sooner if concerns arise.  ? ? ?  ?  ? Relevant Orders  ? Comp Met (CMET)  ? CBC w/Diff  ? Microalbumin, Urine Waived  ?  ? Endocrine  ? Acquired hypothyroidism - Primary  ?  Chronic.  Controlled.  Has completed radiation treatment.  Has completed follow up with General Surgery.  Continue with current medication regimen.  Labs ordered today.  Will make recommendations based on lab results.  Return to clinic in 3 months for reevaluation.  Call sooner if concerns arise.   ? ? ?  ?  ? Relevant Orders  ? TSH  ? T4, free  ?  ? Hematopoietic and Hemostatic  ? Thrombocytopenia ([place]  ?  Chronic. Labs ordered today. Will make recommendations based on lab results.  ? ?  ?  ? Relevant Orders  ? CBC w/Diff  ? Microalbumin, Urine Waived  ? ?Other Visit Diagnoses   ? ? Encounter for hepatitis C screening test for low risk patient      ? Relevant Orders  ? Hepatitis C Antibody  ? ?  ?  ? ?Follow up plan: ?Return in about 3 months (around 12/27/2021) for HTN, HLD, DM2 FU. ? ? ? ? ? ? ?

## 2021-09-27 NOTE — Assessment & Plan Note (Signed)
Chronic.  Labs ordered today. Will make recommendations based on lab results.  

## 2021-09-28 LAB — TSH: TSH: 6.96 u[IU]/mL — ABNORMAL HIGH (ref 0.450–4.500)

## 2021-09-28 LAB — COMPREHENSIVE METABOLIC PANEL
ALT: 5 IU/L (ref 0–32)
AST: 13 IU/L (ref 0–40)
Albumin/Globulin Ratio: 2.6 — ABNORMAL HIGH (ref 1.2–2.2)
Albumin: 4.4 g/dL (ref 3.8–4.9)
Alkaline Phosphatase: 88 IU/L (ref 44–121)
BUN/Creatinine Ratio: 7 — ABNORMAL LOW (ref 9–23)
BUN: 4 mg/dL — ABNORMAL LOW (ref 6–24)
Bilirubin Total: 0.4 mg/dL (ref 0.0–1.2)
CO2: 23 mmol/L (ref 20–29)
Calcium: 9.2 mg/dL (ref 8.7–10.2)
Chloride: 103 mmol/L (ref 96–106)
Creatinine, Ser: 0.55 mg/dL — ABNORMAL LOW (ref 0.57–1.00)
Globulin, Total: 1.7 g/dL (ref 1.5–4.5)
Glucose: 88 mg/dL (ref 70–99)
Potassium: 3.8 mmol/L (ref 3.5–5.2)
Sodium: 142 mmol/L (ref 134–144)
Total Protein: 6.1 g/dL (ref 6.0–8.5)
eGFR: 111 mL/min/{1.73_m2} (ref >59)

## 2021-09-28 LAB — CBC WITH DIFFERENTIAL/PLATELET
Basophils Absolute: 0.1 10*3/uL (ref 0.0–0.2)
Basos: 1 %
EOS (ABSOLUTE): 0.1 10*3/uL (ref 0.0–0.4)
Eos: 2 %
Hematocrit: 39.8 % (ref 34.0–46.6)
Hemoglobin: 13.5 g/dL (ref 11.1–15.9)
Immature Grans (Abs): 0 10*3/uL (ref 0.0–0.1)
Immature Granulocytes: 0 %
Lymphocytes Absolute: 1.9 10*3/uL (ref 0.7–3.1)
Lymphs: 30 %
MCH: 33.1 pg — ABNORMAL HIGH (ref 26.6–33.0)
MCHC: 33.9 g/dL (ref 31.5–35.7)
MCV: 98 fL — ABNORMAL HIGH (ref 79–97)
Monocytes Absolute: 0.5 10*3/uL (ref 0.1–0.9)
Monocytes: 8 %
Neutrophils Absolute: 3.7 10*3/uL (ref 1.4–7.0)
Neutrophils: 59 %
RBC: 4.08 x10E6/uL (ref 3.77–5.28)
RDW: 11.8 % (ref 11.7–15.4)
WBC: 6.2 10*3/uL (ref 3.4–10.8)

## 2021-09-28 LAB — T4, FREE: Free T4: 1.22 ng/dL (ref 0.82–1.77)

## 2021-09-28 LAB — HEPATITIS C ANTIBODY: Hep C Virus Ab: NONREACTIVE

## 2021-09-30 MED ORDER — LEVOTHYROXINE SODIUM 175 MCG PO TABS: 175.0000 ug | ORAL_TABLET | Freq: Every day | ORAL | 0 refills | Status: DC

## 2021-09-30 NOTE — Addendum Note (Signed)
Addended by: Jon Billings on: 09/30/2021 12:49 PM ? ? Modules accepted: Orders ? ?

## 2021-09-30 NOTE — Progress Notes (Signed)
Please let patient know that her lab work shows that her TSH is elevated. I would like to increase her dose of Levothyroxine 185mg.  I have already sent it to the pharmacy.  We will recheck this in 6 weeks with lab work.  Please make her a lab appt.

## 2021-10-01 MED ORDER — VICTOZA 18 MG/3ML ~~LOC~~ SOPN: 0.6000 mg | PEN_INJECTOR | Freq: Every day | SUBCUTANEOUS | 1 refills | Status: DC

## 2021-10-01 NOTE — Progress Notes (Signed)
Please let patient know that I sent her Victoza to the pharmacy.  She will start at 0.'6mg'$  daily until our next visit.

## 2021-10-01 NOTE — Addendum Note (Signed)
Addended by: Jon Billings on: 10/01/2021 12:51 PM ? ? Modules accepted: Orders ? ?

## 2021-10-03 DIAGNOSIS — G894 Chronic pain syndrome: Secondary | ICD-10-CM | POA: Diagnosis not present

## 2021-10-04 ENCOUNTER — Telehealth: Payer: Self-pay | Admitting: Acute Care

## 2021-10-04 ENCOUNTER — Telehealth: Payer: Self-pay

## 2021-10-04 ENCOUNTER — Other Ambulatory Visit: Payer: Self-pay

## 2021-10-04 DIAGNOSIS — Z86010 Personal history of colonic polyps: Secondary | ICD-10-CM

## 2021-10-04 MED ORDER — NA SULFATE-K SULFATE-MG SULF 17.5-3.13-1.6 GM/177ML PO SOLN: 2.0000 | Freq: Every day | ORAL | 0 refills | Status: AC

## 2021-10-04 NOTE — Telephone Encounter (Signed)
Gastroenterology Pre-Procedure Review ? ?Request Date: 10/22/21 ?Requesting Physician: Dr. Marius Ditch ? ?PATIENT REVIEW QUESTIONS: The patient responded to the following health history questions as indicated:   ? ?1. Are you having any GI issues? no ?2. 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 yes (04/11/20 performed by Dr. Bonna Gains 47m polyp noted suggested 2 day prep) ?3. Do you have a family history of Colon Cancer or Polyps? no ?4. Diabetes Mellitus? no ?5. Joint replacements in the past 12 months?no ?6. Major health problems in the past 3 months?yes (Thyroidectomy November 2022 due to thyroid cancer) ?7. Any artificial heart valves, MVP, or defibrillator?no ?   ?MEDICATIONS & ALLERGIES:    ?Patient reports the following regarding taking any anticoagulation/antiplatelet therapy:   ?Plavix, Coumadin, Eliquis, Xarelto, Lovenox, Pradaxa, Brilinta, or Effient? no ?Aspirin? no ? ?Patient confirms/reports the following medications:  ?Current Outpatient Medications  ?Medication Sig Dispense Refill  ? albuterol (PROVENTIL) (2.5 MG/3ML) 0.083% nebulizer solution Take 3 mLs (2.5 mg total) by nebulization every 6 (six) hours as needed for wheezing or shortness of breath. 75 mL 12  ? AMBULATORY NON FORMULARY MEDICATION Medication Name: nebulizer ?DX: J44.9 1 each 0  ? atorvastatin (LIPITOR) 80 MG tablet Take 1 tablet (80 mg total) by mouth daily. 90 tablet 3  ? diclofenac Sodium (VOLTAREN) 1 % GEL Apply 1 application topically daily as needed (Knee pain).    ? ergocalciferol (VITAMIN D2) 1.25 MG (50000 UT) capsule Take by mouth.    ? esomeprazole (NEXIUM) 40 MG capsule TAKE ONE CAPSULE BY MOUTH TWICE A DAY 180 capsule 0  ? Glycopyrrolate-Formoterol (BEVESPI AEROSPHERE) 9-4.8 MCG/ACT AERO INHALE 2 PUFFS BY MOUTH INTO THE LUNGS 2TIMES DAILY 10.7 g 11  ? hydrOXYzine (ATARAX/VISTARIL) 25 MG tablet Take 1 tablet (25 mg total) by mouth 3 (three) times daily as needed. (Patient taking differently: Take 25 mg by mouth 3 (three) times  daily as needed for anxiety.) 30 tablet 5  ? levothyroxine (SYNTHROID) 175 MCG tablet Take 1 tablet (175 mcg total) by mouth daily. 90 tablet 0  ? liraglutide (VICTOZA) 18 MG/3ML SOPN Inject 0.6 mg into the skin daily. 9 mL 1  ? montelukast (SINGULAIR) 10 MG tablet TAKE ONE TABLET BY MOUTH AT BEDTIME 90 tablet 1  ? naloxone (NARCAN) nasal spray 4 mg/0.1 mL SMARTSIG:Both Nares    ? ondansetron (ZOFRAN-ODT) 4 MG disintegrating tablet TAKE 1 TABLET BY MOUTH EVERY 8 HOURS AS NEEDED FOR UP TO 5 DAYS 30 tablet 0  ? oxyCODONE (OXY IR/ROXICODONE) 5 MG immediate release tablet Take 1 tablet (5 mg total) by mouth every 4 (four) hours as needed for severe pain. 20 tablet 0  ? pramipexole (MIRAPEX) 0.5 MG tablet TAKE ONE TABLET BY MOUTH THREE TIMES A DAY 360 tablet 1  ? tiZANidine (ZANAFLEX) 2 MG tablet Take 2 mg by mouth 2 (two) times daily.    ? VENTOLIN HFA 108 (90 Base) MCG/ACT inhaler INHALE 2 PUFFS BY MOUTH INTO THE LUNGS EVERY 6 HOURS AS NEEDED FOR WHEEZING OR SHORTNESS OF BREATH (Patient taking differently: Inhale 2 puffs into the lungs every 4 (four) hours as needed (COPD).) 18 g 2  ? ?No current facility-administered medications for this visit.  ? ? ?Patient confirms/reports the following allergies:  ?Allergies  ?Allergen Reactions  ? Penicillins Hives and Swelling  ?  Has patient had a PCN reaction causing immediate rash, facial/tongue/throat swelling, SOB or lightheadedness with hypotension: Yes ?Has patient had a PCN reaction causing severe rash involving mucus membranes  or skin necrosis: No ?Has patient had a PCN reaction that required hospitalization No ?Has patient had a PCN reaction occurring within the last 10 years: No ?If all of the above answers are "NO", then may proceed with Cephalosporin use.  ? Acetaminophen   ?  Due to polyps   ? Ciprofloxacin Itching  ? Aspirin Palpitations  ?  Heart palpitations  ? Hydrocodone Hives and Rash  ? Other Rash  ?  bandaids  ? Tape Rash  ?  bandaids ?bandaids  ? ? ?No  orders of the defined types were placed in this encounter. ? ? ?AUTHORIZATION INFORMATION ?Primary Insurance: ?1D#: ?Group #: ? ?Secondary Insurance: ?1D#: ?Group #: ? ?SCHEDULE INFORMATION: ?Date: 10/22/21 ?Time: ?Location: [facility] ?

## 2021-10-04 NOTE — Progress Notes (Signed)
Colonoscopy 10/22/21 with Dr. Marius Ditch at [facility]. ?

## 2021-10-04 NOTE — Telephone Encounter (Signed)
Spoke with patient today regarding [facility] referral for LDCT (by Dr. Patsey Berthold).  Patient states he had thyroid cancer and treatment, just completing radiation therapy about 1 month ago.  Due to recent history of cancer, she is not eligible for a lung cancer screening CT Chest.  She could receive a Chest CT wo contrast, if recommended by her PCP or Dr. Patsey Berthold.  Patient states she doesn't see Dr. Darnell Level very often and would like to contact her PCP for possible CT chest.  Advised we would push her referral out to first of next year.  Patient states she will call PCP office, as she is interested in having the Chest CT now if possible. Will include PCP on this phone note for update.   ?

## 2021-10-06 DIAGNOSIS — G894 Chronic pain syndrome: Secondary | ICD-10-CM | POA: Diagnosis not present

## 2021-10-07 NOTE — Telephone Encounter (Signed)
Called and LVM asking for patient to please return my call.  ? ?OK for [facility] to relay message to the patient if she calls back.  ?

## 2021-10-07 NOTE — Telephone Encounter (Signed)
Please let patient know that she recently had a PET scan to look for other cancer and it was negative.  I would like to hold off on the regular CT and do the low dose CT next year.  ?

## 2021-10-08 NOTE — Telephone Encounter (Signed)
Patient called back and was notified of Karen's message.  ? ?Also routing to pulmonology for them to be aware of Karen's response.  ?

## 2021-10-08 NOTE — Telephone Encounter (Signed)
Called and LVM asking for patient to please return my call.  

## 2021-10-08 NOTE — Telephone Encounter (Signed)
Referral notes updated to reflect this recommendation. ?

## 2021-10-10 ENCOUNTER — Ambulatory Visit
Admission: RE | Admit: 2021-10-10 | Discharge: 2021-10-10 | Disposition: A | Discharge status: Home / Self Care | Payer: Medicare Other | Source: Ambulatory Visit | Attending: Nurse Practitioner | Admitting: Nurse Practitioner

## 2021-10-10 DIAGNOSIS — R928 Other abnormal and inconclusive findings on diagnostic imaging of breast: Secondary | ICD-10-CM

## 2021-10-10 DIAGNOSIS — N6489 Other specified disorders of breast: Secondary | ICD-10-CM

## 2021-10-10 DIAGNOSIS — N6322 Unspecified lump in the left breast, upper inner quadrant: Secondary | ICD-10-CM | POA: Diagnosis not present

## 2021-10-11 NOTE — Progress Notes (Signed)
Please let patient know that there was a mass observed in the left breast. It is likely not cancerous.  It is recommended that she have a repeat ultrasound in 6 months. Please make sure she has this scheduled.

## 2021-10-14 ENCOUNTER — Telehealth: Payer: Self-pay

## 2021-10-14 NOTE — Progress Notes (Signed)
Please let patient know that we already spoke with her and recommended that she wait till next year for the CT lung screening due to recent PET scan being negative. ? ?I was under the impression her pain management doctor was referring her to neurosurgery so I did not place the referral.

## 2021-10-14 NOTE — Telephone Encounter (Signed)
Janet Almas, RN  ?10/14/2021 11:18 AM EDT   ?  ?Patient called.   ?Shared provider's note with pt.  - no questions at this time. ?  ?Pt is requesting follow up on lung scan and Neurosurgeon referral for injections into back.  ? Meredeth Ide, CMA  ?10/11/2021  1:32 PM EDT   ?  ?Called, no answer. Left a VM to call back for results.  ? Georgina Peer, [place]  ?10/11/2021  9:51 AM EDT   ?  ?Called and LVM asking for patient to please return my call.  ?  ?OK for PEc to give result message if patient calls back.   ? Jon Billings, NP  ?10/11/2021  8:04 AM EDT   ?  ?Please let patient know that there was a mass observed in the left breast. It is likely not cancerous.  It is recommended that she have a repeat ultrasound in 6 months. Please make sure she has this scheduled.   ? ? ?Please return pt's call regarding neurosurgeon and lung scan. ?

## 2021-10-14 NOTE — Progress Notes (Signed)
Please let patient know that her pain management doctor needs to place the referral.  I do not treat her back pain.   ? ?If she has further questions regarding the recommendation for the CT we can discuss it during a visit.  She has had a PET scan which is more detailed than the low dose lung CT.  Nothing was found which is why can can wait till next year.  ? ?

## 2021-10-22 ENCOUNTER — Ambulatory Visit: Payer: Medicare Other | Admitting: Anesthesiology

## 2021-10-22 ENCOUNTER — Encounter: Payer: Self-pay | Admitting: Gastroenterology

## 2021-10-22 ENCOUNTER — Ambulatory Visit
Admission: RE | Admit: 2021-10-22 | Discharge: 2021-10-22 | Disposition: A | Discharge status: Home / Self Care | Payer: Medicare Other | Attending: Gastroenterology | Admitting: Gastroenterology

## 2021-10-22 ENCOUNTER — Encounter
Admission: RE | Disposition: A | Discharge status: Home / Self Care | Payer: Self-pay | Source: Home / Self Care | Attending: Gastroenterology

## 2021-10-22 DIAGNOSIS — J449 Chronic obstructive pulmonary disease, unspecified: Secondary | ICD-10-CM | POA: Diagnosis not present

## 2021-10-22 DIAGNOSIS — G629 Polyneuropathy, unspecified: Secondary | ICD-10-CM | POA: Insufficient documentation

## 2021-10-22 DIAGNOSIS — E039 Hypothyroidism, unspecified: Secondary | ICD-10-CM | POA: Diagnosis not present

## 2021-10-22 DIAGNOSIS — K649 Unspecified hemorrhoids: Secondary | ICD-10-CM | POA: Diagnosis not present

## 2021-10-22 DIAGNOSIS — K760 Fatty (change of) liver, not elsewhere classified: Secondary | ICD-10-CM | POA: Diagnosis not present

## 2021-10-22 DIAGNOSIS — E669 Obesity, unspecified: Secondary | ICD-10-CM | POA: Diagnosis not present

## 2021-10-22 DIAGNOSIS — Z6833 Body mass index (BMI) 33.0-33.9, adult: Secondary | ICD-10-CM | POA: Insufficient documentation

## 2021-10-22 DIAGNOSIS — F17210 Nicotine dependence, cigarettes, uncomplicated: Secondary | ICD-10-CM | POA: Diagnosis not present

## 2021-10-22 DIAGNOSIS — K644 Residual hemorrhoidal skin tags: Secondary | ICD-10-CM | POA: Diagnosis not present

## 2021-10-22 DIAGNOSIS — Z1211 Encounter for screening for malignant neoplasm of colon: Secondary | ICD-10-CM | POA: Diagnosis not present

## 2021-10-22 DIAGNOSIS — Z860100 Personal history of colon polyps, unspecified: Secondary | ICD-10-CM

## 2021-10-22 DIAGNOSIS — Z8249 Family history of ischemic heart disease and other diseases of the circulatory system: Secondary | ICD-10-CM | POA: Insufficient documentation

## 2021-10-22 DIAGNOSIS — K635 Polyp of colon: Secondary | ICD-10-CM | POA: Diagnosis not present

## 2021-10-22 DIAGNOSIS — K219 Gastro-esophageal reflux disease without esophagitis: Secondary | ICD-10-CM | POA: Insufficient documentation

## 2021-10-22 DIAGNOSIS — G8929 Other chronic pain: Secondary | ICD-10-CM | POA: Diagnosis not present

## 2021-10-22 DIAGNOSIS — I2510 Atherosclerotic heart disease of native coronary artery without angina pectoris: Secondary | ICD-10-CM | POA: Diagnosis not present

## 2021-10-22 DIAGNOSIS — D123 Benign neoplasm of transverse colon: Secondary | ICD-10-CM | POA: Insufficient documentation

## 2021-10-22 HISTORY — PX: COLONOSCOPY WITH PROPOFOL: SHX5780

## 2021-10-22 SURGERY — COLONOSCOPY WITH PROPOFOL
Anesthesia: General

## 2021-10-22 MED ORDER — LIDOCAINE HCL (CARDIAC) PF 100 MG/5ML IV SOSY
PREFILLED_SYRINGE | INTRAVENOUS | Status: DC | PRN
  Administered 2021-10-22: 60 mg via INTRAVENOUS

## 2021-10-22 MED ORDER — SODIUM CHLORIDE 0.9 % IV SOLN: INTRAVENOUS | Status: DC

## 2021-10-22 MED ORDER — GLYCOPYRROLATE 0.2 MG/ML IJ SOLN
INTRAMUSCULAR | Status: DC | PRN
  Administered 2021-10-22: .2 mg via INTRAVENOUS

## 2021-10-22 MED ORDER — PROPOFOL 500 MG/50ML IV EMUL
INTRAVENOUS | Status: DC | PRN
  Administered 2021-10-22: 140 ug/kg/min via INTRAVENOUS

## 2021-10-22 MED ORDER — PROPOFOL 10 MG/ML IV BOLUS
INTRAVENOUS | Status: DC | PRN
  Administered 2021-10-22: 80 mg via INTRAVENOUS

## 2021-10-22 MED ORDER — IPRATROPIUM-ALBUTEROL 0.5-2.5 (3) MG/3ML IN SOLN
RESPIRATORY_TRACT | Status: AC
  Administered 2021-10-22: 3 mL
  Filled 2021-10-22: qty 3

## 2021-10-22 NOTE — Anesthesia Postprocedure Evaluation (Signed)
Anesthesia Post Note ? ?Patient: Janet Murray ? ?Procedure(s) Performed: COLONOSCOPY WITH PROPOFOL ? ?Patient location during evaluation: Endoscopy ?Anesthesia Type: General ?Level of consciousness: awake and alert ?Pain management: pain level controlled ?Vital Signs Assessment: post-procedure vital signs reviewed and stable ?Respiratory status: spontaneous breathing, nonlabored ventilation and respiratory function stable ?Cardiovascular status: blood pressure returned to baseline and stable ?Postop Assessment: no apparent nausea or vomiting ?Anesthetic complications: no ? ? ?No notable events documented. ? ? ?Last Vitals:  ?Vitals:  ? 10/22/21 1136 10/22/21 1137  ?BP: (!) 121/102 121/86  ?Pulse: 76 80  ?Resp: 19 18  ?Temp:    ?SpO2: 99% 100%  ?  ?Last Pain:  ?Vitals:  ? 10/22/21 1137  ?TempSrc:   ?PainSc: 0-No pain  ? ? ?  ?  ?  ?  ?  ?  ? ?[place] Ouch ? ? ? ? ?

## 2021-10-22 NOTE — H&P (Signed)
?Cephas Darby, MD ?[address]  ?[address]  ?[place], [place] [address]  ?Main: [phone]  ?Fax: [phone] ?Pager: [phone] ? ?Primary Care Physician:  Jon Billings, NP ?Primary Gastroenterologist:  Dr. Cephas Darby ? ?Pre-Procedure History & Physical: ?HPI:  Janet Murray is a 51 y.o. female is here for an colonoscopy. ?  ?Past Medical History:  ?Diagnosis Date  ? Abdominal mass, RUQ (right upper quadrant)   ? Anxiety   ? Arthritis   ? Asthma   ? Bronchitis   ? Cancer ([place]) 04/12/2021  ? Carpal tunnel syndrome of left wrist 10/25/2014  ? Chronic bronchitis, obstructive ([place]) 07/20/2017  ? Chronic pain   ? COPD (chronic obstructive pulmonary disease) ([place])   ? Coronary artery disease   ? Cyst of right kidney 03/22/2018  ? DDD (degenerative disc disease), lumbar 10/25/2014  ? Degenerative joint disease (DJD) of hip   ? Diarrhea of infectious origin 03/25/2018  ? Dysphagia   ? Dyspnea   ? Emphysema of lung ([place])   ? Facet syndrome, lumbar 10/25/2014  ? Fatty liver 03/25/2018  ? Foot fracture, left 05/02/2015  ? 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 GERD (gastroesophageal reflux disease)   ? Hereditary and idiopathic peripheral neuropathy 11/06/2014  ? Hypothyroidism   ? IBS (irritable bowel syndrome)   ? Low back pain   ? Migraine 02/04/2016  ? Occipital neuralgia   ? Osteoarthritis of spine with radiculopathy, lumbar region 10/25/2014  ? Other intervertebral disc degeneration, lumbar region   ? Other irritable bowel syndrome 04/20/2017  ? Other spondylosis with radiculopathy, lumbar region 10/25/2014  ? RLS (restless legs syndrome)   ? Sacroiliac joint dysfunction of both sides 10/25/2014  ? Spondylosis of lumbar spine   ? Urinary incontinence   ? ? ?Past Surgical History:  ?Procedure Laterality Date  ? APPENDECTOMY    ? BLADDER SURGERY    ? sling - Dr. Davis Gourd  ? CESAREAN SECTION    ? COLONOSCOPY WITH PROPOFOL N/A 04/11/2020  ? Procedure: COLONOSCOPY WITH PROPOFOL;  Surgeon: Virgel Manifold, MD;  Location: [facility] ENDOSCOPY;  Service: Endoscopy;  Laterality: N/A;  ? ESOPHAGOGASTRODUODENOSCOPY  04/26/2019  ? STOMACH SURGERY    ? THYROIDECTOMY N/A 04/12/2021  ? Procedure: THYROIDECTOMY, total;  Surgeon: Jules Husbands, MD;  Location: [facility] ORS;  Service: General;  Laterality: N/A;  ? TOTAL ABDOMINAL HYSTERECTOMY W/ BILATERAL SALPINGOOPHORECTOMY    ? ? ?Prior to Admission medications   ?Medication Sig Start Date End Date Taking? Authorizing Provider  ?albuterol (PROVENTIL) (2.5 MG/3ML) 0.083% nebulizer solution Take 3 mLs (2.5 mg total) by nebulization every 6 (six) hours as needed for wheezing or shortness of breath. 02/28/21  Yes Parrett, Tammy S, NP  ?atorvastatin (LIPITOR) 80 MG tablet Take 1 tablet (80 mg total) by mouth daily. 02/22/21  Yes Jon Billings, NP  ?diclofenac Sodium (VOLTAREN) 1 % GEL Apply 1 application topically daily as needed (Knee pain). 09/18/20  Yes [provider]  ?ergocalciferol (VITAMIN D2) 1.25 MG (50000 UT) capsule Take by mouth. 09/25/21 12/24/21 Yes [provider]  ?esomeprazole (NEXIUM) 40 MG capsule TAKE ONE CAPSULE BY MOUTH TWICE A DAY 06/19/21  Yes Jon Billings, NP  ?Glycopyrrolate-Formoterol (BEVESPI AEROSPHERE) 9-4.8 MCG/ACT AERO INHALE 2 PUFFS BY MOUTH INTO THE LUNGS 2TIMES DAILY 02/27/21  Yes Tyler Pita, MD  ?hydrOXYzine (ATARAX/VISTARIL) 25 MG tablet Take 1 tablet (25 mg total) by mouth 3 (three) times daily as needed. ?Patient taking differently: Take 25 mg by mouth  3 (three) times daily as needed for anxiety. 10/26/19  Yes Volney American, PA-C  ?levothyroxine (SYNTHROID) 175 MCG tablet Take 1 tablet (175 mcg total) by mouth daily. 09/30/21  Yes Jon Billings, NP  ?montelukast (SINGULAIR) 10 MG tablet TAKE ONE TABLET BY MOUTH AT BEDTIME 06/19/21  Yes Jon Billings, NP  ?ondansetron (ZOFRAN-ODT) 4 MG disintegrating tablet TAKE 1 TABLET BY MOUTH EVERY 8 HOURS AS NEEDED FOR UP TO 5 DAYS 01/09/21  Yes Jon Billings, NP   ?oxyCODONE (OXY IR/ROXICODONE) 5 MG immediate release tablet Take 1 tablet (5 mg total) by mouth every 4 (four) hours as needed for severe pain. 04/13/21  Yes Pabon, Diego F, MD  ?pramipexole (MIRAPEX) 0.5 MG tablet TAKE ONE TABLET BY MOUTH THREE TIMES A DAY 06/19/21  Yes Jon Billings, NP  ?tiZANidine (ZANAFLEX) 2 MG tablet Take 2 mg by mouth 2 (two) times daily. 09/18/21  Yes [provider]  ?VENTOLIN HFA 108 (90 Base) MCG/ACT inhaler INHALE 2 PUFFS BY MOUTH INTO THE LUNGS EVERY 6 HOURS AS NEEDED FOR WHEEZING OR SHORTNESS OF BREATH ?Patient taking differently: Inhale 2 puffs into the lungs every 4 (four) hours as needed (COPD). 02/09/21  Yes Jon Billings, NP  ?AMBULATORY NON FORMULARY MEDICATION Medication Name: nebulizer ?DX: J44.9 12/07/17   Laverle Hobby, MD  ?liraglutide (VICTOZA) 18 MG/3ML SOPN Inject 0.6 mg into the skin daily. 10/01/21   Jon Billings, NP  ?naloxone [facility]) nasal spray 4 mg/0.1 mL SMARTSIG:Both Nares 06/19/21   [provider]  ? ? ?Allergies as of 10/04/2021 - Review Complete 09/27/2021  ?Allergen Reaction Noted  ? Penicillins Hives and Swelling 10/25/2014  ? Acetaminophen  07/12/2019  ? Ciprofloxacin Itching 08/12/2015  ? Aspirin Palpitations 10/25/2014  ? Hydrocodone Hives and Rash 12/01/2014  ? Other Rash 05/07/2015  ? Tape Rash 10/25/2014  ? ? ?Family History  ?Problem Relation Age of Onset  ? Neuropathy Mother   ? Arthritis Father   ? Hypertension Father   ? Leukemia Father   ? Heart disease Father   ?     CAD s/p CABG  ? Healthy Brother   ? Healthy Daughter   ? Healthy Son   ? Dementia Maternal Grandmother   ? Hypertension Maternal Grandmother   ? Hypercholesterolemia Maternal Grandmother   ? Heart disease Maternal Grandmother   ? Healthy Brother   ? Healthy Brother   ? Stroke Neg Hx   ? Cancer Neg Hx   ? ? ?Social History  ? ?Socioeconomic History  ? Marital status: Single  ?  Spouse name: Not on file  ? Number of children: 2  ? Years of education:  Not on file  ? Highest education level: Not on file  ?Occupational History  ? Not on file  ?Tobacco Use  ? Smoking status: Every Day  ?  Packs/day: 0.75  ?  Years: 32.00  ?  Pack years: 24.00  ?  Types: Cigarettes  ? Smokeless tobacco: Never  ? Tobacco comments:  ?  0.5PPD 08/14/2021  ?Vaping Use  ? Vaping Use: Never used  ?Substance and Sexual Activity  ? Alcohol use: Yes  ?  Comment: occassionally  ? Drug use: No  ? Sexual activity: Not Currently  ?Other Topics Concern  ? Not on file  ?Social History Narrative  ? Lives alone  ? ?Social Determinants of Health  ? ?Financial Resource Strain: Low Risk   ? Difficulty of Paying Living Expenses: Not hard at all  ?Food Insecurity: No Food  Insecurity  ? Worried About Charity fundraiser in the Last Year: Never true  ? Ran Out of Food in the Last Year: Never true  ?Transportation Needs: No Transportation Needs  ? Lack of Transportation (Medical): No  ? Lack of Transportation (Non-Medical): No  ?Physical Activity: Insufficiently Active  ? Days of Exercise per Week: 3 days  ? Minutes of Exercise per Session: 20 min  ?Stress: No Stress Concern Present  ? Feeling of Stress : Only a little  ?Social Connections: Moderately Isolated  ? Frequency of Communication with Friends and Family: More than three times a week  ? Frequency of Social Gatherings with Friends and Family: More than three times a week  ? Attends Religious Services: Never  ? Active Member of Clubs or Organizations: Yes  ? Attends Archivist Meetings: Never  ? Marital Status: Divorced  ?Intimate Partner Violence: Not At Risk  ? Fear of Current or Ex-Partner: No  ? Emotionally Abused: No  ? Physically Abused: No  ? Sexually Abused: No  ? ? ?Review of Systems: ?See HPI, otherwise negative ROS ? ?Physical Exam: ?BP 122/67   Pulse 80   Temp 97.8 ?F (36.6 ?C) (Temporal)   Resp 16   Ht '5\' 3"'$  (1.6 m)   Wt 86.2 kg   SpO2 98%   BMI 33.66 kg/m?  ?General:   Alert,  pleasant and cooperative in NAD ?Head:   Normocephalic and atraumatic. ?Neck:  Supple; no masses or thyromegaly. ?Lungs:  Clear throughout to auscultation.    ?Heart:  Regular rate and rhythm. ?Abdomen:  Soft, nontender and nondistended. Normal bowel sounds, wit

## 2021-10-22 NOTE — Anesthesia Preprocedure Evaluation (Addendum)
Anesthesia Evaluation  ?Patient identified by MRN, date of birth, ID band ?Patient awake ? ? ? ?Reviewed: ?Allergy & Precautions, NPO status , Patient's Chart, lab work & pertinent test results ? ?History of Anesthesia Complications ?Negative for: history of anesthetic complications ? ?Airway ?Mallampati: III ? ? ?Neck ROM: Full ? ? ? Dental ? ?(+) Edentulous Upper, Edentulous Lower ?  ?Pulmonary ?asthma , COPD,  COPD inhaler, Current Smoker and Patient abstained from smoking.,  ?  ?Pulmonary exam normal ?breath sounds clear to auscultation ? ? ? ? ? ? Cardiovascular ?Exercise Tolerance: Good ?(-) CAD Normal cardiovascular exam ?Rhythm:Regular Rate:Normal ? ?ECG 04/10/21: normal ?  ?Neuro/Psych ? Headaches, PSYCHIATRIC DISORDERS Anxiety Chronic pain ? Neuromuscular disease (peripheral neuropathy)   ? GI/Hepatic ?GERD  Controlled,Fatty liver ?  ?Endo/Other  ?Hypothyroidism Obesity  ? Renal/GU ?negative Renal ROS  ? ?  ?Musculoskeletal ? ? Abdominal ?(+) + obese,   ?Peds ? Hematology ?negative hematology ROS ?(+)   ?Anesthesia Other Findings ? ? Reproductive/Obstetrics ? ?  ? ? ? ? ? ? ? ? ? ? ? ? ? ?  ?  ? ? ? ? ? ? ? ?Anesthesia Physical ? ?Anesthesia Plan ? ?ASA: 3 ? ?Anesthesia Plan: General  ? ?Post-op Pain Management:   ? ?Induction: Intravenous ? ?PONV Risk Score and Plan: 2 and Treatment may vary due to age or medical condition, Propofol infusion and TIVA ? ?Airway Management Planned: Natural Airway ? ?Additional Equipment:  ? ?Intra-op Plan:  ? ?Post-operative Plan: Extubation in OR ? ?Informed Consent: I have reviewed the patients History and Physical, chart, labs and discussed the procedure including the risks, benefits and alternatives for the proposed anesthesia with the patient or authorized representative who has indicated his/her understanding and acceptance.  ? ? ? ?Dental advisory given ? ?Plan Discussed with: CRNA ? ?Anesthesia Plan Comments: (Patient consented for  risks of anesthesia including but not limited to:  ?- adverse reactions to medications ?- damage to eyes, teeth, lips or other oral mucosa ?- nerve damage due to positioning  ?- sore throat or hoarseness ?- damage to heart, brain, nerves, lungs, other parts of body or loss of life ? ?Informed patient about role of CRNA in peri- and intra-operative care.  Patient voiced understanding.)  ? ? ? ? ? ?Anesthesia Quick Evaluation ? ?

## 2021-10-22 NOTE — Op Note (Signed)
[facility] ?Gastroenterology ?Patient Name: Janet Murray ?Procedure Date: 10/22/2021 10:48 AM ?MRN: [id number] ?Account #: [id number] ?Date of Birth: 1971/01/05 ?Admit Type: Outpatient ?Age: 51 ?Room: [facility] ENDO ROOM 3 ?Gender: Female ?Note Status: Finalized ?Instrument Name: Colonoscope [id number] ?Procedure:             Colonoscopy ?Indications:           Surveillance: Personal history of adenomatous polyps  ?                       on last colonoscopy 3 years ago, Last colonoscopy:  ?                       November 2021 ?Providers:             Lin Landsman MD, MD ?Referring MD:          Forest Gleason Md, MD (Referring MD) ?Medicines:             General Anesthesia ?Complications:         No immediate complications. Estimated blood loss: None. ?Procedure:             Pre-Anesthesia Assessment: ?                       - Prior to the procedure, a History and Physical was  ?                       performed, and patient medications and allergies were  ?                       reviewed. The patient is competent. The risks and  ?                       benefits of the procedure and the sedation options and  ?                       risks were discussed with the patient. All questions  ?                       were answered and informed consent was obtained.  ?                       Patient identification and proposed procedure were  ?                       verified by the physician, the nurse, the  ?                       anesthesiologist, the anesthetist and the technician  ?                       in the pre-procedure area in the procedure room in the  ?                       endoscopy suite. Mental Status Examination: alert and  ?                       oriented. Airway Examination: normal oropharyngeal  ?  airway and neck mobility. Respiratory Examination:  ?                       clear to auscultation. CV Examination: normal.  ?                       Prophylactic Antibiotics: The patient  does not require  ?                       prophylactic antibiotics. Prior Anticoagulants: The  ?                       patient has taken no previous anticoagulant or  ?                       antiplatelet agents. ASA Grade Assessment: III - A  ?                       patient with severe systemic disease. After reviewing  ?                       the risks and benefits, the patient was deemed in  ?                       satisfactory condition to undergo the procedure. The  ?                       anesthesia plan was to use general anesthesia.  ?                       Immediately prior to administration of medications,  ?                       the patient was re-assessed for adequacy to receive  ?                       sedatives. The heart rate, respiratory rate, oxygen  ?                       saturations, blood pressure, adequacy of pulmonary  ?                       ventilation, and response to care were monitored  ?                       throughout the procedure. The physical status of the  ?                       patient was re-assessed after the procedure. ?                       After obtaining informed consent, the colonoscope was  ?                       passed under direct vision. Throughout the procedure,  ?                       the patient's blood pressure, pulse, and oxygen  ?  saturations were monitored continuously. The  ?                       Colonoscope was introduced through the anus and  ?                       advanced to the the cecum, identified by appendiceal  ?                       orifice and ileocecal valve. The colonoscopy was  ?                       performed with moderate difficulty due to inadequate  ?                       bowel prep. Successful completion of the procedure was  ?                       aided by lavage. The patient tolerated the procedure  ?                       well. The quality of the bowel preparation was fair. ?Findings: ?     The perianal and  digital rectal examinations were normal. Pertinent  ?     negatives include normal sphincter tone and no palpable rectal lesions. ?     Two sessile polyps were found in the transverse colon. The polyps were 4  ?     to 5 mm in size. These polyps were removed with a cold snare. Resection  ?     and retrieval were complete. Estimated blood loss: none. ?     Copious quantities of liquid stool was found in the entire colon,  ?     precluding visualization. Lavage of the area was performed using 200 -  ?     500 mL of sterile water, resulting in clearance with fair visualization. ?     Non-bleeding external hemorrhoids were found during retroflexion. The  ?     hemorrhoids were medium-sized. ?Impression:            - Preparation of the colon was fair. ?                       - Two 4 to 5 mm polyps in the transverse colon,  ?                       removed with a cold snare. Resected and retrieved. ?                       - Stool in the entire examined colon. ?                       - Non-bleeding external hemorrhoids. ?Recommendation:        - Repeat colonoscopy in 3 years for surveillance. ?                       - Discharge patient to home (with escort). ?                       - Resume previous diet today. ?                       -  Continue present medications. ?                       - Await pathology results. ?Procedure Code(s):     --- Professional --- ?                       [phone], Colonoscopy, flexible; with removal of  ?                       tumor(s), polyp(s), or other lesion(s) by snare  ?                       technique ?Diagnosis Code(s):     --- Professional --- ?                       Z86.010, Personal history of colonic polyps ?                       K64.4, Residual hemorrhoidal skin tags ?                       K63.5, Polyp of colon ?CPT copyright 2019 [employer]. All rights reserved. ?The codes documented in this report are preliminary and upon coder review may  ?be revised to meet  current compliance requirements. ?Dr. Ulyess Mort ?Shantanique Hodo Raeanne Gathers MD, MD ?10/22/2021 11:20:05 AM ?This report has been signed electronically. ?Number of Addenda: 0 ?Note Initiated On: 10/22/2021 10:48 AM ?Scope Withdrawal Time: 0 hours 15 minutes 6 seconds  ?Total Procedure Duration: 0 hours 18 minutes 56 seconds  ?Estimated Blood Loss:  Estimated blood loss: none. ?     [facility] ?

## 2021-10-22 NOTE — Transfer of Care (Signed)
Immediate Anesthesia Transfer of Care Note ? ?Patient: Janet Murray ? ?Procedure(s) Performed: COLONOSCOPY WITH PROPOFOL ? ?Patient Location: PACU and Endoscopy Unit ? ?Anesthesia Type:General ? ?Level of Consciousness: drowsy ? ?Airway & Oxygen Therapy: Patient Spontanous Breathing ? ?Post-op Assessment: Report given to RN ? ?Post vital signs: stable ? ?Last Vitals:  ?Vitals Value Taken Time  ?BP    ?Temp    ?Pulse 100 10/22/21 1116  ?Resp 15 10/22/21 1116  ?SpO2 100 % 10/22/21 1116  ?Vitals shown include unvalidated device data. ? ?Last Pain:  ?Vitals:  ? 10/22/21 1017  ?TempSrc: Temporal  ?PainSc: 0-No pain  ?   ? ?  ? ?Complications: No notable events documented. ?

## 2021-10-23 ENCOUNTER — Encounter: Payer: Self-pay | Admitting: Gastroenterology

## 2021-10-23 LAB — SURGICAL PATHOLOGY

## 2021-10-24 ENCOUNTER — Encounter: Payer: Self-pay | Admitting: Gastroenterology

## 2021-10-24 ENCOUNTER — Ambulatory Visit: Payer: Medicare Other | Admitting: Endocrinology

## 2021-10-28 ENCOUNTER — Other Ambulatory Visit: Payer: Self-pay | Admitting: Endocrinology

## 2021-10-28 DIAGNOSIS — C73 Malignant neoplasm of thyroid gland: Secondary | ICD-10-CM

## 2021-10-29 ENCOUNTER — Other Ambulatory Visit (INDEPENDENT_AMBULATORY_CARE_PROVIDER_SITE_OTHER): Payer: Medicare Other

## 2021-10-29 DIAGNOSIS — M5416 Radiculopathy, lumbar region: Secondary | ICD-10-CM | POA: Diagnosis not present

## 2021-10-29 DIAGNOSIS — M545 Low back pain, unspecified: Secondary | ICD-10-CM | POA: Diagnosis not present

## 2021-10-29 DIAGNOSIS — C73 Malignant neoplasm of thyroid gland: Secondary | ICD-10-CM

## 2021-10-29 DIAGNOSIS — G894 Chronic pain syndrome: Secondary | ICD-10-CM | POA: Diagnosis not present

## 2021-10-29 LAB — T4, FREE: Free T4: 0.97 ng/dL (ref 0.60–1.60)

## 2021-10-29 LAB — TSH: TSH: 1.25 u[IU]/mL (ref 0.35–5.50)

## 2021-11-01 ENCOUNTER — Ambulatory Visit (INDEPENDENT_AMBULATORY_CARE_PROVIDER_SITE_OTHER): Payer: Medicare Other | Admitting: Endocrinology

## 2021-11-01 ENCOUNTER — Encounter: Payer: Self-pay | Admitting: Endocrinology

## 2021-11-01 VITALS — BP 140/82 | HR 75 | Ht 63.0 in | Wt 195.4 lb

## 2021-11-01 DIAGNOSIS — E890 Postprocedural hypothyroidism: Secondary | ICD-10-CM

## 2021-11-01 DIAGNOSIS — C73 Malignant neoplasm of thyroid gland: Secondary | ICD-10-CM | POA: Diagnosis not present

## 2021-11-01 LAB — TGAB+THYROGLOBULIN IMA OR LCMS: Thyroglobulin Antibody: <1 IU/mL (ref 0.0–0.9)

## 2021-11-01 LAB — THYROGLOBULIN BY IMA: Thyroglobulin by IMA: 0.4 ng/mL — ABNORMAL LOW (ref 1.5–38.5)

## 2021-11-01 NOTE — Progress Notes (Addendum)
Patient ID: Janet Murray, female   DOB: 1971-03-25, 51 y.o.   MRN: [id number]  Reason for Appointment:  Hypothyroidism, followup visit    History of Present Illness:   HYPOTHYROIDISM secondary to total thyroidectomy  Her thyroidectomy was done in 04/2021  The patient has been treated with LEVOTHYROXINE 175 mcg daily The last visit was in 4/21   Although at that time she did not have any complaints of unusual fatigue her Synthroid was increased from 150 x 25 mcg Recently has no complaints of unusual fatigue, cold sensitivity, dry skin, unusual weight gain .            The patient is taking the thyroid supplement very regularly in the morning before breakfast.  Not taking any calcium or iron supplements with the thyroid supplement.    Recent labs as follows  Lab Results  Component Value Date   TSH 1.25 10/29/2021   TSH 6.960 (H) 09/27/2021   TSH 1.200 07/15/2021   FREET4 0.97 10/29/2021   FREET4 1.22 09/27/2021   FREET4 1.73 07/15/2021    THYROID CANCER:  This was diagnosed when she had a thyroid nodule discovered by her PCP on exam She was then referred for surgery Detailed history and evaluation of previous work-up and treatment was reviewed  THYROIDECTOMY in 11/22 showed the following pathology  Tumor Size: 1.6 x 1.2 x 0.8 cm  Histologic Type: Follicular carcinoma, minimally invasive  Angioinvasion (Vascular Invasion): Not identified  Lymphatic Invasion: Not identified  Extrathyroidal Extension: Not identified  Margin Status: All margins negative for carcinoma Negative for lymph node metastases  Other treatment:  RADIOPHARMACEUTICALS:  53.2 mCi I-131 sodium iodide on 09/02/2021  Posttherapy scan only showed thyroid remnant   Last THYROGLOBULIN level:  Lab Results  Component Value Date   THYROGLB 0.4 (L) 10/29/2021        Lab on 10/29/2021  Component Date Value Ref Range Status   Free T4 10/29/2021 0.97  0.60 - 1.60 ng/dL Final   Comment: Specimens  from patients who are undergoing biotin therapy and /or ingesting biotin supplements may contain high levels of biotin.  The higher biotin concentration in these specimens interferes with this Free T4 assay.  Specimens that contain high levels  of biotin may cause false high results for this Free T4 assay.  Please interpret results in light of the total clinical presentation of the patient.     TSH 10/29/2021 1.25  0.35 - 5.50 uIU/mL Final   Thyroglobulin Antibody 10/29/2021 <1.0  0.0 - 0.9 IU/mL Final   Thyroglobulin Antibody measured by Beckman Coulter Methodology   Thyroglobulin by IMA 10/29/2021 0.4 (L)  1.5 - 38.5 ng/mL Final   Comment: According to the [facility] of Clinical Biochemistry, the reference interval for Thyroglobulin (TG) should be related to euthyroid patients and not for patients who underwent thyroidectomy. TG reference intervals for these patients depend on the residual mass of the thyroid tissue left after surgery. Establishing a post-operative baseline is recommended. The assay limit of quantitation is 0.1 ng/mL Thyroglobulin measured by Beckman Coulter Immunometric Assay     Allergies as of 11/01/2021       Reactions   Penicillins Hives, Swelling   Has patient had a PCN reaction causing immediate rash, facial/tongue/throat swelling, SOB or lightheadedness with hypotension: Yes Has patient had a PCN reaction causing severe rash involving mucus membranes or skin necrosis: No Has patient had a PCN reaction that required hospitalization No Has patient had a PCN reaction occurring  within the last 10 years: No If all of the above answers are "NO", then may proceed with Cephalosporin use.   Acetaminophen    Due to polyps    Ciprofloxacin Itching   Aspirin Palpitations   Heart palpitations   Hydrocodone Hives, Rash   Other Rash   bandaids   Tape Rash   bandaids bandaids        Medication List        Accurate as of Nov 01, 2021 12:02 PM. If you have  any questions, ask your nurse or doctor.          AMBULATORY NON FORMULARY MEDICATION Medication Name: nebulizer DX: J44.9   atorvastatin 80 MG tablet Commonly known as: LIPITOR Take 1 tablet (80 mg total) by mouth daily.   Bevespi Aerosphere 9-4.8 MCG/ACT Aero Generic drug: Glycopyrrolate-Formoterol INHALE 2 PUFFS BY MOUTH INTO THE LUNGS 2TIMES DAILY   diclofenac Sodium 1 % Gel Commonly known as: VOLTAREN Apply 1 application topically daily as needed (Knee pain).   ergocalciferol 1.25 MG (50000 UT) capsule Commonly known as: VITAMIN D2 Take by mouth.   esomeprazole 40 MG capsule Commonly known as: NEXIUM TAKE ONE CAPSULE BY MOUTH TWICE A DAY   hydrOXYzine 25 MG tablet Commonly known as: ATARAX Take 1 tablet (25 mg total) by mouth 3 (three) times daily as needed. What changed: reasons to take this   levothyroxine 175 MCG tablet Commonly known as: SYNTHROID Take 1 tablet (175 mcg total) by mouth daily.   montelukast 10 MG tablet Commonly known as: SINGULAIR TAKE ONE TABLET BY MOUTH AT BEDTIME   naloxone 4 MG/0.1ML Liqd nasal spray kit Commonly known as: NARCAN SMARTSIG:Both Nares   ondansetron 4 MG disintegrating tablet Commonly known as: ZOFRAN-ODT TAKE 1 TABLET BY MOUTH EVERY 8 HOURS AS NEEDED FOR UP TO 5 DAYS   oxyCODONE 5 MG immediate release tablet Commonly known as: Oxy IR/ROXICODONE Take 1 tablet (5 mg total) by mouth every 4 (four) hours as needed for severe pain.   pramipexole 0.5 MG tablet Commonly known as: MIRAPEX TAKE ONE TABLET BY MOUTH THREE TIMES A DAY   tiZANidine 2 MG tablet Commonly known as: ZANAFLEX Take 2 mg by mouth 2 (two) times daily.   Ventolin HFA 108 (90 Base) MCG/ACT inhaler Generic drug: albuterol INHALE 2 PUFFS BY MOUTH INTO THE LUNGS EVERY 6 HOURS AS NEEDED FOR WHEEZING OR SHORTNESS OF BREATH What changed:  when to take this reasons to take this   albuterol (2.5 MG/3ML) 0.083% nebulizer solution Commonly known as:  PROVENTIL Take 3 mLs (2.5 mg total) by nebulization every 6 (six) hours as needed for wheezing or shortness of breath. What changed: Another medication with the same name was changed. Make sure you understand how and when to take each.   Victoza 18 MG/3ML Sopn Generic drug: liraglutide Inject 0.6 mg into the skin daily.        Allergies:  Allergies  Allergen Reactions   Penicillins Hives and Swelling    Has patient had a PCN reaction causing immediate rash, facial/tongue/throat swelling, SOB or lightheadedness with hypotension: Yes Has patient had a PCN reaction causing severe rash involving mucus membranes or skin necrosis: No Has patient had a PCN reaction that required hospitalization No Has patient had a PCN reaction occurring within the last 10 years: No If all of the above answers are "NO", then may proceed with Cephalosporin use.   Acetaminophen     Due to polyps    Ciprofloxacin  Itching   Aspirin Palpitations    Heart palpitations   Hydrocodone Hives and Rash   Other Rash    bandaids   Tape Rash    bandaids bandaids    Past Medical History:  Diagnosis Date   Abdominal mass, RUQ (right upper quadrant)    Anxiety    Arthritis    Asthma    Bronchitis    Cancer ([place]) 04/12/2021   Carpal tunnel syndrome of left wrist 10/25/2014   Chronic bronchitis, obstructive (HCC) 07/20/2017   Chronic pain    COPD (chronic obstructive pulmonary disease) (HCC)    Coronary artery disease    Cyst of right kidney 03/22/2018   DDD (degenerative disc disease), lumbar 10/25/2014   Degenerative joint disease (DJD) of hip    Diarrhea of infectious origin 03/25/2018   Dysphagia    Dyspnea    Emphysema of lung (HCC)    Facet syndrome, lumbar 10/25/2014   Fatty liver 03/25/2018   Foot fracture, left 05/02/2015   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   GERD (gastroesophageal reflux disease)    Hereditary and idiopathic peripheral neuropathy 11/06/2014   Hypothyroidism    IBS (irritable  bowel syndrome)    Low back pain    Migraine 02/04/2016   Occipital neuralgia    Osteoarthritis of spine with radiculopathy, lumbar region 10/25/2014   Other intervertebral disc degeneration, lumbar region    Other irritable bowel syndrome 04/20/2017   Other spondylosis with radiculopathy, lumbar region 10/25/2014   RLS (restless legs syndrome)    Sacroiliac joint dysfunction of both sides 10/25/2014   Spondylosis of lumbar spine    Urinary incontinence     Past Surgical History:  Procedure Laterality Date   APPENDECTOMY     BLADDER SURGERY     sling - Dr. Davis Gourd   CESAREAN SECTION     COLONOSCOPY WITH PROPOFOL N/A 04/11/2020   Procedure: COLONOSCOPY WITH PROPOFOL;  Surgeon: Virgel Manifold, MD;  Location: [facility] ENDOSCOPY;  Service: Endoscopy;  Laterality: N/A;   COLONOSCOPY WITH PROPOFOL N/A 10/22/2021   Procedure: COLONOSCOPY WITH PROPOFOL;  Surgeon: Lin Landsman, MD;  Location: [facility] ENDOSCOPY;  Service: Gastroenterology;  Laterality: N/A;   ESOPHAGOGASTRODUODENOSCOPY  04/26/2019   STOMACH SURGERY     THYROIDECTOMY N/A 04/12/2021   Procedure: THYROIDECTOMY, total;  Surgeon: Jules Husbands, MD;  Location: [facility] ORS;  Service: General;  Laterality: N/A;   TOTAL ABDOMINAL HYSTERECTOMY W/ BILATERAL SALPINGOOPHORECTOMY      Family History  Problem Relation Age of Onset   Neuropathy Mother    Arthritis Father    Hypertension Father    Leukemia Father    Heart disease Father        CAD s/p CABG   Healthy Brother    Healthy Daughter    Healthy Son    Dementia Maternal Grandmother    Hypertension Maternal Grandmother    Hypercholesterolemia Maternal Grandmother    Heart disease Maternal Grandmother    Healthy Brother    Healthy Brother    Stroke Neg Hx    Cancer Neg Hx     Social History:  reports that she has been smoking cigarettes. She has a 24.00 pack-year smoking history. She has never used smokeless tobacco. She reports current alcohol use. She reports  that she does not use drugs.  Review of Systems     Examination:   BP 140/82 (BP Location: Left Arm, Patient Position: Sitting, Cuff Size: Normal)   Pulse 75   Ht '5\' 3"'  (1.6  m)   Wt 195 lb 6.4 oz (88.6 kg)   SpO2 95%   BMI 34.61 kg/m   GENERAL APPEARANCE: Alert, no puffiness of the face or eyes  NECK: Thyroid is not palpable  No lymphadenopathy in the neck           NEUROLOGIC EXAM:  biceps reflexes show normal relaxation Skin: Not unusual dry    Assessment   THYROID CANCER: She had a 1.6 cm follicular cancer treated with thyroidectomy and I-131 treatment With remnant ablation her thyroglobulin now is down to 0.4 which is adequate NECK exam is unremarkable  Hypothyroidism: TSH is improved to about 1 and she subjectively doing well   Treatment:   Continue same dosage of levothyroxine before breakfast daily.  To call if she has any unusual fatigue Avoid taking any calcium or iron supplements with the thyroid supplement.   Follow-up in 6 months  Elayne Snare 11/01/2021, 12:02 PM   Note: This office note was prepared with Dragon voice recognition system technology. Any transcriptional errors that result from this process are unintentional.

## 2021-11-02 DIAGNOSIS — M5136 Other intervertebral disc degeneration, lumbar region: Secondary | ICD-10-CM | POA: Diagnosis not present

## 2021-11-02 DIAGNOSIS — M179 Osteoarthritis of knee, unspecified: Secondary | ICD-10-CM | POA: Diagnosis not present

## 2021-11-11 ENCOUNTER — Other Ambulatory Visit: Payer: Medicare Other

## 2021-11-11 ENCOUNTER — Encounter: Payer: Self-pay | Admitting: Nurse Practitioner

## 2021-11-26 ENCOUNTER — Other Ambulatory Visit: Payer: Self-pay | Admitting: Nurse Practitioner

## 2021-11-27 ENCOUNTER — Telehealth: Payer: Self-pay

## 2021-11-27 DIAGNOSIS — M5481 Occipital neuralgia: Secondary | ICD-10-CM | POA: Diagnosis not present

## 2021-11-27 DIAGNOSIS — G894 Chronic pain syndrome: Secondary | ICD-10-CM | POA: Diagnosis not present

## 2021-11-27 DIAGNOSIS — M5136 Other intervertebral disc degeneration, lumbar region: Secondary | ICD-10-CM | POA: Diagnosis not present

## 2021-11-27 DIAGNOSIS — M259 Joint disorder, unspecified: Secondary | ICD-10-CM | POA: Diagnosis not present

## 2021-11-27 MED ORDER — PROAIR RESPICLICK 108 (90 BASE) MCG/ACT IN AEPB: 2.0000 | INHALATION_SPRAY | RESPIRATORY_TRACT | 2 refills | Status: DC | PRN

## 2021-11-27 MED ORDER — ALBUTEROL SULFATE HFA 108 (90 BASE) MCG/ACT IN AERS: 2.0000 | INHALATION_SPRAY | Freq: Four times a day (QID) | RESPIRATORY_TRACT | 4 refills | Status: DC | PRN

## 2021-11-27 NOTE — Telephone Encounter (Signed)
Fax from [place] stating to please send in generic proair in since pt's insurance prefers generic, won't pay for ventolin.   Please advise.

## 2021-11-27 NOTE — Telephone Encounter (Signed)
Spoke with patient regarding medication sent in to pharmacy, pt also asked about Zofran, it was sent also today to her pharmacy. No further questions at this time.

## 2021-11-27 NOTE — Addendum Note (Signed)
Addended by: Marnee Guarneri T on: 11/27/2021 12:46 PM   Modules accepted: Orders

## 2021-11-27 NOTE — Telephone Encounter (Signed)
Requested medication (s) are due for refill today: yes  Requested medication (s) are on the active medication list: yes  Last refill:  01/09/21  Future visit scheduled: yes  Notes to clinic:  Unable to refill per protocol, cannot delegate.      Requested Prescriptions  Pending Prescriptions Disp Refills   ondansetron (ZOFRAN-ODT) 4 MG disintegrating tablet [Pharmacy Med Name: ONDANSETRON 4 MG ODT] 30 tablet 0    Sig: TAKE 1 TABLET BY MOUTH EVERY 8 HOURS AS NEEDED     Not Delegated - Gastroenterology: Antiemetics - ondansetron Failed - 11/26/2021  4:08 PM      Failed - This refill cannot be delegated      Passed - AST in normal range and within 360 days    AST  Date Value Ref Range Status  09/27/2021 13 0 - 40 IU/L Final   SGOT(AST)  Date Value Ref Range Status  10/29/2013 17 15 - 37 Unit/L Final         Passed - ALT in normal range and within 360 days    ALT  Date Value Ref Range Status  09/27/2021 5 0 - 32 IU/L Final   SGPT (ALT)  Date Value Ref Range Status  10/29/2013 13 12 - 78 U/L Final         Passed - Valid encounter within last 6 months    Recent Outpatient Visits           2 months ago Acquired hypothyroidism   [facility] Jon Billings, NP   4 months ago Osteoarthritis of spine with radiculopathy, lumbar region   [place], Santiago Glad, NP   4 months ago Allergic rhinitis, unspecified seasonality, unspecified trigger   [facility] Jon Billings, NP   5 months ago Acquired hypothyroidism   [facility] Jon Billings, NP   6 months ago Pulmonary emphysema, unspecified emphysema type ([place])   [place] Jon Billings, NP       Future Appointments             In 1 month Jon Billings, NP [facility] [place]

## 2021-11-27 NOTE — Telephone Encounter (Signed)
Copied from [place] [id number]. Topic: General - Other >> Nov 27, 2021  1:12 PM Ja-Kwan M wrote: Reason for CRM: Dawn with [facility] requests that the Rx for Albuterol Sulfate (PROAIR RESPICLICK) 773 (90 Base) MCG/ACT AEPB be changed to the aerosol instead of the dry powder. Cb# [phone]   Routing to provider.

## 2021-11-30 ENCOUNTER — Encounter: Payer: Self-pay | Admitting: Emergency Medicine

## 2021-11-30 ENCOUNTER — Emergency Department
Admission: EM | Admit: 2021-11-30 | Discharge: 2021-11-30 | Disposition: A | Discharge status: Home / Self Care | Payer: Medicare Other | Attending: Student in an Organized Health Care Education/Training Program | Admitting: Student in an Organized Health Care Education/Training Program

## 2021-11-30 ENCOUNTER — Emergency Department: Payer: Medicare Other

## 2021-11-30 ENCOUNTER — Other Ambulatory Visit: Payer: Self-pay

## 2021-11-30 DIAGNOSIS — I2510 Atherosclerotic heart disease of native coronary artery without angina pectoris: Secondary | ICD-10-CM | POA: Diagnosis not present

## 2021-11-30 DIAGNOSIS — J449 Chronic obstructive pulmonary disease, unspecified: Secondary | ICD-10-CM | POA: Diagnosis not present

## 2021-11-30 DIAGNOSIS — S6991XA Unspecified injury of right wrist, hand and finger(s), initial encounter: Secondary | ICD-10-CM | POA: Diagnosis present

## 2021-11-30 DIAGNOSIS — M25532 Pain in left wrist: Secondary | ICD-10-CM | POA: Diagnosis not present

## 2021-11-30 DIAGNOSIS — E039 Hypothyroidism, unspecified: Secondary | ICD-10-CM | POA: Insufficient documentation

## 2021-11-30 DIAGNOSIS — Z85850 Personal history of malignant neoplasm of thyroid: Secondary | ICD-10-CM | POA: Diagnosis not present

## 2021-11-30 DIAGNOSIS — W010XXA Fall on same level from slipping, tripping and stumbling without subsequent striking against object, initial encounter: Secondary | ICD-10-CM | POA: Insufficient documentation

## 2021-11-30 DIAGNOSIS — S52502A Unspecified fracture of the lower end of left radius, initial encounter for closed fracture: Secondary | ICD-10-CM | POA: Diagnosis not present

## 2021-11-30 DIAGNOSIS — S52592A Other fractures of lower end of left radius, initial encounter for closed fracture: Secondary | ICD-10-CM | POA: Diagnosis not present

## 2021-11-30 DIAGNOSIS — Y92007 Garden or yard of unspecified non-institutional (private) residence as the place of occurrence of the external cause: Secondary | ICD-10-CM | POA: Diagnosis not present

## 2021-12-02 DIAGNOSIS — M179 Osteoarthritis of knee, unspecified: Secondary | ICD-10-CM | POA: Diagnosis not present

## 2021-12-02 DIAGNOSIS — M5136 Other intervertebral disc degeneration, lumbar region: Secondary | ICD-10-CM | POA: Diagnosis not present

## 2021-12-03 ENCOUNTER — Telehealth: Payer: Self-pay | Admitting: *Deleted

## 2021-12-03 DIAGNOSIS — S52502A Unspecified fracture of the lower end of left radius, initial encounter for closed fracture: Secondary | ICD-10-CM | POA: Diagnosis not present

## 2021-12-11 DIAGNOSIS — M25532 Pain in left wrist: Secondary | ICD-10-CM | POA: Diagnosis not present

## 2021-12-13 ENCOUNTER — Encounter: Payer: Self-pay | Admitting: Nurse Practitioner

## 2021-12-13 ENCOUNTER — Ambulatory Visit (INDEPENDENT_AMBULATORY_CARE_PROVIDER_SITE_OTHER): Payer: Medicare Other | Admitting: Nurse Practitioner

## 2021-12-13 VITALS — BP 110/68 | HR 70 | Temp 99.3°F | Wt 193.0 lb

## 2021-12-13 DIAGNOSIS — S62102D Fracture of unspecified carpal bone, left wrist, subsequent encounter for fracture with routine healing: Secondary | ICD-10-CM | POA: Diagnosis not present

## 2021-12-13 DIAGNOSIS — M4726 Other spondylosis with radiculopathy, lumbar region: Secondary | ICD-10-CM | POA: Diagnosis not present

## 2021-12-13 NOTE — Progress Notes (Signed)
BP 110/68   Pulse 70   Temp 99.3 F (37.4 C) (Oral)   Wt 193 lb (87.5 kg)   SpO2 95%   BMI 34.19 kg/m    Subjective:    Patient ID: Janet Murray, female    DOB: December 04, 1970, 51 y.o.   MRN: [id number]  HPI: Janet Murray is a 51 y.o. female  Chief Complaint  Patient presents with   Hospitalization Follow-up   Patient presents to clinic for follow up after ER visit for broken distal radius on the Left side.  She is now wearing a cast.  She will need to have an MRI on Tuesday and may need to have pins in it.  She is seeing [facility] for this.     Relevant past medical, surgical, family and social history reviewed and updated as indicated. Interim medical history since our last visit reviewed. Allergies and medications reviewed and updated.  Review of Systems  Musculoskeletal:        Left wrist fracture    Per HPI unless specifically indicated above     Objective:    BP 110/68   Pulse 70   Temp 99.3 F (37.4 C) (Oral)   Wt 193 lb (87.5 kg)   SpO2 95%   BMI 34.19 kg/m   Wt Readings from Last 3 Encounters:  12/13/21 193 lb (87.5 kg)  11/30/21 195 lb 5.2 oz (88.6 kg)  11/01/21 195 lb 6.4 oz (88.6 kg)    Physical Exam Vitals and nursing note reviewed.  Constitutional:      General: She is not in acute distress.    Appearance: Normal appearance. She is normal weight. She is not ill-appearing, toxic-appearing or diaphoretic.  HENT:     Head: Normocephalic.     Right Ear: External ear normal.     Left Ear: External ear normal.     Nose: Nose normal.     Mouth/Throat:     Mouth: Mucous membranes are moist.     Pharynx: Oropharynx is clear.  Eyes:     General:        Right eye: No discharge.        Left eye: No discharge.     Extraocular Movements: Extraocular movements intact.     Conjunctiva/sclera: Conjunctivae normal.     Pupils: Pupils are equal, round, and reactive to light.  Cardiovascular:     Rate and Rhythm: Normal rate and regular rhythm.      Heart sounds: No murmur heard. Pulmonary:     Effort: Pulmonary effort is normal. No respiratory distress.     Breath sounds: Normal breath sounds. No wheezing or rales.  Musculoskeletal:     Cervical back: Normal range of motion and neck supple.  Skin:    General: Skin is warm and dry.     Capillary Refill: Capillary refill takes less than 2 seconds.  Neurological:     General: No focal deficit present.     Mental Status: She is alert and oriented to person, place, and time. Mental status is at baseline.  Psychiatric:        Mood and Affect: Mood normal.        Behavior: Behavior normal.        Thought Content: Thought content normal.        Judgment: Judgment normal.     Results for orders placed or performed in visit on 10/29/21  T4, free  Result Value Ref Range   Free T4  0.97 0.60 - 1.60 ng/dL  TSH  Result Value Ref Range   TSH 1.25 0.35 - 5.50 uIU/mL  TgAb+Thyroglobulin IMA or LCMS  Result Value Ref Range   Thyroglobulin Antibody <1.0 0.0 - 0.9 IU/mL  Thyroglobulin by IMA  Result Value Ref Range   Thyroglobulin by IMA 0.4 (L) 1.5 - 38.5 ng/mL      Assessment & Plan:   Problem List Items Addressed This Visit       Nervous and Auditory   Osteoarthritis of spine with radiculopathy, lumbar region - Primary    Referral placed for Neurosurgeon at patient's request.  Ptient was told by her Pain doctor, Dr. Primus Bravo that it would be beneficial for her to see a Neurosurgeon.      Relevant Orders   Ambulatory referral to Neurosurgery   Other Visit Diagnoses     Closed fracture of left wrist with routine healing, subsequent encounter       Continue to follow up with [facility].  Continue to follow their recommendations.  Keep appt for MRI on July 11.        Follow up plan: No follow-ups on file.

## 2021-12-13 NOTE — Assessment & Plan Note (Signed)
Referral placed for Neurosurgeon at patient's request.  Ptient was told by her Pain doctor, Dr. Primus Bravo that it would be beneficial for her to see a Neurosurgeon.

## 2021-12-16 ENCOUNTER — Other Ambulatory Visit: Payer: Self-pay | Admitting: Nurse Practitioner

## 2021-12-16 DIAGNOSIS — K219 Gastro-esophageal reflux disease without esophagitis: Secondary | ICD-10-CM

## 2021-12-17 ENCOUNTER — Telehealth: Payer: Self-pay

## 2021-12-17 DIAGNOSIS — M25532 Pain in left wrist: Secondary | ICD-10-CM | POA: Diagnosis not present

## 2021-12-17 NOTE — Telephone Encounter (Signed)
Referral from [facility] practice for lumbar radiculopathy. Patient has had a MRI but I dont see any PT or ESIs. Schedule with Danielle please.

## 2021-12-17 NOTE — Telephone Encounter (Signed)
Requested Prescriptions  Pending Prescriptions Disp Refills  . montelukast (SINGULAIR) 10 MG tablet [Pharmacy Med Name: MONTELUKAST SODIUM 10 MG TAB] 90 tablet 2    Sig: TAKE 1 TABLET BY MOUTH AT BEDTIME     Pulmonology:  Leukotriene Inhibitors Passed - 12/16/2021  1:59 PM      Passed - Valid encounter within last 12 months    Recent Outpatient Visits          4 days ago Osteoarthritis of spine with radiculopathy, lumbar region   [facility] Jon Billings, NP   2 months ago Acquired hypothyroidism   [facility] Jon Billings, NP   5 months ago Osteoarthritis of spine with radiculopathy, lumbar region   [place], Santiago Glad, NP   5 months ago Allergic rhinitis, unspecified seasonality, unspecified trigger   [facility] Jon Billings, NP   6 months ago Acquired hypothyroidism   [facility] Jon Billings, NP      Future Appointments            In 1 week Jon Billings, NP [place]           . levothyroxine (SYNTHROID) 175 MCG tablet [Pharmacy Med Name: LEVOTHYROXINE SODIUM 175 MCG TAB] 90 tablet 2    Sig: TAKE 1 TABLET BY MOUTH DAILY     Endocrinology:  Hypothyroid Agents Passed - 12/16/2021  1:59 PM      Passed - TSH in normal range and within 360 days    TSH  Date Value Ref Range Status  10/29/2021 1.25 0.35 - 5.50 uIU/mL Final         Passed - Valid encounter within last 12 months    Recent Outpatient Visits          4 days ago Osteoarthritis of spine with radiculopathy, lumbar region   [facility] Jon Billings, NP   2 months ago Acquired hypothyroidism   [facility] Jon Billings, NP   5 months ago Osteoarthritis of spine with radiculopathy, lumbar region   [place], Santiago Glad, NP   5 months ago Allergic rhinitis, unspecified seasonality, unspecified trigger   [facility] Jon Billings, NP   6 months ago Acquired hypothyroidism   [facility] Jon Billings, NP      Future Appointments            In 1 week Jon Billings, NP [place]           . esomeprazole ([place]) 40 MG capsule [Pharmacy Med Name: ESOMEPRAZOLE MAGNESIUM 40 MG CAP] 180 capsule 2    Sig: TAKE ONE CAPSULE BY MOUTH TWICE A DAY     Gastroenterology: Proton Pump Inhibitors 2 Passed - 12/16/2021  1:59 PM      Passed - ALT in normal range and within 360 days    ALT  Date Value Ref Range Status  09/27/2021 5 0 - 32 IU/L Final   SGPT (ALT)  Date Value Ref Range Status  10/29/2013 13 12 - 78 U/L Final         Passed - AST in normal range and within 360 days    AST  Date Value Ref Range Status  09/27/2021 13 0 - 40 IU/L Final   SGOT(AST)  Date Value Ref Range Status  10/29/2013 17 15 - 37 Unit/L Final         Passed - Valid encounter within last 12 months  Recent Outpatient Visits          4 days ago Osteoarthritis of spine with radiculopathy, lumbar region   [facility] Jon Billings, NP   2 months ago Acquired hypothyroidism   [facility] Jon Billings, NP   5 months ago Osteoarthritis of spine with radiculopathy, lumbar region   [place], Santiago Glad, NP   5 months ago Allergic rhinitis, unspecified seasonality, unspecified trigger   [facility] Jon Billings, NP   6 months ago Acquired hypothyroidism   [facility] Jon Billings, NP      Future Appointments            In 1 week Jon Billings, NP [facility] [place]

## 2021-12-23 NOTE — Telephone Encounter (Signed)
Please schedule with Danielle. 

## 2021-12-24 DIAGNOSIS — M25532 Pain in left wrist: Secondary | ICD-10-CM | POA: Diagnosis not present

## 2021-12-24 DIAGNOSIS — M5136 Other intervertebral disc degeneration, lumbar region: Secondary | ICD-10-CM | POA: Diagnosis not present

## 2021-12-24 DIAGNOSIS — M5481 Occipital neuralgia: Secondary | ICD-10-CM | POA: Diagnosis not present

## 2021-12-24 DIAGNOSIS — M259 Joint disorder, unspecified: Secondary | ICD-10-CM | POA: Diagnosis not present

## 2021-12-24 DIAGNOSIS — G894 Chronic pain syndrome: Secondary | ICD-10-CM | POA: Diagnosis not present

## 2021-12-24 DIAGNOSIS — M9904 Segmental and somatic dysfunction of sacral region: Secondary | ICD-10-CM | POA: Diagnosis not present

## 2021-12-27 ENCOUNTER — Ambulatory Visit (INDEPENDENT_AMBULATORY_CARE_PROVIDER_SITE_OTHER): Payer: Medicare Other | Admitting: Nurse Practitioner

## 2021-12-27 ENCOUNTER — Encounter: Payer: Self-pay | Admitting: Nurse Practitioner

## 2021-12-27 VITALS — BP 109/77 | HR 86 | Temp 99.1°F | Wt 191.3 lb

## 2021-12-27 DIAGNOSIS — E7800 Pure hypercholesterolemia, unspecified: Secondary | ICD-10-CM

## 2021-12-27 DIAGNOSIS — D696 Thrombocytopenia, unspecified: Secondary | ICD-10-CM

## 2021-12-27 DIAGNOSIS — I700 Atherosclerosis of aorta: Secondary | ICD-10-CM | POA: Diagnosis not present

## 2021-12-27 DIAGNOSIS — J439 Emphysema, unspecified: Secondary | ICD-10-CM

## 2021-12-27 DIAGNOSIS — E039 Hypothyroidism, unspecified: Secondary | ICD-10-CM | POA: Diagnosis not present

## 2021-12-27 DIAGNOSIS — E559 Vitamin D deficiency, unspecified: Secondary | ICD-10-CM

## 2021-12-27 DIAGNOSIS — I25118 Atherosclerotic heart disease of native coronary artery with other forms of angina pectoris: Secondary | ICD-10-CM | POA: Diagnosis not present

## 2021-12-27 MED ORDER — VICTOZA 18 MG/3ML ~~LOC~~ SOPN
1.2000 mg | PEN_INJECTOR | Freq: Every day | SUBCUTANEOUS | 1 refills | Status: DC
Start: 2021-12-27 — End: 2022-04-16

## 2021-12-27 NOTE — Assessment & Plan Note (Signed)
Chronic.  Controlled.  Continue with current medication regimen of Atorvastatin 80mg daily.  Labs ordered today.  Return to clinic in 6 months for reevaluation.  Call sooner if concerns arise.  

## 2021-12-27 NOTE — Assessment & Plan Note (Signed)
Chronic.  Controlled.  Continue with current medication regimen.  Followed by Pulmonology.  Labs ordered today.  Return to clinic in 6 months for reevaluation.  Call sooner if concerns arise.

## 2021-12-27 NOTE — Assessment & Plan Note (Signed)
Followed by Endocrinology. Has appt scheduled for November.  Continue collaboration with Endo.

## 2021-12-27 NOTE — Progress Notes (Signed)
BP 109/77   Pulse 86   Temp 99.1 F (37.3 C) (Oral)   Wt 191 lb 4.8 oz (86.8 kg)   SpO2 96%   BMI 33.89 kg/m    Subjective:    Patient ID: Janet Murray, female    DOB: December 30, 1970, 51 y.o.   MRN: [id number]  HPI: Janet Murray is a 51 y.o. female  Chief Complaint  Patient presents with   Hyperlipidemia   Hypertension   Diabetes   COPD COPD status: controlled Satisfied with current treatment?: yes Oxygen use: no Dyspnea frequency:  Cough frequency:  Rescue inhaler frequency:   Limitation of activity: no Productive cough:  Last Spirometry:  Pneumovax: Not up to Date Influenza: Not up to Date   HYPOTHYROIDISM She will follow up with endocrinology in August    Denies HA, CP, SOB, dizziness, palpitations, visual changes, and lower extremity swelling.  Relevant past medical, surgical, family and social history reviewed and updated as indicated. Interim medical history since our last visit reviewed. Allergies and medications reviewed and updated.  Review of Systems  Constitutional:  Negative for fever and unexpected weight change.  Eyes:  Negative for visual disturbance.  Respiratory:  Negative for cough, chest tightness and shortness of breath.   Cardiovascular:  Negative for chest pain, palpitations and leg swelling.  Endocrine: Negative for cold intolerance.  Neurological:  Negative for dizziness and headaches.    Per HPI unless specifically indicated above     Objective:    BP 109/77   Pulse 86   Temp 99.1 F (37.3 C) (Oral)   Wt 191 lb 4.8 oz (86.8 kg)   SpO2 96%   BMI 33.89 kg/m   Wt Readings from Last 3 Encounters:  12/27/21 191 lb 4.8 oz (86.8 kg)  12/13/21 193 lb (87.5 kg)  11/30/21 195 lb 5.2 oz (88.6 kg)    Physical Exam Vitals and nursing note reviewed.  Constitutional:      General: She is not in acute distress.    Appearance: Normal appearance. She is obese. She is not ill-appearing, toxic-appearing or diaphoretic.  HENT:     Head:  Normocephalic.     Right Ear: External ear normal.     Left Ear: External ear normal.     Nose: Nose normal.     Mouth/Throat:     Mouth: Mucous membranes are moist.     Pharynx: Oropharynx is clear.  Eyes:     General:        Right eye: No discharge.        Left eye: No discharge.     Extraocular Movements: Extraocular movements intact.     Conjunctiva/sclera: Conjunctivae normal.     Pupils: Pupils are equal, round, and reactive to light.  Cardiovascular:     Rate and Rhythm: Normal rate and regular rhythm.     Heart sounds: No murmur heard. Pulmonary:     Effort: Pulmonary effort is normal. No respiratory distress.     Breath sounds: Normal breath sounds. No wheezing or rales.  Musculoskeletal:     Cervical back: Normal range of motion and neck supple.  Skin:    General: Skin is warm and dry.     Capillary Refill: Capillary refill takes less than 2 seconds.  Neurological:     General: No focal deficit present.     Mental Status: She is alert and oriented to person, place, and time. Mental status is at baseline.  Psychiatric:  Mood and Affect: Mood normal.        Behavior: Behavior normal.        Thought Content: Thought content normal.        Judgment: Judgment normal.     Results for orders placed or performed in visit on 10/29/21  T4, free  Result Value Ref Range   Free T4 0.97 0.60 - 1.60 ng/dL  TSH  Result Value Ref Range   TSH 1.25 0.35 - 5.50 uIU/mL  TgAb+Thyroglobulin IMA or LCMS  Result Value Ref Range   Thyroglobulin Antibody <1.0 0.0 - 0.9 IU/mL  Thyroglobulin by IMA  Result Value Ref Range   Thyroglobulin by IMA 0.4 (L) 1.5 - 38.5 ng/mL      Assessment & Plan:   Problem List Items Addressed This Visit       Cardiovascular and Mediastinum   Coronary artery disease    Chronic.  Controlled.  Continue with current medication regimen of Atorvastatin 8m daily.  Labs ordered today.  Return to clinic in 6 months for reevaluation.  Call sooner if  concerns arise.        Relevant Orders   Comp Met (CMET)   Atherosclerosis of aorta (HCC)    Chronic.  Controlled.  Continue with current medication regimen of Atorvastatin 873mdaily.  Labs ordered today.  Return to clinic in 6 months for reevaluation.  Call sooner if concerns arise.         Respiratory   COPD (chronic obstructive pulmonary disease) with emphysema (HCC)    Chronic.  Controlled.  Continue with current medication regimen.  Followed by Pulmonology.  Labs ordered today.  Return to clinic in 6 months for reevaluation.  Call sooner if concerns arise.        Relevant Orders   Comp Met (CMET)     Endocrine   Acquired hypothyroidism    Followed by Endocrinology. Has appt scheduled for November.  Continue collaboration with Endo.         Hematopoietic and Hemostatic   Thrombocytopenia ([place]   Labs ordered today. Will make recommendations based on lab results.      Relevant Orders   CBC w/Diff     Other   High cholesterol    Chronic.  Controlled.  Continue with current medication regimen of Atorvastatin 8068maily.  Labs ordered today.  Return to clinic in 6 months for reevaluation.  Call sooner if concerns arise.        Other Visit Diagnoses     Vitamin D deficiency    -  Primary   Relevant Orders   Vitamin D (25 hydroxy)        Follow up plan: Return in about 6 months (around 06/29/2022) for Physical and Fasting labs.

## 2021-12-27 NOTE — Assessment & Plan Note (Signed)
Labs ordered today.  Will make recommendations based on lab results. ?

## 2021-12-28 LAB — COMPREHENSIVE METABOLIC PANEL
ALT: 8 IU/L (ref 0–32)
AST: 12 IU/L (ref 0–40)
Albumin/Globulin Ratio: 2.4 — ABNORMAL HIGH (ref 1.2–2.2)
Albumin: 4.5 g/dL (ref 3.8–4.9)
Alkaline Phosphatase: 100 IU/L (ref 44–121)
BUN/Creatinine Ratio: 11 (ref 9–23)
BUN: 6 mg/dL (ref 6–24)
Bilirubin Total: <0.2 mg/dL (ref 0.0–1.2)
CO2: 22 mmol/L (ref 20–29)
Calcium: 9.5 mg/dL (ref 8.7–10.2)
Chloride: 103 mmol/L (ref 96–106)
Creatinine, Ser: 0.57 mg/dL (ref 0.57–1.00)
Globulin, Total: 1.9 g/dL (ref 1.5–4.5)
Glucose: 116 mg/dL — ABNORMAL HIGH (ref 70–99)
Potassium: 4 mmol/L (ref 3.5–5.2)
Sodium: 141 mmol/L (ref 134–144)
Total Protein: 6.4 g/dL (ref 6.0–8.5)
eGFR: 110 mL/min/{1.73_m2} (ref >59)

## 2021-12-28 LAB — CBC WITH DIFFERENTIAL/PLATELET
Basophils Absolute: 0.1 10*3/uL (ref 0.0–0.2)
Basos: 1 %
EOS (ABSOLUTE): 0.2 10*3/uL (ref 0.0–0.4)
Eos: 3 %
Hematocrit: 39.4 % (ref 34.0–46.6)
Hemoglobin: 13.8 g/dL (ref 11.1–15.9)
Immature Grans (Abs): 0 10*3/uL (ref 0.0–0.1)
Immature Granulocytes: 0 %
Lymphocytes Absolute: 2.1 10*3/uL (ref 0.7–3.1)
Lymphs: 35 %
MCH: 34.3 pg — ABNORMAL HIGH (ref 26.6–33.0)
MCHC: 35 g/dL (ref 31.5–35.7)
MCV: 98 fL — ABNORMAL HIGH (ref 79–97)
Monocytes Absolute: 0.4 10*3/uL (ref 0.1–0.9)
Monocytes: 7 %
Neutrophils Absolute: 3.2 10*3/uL (ref 1.4–7.0)
Neutrophils: 54 %
RBC: 4.02 x10E6/uL (ref 3.77–5.28)
RDW: 12 % (ref 11.7–15.4)
WBC: 5.9 10*3/uL (ref 3.4–10.8)

## 2021-12-28 LAB — VITAMIN D 25 HYDROXY (VIT D DEFICIENCY, FRACTURES): Vit D, 25-Hydroxy: 10.1 ng/mL — ABNORMAL LOW (ref 30.0–100.0)

## 2021-12-30 MED ORDER — VITAMIN D (ERGOCALCIFEROL) 1.25 MG (50000 UNIT) PO CAPS: 50000.0000 [IU] | ORAL_CAPSULE | ORAL | 1 refills | Status: DC

## 2021-12-30 NOTE — Addendum Note (Signed)
Addended by: Jon Billings on: 12/30/2021 07:58 AM   Modules accepted: Orders

## 2021-12-30 NOTE — Progress Notes (Signed)
Hi Janet Murray. It was good to see you last week.  Your glucose is elevated. Make sure we decrease your carbohydrate intake.  Your vitamin D is very low.  We need to start a vitamin D supplement.  It will be 50,000IU once weekly. I have sent this to the pharmacy.  No other concerns at this time.  Continue with other medications.

## 2022-01-01 DIAGNOSIS — M179 Osteoarthritis of knee, unspecified: Secondary | ICD-10-CM | POA: Diagnosis not present

## 2022-01-01 DIAGNOSIS — M5136 Other intervertebral disc degeneration, lumbar region: Secondary | ICD-10-CM | POA: Diagnosis not present

## 2022-01-16 ENCOUNTER — Encounter: Payer: Self-pay | Admitting: Neurosurgery

## 2022-01-16 ENCOUNTER — Ambulatory Visit (INDEPENDENT_AMBULATORY_CARE_PROVIDER_SITE_OTHER): Payer: Medicare Other | Admitting: Neurosurgery

## 2022-01-16 VITALS — BP 128/82 | Ht 63.0 in | Wt 194.8 lb

## 2022-01-16 DIAGNOSIS — G8929 Other chronic pain: Secondary | ICD-10-CM | POA: Diagnosis not present

## 2022-01-16 DIAGNOSIS — M5441 Lumbago with sciatica, right side: Secondary | ICD-10-CM

## 2022-01-16 NOTE — Progress Notes (Signed)
Referring Physician:  Jon Billings, NP [address] [place],  [place] [address]  Primary Physician:  Jon Billings, NP  History of Present Illness: 01/16/2022 Ms. Janet Murray is a 51 y.o of thyroid cancer s/p resection, hyperlipidemia, GERD, reactive airway disease, and chronic pain here today with a chief complaint of 15 years of low back pain worsening over the last year with associated pain that radiates into her right hip and down the length of her right leg into her calf.  She states that this has started over the last year or so and progressively worsened.  It is worse with ambulation and improved some with rest.  She states that she has a hard time getting up from a seated position as this exacerbates her back pain.  She has been under the care of of Dr. Gerald Stabs for pain management and is prescribed hydrocodone, Voltaren gel, and Zanaflex which provides some relief.  She denies any left-sided symptoms, bowel or bladder dysfunction. Of note she is a daily smoker.  Conservative measures:  Physical therapy: No recent formal physical therapy but does do home exercises Multimodal medical therapy including regular antiinflammatories: As above Injections:  epidural steroid injections 5 years ago with minimal relief  Past Surgery: No previous lumbar surgery  EXA BOMBA has no symptoms of cervical myelopathy.  The symptoms are causing a significant impact on the patient's life.   Review of Systems:  A 10 point review of systems is negative, except for the pertinent positives and negatives detailed in the HPI.  Past Medical History: Past Medical History:  Diagnosis Date   Abdominal mass, RUQ (right upper quadrant)    Anxiety    Arthritis    Asthma    Bronchitis    Cancer ([place]) 04/12/2021   Carpal tunnel syndrome of left wrist 10/25/2014   Chronic bronchitis, obstructive (HCC) 07/20/2017   Chronic pain    COPD (chronic obstructive pulmonary disease) (HCC)    Coronary  artery disease    Cyst of right kidney 03/22/2018   DDD (degenerative disc disease), lumbar 10/25/2014   Degenerative joint disease (DJD) of hip    Diarrhea of infectious origin 03/25/2018   Dysphagia    Dyspnea    Emphysema of lung (HCC)    Facet syndrome, lumbar 10/25/2014   Fatty liver 03/25/2018   Foot fracture, left 05/02/2015   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   GERD (gastroesophageal reflux disease)    Hereditary and idiopathic peripheral neuropathy 11/06/2014   Hypothyroidism    IBS (irritable bowel syndrome)    Low back pain    Migraine 02/04/2016   Occipital neuralgia    Osteoarthritis of spine with radiculopathy, lumbar region 10/25/2014   Other intervertebral disc degeneration, lumbar region    Other irritable bowel syndrome 04/20/2017   Other spondylosis with radiculopathy, lumbar region 10/25/2014   RLS (restless legs syndrome)    Sacroiliac joint dysfunction of both sides 10/25/2014   Spondylosis of lumbar spine    Urinary incontinence     Past Surgical History: Past Surgical History:  Procedure Laterality Date   APPENDECTOMY     BLADDER SURGERY     sling - Dr. Davis Gourd   CESAREAN SECTION     COLONOSCOPY WITH PROPOFOL N/A 04/11/2020   Procedure: COLONOSCOPY WITH PROPOFOL;  Surgeon: Virgel Manifold, MD;  Location: [facility] END[facility]COPY;  Service: Endoscopy;  Laterality: N/A;   COLONOSCOPY WITH PROPOFOL N/A 10/22/2021   Procedure: COLONOSCOPY WITH PROPOFOL;  Surgeon: Lin Landsman, MD;  Location: [facility] ENDOSCOPY;  Service: Gastroenterology;  Laterality: N/A;   ESOPHAGOGASTRODUODENOSCOPY  04/26/2019   STOMACH SURGERY     THYROIDECTOMY N/A 04/12/2021   Procedure: THYROIDECTOMY, total;  Surgeon: Jules Husbands, MD;  Location: [facility] ORS;  Service: General;  Laterality: N/A;   TOTAL ABDOMINAL HYSTERECTOMY W/ BILATERAL SALPINGOOPHORECTOMY      Allergies: Allergies as of 01/16/2022 - Review Complete 01/16/2022  Allergen Reaction Noted   Penicillins Hives and  Swelling 10/25/2014   Acetaminophen  07/12/2019   Ciprofloxacin Itching 08/12/2015   Aspirin Palpitations 10/25/2014   Hydrocodone Hives and Rash 12/01/2014   Tape Rash 10/25/2014    Medications: Outpatient Encounter Medications as of 01/16/2022  Medication Sig   albuterol (PROVENTIL) (2.5 MG/3ML) 0.083% nebulizer solution Take 3 mLs (2.5 mg total) by nebulization every 6 (six) hours as needed for wheezing or shortness of breath.   albuterol (VENTOLIN HFA) 108 (90 Base) MCG/ACT inhaler Inhale 2 puffs into the lungs every 6 (six) hours as needed for wheezing or shortness of breath.   AMBULATORY NON FORMULARY MEDICATION Medication Name: nebulizer DX: J44.9   atorvastatin (LIPITOR) 80 MG tablet Take 1 tablet (80 mg total) by mouth daily.   diclofenac Sodium (VOLTAREN) 1 % GEL Apply 1 application topically daily as needed (Knee pain).   esomeprazole (NEXIUM) 40 MG capsule TAKE ONE CAPSULE BY MOUTH TWICE A DAY   Glycopyrrolate-Formoterol (BEVESPI AEROSPHERE) 9-4.8 MCG/ACT AERO INHALE 2 PUFFS BY MOUTH INTO THE LUNGS 2TIMES DAILY   hydrOXYzine (ATARAX/VISTARIL) 25 MG tablet Take 1 tablet (25 mg total) by mouth 3 (three) times daily as needed. (Patient taking differently: Take 25 mg by mouth 3 (three) times daily as needed for anxiety.)   levothyroxine (SYNTHROID) 175 MCG tablet TAKE 1 TABLET BY MOUTH DAILY   liraglutide (VICTOZA) 18 MG/3ML SOPN Inject 1.2 mg into the skin daily.   montelukast (SINGULAIR) 10 MG tablet TAKE 1 TABLET BY MOUTH AT BEDTIME   naloxone (NARCAN) nasal spray 4 mg/0.1 mL SMARTSIG:Both Nares   ondansetron (ZOFRAN-ODT) 4 MG disintegrating tablet TAKE 1 TABLET BY MOUTH EVERY 8 HOURS AS NEEDED   oxyCODONE (OXY IR/ROXICODONE) 5 MG immediate release tablet Take 1 tablet (5 mg total) by mouth every 4 (four) hours as needed for severe pain.   pramipexole (MIRAPEX) 0.5 MG tablet TAKE ONE TABLET BY MOUTH THREE TIMES A DAY   tiZANidine (ZANAFLEX) 2 MG tablet Take 2 mg by mouth 2 (two)  times daily.   Vitamin D, Ergocalciferol, (DRISDOL) 1.25 MG (50000 UNIT) CAPS capsule Take 1 capsule (50,000 Units total) by mouth every 7 (seven) days.   No facility-administered encounter medications on file as of 01/16/2022.    Social History: Social History   Tobacco Use   Smoking status: Every Day    Packs/day: 0.75    Years: 32.00    Total pack years: 24.00    Types: Cigarettes   Smokeless tobacco: Never   Tobacco comments:    0.5PPD 08/14/2021  Vaping Use   Vaping Use: Never used  Substance Use Topics   Alcohol use: Yes    Comment: occassionally   Drug use: No    Family Medical History: Family History  Problem Relation Age of Onset   Neuropathy Mother    Arthritis Father    Hypertension Father    Leukemia Father    Heart disease Father        CAD s/p CABG   Healthy Brother    Healthy Daughter    Healthy Son  Dementia Maternal Grandmother    Hypertension Maternal Grandmother    Hypercholesterolemia Maternal Grandmother    Heart disease Maternal Grandmother    Healthy Brother    Healthy Brother    Stroke Neg Hx    Cancer Neg Hx     Physical Examination: '@VITALWITHPAIN'$ @  General: Patient is well developed, well nourished, calm, collected, and in no apparent distress. Attention to examination is appropriate.  Psychiatric: Patient is non-anxious.  Head:  Pupils equal, round, and reactive to light.  ENT:  Oral mucosa appears well hydrated.  Neck:   Supple.   Respiratory: Patient is breathing without any difficulty.  Extremities: No edema.  Vascular: Palpable dorsal pedal pulses.  Skin:   On exposed skin, there are no abnormal skin lesions.  NEUROLOGICAL:     Awake, alert, oriented to person, place, and time.  Speech is clear and fluent. Fund of knowledge is appropriate.   Cranial Nerves: Pupils equal round and reactive to light.  Facial tone is symmetric.  Facial sensation is symmetric.  ROM of spine: full.  Palpation of spine: TTP over right  paraspinal muscles, SI joint, and gluteal muscles  Strength: Side Biceps Triceps Deltoid Interossei Grip Wrist Ext. Wrist Flex.  R '5 5 5 5 5 5 5  '$ L '5 5 5 5 5 5 5   '$ Side Iliopsoas Quads Hamstring PF DF EHL  R '5 5 5 5 5 5  '$ L '5 5 5 5 5 5   '$ Reflexes are 1+ and symmetric at the biceps, triceps, brachioradialis, patella and achilles.   Hoffman's is absent.  Clonus is not present.  Toes are down-going.  Bilateral upper and lower extremity sensation is intact to light touch.    Ambulates with an antalgic gait  Medical Decision Making  Imaging: 08/01/21 MRI L spine  L5-S1:Mild facet degenerative changes.No spinal canal or neural foraminal stenosis.   IMPRESSION: Mild degenerative changes of the lumbar spine with mild spinal canal stenosis and mild bilateral neural foraminal narrowing at L4-5.     Electronically Signed   By: Pedro Earls M.D.   On: 08/01/2021 17:13    07/27/14 MRI L spine FINDINGS:  No evidence for disc degeneration, disc herniation, vertebral body  abnormality, or paraspinous mass. Normal alignment and segmentation.  Normal conus. Mild lower lumbar facet arthropathy. No paravertebral  masses.    IMPRESSION:  No lumbar disc protrusion, spinal stenosis, or neural impingement.      Electronically Signed    By: Rolla Flatten M.D.    On: 07/27/2014 16:14   I have personally reviewed the images and agree with the above interpretation.  Assessment and Plan: Ms. Janet Murray is a pleasant 51 y.o. female with chronic back pain and about a year of right radiating leg pain.  Her physical exam is normal and her MRI does show some mild facet arthropathy at L5-S1 however there is no significant canal or foraminal stenosis.  We discussed this in detail and I expressed I do not feel like she would benefit from a neurosurgical intervention.  I did recommend seeking further evaluation with an EMG however she would like to defer this at this time.  We also discussed  additional treatment options including SI joint, facet, and piriformis injections and she is interested in trying this.  Her current pain provider is no longer doing injections.  I will refer her to [facility] pain management for this.  I will otherwise see her going forward on an as-needed basis.  She expressed understanding was in agreement with this plan.    Thank you for involving me in the care of this patient.   I spent a total of 35 minutes in both face-to-face and non-face-to-face activities for this visit on the date of this encounter including .   [facility]

## 2022-01-21 DIAGNOSIS — M5481 Occipital neuralgia: Secondary | ICD-10-CM | POA: Diagnosis not present

## 2022-01-21 DIAGNOSIS — M259 Joint disorder, unspecified: Secondary | ICD-10-CM | POA: Diagnosis not present

## 2022-01-21 DIAGNOSIS — M9904 Segmental and somatic dysfunction of sacral region: Secondary | ICD-10-CM | POA: Diagnosis not present

## 2022-01-21 DIAGNOSIS — G894 Chronic pain syndrome: Secondary | ICD-10-CM | POA: Diagnosis not present

## 2022-01-21 DIAGNOSIS — M5136 Other intervertebral disc degeneration, lumbar region: Secondary | ICD-10-CM | POA: Diagnosis not present

## 2022-01-31 DIAGNOSIS — M5136 Other intervertebral disc degeneration, lumbar region: Secondary | ICD-10-CM | POA: Diagnosis not present

## 2022-01-31 DIAGNOSIS — M179 Osteoarthritis of knee, unspecified: Secondary | ICD-10-CM | POA: Diagnosis not present

## 2022-02-18 DIAGNOSIS — M9904 Segmental and somatic dysfunction of sacral region: Secondary | ICD-10-CM | POA: Diagnosis not present

## 2022-02-18 DIAGNOSIS — G894 Chronic pain syndrome: Secondary | ICD-10-CM | POA: Diagnosis not present

## 2022-02-18 DIAGNOSIS — M5416 Radiculopathy, lumbar region: Secondary | ICD-10-CM | POA: Diagnosis not present

## 2022-02-18 DIAGNOSIS — M259 Joint disorder, unspecified: Secondary | ICD-10-CM | POA: Diagnosis not present

## 2022-02-18 DIAGNOSIS — M5481 Occipital neuralgia: Secondary | ICD-10-CM | POA: Diagnosis not present

## 2022-02-20 ENCOUNTER — Telehealth: Payer: Self-pay

## 2022-02-20 DIAGNOSIS — M47816 Spondylosis without myelopathy or radiculopathy, lumbar region: Secondary | ICD-10-CM

## 2022-02-20 NOTE — Telephone Encounter (Signed)
Left detailed VM for patient.

## 2022-02-20 NOTE — Telephone Encounter (Signed)
Called patient to schedule AWV. Patient states she sees Dr. Primus Bravo for pain medication but he is retiring. Patient is wanting to have a referral sent to another facility for pain management.

## 2022-02-26 ENCOUNTER — Telehealth: Payer: Self-pay

## 2022-02-26 DIAGNOSIS — G8929 Other chronic pain: Secondary | ICD-10-CM

## 2022-02-26 NOTE — Telephone Encounter (Signed)
-----   Message from Peggyann Shoals sent at 02/25/2022 10:33 AM EDT ----- Regarding: back pain Contact: [phone] Patient seen Janet Murray on 01/16/2022, her back is out and is not seeing Dr.Lateef until 03/27/22. She call them to try and get in sooner and was told that it was a new patient appt so she would not be prescribed anything at that visit. She is taking oxycodone '5mg'$ , tizanidine '2mg'$ ,  and using diclofenac gel along with heat therapy. Nothing is touching her back pain. How can you help until she is seen at the pain clinic? Onward

## 2022-02-26 NOTE — Telephone Encounter (Signed)
We can try a medrol dose pack. Has she done this before? Let me know and I can send it in.

## 2022-02-26 NOTE — Telephone Encounter (Signed)
Can you ask her if she would like to try some prednisone

## 2022-02-26 NOTE — Telephone Encounter (Signed)
Left message to call back  

## 2022-02-27 MED ORDER — METHYLPREDNISOLONE 4 MG PO TBPK: ORAL_TABLET | ORAL | 0 refills | Status: DC

## 2022-02-27 NOTE — Telephone Encounter (Signed)
She is willing to try prednisone, send that to her pharmacy.

## 2022-02-27 NOTE — Addendum Note (Signed)
Addended byGeronimo Boot on: 02/27/2022 12:04 PM   Modules accepted: Orders

## 2022-02-27 NOTE — Telephone Encounter (Signed)
Medrol dose pack sent in. Please let her know.   Thanks.

## 2022-02-27 NOTE — Telephone Encounter (Signed)
Patient notified via voicemail.

## 2022-02-27 NOTE — Telephone Encounter (Signed)
Please send this in 

## 2022-03-02 DIAGNOSIS — M179 Osteoarthritis of knee, unspecified: Secondary | ICD-10-CM | POA: Diagnosis not present

## 2022-03-02 DIAGNOSIS — M5136 Other intervertebral disc degeneration, lumbar region: Secondary | ICD-10-CM | POA: Diagnosis not present

## 2022-03-06 ENCOUNTER — Ambulatory Visit (INDEPENDENT_AMBULATORY_CARE_PROVIDER_SITE_OTHER): Payer: Medicare Other

## 2022-03-06 DIAGNOSIS — Z0000 Encounter for general adult medical examination without abnormal findings: Secondary | ICD-10-CM

## 2022-03-06 NOTE — Patient Instructions (Signed)

## 2022-03-06 NOTE — Progress Notes (Signed)
Patient no showed for appointment

## 2022-03-18 DIAGNOSIS — M7600 Gluteal tendinitis, unspecified hip: Secondary | ICD-10-CM | POA: Diagnosis not present

## 2022-03-18 DIAGNOSIS — M259 Joint disorder, unspecified: Secondary | ICD-10-CM | POA: Diagnosis not present

## 2022-03-18 DIAGNOSIS — M5481 Occipital neuralgia: Secondary | ICD-10-CM | POA: Diagnosis not present

## 2022-03-18 DIAGNOSIS — G894 Chronic pain syndrome: Secondary | ICD-10-CM | POA: Diagnosis not present

## 2022-03-18 DIAGNOSIS — M5136 Other intervertebral disc degeneration, lumbar region: Secondary | ICD-10-CM | POA: Diagnosis not present

## 2022-03-18 DIAGNOSIS — M9904 Segmental and somatic dysfunction of sacral region: Secondary | ICD-10-CM | POA: Diagnosis not present

## 2022-03-27 ENCOUNTER — Encounter: Payer: Self-pay | Admitting: Student in an Organized Health Care Education/Training Program

## 2022-03-27 ENCOUNTER — Ambulatory Visit
Payer: Medicare Other | Attending: Student in an Organized Health Care Education/Training Program | Admitting: Student in an Organized Health Care Education/Training Program

## 2022-03-27 VITALS — BP 124/82 | HR 81 | Temp 98.1°F | Resp 18 | Ht 61.0 in | Wt 195.5 lb

## 2022-03-27 DIAGNOSIS — M48061 Spinal stenosis, lumbar region without neurogenic claudication: Secondary | ICD-10-CM | POA: Diagnosis not present

## 2022-03-27 DIAGNOSIS — M48062 Spinal stenosis, lumbar region with neurogenic claudication: Secondary | ICD-10-CM | POA: Insufficient documentation

## 2022-03-27 DIAGNOSIS — M47816 Spondylosis without myelopathy or radiculopathy, lumbar region: Secondary | ICD-10-CM | POA: Insufficient documentation

## 2022-03-27 DIAGNOSIS — M533 Sacrococcygeal disorders, not elsewhere classified: Secondary | ICD-10-CM | POA: Insufficient documentation

## 2022-03-27 DIAGNOSIS — G894 Chronic pain syndrome: Secondary | ICD-10-CM | POA: Insufficient documentation

## 2022-03-27 DIAGNOSIS — G8929 Other chronic pain: Secondary | ICD-10-CM | POA: Diagnosis not present

## 2022-03-27 NOTE — Progress Notes (Signed)
Patient: Janet Murray  Service Category: E/M  Provider: Gillis Santa, MD  DOB: 10-22-1970  DOS: 03/27/2022  Referring Provider: Loleta Dicker, PA  MRN: [id number]  Setting: Ambulatory outpatient  PCP: Jon Billings, NP  Type: New Patient  Specialty: Interventional Pain Management    Location: Office  Delivery: Face-to-face     Primary Reason(s) for Visit: Encounter for initial evaluation of one or more chronic problems (new to examiner) potentially causing chronic pain, and posing a threat to normal musculoskeletal function. (Level of risk: High) CC: Back Pain (lower)  HPI  Janet Murray is a 51 y.o. year old, female patient, who comes for the first time to our practice referred by Loleta Dicker, PA for our initial evaluation of her chronic pain. She has Osteoarthritis of spine with radiculopathy, lumbar region; Lumbar facet arthropathy; Sacroiliac joint dysfunction of both sides; Carpal tunnel syndrome of left wrist; Occipital neuralgia; Restless leg syndrome; Other intervertebral disc degeneration, lumbar region; Hereditary and idiopathic neuropathy; Lumbar radiculopathy; Migraine; Abdominal pain, RUQ (right upper quadrant); Chronic bronchitis, obstructive; Chronic nausea; Cyst of right kidney; Diarrhea of infectious origin; Fatty liver; Gastric dysplasia; History of alcohol use; Other dysphagia; Other irritable bowel syndrome; Other spondylosis with radiculopathy, lumbar region; Obesity; Urinary incontinence; Acquired hypothyroidism; Panic attack; COPD (chronic obstructive pulmonary disease) with emphysema ([place]); Tobacco abuse; Low back pain; Thrombocytopenia ([place]); Radiculopathy of lumbar region; Sacrococcygeal disorders, not elsewhere classified; Chronic right SI joint pain; Trochanteric bursitis of right hip; Special screening for malignant neoplasms, colon; Polyp of colon; Coronary artery disease; High cholesterol; Hypocalcemia; 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 Today she comes in for evaluation of her Back Pain (lower)  Pain Assessment: Location: Lower Back Radiating: bilateral hips, down side of right leg to foot, including right toes Onset: More than a month ago Duration: Chronic pain Quality: Aching, Burning, Constant, Sharp, Shooting Severity: 7 /10 (subjective, self-reported pain score)  Effect on ADL: "I cannot get out of bed sometimes" Timing: Constant Modifying factors: heat, cold, pain meds BP: 124/82  HR: 81  Onset and Duration: Gradual Cause of pain: Unknown Severity: Getting worse and No change since onset Timing: Not influenced by the time of the day Aggravating Factors: Bowel movements, Kneeling, Lifiting, Motion, Prolonged standing, Stooping , Twisting, Walking, Walking uphill, and Walking downhill Alleviating Factors: Stretching, Cold packs, Hot packs, Lying down, Medications, Resting, Sitting, Sleeping, and Warm showers or baths Associated Problems: Nausea, Numbness, Spasms, Swelling, Tingling, Weakness, and Pain that wakes patient up Quality of Pain: Aching, Agonizing, Nagging, Pressure-like, Sharp, Stabbing, Tender, Throbbing, Tingling, and Uncomfortable Previous Examinations or Tests: MRI scan and X-rays Previous Treatments: The patient denies treatment  Janet Murray is a pleasant 51 year old female who presents with a chief complaint of low back pain with occasional radiation into bilateral legs, right greater than left.  She has done physical therapy on many occasions in the past.  She is also tried to do home physical therapy exercises that she has learned.  She states that these do not help manage her pain.  She states that she has difficulty standing up straight and walking given her pain.  She has to bend forward to be somewhat  comfortable while she is working and walking.  She has been evaluated by neurosurgery.  Surgery was not recommended.  She has been referred  here to consider injection therapy.  I was very clear with the patient that we will be focusing primarily on interventional pain management.  I am no longer taking patients specifically for medication management at this time.  Janet Murray was informed that I continue to offer evaluations and recommendations for medication management but I no longer take patients to write for their medications. I informed her that this visit is an evaluation only and that on the follow up appointment I will go over the my review of the case, the results of available tests, and assuming that there are no contraindications, we will provide her with information about possible interventional pain management options. At that time she will have the opportunity to decide whether or not to proceed with those therapies. In the event that Janet Murray decides not to go with those options, or prefers to stay away from interventional therapies, this will conclude our involvement in the case.   Meds   Current Outpatient Medications:    albuterol (PROVENTIL) (2.5 MG/3ML) 0.083% nebulizer solution, Take 3 mLs (2.5 mg total) by nebulization every 6 (six) hours as needed for wheezing or shortness of breath., Disp: 75 mL, Rfl: 12   albuterol (VENTOLIN HFA) 108 (90 Base) MCG/ACT inhaler, Inhale 2 puffs into the lungs every 6 (six) hours as needed for wheezing or shortness of breath., Disp: 18 g, Rfl: 4   AMBULATORY NON FORMULARY MEDICATION, Medication Name: nebulizer DX: J44.9, Disp: 1 each, Rfl: 0   atorvastatin (LIPITOR) 80 MG tablet, Take 1 tablet (80 mg total) by mouth daily., Disp: 90 tablet, Rfl: 3   diclofenac Sodium (VOLTAREN) 1 % GEL, Apply 1 application topically daily as needed (Knee pain)., Disp: , Rfl:    esomeprazole (NEXIUM) 40 MG capsule, TAKE ONE CAPSULE BY MOUTH TWICE A DAY, Disp: 180  capsule, Rfl: 2   Glycopyrrolate-Formoterol (BEVESPI AEROSPHERE) 9-4.8 MCG/ACT AERO, INHALE 2 PUFFS BY MOUTH INTO THE LUNGS 2TIMES DAILY, Disp: 10.7 g, Rfl: 11   hydrOXYzine (ATARAX/VISTARIL) 25 MG tablet, Take 1 tablet (25 mg total) by mouth 3 (three) times daily as needed. (Patient taking differently: Take 25 mg by mouth 3 (three) times daily as needed for anxiety.), Disp: 30 tablet, Rfl: 5   levothyroxine (SYNTHROID) 175 MCG tablet, TAKE 1 TABLET BY MOUTH DAILY, Disp: 90 tablet, Rfl: 2   liraglutide (VICTOZA) 18 MG/3ML SOPN, Inject 1.2 mg into the skin daily., Disp: 9 mL, Rfl: 1   montelukast (SINGULAIR) 10 MG tablet, TAKE 1 TABLET BY MOUTH AT BEDTIME, Disp: 90 tablet, Rfl: 2   naloxone (NARCAN) nasal spray 4 mg/0.1 mL, SMARTSIG:Both Nares, Disp: , Rfl:    ondansetron (ZOFRAN-ODT) 4 MG disintegrating tablet, TAKE 1 TABLET BY MOUTH EVERY 8 HOURS AS NEEDED, Disp: 30 tablet, Rfl: 0   oxyCODONE (OXY IR/ROXICODONE) 5 MG immediate release tablet, Take 1 tablet (5 mg total) by mouth every 4 (four) hours as needed for severe pain., Disp: 20 tablet, Rfl: 0   pramipexole (MIRAPEX) 0.5 MG tablet, TAKE ONE TABLET BY MOUTH THREE TIMES A DAY, Disp: 360 tablet, Rfl: 1   tiZANidine (ZANAFLEX) 2 MG tablet, Take 2 mg by mouth 2 (two) times daily., Disp: , Rfl:    Vitamin D, Ergocalciferol, (DRISDOL) 1.25 MG (50000 UNIT) CAPS capsule, Take 1 capsule (50,000 Units total) by mouth every 7 (seven) days., Disp: 12 capsule, Rfl: 1   methylPREDNISolone (MEDROL DOSEPAK) 4 MG TBPK tablet, Use as directed x 6 days. (Patient not taking: Reported on 03/27/2022), Disp:  1 each, Rfl: 0  Imaging Review  MR Lumbar Spine Wo Contrast  Narrative CLINICAL DATA:  Osteoarthritis of spine with radiculopathy, lumbar region M47.26 (ICD-10-CM). Other spondylosis with radiculopathy, lumbar region M47.26 (ICD-10-CM). Radiculopathy of lumbar region M54.16 (ICD-10-CM). Other intervertebral disc degeneration, lumbar region M51.36  (ICD-10-CM).  EXAM: MRI LUMBAR SPINE WITHOUT CONTRAST  TECHNIQUE: Multiplanar, multisequence MR imaging of the lumbar spine was performed. No intravenous contrast was administered.  COMPARISON:  MRI 07/27/2021.  FINDINGS: Segmentation:  Standard.  Alignment:  Physiologic.  Vertebrae:  No fracture, evidence of discitis, or bone lesion.  Conus medullaris and cauda equina: Conus extends to the L1 level. Conus and cauda equina appear normal.  Paraspinal and other soft tissues: Negative.  Disc levels:  T12-L1:No spinal canal or neural foraminal stenosis.  L1-2:No spinal canal or neural foraminal stenosis.  L2-3:No spinal canal or neural foraminal stenosis.  L3-4:No spinal canal or neural foraminal stenosis.  L4-5:Shallow disc bulge, mild facet degenerative changes and mild ligament flavum redundancy resulting in mild spinal canal stenosis and mild bilateral neural foraminal narrowing.  L5-S1:Mild facet degenerative changes.No spinal canal or neural foraminal stenosis.  IMPRESSION: Mild degenerative changes of the lumbar spine with mild spinal canal stenosis and mild bilateral neural foraminal narrowing at L4-5.   Electronically Signed By: Pedro Earls M.D. On: 08/01/2021 17:13   Narrative Clinical Data: Chronic low back pain radiating down both legs. Leukocytosis. MRI LUMBAR SPINE WITHOUT AND WITH CONTRAST: Technique: Multiplanar and multiecho pulse sequences of the lumbar spine, to include the lower thoracic region and upper sacral regions, were obtained according to standard protocol before and after administration of intravenous contrast. Contrast: 15 cc Magnevist. Findings: Alignment of the spine is normal. Intervertebral disks are normal in signal characteristics and morphology. The spinal canal and foraminal are all widely patent. No evidence of osseous or articular pathology. After contrast administration, there is no abnormal  enhancement.  Impression 1. Negative MRI of the spine. No cause of pain identified.  Provider: Charlett Lango DG Lumbar Spine Complete  Narrative CLINICAL DATA:  Osteoarthritis of spine with radiculopathy, lumbar region. Other spondylosis with radiculopathy. Radiculopathy of lumbar region. Other intervertebral disc degeneration, lumbar region.  Chronic lower back pain, right leg pain with tingling and burning.  EXAM: LUMBAR SPINE - COMPLETE 4+ VIEW  COMPARISON:  MRI 07/27/2014. reformats from abdominopelvic CT 10/30/2020  FINDINGS: There are 5 non-rib-bearing lumbar vertebra with diminutive ribs at T12. Normal lumbar alignment. Normal vertebral body heights. Normal intervertebral disc spaces. Trace L5-S1 facet hypertrophy. No fracture, focal bone lesion or bony destruction. The sacroiliac joints are congruent.  IMPRESSION: Trace L5-S1 facet hypertrophy.   Electronically Signed By: Keith Rake M.D. On: 07/20/2021 11:00   Narrative CLINICAL DATA:  Right hip pain, popping, grinding and intermittent locking for 6 months. No known injury. Subsequent encounter.  EXAM: MR OF THE RIGHT HIP WITHOUT CONTRAST  TECHNIQUE: Multiplanar, multisequence MR imaging was performed. No intravenous contrast was administered.  COMPARISON:  CT abdomen and pelvis 11/30/2014. MRI of the hips 10/06/2013.  FINDINGS: Bones: Marrow signal is normal in the femoral heads bilaterally without fracture, stress change or avascular necrosis. Mild marrow edema about the sacroiliac joints is consistent with degenerative change. Bone marrow signal is otherwise unremarkable.  Articular cartilage and labrum  Articular cartilage:  Appears normal.  Labrum:  Intact.  Joint or bursal effusion  Joint effusion:  None.  Bursae:  Unremarkable.  Muscles and tendons  Muscles and tendons:  Intact.  Other findings  Miscellaneous: Imaged intrapelvic contents demonstrate  no abnormality.  IMPRESSION: Mild degenerative disease about the sacroiliac joints appears symmetric from right to left. The examination is otherwise negative.   Electronically Signed By: Inge Rise M.D. On: 01/09/2015 09:51  DG Knee 2 Views Right  Narrative CLINICAL DATA:  Pt states she ws just here 3 weeks ago with foot pain after a bunch of cans fell on her foot at [employer]-known fractures. Pt today was doing laundry and tripped over a crate and started having more pain in left foot and a laceration and swelling on her right knee.  EXAM: RIGHT KNEE - 1-2 VIEW  COMPARISON:  None.  FINDINGS: No fracture. No bone lesion. Joint is normally spaced and aligned. No joint effusion.  Mild anterior subcutaneous soft tissue edema. No radiopaque foreign body.  IMPRESSION: 1. No fracture. No knee joint abnormality. No radiopaque foreign body.   Electronically Signed By: Lajean Manes M.D. On: 09/02/2015 17:09   Narrative CLINICAL DATA:  Bilateral knee pain. Worse with bearing weight. Remote injury to the right knee.  EXAM: RIGHT KNEE - COMPLETE 4+ VIEW  COMPARISON:  Two views of the right knee 09/02/2015  FINDINGS: No evidence of fracture, dislocation, or joint effusion. No evidence of arthropathy or other focal bone abnormality. Soft tissues are unremarkable.  IMPRESSION: Negative.   Electronically Signed By: San Morelle M.D. On: 11/03/2017 09:24   Narrative CLINICAL DATA:  Bilateral knee pain. Pain is worse with walking and bearing weight. Remote injury of the right knee.  EXAM: LEFT KNEE - COMPLETE 4+ VIEW  COMPARISON:  Left knee radiographs 06/14/2007  FINDINGS: No evidence of fracture, dislocation, or joint effusion. No evidence of arthropathy or other focal bone abnormality. Soft tissues are unremarkable.  IMPRESSION: Negative.   Electronically Signed By: San Morelle M.D. On: 11/03/2017 09:23    Foot-L DG  Complete: Results for orders placed during the hospital encounter of 09/02/15  DG Foot Complete Left  Narrative CLINICAL DATA:  History of left foot fractures three weeks ago. The patient tripped today with onset of left foot pain with a laceration. Initial encounter.  EXAM: LEFT FOOT - COMPLETE 3+ VIEW  COMPARISON:  Plain films left foot 08/12/2015 05/02/2005.  FINDINGS: Remote healed fourth and fifth metatarsal fractures are unchanged in appearance. There is no acute bony or joint abnormality. Bones are osteopenic. Soft tissues are unremarkable.  IMPRESSION: No acute abnormality.  Remote healed fourth and fifth metatarsal fractures.  Osteopenia.   Electronically Signed By: Inge Rise M.D. On: 09/02/2015 17:10  Narrative CLINICAL DATA:  Slipped and fell in yard. Left wrist pain and swelling.  EXAM: LEFT WRIST - COMPLETE 3+ VIEW  COMPARISON:  05/29/2011  FINDINGS: Subtle nondisplaced fracture is seen involving the ulnar aspect of the radial metaphysis. Other fractures identified. Evidence of dislocation.  IMPRESSION: Subtle nondisplaced fracture of radial metaphysis.   Electronically Signed By: Marlaine Hind M.D.   Complexity Note: Imaging results reviewed.                         ROS  Cardiovascular: No reported cardiovascular signs or symptoms such as High blood pressure, coronary artery disease, abnormal heart rate or rhythm, heart attack, blood thinner therapy or heart weakness and/or failure Pulmonary or Respiratory: Wheezing and difficulty taking a deep full breath (Asthma), Shortness of breath, Smoking, and Coughing up mucus (Bronchitis) Neurological: No reported neurological signs or symptoms such as seizures,  abnormal skin sensations, urinary and/or fecal incontinence, being born with an abnormal open spine and/or a tethered spinal cord Psychological-Psychiatric: Anxiousness and Prone to panicking Gastrointestinal: Reflux or heatburn,  Alternating episodes iof diarrhea and constipation (IBS-Irritable bowe syndrome), and Irregular, infrequent bowel movements (Constipation) Genitourinary: Passing kidney stones Hematological: No reported hematological signs or symptoms such as prolonged bleeding, low or poor functioning platelets, bruising or bleeding easily, hereditary bleeding problems, low energy levels due to low hemoglobin or being anemic Endocrine: Slow thyroid Rheumatologic: Rheumatoid arthritis Musculoskeletal: Negative for myasthenia gravis, muscular dystrophy, multiple sclerosis or malignant hyperthermia Work History: Disabled  Allergies  Ms. Worland is allergic to penicillins, acetaminophen, ciprofloxacin, aspirin, hydrocodone, and tape.  Laboratory Chemistry Profile   Renal Lab Results  Component Value Date   BUN 6 12/27/2021   CREATININE 0.57 12/27/2021   BCR 11 12/27/2021   GFRAA 127 01/19/2020   GFRNONAA >60 04/29/2021   SPECGRAV 1.015 01/19/2020   PHUR 6.5 01/19/2020   PROTEINUR NEGATIVE 02/08/2020     Electrolytes Lab Results  Component Value Date   NA 141 12/27/2021   K 4.0 12/27/2021   CL 103 12/27/2021   CALCIUM 9.5 12/27/2021     Hepatic Lab Results  Component Value Date   AST 12 12/27/2021   ALT 8 12/27/2021   ALBUMIN 4.5 12/27/2021   ALKPHOS 100 12/27/2021   LIPASE 15 01/19/2020     ID Lab Results  Component Value Date   HIV Non Reactive 04/12/2021   SARSCOV2NAA NEGATIVE 04/10/2021   PREGTESTUR  11/17/2006    NEGATIVE        THE SENSITIVITY OF THIS METHODOLOGY IS >24 mIU/mL     Bone Lab Results  Component Value Date   VD25OH 10.1 (L) 12/27/2021     Endocrine Lab Results  Component Value Date   GLUCOSE 116 (H) 12/27/2021   GLUCOSEU NEGATIVE 02/08/2020   TSH 1.25 10/29/2021   FREET4 0.97 10/29/2021     Neuropathy Lab Results  Component Value Date   HIV Non Reactive 04/12/2021     CNS No results found for: "COLORCSF", "APPEARCSF", "RBCCOUNTCSF", "WBCCSF",  "POLYSCSF", "LYMPHSCSF", "EOSCSF", "PROTEINCSF", "GLUCCSF", "JCVIRUS", "CSFOLI", "IGGCSF", "LABACHR", "ACETBL"   Inflammation (CRP: Acute  ESR: Chronic) No results found for: "CRP", "ESRSEDRATE", "LATICACIDVEN"   Rheumatology No results found for: "RF", "ANA", "LABURIC", "URICUR", "LYMEIGGIGMAB", "LYMEABIGMQN", "HLAB27"   Coagulation Lab Results  Component Value Date   PLT CANCELED 12/27/2021   LABHEMA Note: 12/27/2021     Cardiovascular Lab Results  Component Value Date   BNP 20 12/16/2013   TROPONINI <0.03 06/22/2017   HGB 13.8 12/27/2021   HCT 39.4 12/27/2021     Screening Lab Results  Component Value Date   SARSCOV2NAA NEGATIVE 04/10/2021   HIV Non Reactive 04/12/2021   PREGTESTUR  11/17/2006    NEGATIVE        THE SENSITIVITY OF THIS METHODOLOGY IS >24 mIU/mL     Cancer No results found for: "CEA", "CA125", "LABCA2"   Allergens No results found for: "ALMOND", "APPLE", "ASPARAGUS", "AVOCADO", "BANANA", "BARLEY", "BASIL", "BAYLEAF", "GREENBEAN", "LIMABEAN", "WHITEBEAN", "BEEFIGE", "REDBEET", "BLUEBERRY", "BROCCOLI", "CABBAGE", "MELON", "CARROT", "CASEIN", "CASHEWNUT", "CAULIFLOWER", "CELERY"     Note: Lab results reviewed.  [place]  Drug: Ms. Gable  reports no history of drug use. Alcohol:  reports current alcohol use. Tobacco:  reports that she has been smoking cigarettes. She has a 24.00 pack-year smoking history. She has never used smokeless tobacco. Medical:  has a past medical history of Abdominal mass, RUQ (  right upper quadrant), Anxiety, Arthritis, Asthma, Bronchitis, Cancer ([place]) (04/12/2021), Carpal tunnel syndrome of left wrist (10/25/2014), Chronic bronchitis, obstructive (07/20/2017), Chronic pain, COPD (chronic obstructive pulmonary disease) ([place]), Coronary artery disease, Cyst of right kidney (03/22/2018), DDD (degenerative disc disease), lumbar (10/25/2014), Degenerative joint disease (DJD) of hip, Diarrhea of infectious origin (03/25/2018), Dysphagia,  Dyspnea, Emphysema of lung ([place]), Facet syndrome, lumbar (10/25/2014), Fatty liver (03/25/2018), Foot fracture, left (05/02/2015), 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 Family: family history includes Arthritis in her father; Dementia in her maternal grandmother; Healthy in her brother, brother, brother, daughter, and son; Heart disease in her father and maternal grandmother; Hypercholesterolemia in her maternal grandmother; Hypertension in her father and maternal grandmother; Leukemia in her father; Neuropathy in her mother.  Past Surgical History:  Procedure Laterality Date   APPENDECTOMY     BLADDER SURGERY     sling - Dr. Davis Gourd   CESAREAN SECTION     COLONOSCOPY WITH PROPOFOL N/A 04/11/2020   Procedure: COLONOSCOPY WITH PROPOFOL;  Surgeon: Virgel Manifold, MD;  Location: [facility] ENDOSCOPY;  Service: Endoscopy;  Laterality: N/A;   COLONOSCOPY WITH PROPOFOL N/A 10/22/2021   Procedure: COLONOSCOPY WITH PROPOFOL;  Surgeon: Lin Landsman, MD;  Location: [facility] ENDOSCOPY;  Service: Gastroenterology;  Laterality: N/A;   ESOPHAGOGASTRODUODENOSCOPY  04/26/2019   STOMACH SURGERY     THYROIDECTOMY N/A 04/12/2021   Procedure: THYROIDECTOMY, total;  Surgeon: Jules Husbands, MD;  Location: [facility] ORS;  Service: General;  Laterality: N/A;   TOTAL ABDOMINAL HYSTERECTOMY W/ BILATERAL SALPINGOOPHORECTOMY     Active Ambulatory  Problems    Diagnosis Date Noted   Osteoarthritis of spine with radiculopathy, lumbar region 10/25/2014   Lumbar facet arthropathy 10/25/2014   Sacroiliac joint dysfunction of both sides 10/25/2014   Carpal tunnel syndrome of left wrist 10/25/2014   Occipital neuralgia 10/25/2014   Restless leg syndrome 10/25/2014   Other intervertebral disc degeneration, lumbar region 10/25/2014   Hereditary and idiopathic neuropathy 11/06/2014   Lumbar radiculopathy 11/08/2014   Migraine 02/04/2016   Abdominal pain, RUQ (right upper quadrant) 10/10/2017   Chronic bronchitis, obstructive 07/20/2017   Chronic nausea 02/19/2018   Cyst of right kidney 03/22/2018   Diarrhea of infectious origin 03/25/2018   Fatty liver 03/25/2018   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   History of alcohol use 03/25/2018   Other dysphagia 04/20/2017   Other irritable bowel syndrome 04/20/2017   Other spondylosis with radiculopathy, lumbar region 10/25/2014   Obesity 10/26/2019   Urinary incontinence    Acquired hypothyroidism 11/01/2019   Panic attack 11/01/2019   COPD (chronic obstructive pulmonary disease) with emphysema ([place]) 12/13/2019   Tobacco abuse 12/13/2019   Low back pain 10/25/2014   Thrombocytopenia ([place]) 02/19/2018   Radiculopathy of lumbar region 11/08/2014   Sacrococcygeal disorders, not elsewhere classified 10/25/2014   Chronic right SI joint pain 04/02/2020   Trochanteric bursitis of right hip 04/02/2020   Special screening for malignant neoplasms, colon    Polyp of colon    Coronary artery disease 11/09/2020   High cholesterol 01/09/2021   Hypocalcemia 04/02/8526   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[place]) 07/31/2021   Atherosclerosis of  aorta ([place]) 09/27/2021   History of colonic polyps    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) 03/27/2022   Neuroforaminal stenosis of lumbar spine (L4/5) 03/27/2022   Chronic pain syndrome 03/27/2022   Resolved Ambulatory Problems     Diagnosis Date Noted   No Resolved Ambulatory Problems   Past Medical History:  Diagnosis Date   Abdominal mass, RUQ (right upper quadrant)    Anxiety    Arthritis    Asthma    Bronchitis    Cancer ([place]) 04/12/2021   Chronic pain    COPD (chronic obstructive pulmonary disease) ([facility])    DDD (degenerative disc disease), lumbar 10/25/2014   Degenerative joint disease (DJD) of hip    Dysphagia    Dyspnea    Emphysema of lung ([facility])    Facet syndrome, lumbar 10/25/2014   Foot fracture, left 05/02/2015   GERD (gastroesophageal reflux disease)    Hereditary and idiopathic peripheral neuropathy 11/06/2014   Hypothyroidism    IBS (irritable bowel syndrome)    RLS (restless legs syndrome)    Spondylosis of lumbar spine    Constitutional Exam  General appearance: Well nourished, well developed, and well hydrated. In no apparent acute distress Vitals:   03/27/22 1248  BP: 124/82  Pulse: 81  Resp: 18  Temp: 98.1 F (36.7 C)  TempSrc: Temporal  SpO2: 97%  Weight: 195 lb 8 oz (88.7 kg)  Height: '5\' 1"'  (1.549 m)   BMI Assessment: Estimated body mass index is 36.94 kg/m as calculated from the following:   Height as of this encounter: '5\' 1"'  (1.549 m).   Weight as of this encounter: 195 lb 8 oz (88.7 kg).  BMI interpretation table: BMI level Category Range association with higher incidence of chronic pain  <18 kg/m2 Underweight   18.5-24.9 kg/m2 Ideal body weight   25-29.9 kg/m2 Overweight Increased incidence by 20%  30-34.9 kg/m2 Obese (Class I) Increased incidence by 68%  35-39.9 kg/m2 Severe obesity (Class II) Increased incidence by 136%  >40 kg/m2 Extreme obesity (Class III) Increased incidence by 254%   Patient's current BMI Ideal Body weight  Body mass index is 36.94 kg/m. Ideal body weight: 47.8 kg (105 lb 6.1 oz) Adjusted ideal body weight: 64.1 kg (141 lb 6.8 oz)   BMI Readings from Last 4 Encounters:  03/27/22 36.94 kg/m  01/16/22 34.51 kg/m  12/27/21 33.89 kg/m   12/13/21 34.19 kg/m   Wt Readings from Last 4 Encounters:  03/27/22 195 lb 8 oz (88.7 kg)  01/16/22 194 lb 12.8 oz (88.4 kg)  12/27/21 191 lb 4.8 oz (86.8 kg)  12/13/21 193 lb (87.5 kg)    Psych/Mental status: Alert, oriented x 3 (person, place, & time)       Eyes: PERLA Respiratory: No evidence of acute respiratory distress  Thoracic Spine Area Exam  Skin & Axial Inspection: No masses, redness, or swelling Alignment: Symmetrical Functional ROM: Pain restricted ROM Stability: No instability detected Muscle Tone/Strength: Functionally intact. No obvious neuro-muscular anomalies detected. Sensory (Neurological): Unimpaired Muscle strength & Tone: No palpable anomalies Lumbar Spine Area Exam  Skin & Axial Inspection: No masses, redness, or swelling Alignment: Symmetrical Functional ROM: Pain restricted ROM       Stability: No instability detected Muscle Tone/Strength: Functionally intact. No obvious neuro-muscular anomalies detected. Sensory (Neurological): Dermatomal pain pattern possibly at L4/5 and MSK  Lower Extremity Exam    Side: Right lower extremity  Side: Left lower extremity  Stability: No instability observed  Stability: No instability observed          Skin & Extremity Inspection: Skin color, temperature, and hair growth are WNL. No peripheral edema or cyanosis. No masses, redness, swelling, asymmetry, or associated skin lesions. No contractures.  Skin & Extremity Inspection: Skin color, temperature, and hair growth are WNL. No peripheral edema or cyanosis. No masses, redness, swelling, asymmetry, or associated skin lesions. No contractures.  Functional ROM: Unrestricted ROM                  Functional ROM: Unrestricted ROM                  Muscle Tone/Strength: Functionally intact. No obvious neuro-muscular anomalies detected.  Muscle Tone/Strength: Functionally intact. No obvious neuro-muscular anomalies detected.  Sensory (Neurological): Neuropathic pain  pattern        Sensory (Neurological): Neuropathic pain pattern        DTR: Patellar: deferred today Achilles: deferred today Plantar: deferred today  DTR: Patellar: deferred today Achilles: deferred today Plantar: deferred today  Palpation: No palpable anomalies  Palpation: No palpable anomalies   5 out of 5 strength bilateral lower extremity: Plantar flexion, dorsiflexion, knee flexion, knee extension.   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  Visit Diagnosis (New problems to examiner): 1. 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 Lumbar facet arthropathy   3. 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 Chronic right SI joint pain   5. Chronic pain syndrome    Plan of Care     Procedure Orders         Lumbar Epidural Injection     Reviewed MRI with patient.  Clinically the patient's symptoms are consistent with lumbar radicular pain although her MRI is fairly unremarkable except at L4-L5 which shows mild facet degenerative changes, mild ligamentum flavum redundancy resulting in mild spinal canal stenosis.  The majority of her pain is low back with radiation into right leg that she describes as burning and tingling.  I offered the patient a lumbar epidural steroid injection.  Risk and benefits reviewed and patient would like to proceed.  Future considerations include facet medial branch nerve blocks, SI joint intervention.   Opioid Analgesics: We no longer take patients for opioid medication management. If requested, Ms. Ripp will be evaluated for this type of pharmacotherapy. The evaluation will assess the risks and indications of the therapy,  as they apply to this particular patient. We may provide recommendations on medication, dose, schedule, and monitoring. The prescribing physician will ultimately decide, based on his/her training and level of comfort whether to adopt any of the recommendations, including the prescribing of such medicines.  Membrane stabilizer: To be determined at a later time  Muscle relaxant: To be determined at a later time  NSAID: To be determined at a later time  Other analgesic(s): To be determined at a later time   Interventional management options: Ms. Jeter was informed that there is no guarantee that she would be a candidate for interventional therapies. The decision will be based on the results of diagnostic studies, as well as Ms. Pritchard's risk profile.  Procedure(s) under consideration:  Lumbar facet medial branch nerve blocks SI joint injection Piriformis TPI  I spent a total of 60 minutes reviewing chart data, face-to-face evaluation with the patient, counseling and coordination of care as detailed above.       Provider-requested follow-up: Return in about 2 weeks (around 04/10/2022) for Right L4/5 ESI , in clinic IV Versed.  Future Appointments  Date Time Provider [place]  04/07/2022  1:45 PM [facility] None  04/10/2022  1:00 PM Elayne Snare, MD [facility] None  06/30/2022  1:00 PM Jon Billings, NP [facility]    Note by: Gillis Santa, MD Date: 03/27/2022; Time: 2:59 PM

## 2022-03-27 NOTE — Patient Instructions (Signed)
GENERAL RISKS AND COMPLICATIONS ° °What are the risk, side effects and possible complications? °Generally speaking, most procedures are safe.  However, with any procedure there are risks, side effects, and the possibility of complications.  The risks and complications are dependent upon the sites that are lesioned, or the type of nerve block to be performed.  The closer the procedure is to the spine, the more serious the risks are.  Great care is taken when placing the radio frequency needles, block needles or lesioning probes, but sometimes complications can occur. °Infection: Any time there is an injection through the skin, there is a risk of infection.  This is why sterile conditions are used for these blocks.  There are four possible types of infection. °Localized skin infection. °Central Nervous System Infection-This can be in the form of Meningitis, which can be deadly. °Epidural Infections-This can be in the form of an epidural abscess, which can cause pressure inside of the spine, causing compression of the spinal cord with subsequent paralysis. This would require an emergency surgery to decompress, and there are no guarantees that the patient would recover from the paralysis. °Discitis-This is an infection of the intervertebral discs.  It occurs in about 1% of discography procedures.  It is difficult to treat and it may lead to surgery. ° °      2. Pain: the needles have to go through skin and soft tissues, will cause soreness. °      3. Damage to internal structures:  The nerves to be lesioned may be near blood vessels or   ° other nerves which can be potentially damaged. °      4. Bleeding: Bleeding is more common if the patient is taking blood thinners such as  aspirin, Coumadin, Ticiid, Plavix, etc., or if he/she have some genetic predisposition  such as hemophilia. Bleeding into the spinal canal can cause compression of the spinal  cord with subsequent paralysis.  This would require an emergency  surgery to  decompress and there are no guarantees that the patient would recover from the  paralysis. °      5. Pneumothorax:  Puncturing of a lung is a possibility, every time a needle is introduced in  the area of the chest or upper back.  Pneumothorax refers to free air around the  collapsed lung(s), inside of the thoracic cavity (chest cavity).  Another two possible  complications related to a similar event would include: Hemothorax and Chylothorax.   These are variations of the Pneumothorax, where instead of air around the collapsed  lung(s), you may have blood or chyle, respectively. °      6. Spinal headaches: They may occur with any procedures in the area of the spine. °      7. Persistent CSF (Cerebro-Spinal Fluid) leakage: This is a rare problem, but may occur  with prolonged intrathecal or epidural catheters either due to the formation of a fistulous  track or a dural tear. °      8. Nerve damage: By working so close to the spinal cord, there is always a possibility of  nerve damage, which could be as serious as a permanent spinal cord injury with  paralysis. °      9. Death:  Although rare, severe deadly allergic reactions known as "Anaphylactic  reaction" can occur to any of the medications used. °     10. Worsening of the symptoms:  We can always make thing worse. ° °What are the chances   of something like this happening? °Chances of any of this occuring are extremely low.  By statistics, you have more of a chance of getting killed in a motor vehicle accident: while driving to the hospital than any of the above occurring .  Nevertheless, you should be aware that they are possibilities.  In general, it is similar to taking a shower.  Everybody knows that you can slip, hit your head and get killed.  Does that mean that you should not shower again?  Nevertheless always keep in mind that statistics do not mean anything if you happen to be on the wrong side of them.  Even if a procedure has a 1 (one) in a  1,000,000 (million) chance of going wrong, it you happen to be that one..Also, keep in mind that by statistics, you have more of a chance of having something go wrong when taking medications. ° °Who should not have this procedure? °If you are on a blood thinning medication (e.g. Coumadin, Plavix, see list of "Blood Thinners"), or if you have an active infection going on, you should not have the procedure.  If you are taking any blood thinners, please inform your physician. ° °How should I prepare for this procedure? °Do not eat or drink anything at least six hours prior to the procedure. °Bring a driver with you .  It cannot be a taxi. °Come accompanied by an adult that can drive you back, and that is strong enough to help you if your legs get weak or numb from the local anesthetic. °Take all of your medicines the morning of the procedure with just enough water to swallow them. °If you have diabetes, make sure that you are scheduled to have your procedure done first thing in the morning, whenever possible. °If you have diabetes, take only half of your insulin dose and notify our nurse that you have done so as soon as you arrive at the clinic. °If you are diabetic, but only take blood sugar pills (oral hypoglycemic), then do not take them on the morning of your procedure.  You may take them after you have had the procedure. °Do not take aspirin or any aspirin-containing medications, at least eleven (11) days prior to the procedure.  They may prolong bleeding. °Wear loose fitting clothing that may be easy to take off and that you would not mind if it got stained with Betadine or blood. °Do not wear any jewelry or perfume °Remove any nail coloring.  It will interfere with some of our monitoring equipment. ° °NOTE: Remember that this is not meant to be interpreted as a complete list of all possible complications.  Unforeseen problems may occur. ° °BLOOD THINNERS °The following drugs contain aspirin or other products,  which can cause increased bleeding during surgery and should not be taken for 2 weeks prior to and 1 week after surgery.  If you should need take something for relief of minor pain, you may take acetaminophen which is found in Tylenol,m Datril, Anacin-3 and Panadol. It is not blood thinner. The products listed below are.  Do not take any of the products listed below in addition to any listed on your instruction sheet. ° °A.P.C or A.P.C with Codeine Codeine Phosphate Capsules #3 Ibuprofen Ridaura  °ABC compound Congesprin Imuran rimadil  °Advil Cope Indocin Robaxisal  °Alka-Seltzer Effervescent Pain Reliever and Antacid Coricidin or Coricidin-D ° Indomethacin Rufen  °Alka-Seltzer plus Cold Medicine Cosprin Ketoprofen S-A-C Tablets  °Anacin Analgesic Tablets or Capsules Coumadin   Korlgesic Salflex  Anacin Extra Strength Analgesic tablets or capsules CP-2 Tablets Lanoril Salicylate  Anaprox Cuprimine Capsules Levenox Salocol  Anexsia-D Dalteparin Magan Salsalate  Anodynos Darvon compound Magnesium Salicylate Sine-off  Ansaid Dasin Capsules Magsal Sodium Salicylate  Anturane Depen Capsules Marnal Soma  APF Arthritis pain formula Dewitt's Pills Measurin Stanback  Argesic Dia-Gesic Meclofenamic Sulfinpyrazone  Arthritis Bayer Timed Release Aspirin Diclofenac Meclomen Sulindac  Arthritis pain formula Anacin Dicumarol Medipren Supac  Analgesic (Safety coated) Arthralgen Diffunasal Mefanamic Suprofen  Arthritis Strength Bufferin Dihydrocodeine Mepro Compound Suprol  Arthropan liquid Dopirydamole Methcarbomol with Aspirin Synalgos  ASA tablets/Enseals Disalcid Micrainin Tagament  Ascriptin Doan's Midol Talwin  Ascriptin A/D Dolene Mobidin Tanderil  Ascriptin Extra Strength Dolobid Moblgesic Ticlid  Ascriptin with Codeine Doloprin or Doloprin with Codeine Momentum Tolectin  Asperbuf Duoprin Mono-gesic Trendar  Aspergum Duradyne Motrin or Motrin IB Triminicin  Aspirin plain, buffered or enteric coated  Durasal Myochrisine Trigesic  Aspirin Suppositories Easprin Nalfon Trillsate  Aspirin with Codeine Ecotrin Regular or Extra Strength Naprosyn Uracel  Atromid-S Efficin Naproxen Ursinus  Auranofin Capsules Elmiron Neocylate Vanquish  Axotal Emagrin Norgesic Verin  Azathioprine Empirin or Empirin with Codeine Normiflo Vitamin E  Azolid Emprazil Nuprin Voltaren  Bayer Aspirin plain, buffered or children's or timed BC Tablets or powders Encaprin Orgaran Warfarin Sodium  Buff-a-Comp Enoxaparin Orudis Zorpin  Buff-a-Comp with Codeine Equegesic Os-Cal-Gesic   Buffaprin Excedrin plain, buffered or Extra Strength Oxalid   Bufferin Arthritis Strength Feldene Oxphenbutazone   Bufferin plain or Extra Strength Feldene Capsules Oxycodone with Aspirin   Bufferin with Codeine Fenoprofen Fenoprofen Pabalate or Pabalate-SF   Buffets II Flogesic Panagesic   Buffinol plain or Extra Strength Florinal or Florinal with Codeine Panwarfarin   Buf-Tabs Flurbiprofen Penicillamine   Butalbital Compound Four-way cold tablets Penicillin   Butazolidin Fragmin Pepto-Bismol   Carbenicillin Geminisyn Percodan   Carna Arthritis Reliever Geopen Persantine   Carprofen Gold's salt Persistin   Chloramphenicol Goody's Phenylbutazone   Chloromycetin Haltrain Piroxlcam   Clmetidine heparin Plaquenil   Cllnoril Hyco-pap Ponstel   Clofibrate Hydroxy chloroquine Propoxyphen         Before stopping any of these medications, be sure to consult the physician who ordered them.  Some, such as Coumadin (Warfarin) are ordered to prevent or treat serious conditions such as "deep thrombosis", "pumonary embolisms", and other heart problems.  The amount of time that you may need off of the medication may also vary with the medication and the reason for which you were taking it.  If you are taking any of these medications, please make sure you notify your pain physician before you undergo any procedures.         Moderate Conscious  Sedation, Adult, Care After This sheet gives you information about how to care for yourself after your procedure. Your health care provider may also give you more specific instructions. If you have problems or questions, contact your health care provider. What can I expect after the procedure? After the procedure, it is common to have: Sleepiness for several hours. Impaired judgment for several hours. Difficulty with balance. Vomiting if you eat too soon. Follow these instructions at home: For the time period you were told by your health care provider:     Rest. Do not participate in activities where you could fall or become injured. Do not drive or use machinery. Do not drink alcohol. Do not take sleeping pills or medicines that cause drowsiness. Do not make important decisions or sign legal documents. Do  not take care of children on your own. Eating and drinking  Follow the diet recommended by your health care provider. Drink enough fluid to keep your urine pale yellow. If you vomit: Drink water, juice, or soup when you can drink without vomiting. Make sure you have little or no nausea before eating solid foods. General instructions Take over-the-counter and prescription medicines only as told by your health care provider. Have a responsible adult stay with you for the time you are told. It is important to have someone help care for you until you are awake and alert. Do not smoke. Keep all follow-up visits as told by your health care provider. This is important. Contact a health care provider if: You are still sleepy or having trouble with balance after 24 hours. You feel light-headed. You keep feeling nauseous or you keep vomiting. You develop a rash. You have a fever. You have redness or swelling around the IV site. Get help right away if: You have trouble breathing. You have new-onset confusion at home. Summary After the procedure, it is common to feel sleepy, have  impaired judgment, or feel nauseous if you eat too soon. Rest after you get home. Know the things you should not do after the procedure. Follow the diet recommended by your health care provider and drink enough fluid to keep your urine pale yellow. Get help right away if you have trouble breathing or new-onset confusion at home. This information is not intended to replace advice given to you by your health care provider. Make sure you discuss any questions you have with your health care provider. Document Revised: 09/23/2019 Document Reviewed: 04/21/2019 Elsevier Patient Education  [employer]. Epidural Steroid Injection Patient Information  Description: The epidural space surrounds the nerves as they exit the spinal cord.  In some patients, the nerves can be compressed and inflamed by a bulging disc or a tight spinal canal (spinal stenosis).  By injecting steroids into the epidural space, we can bring irritated nerves into direct contact with a potentially helpful medication.  These steroids act directly on the irritated nerves and can reduce swelling and inflammation which often leads to decreased pain.  Epidural steroids may be injected anywhere along the spine and from the neck to the low back depending upon the location of your pain.   After numbing the skin with local anesthetic (like Novocaine), a small needle is passed into the epidural space slowly.  You may experience a sensation of pressure while this is being done.  The entire block usually last less than 10 minutes.  Conditions which may be treated by epidural steroids:  Low back and leg pain Neck and arm pain Spinal stenosis Post-laminectomy syndrome Herpes zoster (shingles) pain Pain from compression fractures  Preparation for the injection:  Do not eat any solid food or dairy products within 8 hours of your appointment.  You may drink clear liquids up to 3 hours before appointment.  Clear liquids include water, black  coffee, juice or soda.  No milk or cream please. You may take your regular medication, including pain medications, with a sip of water before your appointment  Diabetics should hold regular insulin (if taken separately) and take 1/2 normal NPH dos the morning of the procedure.  Carry some sugar containing items with you to your appointment. A driver must accompany you and be prepared to drive you home after your procedure.  Bring all your current medications with your. An IV may be inserted and  sedation may be given at the discretion of the physician.   A blood pressure cuff, EKG and other monitors will often be applied during the procedure.  Some patients may need to have extra oxygen administered for a short period. You will be asked to provide medical information, including your allergies, prior to the procedure.  We must know immediately if you are taking blood thinners (like Coumadin/Warfarin)  Or if you are allergic to IV iodine contrast (dye). We must know if you could possible be pregnant.  Possible side-effects: Bleeding from needle site Infection (rare, may require surgery) Nerve injury (rare) Numbness & tingling (temporary) Difficulty urinating (rare, temporary) Spinal headache ( a headache worse with upright posture) Light -headedness (temporary) Pain at injection site (several days) Decreased blood pressure (temporary) Weakness in arm/leg (temporary) Pressure sensation in back/neck (temporary)  Call if you experience: Fever/chills associated with headache or increased back/neck pain. Headache worsened by an upright position. New onset weakness or numbness of an extremity below the injection site Hives or difficulty breathing (go to the emergency room) Inflammation or drainage at the infection site Severe back/neck pain Any new symptoms which are concerning to you  Please note:  Although the local anesthetic injected can often make your back or neck feel good for several  hours after the injection, the pain will likely return.  It takes 3-7 days for steroids to work in the epidural space.  You may not notice any pain relief for at least that one week.  If effective, we will often do a series of three injections spaced 3-6 weeks apart to maximally decrease your pain.  After the initial series, we generally will wait several months before considering a repeat injection of the same type.  If you have any questions, please call [phone] [facility]

## 2022-03-27 NOTE — Progress Notes (Signed)
Safety precautions to be maintained throughout the outpatient stay will include: orient to surroundings, keep bed in low position, maintain call bell within reach at all times, provide assistance with transfer out of bed and ambulation.  

## 2022-04-01 DIAGNOSIS — M179 Osteoarthritis of knee, unspecified: Secondary | ICD-10-CM | POA: Diagnosis not present

## 2022-04-01 DIAGNOSIS — M5136 Other intervertebral disc degeneration, lumbar region: Secondary | ICD-10-CM | POA: Diagnosis not present

## 2022-04-07 ENCOUNTER — Other Ambulatory Visit (INDEPENDENT_AMBULATORY_CARE_PROVIDER_SITE_OTHER): Payer: Medicare Other

## 2022-04-07 DIAGNOSIS — E890 Postprocedural hypothyroidism: Secondary | ICD-10-CM | POA: Diagnosis not present

## 2022-04-07 LAB — T4, FREE: Free T4: 0.58 ng/dL — ABNORMAL LOW (ref 0.60–1.60)

## 2022-04-07 LAB — TSH: TSH: 51.95 u[IU]/mL — ABNORMAL HIGH (ref 0.35–5.50)

## 2022-04-09 DIAGNOSIS — E039 Hypothyroidism, unspecified: Secondary | ICD-10-CM | POA: Diagnosis not present

## 2022-04-09 DIAGNOSIS — K2100 Gastro-esophageal reflux disease with esophagitis, without bleeding: Secondary | ICD-10-CM | POA: Diagnosis not present

## 2022-04-09 DIAGNOSIS — N281 Cyst of kidney, acquired: Secondary | ICD-10-CM | POA: Diagnosis not present

## 2022-04-09 DIAGNOSIS — J449 Chronic obstructive pulmonary disease, unspecified: Secondary | ICD-10-CM | POA: Diagnosis not present

## 2022-04-09 DIAGNOSIS — I2510 Atherosclerotic heart disease of native coronary artery without angina pectoris: Secondary | ICD-10-CM | POA: Diagnosis not present

## 2022-04-09 DIAGNOSIS — E119 Type 2 diabetes mellitus without complications: Secondary | ICD-10-CM | POA: Diagnosis not present

## 2022-04-09 DIAGNOSIS — K589 Irritable bowel syndrome without diarrhea: Secondary | ICD-10-CM | POA: Diagnosis not present

## 2022-04-09 DIAGNOSIS — M1990 Unspecified osteoarthritis, unspecified site: Secondary | ICD-10-CM | POA: Diagnosis not present

## 2022-04-09 DIAGNOSIS — Z9009 Acquired absence of other part of head and neck: Secondary | ICD-10-CM | POA: Diagnosis not present

## 2022-04-10 ENCOUNTER — Encounter: Payer: Self-pay | Admitting: Endocrinology

## 2022-04-10 ENCOUNTER — Ambulatory Visit (INDEPENDENT_AMBULATORY_CARE_PROVIDER_SITE_OTHER): Payer: Medicare Other | Admitting: Endocrinology

## 2022-04-10 VITALS — BP 104/62 | HR 63 | Ht 61.0 in | Wt 196.8 lb

## 2022-04-10 DIAGNOSIS — E890 Postprocedural hypothyroidism: Secondary | ICD-10-CM

## 2022-04-10 MED ORDER — LEVOTHYROXINE SODIUM 175 MCG PO TABS: 175.0000 ug | ORAL_TABLET | Freq: Every day | ORAL | 2 refills | Status: DC

## 2022-04-10 NOTE — Progress Notes (Signed)
Patient ID: Janet Murray, female   DOB: 03-16-1971, 51 y.o.   MRN: [id number]  Reason for Appointment:  Hypothyroidism, followup visit    History of Present Illness:   HYPOTHYROIDISM secondary to total thyroidectomy  Her thyroidectomy was done in 04/2021  The patient has been treated with LEVOTHYROXINE 175 mcg daily The last visit was in 5/23 Levothyroxine was continued unchanged at that time  For the last 1 month has been more lethargic, sleepy, somewhat cold; she thinks she has gained weight but does not appear to have  She states that she has missed a few doses of her levothyroxine, recently about twice a week She appears to be taking her multivitamin with calcium and iron in the morning at the same time also now  Pharmacy records were reviewed and the pharmacy was called, she has not had a refill of her 90-day prescription since April  Her TSH is now unusually high at 46 Previously was normal  Lab Results  Component Value Date   TSH 51.95 (H) 04/07/2022   TSH 1.25 10/29/2021   TSH 6.960 (H) 09/27/2021   FREET4 0.58 (L) 04/07/2022   FREET4 0.97 10/29/2021   FREET4 1.22 09/27/2021   Wt Readings from Last 3 Encounters:  04/10/22 196 lb 12.8 oz (89.3 kg)  03/27/22 195 lb 8 oz (88.7 kg)  01/16/22 194 lb 12.8 oz (88.4 kg)    THYROID CANCER:  This was diagnosed when she had a thyroid nodule discovered by her PCP on exam She was then referred for surgery Detailed history and evaluation of previous work-up and treatment was reviewed  THYROIDECTOMY in 11/22 showed the following pathology  Tumor Size: 1.6 x 1.2 x 0.8 cm  Histologic Type: Follicular carcinoma, minimally invasive  Angioinvasion (Vascular Invasion): Not identified  Lymphatic Invasion: Not identified  Extrathyroidal Extension: Not identified  Margin Status: All margins negative for carcinoma Negative for lymph node metastases  Other treatment:  RADIOPHARMACEUTICALS:  53.2 mCi I-131 sodium  iodide on 09/02/2021  Posttherapy scan only showed thyroid remnant   Last THYROGLOBULIN level:  Lab Results  Component Value Date   THYROGLB 0.4 (L) 10/29/2021     Lab on 04/07/2022  Component Date Value Ref Range Status   Free T4 04/07/2022 0.58 (L)  0.60 - 1.60 ng/dL Final   Comment: Specimens from patients who are undergoing biotin therapy and /or ingesting biotin supplements may contain high levels of biotin.  The higher biotin concentration in these specimens interferes with this Free T4 assay.  Specimens that contain high levels  of biotin may cause false high results for this Free T4 assay.  Please interpret results in light of the total clinical presentation of the patient.     TSH 04/07/2022 51.95 (H)  0.35 - 5.50 uIU/mL Final    Allergies as of 04/10/2022       Reactions   Penicillins Hives, Swelling   Has patient had a PCN reaction causing immediate rash, facial/tongue/throat swelling, SOB or lightheadedness with hypotension: Yes Has patient had a PCN reaction causing severe rash involving mucus membranes or skin necrosis: No Has patient had a PCN reaction that required hospitalization No Has patient had a PCN reaction occurring within the last 10 years: No If all of the above answers are "NO", then may proceed with Cephalosporin use.   Acetaminophen    Due to polyps    Ciprofloxacin Itching   Aspirin Palpitations   Heart palpitations   Hydrocodone Hives, Rash  Tape Rash   Per patient "clear tape"        Medication List        Accurate as of April 10, 2022  1:21 PM. If you have any questions, ask your nurse or doctor.          albuterol (2.5 MG/3ML) 0.083% nebulizer solution Commonly known as: PROVENTIL Take 3 mLs (2.5 mg total) by nebulization every 6 (six) hours as needed for wheezing or shortness of breath.   albuterol 108 (90 Base) MCG/ACT inhaler Commonly known as: VENTOLIN HFA Inhale 2 puffs into the lungs every 6 (six) hours as needed for  wheezing or shortness of breath.   AMBULATORY NON FORMULARY MEDICATION Medication Name: nebulizer DX: J44.9   atorvastatin 80 MG tablet Commonly known as: LIPITOR Take 1 tablet (80 mg total) by mouth daily.   Bevespi Aerosphere 9-4.8 MCG/ACT Aero Generic drug: Glycopyrrolate-Formoterol INHALE 2 PUFFS BY MOUTH INTO THE LUNGS 2TIMES DAILY   diclofenac Sodium 1 % Gel Commonly known as: VOLTAREN Apply 1 application topically daily as needed (Knee pain).   esomeprazole 40 MG capsule Commonly known as: NEXIUM TAKE ONE CAPSULE BY MOUTH TWICE A DAY   hydrOXYzine 25 MG tablet Commonly known as: ATARAX Take 1 tablet (25 mg total) by mouth 3 (three) times daily as needed. What changed: reasons to take this   levothyroxine 175 MCG tablet Commonly known as: SYNTHROID TAKE 1 TABLET BY MOUTH DAILY   methylPREDNISolone 4 MG Tbpk tablet Commonly known as: MEDROL DOSEPAK Use as directed x 6 days.   montelukast 10 MG tablet Commonly known as: SINGULAIR TAKE 1 TABLET BY MOUTH AT BEDTIME   naloxone 4 MG/0.1ML Liqd nasal spray kit Commonly known as: NARCAN SMARTSIG:Both Nares   ondansetron 4 MG disintegrating tablet Commonly known as: ZOFRAN-ODT TAKE 1 TABLET BY MOUTH EVERY 8 HOURS AS NEEDED   oxyCODONE 5 MG immediate release tablet Commonly known as: Oxy IR/ROXICODONE Take 1 tablet (5 mg total) by mouth every 4 (four) hours as needed for severe pain.   pramipexole 0.5 MG tablet Commonly known as: MIRAPEX TAKE ONE TABLET BY MOUTH THREE TIMES A DAY   tiZANidine 2 MG tablet Commonly known as: ZANAFLEX Take 2 mg by mouth 2 (two) times daily.   Victoza 18 MG/3ML Sopn Generic drug: liraglutide Inject 1.2 mg into the skin daily.   Vitamin D (Ergocalciferol) 1.25 MG (50000 UNIT) Caps capsule Commonly known as: DRISDOL Take 1 capsule (50,000 Units total) by mouth every 7 (seven) days.        Allergies:  Allergies  Allergen Reactions   Penicillins Hives and Swelling    Has  patient had a PCN reaction causing immediate rash, facial/tongue/throat swelling, SOB or lightheadedness with hypotension: Yes Has patient had a PCN reaction causing severe rash involving mucus membranes or skin necrosis: No Has patient had a PCN reaction that required hospitalization No Has patient had a PCN reaction occurring within the last 10 years: No If all of the above answers are "NO", then may proceed with Cephalosporin use.   Acetaminophen     Due to polyps    Ciprofloxacin Itching   Aspirin Palpitations    Heart palpitations   Hydrocodone Hives and Rash   Tape Rash    Per patient "clear tape"    Past Medical History:  Diagnosis Date   Abdominal mass, RUQ (right upper quadrant)    Anxiety    Arthritis    Asthma    Bronchitis  Cancer ([place]) 04/12/2021   Carpal tunnel syndrome of left wrist 10/25/2014   Chronic bronchitis, obstructive 07/20/2017   Chronic pain    COPD (chronic obstructive pulmonary disease) (HCC)    Coronary artery disease    Cyst of right kidney 03/22/2018   DDD (degenerative disc disease), lumbar 10/25/2014   Degenerative joint disease (DJD) of hip    Diarrhea of infectious origin 03/25/2018   Dysphagia    Dyspnea    Emphysema of lung (HCC)    Facet syndrome, lumbar 10/25/2014   Fatty liver 03/25/2018   Foot fracture, left 05/02/2015   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   GERD (gastroesophageal reflux disease)    Hereditary and idiopathic peripheral neuropathy 11/06/2014   Hypothyroidism    IBS (irritable bowel syndrome)    Low back pain    Migraine 02/04/2016   Occipital neuralgia    Osteoarthritis of spine with radiculopathy, lumbar region 10/25/2014   Other intervertebral disc degeneration, lumbar region    Other irritable bowel syndrome 04/20/2017   Other spondylosis with radiculopathy, lumbar region 10/25/2014   RLS (restless legs syndrome)    Sacroiliac joint dysfunction of both sides 10/25/2014   Spondylosis of lumbar spine    Urinary  incontinence     Past Surgical History:  Procedure Laterality Date   APPENDECTOMY     BLADDER SURGERY     sling - Dr. Davis Gourd   CESAREAN SECTION     COLONOSCOPY WITH PROPOFOL N/A 04/11/2020   Procedure: COLONOSCOPY WITH PROPOFOL;  Surgeon: Virgel Manifold, MD;  Location: [facility] ENDOSCOPY;  Service: Endoscopy;  Laterality: N/A;   COLONOSCOPY WITH PROPOFOL N/A 10/22/2021   Procedure: COLONOSCOPY WITH PROPOFOL;  Surgeon: Lin Landsman, MD;  Location: [facility] ENDOSCOPY;  Service: Gastroenterology;  Laterality: N/A;   ESOPHAGOGASTRODUODENOSCOPY  04/26/2019   STOMACH SURGERY     THYROIDECTOMY N/A 04/12/2021   Procedure: THYROIDECTOMY, total;  Surgeon: Jules Husbands, MD;  Location: [facility] ORS;  Service: General;  Laterality: N/A;   TOTAL ABDOMINAL HYSTERECTOMY W/ BILATERAL SALPINGOOPHORECTOMY      Family History  Problem Relation Age of Onset   Neuropathy Mother    Arthritis Father    Hypertension Father    Leukemia Father    Heart disease Father        CAD s/p CABG   Healthy Brother    Healthy Daughter    Healthy Son    Dementia Maternal Grandmother    Hypertension Maternal Grandmother    Hypercholesterolemia Maternal Grandmother    Heart disease Maternal Grandmother    Healthy Brother    Healthy Brother    Stroke Neg Hx    Cancer Neg Hx     Social History:  reports that she has been smoking cigarettes. She has a 24.00 pack-year smoking history. She has never used smokeless tobacco. She reports current alcohol use. She reports that she does not use drugs.  Review of Systems     Examination:   BP 104/62   Pulse 63   Ht _0  (1.549 m)   Wt 196 lb 12.8 oz (89.3 kg)   SpO2 96%   BMI 37.19 kg/m   GENERAL APPEARANCE: Mild puffiness of the face present No hoarseness Biceps reflexes show normal relaxation Skin: Not unusual dry    Assessment    Hypothyroidism postsurgical:   She appears to be markedly hypothyroid now both symptomatic and with significantly  high TSH As discussed above she likely has been very regular with her thyroid supplement since her  last visit and has used up only 90 tablets in the last 6 months  Also is likely taking calcium and iron-containing multivitamin at the same time    Treatment:   Continue same dosage of levothyroxine 175 mcg for now She needs to take this before breakfast daily even if she has to set up an alarm to take it consistently.  New prescription called in Avoid taking any calcium or iron supplements with the thyroid supplement.   Follow-up in 8 weeks  Elayne Snare 04/10/2022, 1:21 PM   Note: This office note was prepared with Dragon voice recognition system technology. Any transcriptional errors that result from this process are unintentional.

## 2022-04-10 NOTE — Patient Instructions (Signed)
No vitamin at same time as thyroid pill in am

## 2022-04-15 ENCOUNTER — Other Ambulatory Visit: Payer: Self-pay | Admitting: Pulmonary Disease

## 2022-04-15 ENCOUNTER — Other Ambulatory Visit: Payer: Self-pay | Admitting: Nurse Practitioner

## 2022-04-15 DIAGNOSIS — M5416 Radiculopathy, lumbar region: Secondary | ICD-10-CM | POA: Diagnosis not present

## 2022-04-15 DIAGNOSIS — M259 Joint disorder, unspecified: Secondary | ICD-10-CM | POA: Diagnosis not present

## 2022-04-15 DIAGNOSIS — M7600 Gluteal tendinitis, unspecified hip: Secondary | ICD-10-CM | POA: Diagnosis not present

## 2022-04-15 DIAGNOSIS — M5481 Occipital neuralgia: Secondary | ICD-10-CM | POA: Diagnosis not present

## 2022-04-15 DIAGNOSIS — G894 Chronic pain syndrome: Secondary | ICD-10-CM | POA: Diagnosis not present

## 2022-04-15 DIAGNOSIS — M9904 Segmental and somatic dysfunction of sacral region: Secondary | ICD-10-CM | POA: Diagnosis not present

## 2022-04-15 DIAGNOSIS — M179 Osteoarthritis of knee, unspecified: Secondary | ICD-10-CM | POA: Diagnosis not present

## 2022-04-15 DIAGNOSIS — M5136 Other intervertebral disc degeneration, lumbar region: Secondary | ICD-10-CM | POA: Diagnosis not present

## 2022-04-15 NOTE — Telephone Encounter (Signed)
Requested medication (s) are due for refill today: yes  Requested medication (s) are on the active medication list: yes  Last refill:  12/27/21  Future visit scheduled: yes  Notes to clinic:  Unable to refill per protocol due to failed labs, no updated results. No updated A1c      Requested Prescriptions  Pending Prescriptions Disp Refills   VICTOZA 18 MG/3ML SOPN [Pharmacy Med Name: VICTOZA 18 MG/3ML SUBQ SOLN ML] 6 mL     Sig: Inject 1.2 mg into the skin daily.     Endocrinology:  Diabetes - GLP-1 Receptor Agonists Failed - 04/15/2022  3:00 PM      Failed - HBA1C is between 0 and 7.9 and within 180 days    No results found for: "HGBA1C", "LABA1C"       Passed - Valid encounter within last 6 months    Recent Outpatient Visits           3 months ago Vitamin D deficiency   [facility] Jon Billings, NP   4 months ago Osteoarthritis of spine with radiculopathy, lumbar region   [facility] Jon Billings, NP   6 months ago Acquired hypothyroidism   [facility] Jon Billings, NP   9 months ago Osteoarthritis of spine with radiculopathy, lumbar region   [place], Santiago Glad, NP   9 months ago Allergic rhinitis, unspecified seasonality, unspecified trigger   [facility] Jon Billings, NP       Future Appointments             In 2 months Jon Billings, NP [facility], [place]

## 2022-04-21 ENCOUNTER — Other Ambulatory Visit: Payer: Medicare Other

## 2022-04-23 ENCOUNTER — Encounter: Payer: Self-pay | Admitting: Student in an Organized Health Care Education/Training Program

## 2022-04-23 ENCOUNTER — Ambulatory Visit
Admission: RE | Admit: 2022-04-23 | Discharge: 2022-04-23 | Disposition: A | Discharge status: Home / Self Care | Payer: Medicare Other | Source: Ambulatory Visit | Attending: Student in an Organized Health Care Education/Training Program | Admitting: Student in an Organized Health Care Education/Training Program

## 2022-04-23 ENCOUNTER — Ambulatory Visit
Payer: Medicare Other | Attending: Student in an Organized Health Care Education/Training Program | Admitting: Student in an Organized Health Care Education/Training Program

## 2022-04-23 DIAGNOSIS — G894 Chronic pain syndrome: Secondary | ICD-10-CM | POA: Insufficient documentation

## 2022-04-23 DIAGNOSIS — M48062 Spinal stenosis, lumbar region with neurogenic claudication: Secondary | ICD-10-CM | POA: Diagnosis not present

## 2022-04-23 DIAGNOSIS — M48061 Spinal stenosis, lumbar region without neurogenic claudication: Secondary | ICD-10-CM | POA: Insufficient documentation

## 2022-04-23 MED ORDER — DEXAMETHASONE SODIUM PHOSPHATE 10 MG/ML IJ SOLN
INTRAMUSCULAR | Status: AC
  Filled 2022-04-23: qty 1

## 2022-04-23 MED ORDER — MIDAZOLAM HCL 2 MG/2ML IJ SOLN: 0.5000 mg | Freq: Once | INTRAMUSCULAR | Status: DC

## 2022-04-23 MED ORDER — ROPIVACAINE HCL 2 MG/ML IJ SOLN
2.0000 mL | Freq: Once | INTRAMUSCULAR | Status: AC
  Administered 2022-04-23: 2 mL via EPIDURAL

## 2022-04-23 MED ORDER — LACTATED RINGERS IV SOLN: Freq: Once | INTRAVENOUS | Status: AC

## 2022-04-23 MED ORDER — LIDOCAINE HCL 2 % IJ SOLN
INTRAMUSCULAR | Status: AC
  Filled 2022-04-23: qty 20

## 2022-04-23 MED ORDER — MIDAZOLAM HCL 2 MG/2ML IJ SOLN
INTRAMUSCULAR | Status: AC
  Filled 2022-04-23: qty 2

## 2022-04-23 MED ORDER — SODIUM CHLORIDE (PF) 0.9 % IJ SOLN
INTRAMUSCULAR | Status: AC
  Filled 2022-04-23: qty 10

## 2022-04-23 MED ORDER — LIDOCAINE HCL 2 % IJ SOLN
20.0000 mL | Freq: Once | INTRAMUSCULAR | Status: AC
  Administered 2022-04-23: 400 mg

## 2022-04-23 MED ORDER — ROPIVACAINE HCL 2 MG/ML IJ SOLN
INTRAMUSCULAR | Status: AC
  Filled 2022-04-23: qty 20

## 2022-04-23 MED ORDER — IOHEXOL 180 MG/ML  SOLN
INTRAMUSCULAR | Status: AC
  Filled 2022-04-23: qty 20

## 2022-04-23 MED ORDER — DEXAMETHASONE SODIUM PHOSPHATE 10 MG/ML IJ SOLN
10.0000 mg | Freq: Once | INTRAMUSCULAR | Status: AC
  Administered 2022-04-23: 10 mg

## 2022-04-23 MED ORDER — SODIUM CHLORIDE 0.9% FLUSH
2.0000 mL | Freq: Once | INTRAVENOUS | Status: AC
  Administered 2022-04-23: 2 mL

## 2022-04-23 MED ORDER — IOHEXOL 180 MG/ML  SOLN
10.0000 mL | Freq: Once | INTRAMUSCULAR | Status: AC
  Administered 2022-04-23: 10 mL via EPIDURAL

## 2022-04-23 NOTE — Progress Notes (Signed)
Safety precautions to be maintained throughout the outpatient stay will include: orient to surroundings, keep bed in low position, maintain call bell within reach at all times, provide assistance with transfer out of bed and ambulation.  

## 2022-04-23 NOTE — Progress Notes (Signed)
PROVIDER NOTE: Interpretation of information contained herein should be left to medically-trained personnel. Specific patient instructions are provided elsewhere under "Patient Instructions" section of medical record. This document was created in part using STT-dictation technology, any transcriptional errors that may result from this process are unintentional.  Patient: Janet Murray Type: Established DOB: 10-24-1970 MRN: [id number] PCP: Jon Billings, NP  Service: Procedure DOS: 04/23/2022 Setting: Ambulatory Location: Ambulatory outpatient facility Delivery: Face-to-face Provider: Gillis Santa, MD Specialty: Interventional Pain Management Specialty designation: 09 Location: Outpatient facility Ref. Prov.: Gillis Santa, MD    Primary Reason for Visit: Interventional Pain Management Treatment. CC: Back Pain (low)   Procedure:           Type: Lumbar epidural steroid injection (LESI) (interlaminar) #1    Laterality: Right   Level:  L4-5 Level.  Imaging: Fluoroscopic guidance         Anesthesia: Local anesthesia (1-2% Lidocaine) Anxiolysis: None                 DOS: 04/23/2022  Performed by: Gillis Santa, MD  Purpose: Diagnostic/Therapeutic Indications: Lumbar radicular pain of intraspinal etiology of more than 4 weeks that has failed to respond to conservative therapy and is severe enough to impact quality of life or function. 1. 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 Chronic pain syndrome    NAS-11 Pain score:   Pre-procedure: 8 /10   Post-procedure: 0-No pain/10      Position / Prep / Materials:  Position: Prone w/ head of the table raised (slight reverse trendelenburg) to facilitate breathing.  Prep solution: DuraPrep (Iodine Povacrylex [0.7% available iodine] and Isopropyl Alcohol, 74% w/w) Prep Area: Entire Posterior Lumbar Region from lower scapular tip down to mid buttocks area  and from flank to flank. Materials:  Tray: Epidural tray Needle(s):  Type: Epidural needle (Tuohy) Gauge (G):  22 Length: Regular (3.5-in) Qty: 1  Pre-op H&P Assessment:  Janet Murray is a 51 y.o. (year old), female patient, seen today for interventional treatment. She  has a past surgical history that includes Bladder surgery; Cesarean section; Appendectomy; Total abdominal hysterectomy w/ bilateral salpingoophorectomy; Esophagogastroduodenoscopy (04/26/2019); Stomach surgery; Colonoscopy with propofol (N/A, 04/11/2020); Thyroidectomy (N/A, 04/12/2021); and Colonoscopy with propofol (N/A, 10/22/2021). Janet Murray has a current medication list which includes the following prescription(s): albuterol, albuterol, AMBULATORY NON FORMULARY MEDICATION, atorvastatin, diclofenac sodium, esomeprazole, bevespi aerosphere, hydroxyzine, levothyroxine, victoza, montelukast, naloxone, ondansetron, oxycodone, pramipexole, tizanidine, and vitamin d (ergocalciferol), and the following Facility-Administered Medications: lactated ringers and midazolam. Her primarily concern today is the Back Pain (low)  Initial Vital Signs:  Pulse/HCG Rate: 75  Temp: (!) 97.3 F (36.3 C) Resp: 18 BP: (!) 114/92 SpO2: 98 %  BMI: Estimated body mass index is 35.9 kg/m as calculated from the following:   Height as of this encounter: '5\' 1"'$  (1.549 m).   Weight as of this encounter: 190 lb (86.2 kg).  Risk Assessment: Allergies: Reviewed. She is allergic to penicillins, acetaminophen, ciprofloxacin, aspirin, hydrocodone, and tape.  Allergy Precautions: None required Coagulopathies: Reviewed. None identified.  Blood-thinner therapy: None at this time Active Infection(s): Reviewed. None identified. Janet Murray is afebrile  Site Confirmation: Janet Murray was asked to confirm the procedure and laterality before marking the site Procedure checklist: Completed Consent: Before the procedure and under the influence of no sedative(s),  amnesic(s), or anxiolytics, the patient was informed of the treatment options, risks and possible complications. To fulfill  our ethical and legal obligations, as recommended by the American Medical Association's Code of Ethics, I have informed the patient of my clinical impression; the nature and purpose of the treatment or procedure; the risks, benefits, and possible complications of the intervention; the alternatives, including doing nothing; the risk(s) and benefit(s) of the alternative treatment(s) or procedure(s); and the risk(s) and benefit(s) of doing nothing. The patient was provided information about the general risks and possible complications associated with the procedure. These may include, but are not limited to: failure to achieve desired goals, infection, bleeding, organ or nerve damage, allergic reactions, paralysis, and death. In addition, the patient was informed of those risks and complications associated to Spine-related procedures, such as failure to decrease pain; infection (i.e.: Meningitis, epidural or intraspinal abscess); bleeding (i.e.: epidural hematoma, subarachnoid hemorrhage, or any other type of intraspinal or peri-dural bleeding); organ or nerve damage (i.e.: Any type of peripheral nerve, nerve root, or spinal cord injury) with subsequent damage to sensory, motor, and/or autonomic systems, resulting in permanent pain, numbness, and/or weakness of one or several areas of the body; allergic reactions; (i.e.: anaphylactic reaction); and/or death. Furthermore, the patient was informed of those risks and complications associated with the medications. These include, but are not limited to: allergic reactions (i.e.: anaphylactic or anaphylactoid reaction(s)); adrenal axis suppression; blood sugar elevation that in diabetics may result in ketoacidosis or comma; water retention that in patients with history of congestive heart failure may result in shortness of breath, pulmonary edema, and  decompensation with resultant heart failure; weight gain; swelling or edema; medication-induced neural toxicity; particulate matter embolism and blood vessel occlusion with resultant organ, and/or nervous system infarction; and/or aseptic necrosis of one or more joints. Finally, the patient was informed that Medicine is not an exact science; therefore, there is also the possibility of unforeseen or unpredictable risks and/or possible complications that may result in a catastrophic outcome. The patient indicated having understood very clearly. We have given the patient no guarantees and we have made no promises. Enough time was given to the patient to ask questions, all of which were answered to the patient's satisfaction. Ms. Flynn has indicated that she wanted to continue with the procedure. Attestation: I, the ordering provider, attest that I have discussed with the patient the benefits, risks, side-effects, alternatives, likelihood of achieving goals, and potential problems during recovery for the procedure that I have provided informed consent. Date  Time: 04/23/2022  1:29 PM  Pre-Procedure Preparation:  Monitoring: As per clinic protocol. Respiration, ETCO2, SpO2, BP, heart rate and rhythm monitor placed and checked for adequate function Safety Precautions: Patient was assessed for positional comfort and pressure points before starting the procedure. Time-out: I initiated and conducted the "Time-out" before starting the procedure, as per protocol. The patient was asked to participate by confirming the accuracy of the "Time Out" information. Verification of the correct person, site, and procedure were performed and confirmed by me, the nursing staff, and the patient. "Time-out" conducted as per Joint Commission's Universal Protocol (UP.01.01.01). Time: 1358  Description/Narrative of Procedure:          Target: Epidural space via interlaminar opening, initially targeting the lower laminar border of  the superior vertebral body. Region: Lumbar Approach: Percutaneous paravertebral  Rationale (medical necessity): procedure needed and proper for the diagnosis and/or treatment of the patient's medical symptoms and needs. Procedural Technique Safety Precautions: Aspiration looking for blood return was conducted prior to all injections. At no point did we inject any substances, as  a needle was being advanced. No attempts were made at seeking any paresthesias. Safe injection practices and needle disposal techniques used. Medications properly checked for expiration dates. SDV (single dose vial) medications used. Description of the Procedure: Protocol guidelines were followed. The procedure needle was introduced through the skin, ipsilateral to the reported pain, and advanced to the target area. Bone was contacted and the needle walked caudad, until the lamina was cleared. The epidural space was identified using "loss-of-resistance technique" with 2-3 ml of PF-NaCl (0.9% NSS), in a 5cc LOR glass syringe.  6 cc solution made of 3 cc of preservative-free saline, 2 cc of 0.2% ropivacaine, 1 cc of Decadron 10 mg/cc.   Vitals:   04/23/22 1331 04/23/22 1351 04/23/22 1400 04/23/22 1405  BP: (!) 114/92 127/68 104/79 106/61  Pulse: 75 72 73 70  Resp: '18 17 16 16  '$ Temp: (!) 97.3 F (36.3 C)     SpO2: 98% 98% 96% 97%  Weight: 190 lb (86.2 kg)     Height: '5\' 1"'$  (1.549 m)       Start Time: 1358 hrs. End Time: 1401 hrs.  Imaging Guidance (Spinal):          Type of Imaging Technique: Fluoroscopy Guidance (Spinal) Indication(s): Assistance in needle guidance and placement for procedures requiring needle placement in or near specific anatomical locations not easily accessible without such assistance. Exposure Time: Please see nurses notes. Contrast: Before injecting any contrast, we confirmed that the patient did not have an allergy to iodine, shellfish, or radiological contrast. Once satisfactory needle  placement was completed at the desired level, radiological contrast was injected. Contrast injected under live fluoroscopy. No contrast complications. See chart for type and volume of contrast used. Fluoroscopic Guidance: I was personally present during the use of fluoroscopy. "Tunnel Vision Technique" used to obtain the best possible view of the target area. Parallax error corrected before commencing the procedure. "Direction-depth-direction" technique used to introduce the needle under continuous pulsed fluoroscopy. Once target was reached, antero-posterior, oblique, and lateral fluoroscopic projection used confirm needle placement in all planes. Images permanently stored in EMR. Interpretation: I personally interpreted the imaging intraoperatively. Adequate needle placement confirmed in multiple planes. Appropriate spread of contrast into desired area was observed. No evidence of afferent or efferent intravascular uptake. No intrathecal or subarachnoid spread observed. Permanent images saved into the patient's record.  Post-operative Assessment:  Post-procedure Vital Signs:  Pulse/HCG Rate: 70  Temp: (!) 97.3 F (36.3 C) Resp: 16 BP: 106/61 SpO2: 97 %  EBL: None  Complications: No immediate post-treatment complications observed by team, or reported by patient.  Note: The patient tolerated the entire procedure well. A repeat set of vitals were taken after the procedure and the patient was kept under observation following institutional policy, for this type of procedure. Post-procedural neurological assessment was performed, showing return to baseline, prior to discharge. The patient was provided with post-procedure discharge instructions, including a section on how to identify potential problems. Should any problems arise concerning this procedure, the patient was given instructions to immediately contact us, at any time, without hesitation. In any case, we plan to contact the patient by telephone  for a follow-up status report regarding this interventional procedure.  Comments:  No additional relevant information.  Plan of Care  Orders:  Orders Placed This Encounter  Procedures   DG PAIN CLINIC C-ARM 1-60 MIN NO REPORT    Intraoperative interpretation by procedural physician at [place].    Standing Status:  Standing    Number of Occurrences:   1    Order Specific Question:   Reason for exam:    Answer:   Assistance in needle guidance and placement for procedures requiring needle placement in or near specific anatomical locations not easily accessible without such assistance.     Medications ordered for procedure: Meds ordered this encounter  Medications   iohexol (OMNIPAQUE) 180 MG/ML injection 10 mL    Must be Myelogram-compatible. If not available, you may substitute with a water-soluble, non-ionic, hypoallergenic, myelogram-compatible radiological contrast medium.   lidocaine (XYLOCAINE) 2 % (with pres) injection 400 mg   lactated ringers infusion   midazolam (VERSED) injection 0.5-2 mg    Make sure Flumazenil is available in the pyxis when using this medication. If oversedation occurs, administer 0.2 mg IV over 15 sec. If after 45 sec no response, administer 0.2 mg again over 1 min; may repeat at 1 min intervals; not to exceed 4 doses (1 mg)   sodium chloride flush (NS) 0.9 % injection 2 mL   ropivacaine (PF) 2 mg/mL (0.2%) (NAROPIN) injection 2 mL   dexamethasone (DECADRON) injection 10 mg   Medications administered: We administered iohexol, lidocaine, lactated ringers, sodium chloride flush, ropivacaine (PF) 2 mg/mL (0.2%), and dexamethasone.  See the medical record for exact dosing, route, and time of administration.  Follow-up plan:   Return in about 6 weeks (around 06/04/2022) for Post Procedure Evaluation, virtual.       Right L4/5 ESI 04/23/22  Recent Visits Date Type Provider Dept  03/27/22 Office Visit Gillis Santa, MD [facility]   Showing recent visits within past 90 days and meeting all other requirements Today's Visits Date Type Provider Dept  04/23/22 Procedure visit Gillis Santa, MD [facility]  Showing today's visits and meeting all other requirements Future Appointments Date Type Provider Dept  06/04/22 Appointment Gillis Santa, MD [facility]  Showing future appointments within next 90 days and meeting all other requirements  Disposition: Discharge home  Discharge (Date  Time): 04/23/2022; 1405 hrs.   Primary Care Physician: Jon Billings, NP Location: [facility] Note by: Gillis Santa, MD Date: 04/23/2022; Time: 2:23 PM  Disclaimer:  Medicine is not an exact science. The only guarantee in medicine is that nothing is guaranteed. It is important to note that the decision to proceed with this intervention was based on the information collected from the patient. The Data and conclusions were drawn from the patient's questionnaire, the interview, and the physical examination. Because the information was provided in large part by the patient, it cannot be guaranteed that it has not been purposely or unconsciously manipulated. Every effort has been made to obtain as much relevant data as possible for this evaluation. It is important to note that the conclusions that lead to this procedure are derived in large part from the available data. Always take into account that the treatment will also be dependent on availability of resources and existing treatment guidelines, considered by other Pain Management Practitioners as being common knowledge and practice, at the time of the intervention. For Medico-Legal purposes, it is also important to point out that variation in procedural techniques and pharmacological choices are the acceptable norm. The indications, contraindications, technique, and results of the above procedure should only be interpreted and judged by a  Board-Certified Interventional Pain Specialist with extensive familiarity and expertise in the same exact procedure and technique.

## 2022-04-24 ENCOUNTER — Telehealth: Payer: Self-pay

## 2022-04-24 NOTE — Telephone Encounter (Signed)
Post procedure phone call. Patient states she is doing good.  

## 2022-04-28 ENCOUNTER — Ambulatory Visit: Payer: Medicare Other | Admitting: Endocrinology

## 2022-05-01 DIAGNOSIS — M179 Osteoarthritis of knee, unspecified: Secondary | ICD-10-CM | POA: Diagnosis not present

## 2022-05-20 ENCOUNTER — Ambulatory Visit: Payer: Medicare Other | Admitting: Unknown Physician Specialty

## 2022-05-22 ENCOUNTER — Other Ambulatory Visit: Payer: Self-pay | Admitting: Nurse Practitioner

## 2022-05-22 NOTE — Telephone Encounter (Signed)
Requested medications are due for refill today.  Unsure   Requested medications are on the active medications list.  yes  Last refill. 04/15/2022 10.7 0 rf  Future visit scheduled.   yes  Notes to clinic.  Refill not assigned a protocol please review for refill.    Requested Prescriptions  Pending Prescriptions Disp Refills   BEVESPI AEROSPHERE 9-4.8 MCG/ACT AERO [Pharmacy Med Name: BEVESPI AEROSPHERE 9-4.8 MCG/ACT IN] 10.7 g 0    Sig: INHALE 2 PUFFS BY MOUTH INTO THE LUNGS 2TIMES DAILY     Off-Protocol Failed - 05/22/2022  2:07 PM      Failed - Medication not assigned to a protocol, review manually.      Passed - Valid encounter within last 12 months    Recent Outpatient Visits           4 months ago Vitamin D deficiency   [facility] Jon Billings, NP   5 months ago Osteoarthritis of spine with radiculopathy, lumbar region   [facility] Jon Billings, NP   7 months ago Acquired hypothyroidism   [facility] Jon Billings, NP   10 months ago Osteoarthritis of spine with radiculopathy, lumbar region   [place], Santiago Glad, NP   10 months ago Allergic rhinitis, unspecified seasonality, unspecified trigger   [facility] Jon Billings, NP       Future Appointments             In 1 month Jon Billings, NP [facility], [place]

## 2022-05-23 ENCOUNTER — Other Ambulatory Visit: Payer: Self-pay | Admitting: Nurse Practitioner

## 2022-05-23 NOTE — Telephone Encounter (Signed)
Requested medications are due for refill today.  unsure  Requested medications are on the active medications list.  yes  Last refill. 04/15/2022 10.7/0  Future visit scheduled.   yes  Notes to clinic.  Rx signed by Vernard Gambles. Medication not assigned a protocol.    Requested Prescriptions  Pending Prescriptions Disp Refills   BEVESPI AEROSPHERE 9-4.8 MCG/ACT AERO [Pharmacy Med Name: BEVESPI AEROSPHERE 9-4.8 MCG/ACT IN] 10.7 g 0    Sig: INHALE 2 PUFFS BY MOUTH INTO THE LUNGS 2TIMES DAILY     Off-Protocol Failed - 05/23/2022 12:42 PM      Failed - Medication not assigned to a protocol, review manually.      Passed - Valid encounter within last 12 months    Recent Outpatient Visits           4 months ago Vitamin D deficiency   [facility] Jon Billings, NP   5 months ago Osteoarthritis of spine with radiculopathy, lumbar region   [facility] Jon Billings, NP   7 months ago Acquired hypothyroidism   [facility] Jon Billings, NP   10 months ago Osteoarthritis of spine with radiculopathy, lumbar region   [place], Santiago Glad, NP   10 months ago Allergic rhinitis, unspecified seasonality, unspecified trigger   [facility] Jon Billings, NP       Future Appointments             In 1 month Jon Billings, NP [facility], [place]

## 2022-05-27 ENCOUNTER — Other Ambulatory Visit: Payer: Self-pay | Admitting: Pulmonary Disease

## 2022-05-29 ENCOUNTER — Telehealth: Payer: Self-pay

## 2022-05-29 NOTE — Telephone Encounter (Signed)
LM for patient to call office for pre virtual appointment questions.  

## 2022-05-31 DIAGNOSIS — M179 Osteoarthritis of knee, unspecified: Secondary | ICD-10-CM | POA: Diagnosis not present

## 2022-06-04 ENCOUNTER — Ambulatory Visit
Payer: Medicare Other | Attending: Student in an Organized Health Care Education/Training Program | Admitting: Student in an Organized Health Care Education/Training Program

## 2022-06-04 ENCOUNTER — Encounter: Payer: Self-pay | Admitting: Student in an Organized Health Care Education/Training Program

## 2022-06-04 DIAGNOSIS — G894 Chronic pain syndrome: Secondary | ICD-10-CM

## 2022-06-04 DIAGNOSIS — M47816 Spondylosis without myelopathy or radiculopathy, lumbar region: Secondary | ICD-10-CM

## 2022-06-04 DIAGNOSIS — G8929 Other chronic pain: Secondary | ICD-10-CM | POA: Diagnosis not present

## 2022-06-04 DIAGNOSIS — M533 Sacrococcygeal disorders, not elsewhere classified: Secondary | ICD-10-CM | POA: Diagnosis not present

## 2022-06-04 DIAGNOSIS — M48062 Spinal stenosis, lumbar region with neurogenic claudication: Secondary | ICD-10-CM | POA: Diagnosis not present

## 2022-06-04 DIAGNOSIS — M48061 Spinal stenosis, lumbar region without neurogenic claudication: Secondary | ICD-10-CM | POA: Diagnosis not present

## 2022-06-04 NOTE — Progress Notes (Signed)
Patient: Janet Murray  Service Category: E/M  Provider: Gillis Santa, MD  DOB: Sep 27, 1970  DOS: 06/04/2022  Location: Office  MRN: [id number]  Setting: Ambulatory outpatient  Referring Provider: Jon Billings, NP  Type: Established Patient  Specialty: Interventional Pain Management  PCP: Jon Billings, NP  Location: Remote location  Delivery: TeleHealth     Virtual Encounter - Pain Management PROVIDER NOTE: Information contained herein reflects review and annotations entered in association with encounter. Interpretation of such information and data should be left to medically-trained personnel. Information provided to patient can be located elsewhere in the medical record under "Patient Instructions". Document created using STT-dictation technology, any transcriptional errors that may result from process are unintentional.    Contact & Pharmacy Preferred: [phone] Home: [phone] (home) Mobile: [phone] (mobile) E-mail: [email or web address], [place] [address] [place] [address] Phone: [phone] Fax: [phone]   Pre-screening  Janet Murray offered "in-person" vs "virtual" encounter. She indicated preferring virtual for this encounter.   Reason COVID-19*  Social distancing based on CDC and AMA recommendations.   I contacted Janet Murray on 06/04/2022 via telephone.      I clearly identified myself as Gillis Santa, MD. I verified that I was speaking with the correct person using two identifiers (Name: Janet Murray, and date of birth: [birth year]).  Consent I sought verbal advanced consent from Janet Murray for virtual visit interactions. I informed Janet Murray of possible security and privacy concerns, risks, and limitations associated with providing "not-in-person" medical evaluation and management services. I also informed Janet Murray of the availability of "in-person" appointments. Finally, I  informed her that there would be a charge for the virtual visit and that she could be  personally, fully or partially, financially responsible for it. Janet Murray expressed understanding and agreed to proceed.   Historic Elements   Janet Murray is a 51 y.o. year old, female patient evaluated today after our last contact on 04/23/2022. Janet Murray  has a past medical history of Abdominal mass, RUQ (right upper quadrant), Anxiety, Arthritis, Asthma, Bronchitis, Cancer ([place]) (04/12/2021), Carpal tunnel syndrome of left wrist (10/25/2014), Chronic bronchitis, obstructive (07/20/2017), Chronic pain, COPD (chronic obstructive pulmonary disease) ([place]), Coronary artery disease, Cyst of right kidney (03/22/2018), DDD (degenerative disc disease), lumbar (10/25/2014), Degenerative joint disease (DJD) of hip, Diarrhea of infectious origin (03/25/2018), Dysphagia, Dyspnea, Emphysema of lung ([place]), Facet syndrome, lumbar (10/25/2014), Fatty liver (03/25/2018), Foot fracture, left (05/02/2015), 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 She also  has a past surgical history that includes Bladder surgery; Cesarean section; Appendectomy; Total abdominal hysterectomy w/ bilateral salpingoophorectomy; Esophagogastroduodenoscopy (04/26/2019); Stomach surgery; Colonoscopy with propofol (N/A, 04/11/2020); Thyroidectomy (N/A, 04/12/2021); and Colonoscopy with propofol  (N/A, 10/22/2021). Janet Murray has a current medication list which includes the following prescription(s): albuterol, albuterol, AMBULATORY NON FORMULARY MEDICATION, atorvastatin, bevespi aerosphere, diclofenac sodium, esomeprazole, hydroxyzine, levothyroxine, victoza, montelukast, naloxone, ondansetron, oxycodone, pramipexole, tizanidine, and vitamin d (ergocalciferol). She  reports that she has been smoking cigarettes. She has a 24.00 pack-year smoking history. She has never used smokeless tobacco. She  reports current alcohol use. She reports that she does not use drugs. Janet Murray is allergic to penicillins, acetaminophen, ciprofloxacin, aspirin, hydrocodone, and tape.  Estimated body mass index is 35.9 kg/m as calculated from the following:   Height as of 04/23/22: _0  (1.549 m).   Weight as of 04/23/22: 190 lb (86.2 kg).  HPI  Today, she is being contacted for a post-procedure assessment.   Post-procedure evaluation   Type: Lumbar epidural steroid injection (LESI) (interlaminar) #1    Laterality: Right   Level:  L4-5 Level.  Imaging: Fluoroscopic guidance         Anesthesia: Local anesthesia (1-2% Lidocaine) Anxiolysis: None                 DOS: 04/23/2022  Performed by: Gillis Santa, MD  Purpose: Diagnostic/Therapeutic Indications: Lumbar radicular pain of intraspinal etiology of more than 4 weeks that has failed to respond to conservative therapy and is severe enough to impact quality of life or function. 1. 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 Chronic pain syndrome    NAS-11 Pain score:   Pre-procedure: 8 /10   Post-procedure: 0-No pain/10      Effectiveness:  Initial hour after procedure: 100 %  Subsequent 4-6 hours post-procedure: 100 %  Analgesia past initial 6 hours: 20 %  Ongoing improvement:  Analgesic:  <20% Function: Back to baseline ROM: Back to baseline   Laboratory  Chemistry Profile   Renal Lab Results  Component Value Date   BUN 6 12/27/2021   CREATININE 0.57 12/27/2021   BCR 11 12/27/2021   GFRAA 127 01/19/2020   GFRNONAA >60 04/29/2021    Hepatic Lab Results  Component Value Date   AST 12 12/27/2021   ALT 8 12/27/2021   ALBUMIN 4.5 12/27/2021   ALKPHOS 100 12/27/2021   LIPASE 15 01/19/2020    Electrolytes Lab Results  Component Value Date   NA 141 12/27/2021   K 4.0 12/27/2021   CL 103 12/27/2021   CALCIUM 9.5 12/27/2021    Bone Lab Results  Component Value Date   VD25OH 10.1 (L) 12/27/2021    Inflammation (CRP: Acute Phase) (ESR: Chronic Phase) No results found for: "CRP", "ESRSEDRATE", "LATICACIDVEN"       Note: Above Lab results reviewed.  Assessment  The primary encounter diagnosis was Lumbar facet arthropathy. 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  Plan of Care  Patient continues to struggle with axial low back pain that radiates down into her right buttock.  Her lumbar epidural had limited response for her radicular pain.  Her lumbar MRI does show facet degenerative changes at L4-L5 and L5-S1.  I have offered her diagnostic lumbar facet medial branch nerve blocks on the right at L3, L4, L5.  Janet Murray has a history of greater than 3 months of moderate to severe pain which is resulted in functional impairment.  The patient has tried various conservative therapeutic options such as NSAIDs, Tylenol, muscle relaxants, physical therapy which was inadequately effective.  Patient's pain is predominantly axial with physical exam and MRI findings suggestive of right facet arthropathy. Lumbar facet medial branch nerve blocks were discussed with the patient.  Risks and benefits were reviewed.  Patient would like to proceed with RIGHT  L3, L4,  L5 medial  branch nerve block.  Orders Placed This Encounter  Procedures   LUMBAR FACET(MEDIAL BRANCH NERVE BLOCK) MBNB    Standing Status:   Future    Standing Expiration Date:   09/03/2022    Scheduling Instructions:     Procedure: Lumbar facet block (AKA.: Lumbosacral medial branch nerve block)     Side: RIGHT     Level: L3-4 & L5-S1 Facets (L3, L4, L5,  Medial Branch Nerves)     Sedation: Patient's choice.     Timeframe: ASAA    Order Specific Question:   Where will this procedure be performed?    Answer:   [facility]     Follow-up plan:   Return in about 2 weeks (around 06/18/2022) for Right L3,4,5 MBNB #1.     Right L4/5 ESI 04/23/22: 20-25% pain relief for less than a week Future considerations: R L3,4,5 MBNB; Right SI-J/piriformis, right hip bursa injection   Recent Visits Date Type Provider Dept  04/23/22 Procedure visit Gillis Santa, MD [facility]  03/27/22 Office Visit Gillis Santa, MD [facility]  Showing recent visits within past 90 days and meeting all other requirements Today's Visits Date Type Provider Dept  06/04/22 Office Visit Gillis Santa, MD [facility]  Showing today's visits and meeting all other requirements Future Appointments No visits were found meeting these conditions. Showing future appointments within next 90 days and meeting all other requirements  I discussed the assessment and treatment plan with the patient. The patient was provided an opportunity to ask questions and all were answered. The patient agreed with the plan and demonstrated an understanding of the instructions.  Patient advised to call back or seek an in-person evaluation if the symptoms or condition worsens.  Duration of encounter: 61mnutes.  Note by: BGillis Santa MD Date: 06/04/2022; Time: 1:39 PM

## 2022-06-05 NOTE — Patient Instructions (Signed)
______________________________________________________________________  Preparing for your procedure  During your procedure appointment there will be: No Prescription Refills. No disability issues to discussed. No medication changes or discussions.  Instructions: Food intake: Avoid eating anything solid for at least 8 hours prior to your procedure. Clear liquid intake: You may take clear liquids such as water up to 2 hours prior to your procedure. (No carbonated drinks. No soda.) Transportation: Unless otherwise stated by your physician, bring a driver. Morning Medicines: Except for blood thinners, take all of your other morning medications with a sip of water. Make sure to take your heart and blood pressure medicines. If your blood pressure's lower number is above 100, the case will be rescheduled. Blood thinners: If you take a blood thinner, but were not instructed to stop it, call our office [phone] and ask to talk to a nurse. Not stopping a blood thinner prior to certain procedures could lead to serious complications. Diabetics on insulin: Notify the staff so that you can be scheduled 1st case in the morning. If your diabetes requires high dose insulin, take only  of your normal insulin dose the morning of the procedure and notify the staff that you have done so. Preventing infections: Shower with an antibacterial soap the morning of your procedure.  Build-up your immune system: Take 1000 mg of Vitamin C with every meal (3 times a day) the day prior to your procedure. Antibiotics: Inform the nursing staff if you are taking any antibiotics or if you have any conditions that may require antibiotics prior to procedures. (Example: recent joint implants)   Pregnancy: If you are pregnant make sure to notify the nursing staff. Not doing so may result in injury to the fetus, including death.  Sickness: If you have a cold, fever, or any active infections, call and cancel or reschedule your  procedure. Receiving steroids while having an infection may result in complications. Arrival: You must be in the facility at least 30 minutes prior to your scheduled procedure. Tardiness: Your scheduled time is also the cutoff time. If you do not arrive at least 15 minutes prior to your procedure, you will be rescheduled.  Children: Do not bring any children with you. Make arrangements to keep them home. Dress appropriately: There is always a possibility that your clothing may get soiled. Avoid long dresses. Valuables: Do not bring any jewelry or valuables.  Reasons to call and reschedule or cancel your procedure: (Following these recommendations will minimize the risk of a serious complication.) Surgeries: Avoid having procedures within 2 weeks of any surgery. (Avoid for 2 weeks before or after any surgery). Flu Shots: Avoid having procedures within 2 weeks of a flu shots or . (Avoid for 2 weeks before or after immunizations). Barium: Avoid having a procedure within 7-10 days after having had a radiological study involving the use of radiological contrast. (Myelograms, Barium swallow or enema study). Heart attacks: Avoid any elective procedures or surgeries for the initial 6 months after a "Myocardial Infarction" (Heart Attack). Blood thinners: It is imperative that you stop these medications before procedures. Let us know if you if you take any blood thinner.  Infection: Avoid procedures during or within two weeks of an infection (including chest colds or gastrointestinal problems). Symptoms associated with infections include: Localized redness, fever, chills, night sweats or profuse sweating, burning sensation when voiding, cough, congestion, stuffiness, runny nose, sore throat, diarrhea, nausea, vomiting, cold or Flu symptoms, recent or current infections. It is specially important if the infection is   over the area that we intend to treat. Heart and lung problems: Symptoms that may suggest an  active cardiopulmonary problem include: cough, chest pain, breathing difficulties or shortness of breath, dizziness, ankle swelling, uncontrolled high or unusually low blood pressure, and/or palpitations. If you are experiencing any of these symptoms, cancel your procedure and contact your primary care physician for an evaluation.  Remember:  Regular Business hours are:  Monday to Thursday 8:00 AM to 4:00 PM  Provider's Schedule: Milinda Pointer, MD:  Procedure days: Tuesday and Thursday 7:30 AM to 4:00 PM  Gillis Santa, MD:  Procedure days: Monday and Wednesday 7:30 AM to 4:00 PM  ______________________________________________________________________

## 2022-06-06 ENCOUNTER — Other Ambulatory Visit (INDEPENDENT_AMBULATORY_CARE_PROVIDER_SITE_OTHER): Payer: Medicare Other

## 2022-06-06 DIAGNOSIS — E890 Postprocedural hypothyroidism: Secondary | ICD-10-CM | POA: Diagnosis not present

## 2022-06-06 DIAGNOSIS — E039 Hypothyroidism, unspecified: Secondary | ICD-10-CM

## 2022-06-06 LAB — TSH: TSH: 1.87 u[IU]/mL (ref 0.35–5.50)

## 2022-06-06 LAB — T4, FREE: Free T4: 1.2 ng/dL (ref 0.60–1.60)

## 2022-06-10 ENCOUNTER — Ambulatory Visit: Payer: Medicare Other | Admitting: Endocrinology

## 2022-06-10 DIAGNOSIS — G9050 Complex regional pain syndrome I, unspecified: Secondary | ICD-10-CM | POA: Diagnosis not present

## 2022-06-24 ENCOUNTER — Encounter: Payer: Self-pay | Admitting: Nurse Practitioner

## 2022-06-24 ENCOUNTER — Ambulatory Visit: Payer: Self-pay

## 2022-06-24 ENCOUNTER — Ambulatory Visit (INDEPENDENT_AMBULATORY_CARE_PROVIDER_SITE_OTHER): Payer: 59 | Admitting: Nurse Practitioner

## 2022-06-24 VITALS — BP 122/77 | HR 67 | Temp 99.7°F

## 2022-06-24 DIAGNOSIS — R300 Dysuria: Secondary | ICD-10-CM | POA: Diagnosis not present

## 2022-06-24 DIAGNOSIS — B9689 Other specified bacterial agents as the cause of diseases classified elsewhere: Secondary | ICD-10-CM | POA: Diagnosis not present

## 2022-06-24 DIAGNOSIS — N760 Acute vaginitis: Secondary | ICD-10-CM | POA: Diagnosis not present

## 2022-06-24 DIAGNOSIS — N3001 Acute cystitis with hematuria: Secondary | ICD-10-CM | POA: Diagnosis not present

## 2022-06-24 LAB — URINALYSIS, ROUTINE W REFLEX MICROSCOPIC
Bilirubin, UA: NEGATIVE
Glucose, UA: NEGATIVE
Nitrite, UA: POSITIVE — AB
Specific Gravity, UA: 1.015 (ref 1.005–1.030)
Urobilinogen, Ur: 1 mg/dL (ref 0.2–1.0)
pH, UA: 5.5 (ref 5.0–7.5)

## 2022-06-24 LAB — MICROSCOPIC EXAMINATION

## 2022-06-24 LAB — WET PREP FOR TRICH, YEAST, CLUE
Clue Cell Exam: POSITIVE — AB
Trichomonas Exam: NEGATIVE
Yeast Exam: NEGATIVE

## 2022-06-24 MED ORDER — METRONIDAZOLE 500 MG PO TABS: 500.0000 mg | ORAL_TABLET | Freq: Two times a day (BID) | ORAL | 0 refills | Status: AC

## 2022-06-24 NOTE — Telephone Encounter (Signed)
  Chief Complaint: Pressure after urinating. Symptoms: Pressure after urinating Frequency: Friday Pertinent Negatives: Patient denies Frequency/ urgency Disposition: '[]'$ ED /'[]'$ Urgent Care (no appt availability in office) / '[]'$ Appointment(In office/virtual)/ '[]'$  [facility]/ '[]'$ Home Care/ '[x]'$ Refused Recommended Disposition /'[]'$ [facility]/ '[]'$  Follow-up with PCP Additional Notes: PT states that she has a UTI and that Santiago Glad calls in antibiotic for same. Pt will only see Santiago Glad. Pt will not go to UC for care. PT is requesting that Santiago Glad call in an antibiotic. PT has an appt on 1/22. PT states she will just wait to see Santiago Glad if no Rx is called in.  Please advise.

## 2022-06-24 NOTE — Telephone Encounter (Signed)
Patient will need an appointment but I will see her sooner than 1/22 if she wants. I can see her tomorrow at 1020.

## 2022-06-24 NOTE — Progress Notes (Signed)
BP 122/77   Pulse 67   Temp 99.7 F (37.6 C) (Oral)   SpO2 98%    Subjective:    Patient ID: Janet Murray, female    DOB: 12/28/1970, 52 y.o.   MRN: [id number]  HPI: Janet Murray is a 52 y.o. female  Chief Complaint  Patient presents with   Urinary Tract Infection    Pt states she has been having urinary pressure since Friday.    URINARY SYMPTOMS Dysuria: yes- pressure at the end of urination Urinary frequency: yes Urgency: yes Small volume voids: no Symptom severity: no Urinary incontinence: no Foul odor: no Hematuria: no Abdominal pain: no Back pain: no Suprapubic pain/pressure: no Flank pain: no Fever:  no Vomiting: no Relief with cranberry juice: no Relief with pyridium: no Status: better/worse/stable Previous urinary tract infection: no Recurrent urinary tract infection: no History of sexually transmitted disease: no Penile discharge: no Treatments attempted: increasing fluids   Relevant past medical, surgical, family and social history reviewed and updated as indicated. Interim medical history since our last visit reviewed. Allergies and medications reviewed and updated.  Review of Systems  Constitutional:  Negative for fever.  Gastrointestinal:  Negative for abdominal pain and vomiting.  Genitourinary:  Positive for dysuria, frequency and urgency. Negative for decreased urine volume, flank pain and hematuria.  Musculoskeletal:  Negative for back pain.    Per HPI unless specifically indicated above     Objective:    BP 122/77   Pulse 67   Temp 99.7 F (37.6 C) (Oral)   SpO2 98%   Wt Readings from Last 3 Encounters:  04/23/22 190 lb (86.2 kg)  04/10/22 196 lb 12.8 oz (89.3 kg)  03/27/22 195 lb 8 oz (88.7 kg)    Physical Exam Vitals and nursing note reviewed.  Constitutional:      General: She is not in acute distress.    Appearance: Normal appearance. She is normal weight. She is not ill-appearing, toxic-appearing or diaphoretic.  HENT:      Head: Normocephalic.     Right Ear: External ear normal.     Left Ear: External ear normal.     Nose: Nose normal.     Mouth/Throat:     Mouth: Mucous membranes are moist.     Pharynx: Oropharynx is clear.  Eyes:     General:        Right eye: No discharge.        Left eye: No discharge.     Extraocular Movements: Extraocular movements intact.     Conjunctiva/sclera: Conjunctivae normal.     Pupils: Pupils are equal, round, and reactive to light.  Cardiovascular:     Rate and Rhythm: Normal rate and regular rhythm.     Heart sounds: No murmur heard. Pulmonary:     Effort: Pulmonary effort is normal. No respiratory distress.     Breath sounds: Normal breath sounds. No wheezing or rales.  Musculoskeletal:     Cervical back: Normal range of motion and neck supple.  Skin:    General: Skin is warm and dry.     Capillary Refill: Capillary refill takes less than 2 seconds.  Neurological:     General: No focal deficit present.     Mental Status: She is alert and oriented to person, place, and time. Mental status is at baseline.  Psychiatric:        Mood and Affect: Mood normal.        Behavior: Behavior normal.  Thought Content: Thought content normal.        Judgment: Judgment normal.     Results for orders placed or performed in visit on 06/06/22  TSH  Result Value Ref Range   TSH 1.87 0.35 - 5.50 uIU/mL  T4, free  Result Value Ref Range   Free T4 1.20 0.60 - 1.60 ng/dL      Assessment & Plan:   Problem List Items Addressed This Visit   None Visit Diagnoses     Acute cystitis with hematuria    -  Primary   Will treat with flagyl due to BV.  If Not completely resolved with Flagyl will treat with bactrim.   Relevant Orders   Urine Culture   Bacterial vaginosis       Will treat with Flagyl.  Follow up if not improved.   Relevant Medications   metroNIDAZOLE (FLAGYL) 500 MG tablet   Dysuria       Relevant Orders   Urinalysis, Routine w reflex microscopic    WET PREP FOR TRICH, YEAST, CLUE        Follow up plan: No follow-ups on file.

## 2022-06-24 NOTE — Telephone Encounter (Signed)
Summary: bladder infection   Pt stsed she has bladder infection from medication and has pressure when urinating and asked for Bactrim to be called in/ please advise     Reason for Disposition  Urinating more frequently than usual (i.e., frequency)  Answer Assessment - Initial Assessment Questions 1. SYMPTOM: "What's the main symptom you're concerned about?" (e.g., frequency, incontinence)     Pressure /pain after urinating 2. ONSET: "When did the  s/s  start?"     Friday 3. PAIN: "Is there any pain?" If Yes, ask: "How bad is it?" (Scale: 1-10; mild, moderate, severe)      4. CAUSE: "What do you think is causing the symptoms?"     UTI 5. OTHER SYMPTOMS: "Do you have any other symptoms?" (e.g., blood in urine, fever, flank pain, pain with urination)     no 6. PREGNANCY: "Is there any chance you are pregnant?" "When was your last menstrual period?"  Protocols used: Urinary Symptoms-A-AH

## 2022-06-24 NOTE — Telephone Encounter (Signed)
Pt scheduled  

## 2022-06-25 ENCOUNTER — Ambulatory Visit
Payer: Medicare Other | Attending: Student in an Organized Health Care Education/Training Program | Admitting: Student in an Organized Health Care Education/Training Program

## 2022-06-25 DIAGNOSIS — N3001 Acute cystitis with hematuria: Secondary | ICD-10-CM | POA: Diagnosis not present

## 2022-06-25 NOTE — Progress Notes (Signed)
Results discussed with patient during visit.

## 2022-06-28 ENCOUNTER — Encounter: Payer: Self-pay | Admitting: Emergency Medicine

## 2022-06-28 ENCOUNTER — Emergency Department
Admission: EM | Admit: 2022-06-28 | Discharge: 2022-06-28 | Disposition: A | Discharge status: Home / Self Care | Payer: 59 | Attending: Emergency Medicine | Admitting: Emergency Medicine

## 2022-06-28 ENCOUNTER — Emergency Department: Payer: 59

## 2022-06-28 ENCOUNTER — Other Ambulatory Visit: Payer: Self-pay

## 2022-06-28 DIAGNOSIS — N3289 Other specified disorders of bladder: Secondary | ICD-10-CM | POA: Diagnosis not present

## 2022-06-28 DIAGNOSIS — N12 Tubulo-interstitial nephritis, not specified as acute or chronic: Secondary | ICD-10-CM | POA: Insufficient documentation

## 2022-06-28 DIAGNOSIS — J449 Chronic obstructive pulmonary disease, unspecified: Secondary | ICD-10-CM | POA: Diagnosis not present

## 2022-06-28 DIAGNOSIS — N3090 Cystitis, unspecified without hematuria: Secondary | ICD-10-CM | POA: Diagnosis not present

## 2022-06-28 DIAGNOSIS — N281 Cyst of kidney, acquired: Secondary | ICD-10-CM | POA: Diagnosis not present

## 2022-06-28 DIAGNOSIS — R300 Dysuria: Secondary | ICD-10-CM | POA: Diagnosis present

## 2022-06-28 LAB — COMPREHENSIVE METABOLIC PANEL
ALT: 9 U/L (ref 0–44)
AST: 16 U/L (ref 15–41)
Albumin: 4 g/dL (ref 3.5–5.0)
Alkaline Phosphatase: 76 U/L (ref 38–126)
Anion gap: 9 (ref 5–15)
BUN: 7 mg/dL (ref 6–20)
CO2: 25 mmol/L (ref 22–32)
Calcium: 8.9 mg/dL (ref 8.9–10.3)
Chloride: 100 mmol/L (ref 98–111)
Creatinine, Ser: 0.66 mg/dL (ref 0.44–1.00)
GFR, Estimated: >60 mL/min (ref >60)
Glucose, Bld: 109 mg/dL — ABNORMAL HIGH (ref 70–99)
Potassium: 3.3 mmol/L — ABNORMAL LOW (ref 3.5–5.1)
Sodium: 134 mmol/L — ABNORMAL LOW (ref 135–145)
Total Bilirubin: 0.4 mg/dL (ref 0.3–1.2)
Total Protein: 7 g/dL (ref 6.5–8.1)

## 2022-06-28 LAB — URINALYSIS, ROUTINE W REFLEX MICROSCOPIC
Bilirubin Urine: NEGATIVE
Glucose, UA: NEGATIVE mg/dL
Ketones, ur: NEGATIVE mg/dL
Nitrite: NEGATIVE
Protein, ur: >=300 mg/dL — AB
RBC / HPF: >50 RBC/hpf — ABNORMAL HIGH (ref 0–5)
Specific Gravity, Urine: 1.011 (ref 1.005–1.030)
WBC, UA: >50 WBC/hpf — ABNORMAL HIGH (ref 0–5)
pH: 5 (ref 5.0–8.0)

## 2022-06-28 LAB — CBC
HCT: 39.9 % (ref 36.0–46.0)
Hemoglobin: 13.6 g/dL (ref 12.0–15.0)
MCH: 34.1 pg — ABNORMAL HIGH (ref 26.0–34.0)
MCHC: 34.1 g/dL (ref 30.0–36.0)
MCV: 100 fL (ref 80.0–100.0)
Platelets: 90 10*3/uL — ABNORMAL LOW (ref 150–400)
RBC: 3.99 MIL/uL (ref 3.87–5.11)
RDW: 12.4 % (ref 11.5–15.5)
WBC: 9 10*3/uL (ref 4.0–10.5)
nRBC: 0 % (ref 0.0–0.2)

## 2022-06-28 LAB — URINE CULTURE

## 2022-06-28 LAB — LIPASE, BLOOD: Lipase: 25 U/L (ref 11–51)

## 2022-06-28 LAB — WET PREP, GENITAL
Clue Cells Wet Prep HPF POC: NONE SEEN
Sperm: NONE SEEN
Trich, Wet Prep: NONE SEEN
WBC, Wet Prep HPF POC: <10 (ref <10)
Yeast Wet Prep HPF POC: NONE SEEN

## 2022-06-28 LAB — CHLAMYDIA/NGC RT PCR (ARMC ONLY)
Chlamydia Tr: NOT DETECTED
N gonorrhoeae: NOT DETECTED

## 2022-06-28 MED ORDER — SODIUM CHLORIDE 0.9 % IV BOLUS
1000.0000 mL | Freq: Once | INTRAVENOUS | Status: AC
  Administered 2022-06-28: 1000 mL via INTRAVENOUS

## 2022-06-28 MED ORDER — KETOROLAC TROMETHAMINE 15 MG/ML IJ SOLN
15.0000 mg | Freq: Once | INTRAMUSCULAR | Status: AC
  Administered 2022-06-28: 15 mg via INTRAVENOUS
  Filled 2022-06-28: qty 1

## 2022-06-28 MED ORDER — IOHEXOL 300 MG/ML  SOLN
100.0000 mL | Freq: Once | INTRAMUSCULAR | Status: AC | PRN
  Administered 2022-06-28: 100 mL via INTRAVENOUS

## 2022-06-28 MED ORDER — CEFDINIR 300 MG PO CAPS: 300.0000 mg | ORAL_CAPSULE | Freq: Two times a day (BID) | ORAL | 0 refills | Status: AC

## 2022-06-28 MED ORDER — SODIUM CHLORIDE 0.9 % IV SOLN
1.0000 g | INTRAVENOUS | Status: DC
  Administered 2022-06-28: 1 g via INTRAVENOUS
  Filled 2022-06-28: qty 10

## 2022-06-28 NOTE — ED Provider Notes (Signed)
[facility] Provider Note    Event Date/Time   First MD Initiated Contact with Patient 06/28/22 1022     (approximate)   History   No chief complaint on file.   HPI  Janet Murray is a 52 y.o. female with a past medical history of chronic pain syndrome, hypercholesterolemia, obesity, COPD who presents today for evaluation of abdominal pain and dysuria.  Patient reports that her symptoms began approximately 5 days ago, but has since moved to her right flank.  She denies nausea or vomiting.  Patient reports that she went to her primary care provider and she was diagnosed with BV and started on Flagyl.  She was not given any medicine for urinary tract infection.  She denies history of kidney stones.  No fevers or chills.  No nausea or vomiting.  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   High cholesterol 01/09/2021   Coronary artery disease 11/09/2020   Special screening for malignant neoplasms, colon    Polyp of colon    Chronic right SI joint pain 04/02/2020   Trochanteric bursitis of right hip 04/02/2020   COPD (chronic obstructive pulmonary disease) with emphysema ([place]) 12/13/2019   Tobacco abuse 12/13/2019   Acquired hypothyroidism 11/01/2019   Panic attack 11/01/2019   Urinary incontinence    Obesity 10/26/2019   Diarrhea of infectious origin 03/25/2018   Fatty liver 03/25/2018   History of alcohol use 03/25/2018   Cyst of right kidney 03/22/2018   Chronic nausea 02/19/2018   Thrombocytopenia ([place]) 02/19/2018   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   Abdominal pain, RUQ (right upper quadrant) 10/10/2017   Chronic  bronchitis, obstructive 07/20/2017   Other dysphagia 04/20/2017   Other irritable bowel syndrome 04/20/2017   Migraine 02/04/2016   Lumbar radiculopathy 11/08/2014   Radiculopathy of lumbar region 11/08/2014   Hereditary and idiopathic neuropathy 11/06/2014   Osteoarthritis of spine with radiculopathy, lumbar region 10/25/2014   Lumbar facet arthropathy 10/25/2014   Sacroiliac joint dysfunction of both sides 10/25/2014   Carpal tunnel syndrome of left wrist 10/25/2014   Occipital neuralgia 10/25/2014   Restless leg syndrome 10/25/2014   Other intervertebral disc degeneration, lumbar region 10/25/2014   Other spondylosis with radiculopathy, lumbar region 10/25/2014   Low back pain 10/25/2014   Sacrococcygeal disorders, not elsewhere classified 10/25/2014          Physical Exam   Triage Vital Signs: ED Triage Vitals  Enc Vitals Group     BP 06/28/22 1000 138/78     Pulse Rate 06/28/22 1000 77     Resp 06/28/22 1000 20     Temp 06/28/22 1000 98 F (36.7 C)     Temp src --      SpO2 06/28/22 1000 99 %     Weight 06/28/22 0957 185 lb (83.9 kg)     Height 06/28/22 0957 '5\' 1"'$  (1.549 m)     Head Circumference --      Peak Flow --      Pain Score 06/28/22 0957 9     Pain Loc --      Pain Edu? --      Excl. in [place]? --  Most recent vital signs: Vitals:   06/28/22 1000 06/28/22 1336  BP: 138/78 136/84  Pulse: 77 81  Resp: 20 16  Temp: 98 F (36.7 C) 98.5 F (36.9 C)  SpO2: 99% 96%    Physical Exam Vitals and nursing note reviewed.  Constitutional:      General: Awake and alert. No acute distress.    Appearance: Normal appearance. The patient is overweight.  HENT:     Head: Normocephalic and atraumatic.     Mouth: Mucous membranes are moist.  Eyes:     General: PERRL. Normal EOMs        Right eye: No discharge.        Left eye: No discharge.     Conjunctiva/sclera: Conjunctivae normal.  Cardiovascular:     Rate and Rhythm: Normal rate and regular rhythm.      Pulses: Normal pulses.     Heart sounds: Normal heart sounds Pulmonary:     Effort: Pulmonary effort is normal. No respiratory distress.     Breath sounds: Normal breath sounds.  Abdominal:     Abdomen is soft. There is mild suprapubic abdominal tenderness abdominal tenderness. No rebound or guarding. No distention. Pelvic exam: Normal-appearing genitalia.  No discharge noted.  No cervical motion tenderness.  No adnexal tenderness. Musculoskeletal:        General: No swelling. Normal range of motion.     Cervical back: Normal range of motion and neck supple.  Skin:    General: Skin is warm and dry.     Capillary Refill: Capillary refill takes less than 2 seconds.     Findings: No rash.  Neurological:     Mental Status: The patient is awake and alert.      ED Results / Procedures / Treatments   Labs (all labs ordered are listed, but only abnormal results are displayed) Labs Reviewed  COMPREHENSIVE METABOLIC PANEL - Abnormal; Notable for the following components:      Result Value   Sodium 134 (*)    Potassium 3.3 (*)    Glucose, Bld 109 (*)    All other components within normal limits  CBC - Abnormal; Notable for the following components:   MCH 34.1 (*)    Platelets 90 (*)    All other components within normal limits  URINALYSIS, ROUTINE W REFLEX MICROSCOPIC - Abnormal; Notable for the following components:   Color, Urine AMBER (*)    APPearance TURBID (*)    Hgb urine dipstick MODERATE (*)    Protein, ur >=300 (*)    Leukocytes,Ua LARGE (*)    RBC / HPF >50 (*)    WBC, UA >50 (*)    Bacteria, UA MANY (*)    All other components within normal limits  WET PREP, GENITAL  CHLAMYDIA/NGC RT PCR ([facility] ONLY)            LIPASE, BLOOD     EKG     RADIOLOGY I independently reviewed and interpreted imaging and agree with radiologists findings.     PROCEDURES:  Critical Care performed:   Procedures   MEDICATIONS ORDERED IN ED: Medications  cefTRIAXone  (ROCEPHIN) 1 g in sodium chloride 0.9 % 100 mL IVPB (0 g Intravenous Stopped 06/28/22 1323)  sodium chloride 0.9 % bolus 1,000 mL (0 mLs Intravenous Stopped 06/28/22 1405)  iohexol (OMNIPAQUE) 300 MG/ML solution 100 mL (100 mLs Intravenous Contrast Given 06/28/22 1252)  ketorolac (TORADOL) 15 MG/ML injection 15 mg (15 mg Intravenous Given 06/28/22 1323)  IMPRESSION / MDM / ASSESSMENT AND PLAN / ED COURSE  I reviewed the triage vital signs and the nursing notes.   Differential diagnosis includes, but is not limited to, pyelonephritis, tubo-ovarian abscess, nephrolithiasis, pelvic inflammatory disease.  Patient is awake and alert, hemodynamically stable and afebrile.  She has mild suprapubic discomfort.  Pelvic exam is without discharge or cervical motion tenderness, I do not suspect PID or tubo-ovarian abscess.  Swabs were obtained, and wet prep is negative, it appears that her BV has been successfully treated.  With the patient's chart.  Patient was also diagnosed with a urinary tract infection, however was not prescribed antibiotics for her UTI.  Further workup is indicated.  IV was established and labs were obtained which were overall reassuring.  Her urinalysis does reveal evidence of infection.  Given her flank pain, CT scan was obtained for evaluation of possible concurrent kidney stone or pyelonephritis.  Patient was treated symptomatically with improvement of her symptoms.  CT scan is consistent with cystitis without stone.  Patient was treated with Rocephin which she tolerated well, and was discharged on cefdinir.  We discussed symptomatic management and return precautions.  Patient understands and agrees with plan.  She was discharged in stable condition.   Patient's presentation is most consistent with acute complicated illness / injury requiring diagnostic workup.    FINAL CLINICAL IMPRESSION(S) / ED DIAGNOSES   Final diagnoses:  Cystitis  Pyelonephritis     Rx / DC Orders    ED Discharge Orders          Ordered    cefdinir (OMNICEF) 300 MG capsule  2 times daily        06/28/22 1349             Note:  This document was prepared using Dragon voice recognition software and may include unintentional dictation errors.   Emeline Gins 06/28/22 1412    Blake Divine, MD 06/28/22 1432

## 2022-06-28 NOTE — Discharge Instructions (Addendum)
You were found to have a urinary tract infection that is involving your ureter.  You were given your first dose of antibiotics in the emergency department.  Please take the remaining antibiotics for the next 10 days.  Please return for any new, worsening, or change in symptoms other concerns.  It was a pleasure caring for you today.

## 2022-06-28 NOTE — ED Triage Notes (Signed)
Pt via POV from home. Pt was seen at her PCP on 1/16 and was dx with BV and cystitis. Pt states that she was placed on Flagyl. States that she is having pressure when she urinates and flank and RLQ abd pain. States that since the abx the pain. Pt is A&OX4 and NAD

## 2022-06-30 ENCOUNTER — Ambulatory Visit: Payer: Medicare Other | Admitting: Pulmonary Disease

## 2022-06-30 ENCOUNTER — Encounter: Payer: 59 | Admitting: Nurse Practitioner

## 2022-06-30 DIAGNOSIS — M179 Osteoarthritis of knee, unspecified: Secondary | ICD-10-CM | POA: Diagnosis not present

## 2022-06-30 NOTE — Progress Notes (Deleted)
There were no vitals taken for this visit.   Subjective:    Patient ID: Janet Murray, female    DOB: 23-May-1971, 52 y.o.   MRN: [id number]  HPI: Janet Murray is a 52 y.o. female presenting on 06/30/2022 for comprehensive medical examination. Current medical complaints include:{Blank single:19197::"none","***"}  She currently lives with: Menopausal Symptoms: {Blank single:19197::"yes","no"}  HYPERLIPIDEMIA Hyperlipidemia status: {Blank single:19197::"excellent compliance","good compliance","fair compliance","poor compliance"} Satisfied with current treatment?  {Blank single:19197::"yes","no"} Side effects:  {Blank single:19197::"yes","no"} Medication compliance: {Blank single:19197::"excellent compliance","good compliance","fair compliance","poor compliance"} Past cholesterol meds: {Blank multiple:19196::"none","atorvastain (lipitor)","lovastatin (mevacor)","pravastatin (pravachol)","rosuvastatin (crestor)","simvastatin (zocor)","vytorin","fenofibrate (tricor)","gemfibrozil","ezetimide (zetia)","niaspan","lovaza"} Supplements: {Blank multiple:19196::"none","fish oil","niacin","red yeast rice"} Aspirin:  {Blank single:19197::"yes","no"} The 10-year ASCVD risk score (Arnett DK, et al., 2019) is: 8.7%   Values used to calculate the score:     Age: 38 years     Sex: Female     Is Non-Hispanic African American: No     Diabetic: Yes     Tobacco smoker: Yes     Systolic Blood Pressure: [id number] mmHg     Is BP treated: No     HDL Cholesterol: 51 mg/dL     Total Cholesterol: 200 mg/dL Chest pain:  {Blank single:19197::"yes","no"} Coronary artery disease:  {Blank single:19197::"yes","no"} Family history CAD:  {Blank single:19197::"yes","no"} Family history early CAD:  {Blank single:19197::"yes","no"}  COPD COPD status: {Blank single:19197::"controlled","uncontrolled","better","worse","exacerbated","stable"} Satisfied with current treatment?: {Blank single:19197::"yes","no"} Oxygen use:  {Blank single:19197::"yes","no"} Dyspnea frequency:  Cough frequency:  Rescue inhaler frequency:   Limitation of activity: {Blank single:19197::"yes","no"} Productive cough:  Last Spirometry:  Pneumovax: {Blank single:19197::"Up to Date","Not up to Date","unknown"} Influenza: {Blank single:19197::"Up to Date","Not up to Date","unknown"}  HYPOTHYROIDISM Thyroid control status:{Blank single:19197::"controlled","uncontrolled","better","worse","exacerbated","stable"} Satisfied with current treatment? {Blank single:19197::"yes","no"} Medication side effects: {Blank single:19197::"yes","no"} Medication compliance: {Blank single:19197::"excellent compliance","good compliance","fair compliance","poor compliance"} Etiology of hypothyroidism:  Recent dose adjustment:{Blank single:19197::"yes","no"} Fatigue: {Blank single:19197::"yes","no"} Cold intolerance: {Blank single:19197::"yes","no"} Heat intolerance: {Blank single:19197::"yes","no"} Weight gain: {Blank single:19197::"yes","no"} Weight loss: {Blank single:19197::"yes","no"} Constipation: {Blank single:19197::"yes","no"} Diarrhea/loose stools: {Blank single:19197::"yes","no"} Palpitations: {Blank single:19197::"yes","no"} Lower extremity edema: {Blank single:19197::"yes","no"} Anxiety/depressed mood: {Blank single:19197::"yes","no"}   Functional Status Survey:       06/24/2022    4:10 PM 04/23/2022    1:34 PM 03/27/2022   12:47 PM 03/04/2022   11:04 AM 12/27/2021    1:33 PM  Fall Risk   Falls in the past year? 0 0 0 0 1  Number falls in past yr: 0   0 1  Injury with Fall? 0   0 1  Risk for fall due to : No Fall Risks    History of fall(s)  Follow up Falls evaluation completed    Falls evaluation completed    Depression Screen    06/24/2022    4:11 PM 04/23/2022    1:34 PM 12/27/2021    1:33 PM 12/13/2021    2:25 PM 09/27/2021    1:19 PM  Depression screen PHQ 2/9  Decreased Interest 1 0 0 1 1  Down, Depressed, Hopeless 0 0  0 1 0  PHQ - 2 Score 1 0 0 2 1  Altered sleeping 1  1 2 1  $ Tired, decreased energy 1  1 1 1  $ Change in appetite 1  0 0 0  Feeling bad or failure about yourself  0  0 0 0  Trouble concentrating 0  0 0 0  Moving slowly or fidgety/restless 0  0 0 0  Suicidal thoughts 0  0 0 0  PHQ-9 Score 4  2 5 $ 3  Difficult doing work/chores Not difficult at all  Not difficult at all Not difficult at all Not difficult at all     Advanced Directives Does patient have a HCPOA?    {Blank single:19197::"yes","no"} If yes, name and contact information:  Does patient have a living will or MOST form?  {Blank single:19197::"yes","no"}  Past Medical History:  Past Medical History:  Diagnosis Date   Abdominal mass, RUQ (right upper quadrant)    Anxiety    Arthritis    Asthma    Bronchitis    Cancer ([place]) 04/12/2021   Carpal tunnel syndrome of left wrist 10/25/2014   Chronic bronchitis, obstructive 07/20/2017   Chronic pain    COPD (chronic obstructive pulmonary disease) (HCC)    Coronary artery disease    Cyst of right kidney 03/22/2018   DDD (degenerative disc disease), lumbar 10/25/2014   Degenerative joint disease (DJD) of hip    Diarrhea of infectious origin 03/25/2018   Dysphagia    Dyspnea    Emphysema of lung (HCC)    Facet syndrome, lumbar 10/25/2014   Fatty liver 03/25/2018   Foot fracture, left 05/02/2015   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   GERD (gastroesophageal reflux disease)    Hereditary and idiopathic peripheral neuropathy 11/06/2014   Hypothyroidism    IBS (irritable bowel syndrome)    Low back pain    Migraine 02/04/2016   Occipital neuralgia    Osteoarthritis of spine with radiculopathy, lumbar region 10/25/2014   Other intervertebral disc degeneration, lumbar region    Other irritable bowel syndrome 04/20/2017   Other spondylosis with radiculopathy, lumbar region 10/25/2014   RLS (restless legs syndrome)    Sacroiliac joint dysfunction of both sides 10/25/2014    Spondylosis of lumbar spine    Urinary incontinence     Surgical History:  Past Surgical History:  Procedure Laterality Date   APPENDECTOMY     BLADDER SURGERY     sling - Dr. Davis Gourd   CESAREAN SECTION     COLONOSCOPY WITH PROPOFOL N/A 04/11/2020   Procedure: COLONOSCOPY WITH PROPOFOL;  Surgeon: Virgel Manifold, MD;  Location: [facility] ENDOSCOPY;  Service: Endoscopy;  Laterality: N/A;   COLONOSCOPY WITH PROPOFOL N/A 10/22/2021   Procedure: COLONOSCOPY WITH PROPOFOL;  Surgeon: Lin Landsman, MD;  Location: [facility] ENDOSCOPY;  Service: Gastroenterology;  Laterality: N/A;   ESOPHAGOGASTRODUODENOSCOPY  04/26/2019   STOMACH SURGERY     THYROIDECTOMY N/A 04/12/2021   Procedure: THYROIDECTOMY, total;  Surgeon: Jules Husbands, MD;  Location: [facility] ORS;  Service: General;  Laterality: N/A;   TOTAL ABDOMINAL HYSTERECTOMY W/ BILATERAL SALPINGOOPHORECTOMY      Medications:  Current Outpatient Medications on File Prior to Visit  Medication Sig   albuterol (PROVENTIL) (2.5 MG/3ML) 0.083% nebulizer solution Take 3 mLs (2.5 mg total) by nebulization every 6 (six) hours as needed for wheezing or shortness of breath.   albuterol (VENTOLIN HFA) 108 (90 Base) MCG/ACT inhaler Inhale 2 puffs into the lungs every 6 (six) hours as needed for wheezing or shortness of breath.   AMBULATORY NON FORMULARY MEDICATION Medication Name: nebulizer DX: J44.9   atorvastatin (LIPITOR) 80 MG tablet Take 1 tablet (80 mg total) by mouth daily.   BEVESPI AEROSPHERE 9-4.8 MCG/ACT AERO INHALE 2 PUFFS BY MOUTH INTO THE LUNGS 2TIMES DAILY   cefdinir (OMNICEF) 300 MG capsule Take 1 capsule (300 mg total) by mouth 2 (two) times daily for 10 days.   diclofenac Sodium (VOLTAREN) 1 % GEL Apply 1 application topically daily as needed (Knee  pain).   esomeprazole (NEXIUM) 40 MG capsule TAKE ONE CAPSULE BY MOUTH TWICE A DAY   hydrOXYzine (ATARAX/VISTARIL) 25 MG tablet Take 1 tablet (25 mg total) by mouth 3 (three) times daily as  needed. (Patient taking differently: Take 25 mg by mouth 3 (three) times daily as needed for anxiety.)   levothyroxine (SYNTHROID) 175 MCG tablet Take 1 tablet (175 mcg total) by mouth daily.   liraglutide (VICTOZA) 18 MG/3ML SOPN INJECT 1.2 MG INTO THE SKIN DAILY   metroNIDAZOLE (FLAGYL) 500 MG tablet Take 1 tablet (500 mg total) by mouth 2 (two) times daily for 7 days.   montelukast (SINGULAIR) 10 MG tablet TAKE 1 TABLET BY MOUTH AT BEDTIME   naloxone (NARCAN) nasal spray 4 mg/0.1 mL SMARTSIG:Both Nares   ondansetron (ZOFRAN-ODT) 4 MG disintegrating tablet TAKE 1 TABLET BY MOUTH EVERY 8 HOURS AS NEEDED   oxyCODONE (OXY IR/ROXICODONE) 5 MG immediate release tablet Take 1 tablet (5 mg total) by mouth every 4 (four) hours as needed for severe pain.   pramipexole (MIRAPEX) 0.5 MG tablet TAKE ONE TABLET BY MOUTH THREE TIMES A DAY   tiZANidine (ZANAFLEX) 2 MG tablet Take 2 mg by mouth 2 (two) times daily.   Vitamin D, Ergocalciferol, (DRISDOL) 1.25 MG (50000 UNIT) CAPS capsule Take 1 capsule (50,000 Units total) by mouth every 7 (seven) days.   No current facility-administered medications on file prior to visit.    Allergies:  Allergies  Allergen Reactions   Penicillins Hives and Swelling    Has patient had a PCN reaction causing immediate rash, facial/tongue/throat swelling, SOB or lightheadedness with hypotension: Yes Has patient had a PCN reaction causing severe rash involving mucus membranes or skin necrosis: No Has patient had a PCN reaction that required hospitalization No Has patient had a PCN reaction occurring within the last 10 years: No If all of the above answers are "NO", then may proceed with Cephalosporin use.   Acetaminophen     Due to polyps    Ciprofloxacin Itching   Aspirin Palpitations    Heart palpitations   Hydrocodone Hives and Rash   Tape Rash    Per patient "clear tape"    Social History:  Social History   Socioeconomic History   Marital status: Single     Spouse name: Not on file   Number of children: 2   Years of education: Not on file   Highest education level: Not on file  Occupational History   Not on file  Tobacco Use   Smoking status: Every Day    Packs/day: 0.75    Years: 32.00    Total pack years: 24.00    Types: Cigarettes   Smokeless tobacco: Never   Tobacco comments:    0.5PPD 08/14/2021  Vaping Use   Vaping Use: Never used  Substance and Sexual Activity   Alcohol use: Yes    Comment: occassionally   Drug use: No   Sexual activity: Not Currently  Other Topics Concern   Not on file  Social History Narrative   Lives alone   Social Determinants of Health   Financial Resource Strain: Low Risk  (03/05/2021)   Overall Financial Resource Strain (CARDIA)    Difficulty of Paying Living Expenses: Not hard at all  Food Insecurity: No Food Insecurity (03/05/2021)   Hunger Vital Sign    Worried About Running Out of Food in the Last Year: Never true    Ran Out of Food in the Last Year: Never true  Transportation Needs: No Transportation Needs (03/05/2021)   PRAPARE - Hydrologist (Medical): No    Lack of Transportation (Non-Medical): No  Physical Activity: Insufficiently Active (03/05/2021)   Exercise Vital Sign    Days of Exercise per Week: 3 days    Minutes of Exercise per Session: 20 min  Stress: No Stress Concern Present (03/05/2021)   [place]    Feeling of Stress : Only a little  Social Connections: Moderately Isolated (03/05/2021)   Social Connection and Isolation Panel [NHANES]    Frequency of Communication with Friends and Family: More than three times a week    Frequency of Social Gatherings with Friends and Family: More than three times a week    Attends Religious Services: Never    Marine scientist or Organizations: Yes    Attends Archivist Meetings: Never    Marital Status: Divorced  Human resources officer  Violence: Not At Risk (03/05/2021)   Humiliation, Afraid, Rape, and Kick questionnaire    Fear of Current or Ex-Partner: No    Emotionally Abused: No    Physically Abused: No    Sexually Abused: No   Social History   Tobacco Use  Smoking Status Every Day   Packs/day: 0.75   Years: 32.00   Total pack years: 24.00   Types: Cigarettes  Smokeless Tobacco Never  Tobacco Comments   0.5PPD 08/14/2021   Social History   Substance and Sexual Activity  Alcohol Use Yes   Comment: occassionally    Family History:  Family History  Problem Relation Age of Onset   Neuropathy Mother    Arthritis Father    Hypertension Father    Leukemia Father    Heart disease Father        CAD s/p CABG   Healthy Brother    Healthy Daughter    Healthy Son    Dementia Maternal Grandmother    Hypertension Maternal Grandmother    Hypercholesterolemia Maternal Grandmother    Heart disease Maternal Grandmother    Healthy Brother    Healthy Brother    Stroke Neg Hx    Cancer Neg Hx     Past medical history, surgical history, medications, allergies, family history and social history reviewed with patient today and changes made to appropriate areas of the chart.   ROS  All other ROS negative except what is listed above and in the HPI.      Objective:    There were no vitals taken for this visit.  Wt Readings from Last 3 Encounters:  06/28/22 185 lb (83.9 kg)  04/23/22 190 lb (86.2 kg)  04/10/22 196 lb 12.8 oz (89.3 kg)    No results found.  Physical Exam     03/05/2021    5:52 PM  6CIT Screen  What Year? 0 points  What month? 0 points  What time? 0 points  Count back from 20 0 points  Months in reverse 0 points  Repeat phrase 6 points  Total Score 6 points    Cognitive Testing - 6-CIT  Correct? Score   What year is it? {YES NO:22349} {Numbers; 0-4:31231} Yes = 0    No = 4  What month is it? {YES NO:22349} {Numbers; 0-4:31231} Yes = 0    No = 3  Remember:     Pia Mau, [place], [place]     What time is it? {YES [id number] {  Numbers; 0-4:31231} Yes = 0    No = 3  Count backwards from 20 to 1 {YES NO:22349} {Numbers; 0-4:31231} Correct = 0    1 error = 2   More than 1 error = 4  Say the months of the year in reverse. {YES NO:22349} {Numbers; 0-4:31231} Correct = 0    1 error = 2   More than 1 error = 4  What address did I ask you to remember? {YES NO:22349} {NUMBERS; 0-10:5044} Correct = 0  1 error = 2    2 error = 4    3 error = 6    4 error = 8    All wrong = 10       TOTAL SCORE  {Numbers; SE:[id number]   Interpretation:  {Desc; normal/abnormal:11317::"Normal"}  Normal (0-7) Abnormal (8-28)   Results for orders placed or performed during the hospital encounter of 06/28/22  Wet prep, genital   Specimen: Vaginal  Result Value Ref Range   Yeast Wet Prep HPF POC NONE SEEN NONE SEEN   Trich, Wet Prep NONE SEEN NONE SEEN   Clue Cells Wet Prep HPF POC NONE SEEN NONE SEEN   WBC, Wet Prep HPF POC <10 <10   Sperm NONE SEEN   Chlamydia/NGC rt PCR ([facility] only)   Specimen: Vaginal  Result Value Ref Range   Specimen source GC/Chlam ENDOCERVICAL    Chlamydia Tr NOT DETECTED NOT DETECTED   N gonorrhoeae NOT DETECTED NOT DETECTED  Lipase, blood  Result Value Ref Range   Lipase 25 11 - 51 U/L  Comprehensive metabolic panel  Result Value Ref Range   Sodium 134 (L) 135 - 145 mmol/L   Potassium 3.3 (L) 3.5 - 5.1 mmol/L   Chloride 100 98 - 111 mmol/L   CO2 25 22 - 32 mmol/L   Glucose, Bld 109 (H) 70 - 99 mg/dL   BUN 7 6 - 20 mg/dL   Creatinine, Ser 0.66 0.44 - 1.00 mg/dL   Calcium 8.9 8.9 - 10.3 mg/dL   Total Protein 7.0 6.5 - 8.1 g/dL   Albumin 4.0 3.5 - 5.0 g/dL   AST 16 15 - 41 U/L   ALT 9 0 - 44 U/L   Alkaline Phosphatase 76 38 - 126 U/L   Total Bilirubin 0.4 0.3 - 1.2 mg/dL   GFR, Estimated >60 >60 mL/min   Anion gap 9 5 - 15  CBC  Result Value Ref Range   WBC 9.0 4.0 - 10.5 K/uL   RBC 3.99 3.87 - 5.11 MIL/uL   Hemoglobin 13.6 12.0 - 15.0 g/dL    HCT 39.9 36.0 - 46.0 %   MCV 100.0 80.0 - 100.0 fL   MCH 34.1 (H) 26.0 - 34.0 pg   MCHC 34.1 30.0 - 36.0 g/dL   RDW 12.4 11.5 - 15.5 %   Platelets 90 (L) 150 - 400 K/uL   nRBC 0.0 0.0 - 0.2 %  Urinalysis, Routine w reflex microscopic Urine, Clean Catch  Result Value Ref Range   Color, Urine AMBER (A) YELLOW   APPearance TURBID (A) CLEAR   Specific Gravity, Urine 1.011 1.005 - 1.030   pH 5.0 5.0 - 8.0   Glucose, UA NEGATIVE NEGATIVE mg/dL   Hgb urine dipstick MODERATE (A) NEGATIVE   Bilirubin Urine NEGATIVE NEGATIVE   Ketones, ur NEGATIVE NEGATIVE mg/dL   Protein, ur >=300 (A) NEGATIVE mg/dL   Nitrite NEGATIVE NEGATIVE   Leukocytes,Ua LARGE (A) NEGATIVE   RBC / HPF >50 (  H) 0 - 5 RBC/hpf   WBC, UA >50 (H) 0 - 5 WBC/hpf   Bacteria, UA MANY (A) NONE SEEN   Squamous Epithelial / HPF 0-5 0 - 5 /HPF   WBC Clumps PRESENT    Mucus PRESENT       Assessment & Plan:   Problem List Items Addressed This Visit       Cardiovascular and Mediastinum   Coronary artery disease   Atherosclerosis of aorta (HCC) - Primary     Respiratory   COPD (chronic obstructive pulmonary disease) with emphysema (HCC)     Endocrine   Acquired hypothyroidism   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   LABORATORY TESTING:  - Pap smear: {Blank [id number] done","not applicable","up to date","done elsewhere"}  IMMUNIZATIONS:   - Tdap: Tetanus vaccination status  reviewed: {tetanus status:315746}. - Influenza: {Blank single:19197::"Up to date","Administered today","Postponed to flu season","Refused","Given elsewhere"} - Pneumovax: {Blank single:19197::"Up to date","Administered today","Not applicable","Refused","Given elsewhere"} - Prevnar: {Blank single:19197::"Up to date","Administered today","Not applicable","Refused","Given elsewhere"} - Zostavax vaccine: {Blank single:19197::"Up to date","Administered today","Not applicable","Refused","Given elsewhere"}  SCREENING: -Mammogram: {Blank single:19197::"Up to date","Ordered today","Not applicable","Refused","Done elsewhere"}  - Colonoscopy: {Blank single:19197::"Up to date","Ordered today","Not applicable","Refused","Done elsewhere"}  - Bone Density: {Blank single:19197::"Up to date","Ordered today","Not applicable","Refused","Done elsewhere"}  -Hearing Test: {Blank single:19197::"Up to date","Ordered today","Not applicable","Refused","Done elsewhere"}  -Spirometry: {Blank single:19197::"Up to date","Ordered today","Not applicable","Refused","Done elsewhere"}   PATIENT COUNSELING:   Advised to take 1 mg of folate supplement per day if capable of pregnancy.   Sexuality: Discussed sexually transmitted diseases, partner selection, use of condoms, avoidance of unintended pregnancy  and contraceptive alternatives.   Advised to avoid cigarette smoking.  I discussed with the patient that most people either abstain from alcohol or drink within safe limits (<=14/week and <=4 drinks/occasion for males, <=7/weeks and <= 3 drinks/occasion for females) and that the risk for alcohol disorders and other health effects rises proportionally with the number of drinks per week and how often a drinker exceeds daily limits.  Discussed cessation/primary prevention of drug use and availability of treatment for abuse.   Diet: Encouraged to adjust caloric intake to maintain  or achieve ideal body weight, to reduce intake of  dietary saturated fat and total fat, to limit sodium intake by avoiding high sodium foods and not adding table salt, and to maintain adequate dietary potassium and calcium preferably from fresh fruits, vegetables, and low-fat dairy products.    stressed the importance of regular exercise  Injury prevention: Discussed safety belts, safety helmets, smoke detector, smoking near bedding or upholstery.   Dental health: Discussed importance of regular tooth brushing, flossing, and dental visits.    NEXT PREVENTATIVE PHYSICAL DUE IN 1 YEAR. No follow-ups on file.

## 2022-06-30 NOTE — Progress Notes (Signed)
Please let patient know that her urine culture shows that the cefidinir and flagyl she was given should take care of the antibiotics.

## 2022-07-04 ENCOUNTER — Telehealth: Payer: Self-pay | Admitting: *Deleted

## 2022-07-04 NOTE — Telephone Encounter (Signed)
        Patient  visited [facility]  on 06/28/2022  for treatment   Telephone encounter attempt :  1st  A HIPAA compliant voice message was left requesting a return call.  Instructed patient to call back at [phone].  [place] [phone] [address] , [place] [address] Email : [email or web address]

## 2022-07-09 ENCOUNTER — Ambulatory Visit
Payer: 59 | Attending: Student in an Organized Health Care Education/Training Program | Admitting: Student in an Organized Health Care Education/Training Program

## 2022-07-09 ENCOUNTER — Ambulatory Visit
Admission: RE | Admit: 2022-07-09 | Discharge: 2022-07-09 | Disposition: A | Discharge status: Home / Self Care | Payer: 59 | Source: Ambulatory Visit | Attending: Student in an Organized Health Care Education/Training Program | Admitting: Student in an Organized Health Care Education/Training Program

## 2022-07-09 ENCOUNTER — Other Ambulatory Visit: Payer: Self-pay | Admitting: Nurse Practitioner

## 2022-07-09 ENCOUNTER — Encounter: Payer: Self-pay | Admitting: Student in an Organized Health Care Education/Training Program

## 2022-07-09 ENCOUNTER — Other Ambulatory Visit: Payer: Self-pay | Admitting: Pulmonary Disease

## 2022-07-09 VITALS — BP 121/92 | HR 68 | Temp 97.4°F | Resp 16 | Ht 61.0 in | Wt 185.0 lb

## 2022-07-09 DIAGNOSIS — M47816 Spondylosis without myelopathy or radiculopathy, lumbar region: Secondary | ICD-10-CM | POA: Diagnosis not present

## 2022-07-09 MED ORDER — LIDOCAINE HCL 2 % IJ SOLN
20.0000 mL | Freq: Once | INTRAMUSCULAR | Status: AC
  Administered 2022-07-09: 400 mg
  Filled 2022-07-09: qty 20

## 2022-07-09 MED ORDER — LACTATED RINGERS IV SOLN: Freq: Once | INTRAVENOUS | Status: AC

## 2022-07-09 MED ORDER — ROPIVACAINE HCL 2 MG/ML IJ SOLN
9.0000 mL | Freq: Once | INTRAMUSCULAR | Status: AC
  Administered 2022-07-09: 9 mL via PERINEURAL
  Filled 2022-07-09: qty 20

## 2022-07-09 MED ORDER — MIDAZOLAM HCL 2 MG/2ML IJ SOLN
0.5000 mg | Freq: Once | INTRAMUSCULAR | Status: AC
  Administered 2022-07-09: 1.5 mg via INTRAVENOUS
  Filled 2022-07-09: qty 2

## 2022-07-09 MED ORDER — DEXAMETHASONE SODIUM PHOSPHATE 10 MG/ML IJ SOLN
10.0000 mg | Freq: Once | INTRAMUSCULAR | Status: AC
  Administered 2022-07-09: 10 mg
  Filled 2022-07-09: qty 1

## 2022-07-09 NOTE — Progress Notes (Signed)
Safety precautions to be maintained throughout the outpatient stay will include: orient to surroundings, keep bed in low position, maintain call bell within reach at all times, provide assistance with transfer out of bed and ambulation.  

## 2022-07-09 NOTE — Progress Notes (Signed)
PROVIDER NOTE: Interpretation of information contained herein should be left to medically-trained personnel. Specific patient instructions are provided elsewhere under "Patient Instructions" section of medical record. This document was created in part using STT-dictation technology, any transcriptional errors that may result from this process are unintentional.  Patient: Janet Murray Type: Established DOB: 25-Dec-1970 MRN: [id number] PCP: Jon Billings, NP  Service: Procedure DOS: 07/09/2022 Setting: Ambulatory Location: Ambulatory outpatient facility Delivery: Face-to-face Provider: Gillis Santa, MD Specialty: Interventional Pain Management Specialty designation: 09 Location: Outpatient facility Ref. Prov.: Jon Billings, NP   Interventional Therapy     Procedure:           Type: Lumbar Facet, Medial Branch Block(s) #1  Laterality: Right  Level: L3, L4, and L5 Medial Branch Level(s). Injecting these levels blocks the L3-4 and L4-5 lumbar facet joints. Imaging: Fluoroscopic guidance         Anesthesia: Local anesthesia (1-2% Lidocaine) Anxiolysis: IV Versed         Sedation: Minimal Sedation                       DOS: 07/09/2022 Performed by: Gillis Santa, MD  Primary Purpose: Diagnostic/Therapeutic Indications: Low back pain severe enough to impact quality of life or function. 1. Lumbar facet arthropathy    NAS-11 Pain score:   Pre-procedure: 7 /10   Post-procedure: 5 /10     Position / Prep / Materials:  Position: Prone  Prep solution: DuraPrep (Iodine Povacrylex [0.7% available iodine] and Isopropyl Alcohol, 74% w/w) Area Prepped: Posterolateral Lumbosacral Spine (Wide prep: From the lower border of the scapula down to the end of the tailbone and from flank to flank.)  Materials:  Tray: Block Needle(s):  Type: Spinal  Gauge (G): 22  Length: 3.5-in Qty: 2      Pre-op H&P Assessment:  Janet Murray is a 52 y.o. (year old), female patient, seen today for  interventional treatment. She  has a past surgical history that includes Bladder surgery; Cesarean section; Appendectomy; Total abdominal hysterectomy w/ bilateral salpingoophorectomy; Esophagogastroduodenoscopy (04/26/2019); Stomach surgery; Colonoscopy with propofol (N/A, 04/11/2020); Thyroidectomy (N/A, 04/12/2021); and Colonoscopy with propofol (N/A, 10/22/2021). Janet Murray has a current medication list which includes the following prescription(s): albuterol, albuterol, AMBULATORY NON FORMULARY MEDICATION, atorvastatin, bevespi aerosphere, diclofenac sodium, esomeprazole, hydroxyzine, levothyroxine, victoza, montelukast, naloxone, ondansetron, oxycodone, pramipexole, tizanidine, and vitamin d (ergocalciferol). Her primarily concern today is the Back Pain (Lower back worse on right)  Initial Vital Signs:  Pulse/HCG Rate: 72ECG Heart Rate: 64 Temp: (!) 97.4 F (36.3 C) Resp: 12 BP: (!) 128/95 (Dr Holley Raring notified) SpO2: 97 %  BMI: Estimated body mass index is 34.96 kg/m as calculated from the following:   Height as of this encounter: '5\' 1"'$  (1.549 m).   Weight as of this encounter: 185 lb (83.9 kg).  Risk Assessment: Allergies: Reviewed. She is allergic to penicillins, acetaminophen, ciprofloxacin, aspirin, hydrocodone, and tape.  Allergy Precautions: None required Coagulopathies: Reviewed. None identified.  Blood-thinner therapy: None at this time Active Infection(s): Reviewed. None identified. Janet Murray is afebrile  Site Confirmation: Janet Murray was asked to confirm the procedure and laterality before marking the site Procedure checklist: Completed Consent: Before the procedure and under the influence of no sedative(s), amnesic(s), or anxiolytics, the patient was informed of the treatment options, risks and possible complications. To fulfill our ethical and legal obligations, as recommended by the American Medical Association's Code of Ethics, I have informed the patient of my clinical  impression; the  nature and purpose of the treatment or procedure; the risks, benefits, and possible complications of the intervention; the alternatives, including doing nothing; the risk(s) and benefit(s) of the alternative treatment(s) or procedure(s); and the risk(s) and benefit(s) of doing nothing. The patient was provided information about the general risks and possible complications associated with the procedure. These may include, but are not limited to: failure to achieve desired goals, infection, bleeding, organ or nerve damage, allergic reactions, paralysis, and death. In addition, the patient was informed of those risks and complications associated to Spine-related procedures, such as failure to decrease pain; infection (i.e.: Meningitis, epidural or intraspinal abscess); bleeding (i.e.: epidural hematoma, subarachnoid hemorrhage, or any other type of intraspinal or peri-dural bleeding); organ or nerve damage (i.e.: Any type of peripheral nerve, nerve root, or spinal cord injury) with subsequent damage to sensory, motor, and/or autonomic systems, resulting in permanent pain, numbness, and/or weakness of one or several areas of the body; allergic reactions; (i.e.: anaphylactic reaction); and/or death. Furthermore, the patient was informed of those risks and complications associated with the medications. These include, but are not limited to: allergic reactions (i.e.: anaphylactic or anaphylactoid reaction(s)); adrenal axis suppression; blood sugar elevation that in diabetics may result in ketoacidosis or comma; water retention that in patients with history of congestive heart failure may result in shortness of breath, pulmonary edema, and decompensation with resultant heart failure; weight gain; swelling or edema; medication-induced neural toxicity; particulate matter embolism and blood vessel occlusion with resultant organ, and/or nervous system infarction; and/or aseptic necrosis of one or more  joints. Finally, the patient was informed that Medicine is not an exact science; therefore, there is also the possibility of unforeseen or unpredictable risks and/or possible complications that may result in a catastrophic outcome. The patient indicated having understood very clearly. We have given the patient no guarantees and we have made no promises. Enough time was given to the patient to ask questions, all of which were answered to the patient's satisfaction. Ms. Carreras has indicated that she wanted to continue with the procedure. Attestation: I, the ordering provider, attest that I have discussed with the patient the benefits, risks, side-effects, alternatives, likelihood of achieving goals, and potential problems during recovery for the procedure that I have provided informed consent. Date  Time: 07/09/2022  9:02 AM   Pre-Procedure Preparation:  Monitoring: As per clinic protocol. Respiration, ETCO2, SpO2, BP, heart rate and rhythm monitor placed and checked for adequate function Safety Precautions: Patient was assessed for positional comfort and pressure points before starting the procedure. Time-out: I initiated and conducted the "Time-out" before starting the procedure, as per protocol. The patient was asked to participate by confirming the accuracy of the "Time Out" information. Verification of the correct person, site, and procedure were performed and confirmed by me, the nursing staff, and the patient. "Time-out" conducted as per Joint Commission's Universal Protocol (UP.01.01.01). Time: 0949  Description of Procedure:          Laterality: (see above) Targeted Levels: (see above)  Safety Precautions: Aspiration looking for blood return was conducted prior to all injections. At no point did we inject any substances, as a needle was being advanced. Before injecting, the patient was told to immediately notify me if she was experiencing any new onset of "ringing in the ears, or metallic taste  in the mouth". No attempts were made at seeking any paresthesias. Safe injection practices and needle disposal techniques used. Medications properly checked for expiration dates. SDV (single dose vial) medications  used. After the completion of the procedure, all disposable equipment used was discarded in the proper designated medical waste containers. Local Anesthesia: Protocol guidelines were followed. The patient was positioned over the fluoroscopy table. The area was prepped in the usual manner. The time-out was completed. The target area was identified using fluoroscopy. A 12-in long, straight, sterile hemostat was used with fluoroscopic guidance to locate the targets for each level blocked. Once located, the skin was marked with an approved surgical skin marker. Once all sites were marked, the skin (epidermis, dermis, and hypodermis), as well as deeper tissues (fat, connective tissue and muscle) were infiltrated with a small amount of a short-acting local anesthetic, loaded on a 10cc syringe with a 25G, 1.5-in  Needle. An appropriate amount of time was allowed for local anesthetics to take effect before proceeding to the next step. Local Anesthetic: Lidocaine 2.0% The unused portion of the local anesthetic was discarded in the proper designated containers. Technical description of process:  L3 Medial Branch Nerve Block (MBB): The target area for the L3 medial branch is at the junction of the postero-lateral aspect of the superior articular process and the superior, posterior, and medial edge of the transverse process of L4. Under fluoroscopic guidance, a Quincke needle was inserted until contact was made with os over the superior postero-lateral aspect of the pedicular shadow (target area). After negative aspiration for blood, 74m of the nerve block solution was injected without difficulty or complication. The needle was removed intact. L4 Medial Branch Nerve Block (MBB): The target area for the L4 medial  branch is at the junction of the postero-lateral aspect of the superior articular process and the superior, posterior, and medial edge of the transverse process of L5. Under fluoroscopic guidance, a Quincke needle was inserted until contact was made with os over the superior postero-lateral aspect of the pedicular shadow (target area). After negative aspiration for blood, 225mof the nerve block solution was injected without difficulty or complication. The needle was removed intact. L5 Medial Branch Nerve Block (MBB): The target area for the L5 medial branch is at the junction of the postero-lateral aspect of the superior articular process and the superior, posterior, and medial edge of the sacral ala. Under fluoroscopic guidance, a Quincke needle was inserted until contact was made with os over the superior postero-lateral aspect of the pedicular shadow (target area). After negative aspiration for blood, [id number] the nerve block solution was injected without difficulty or complication. The needle was removed intact.   Once the entire procedure was completed, the treated area was cleaned, making sure to leave some of the prepping solution back to take advantage of its long term bactericidal properties.         Illustration of the posterior view of the lumbar spine and the posterior neural structures. Laminae of L2 through S1 are labeled. DPRL5, dorsal primary ramus of L5; DPRS1, dorsal primary ramus of S1; DPR3, dorsal primary ramus of L3; FJ, facet (zygapophyseal) joint L3-L4; I, inferior articular process of L4; LB1, lateral branch of dorsal primary ramus of L1; IAB, inferior articular branches from L3 medial branch (supplies L4-L5 facet joint); IBP, intermediate branch plexus; MB3, medial branch of dorsal primary ramus of L3; NR3, third lumbar nerve root; S, superior articular process of L5; SAB, superior articular branches from L4 (supplies L4-5 facet joint also); TP3, transverse process of L3.  Vitals:    07/09/22 0947 07/09/22 0952 07/09/22 0955 07/09/22 1001  BP: 105/71 104/71 116/75 (!) 121/92  Pulse:    68  Resp: '15 13 12 16  '$ Temp:      TempSrc:      SpO2: 94% 98% 97% 99%  Weight:      Height:      PF:         Start Time: 0949 hrs. End Time: 0954 hrs.  Imaging Guidance (Spinal):          Type of Imaging Technique: Fluoroscopy Guidance (Spinal) Indication(s): Assistance in needle guidance and placement for procedures requiring needle placement in or near specific anatomical locations not easily accessible without such assistance. Exposure Time: Please see nurses notes. Contrast: None used. Fluoroscopic Guidance: I was personally present during the use of fluoroscopy. "Tunnel Vision Technique" used to obtain the best possible view of the target area. Parallax error corrected before commencing the procedure. "Direction-depth-direction" technique used to introduce the needle under continuous pulsed fluoroscopy. Once target was reached, antero-posterior, oblique, and lateral fluoroscopic projection used confirm needle placement in all planes. Images permanently stored in EMR. Interpretation: No contrast injected. I personally interpreted the imaging intraoperatively. Adequate needle placement confirmed in multiple planes. Permanent images saved into the patient's record.  Post-operative Assessment:  Post-procedure Vital Signs:  Pulse/HCG Rate: 6864 Temp: (!) 97.4 F (36.3 C) Resp: 16 BP: (!) 121/92 SpO2: 99 %  EBL: None  Complications: No immediate post-treatment complications observed by team, or reported by patient.  Note: The patient tolerated the entire procedure well. A repeat set of vitals were taken after the procedure and the patient was kept under observation following institutional policy, for this type of procedure. Post-procedural neurological assessment was performed, showing return to baseline, prior to discharge. The patient was provided with post-procedure  discharge instructions, including a section on how to identify potential problems. Should any problems arise concerning this procedure, the patient was given instructions to immediately contact us, at any time, without hesitation. In any case, we plan to contact the patient by telephone for a follow-up status report regarding this interventional procedure.  Comments:  No additional relevant information.  Plan of Care  Orders:  Orders Placed This Encounter  Procedures   DG PAIN CLIN[facility] C-ARM 1-60 MIN NO REPORT    Intraoperative interpretation by procedural physician at [place].    Standing Status:   Standing    Number of Occurrences:   1    Order Specific Question:   Reason for exam:    Answer:   Assistance in needle guidance and placement for procedures requiring needle placement in or near specific anatomical locations not easily accessible without such assistance.     Medications ordered for procedure: Meds ordered this encounter  Medications   lidocaine (XYLOCAINE) 2 % (with pres) injection 400 mg   lactated ringers infusion   midazolam (VERSED) injection 0.5-2 mg    Make sure Flumazenil is available in the pyxis when using this medication. If oversedation occurs, administer 0.2 mg IV over 15 sec. If after 45 sec no response, administer 0.2 mg again over 1 min; may repeat at 1 min intervals; not to exceed 4 doses (1 mg)   dexamethasone (DECADRON) injection 10 mg   ropivacaine (PF) 2 mg/mL (0.2%) (NAROPIN) injection 9 mL   Medications administered: We administered lidocaine, lactated ringers, midazolam, dexamethasone, and ropivacaine (PF) 2 mg/mL (0.2%).  See the medical record for exact dosing, route, and time of administration.  Follow-up plan:   Return in about 4 weeks (around 08/06/2022) for Post Procedure Evaluation, in person.  Right L4/5 ESI 04/23/22: 20-25% pain relief for less than a week; right L3, L4, L5 medial branch nerve block 07/09/2022  Right  SI-J/piriformis, right hip bursa injection    Recent Visits Date Type Provider Dept  06/04/22 Office Visit Gillis Santa, MD [facility]  04/23/22 Procedure visit Gillis Santa, MD [facility]  Showing recent visits within past 90 days and meeting all other requirements Today's Visits Date Type Provider Dept  07/09/22 Procedure visit Gillis Santa, MD [facility]  Showing today's visits and meeting all other requirements Future Appointments Date Type Provider Dept  08/06/22 Appointment Gillis Santa, MD [facility]  Showing future appointments within next 90 days and meeting all other requirements  Disposition: Discharge home  Discharge (Date  Time): 07/09/2022; 1003 hrs.   Primary Care Physician: Jon Billings, NP Location: [facility] Note by: Gillis Santa, MD Date: 07/09/2022; Time: 10:42 AM  Disclaimer:  Medicine is not an exact science. The only guarantee in medicine is that nothing is guaranteed. It is important to note that the decision to proceed with this intervention was based on the information collected from the patient. The Data and conclusions were drawn from the patient's questionnaire, the interview, and the physical examination. Because the information was provided in large part by the patient, it cannot be guaranteed that it has not been purposely or unconsciously manipulated. Every effort has been made to obtain as much relevant data as possible for this evaluation. It is important to note that the conclusions that lead to this procedure are derived in large part from the available data. Always take into account that the treatment will also be dependent on availability of resources and existing treatment guidelines, considered by other Pain Management Practitioners as being common knowledge and practice, at the time of the intervention. For Medico-Legal purposes, it is also important to point out that  variation in procedural techniques and pharmacological choices are the acceptable norm. The indications, contraindications, technique, and results of the above procedure should only be interpreted and judged by a Board-Certified Interventional Pain Specialist with extensive familiarity and expertise in the same exact procedure and technique.

## 2022-07-09 NOTE — Patient Instructions (Signed)

## 2022-07-10 ENCOUNTER — Ambulatory Visit (INDEPENDENT_AMBULATORY_CARE_PROVIDER_SITE_OTHER): Payer: 59 | Admitting: Nurse Practitioner

## 2022-07-10 ENCOUNTER — Other Ambulatory Visit: Payer: Self-pay | Admitting: Nurse Practitioner

## 2022-07-10 ENCOUNTER — Encounter: Payer: Self-pay | Admitting: Nurse Practitioner

## 2022-07-10 ENCOUNTER — Telehealth: Payer: Self-pay

## 2022-07-10 VITALS — BP 114/70 | HR 81 | Temp 99.0°F | Ht 61.0 in | Wt 190.2 lb

## 2022-07-10 DIAGNOSIS — E7800 Pure hypercholesterolemia, unspecified: Secondary | ICD-10-CM

## 2022-07-10 DIAGNOSIS — H9193 Unspecified hearing loss, bilateral: Secondary | ICD-10-CM

## 2022-07-10 DIAGNOSIS — Z6835 Body mass index (BMI) 35.0-35.9, adult: Secondary | ICD-10-CM

## 2022-07-10 DIAGNOSIS — J439 Emphysema, unspecified: Secondary | ICD-10-CM

## 2022-07-10 DIAGNOSIS — I25118 Atherosclerotic heart disease of native coronary artery with other forms of angina pectoris: Secondary | ICD-10-CM

## 2022-07-10 DIAGNOSIS — C73 Malignant neoplasm of thyroid gland: Secondary | ICD-10-CM

## 2022-07-10 DIAGNOSIS — Z0000 Encounter for general adult medical examination without abnormal findings: Secondary | ICD-10-CM | POA: Diagnosis not present

## 2022-07-10 DIAGNOSIS — D696 Thrombocytopenia, unspecified: Secondary | ICD-10-CM | POA: Diagnosis not present

## 2022-07-10 DIAGNOSIS — I700 Atherosclerosis of aorta: Secondary | ICD-10-CM

## 2022-07-10 DIAGNOSIS — Z7189 Other specified counseling: Secondary | ICD-10-CM

## 2022-07-10 DIAGNOSIS — E6609 Other obesity due to excess calories: Secondary | ICD-10-CM

## 2022-07-10 DIAGNOSIS — Z1211 Encounter for screening for malignant neoplasm of colon: Secondary | ICD-10-CM

## 2022-07-10 DIAGNOSIS — N63 Unspecified lump in unspecified breast: Secondary | ICD-10-CM

## 2022-07-10 NOTE — Telephone Encounter (Signed)
Requested medication (s) are due for refill today:   Yes  Requested medication (s) are on the active medication list:   Yes  Future visit scheduled:   No   Last ordered: 02/22/2021 #90, 3 refills  Returned because lipid panel is due per protocol   Requested Prescriptions  Pending Prescriptions Disp Refills   atorvastatin (LIPITOR) 80 MG tablet [Pharmacy Med Name: ATORVASTATIN CALCIUM 80 MG TAB] 90 tablet 3    Sig: TAKE 1 TABLET BY MOUTH DAILY     Cardiovascular:  Antilipid - Statins Failed - 07/09/2022  3:23 PM      Failed - Lipid Panel in normal range within the last 12 months    Cholesterol, Total  Date Value Ref Range Status  01/09/2021 200 (H) 100 - 199 mg/dL Final   LDL Chol Calc (NIH)  Date Value Ref Range Status  01/09/2021 131 (H) 0 - 99 mg/dL Final   HDL  Date Value Ref Range Status  01/09/2021 51 >39 mg/dL Final   Triglycerides  Date Value Ref Range Status  01/09/2021 99 0 - 149 mg/dL Final         Passed - Patient is not pregnant      Passed - Valid encounter within last 12 months    Recent Outpatient Visits           2 weeks ago Acute cystitis with hematuria   [place], NP   6 months ago Vitamin D deficiency   [place], Karen, NP   6 months ago Osteoarthritis of spine with radiculopathy, lumbar region   [place], NP   9 months ago Acquired hypothyroidism   [place], Karen, NP   11 months ago Osteoarthritis of spine with radiculopathy, lumbar region   [facility] Jon Billings, NP

## 2022-07-10 NOTE — Telephone Encounter (Signed)
Post procedure follow up.  Patient states she is doing good.  

## 2022-07-10 NOTE — Progress Notes (Signed)
BP 114/70   Pulse 81   Temp 99 F (37.2 C) (Oral)   Ht '5\' 1"'$  (1.549 m)   Wt 190 lb 3.2 oz (86.3 kg)   SpO2 98%   BMI 35.94 kg/m    Subjective:    Patient ID: Janet Murray, female    DOB: Feb 28, 1971, 52 y.o.   MRN: [id number]  HPI: Janet Murray is a 52 y.o. female presenting on 07/10/2022 for comprehensive medical examination. Current medical complaints include:none  She currently lives with: Menopausal Symptoms: no  HYPERLIPIDEMIA Hyperlipidemia status: excellent compliance Satisfied with current treatment?  yes Side effects:  no Medication compliance: excellent compliance Past cholesterol meds: atorvastain (lipitor) Supplements: none Aspirin:  no The 10-year ASCVD risk score (Arnett DK, et al., 2019) is: 6.3%   Values used to calculate the score:     Age: 18 years     Sex: Female     Is Non-Hispanic African American: No     Diabetic: Yes     Tobacco smoker: Yes     Systolic Blood Pressure: 573 mmHg     Is BP treated: No     HDL Cholesterol: 56 mg/dL     Total Cholesterol: 220 mg/dL Chest pain:  no Coronary artery disease:  no Family history CAD:  no Family history early CAD:  no  COPD Followed by Dr. Patsey Berthold, Pulmonology.  Appt in February.  COPD status: controlled Satisfied with current treatment?: yes Oxygen use: no Dyspnea frequency: good.  Cough frequency: good Rescue inhaler frequency:   Limitation of activity: yes Productive cough:  Last Spirometry:  Pneumovax: Up to Date Influenza: Up to Date  HYPOTHYROIDISM Thyroid control status:controlled Satisfied with current treatment? yes Medication side effects: yes Medication compliance: excellent compliance Etiology of hypothyroidism:  Recent dose adjustment:yes Fatigue: no Cold intolerance: no Heat intolerance: no Weight gain: no Weight loss: no Constipation: no Diarrhea/loose stools: no Palpitations: no Lower extremity edema: no Anxiety/depressed mood: no   Functional Status  Survey: Is the patient deaf or have difficulty hearing?: Yes Does the patient have difficulty seeing, even when wearing glasses/contacts?: Yes Does the patient have difficulty concentrating, remembering, or making decisions?: No Does the patient have difficulty walking or climbing stairs?: Yes Does the patient have difficulty dressing or bathing?: No Does the patient have difficulty doing errands alone such as visiting a doctor's office or shopping?: No     07/10/2022    4:05 PM 07/09/2022    9:15 AM 06/24/2022    4:10 PM 04/23/2022    1:34 PM 03/27/2022   12:47 PM  [place] in the past year? 0 0 0 0 0  Number falls in past yr: 0  0    Injury with Fall? 0  0    Risk for fall due to : No Fall Risks  No Fall Risks    Follow up Falls evaluation completed  Falls evaluation completed      Depression Screen    07/10/2022    4:05 PM 07/09/2022    9:15 AM 06/24/2022    4:11 PM 04/23/2022    1:34 PM 12/27/2021    1:33 PM  Depression screen PHQ 2/9  Decreased Interest 1 0 1 0 0  Down, Depressed, Hopeless 0 0 0 0 0  PHQ - 2 Score 1 0 1 0 0  Altered sleeping 1 0 1  1  Tired, decreased energy 1 0 1  1  Change in appetite 1 0 1  0  Feeling bad or failure about yourself  0 0 0  0  Trouble concentrating 0 0 0  0  Moving slowly or fidgety/restless 0 0 0  0  Suicidal thoughts 0 0 0  0  PHQ-9 Score 4 0 4  2  Difficult doing work/chores Not difficult at all  Not difficult at all  Not difficult at all     Advanced Directives Does patient have a HCPOA?    no If yes, name and contact information:  Does patient have a living will or MOST form?  no Paulino Rily  Past Medical History:  Past Medical History:  Diagnosis Date   Abdominal mass, RUQ (right upper quadrant)    Anxiety    Arthritis    Asthma    Bronchitis    Cancer ([place]) 04/12/2021   Carpal tunnel syndrome of left wrist 10/25/2014   Chronic bronchitis, obstructive 07/20/2017   Chronic pain    COPD (chronic obstructive  pulmonary disease) (HCC)    Coronary artery disease    Cyst of right kidney 03/22/2018   DDD (degenerative disc disease), lumbar 10/25/2014   Degenerative joint disease (DJD) of hip    Diarrhea of infectious origin 03/25/2018   Dysphagia    Dyspnea    Emphysema of lung (HCC)    Facet syndrome, lumbar 10/25/2014   Fatty liver 03/25/2018   Foot fracture, left 05/02/2015   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   GERD (gastroesophageal reflux disease)    Hereditary and idiopathic peripheral neuropathy 11/06/2014   Hypothyroidism    IBS (irritable bowel syndrome)    Low back pain    Migraine 02/04/2016   Occipital neuralgia    Osteoarthritis of spine with radiculopathy, lumbar region 10/25/2014   Other intervertebral disc degeneration, lumbar region    Other irritable bowel syndrome 04/20/2017   Other spondylosis with radiculopathy, lumbar region 10/25/2014   RLS (restless legs syndrome)    Sacroiliac joint dysfunction of both sides 10/25/2014   Spondylosis of lumbar spine    Urinary incontinence     Surgical History:  Past Surgical History:  Procedure Laterality Date   APPENDECTOMY     BLADDER SURGERY     sling - Dr. Davis Gourd   CESAREAN SECTION     COLONOSCOPY WITH PROPOFOL N/A 04/11/2020   Procedure: COLONOSCOPY WITH PROPOFOL;  Surgeon: Virgel Manifold, MD;  Location: [facility] ENDOSCOPY;  Service: Endoscopy;  Laterality: N/A;   COLONOSCOPY WITH PROPOFOL N/A 10/22/2021   Procedure: COLONOSCOPY WITH PROPOFOL;  Surgeon: Lin Landsman, MD;  Location: [facility] ENDOSCOPY;  Service: Gastroenterology;  Laterality: N/A;   ESOPHAGOGASTRODUODENOSCOPY  04/26/2019   STOMACH SURGERY     THYROIDECTOMY N/A 04/12/2021   Procedure: THYROIDECTOMY, total;  Surgeon: Jules Husbands, MD;  Location: [facility] ORS;  Service: General;  Laterality: N/A;   TOTAL ABDOMINAL HYSTERECTOMY W/ BILATERAL SALPINGOOPHORECTOMY      Medications:  Current Outpatient Medications on File Prior to Visit  Medication Sig    albuterol (PROVENTIL) (2.5 MG/3ML) 0.083% nebulizer solution Take 3 mLs (2.5 mg total) by nebulization every 6 (six) hours as needed for wheezing or shortness of breath.   albuterol (VENTOLIN HFA) 108 (90 Base) MCG/ACT inhaler INHALE 2 PUFFS INTO THE LUNGS EVERY 6 HOURS AS NEEDED FOR WHEEZING OR SHORTNESS OF BREATH   AMBULATORY NON FORMULARY MEDICATION Medication Name: nebulizer DX: J44.9   atorvastatin (LIPITOR) 80 MG tablet TAKE 1 TABLET BY MOUTH DAILY   BEVESPI AEROSPHERE 9-4.8 MCG/ACT AERO INHALE 2 PUFFS BY MOUTH  INTO THE LUNGS 2TIMES DAILY   diclofenac Sodium (VOLTAREN) 1 % GEL Apply 1 application topically daily as needed (Knee pain).   esomeprazole (NEXIUM) 40 MG capsule TAKE ONE CAPSULE BY MOUTH TWICE A DAY   hydrOXYzine (ATARAX/VISTARIL) 25 MG tablet Take 1 tablet (25 mg total) by mouth 3 (three) times daily as needed. (Patient taking differently: Take 25 mg by mouth 3 (three) times daily as needed for anxiety.)   levothyroxine (SYNTHROID) 175 MCG tablet Take 1 tablet (175 mcg total) by mouth daily.   liraglutide (VICTOZA) 18 MG/3ML SOPN INJECT 1.2 MG INTO THE SKIN DAILY   montelukast (SINGULAIR) 10 MG tablet TAKE 1 TABLET BY MOUTH AT BEDTIME   naloxone (NARCAN) nasal spray 4 mg/0.1 mL SMARTSIG:Both Nares   ondansetron (ZOFRAN-ODT) 4 MG disintegrating tablet TAKE 1 TABLET BY MOUTH EVERY 8 HOURS AS NEEDED   oxyCODONE (OXY IR/ROXICODONE) 5 MG immediate release tablet Take 1 tablet (5 mg total) by mouth every 4 (four) hours as needed for severe pain.   pramipexole (MIRAPEX) 0.5 MG tablet TAKE ONE TABLET BY MOUTH THREE TIMES A DAY   tiZANidine (ZANAFLEX) 2 MG tablet Take 2 mg by mouth 2 (two) times daily.   Vitamin D, Ergocalciferol, (DRISDOL) 1.25 MG (50000 UNIT) CAPS capsule Take 1 capsule (50,000 Units total) by mouth every 7 (seven) days.   No current facility-administered medications on file prior to visit.    Allergies:  Allergies  Allergen Reactions   Penicillins Hives and  Swelling    Has patient had a PCN reaction causing immediate rash, facial/tongue/throat swelling, SOB or lightheadedness with hypotension: Yes Has patient had a PCN reaction causing severe rash involving mucus membranes or skin necrosis: No Has patient had a PCN reaction that required hospitalization No Has patient had a PCN reaction occurring within the last 10 years: No If all of the above answers are "NO", then may proceed with Cephalosporin use.   Acetaminophen     Due to polyps    Ciprofloxacin Itching   Aspirin Palpitations    Heart palpitations   Hydrocodone Hives and Rash   Tape Rash    Per patient "clear tape"    Social History:  Social History   Socioeconomic History   Marital status: Single    Spouse name: Not on file   Number of children: 2   Years of education: Not on file   Highest education level: Not on file  Occupational History   Not on file  Tobacco Use   Smoking status: Every Day    Packs/day: 0.75    Years: 32.00    Total pack years: 24.00    Types: Cigarettes   Smokeless tobacco: Never   Tobacco comments:    0.5PPD 08/14/2021  Vaping Use   Vaping Use: Never used  Substance and Sexual Activity   Alcohol use: Yes    Comment: occassionally   Drug use: No   Sexual activity: Not Currently  Other Topics Concern   Not on file  Social History Narrative   Lives alone   Social Determinants of Health   Financial Resource Strain: Low Risk  (03/05/2021)   Overall Financial Resource Strain (CARDIA)    Difficulty of Paying Living Expenses: Not hard at all  Food Insecurity: No Food Insecurity (03/05/2021)   Hunger Vital Sign    Worried About Running Out of Food in the Last Year: Never true    Ran Out of Food in the Last Year: Never true  Transportation Needs:  No Transportation Needs (03/05/2021)   PRAPARE - Hydrologist (Medical): No    Lack of Transportation (Non-Medical): No  Physical Activity: Insufficiently Active  (03/05/2021)   Exercise Vital Sign    Days of Exercise per Week: 3 days    Minutes of Exercise per Session: 20 min  Stress: No Stress Concern Present (03/05/2021)   [place]    Feeling of Stress : Only a little  Social Connections: Moderately Isolated (03/05/2021)   Social Connection and Isolation Panel [NHANES]    Frequency of Communication with Friends and Family: More than three times a week    Frequency of Social Gatherings with Friends and Family: More than three times a week    Attends Religious Services: Never    Marine scientist or Organizations: Yes    Attends Archivist Meetings: Never    Marital Status: Divorced  Human resources officer Violence: Not At Risk (03/05/2021)   Humiliation, Afraid, Rape, and Kick questionnaire    Fear of Current or Ex-Partner: No    Emotionally Abused: No    Physically Abused: No    Sexually Abused: No   Social History   Tobacco Use  Smoking Status Every Day   Packs/day: 0.75   Years: 32.00   Total pack years: 24.00   Types: Cigarettes  Smokeless Tobacco Never  Tobacco Comments   0.5PPD 08/14/2021   Social History   Substance and Sexual Activity  Alcohol Use Yes   Comment: occassionally    Family History:  Family History  Problem Relation Age of Onset   Neuropathy Mother    Arthritis Father    Hypertension Father    Leukemia Father    Heart disease Father        CAD s/p CABG   Healthy Brother    Healthy Daughter    Healthy Son    Dementia Maternal Grandmother    Hypertension Maternal Grandmother    Hypercholesterolemia Maternal Grandmother    Heart disease Maternal Grandmother    Healthy Brother    Healthy Brother    Stroke Neg Hx    Cancer Neg Hx     Past medical history, surgical history, medications, allergies, family history and social history reviewed with patient today and changes made to appropriate areas of the chart.   Review of  Systems  Eyes:  Negative for blurred vision and double vision.  Respiratory:  Negative for shortness of breath.   Cardiovascular:  Negative for chest pain, palpitations and leg swelling.  Neurological:  Negative for dizziness and headaches.    All other ROS negative except what is listed above and in the HPI.      Objective:    BP 114/70   Pulse 81   Temp 99 F (37.2 C) (Oral)   Ht '5\' 1"'$  (1.549 m)   Wt 190 lb 3.2 oz (86.3 kg)   SpO2 98%   BMI 35.94 kg/m   Wt Readings from Last 3 Encounters:  07/10/22 190 lb 3.2 oz (86.3 kg)  07/09/22 185 lb (83.9 kg)  06/28/22 185 lb (83.9 kg)    No results found.  Physical Exam Vitals and nursing note reviewed.  Constitutional:      General: She is awake. She is not in acute distress.    Appearance: Normal appearance. She is well-developed. She is obese. She is not ill-appearing.  HENT:     Head: Normocephalic and atraumatic.  Right Ear: Hearing, tympanic membrane, ear canal and external ear normal. No drainage.     Left Ear: Hearing, tympanic membrane, ear canal and external ear normal. No drainage.     Nose: Nose normal.     Right Sinus: No maxillary sinus tenderness or frontal sinus tenderness.     Left Sinus: No maxillary sinus tenderness or frontal sinus tenderness.     Mouth/Throat:     Mouth: Mucous membranes are moist.     Pharynx: Oropharynx is clear. Uvula midline. No pharyngeal swelling, oropharyngeal exudate or posterior oropharyngeal erythema.  Eyes:     General: Lids are normal.        Right eye: No discharge.        Left eye: No discharge.     Extraocular Movements: Extraocular movements intact.     Conjunctiva/sclera: Conjunctivae normal.     Pupils: Pupils are equal, round, and reactive to light.     Visual Fields: Right eye visual fields normal and left eye visual fields normal.  Neck:     Thyroid: No thyromegaly.     Vascular: No carotid bruit.     Trachea: Trachea normal.  Cardiovascular:     Rate and  Rhythm: Normal rate and regular rhythm.     Heart sounds: Normal heart sounds. No murmur heard.    No gallop.  Pulmonary:     Effort: Pulmonary effort is normal. No accessory muscle usage or respiratory distress.     Breath sounds: Normal breath sounds.  Chest:  Breasts:    Right: Normal.     Left: Normal.  Abdominal:     General: Bowel sounds are normal.     Palpations: Abdomen is soft. There is no hepatomegaly or splenomegaly.     Tenderness: There is no abdominal tenderness.  Musculoskeletal:        General: Normal range of motion.     Cervical back: Normal range of motion and neck supple.     Right lower leg: No edema.     Left lower leg: No edema.  Lymphadenopathy:     Head:     Right side of head: No submental, submandibular, tonsillar, preauricular or posterior auricular adenopathy.     Left side of head: No submental, submandibular, tonsillar, preauricular or posterior auricular adenopathy.     Cervical: No cervical adenopathy.     Upper Body:     Right upper body: No supraclavicular, axillary or pectoral adenopathy.     Left upper body: No supraclavicular, axillary or pectoral adenopathy.  Skin:    General: Skin is warm and dry.     Capillary Refill: Capillary refill takes less than 2 seconds.     Findings: No rash.  Neurological:     Mental Status: She is alert and oriented to person, place, and time.     Gait: Gait is intact.  Psychiatric:        Attention and Perception: Attention normal.        Mood and Affect: Mood normal.        Speech: Speech normal.        Behavior: Behavior normal. Behavior is cooperative.        Thought Content: Thought content normal.        Judgment: Judgment normal.        03/05/2021    5:52 PM  6CIT Screen  What Year? 0 points  What month? 0 points  What time? 0 points  Count back from 20 0  points  Months in reverse 0 points  Repeat phrase 6 points  Total Score 6 points    Cognitive Testing - 6-CIT  Correct? Score    What year is it? yes 0 Yes = 0    No = 4  What month is it? yes 0 Yes = 0    No = 3  Remember:     Pia [address], [place], [place]     What time is it? yes 0 Yes = 0    No = 3  Count backwards from 20 to 1 yes 0 Correct = 0    1 error = 2   More than 1 error = 4  Say the months of the year in reverse. yes 0 Correct = 0    1 error = 2   More than 1 error = 4  What address did I ask you to remember? yes 0 Correct = 0  1 error = 2    2 error = 4    3 error = 6    4 error = 8    All wrong = 10       TOTAL SCORE  0/28   Interpretation:  Normal  Normal (0-7) Abnormal (8-28)   Results for orders placed or performed in visit on 07/10/22  Microscopic Examination   Urine  Result Value Ref Range   WBC, UA 0-5 0 - 5 /hpf   RBC, Urine 0-2 0 - 2 /hpf   Epithelial Cells (non renal) 0-10 0 - 10 /hpf   Mucus, UA Present (A) Not Estab.   Bacteria, UA None seen None seen/Few  CBC with Differential/Platelet  Result Value Ref Range   WBC WILL FOLLOW    RBC WILL FOLLOW    Hemoglobin WILL FOLLOW    Hematocrit WILL FOLLOW    MCV WILL FOLLOW    MCH WILL FOLLOW    MCHC WILL FOLLOW    RDW WILL FOLLOW    Platelets WILL FOLLOW    Neutrophils WILL FOLLOW    Lymphs WILL FOLLOW    Monocytes WILL FOLLOW    Eos WILL FOLLOW    Basos WILL FOLLOW    Immature Cells WILL FOLLOW    Neutrophils Absolute WILL FOLLOW    Lymphocytes Absolute WILL FOLLOW    Monocytes Absolute WILL FOLLOW    EOS (ABSOLUTE) WILL FOLLOW    Basophils Absolute WILL FOLLOW    Immature Granulocytes WILL FOLLOW    Immature Grans (Abs) WILL FOLLOW    NRBC WILL FOLLOW    Hematology Comments: WILL FOLLOW   Comprehensive metabolic panel  Result Value Ref Range   Glucose 96 70 - 99 mg/dL   BUN 9 6 - 24 mg/dL   Creatinine, Ser 0.73 0.57 - 1.00 mg/dL   eGFR 100 >59 mL/min/1.73   BUN/Creatinine Ratio 12 9 - 23   Sodium 140 134 - 144 mmol/L   Potassium 3.6 3.5 - 5.2 mmol/L   Chloride 101 96 - 106 mmol/L   CO2 22 20 - 29 mmol/L    Calcium 9.4 8.7 - 10.2 mg/dL   Total Protein 6.4 6.0 - 8.5 g/dL   Albumin 4.5 3.8 - 4.9 g/dL   Globulin, Total 1.9 1.5 - 4.5 g/dL   Albumin/Globulin Ratio 2.4 (H) 1.2 - 2.2   Bilirubin Total 0.3 0.0 - 1.2 mg/dL   Alkaline Phosphatase 82 44 - 121 IU/L   AST 10 0 - 40 IU/L   ALT  7 0 - 32 IU/L  Lipid panel  Result Value Ref Range   Cholesterol, Total 220 (H) 100 - 199 mg/dL   Triglycerides 107 0 - 149 mg/dL   HDL 56 >39 mg/dL   VLDL Cholesterol Cal 19 5 - 40 mg/dL   LDL Chol Calc (NIH) 145 (H) 0 - 99 mg/dL   Chol/HDL Ratio 3.9 0.0 - 4.4 ratio  TSH  Result Value Ref Range   TSH 2.230 0.450 - 4.500 uIU/mL  Urinalysis, Routine w reflex microscopic  Result Value Ref Range   Specific Gravity, UA 1.020 1.005 - 1.030   pH, UA 5.0 5.0 - 7.5   Color, UA Yellow Yellow   Appearance Ur Clear Clear   Leukocytes,UA Negative Negative   Protein,UA Negative Negative/Trace   Glucose, UA Negative Negative   Ketones, UA Trace (A) Negative   RBC, UA 2+ (A) Negative   Bilirubin, UA Negative Negative   Urobilinogen, Ur 0.2 0.2 - 1.0 mg/dL   Nitrite, UA Negative Negative   Microscopic Examination See below:   T4, free  Result Value Ref Range   Free T4 1.32 0.82 - 1.77 ng/dL      Assessment & Plan:   Problem List Items Addressed This Visit       Cardiovascular and Mediastinum   Coronary artery disease    Chronic.  Controlled.  Continue with current medication regimen of Atorvastatin '80mg'$  daily.  Labs ordered today.  Return to clinic in 6 months for reevaluation.  Call sooner if concerns arise.        Atherosclerosis of aorta (HCC)    Chronic.  Controlled.  Continue with current medication regimen of Atorvastatin '80mg'$  daily.  Labs ordered today.  Return to clinic in 6 months for reevaluation.  Call sooner if concerns arise.         Respiratory   COPD (chronic obstructive pulmonary disease) with emphysema (HCC)    Chronic.  Controlled.  Continue with current medication regimen.  Followed  by Pulmonology, Dr. Patsey Berthold.  Labs ordered today.  Return to clinic in 6 months for reevaluation.  Call sooner if concerns arise.          Endocrine   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HCC)    Chronic.  Controlled.  Continue with current medication regimen.  New referral placed for patient to see Oncology for further follow up.  Labs ordered today.  Return to clinic in 6 months for reevaluation.  Call sooner if concerns arise.        Relevant Orders   Ambulatory referral to Hematology / Oncology   T4, free (Completed)     Hematopoietic and Hemostatic   Thrombocytopenia ([place])    Labs ordered at visit today.  Will make recommendations based on lab results.          Other   Obesity    Recommended eating smaller high protein, low fat meals more frequently and exercising 30 mins a day 5 times a week with a goal of 10-15lb weight loss in the next 3 months.       High cholesterol    Chronic.  Controlled.  Continue with current medication regimen of Atorvastatin '80mg'$ .  Labs ordered today.  Return to clinic in 6 months for reevaluation.  Call sooner if concerns arise.        Relevant Orders   Lipid panel (Completed)   Advanced care planning/counseling discussion    A voluntary discussion about advance care planning including the explanation and  discussion of advance directives was extensively discussed  with the patient for 10 minutes with patient.  Explanation about the health care proxy and Living will was reviewed and packet with forms with explanation of how to fill them out was given.  During this discussion, the patient was able to identify a health care proxy as her son, Paulino Rily and plans to fill out the paperwork required.  Patient was offered a separate [place] visit for further assistance with forms.         Other Visit Diagnoses     Encounter for annual wellness exam in Medicare patient    -  Primary   Annual physical exam       Health maintenance reviewed  during visit today.  Labs ordered.  Declined Flu. Discussed vaccines today. Colonoscopy, Mammogram, and PAP up to date.   Relevant Orders   CBC with Differential/Platelet (Completed)   Comprehensive metabolic panel (Completed)   Lipid panel (Completed)   TSH (Completed)   Urinalysis, Routine w reflex microscopic (Completed)   Hearing difficulty of both ears       Relevant Orders   Ambulatory referral to ENT   Screening for colon cancer       Relevant Orders   Ambulatory referral to Gastroenterology        Preventative Services:  AAA screening: NA Health Risk Assessment and Personalized Prevention Plan: Up to date Bone Mass Measurements: NA Breast Cancer Screening: Up to date CVD Screening: Up to date Cervical Cancer Screening: Up to date Colon Cancer Screening: Up to date Depression Screening: Up to date Diabetes Screening: Up to date Glaucoma Screening: NA Hepatitis B vaccine: NA Hepatitis C screening: Up to date HIV Screening: UP to date Flu Vaccine: Declined Lung cancer Screening: Up to date Obesity Screening: Up to date Pneumonia Vaccines (2): Up to date STI Screening: NA  Follow up plan: Return in about 6 months (around 01/08/2023) for HTN, HLD, DM2 FU.   LABORATORY TESTING:  - Pap smear: up to date  IMMUNIZATIONS:   - Tdap: Tetanus vaccination status reviewed: last tetanus booster within 10 years. - Influenza: Refused - Pneumovax: Not applicable - Prevnar: Not applicable - Zostavax vaccine:  Discussed at visit today  SCREENING: -Mammogram: Up to date  - Colonoscopy: Up to date  - Bone Density: Not applicable  -Hearing Test: Not applicable  -Spirometry: Not applicable   PATIENT COUNSELING:   Advised to take 1 mg of folate supplement per day if capable of pregnancy.   Sexuality: Discussed sexually transmitted diseases, partner selection, use of condoms, avoidance of unintended pregnancy  and contraceptive alternatives.   Advised to avoid cigarette  smoking.  I discussed with the patient that most people either abstain from alcohol or drink within safe limits (<=14/week and <=4 drinks/occasion for males, <=7/weeks and <= 3 drinks/occasion for females) and that the risk for alcohol disorders and other health effects rises proportionally with the number of drinks per week and how often a drinker exceeds daily limits.  Discussed cessation/primary prevention of drug use and availability of treatment for abuse.   Diet: Encouraged to adjust caloric intake to maintain  or achieve ideal body weight, to reduce intake of dietary saturated fat and total fat, to limit sodium intake by avoiding high sodium foods and not adding table salt, and to maintain adequate dietary potassium and calcium preferably from fresh fruits, vegetables, and low-fat dairy products.    stressed the importance of regular exercise  Injury prevention:  Discussed safety belts, safety helmets, smoke detector, smoking near bedding or upholstery.   Dental health: Discussed importance of regular tooth brushing, flossing, and dental visits.    NEXT PREVENTATIVE PHYSICAL DUE IN 1 YEAR. Return in about 6 months (around 01/08/2023) for HTN, HLD, DM2 FU.

## 2022-07-11 LAB — CBC WITH DIFFERENTIAL/PLATELET
Basophils Absolute: 0 10*3/uL (ref 0.0–0.2)
Basos: 0 %
EOS (ABSOLUTE): 0 10*3/uL (ref 0.0–0.4)
Eos: 0 %
Hematocrit: 38.4 % (ref 34.0–46.6)
Hemoglobin: 13.5 g/dL (ref 11.1–15.9)
Immature Grans (Abs): 0 10*3/uL (ref 0.0–0.1)
Immature Granulocytes: 0 %
Lymphocytes Absolute: 2.7 10*3/uL (ref 0.7–3.1)
Lymphs: 27 %
MCH: 34.1 pg — ABNORMAL HIGH (ref 26.6–33.0)
MCHC: 35.2 g/dL (ref 31.5–35.7)
MCV: 97 fL (ref 79–97)
Monocytes Absolute: 0.6 10*3/uL (ref 0.1–0.9)
Monocytes: 6 %
Neutrophils Absolute: 6.7 10*3/uL (ref 1.4–7.0)
Neutrophils: 67 %
RBC: 3.96 x10E6/uL (ref 3.77–5.28)
RDW: 11.6 % — ABNORMAL LOW (ref 11.7–15.4)
WBC: 10.1 10*3/uL (ref 3.4–10.8)

## 2022-07-11 LAB — URINALYSIS, ROUTINE W REFLEX MICROSCOPIC
Bilirubin, UA: NEGATIVE
Glucose, UA: NEGATIVE
Leukocytes,UA: NEGATIVE
Nitrite, UA: NEGATIVE
Protein,UA: NEGATIVE
Specific Gravity, UA: 1.02 (ref 1.005–1.030)
Urobilinogen, Ur: 0.2 mg/dL (ref 0.2–1.0)
pH, UA: 5 (ref 5.0–7.5)

## 2022-07-11 LAB — COMPREHENSIVE METABOLIC PANEL
ALT: 7 IU/L (ref 0–32)
AST: 10 IU/L (ref 0–40)
Albumin/Globulin Ratio: 2.4 — ABNORMAL HIGH (ref 1.2–2.2)
Albumin: 4.5 g/dL (ref 3.8–4.9)
Alkaline Phosphatase: 82 IU/L (ref 44–121)
BUN/Creatinine Ratio: 12 (ref 9–23)
BUN: 9 mg/dL (ref 6–24)
Bilirubin Total: 0.3 mg/dL (ref 0.0–1.2)
CO2: 22 mmol/L (ref 20–29)
Calcium: 9.4 mg/dL (ref 8.7–10.2)
Chloride: 101 mmol/L (ref 96–106)
Creatinine, Ser: 0.73 mg/dL (ref 0.57–1.00)
Globulin, Total: 1.9 g/dL (ref 1.5–4.5)
Glucose: 96 mg/dL (ref 70–99)
Potassium: 3.6 mmol/L (ref 3.5–5.2)
Sodium: 140 mmol/L (ref 134–144)
Total Protein: 6.4 g/dL (ref 6.0–8.5)
eGFR: 100 mL/min/{1.73_m2} (ref >59)

## 2022-07-11 LAB — MICROSCOPIC EXAMINATION: Bacteria, UA: NONE SEEN

## 2022-07-11 LAB — LIPID PANEL
Chol/HDL Ratio: 3.9 ratio (ref 0.0–4.4)
Cholesterol, Total: 220 mg/dL — ABNORMAL HIGH (ref 100–199)
HDL: 56 mg/dL (ref >39)
LDL Chol Calc (NIH): 145 mg/dL — ABNORMAL HIGH (ref 0–99)
Triglycerides: 107 mg/dL (ref 0–149)
VLDL Cholesterol Cal: 19 mg/dL (ref 5–40)

## 2022-07-11 LAB — TSH: TSH: 2.23 u[IU]/mL (ref 0.450–4.500)

## 2022-07-11 LAB — T4, FREE: Free T4: 1.32 ng/dL (ref 0.82–1.77)

## 2022-07-11 NOTE — Assessment & Plan Note (Signed)
Chronic.  Controlled.  Continue with current medication regimen.  New referral placed for patient to see Oncology for further follow up.  Labs ordered today.  Return to clinic in 6 months for reevaluation.  Call sooner if concerns arise.

## 2022-07-11 NOTE — Assessment & Plan Note (Signed)
A voluntary discussion about advance care planning including the explanation and discussion of advance directives was extensively discussed  with the patient for 10 minutes with patient.  Explanation about the health care proxy and Living will was reviewed and packet with forms with explanation of how to fill them out was given.  During this discussion, the patient was able to identify a health care proxy as her son, Janet Murray and plans to fill out the paperwork required.  Patient was offered a separate [facility] visit for further assistance with forms.

## 2022-07-11 NOTE — Assessment & Plan Note (Signed)
Labs ordered at visit today.  Will make recommendations based on lab results.   

## 2022-07-11 NOTE — Assessment & Plan Note (Signed)
Chronic.  Controlled.  Continue with current medication regimen of Atorvastatin '80mg'$  daily.  Labs ordered today.  Return to clinic in 6 months for reevaluation.  Call sooner if concerns arise.

## 2022-07-11 NOTE — Assessment & Plan Note (Signed)
Chronic.  Controlled.  Continue with current medication regimen.  Followed by Pulmonology, Dr. Patsey Berthold.  Labs ordered today.  Return to clinic in 6 months for reevaluation.  Call sooner if concerns arise.

## 2022-07-11 NOTE — Assessment & Plan Note (Signed)
Recommended eating smaller high protein, low fat meals more frequently and exercising 30 mins a day 5 times a week with a goal of 10-15lb weight loss in the next 3 months.  

## 2022-07-11 NOTE — Assessment & Plan Note (Signed)
Chronic.  Controlled.  Continue with current medication regimen of Atorvastatin '80mg'$ .  Labs ordered today.  Return to clinic in 6 months for reevaluation.  Call sooner if concerns arise.

## 2022-07-14 ENCOUNTER — Other Ambulatory Visit: Payer: Self-pay | Admitting: Nurse Practitioner

## 2022-07-14 DIAGNOSIS — Z23 Encounter for immunization: Secondary | ICD-10-CM

## 2022-07-14 MED ORDER — ZOSTER VAC RECOMB ADJUVANTED 50 MCG/0.5ML IM SUSR: 0.5000 mL | Freq: Once | INTRAMUSCULAR | 0 refills | Status: AC

## 2022-07-14 NOTE — Telephone Encounter (Signed)
Pt is calling to request a script for the shingles vaccine to be sent [employer]. Please advise [phone]- [phone]

## 2022-07-14 NOTE — Progress Notes (Signed)
Hi Caitlyn. It was nice to see you last week.  Your lab work looks good.  No concerns at this time. Continue with your current medication regimen.  Follow up as discussed.  Please let me know if you have any questions.

## 2022-07-14 NOTE — Telephone Encounter (Signed)
Routing to provider. RX t'd up.

## 2022-07-15 ENCOUNTER — Telehealth: Payer: Self-pay

## 2022-07-15 ENCOUNTER — Other Ambulatory Visit: Payer: Self-pay

## 2022-07-15 DIAGNOSIS — Z86010 Personal history of colonic polyps: Secondary | ICD-10-CM

## 2022-07-15 NOTE — Telephone Encounter (Signed)
Gastroenterology Pre-Procedure Review  Request Date: 10/24/22 Requesting Physician: Dr. Marius Ditch  PATIENT REVIEW QUESTIONS: The patient responded to the following health history questions as indicated:    1. Are you having any GI issues? no 2. 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 yes (last colonoscopy performed by Dr. Marius Ditch 10/22/2021) 3. Do you have a family history of Colon Cancer or Polyps? no 4. Diabetes Mellitus? no however patient stated she takes Victoza for weight management daily.  She has been advised to stop Victoza 7 days prior to colonoscopy date. 5. Joint replacements in the past 12 months?no 6. Major health problems in the past 3 months?no 7. Any artificial heart valves, MVP, or defibrillator?no    MEDICATIONS & ALLERGIES:    Patient reports the following regarding taking any anticoagulation/antiplatelet therapy:   Plavix, Coumadin, Eliquis, Xarelto, Lovenox, Pradaxa, Brilinta, or Effient? no Aspirin? no  Patient confirms/reports the following medications:  Current Outpatient Medications  Medication Sig Dispense Refill   albuterol (PROVENTIL) (2.5 MG/3ML) 0.083% nebulizer solution Take 3 mLs (2.5 mg total) by nebulization every 6 (six) hours as needed for wheezing or shortness of breath. 75 mL 12   albuterol (VENTOLIN HFA) 108 (90 Base) MCG/ACT inhaler INHALE 2 PUFFS INTO THE LUNGS EVERY 6 HOURS AS NEEDED FOR WHEEZING OR SHORTNESS OF BREATH 8.5 g 0   AMBULATORY NON FORMULARY MEDICATION Medication Name: nebulizer DX: J44.9 1 each 0   atorvastatin (LIPITOR) 80 MG tablet TAKE 1 TABLET BY MOUTH DAILY 90 tablet 3   BEVESPI AEROSPHERE 9-4.8 MCG/ACT AERO INHALE 2 PUFFS BY MOUTH INTO THE LUNGS 2TIMES DAILY 10.7 g 0   diclofenac Sodium (VOLTAREN) 1 % GEL Apply 1 application topically daily as needed (Knee pain).     esomeprazole (NEXIUM) 40 MG capsule TAKE ONE CAPSULE BY MOUTH TWICE A DAY 180 capsule 2   hydrOXYzine (ATARAX/VISTARIL) 25 MG tablet Take 1 tablet (25 mg total) by  mouth 3 (three) times daily as needed. (Patient taking differently: Take 25 mg by mouth 3 (three) times daily as needed for anxiety.) 30 tablet 5   levothyroxine (SYNTHROID) 175 MCG tablet Take 1 tablet (175 mcg total) by mouth daily. 90 tablet 2   liraglutide (VICTOZA) 18 MG/3ML SOPN INJECT 1.2 MG INTO THE SKIN DAILY 6 mL 1   montelukast (SINGULAIR) 10 MG tablet TAKE 1 TABLET BY MOUTH AT BEDTIME 90 tablet 2   naloxone (NARCAN) nasal spray 4 mg/0.1 mL SMARTSIG:Both Nares     ondansetron (ZOFRAN-ODT) 4 MG disintegrating tablet TAKE 1 TABLET BY MOUTH EVERY 8 HOURS AS NEEDED 30 tablet 0   oxyCODONE (OXY IR/ROXICODONE) 5 MG immediate release tablet Take 1 tablet (5 mg total) by mouth every 4 (four) hours as needed for severe pain. 20 tablet 0   pramipexole (MIRAPEX) 0.5 MG tablet TAKE ONE TABLET BY MOUTH THREE TIMES A DAY 360 tablet 1   tiZANidine (ZANAFLEX) 2 MG tablet Take 2 mg by mouth 2 (two) times daily.     Vitamin D, Ergocalciferol, (DRISDOL) 1.25 MG (50000 UNIT) CAPS capsule Take 1 capsule (50,000 Units total) by mouth every 7 (seven) days. 12 capsule 1   No current facility-administered medications for this visit.    Patient confirms/reports the following allergies:  Allergies  Allergen Reactions   Penicillins Hives and Swelling    Has patient had a PCN reaction causing immediate rash, facial/tongue/throat swelling, SOB or lightheadedness with hypotension: Yes Has patient had a PCN reaction causing severe rash involving mucus membranes or skin  necrosis: No Has patient had a PCN reaction that required hospitalization No Has patient had a PCN reaction occurring within the last 10 years: No If all of the above answers are "NO", then may proceed with Cephalosporin use.   Acetaminophen     Due to polyps    Ciprofloxacin Itching   Aspirin Palpitations    Heart palpitations   Hydrocodone Hives and Rash   Tape Rash    Per patient "clear tape"    No orders of the defined types were  placed in this encounter.   AUTHORIZATION INFORMATION Primary Insurance: 1D#: Group #:  Secondary Insurance: 1D#: Group #:  SCHEDULE INFORMATION: Date: 10/24/22 Time: Location: [facility]

## 2022-07-16 ENCOUNTER — Telehealth: Payer: Self-pay | Admitting: *Deleted

## 2022-07-16 NOTE — Telephone Encounter (Signed)
Nurse placed call to patient to review appointment details for upcoming new oncology consultation visit. No answer. Left vm for patient to return call with any questions or concerns.

## 2022-07-17 ENCOUNTER — Encounter: Payer: Self-pay | Admitting: Oncology

## 2022-07-17 ENCOUNTER — Other Ambulatory Visit: Payer: Self-pay | Admitting: Nurse Practitioner

## 2022-07-17 ENCOUNTER — Inpatient Hospital Stay: Payer: 59 | Attending: Oncology | Admitting: Oncology

## 2022-07-17 ENCOUNTER — Inpatient Hospital Stay: Payer: 59

## 2022-07-17 VITALS — BP 128/78 | HR 77 | Temp 97.0°F | Resp 18 | Wt 191.0 lb

## 2022-07-17 DIAGNOSIS — N63 Unspecified lump in unspecified breast: Secondary | ICD-10-CM

## 2022-07-17 DIAGNOSIS — C73 Malignant neoplasm of thyroid gland: Secondary | ICD-10-CM

## 2022-07-17 NOTE — Progress Notes (Signed)
Patient here for initial oncology appointment, expresses no concerns. Thyroid cancer, thyroidectomy 04/2022. GI cancer 5 years ago reported by patient

## 2022-07-17 NOTE — Progress Notes (Signed)
Janet Murray  [phone] [phone]  ID: Janet Murray OB: 1971-05-01  MR#: [id number]  [id number]  Patient Care Team: Jon Billings, NP as PCP - General Jacolyn Reedy Sherrine Maples, MD as Consulting Physician (Anesthesiology) Jamelle Rushing, MD as Referring Physician (Dermatology) Harl Bowie Lucilla Edin, MD as Referring Physician (Gastroenterology)  CHIEF COMPLAINT: Stage I follicular carcinoma of thyroid.  INTERVAL HISTORY: Patient is a 52 year old female with a past medical history significant of stage I follicular carcinoma status post total thyroidectomy and radioactive iodine ablation.  She was referred to clinic for further evaluation.  She currently feels well and is asymptomatic.  She has no neurologic complaints.  She denies any recent fevers or illnesses.  She has a good appetite and denies weight loss.  She has no chest pain, shortness of breath, cough, or hemoptysis.  She denies any nausea, vomiting, constipation, or diarrhea.  She has no urinary complaints.  Patient feels at her baseline and offers no specific complaints today.  REVIEW OF SYSTEMS:   Review of Systems  Constitutional: Negative.  Negative for fever, malaise/fatigue and weight loss.  Respiratory: Negative.  Negative for cough, hemoptysis and shortness of breath.   Cardiovascular: Negative.  Negative for chest pain and leg swelling.  Gastrointestinal: Negative.  Negative for abdominal pain.  Genitourinary: Negative.  Negative for dysuria.  Musculoskeletal: Negative.  Negative for back pain.  Skin: Negative.  Negative for rash.  Neurological: Negative.  Negative for dizziness, focal weakness, weakness and headaches.  Psychiatric/Behavioral: Negative.  The patient is not nervous/anxious.     As per HPI. Otherwise, a complete review of systems is negative.  PAST MEDICAL HISTORY: Past Medical History:  Diagnosis Date   Abdominal mass, RUQ (right upper quadrant)     Anxiety    Arthritis    Asthma    Bronchitis    Cancer ([place]) 04/12/2021   Carpal tunnel syndrome of left wrist 10/25/2014   Chronic bronchitis, obstructive 07/20/2017   Chronic pain    Colon cancer (HCC)    COPD (chronic obstructive pulmonary disease) ([place])    Coronary artery disease    Cyst of right kidney 03/22/2018   DDD (degenerative disc disease), lumbar 10/25/2014   Degenerative joint disease (DJD) of hip    Diarrhea of infectious origin 03/25/2018   Dysphagia    Dyspnea    Emphysema of lung (HCC)    Facet syndrome, lumbar 10/25/2014   Fatty liver 03/25/2018   Foot fracture, left 05/02/2015   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   GERD (gastroesophageal reflux disease)    Hereditary and idiopathic peripheral neuropathy 11/06/2014   Hypothyroidism    IBS (irritable bowel syndrome)    Low back pain    Migraine 02/04/2016   Occipital neuralgia    Osteoarthritis of spine with radiculopathy, lumbar region 10/25/2014   Other intervertebral disc degeneration, lumbar region    Other irritable bowel syndrome 04/20/2017   Other spondylosis with radiculopathy, lumbar region 10/25/2014   RLS (restless legs syndrome)    Sacroiliac joint dysfunction of both sides 10/25/2014   Spondylosis of lumbar spine    Stomach cancer ([place])    Urinary incontinence     PAST SURGICAL HISTORY: Past Surgical History:  Procedure Laterality Date   ABDOMINAL HYSTERECTOMY     APPENDECTOMY     BLADDER SURGERY     sling - Dr. Davis Gourd   CESAREAN SECTION     COLONOSCOPY WITH PROPOFOL N/A 04/11/2020   Procedure: COLONOSCOPY WITH PROPOFOL;  Surgeon: Bonna Gains,  Lennette Bihari, MD;  Location: [facility] ENDOSCOPY;  Service: Endoscopy;  Laterality: N/A;   COLONOSCOPY WITH PROPOFOL N/A 10/22/2021   Procedure: COLONOSCOPY WITH PROPOFOL;  Surgeon: Lin Landsman, MD;  Location: [facility] ENDOSCOPY;  Service: Gastroenterology;  Laterality: N/A;   ESOPHAGOGASTRODUODENOSCOPY  04/26/2019   STOMACH SURGERY      THYROIDECTOMY N/A 04/12/2021   Procedure: THYROIDECTOMY, total;  Surgeon: Jules Husbands, MD;  Location: [facility] ORS;  Service: General;  Laterality: N/A;   TOTAL ABDOMINAL HYSTERECTOMY W/ BILATERAL SALPINGOOPHORECTOMY      FAMILY HISTORY: Family History  Problem Relation Age of Onset   Neuropathy Mother    Arthritis Father    Hypertension Father    Leukemia Father    Heart disease Father        CAD s/p CABG   Healthy Brother    Healthy Daughter    Healthy Son    Dementia Maternal Grandmother    Hypertension Maternal Grandmother    Hypercholesterolemia Maternal Grandmother    Heart disease Maternal Grandmother    Healthy Brother    Healthy Brother    Stroke Neg Hx    Cancer Neg Hx     ADVANCED DIRECTIVES (Y/N):  N  HEALTH MAINTENANCE: Social History   Tobacco Use   Smoking status: Every Day    Packs/day: 0.25    Years: 25.00    Total pack years: 6.25    Types: Cigarettes   Smokeless tobacco: Never   Tobacco comments:    0.5PPD 08/14/2021  Vaping Use   Vaping Use: Never used  Substance Use Topics   Alcohol use: Not Currently    Comment: occassionally   Drug use: No     Colonoscopy:  PAP:  Bone density:  Lipid panel:  Allergies  Allergen Reactions   Penicillins Hives and Swelling    Has patient had a PCN reaction causing immediate rash, facial/tongue/throat swelling, SOB or lightheadedness with hypotension: Yes Has patient had a PCN reaction causing severe rash involving mucus membranes or skin necrosis: No Has patient had a PCN reaction that required hospitalization No Has patient had a PCN reaction occurring within the last 10 years: No If all of the above answers are "NO", then may proceed with Cephalosporin use.   Acetaminophen     Due to polyps    Ciprofloxacin Itching   Aspirin Palpitations    Heart palpitations   Hydrocodone Hives and Rash   Tape Rash    Per patient "clear tape"    Current Outpatient Medications  Medication Sig Dispense Refill    albuterol (PROVENTIL) (2.5 MG/3ML) 0.083% nebulizer solution Take 3 mLs (2.5 mg total) by nebulization every 6 (six) hours as needed for wheezing or shortness of breath. 75 mL 12   albuterol (VENTOLIN HFA) 108 (90 Base) MCG/ACT inhaler INHALE 2 PUFFS INTO THE LUNGS EVERY 6 HOURS AS NEEDED FOR WHEEZING OR SHORTNESS OF BREATH 8.5 g 0   AMBULATORY NON FORMULARY MEDICATION Medication Name: nebulizer DX: J44.9 1 each 0   atorvastatin (LIPITOR) 80 MG tablet TAKE 1 TABLET BY MOUTH DAILY 90 tablet 3   BEVESPI AEROSPHERE 9-4.8 MCG/ACT AERO INHALE 2 PUFFS BY MOUTH INTO THE LUNGS 2TIMES DAILY 10.7 g 0   diclofenac Sodium (VOLTAREN) 1 % GEL Apply 1 application topically daily as needed (Knee pain).     esomeprazole (NEXIUM) 40 MG capsule TAKE ONE CAPSULE BY MOUTH TWICE A DAY 180 capsule 2   hydrOXYzine (ATARAX/VISTARIL) 25 MG tablet Take 1 tablet (25 mg total)  by mouth 3 (three) times daily as needed. (Patient taking differently: Take 25 mg by mouth 3 (three) times daily as needed for anxiety.) 30 tablet 5   levothyroxine (SYNTHROID) 175 MCG tablet Take 1 tablet (175 mcg total) by mouth daily. 90 tablet 2   liraglutide (VICTOZA) 18 MG/3ML SOPN INJECT 1.2 MG INTO THE SKIN DAILY 6 mL 1   montelukast (SINGULAIR) 10 MG tablet TAKE 1 TABLET BY MOUTH AT BEDTIME 90 tablet 2   naloxone (NARCAN) nasal spray 4 mg/0.1 mL SMARTSIG:Both Nares     ondansetron (ZOFRAN-ODT) 4 MG disintegrating tablet TAKE 1 TABLET BY MOUTH EVERY 8 HOURS AS NEEDED 30 tablet 0   oxyCODONE (OXY IR/ROXICODONE) 5 MG immediate release tablet Take 1 tablet (5 mg total) by mouth every 4 (four) hours as needed for severe pain. 20 tablet 0   pramipexole (MIRAPEX) 0.5 MG tablet TAKE ONE TABLET BY MOUTH THREE TIMES A DAY 360 tablet 1   tiZANidine (ZANAFLEX) 2 MG tablet Take 2 mg by mouth 2 (two) times daily.     Vitamin D, Ergocalciferol, (DRISDOL) 1.25 MG (50000 UNIT) CAPS capsule Take 1 capsule (50,000 Units total) by mouth every 7 (seven) days. 12  capsule 1   No current facility-administered medications for this visit.    OBJECTIVE: Vitals:   07/17/22 1330  BP: 128/78  Pulse: 77  Resp: 18  Temp: (!) 97 F (36.1 C)  SpO2: 100%     Body mass index is 36.09 kg/m.    ECOG FS:0 - Asymptomatic  General: Well-developed, well-nourished, no acute distress. Eyes: Pink conjunctiva, anicteric sclera. HEENT: Normocephalic, moist mucous membranes.  No palpable lymphadenopathy. Lungs: No audible wheezing or coughing. Heart: Regular rate and rhythm. Abdomen: Soft, nontender, no obvious distention. Musculoskeletal: No edema, cyanosis, or clubbing. Neuro: Alert, answering all questions appropriately. Cranial nerves grossly intact. Skin: No rashes or petechiae noted. Psych: Normal affect. Lymphatics: No cervical, calvicular, axillary or inguinal LAD.   LAB RESULTS:  Lab Results  Component Value Date   NA 140 07/10/2022   K 3.6 07/10/2022   CL 101 07/10/2022   CO2 22 07/10/2022   GLUCOSE 96 07/10/2022   BUN 9 07/10/2022   CREATININE 0.73 07/10/2022   CALCIUM 9.4 07/10/2022   PROT 6.4 07/10/2022   ALBUMIN 4.5 07/10/2022   AST 10 07/10/2022   ALT 7 07/10/2022   ALKPHOS 82 07/10/2022   BILITOT 0.3 07/10/2022   GFRNONAA >60 06/28/2022   GFRAA 127 01/19/2020    Lab Results  Component Value Date   WBC 10.1 07/10/2022   NEUTROABS 6.7 07/10/2022   HGB 13.5 07/10/2022   HCT 38.4 07/10/2022   MCV 97 07/10/2022   PLT CANCELED 07/10/2022     STUDIES: DG PAIN CLINIC C-ARM 1-60 MIN NO REPORT  Result Date: 07/09/2022 Fluoro was used, but no Radiologist interpretation will be provided. Please refer to "NOTES" tab for provider progress note.  CT ABDOMEN PELVIS W CONTRAST  Result Date: 06/28/2022 CLINICAL DATA:  Urinary tract infection. Complains flank and right lower quadrant pain. EXAM: CT ABDOMEN AND PELVIS WITH CONTRAST TECHNIQUE: Multidetector CT imaging of the abdomen and pelvis was performed using the standard protocol  following bolus administration of intravenous contrast. RADIATION DOSE REDUCTION: This exam was performed according to the departmental dose-optimization program which includes automated exposure control, adjustment of the mA and/or kV according to patient size and/or use of iterative reconstruction technique. CONTRAST:  111m OMNIPAQUE IOHEXOL 300 MG/ML  SOLN COMPARISON:  CT AP 10/30/2020  FINDINGS: Lower chest: No acute abnormality. Hepatobiliary: No focal liver abnormality is seen. No gallstones, gallbladder wall thickening, or biliary dilatation. Pancreas: Unremarkable. No pancreatic ductal dilatation or surrounding inflammatory changes. Spleen: Normal in size without focal abnormality. Adrenals/Urinary Tract: Normal adrenal glands. Right kidney cyst measures 1.6 cm. No follow-up imaging recommended. No signs of hydronephrosis or nephrolithiasis. There is asymmetric urothelial enhancement involving the right ureter without signs of hydroureter. Mild mucosal enhancement is noted involving the mid and distal left ureter without hydroureter.Mild diffuse circumferential wall thickening and mucosal enhancement is noted involving the bladder. Stomach/Bowel: Stomach appears normal. Appendectomy. Mild stool burden identified within the colon. No bowel wall thickening, inflammation, or distension. Vascular/Lymphatic: Aortic atherosclerosis. No signs abdominopelvic adenopathy. Reproductive: Status post hysterectomy.  No adnexal mass. Other: No free fluid or fluid collections identified. Musculoskeletal: No acute or significant osseous findings. IMPRESSION: 1. Mild diffuse circumferential wall thickening and mucosal enhancement is noted involving the bladder. Urothelial enhancement is noted involving bilateral ureters without hydroureter or ureteral lithiasis. Mild mucosal enhancement is noted involving the mid and distal left ureter without signs of hydroureter. Findings are compatible with cystitis and ureteritis. 2. No  signs of hydronephrosis or nephrolithiasis. Electronically Signed   By: Kerby Moors M.D.   On: 06/28/2022 13:19    ASSESSMENT: Stage I follicular carcinoma of thyroid.  PLAN:    Stage I follicular carcinoma of thyroid: No obvious evidence of recurrent or progressive disease.  Patient underwent complete thyroidectomy on April 12, 2021.  She underwent radioactive iodine ablation on September 04, 2021.  Whole-body iodine scan on September 11, 2021 did not reveal any evidence of disease.  Thyroglobulin antibodies are less than 1.0.  Thyroglobulin levels are pending at time of dictation.  No further intervention is needed at this time.  Return to clinic in 6 months with repeat laboratory work and further evaluation. Thyroidectomy: Patient has requested a referral to endocrinology for further evaluation.  I spent a total of 45 minutes reviewing chart data, face-to-face evaluation with the patient, counseling and coordination of care as detailed above.   Patient expressed understanding and was in agreement with this plan. She also understands that She can call clinic at any time with any questions, concerns, or complaints.    Cancer Staging  Follicular cancer of thyroid [facility]) Staging form: Thyroid - Papillary or Follicular, AJCC 7th Edition - Clinical stage from 07/17/2022: Stage I (T1b, N0, M0) - Signed by Lloyd Huger, MD on 07/17/2022 Stage prefix: Initial diagnosis   Lloyd Huger, MD   07/19/2022 6:53 AM

## 2022-07-18 LAB — THYROGLOBULIN ANTIBODY: Thyroglobulin Antibody: <1 IU/mL (ref 0.0–0.9)

## 2022-07-23 ENCOUNTER — Other Ambulatory Visit: Payer: Self-pay | Admitting: Nurse Practitioner

## 2022-07-23 NOTE — Telephone Encounter (Signed)
Requested Prescriptions  Pending Prescriptions Disp Refills   albuterol (VENTOLIN HFA) 108 (90 Base) MCG/ACT inhaler [Pharmacy Med Name: ALBUTEROL SULFATE HFA 108 (90 BASE)] 8.5 g 0    Sig: INHALE 2 PUFFS INTO THE LUNGS EVERY 6 HOURS AS NEEDED FOR WHEEZING OR SHORTNESS OF BREATH     Pulmonology:  Beta Agonists 2 Passed - 07/23/2022  2:30 PM      Passed - Last BP in normal range    BP Readings from Last 1 Encounters:  07/17/22 128/78         Passed - Last Heart Rate in normal range    Pulse Readings from Last 1 Encounters:  07/17/22 77         Passed - Valid encounter within last 12 months    Recent Outpatient Visits           1 week ago Encounter for annual wellness exam in Medicare patient   [place], NP   4 weeks ago Acute cystitis with hematuria   [place] Jon Billings, NP   6 months ago Vitamin D deficiency   [place], NP   7 months ago Osteoarthritis of spine with radiculopathy, lumbar region   [place], NP   9 months ago Acquired hypothyroidism   [place] Jon Billings, NP       Future Appointments             In 5 months Jon Billings, NP [place], [facility]

## 2022-07-25 ENCOUNTER — Other Ambulatory Visit: Payer: Self-pay | Admitting: Nurse Practitioner

## 2022-07-27 LAB — THYROGLOBULIN LEVEL: Thyroglobulin: <2 ng/mL

## 2022-07-30 ENCOUNTER — Inpatient Hospital Stay (HOSPITAL_BASED_OUTPATIENT_CLINIC_OR_DEPARTMENT_OTHER): Payer: 59 | Admitting: Hospice and Palliative Medicine

## 2022-07-30 DIAGNOSIS — C73 Malignant neoplasm of thyroid gland: Secondary | ICD-10-CM

## 2022-07-30 DIAGNOSIS — M179 Osteoarthritis of knee, unspecified: Secondary | ICD-10-CM | POA: Diagnosis not present

## 2022-07-30 NOTE — Progress Notes (Signed)
Multidisciplinary Oncology Council Documentation  Janet Murray was presented by our [facility] on 07/30/2022, which included representatives from:  Palliative Care Dietitian  Physical/Occupational Therapist Nurse Navigator Genetics Speech Therapist Social work Survivorship RN Financial Navigator Research RN   Janet Murray currently presents with history of thyroid cancer  We reviewed previous medical and familial history, history of present illness, and recent lab results along with all available histopathologic and imaging studies. The [place] considered available treatment options and made the following recommendations/referrals:  None currently  The MOC is a meeting of clinicians from various specialty areas who evaluate and discuss patients for whom a multidisciplinary approach is being considered. Final determinations in the plan of care are those of the provider(s).   Today's extended care, comprehensive team conference, Janet Murray was not present for the discussion and was not examined.

## 2022-08-05 DIAGNOSIS — Z5181 Encounter for therapeutic drug level monitoring: Secondary | ICD-10-CM | POA: Diagnosis not present

## 2022-08-05 DIAGNOSIS — M62830 Muscle spasm of back: Secondary | ICD-10-CM | POA: Diagnosis not present

## 2022-08-05 DIAGNOSIS — G9050 Complex regional pain syndrome I, unspecified: Secondary | ICD-10-CM | POA: Diagnosis not present

## 2022-08-06 ENCOUNTER — Encounter: Payer: Self-pay | Admitting: Student in an Organized Health Care Education/Training Program

## 2022-08-06 ENCOUNTER — Ambulatory Visit
Payer: 59 | Attending: Student in an Organized Health Care Education/Training Program | Admitting: Student in an Organized Health Care Education/Training Program

## 2022-08-06 VITALS — BP 115/87 | HR 82 | Temp 98.0°F | Resp 16 | Ht 61.0 in | Wt 185.0 lb

## 2022-08-06 DIAGNOSIS — M47816 Spondylosis without myelopathy or radiculopathy, lumbar region: Secondary | ICD-10-CM | POA: Insufficient documentation

## 2022-08-06 DIAGNOSIS — G894 Chronic pain syndrome: Secondary | ICD-10-CM | POA: Diagnosis not present

## 2022-08-06 NOTE — Patient Instructions (Signed)

## 2022-08-06 NOTE — Progress Notes (Signed)
PROVIDER NOTE: Information contained herein reflects review and annotations entered in association with encounter. Interpretation of such information and data should be left to medically-trained personnel. Information provided to patient can be located elsewhere in the medical record under "Patient Instructions". Document created using STT-dictation technology, any transcriptional errors that may result from process are unintentional.    Patient: Janet Murray  Service Category: E/M  Provider: Gillis Santa, MD  DOB: 12/19/1970  DOS: 08/06/2022  Referring Provider: Jon Billings, NP  MRN: [id number]  Specialty: Interventional Pain Management  PCP: Jon Billings, NP  Type: Established Patient  Setting: Ambulatory outpatient    Location: Office  Delivery: Face-to-face     HPI  Janet Murray, a 52 y.o. year old female, is here today because of her Lumbar facet arthropathy [M47.816]. Janet Murray primary complain today is Back Pain Last encounter: My last encounter with her was on 07/09/2022. Pertinent problems: Janet Murray has Osteoarthritis of spine with radiculopathy, lumbar region; Lumbar facet arthropathy; Sacroiliac joint dysfunction of both sides; Other intervertebral disc degeneration, lumbar region; and Hereditary and idiopathic neuropathy on their pertinent problem list. Pain Assessment: Severity of Chronic pain is reported as a 8 /10. Location: Back Lower/down side of right foot to the top of foot. Onset: More than a month ago. Quality: Aching, Stabbing, Grimacing, Constant, Sharp, Restless, Tiring, Tingling (Stiff). Timing: Constant. Modifying factor(s): meds, heat. Vitals:  height is '5\' 1"'$  (1.549 m) and weight is 185 lb (83.9 kg). Her temperature is 98 F (36.7 C). Her blood pressure is 115/87 and her pulse is 82. Her respiration is 16 and oxygen saturation is 97%.  BMI: Estimated body mass index is 34.96 kg/m as calculated from the following:   Height as of this encounter: '5\' 1"'$  (1.549  m).   Weight as of this encounter: 185 lb (83.9 kg).  Reason for encounter: post-procedure evaluation and assessment.    Post-procedure evaluation   Type: Lumbar Facet, Medial Branch Block(s) #1  Laterality: Right  Level: L3, L4, and L5 Medial Branch Level(s). Injecting these levels blocks the L3-4 and L4-5 lumbar facet joints. Imaging: Fluoroscopic guidance         Anesthesia: Local anesthesia (1-2% Lidocaine) Anxiolysis: IV Versed         Sedation: Minimal Sedation                       DOS: 07/09/2022 Performed by: Gillis Santa, MD  Primary Purpose: Diagnostic/Therapeutic Indications: Low back pain severe enough to impact quality of life or function. 1. Lumbar facet arthropathy    NAS-11 Pain score:   Pre-procedure: 7 /10   Post-procedure: 5 /10      Effectiveness:  Initial hour after procedure: 100 %  Subsequent 4-6 hours post-procedure: 100 %  Analgesia past initial 6 hours: 70% for 1.5 weeks Ongoing improvement:  Analgesic:  less than 20% Function: Back to baseline ROM: Back to baseline   ROS  Constitutional: Denies any fever or chills Gastrointestinal: No reported hemesis, hematochezia, vomiting, or acute GI distress Musculoskeletal:  Right low back pain Neurological: No reported episodes of acute onset apraxia, aphasia, dysarthria, agnosia, amnesia, paralysis, loss of coordination, or loss of consciousness  Medication Review  AMBULATORY NON FORMULARY MEDICATION, Glycopyrrolate-Formoterol, Vitamin D (Ergocalciferol), albuterol, atorvastatin, diclofenac Sodium, esomeprazole, hydrOXYzine, levothyroxine, liraglutide, montelukast, naloxone, ondansetron, oxyCODONE, pramipexole, and tiZANidine  History Review  Allergy: Janet Murray is allergic to penicillins, acetaminophen, ciprofloxacin, aspirin, hydrocodone, and tape. Drug: Janet Murray  reports  no history of drug use. Alcohol:  reports that she does not currently use alcohol. Tobacco:  reports that she has been smoking  cigarettes. She has a 6.25 pack-year smoking history. She has never used smokeless tobacco. Social: Ms. Kubacki  reports that she has been smoking cigarettes. She has a 6.25 pack-year smoking history. She has never used smokeless tobacco. She reports that she does not currently use alcohol. She reports that she does not use drugs. Medical:  has a past medical history of Abdominal mass, RUQ (right upper quadrant), Anxiety, Arthritis, Asthma, Bronchitis, Cancer ([place]) (04/12/2021), Carpal tunnel syndrome of left wrist (10/25/2014), Chronic bronchitis, obstructive (07/20/2017), Chronic pain, Colon cancer ([place]), COPD (chronic obstructive pulmonary disease) ([place]), Coronary artery disease, Cyst of right kidney (03/22/2018), DDD (degenerative disc disease), lumbar (10/25/2014), Degenerative joint disease (DJD) of hip, Diarrhea of infectious origin (03/25/2018), Dysphagia, Dyspnea, Emphysema of lung ([place]), Facet syndrome, lumbar (10/25/2014), Fatty liver (03/25/2018), Foot fracture, left (05/02/2015), 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), GERD (gastroesophageal reflux disease), Hereditary and idiopathic peripheral neuropathy (11/06/2014), Hypothyroidism, IBS (irritable bowel syndrome), Low back pain, Migraine (02/04/2016), Occipital neuralgia, Osteoarthritis of spine with radiculopathy, lumbar region (10/25/2014), Other intervertebral disc degeneration, lumbar region, Other irritable bowel syndrome (04/20/2017), Other spondylosis with radiculopathy, lumbar region (10/25/2014), RLS (restless legs syndrome), Sacroiliac joint dysfunction of both sides (10/25/2014), Spondylosis of lumbar spine, Stomach cancer ([place]), and Urinary incontinence. Surgical: Janet Murray  has a past surgical history that includes Bladder surgery; Cesarean section; Appendectomy; Total abdominal hysterectomy w/ bilateral salpingoophorectomy; Esophagogastroduodenoscopy (04/26/2019); Stomach surgery; Colonoscopy with propofol (N/A, 04/11/2020); Thyroidectomy (N/A,  04/12/2021); Colonoscopy with propofol (N/A, 10/22/2021); and Abdominal hysterectomy. Family: family history includes Arthritis in her father; Dementia in her maternal grandmother; Healthy in her brother, brother, brother, daughter, and son; Heart disease in her father and maternal grandmother; Hypercholesterolemia in her maternal grandmother; Hypertension in her father and maternal grandmother; Leukemia in her father; Neuropathy in her mother.  Laboratory Chemistry Profile   Renal Lab Results  Component Value Date   BUN 9 07/10/2022   CREATININE 0.73 07/10/2022   BCR 12 07/10/2022   GFRAA 127 01/19/2020   GFRNONAA >60 06/28/2022    Hepatic Lab Results  Component Value Date   AST 10 07/10/2022   ALT 7 07/10/2022   ALBUMIN 4.5 07/10/2022   ALKPHOS 82 07/10/2022   LIPASE 25 06/28/2022    Electrolytes Lab Results  Component Value Date   NA 140 07/10/2022   K 3.6 07/10/2022   CL 101 07/10/2022   CALCIUM 9.4 07/10/2022    Bone Lab Results  Component Value Date   VD25OH 10.1 (L) 12/27/2021    Inflammation (CRP: Acute Phase) (ESR: Chronic Phase) No results found for: "CRP", "ESRSEDRATE", "LATICACIDVEN"       Note: Above Lab results reviewed.  Recent Imaging Review  DG PAIN CLINIC C-ARM 1-60 MIN NO REPORT Fluoro was used, but no Radiologist interpretation will be provided.  Please refer to "NOTES" tab for provider progress note. Note: Reviewed        Physical Exam  General appearance: Well nourished, well developed, and well hydrated. In no apparent acute distress Mental status: Alert, oriented x 3 (person, place, & time)       Respiratory: No evidence of acute respiratory distress Eyes: PERLA Vitals: BP 115/87   Pulse 82   Temp 98 F (36.7 C)   Resp 16   Ht '5\' 1"'$  (1.549 m)   Wt 185 lb (83.9 kg)   SpO2 97%   BMI 34.96  kg/m  BMI: Estimated body mass index is 34.96 kg/m as calculated from the following:   Height as of this encounter: '5\' 1"'$  (1.549 m).   Weight as  of this encounter: 185 lb (83.9 kg). Ideal: Ideal body weight: 47.8 kg (105 lb 6.1 oz) Adjusted ideal body weight: 62.2 kg (137 lb 3.6 oz)  Lumbar Spine Area Exam  Skin & Axial Inspection: No masses, redness, or swelling Alignment: Symmetrical Functional ROM: Pain restricted ROM affecting primarily the right Stability: No instability detected Muscle Tone/Strength: Functionally intact. No obvious neuro-muscular anomalies detected. Sensory (Neurological): Musculoskeletal pain pattern Palpation: No palpable anomalies       Provocative Tests: Hyperextension/rotation test: (+) on the right for facet joint pain. Lumbar quadrant test (Kemp's test): (+) on the right for facet joint pain.  Gait & Posture Assessment  Ambulation: Unassisted Gait: Relatively normal for age and body habitus Posture: WNL  Lower Extremity Exam    Side: Right lower extremity  Side: Left lower extremity  Stability: No instability observed          Stability: No instability observed          Skin & Extremity Inspection: Skin color, temperature, and hair growth are WNL. No peripheral edema or cyanosis. No masses, redness, swelling, asymmetry, or associated skin lesions. No contractures.  Skin & Extremity Inspection: Skin color, temperature, and hair growth are WNL. No peripheral edema or cyanosis. No masses, redness, swelling, asymmetry, or associated skin lesions. No contractures.  Functional ROM: Unrestricted ROM                  Functional ROM: Unrestricted ROM                  Muscle Tone/Strength: Functionally intact. No obvious neuro-muscular anomalies detected.  Muscle Tone/Strength: Functionally intact. No obvious neuro-muscular anomalies detected.  Sensory (Neurological): Unimpaired        Sensory (Neurological): Unimpaired        DTR: Patellar: deferred today Achilles: deferred today Plantar: deferred today  DTR: Patellar: deferred today Achilles: deferred today Plantar: deferred today  Palpation: No  palpable anomalies  Palpation: No palpable anomalies    Assessment   Diagnosis Status  1. Lumbar facet arthropathy   2. Chronic pain syndrome    Responding Responding    Updated Problems: No problems updated.  Plan of Care  Positive response to first set of diagnostic right L3, L4, L5 medial branch nerve block as noted above.  Repeat block #2 and then plan for right lumbar radiofrequency ablation. Orders:  Orders Placed This Encounter  Procedures   LUMBAR FACET(MEDIAL BRANCH NERVE BLOCK) MBNB    Standing Status:   Future    Standing Expiration Date:   11/04/2022    Scheduling Instructions:     Procedure: Lumbar facet block (AKA.: Lumbosacral medial branch nerve block)     Side: RIGHT     Level: L3-4, L4-5,  Facets ( L3, L4, L5, Medial Branch)     Sedation: with IV Versed     Timeframe: ASAA    Order Specific Question:   Where will this procedure be performed?    Answer:   [facility]   Follow-up plan:   Return in about 1 week (around 08/13/2022) for Right L3, 4, 5 MBNB #2, in clinic IV Versed.      Right L4/5 ESI 04/23/22: 20-25% pain relief for less than a week; right L3, L4, L5 medial branch nerve block 07/09/2022  Right SI-J/piriformis,  right hip bursa injection      Recent Visits Date Type Provider Dept  07/09/22 Procedure visit Gillis Santa, MD [facility]  06/04/22 Office Visit Gillis Santa, MD [facility]  Showing recent visits within past 90 days and meeting all other requirements Today's Visits Date Type Provider Dept  08/06/22 Office Visit Gillis Santa, MD [facility]  Showing today's visits and meeting all other requirements Future Appointments No visits were found meeting these conditions. Showing future appointments within next 90 days and meeting all other requirements  I discussed the assessment and treatment plan with the patient. The patient was provided an opportunity to ask questions and all were answered.  The patient agreed with the plan and demonstrated an understanding of the instructions.  Patient advised to call back or seek an in-person evaluation if the symptoms or condition worsens.  Duration of encounter: 81mnutes.  Total time on encounter, as per AMA guidelines included both the face-to-face and non-face-to-face time personally spent by the physician and/or other qualified health care professional(s) on the day of the encounter (includes time in activities that require the physician or other qualified health care professional and does not include time in activities normally performed by clinical staff). Physician's time may include the following activities when performed: Preparing to see the patient (e.g., pre-charting review of records, searching for previously ordered imaging, lab work, and nerve conduction tests) Review of prior analgesic pharmacotherapies. Reviewing PMP Interpreting ordered tests (e.g., lab work, imaging, nerve conduction tests) Performing post-procedure evaluations, including interpretation of diagnostic procedures Obtaining and/or reviewing separately obtained history Performing a medically appropriate examination and/or evaluation Counseling and educating the patient/family/caregiver Ordering medications, tests, or procedures Referring and communicating with other health care professionals (when not separately reported) Documenting clinical information in the electronic or other health record Independently interpreting results (not separately reported) and communicating results to the patient/ family/caregiver Care coordination (not separately reported)  Note by: BGillis Santa MD Date: 08/06/2022; Time: 2:36 PM

## 2022-08-06 NOTE — Progress Notes (Signed)
Safety precautions to be maintained throughout the outpatient stay will include: orient to surroundings, keep bed in low position, maintain call bell within reach at all times, provide assistance with transfer out of bed and ambulation.  

## 2022-08-07 ENCOUNTER — Encounter: Payer: Self-pay | Admitting: Pulmonary Disease

## 2022-08-07 ENCOUNTER — Ambulatory Visit (INDEPENDENT_AMBULATORY_CARE_PROVIDER_SITE_OTHER): Payer: 59 | Admitting: Pulmonary Disease

## 2022-08-07 VITALS — BP 110/68 | HR 75 | Temp 97.3°F | Ht 61.0 in | Wt 188.8 lb

## 2022-08-07 DIAGNOSIS — F17210 Nicotine dependence, cigarettes, uncomplicated: Secondary | ICD-10-CM | POA: Diagnosis not present

## 2022-08-07 DIAGNOSIS — J449 Chronic obstructive pulmonary disease, unspecified: Secondary | ICD-10-CM

## 2022-08-07 DIAGNOSIS — R062 Wheezing: Secondary | ICD-10-CM | POA: Diagnosis not present

## 2022-08-07 LAB — NITRIC OXIDE: Nitric Oxide: 5

## 2022-08-07 MED ORDER — ALBUTEROL SULFATE HFA 108 (90 BASE) MCG/ACT IN AERS: 2.0000 | INHALATION_SPRAY | Freq: Four times a day (QID) | RESPIRATORY_TRACT | 4 refills | Status: DC | PRN

## 2022-08-07 MED ORDER — BEVESPI AEROSPHERE 9-4.8 MCG/ACT IN AERO: INHALATION_SPRAY | RESPIRATORY_TRACT | 11 refills | Status: DC

## 2022-08-07 NOTE — Patient Instructions (Addendum)
You can receive free nicotine replacement therapy (patches, gum, or mints) by calling 1-800-QUIT NOW. Please call so we can get you on the path to becoming a non-smoker. I know it is hard, but you can do this!  We are scheduling breathing tests.  We have sent prescriptions to your pharmacy for the inhalers.  Will see you in follow-up in 3 to 4 months time call sooner should any new problems arise.

## 2022-08-07 NOTE — Progress Notes (Signed)
Subjective:    Patient ID: Janet Murray, female    DOB: February 03, 1971, 52 y.o.   MRN: [id number] Patient Care Team: Jon Billings, NP as PCP - General Jacolyn Reedy Sherrine Maples, MD as Consulting Physician (Anesthesiology) Jamelle Rushing, MD as Referring Physician (Dermatology) Sherri Rad, MD as Referring Physician (Gastroenterology)  Chief Complaint  Patient presents with   Follow-up    SOB with exertion. Wheezing. Cough with white sputum, every morning.     HPI Patient is a 52 year old current smoker (half PPD) who presents for follow-up on the issue of COPD and shortness of breath on exertion.  Patient was last seen here on 14 August 2021.  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  She also was instructed to continue using Bevespi and Singulair.  In the past her PFTs had shown significant reversibility however, she no longer exhibits this on subsequent PFTs.  She is intolerant of ICS due to difficulties with thrush.  Today with no change in the character of her cough or dyspnea.  Notes occasional wheezing.  This response to albuterol inhaler.  She is free contemplative with regards to discontinuation of smoking.  She has not had any chest pain, no orthopnea or paroxysmal nocturnal dyspnea.  No lower extremity edema nor calf tenderness.  She does not endorse any other symptomatology today.  DATA: 10/29/2017 PFT: FEV1 0.66 L or 27% predicted, FVC 1.18 L or 41% predicted, FEV1/FVC 56%, there is significant bronchodilator response with a net change of 58% in FEV1.  Post FEV1 1.04 L or 43% predicted.  Diffusion capacity moderately reduced. 03/14/2021 PFTs: FEV1 1.33 L or 50% predicted, FVC 1.94 L or 58% predicted, FEV1/FVC 68%, no bronchodilator response, lung volumes show hyperinflation and air trapping ERV 25%, consistent with obesity.  Diffusion capacity  normal  Review of Systems A 10 point review of systems was performed and it is as noted above otherwise negative.  Past Medical History:  Diagnosis Date   Abdominal mass, RUQ (right upper quadrant)    Anxiety    Arthritis    Asthma    Bronchitis    Cancer ([place]) 04/12/2021   Carpal tunnel syndrome of left wrist 10/25/2014   Chronic bronchitis, obstructive 07/20/2017   Chronic pain    Colon cancer (HCC)    COPD (chronic obstructive pulmonary disease) ([place])    Coronary artery disease    Cyst of right kidney 03/22/2018   DDD (degenerative disc disease), lumbar 10/25/2014   Degenerative joint disease (DJD) of hip    Diarrhea of infectious origin 03/25/2018   Dysphagia    Dyspnea    Emphysema of lung (HCC)    Facet syndrome, lumbar 10/25/2014   Fatty liver 03/25/2018   Foot fracture, left 05/02/2015   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   GERD (gastroesophageal reflux disease)    Hereditary and idiopathic peripheral neuropathy 11/06/2014   Hypothyroidism    IBS (irritable bowel syndrome)    Low back pain    Migraine 02/04/2016   Occipital neuralgia    Osteoarthritis of spine with radiculopathy, lumbar region 10/25/2014   Other intervertebral disc degeneration, lumbar region    Other irritable bowel syndrome 04/20/2017   Other spondylosis with radiculopathy, lumbar region 10/25/2014   RLS (restless legs syndrome)    Sacroiliac joint dysfunction of both sides 10/25/2014   Spondylosis of lumbar spine    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 Davis Gourd   CESAREAN SECTION     COLONOSCOPY WITH PROPOFOL N/A 04/11/2020   Procedure: COLONOSCOPY WITH PROPOFOL;  Surgeon: Virgel Manifold, MD;  Location: [facility] ENDOSCOPY;  Service: Endoscopy;  Laterality: N/A;   COLONOSCOPY WITH PROPOFOL N/A 10/22/2021   Procedure: COLONOSCOPY WITH PROPOFOL;   Surgeon: Lin Landsman, MD;  Location: [facility] ENDOSCOPY;  Service: Gastroenterology;  Laterality: N/A;   ESOPHAGOGASTRODUODENOSCOPY  04/26/2019   STOMACH SURGERY     THYROIDECTOMY N/A 04/12/2021   Procedure: THYROIDECTOMY, total;  Surgeon: Jules Husbands, MD;  Location: [facility] ORS;  Service: General;  Laterality: N/A;   TOTAL ABDOMINAL HYSTERECTOMY W/ BILATERAL SALPINGOOPHORECTOMY     Patient Active Problem List   Diagnosis Date Noted   Advanced care planning/counseling discussion 07/10/2022   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) 03/27/2022   Neuroforaminal stenosis of lumbar spine (L4/5) 03/27/2022   Chronic pain syndrome 03/27/2022   History of colonic polyps    Atherosclerosis of aorta ([place]) 09/27/2021   Hypocalcemia [id number]   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[place]) 07/31/2021   High cholesterol 01/09/2021   Coronary artery disease 11/09/2020   Special screening for malignant neoplasms, colon    Polyp of colon    Chronic right SI joint pain 04/02/2020   Trochanteric bursitis of right hip 04/02/2020   COPD (chronic obstructive pulmonary disease) with emphysema ([place]) 12/13/2019   Tobacco abuse 12/13/2019   Acquired hypothyroidism 11/01/2019   Panic attack 11/01/2019   Urinary incontinence    Obesity 10/26/2019   Diarrhea of infectious origin 03/25/2018   Fatty liver 03/25/2018   History of alcohol use 03/25/2018   Cyst of right kidney 03/22/2018   Chronic nausea 02/19/2018   Thrombocytopenia ([place]) 02/19/2018   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   Abdominal pain, RUQ (right upper quadrant) 10/10/2017   Chronic bronchitis, obstructive 07/20/2017   Other dysphagia 04/20/2017   Other irritable bowel syndrome 04/20/2017   Migraine 02/04/2016   Lumbar radiculopathy 11/08/2014   Radiculopathy of lumbar region 11/08/2014   Hereditary and idiopathic neuropathy 11/06/2014   Osteoarthritis of spine with radiculopathy, lumbar region 10/25/2014   Lumbar  facet arthropathy 10/25/2014   Sacroiliac joint dysfunction of both sides 10/25/2014   Carpal tunnel syndrome of left wrist 10/25/2014   Occipital neuralgia 10/25/2014   Restless leg syndrome 10/25/2014   Other intervertebral disc degeneration, lumbar region 10/25/2014   Other spondylosis with radiculopathy, lumbar region 10/25/2014   Low back pain 10/25/2014   Sacrococcygeal disorders, not elsewhere classified 10/25/2014   Family History  Problem Relation Age of Onset   Neuropathy Mother    Arthritis Father    Hypertension Father    Leukemia Father    Heart disease Father        CAD s/p CABG   Healthy Brother    Healthy Daughter    Healthy Son    Dementia Maternal Grandmother    Hypertension Maternal Grandmother    Hypercholesterolemia Maternal Grandmother    Heart disease Maternal Grandmother    Healthy Brother    Healthy Brother    Stroke Neg Hx    Cancer Neg Hx    Social History   Tobacco Use   Smoking status: Every Day    Packs/day: 0.50    Years: 25.00  Total pack years: 12.50    Types: Cigarettes   Smokeless tobacco: Never   Tobacco comments:    0.5PPD 08/07/2022  Substance Use Topics   Alcohol use: Not Currently    Comment: occassionally   Allergies  Allergen Reactions   Penicillins Hives and Swelling    Has patient had a PCN reaction causing immediate rash, facial/tongue/throat swelling, SOB or lightheadedness with hypotension: Yes Has patient had a PCN reaction causing severe rash involving mucus membranes or skin necrosis: No Has patient had a PCN reaction that required hospitalization No Has patient had a PCN reaction occurring within the last 10 years: No If all of the above answers are "NO", then may proceed with Cephalosporin use.   Acetaminophen     Due to polyps    Ciprofloxacin Itching   Aspirin Palpitations    Heart palpitations   Hydrocodone Hives and Rash   Tape Rash    Per patient "clear tape"   Current Meds  Medication Sig    albuterol (PROVENTIL) (2.5 MG/3ML) 0.083% nebulizer solution Take 3 mLs (2.5 mg total) by nebulization every 6 (six) hours as needed for wheezing or shortness of breath.   albuterol (VENTOLIN HFA) 108 (90 Base) MCG/ACT inhaler INHALE 2 PUFFS INTO THE LUNGS EVERY 6 HOURS AS NEEDED FOR WHEEZING OR SHORTNESS OF BREATH   AMBULATORY NON FORMULARY MEDICATION Medication Name: nebulizer DX: J44.9   atorvastatin (LIPITOR) 80 MG tablet TAKE 1 TABLET BY MOUTH DAILY   BEVESPI AEROSPHERE 9-4.8 MCG/ACT AERO INHALE 2 PUFFS BY MOUTH INTO THE LUNGS 2TIMES DAILY   diclofenac Sodium (VOLTAREN) 1 % GEL Apply 1 application topically daily as needed (Knee pain).   esomeprazole (NEXIUM) 40 MG capsule TAKE ONE CAPSULE BY MOUTH TWICE A DAY   hydrOXYzine (ATARAX/VISTARIL) 25 MG tablet Take 1 tablet (25 mg total) by mouth 3 (three) times daily as needed. (Patient taking differently: Take 25 mg by mouth 3 (three) times daily as needed for anxiety.)   levothyroxine (SYNTHROID) 175 MCG tablet Take 1 tablet (175 mcg total) by mouth daily.   liraglutide (VICTOZA) 18 MG/3ML SOPN INJECT 1.2 MG INTO THE SKIN DAILY   montelukast (SINGULAIR) 10 MG tablet TAKE 1 TABLET BY MOUTH AT BEDTIME   naloxone (NARCAN) nasal spray 4 mg/0.1 mL SMARTSIG:Both Nares   ondansetron (ZOFRAN-ODT) 4 MG disintegrating tablet TAKE 1 TABLET BY MOUTH EVERY 8 HOURS AS NEEDED   oxyCODONE (OXY IR/ROXICODONE) 5 MG immediate release tablet Take 1 tablet (5 mg total) by mouth every 4 (four) hours as needed for severe pain.   pramipexole (MIRAPEX) 0.5 MG tablet TAKE ONE TABLET BY MOUTH THREE TIMES A DAY   tiZANidine (ZANAFLEX) 2 MG tablet Take 2 mg by mouth 2 (two) times daily.   Vitamin D, Ergocalciferol, (DRISDOL) 1.25 MG (50000 UNIT) CAPS capsule Take 1 capsule (50,000 Units total) by mouth every 7 (seven) days.   Immunization History  Administered Date(s) Administered   Td 07/10/2013       Objective:   Physical Exam BP 110/68 (BP Location: Left Arm,  Cuff Size: Normal)   Pulse 75   Temp (!) 97.3 F (36.3 C)   Ht '5\' 1"'$  (1.549 m)   Wt 188 lb 12.8 oz (85.6 kg)   SpO2 97%   BMI 35.67 kg/m   SpO2: 97 % O2 Device: None (Room air)  GENERAL: Somewhat disheveled appearing woman, no acute distress.  Presents in transport chair. HEAD: Normocephalic, atraumatic.  EYES: Pupils equal, round, reactive to light.  No scleral icterus.  MOUTH: Edentulous, oral mucosa moist.  No thrush. NECK: Supple.  Absent thyroid. Trachea midline. No JVD.  No adenopathy. PULMONARY: Good air entry bilaterally.  No adventitious sounds. CARDIOVASCULAR: S1 and S2. Regular rate and rhythm.  ABDOMEN: Benign. MUSCULOSKELETAL: No joint deformity, no clubbing, no edema.  NEUROLOGIC: No overt focal deficit SKIN: Intact,warm,dry. PSYCH: Somewhat anxious mood, normal behavior   Lab Results  Component Value Date   NITRICOXIDE 5 08/07/2022  *No evidence of type II inflammation.      Assessment & Plan:     ICD-10-CM   1. Stage 3 severe COPD by GOLD classification ([place])  J44.9 Pulmonary Function Test [facility] Only   Continue Bevespi and as needed albuterol Reassess with PFTs STOP SMOKING!    2. Wheezing  R06.2 Nitric oxide   Not evident on exam today Continue Bevespi and as needed albuterol Continue Singulair, intolerant of ICS STOP SMOKING!    3. Tobacco dependence due to cigarettes  F17.210    Patient counseled with regards to discontinuation of smoking Total counseling time 3 to 5 minutes Resources for smoking is a patient provided for the patient     Orders Placed This Encounter  Procedures   Nitric oxide   Pulmonary Function Test [facility] Only    Standing Status:   Future    Standing Expiration Date:   08/07/2023    Order Specific Question:   Full PFT: includes the following: basic spirometry, spirometry pre & post bronchodilator, diffusion capacity (DLCO), lung volumes    Answer:   Full PFT    Order Specific Question:   This test can only be performed at     Answer:   [place] ordered this encounter  Medications   Glycopyrrolate-Formoterol (BEVESPI AEROSPHERE) 9-4.8 MCG/ACT AERO    Sig: 2 puffs twice a day    Dispense:  10.7 g    Refill:  11   albuterol (VENTOLIN HFA) 108 (90 Base) MCG/ACT inhaler    Sig: Inhale 2 puffs into the lungs every 6 (six) hours as needed for wheezing or shortness of breath.    Dispense:  8.5 g    Refill:  4   Will see the patient in follow-up in 3 to 4 months time she is to call sooner should any new problems arise.  Janet Don, MD Advanced Bronchoscopy PCCM [facility]    *This note was dictated using voice recognition software/Dragon.  Despite best efforts to proofread, errors can occur which can change the meaning. Any transcriptional errors that result from this process are unintentional and may not be fully corrected at the time of dictation.

## 2022-08-13 ENCOUNTER — Other Ambulatory Visit: Payer: 59

## 2022-08-13 DIAGNOSIS — H903 Sensorineural hearing loss, bilateral: Secondary | ICD-10-CM | POA: Diagnosis not present

## 2022-08-13 DIAGNOSIS — H6063 Unspecified chronic otitis externa, bilateral: Secondary | ICD-10-CM | POA: Diagnosis not present

## 2022-08-22 ENCOUNTER — Emergency Department: Payer: 59

## 2022-08-22 ENCOUNTER — Encounter: Payer: Self-pay | Admitting: Family Medicine

## 2022-08-22 ENCOUNTER — Emergency Department
Admission: EM | Admit: 2022-08-22 | Discharge: 2022-08-22 | Disposition: A | Discharge status: Home / Self Care | Payer: 59 | Attending: Emergency Medicine | Admitting: Emergency Medicine

## 2022-08-22 ENCOUNTER — Other Ambulatory Visit: Payer: Self-pay

## 2022-08-22 ENCOUNTER — Ambulatory Visit (INDEPENDENT_AMBULATORY_CARE_PROVIDER_SITE_OTHER): Payer: 59 | Admitting: Family Medicine

## 2022-08-22 VITALS — BP 128/84 | HR 80 | Temp 99.0°F | Ht 61.0 in | Wt 188.7 lb

## 2022-08-22 DIAGNOSIS — R0789 Other chest pain: Secondary | ICD-10-CM | POA: Insufficient documentation

## 2022-08-22 DIAGNOSIS — M898X1 Other specified disorders of bone, shoulder: Secondary | ICD-10-CM | POA: Diagnosis not present

## 2022-08-22 DIAGNOSIS — J9811 Atelectasis: Secondary | ICD-10-CM | POA: Diagnosis not present

## 2022-08-22 DIAGNOSIS — R079 Chest pain, unspecified: Secondary | ICD-10-CM

## 2022-08-22 LAB — CBC
HCT: 40.9 % (ref 36.0–46.0)
Hemoglobin: 13.8 g/dL (ref 12.0–15.0)
MCH: 33.3 pg (ref 26.0–34.0)
MCHC: 33.7 g/dL (ref 30.0–36.0)
MCV: 98.8 fL (ref 80.0–100.0)
Platelets: 190 10*3/uL (ref 150–400)
RBC: 4.14 MIL/uL (ref 3.87–5.11)
RDW: 12.8 % (ref 11.5–15.5)
WBC: 6.2 10*3/uL (ref 4.0–10.5)
nRBC: 0 % (ref 0.0–0.2)

## 2022-08-22 LAB — BASIC METABOLIC PANEL
Anion gap: 11 (ref 5–15)
BUN: 7 mg/dL (ref 6–20)
CO2: 21 mmol/L — ABNORMAL LOW (ref 22–32)
Calcium: 9 mg/dL (ref 8.9–10.3)
Chloride: 102 mmol/L (ref 98–111)
Creatinine, Ser: 0.69 mg/dL (ref 0.44–1.00)
GFR, Estimated: >60 mL/min (ref >60)
Glucose, Bld: 130 mg/dL — ABNORMAL HIGH (ref 70–99)
Potassium: 3.7 mmol/L (ref 3.5–5.1)
Sodium: 134 mmol/L — ABNORMAL LOW (ref 135–145)

## 2022-08-22 LAB — TROPONIN I (HIGH SENSITIVITY): Troponin I (High Sensitivity): <2 ng/L (ref <18)

## 2022-08-22 LAB — POC URINE PREG, ED: Preg Test, Ur: NEGATIVE

## 2022-08-22 MED ORDER — IOHEXOL 350 MG/ML SOLN
75.0000 mL | Freq: Once | INTRAVENOUS | Status: AC | PRN
  Administered 2022-08-22: 75 mL via INTRAVENOUS

## 2022-08-22 NOTE — ED Provider Notes (Signed)
[facility] Provider Note    Event Date/Time   First MD Initiated Contact with Patient 08/22/22 1756     (approximate)   History   Chest Pain   HPI  Janet Murray is a 52 y.o. female with extensive family history of CAD who presents with complaints of chest discomfort.  Patient describes 2 days of chest discomfort now with pain between her shoulder blades.  She reports this all started as a sinus infection but has significantly worsened, sent here by her PCP for evaluation.  Denies a history of MI.  Denies pleurisy or shortness of breath     Physical Exam   Triage Vital Signs: ED Triage Vitals  Enc Vitals Group     BP 08/22/22 1749 133/83     Pulse Rate 08/22/22 1749 73     Resp 08/22/22 1749 18     Temp 08/22/22 1749 99 F (37.2 C)     Temp Source 08/22/22 1749 Oral     SpO2 08/22/22 1749 97 %     Weight 08/22/22 1746 85.3 kg (188 lb)     Height 08/22/22 1746 1.549 m (5\' 1" )     Head Circumference --      Peak Flow --      Pain Score 08/22/22 1745 6     Pain Loc --      Pain Edu? --      Excl. in [place]? --     Most recent vital signs: Vitals:   08/22/22 1749 08/22/22 1900  BP: 133/83 116/82  Pulse: 73 63  Resp: 18 20  Temp: 99 F (37.2 C)   SpO2: 97% 98%     General: Awake, no distress.  CV:  Good peripheral perfusion.  Regular rate and rhythm, no murmur Resp:  Normal effort.  Clear to auscultation bilaterally Abd:  No distention.  Other:     ED Results / Procedures / Treatments   Labs (all labs ordered are listed, but only abnormal results are displayed) Labs Reviewed  BASIC METABOLIC PANEL - Abnormal; Notable for the following components:      Result Value   Sodium 134 (*)    CO2 21 (*)    Glucose, Bld 130 (*)    All other components within normal limits  CBC  POC URINE PREG, ED  TROPONIN I (HIGH SENSITIVITY)     EKG  ED ECG REPORT I, Lavonia Drafts, the attending physician, personally viewed and interpreted this  ECG.  Date: 08/22/2022  Rhythm: normal sinus rhythm QRS Axis: normal Intervals: normal ST/T Wave abnormalities: normal Narrative Interpretation: no evidence of acute ischemia    RADIOLOGY Chest x-ray viewed interpreted by me, no acute abnormality    PROCEDURES:  Critical Care performed:   Procedures   MEDICATIONS ORDERED IN ED: Medications  iohexol (OMNIPAQUE) 350 MG/ML injection 75 mL (75 mLs Intravenous Contrast Given 08/22/22 1919)     IMPRESSION / MDM / ASSESSMENT AND PLAN / ED COURSE  I reviewed the triage vital signs and the nursing notes. Patient's presentation is most consistent with acute presentation with potential threat to life or bodily function.  Patient presents with chest pain as detailed above.  Differential includes ACS but also acute aortic syndrome.  Patient complains of pain between her shoulder blades.  She is a smoker has strong family history of CAD  EKG is overall reassuring, chest x-ray is without acute normality.  Pending high sensitive troponin  Will send for CT  angiography of the chest  ----------------------------------------- 7:54 PM on 08/22/2022 ----------------------------------------- CTA negative for dissection, patient well appearing, appropriate for d/c with cards f/u and strict return precautions      FINAL CLINICAL IMPRESSION(S) / ED DIAGNOSES   Final diagnoses:  Chest pain, unspecified type     Rx / DC Orders   ED Discharge Orders          Ordered    Ambulatory referral to Cardiology       Comments: If you have not heard from the Cardiology office within the next 72 hours please call [phone].   08/22/22 1954             Note:  This document was prepared using Dragon voice recognition software and may include unintentional dictation errors.   Lavonia Drafts, MD 08/22/22 [phone]

## 2022-08-22 NOTE — ED Notes (Signed)
Pt verbalized understanding of DC instructions. Signing pad did not work.   

## 2022-08-22 NOTE — Progress Notes (Signed)
BP 128/84   Pulse 80   Temp 99 F (37.2 C) (Oral)   Ht 5\' 1"  (1.549 m)   Wt 188 lb 11.2 oz (85.6 kg)   SpO2 97%   BMI 35.65 kg/m    Subjective:    Patient ID: Janet Murray, female    DOB: [birth year], 52 y.o.   MRN: [id number]  HPI: Janet Murray is a 52 y.o. female  Chief Complaint  Patient presents with   Sinus Problem    Patient says about 3 days ago she started having sinus drainage down in the back of her throat. Patient says now she is experiencing shoulder blade pain and says it is hard for to breathe. Patient decline having any coughing.    UPPER RESPIRATORY TRACT INFECTION Duration: 3 days Worst symptom: shoulder blade pain and chest pain Fever: no Cough: no Shortness of breath: no Wheezing: no Chest pain: yes Chest tightness: yes Chest congestion: no Nasal congestion: yes Runny nose: yes Post nasal drip: yes Sneezing: no Sore throat: no Swollen glands: no Sinus pressure: yes Headache: no Face pain: no Toothache: no Ear pain: no  Ear pressure: no  Eyes red/itching:no Eye drainage/crusting: no  Vomiting: no Rash: no Fatigue: yes Sick contacts: yes Strep contacts: no  Context: stable Recurrent sinusitis: no Relief with OTC cold/cough medications: no  Treatments attempted: inhalers  CHEST PAIN Time since onset: 2 days ago Onset: sudden Quality: sharp now dull Severity: mild Location: upper sternum Radiation: back and L arm Episode duration: constant Frequency: constant Related to exertion: yes Activity when pain started: normal activity Trauma: no Anxiety/recent stressors: no Status: stable Treatments attempted: nebulizer, inhalers  Current pain status: in pain Shortness of breath: yes Cough: no Nausea: no Diaphoresis: no Heartburn: no Palpitations: no    Relevant past medical, surgical, family and social history reviewed and updated as indicated. Interim medical history since our last visit reviewed. Allergies and medications  reviewed and updated.  Review of Systems  Constitutional: Negative.   Respiratory:  Positive for chest tightness and shortness of breath. Negative for apnea, cough, choking, wheezing and stridor.   Cardiovascular:  Positive for chest pain. Negative for palpitations and leg swelling.  Gastrointestinal: Negative.   Musculoskeletal: Negative.   Psychiatric/Behavioral: Negative.      Per HPI unless specifically indicated above     Objective:    BP 128/84   Pulse 80   Temp 99 F (37.2 C) (Oral)   Ht 5\' 1"  (1.549 m)   Wt 188 lb 11.2 oz (85.6 kg)   SpO2 97%   BMI 35.65 kg/m   Wt Readings from Last 3 Encounters:  08/22/22 188 lb 11.2 oz (85.6 kg)  08/07/22 188 lb 12.8 oz (85.6 kg)  08/06/22 185 lb (83.9 kg)    Physical Exam Vitals and nursing note reviewed.  Constitutional:      General: She is not in acute distress.    Appearance: Normal appearance. She is not ill-appearing, toxic-appearing or diaphoretic.  HENT:     Head: Normocephalic and atraumatic.     Right Ear: External ear normal.     Left Ear: External ear normal.     Nose: Nose normal.     Mouth/Throat:     Mouth: Mucous membranes are moist.     Pharynx: Oropharynx is clear.  Eyes:     General: No scleral icterus.       Right eye: No discharge.        Left eye:  No discharge.     Extraocular Movements: Extraocular movements intact.     Conjunctiva/sclera: Conjunctivae normal.     Pupils: Pupils are equal, round, and reactive to light.  Cardiovascular:     Rate and Rhythm: Normal rate and regular rhythm.     Pulses: Normal pulses.     Heart sounds: Normal heart sounds. No murmur heard.    No friction rub. No gallop.  Pulmonary:     Effort: Pulmonary effort is normal. No respiratory distress.     Breath sounds: Normal breath sounds. No stridor. No wheezing, rhonchi or rales.  Chest:     Chest wall: No tenderness.  Musculoskeletal:        General: Normal range of motion.     Cervical back: Normal range of  motion and neck supple.  Skin:    General: Skin is warm and dry.     Capillary Refill: Capillary refill takes less than 2 seconds.     Coloration: Skin is not jaundiced or pale.     Findings: No bruising, erythema, lesion or rash.  Neurological:     General: No focal deficit present.     Mental Status: She is alert and oriented to person, place, and time. Mental status is at baseline.  Psychiatric:        Mood and Affect: Mood normal.        Behavior: Behavior normal.        Thought Content: Thought content normal.        Judgment: Judgment normal.     Results for orders placed or performed in visit on 08/07/22  Nitric oxide  Result Value Ref Range   Nitric Oxide 5       Assessment & Plan:   Problem List Items Addressed This Visit   None Visit Diagnoses     Chest pain, unspecified type    -  Primary   2 days of chest pain radiating into back and L arm. EKG normal. Advised patient to go to ER as needs troponins which we cannot do here. Call to ER made.   Shoulder blade pain       Relevant Orders   DG Chest 2 View   EKG 12-Lead (Completed)        Follow up plan: Return if symptoms worsen or fail to improve.

## 2022-08-22 NOTE — Progress Notes (Signed)
Interpreted by me today. NSR at 70bpm with poor baseline but no ST segment changes. Call with any concerns.

## 2022-08-22 NOTE — ED Triage Notes (Signed)
Pt in via POV from doctors office c/o chest pain. Pt states 3 days ago she was experiencing drainage in the back if throat. Yesterday pt started to experience chest pain and states that she "feels like air" is getting in between her shoulder blades.

## 2022-08-26 ENCOUNTER — Telehealth: Payer: Self-pay

## 2022-08-26 NOTE — Telephone Encounter (Signed)
     Patient  visit on 3/15  at [place]    Have you been able to follow up with your primary care physician? Yes   The patient was or was not able to obtain any needed medicine or equipment. Yes   Are there diet recommendations that you are having difficulty following? Na   Patient expresses understanding of discharge instructions and education provided has no other needs at this time.  Yes      [facility] [phone] [address], [place], [place] [address] Phone: [phone] Email: [email or web address]

## 2022-08-29 ENCOUNTER — Ambulatory Visit
Admission: RE | Admit: 2022-08-29 | Discharge: 2022-08-29 | Disposition: A | Discharge status: Home / Self Care | Payer: 59 | Source: Ambulatory Visit | Attending: Nurse Practitioner | Admitting: Nurse Practitioner

## 2022-08-29 DIAGNOSIS — Z1239 Encounter for other screening for malignant neoplasm of breast: Secondary | ICD-10-CM | POA: Diagnosis not present

## 2022-08-29 DIAGNOSIS — N63 Unspecified lump in unspecified breast: Secondary | ICD-10-CM | POA: Insufficient documentation

## 2022-08-29 DIAGNOSIS — R928 Other abnormal and inconclusive findings on diagnostic imaging of breast: Secondary | ICD-10-CM | POA: Diagnosis not present

## 2022-08-29 DIAGNOSIS — R92313 Mammographic fatty tissue density, bilateral breasts: Secondary | ICD-10-CM | POA: Diagnosis not present

## 2022-08-29 DIAGNOSIS — N6002 Solitary cyst of left breast: Secondary | ICD-10-CM | POA: Diagnosis not present

## 2022-08-29 DIAGNOSIS — M179 Osteoarthritis of knee, unspecified: Secondary | ICD-10-CM | POA: Diagnosis not present

## 2022-09-01 NOTE — Progress Notes (Signed)
Please let patient know her Mammogram did not show any evidence of a malignancy.  The recommendation is to repeat the Mammogram in 1 year.  

## 2022-09-02 DIAGNOSIS — G9050 Complex regional pain syndrome I, unspecified: Secondary | ICD-10-CM | POA: Diagnosis not present

## 2022-09-02 DIAGNOSIS — Z5181 Encounter for therapeutic drug level monitoring: Secondary | ICD-10-CM | POA: Diagnosis not present

## 2022-09-08 ENCOUNTER — Ambulatory Visit
Admission: RE | Admit: 2022-09-08 | Discharge: 2022-09-08 | Disposition: A | Discharge status: Home / Self Care | Payer: 59 | Source: Ambulatory Visit | Attending: Student in an Organized Health Care Education/Training Program | Admitting: Student in an Organized Health Care Education/Training Program

## 2022-09-08 ENCOUNTER — Encounter: Payer: Self-pay | Admitting: Student in an Organized Health Care Education/Training Program

## 2022-09-08 ENCOUNTER — Ambulatory Visit
Payer: 59 | Attending: Student in an Organized Health Care Education/Training Program | Admitting: Student in an Organized Health Care Education/Training Program

## 2022-09-08 DIAGNOSIS — G894 Chronic pain syndrome: Secondary | ICD-10-CM | POA: Insufficient documentation

## 2022-09-08 DIAGNOSIS — M47816 Spondylosis without myelopathy or radiculopathy, lumbar region: Secondary | ICD-10-CM | POA: Insufficient documentation

## 2022-09-08 MED ORDER — ROPIVACAINE HCL 2 MG/ML IJ SOLN
9.0000 mL | Freq: Once | INTRAMUSCULAR | Status: AC
  Administered 2022-09-08: 9 mL via PERINEURAL
  Filled 2022-09-08: qty 20

## 2022-09-08 MED ORDER — DEXAMETHASONE SODIUM PHOSPHATE 10 MG/ML IJ SOLN
10.0000 mg | Freq: Once | INTRAMUSCULAR | Status: AC
  Administered 2022-09-08: 10 mg
  Filled 2022-09-08: qty 1

## 2022-09-08 MED ORDER — LIDOCAINE HCL 2 % IJ SOLN
20.0000 mL | Freq: Once | INTRAMUSCULAR | Status: AC
  Administered 2022-09-08: 400 mg
  Filled 2022-09-08: qty 20

## 2022-09-08 MED ORDER — LACTATED RINGERS IV SOLN: Freq: Once | INTRAVENOUS | Status: AC

## 2022-09-08 MED ORDER — MIDAZOLAM HCL 2 MG/2ML IJ SOLN
0.5000 mg | Freq: Once | INTRAMUSCULAR | Status: AC
  Administered 2022-09-08: 2 mg via INTRAVENOUS
  Filled 2022-09-08: qty 2

## 2022-09-08 NOTE — Patient Instructions (Signed)

## 2022-09-08 NOTE — Progress Notes (Signed)
PROVIDER NOTE: Interpretation of information contained herein should be left to medically-trained personnel. Specific patient instructions are provided elsewhere under "Patient Instructions" section of medical record. This document was created in part using STT-dictation technology, any transcriptional errors that may result from this process are unintentional.  Patient: Janet Murray Type: Established DOB: 1970/11/02 MRN: [id number] PCP: Jon Billings, NP  Service: Procedure DOS: 09/08/2022 Setting: Ambulatory Location: Ambulatory outpatient facility Delivery: Face-to-face Provider: Gillis Santa, MD Specialty: Interventional Pain Management Specialty designation: 09 Location: Outpatient facility Ref. Prov.: Gillis Santa, MD   Interventional Therapy     Procedure:           Type: Lumbar Facet, Medial Branch Block(s) #2  Laterality: Right  Level: L3, L4, and L5 Medial Branch Level(s). Injecting these levels blocks the L3-4 and L4-5 lumbar facet joints. Imaging: Fluoroscopic guidance         Anesthesia: Local anesthesia (1-2% Lidocaine) Anxiolysis: IV Versed         DOS: 09/08/2022 Performed by: Gillis Santa, MD  Primary Purpose: Diagnostic/Therapeutic Indications: Low back pain severe enough to impact quality of life or function. 1. Lumbar facet arthropathy   2. Chronic pain syndrome    NAS-11 Pain score:   Pre-procedure: 8 /10   Post-procedure: 0-No pain/10     Position / Prep / Materials:  Position: Prone  Prep solution: DuraPrep (Iodine Povacrylex [0.7% available iodine] and Isopropyl Alcohol, 74% w/w) Area Prepped: Posterolateral Lumbosacral Spine (Wide prep: From the lower border of the scapula down to the end of the tailbone and from flank to flank.)  Materials:  Tray: Block Needle(s):  Type: Spinal  Gauge (G): 22  Length: 3.5-in Qty: 2      Pre-op H&P Assessment:  Janet Murray is a 52 y.o. (year old), female patient, seen today for interventional treatment. She   has a past surgical history that includes Bladder surgery; Cesarean section; Appendectomy; Total abdominal hysterectomy w/ bilateral salpingoophorectomy; Esophagogastroduodenoscopy (04/26/2019); Stomach surgery; Colonoscopy with propofol (N/A, 04/11/2020); Thyroidectomy (N/A, 04/12/2021); Colonoscopy with propofol (N/A, 10/22/2021); and Abdominal hysterectomy. Janet Murray has a current medication list which includes the following prescription(s): albuterol, albuterol, AMBULATORY NON FORMULARY MEDICATION, atorvastatin, diclofenac sodium, esomeprazole, hydroxyzine, levothyroxine, victoza, montelukast, naloxone, ondansetron, oxycodone, pramipexole, tizanidine, vitamin d (ergocalciferol), and bevespi aerosphere, and the following Facility-Administered Medications: lactated ringers. Her primarily concern today is the Back Pain (Lower right side back)  Initial Vital Signs:  Pulse/HCG Rate: 67ECG Heart Rate: 73 Temp: (!) 97.3 F (36.3 C) Resp: 16 BP: 119/71 SpO2: 96 %  BMI: Estimated body mass index is 35.52 kg/m as calculated from the following:   Height as of this encounter: 5\' 1"  (1.549 m).   Weight as of this encounter: 188 lb (85.3 kg).  Risk Assessment: Allergies: Reviewed. She is allergic to penicillins, acetaminophen, ciprofloxacin, aspirin, hydrocodone, and tape.  Allergy Precautions: None required Coagulopathies: Reviewed. None identified.  Blood-thinner therapy: None at this time Active Infection(s): Reviewed. None identified. Janet Murray is afebrile  Site Confirmation: Janet Murray was asked to confirm the procedure and laterality before marking the site Procedure checklist: Completed Consent: Before the procedure and under the influence of no sedative(s), amnesic(s), or anxiolytics, the patient was informed of the treatment options, risks and possible complications. To fulfill our ethical and legal obligations, as recommended by the American Medical Association's Code of Ethics, I have  informed the patient of my clinical impression; the nature and purpose of the treatment or procedure; the risks, benefits, and possible complications of  the intervention; the alternatives, including doing nothing; the risk(s) and benefit(s) of the alternative treatment(s) or procedure(s); and the risk(s) and benefit(s) of doing nothing. The patient was provided information about the general risks and possible complications associated with the procedure. These may include, but are not limited to: failure to achieve desired goals, infection, bleeding, organ or nerve damage, allergic reactions, paralysis, and death. In addition, the patient was informed of those risks and complications associated to Spine-related procedures, such as failure to decrease pain; infection (i.e.: Meningitis, epidural or intraspinal abscess); bleeding (i.e.: epidural hematoma, subarachnoid hemorrhage, or any other type of intraspinal or peri-dural bleeding); organ or nerve damage (i.e.: Any type of peripheral nerve, nerve root, or spinal cord injury) with subsequent damage to sensory, motor, and/or autonomic systems, resulting in permanent pain, numbness, and/or weakness of one or several areas of the body; allergic reactions; (i.e.: anaphylactic reaction); and/or death. Furthermore, the patient was informed of those risks and complications associated with the medications. These include, but are not limited to: allergic reactions (i.e.: anaphylactic or anaphylactoid reaction(s)); adrenal axis suppression; blood sugar elevation that in diabetics may result in ketoacidosis or comma; water retention that in patients with history of congestive heart failure may result in shortness of breath, pulmonary edema, and decompensation with resultant heart failure; weight gain; swelling or edema; medication-induced neural toxicity; particulate matter embolism and blood vessel occlusion with resultant organ, and/or nervous system infarction; and/or  aseptic necrosis of one or more joints. Finally, the patient was informed that Medicine is not an exact science; therefore, there is also the possibility of unforeseen or unpredictable risks and/or possible complications that may result in a catastrophic outcome. The patient indicated having understood very clearly. We have given the patient no guarantees and we have made no promises. Enough time was given to the patient to ask questions, all of which were answered to the patient's satisfaction. Ms. Hertzberg has indicated that she wanted to continue with the procedure. Attestation: I, the ordering provider, attest that I have discussed with the patient the benefits, risks, side-effects, alternatives, likelihood of achieving goals, and potential problems during recovery for the procedure that I have provided informed consent. Date  Time: 09/08/2022 11:19 AM   Pre-Procedure Preparation:  Monitoring: As per clinic protocol. Respiration, ETCO2, SpO2, BP, heart rate and rhythm monitor placed and checked for adequate function Safety Precautions: Patient was assessed for positional comfort and pressure points before starting the procedure. Time-out: I initiated and conducted the "Time-out" before starting the procedure, as per protocol. The patient was asked to participate by confirming the accuracy of the "Time Out" information. Verification of the correct person, site, and procedure were performed and confirmed by me, the nursing staff, and the patient. "Time-out" conducted as per Joint Commission's Universal Protocol (UP.01.01.01). Time: 1136  Description of Procedure:          Laterality: (see above) Targeted Levels: (see above)  Safety Precautions: Aspiration looking for blood return was conducted prior to all injections. At no point did we inject any substances, as a needle was being advanced. Before injecting, the patient was told to immediately notify me if she was experiencing any new onset of "ringing  in the ears, or metallic taste in the mouth". No attempts were made at seeking any paresthesias. Safe injection practices and needle disposal techniques used. Medications properly checked for expiration dates. SDV (single dose vial) medications used. After the completion of the procedure, all disposable equipment used was discarded in the proper  designated Insurance risk surveyor. Local Anesthesia: Protocol guidelines were followed. The patient was positioned over the fluoroscopy table. The area was prepped in the usual manner. The time-out was completed. The target area was identified using fluoroscopy. A 12-in long, straight, sterile hemostat was used with fluoroscopic guidance to locate the targets for each level blocked. Once located, the skin was marked with an approved surgical skin marker. Once all sites were marked, the skin (epidermis, dermis, and hypodermis), as well as deeper tissues (fat, connective tissue and muscle) were infiltrated with a small amount of a short-acting local anesthetic, loaded on a 10cc syringe with a 25G, 1.5-in  Needle. An appropriate amount of time was allowed for local anesthetics to take effect before proceeding to the next step. Local Anesthetic: Lidocaine 2.0% The unused portion of the local anesthetic was discarded in the proper designated containers. Technical description of process:  L3 Medial Branch Nerve Block (MBB): The target area for the L3 medial branch is at the junction of the postero-lateral aspect of the superior articular process and the superior, posterior, and medial edge of the transverse process of L4. Under fluoroscopic guidance, a Quincke needle was inserted until contact was made with os over the superior postero-lateral aspect of the pedicular shadow (target area). After negative aspiration for blood, 42mL of the nerve block solution was injected without difficulty or complication. The needle was removed intact. L4 Medial Branch Nerve Block (MBB): The  target area for the L4 medial branch is at the junction of the postero-lateral aspect of the superior articular process and the superior, posterior, and medial edge of the transverse process of L5. Under fluoroscopic guidance, a Quincke needle was inserted until contact was made with os over the superior postero-lateral aspect of the pedicular shadow (target area). After negative aspiration for blood, 23mL of the nerve block solution was injected without difficulty or complication. The needle was removed intact. L5 Medial Branch Nerve Block (MBB): The target area for the L5 medial branch is at the junction of the postero-lateral aspect of the superior articular process and the superior, posterior, and medial edge of the sacral ala. Under fluoroscopic guidance, a Quincke needle was inserted until contact was made with os over the superior postero-lateral aspect of the pedicular shadow (target area). After negative aspiration for blood, 60mL of the nerve block solution was injected without difficulty or complication. The needle was removed intact.   Once the entire procedure was completed, the treated area was cleaned, making sure to leave some of the prepping solution back to take advantage of its long term bactericidal properties.         Illustration of the posterior view of the lumbar spine and the posterior neural structures. Laminae of L2 through S1 are labeled. DPRL5, dorsal primary ramus of L5; DPRS1, dorsal primary ramus of S1; DPR3, dorsal primary ramus of L3; FJ, facet (zygapophyseal) joint L3-L4; I, inferior articular process of L4; LB1, lateral branch of dorsal primary ramus of L1; IAB, inferior articular branches from L3 medial branch (supplies L4-L5 facet joint); IBP, intermediate branch plexus; MB3, medial branch of dorsal primary ramus of L3; NR3, third lumbar nerve root; S, superior articular process of L5; SAB, superior articular branches from L4 (supplies L4-5 facet joint also); TP3,  transverse process of L3.  Vitals:   09/08/22 1123 09/08/22 1138 09/08/22 1143 09/08/22 1147  BP: 115/66 109/71 98/61 119/76  Pulse:      Resp: 16 19 19    Temp:  TempSrc:      SpO2: 97% 94% 94% 98%  Weight:      Height:         Start Time: 1136 hrs. End Time: 1141 hrs.  Imaging Guidance (Spinal):          Type of Imaging Technique: Fluoroscopy Guidance (Spinal) Indication(s): Assistance in needle guidance and placement for procedures requiring needle placement in or near specific anatomical locations not easily accessible without such assistance. Exposure Time: Please see nurses notes. Contrast: None used. Fluoroscopic Guidance: I was personally present during the use of fluoroscopy. "Tunnel Vision Technique" used to obtain the best possible view of the target area. Parallax error corrected before commencing the procedure. "Direction-depth-direction" technique used to introduce the needle under continuous pulsed fluoroscopy. Once target was reached, antero-posterior, oblique, and lateral fluoroscopic projection used confirm needle placement in all planes. Images permanently stored in EMR. Interpretation: No contrast injected. I personally interpreted the imaging intraoperatively. Adequate needle placement confirmed in multiple planes. Permanent images saved into the patient's record.  Post-operative Assessment:  Post-procedure Vital Signs:  Pulse/HCG Rate: 6772 Temp: (!) 97.3 F (36.3 C) Resp: 19 BP: 119/76 SpO2: 98 %  EBL: None  Complications: No immediate post-treatment complications observed by team, or reported by patient.  Note: The patient tolerated the entire procedure well. A repeat set of vitals were taken after the procedure and the patient was kept under observation following institutional policy, for this type of procedure. Post-procedural neurological assessment was performed, showing return to baseline, prior to discharge. The patient was provided with  post-procedure discharge instructions, including a section on how to identify potential problems. Should any problems arise concerning this procedure, the patient was given instructions to immediately contact us, at any time, without hesitation. In any case, we plan to contact the patient by telephone for a follow-up status report regarding this interventional procedure.  Comments:  No additional relevant information.  Plan of Care  Orders:  Orders Placed This Encounter  Procedures   DG PAIN CLINIC C-ARM 1-60 MIN NO REPORT    Intraoperative interpretation by procedural physician at [facility].    Standing Status:   Standing    Number of Occurrences:   1    Order Specific Question:   Reason for exam:    Answer:   Assistance in needle guidance and placement for procedures requiring needle placement in or near specific anatomical locations not easily accessible without such assistance.     Medications ordered for procedure: Meds ordered this encounter  Medications   lidocaine (XYLOCAINE) 2 % (with pres) injection 400 mg   lactated ringers infusion   midazolam (VERSED) injection 0.5-2 mg    Make sure Flumazenil is available in the pyxis when using this medication. If oversedation occurs, administer 0.2 mg IV over 15 sec. If after 45 sec no response, administer 0.2 mg again over 1 min; may repeat at 1 min intervals; not to exceed 4 doses (1 mg)   dexamethasone (DECADRON) injection 10 mg   ropivacaine (PF) 2 mg/mL (0.2%) (NAROPIN) injection 9 mL   Medications administered: We administered lidocaine, lactated ringers, midazolam, dexamethasone, and ropivacaine (PF) 2 mg/mL (0.2%).  See the medical record for exact dosing, route, and time of administration.  Follow-up plan:   Return in about 4 weeks (around 10/06/2022), or PPE F2F.       Right L4/5 ESI 04/23/22: 20-25% pain relief for less than a week; right L3, L4, L5 medial branch nerve block 07/09/2022, 09/08/22  Right  SI-J/piriformis, right hip bursa injection    Recent Visits Date Type Provider Dept  08/06/22 Office Visit Gillis Santa, MD [facility]  07/09/22 Procedure visit Gillis Santa, MD [facility]  Showing recent visits within past 90 days and meeting all other requirements Today's Visits Date Type Provider Dept  09/08/22 Procedure visit Gillis Santa, MD [facility]  Showing today's visits and meeting all other requirements Future Appointments Date Type Provider Dept  10/06/22 Appointment Gillis Santa, MD [facility]  Showing future appointments within next 90 days and meeting all other requirements  Disposition: Discharge home  Discharge (Date  Time): 09/08/2022; 1151 hrs.   Primary Care Physician: Jon Billings, NP Location: [facility] Note by: Gillis Santa, MD Date: 09/08/2022; Time: 11:58 AM  Disclaimer:  Medicine is not an exact science. The only guarantee in medicine is that nothing is guaranteed. It is important to note that the decision to proceed with this intervention was based on the information collected from the patient. The Data and conclusions were drawn from the patient's questionnaire, the interview, and the physical examination. Because the information was provided in large part by the patient, it cannot be guaranteed that it has not been purposely or unconsciously manipulated. Every effort has been made to obtain as much relevant data as possible for this evaluation. It is important to note that the conclusions that lead to this procedure are derived in large part from the available data. Always take into account that the treatment will also be dependent on availability of resources and existing treatment guidelines, considered by other Pain Management Practitioners as being common knowledge and practice, at the time of the intervention. For Medico-Legal purposes, it is also important to point out that  variation in procedural techniques and pharmacological choices are the acceptable norm. The indications, contraindications, technique, and results of the above procedure should only be interpreted and judged by a Board-Certified Interventional Pain Specialist with extensive familiarity and expertise in the same exact procedure and technique.

## 2022-09-08 NOTE — Progress Notes (Signed)
Safety precautions to be maintained throughout the outpatient stay will include: orient to surroundings, keep bed in low position, maintain call bell within reach at all times, provide assistance with transfer out of bed and ambulation.  

## 2022-09-09 ENCOUNTER — Other Ambulatory Visit: Payer: Self-pay | Admitting: *Deleted

## 2022-09-09 NOTE — Telephone Encounter (Signed)
Attempted to call for post procedure follow-up. Message left. 

## 2022-09-11 ENCOUNTER — Telehealth: Payer: Self-pay | Admitting: Student in an Organized Health Care Education/Training Program

## 2022-09-11 NOTE — Telephone Encounter (Signed)
PT called back stated that she is doing fine since the procedure. TY

## 2022-09-23 ENCOUNTER — Other Ambulatory Visit: Payer: Self-pay | Admitting: Nurse Practitioner

## 2022-09-23 DIAGNOSIS — K219 Gastro-esophageal reflux disease without esophagitis: Secondary | ICD-10-CM

## 2022-09-24 NOTE — Telephone Encounter (Signed)
Requested Prescriptions  Pending Prescriptions Disp Refills   esomeprazole (NEXIUM) 40 MG capsule [Pharmacy Med Name: ESOMEPRAZOLE MAGNESIUM 40 MG CAP] 180 capsule 0    Sig: TAKE ONE CAPSULE BY MOUTH TWICE A DAY     Gastroenterology: Proton Pump Inhibitors 2 Passed - 09/23/2022  5:52 PM      Passed - ALT in normal range and within 360 days    ALT  Date Value Ref Range Status  07/10/2022 7 0 - 32 IU/L Final   SGPT (ALT)  Date Value Ref Range Status  10/29/2013 13 12 - 78 U/L Final         Passed - AST in normal range and within 360 days    AST  Date Value Ref Range Status  07/10/2022 10 0 - 40 IU/L Final   SGOT(AST)  Date Value Ref Range Status  10/29/2013 17 15 - 37 Unit/L Final         Passed - Valid encounter within last 12 months    Recent Outpatient Visits           1 month ago Chest pain, unspecified type   [place] [facility] Villareal, Megan P, DO   2 months ago Encounter for annual wellness exam in Medicare patient   [place] [facility] Larae Grooms, NP   3 months ago Acute cystitis with hematuria   [place] [facility] Larae Grooms, NP   9 months ago Vitamin D deficiency   [place] [facility] Larae Grooms, NP   9 months ago Osteoarthritis of spine with radiculopathy, lumbar region   [place] [facility] Larae Grooms, NP       Future Appointments             In 3 weeks Agbor-Etang, Arlys John, MD [facility] [place]   In 3 months Mecum, Oswaldo Conroy, PA-C [facility]             pramipexole (MIRAPEX) 0.5 MG tablet [Pharmacy Med Name: PRAMIPEXOLE DIHYDROCHLORIDE 0.5 MG] 270 tablet 0    Sig: TAKE 1 TABLET BY MOUTH 3 TIMES DAILY     Neurology:  Parkinsonian Agents Passed - 09/23/2022  5:52 PM      Passed - Last BP in normal range    BP Readings from Last 1 Encounters:  09/08/22 119/76         Passed - Last Heart  Rate in normal range    Pulse Readings from Last 1 Encounters:  09/08/22 67         Passed - Valid encounter within last 12 months    Recent Outpatient Visits           1 month ago Chest pain, unspecified type   [place] [facility] [place], Megan P, DO   2 months ago Encounter for annual wellness exam in Medicare patient   [place] [facility] Larae Grooms, NP   3 months ago Acute cystitis with hematuria   [place] [facility] Larae Grooms, NP   9 months ago Vitamin D deficiency   [place] [facility] Larae Grooms, NP   9 months ago Osteoarthritis of spine with radiculopathy, lumbar region   [facility] Larae Grooms, NP       Future Appointments             In 3 weeks Azucena Cecil, Arlys John, MD [facility] at [facility]   In 3 months Mecum, Oswaldo Conroy, PA-C [facility]

## 2022-10-02 ENCOUNTER — Ambulatory Visit: Payer: 59 | Attending: Pulmonary Disease

## 2022-10-02 DIAGNOSIS — J449 Chronic obstructive pulmonary disease, unspecified: Secondary | ICD-10-CM | POA: Diagnosis not present

## 2022-10-02 LAB — PULMONARY FUNCTION TEST ARMC ONLY
DL/VA % pred: 96 %
DL/VA: 4.23 ml/min/mmHg/L
DLCO unc % pred: 95 %
DLCO unc: 18.05 ml/min/mmHg
FEF 25-75 Post: 1.5 L/sec
FEF 25-75 Pre: 0.82 L/sec
FEF2575-%Change-Post: 82 %
FEF2575-%Pred-Post: 59 %
FEF2575-%Pred-Pre: 32 %
FEV1-%Change-Post: 28 %
FEV1-%Pred-Post: 62 %
FEV1-%Pred-Pre: 48 %
FEV1-Post: 1.54 L
FEV1-Pre: 1.2 L
FEV1FVC-%Change-Post: 3 %
FEV1FVC-%Pred-Pre: 85 %
FEV6-%Change-Post: 23 %
FEV6-%Pred-Post: 71 %
FEV6-%Pred-Pre: 57 %
FEV6-Post: 2.17 L
FEV6-Pre: 1.76 L
FEV6FVC-%Pred-Post: 102 %
FEV6FVC-%Pred-Pre: 102 %
FVC-%Change-Post: 23 %
FVC-%Pred-Post: 69 %
FVC-%Pred-Pre: 55 %
FVC-Post: 2.17 L
FVC-Pre: 1.76 L
Post FEV1/FVC ratio: 71 %
Post FEV6/FVC ratio: 100 %
Pre FEV1/FVC ratio: 68 %
Pre FEV6/FVC Ratio: 100 %
RV % pred: 192 %
RV: 3.22 L
TLC % pred: 122 %
TLC: 5.65 L

## 2022-10-02 MED ORDER — ALBUTEROL SULFATE (2.5 MG/3ML) 0.083% IN NEBU
2.5000 mg | INHALATION_SOLUTION | Freq: Once | RESPIRATORY_TRACT | Status: AC
  Administered 2022-10-02: 2.5 mg via RESPIRATORY_TRACT

## 2022-10-06 ENCOUNTER — Encounter: Payer: Self-pay | Admitting: Student in an Organized Health Care Education/Training Program

## 2022-10-06 ENCOUNTER — Ambulatory Visit
Payer: 59 | Attending: Student in an Organized Health Care Education/Training Program | Admitting: Student in an Organized Health Care Education/Training Program

## 2022-10-06 VITALS — BP 127/84 | HR 96 | Temp 97.4°F | Resp 18 | Ht 61.0 in | Wt 185.0 lb

## 2022-10-06 DIAGNOSIS — M47816 Spondylosis without myelopathy or radiculopathy, lumbar region: Secondary | ICD-10-CM

## 2022-10-06 DIAGNOSIS — G894 Chronic pain syndrome: Secondary | ICD-10-CM

## 2022-10-06 NOTE — Patient Instructions (Signed)

## 2022-10-06 NOTE — Progress Notes (Signed)
PROVIDER NOTE: Information contained herein reflects review and annotations entered in association with encounter. Interpretation of such information and data should be left to medically-trained personnel. Information provided to patient can be located elsewhere in the medical record under "Patient Instructions". Document created using STT-dictation technology, any transcriptional errors that may result from process are unintentional.    Patient: Janet Murray  Service Category: E/M  Provider: Edward Jolly, MD  DOB: 09-05-1970  DOS: 10/06/2022  Referring Provider: Larae Grooms, NP  MRN: [id number]  Specialty: Interventional Pain Management  PCP: Larae Grooms, NP  Type: Established Patient  Setting: Ambulatory outpatient    Location: Office  Delivery: Face-to-face     HPI  Janet Murray, a 52 y.o. year old female, is here today because of her Lumbar facet arthropathy [M47.816]. Janet Murray primary complain today is Back Pain (Low right)  Pertinent problems: Janet Murray has Osteoarthritis of spine with radiculopathy, lumbar region; Lumbar facet arthropathy; Sacroiliac joint dysfunction of both sides; Other intervertebral disc degeneration, lumbar region; and Hereditary and idiopathic neuropathy on their pertinent problem list. Pain Assessment: Severity of Chronic pain is reported as a 6 /10. Location: Back Right/radiates down to the top of foot. Onset: More than a month ago. Quality: Aching, Constant. Timing: Constant. Modifying factor(s): meds, ice, heat, proc. Vitals:  height is 5\' 1"  (1.549 m) and weight is 185 lb (83.9 kg). Her temperature is 97.4 F (36.3 C) (abnormal). Her blood pressure is 127/84 and her pulse is 96. Her respiration is 18 and oxygen saturation is 98%.  BMI: Estimated body mass index is 34.96 kg/m as calculated from the following:   Height as of this encounter: 5\' 1"  (1.549 m).   Weight as of this encounter: 185 lb (83.9 kg). Last encounter: 08/06/2022. Last procedure:  09/08/2022.  Reason for encounter: post-procedure evaluation and assessment.    Post-procedure evaluation   Type: Lumbar Facet, Medial Branch Block(s) #2  Laterality: Right  Level: L3, L4, and L5 Medial Branch Level(s). Injecting these levels blocks the L3-4 and L4-5 lumbar facet joints. Imaging: Fluoroscopic guidance         Anesthesia: Local anesthesia (1-2% Lidocaine) Anxiolysis: IV Versed         DOS: 09/08/2022 Performed by: Edward Jolly, MD  Primary Purpose: Diagnostic/Therapeutic Indications: Low back pain severe enough to impact quality of life or function. 1. Lumbar facet arthropathy   2. Chronic pain syndrome    NAS-11 Pain score:   Pre-procedure: 8 /10   Post-procedure: 0-No pain/10      Effectiveness: Initial hour after procedure: 100 % Subsequent 4-6 hours post-procedure: 100 %  Analgesia past initial 6 hours: 75 % (lasting  2 weeks)  Ongoing improvement:  Analgesic:  <20% Function: Back to baseline ROM: Back to baseline   ROS  Constitutional: Denies any fever or chills Gastrointestinal: No reported hemesis, hematochezia, vomiting, or acute GI distress Musculoskeletal:  + low back pain Neurological: No reported episodes of acute onset apraxia, aphasia, dysarthria, agnosia, amnesia, paralysis, loss of coordination, or loss of consciousness  Medication Review  AMBULATORY NON FORMULARY MEDICATION, Glycopyrrolate-Formoterol, Vitamin D (Ergocalciferol), albuterol, atorvastatin, diclofenac Sodium, esomeprazole, hydrOXYzine, levothyroxine, liraglutide, montelukast, naloxone, ondansetron, oxyCODONE, pramipexole, and tiZANidine  History Review  Allergy: Janet Murray is allergic to penicillins, acetaminophen, ciprofloxacin, aspirin, hydrocodone, and tape. Drug: Janet Murray  reports no history of drug use. Alcohol:  reports that she does not currently use alcohol. Tobacco:  reports that she has been smoking cigarettes. She has a 12.50  pack-year smoking history. She has  never used smokeless tobacco. Social: Janet Murray  reports that she has been smoking cigarettes. She has a 12.50 pack-year smoking history. She has never used smokeless tobacco. She reports that she does not currently use alcohol. She reports that she does not use drugs. Medical:  has a past medical history of Abdominal mass, RUQ (right upper quadrant), Anxiety, Arthritis, Asthma, Bronchitis, Cancer (HCC) (04/12/2021), Carpal tunnel syndrome of left wrist (10/25/2014), Chronic bronchitis, obstructive (07/20/2017), Chronic pain, Colon cancer (HCC), COPD (chronic obstructive pulmonary disease) (HCC), Coronary artery disease, Cyst of right kidney (03/22/2018), DDD (degenerative disc disease), lumbar (10/25/2014), Degenerative joint disease (DJD) of hip, Diarrhea of infectious origin (03/25/2018), Dysphagia, Dyspnea, Emphysema of lung (HCC), Facet syndrome, lumbar (10/25/2014), Fatty liver (03/25/2018), Foot fracture, left (05/02/2015), 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), GERD (gastroesophageal reflux disease), Hereditary and idiopathic peripheral neuropathy (11/06/2014), Hypothyroidism, IBS (irritable bowel syndrome), Low back pain, Migraine (02/04/2016), Occipital neuralgia, Osteoarthritis of spine with radiculopathy, lumbar region (10/25/2014), Other intervertebral disc degeneration, lumbar region, Other irritable bowel syndrome (04/20/2017), Other spondylosis with radiculopathy, lumbar region (10/25/2014), RLS (restless legs syndrome), Sacroiliac joint dysfunction of both sides (10/25/2014), Spondylosis of lumbar spine, Stomach cancer (HCC), and Urinary incontinence. Surgical: Janet Murray  has a past surgical history that includes Bladder surgery; Cesarean section; Appendectomy; Total abdominal hysterectomy w/ bilateral salpingoophorectomy; Esophagogastroduodenoscopy (04/26/2019); Stomach surgery; Colonoscopy with propofol (N/A, 04/11/2020); Thyroidectomy (N/A, 04/12/2021); Colonoscopy with propofol (N/A, 10/22/2021);  and Abdominal hysterectomy. Family: family history includes Arthritis in her father; Dementia in her maternal grandmother; Healthy in her brother, brother, brother, daughter, and son; Heart disease in her father and maternal grandmother; Hypercholesterolemia in her maternal grandmother; Hypertension in her father and maternal grandmother; Leukemia in her father; Neuropathy in her mother.  Laboratory Chemistry Profile   Renal Lab Results  Component Value Date   BUN 7 08/22/2022   CREATININE 0.69 08/22/2022   BCR 12 07/10/2022   GFRAA 127 01/19/2020   GFRNONAA >60 08/22/2022    Hepatic Lab Results  Component Value Date   AST 10 07/10/2022   ALT 7 07/10/2022   ALBUMIN 4.5 07/10/2022   ALKPHOS 82 07/10/2022   LIPASE 25 06/28/2022    Electrolytes Lab Results  Component Value Date   NA 134 (L) 08/22/2022   K 3.7 08/22/2022   CL 102 08/22/2022   CALCIUM 9.0 08/22/2022    Bone Lab Results  Component Value Date   VD25OH 10.1 (L) 12/27/2021    Inflammation (CRP: Acute Phase) (ESR: Chronic Phase) No results found for: "CRP", "ESRSEDRATE", "LATICACIDVEN"       Note: Above Lab results reviewed.   Physical Exam  General appearance: Well nourished, well developed, and well hydrated. In no apparent acute distress Mental status: Alert, oriented x 3 (person, place, & time)       Respiratory: No evidence of acute respiratory distress Eyes: PERLA Vitals: BP 127/84   Pulse 96   Temp (!) 97.4 F (36.3 C)   Resp 18   Ht 5\' 1"  (1.549 m)   Wt 185 lb (83.9 kg)   SpO2 98%   BMI 34.96 kg/m  BMI: Estimated body mass index is 34.96 kg/m as calculated from the following:   Height as of this encounter: 5\' 1"  (1.549 m).   Weight as of this encounter: 185 lb (83.9 kg). Ideal: Ideal body weight: 47.8 kg (105 lb 6.1 oz) Adjusted ideal body weight: 62.2 kg (137 lb 3.6 oz)  Lumbar Spine Area Exam  Skin & Axial  Inspection: No masses, redness, or swelling Alignment: Symmetrical Functional  ROM: Pain restricted ROM affecting primarily the right Stability: No instability detected Muscle Tone/Strength: Functionally intact. No obvious neuro-muscular anomalies detected. Sensory (Neurological): Musculoskeletal pain pattern Palpation: No palpable anomalies       Provocative Tests: Hyperextension/rotation test: (+) on the right for facet joint pain. Lumbar quadrant test (Kemp's test): (+) on the right for facet joint pain.   Gait & Posture Assessment  Ambulation: Unassisted Gait: Relatively normal for age and body habitus Posture: WNL  Lower Extremity Exam      Side: Right lower extremity   Side: Left lower extremity  Stability: No instability observed           Stability: No instability observed          Skin & Extremity Inspection: Skin color, temperature, and hair growth are WNL. No peripheral edema or cyanosis. No masses, redness, swelling, asymmetry, or associated skin lesions. No contractures.   Skin & Extremity Inspection: Skin color, temperature, and hair growth are WNL. No peripheral edema or cyanosis. No masses, redness, swelling, asymmetry, or associated skin lesions. No contractures.  Functional ROM: Unrestricted ROM                   Functional ROM: Unrestricted ROM                  Muscle Tone/Strength: Functionally intact. No obvious neuro-muscular anomalies detected.   Muscle Tone/Strength: Functionally intact. No obvious neuro-muscular anomalies detected.  Sensory (Neurological): Unimpaired         Sensory (Neurological): Unimpaired        DTR: Patellar: deferred today Achilles: deferred today Plantar: deferred today   DTR: Patellar: deferred today Achilles: deferred today Plantar: deferred today  Palpation: No palpable anomalies   Palpation: No palpable anomalies     Assessment   Diagnosis Status  1. Lumbar facet arthropathy   2. Lumbar spondylosis (right)   3. Chronic pain syndrome    Responding Responding Controlled   Updated Problems: Problem   Lumbar Spondylosis    Plan of Care  Janet Murray is status post 2 positive diagnostic lumbar facet medial branch nerve blocks both of which provided her with 70 to 75% pain relief for 10 to 14 days respectively.  We discussed lumbar radiofrequency ablation on the right side for the purpose of obtaining longer-term pain relief.  Risk and benefits reviewed and patient would like to proceed.   Orders:  Orders Placed This Encounter  Procedures   Radiofrequency,Lumbar    Standing Status:   Future    Standing Expiration Date:   01/05/2023    Scheduling Instructions:     Side(s): RIGHT     Level(s): L3, L4, L5,  Medial Branch Nerve(s)     Sedation: With Sedation     Scheduling Timeframe: As soon as pre-approved    Order Specific Question:   Where will this procedure be performed?    Answer:   [facility]   Follow-up plan:   Return in about 2 weeks (around 10/20/2022) for Right L3, 4, 5 RFA, in clinic IV Versed.      Right L4/5 ESI 04/23/22: 20-25% pain relief for less than a week; right L3, L4, L5 medial branch nerve block 07/09/2022, 09/08/22  Right SI-J/piriformis, right hip bursa injection      Recent Visits Date Type Provider Dept  09/08/22 Procedure visit Edward Jolly, MD [facility]  08/06/22 Office Visit Edward Jolly, MD  [facility]  07/09/22 Procedure visit Edward Jolly, MD [facility]  Showing recent visits within past 90 days and meeting all other requirements Today's Visits Date Type Provider Dept  10/06/22 Office Visit Edward Jolly, MD [facility]  Showing today's visits and meeting all other requirements Future Appointments No visits were found meeting these conditions. Showing future appointments within next 90 days and meeting all other requirements  I discussed the assessment and treatment plan with the patient. The patient was provided an opportunity to ask questions and all were answered. The patient agreed with the  plan and demonstrated an understanding of the instructions.  Patient advised to call back or seek an in-person evaluation if the symptoms or condition worsens.  Duration of encounter: .  Total time on encounter, as per AMA guidelines included both the face-to-face and non-face-to-face time personally spent by the physician and/or other qualified health care professional(s) on the day of the encounter (includes time in activities that require the physician or other qualified health care professional and does not include time in activities normally performed by clinical staff). Physician's time may include the following activities when performed: Preparing to see the patient (e.g., pre-charting review of records, searching for previously ordered imaging, lab work, and nerve conduction tests) Review of prior analgesic pharmacotherapies. Reviewing PMP Interpreting ordered tests (e.g., lab work, imaging, nerve conduction tests) Performing post-procedure evaluations, including interpretation of diagnostic procedures Obtaining and/or reviewing separately obtained history Performing a medically appropriate examination and/or evaluation Counseling and educating the patient/family/caregiver Ordering medications, tests, or procedures Referring and communicating with other health care professionals (when not separately reported) Documenting clinical information in the electronic or other health record Independently interpreting results (not separately reported) and communicating results to the patient/ family/caregiver Care coordination (not separately reported)  Note by: Edward Jolly, MD Date: 10/06/2022; Time: 2:33 PM

## 2022-10-06 NOTE — Progress Notes (Signed)
Safety precautions to be maintained throughout the outpatient stay will include: orient to surroundings, keep bed in low position, maintain call bell within reach at all times, provide assistance with transfer out of bed and ambulation.  

## 2022-10-07 DIAGNOSIS — G894 Chronic pain syndrome: Secondary | ICD-10-CM | POA: Diagnosis not present

## 2022-10-07 DIAGNOSIS — Z79891 Long term (current) use of opiate analgesic: Secondary | ICD-10-CM | POA: Diagnosis not present

## 2022-10-07 DIAGNOSIS — Z5181 Encounter for therapeutic drug level monitoring: Secondary | ICD-10-CM | POA: Diagnosis not present

## 2022-10-07 DIAGNOSIS — G9050 Complex regional pain syndrome I, unspecified: Secondary | ICD-10-CM | POA: Diagnosis not present

## 2022-10-09 ENCOUNTER — Ambulatory Visit: Payer: 59 | Admitting: Pulmonary Disease

## 2022-10-16 ENCOUNTER — Ambulatory Visit: Payer: 59 | Attending: Cardiology | Admitting: Cardiology

## 2022-10-16 ENCOUNTER — Encounter: Payer: Self-pay | Admitting: *Deleted

## 2022-10-16 ENCOUNTER — Encounter: Payer: Self-pay | Admitting: Cardiology

## 2022-10-16 VITALS — BP 132/82 | HR 70 | Ht 61.0 in | Wt 189.0 lb

## 2022-10-16 DIAGNOSIS — E7800 Pure hypercholesterolemia, unspecified: Secondary | ICD-10-CM | POA: Diagnosis not present

## 2022-10-16 DIAGNOSIS — R079 Chest pain, unspecified: Secondary | ICD-10-CM

## 2022-10-16 DIAGNOSIS — F17200 Nicotine dependence, unspecified, uncomplicated: Secondary | ICD-10-CM | POA: Diagnosis not present

## 2022-10-16 DIAGNOSIS — R072 Precordial pain: Secondary | ICD-10-CM

## 2022-10-16 MED ORDER — IVABRADINE HCL 5 MG PO TABS: 10.0000 mg | ORAL_TABLET | Freq: Once | ORAL | 0 refills | Status: AC

## 2022-10-16 MED ORDER — METOPROLOL TARTRATE 100 MG PO TABS: 100.0000 mg | ORAL_TABLET | Freq: Once | ORAL | 0 refills | Status: DC

## 2022-10-16 NOTE — Patient Instructions (Signed)
Medication Instructions:   Your physician recommends that you continue on your current medications as directed. Please refer to the Current Medication list given to you today.  *If you need a refill on your cardiac medications before your next appointment, please call your pharmacy*   Lab Work:  None Ordered  If you have labs (blood work) drawn today and your tests are completely normal, you will receive your results only by: MyChart Message (if you have MyChart) OR A paper copy in the mail If you have any lab test that is abnormal or we need to change your treatment, we will call you to review the results.   Testing/Procedures:  Your physician has requested that you have an echocardiogram. Echocardiography is a painless test that uses sound waves to create images of your heart. It provides your doctor with information about the size and shape of your heart and how well your heart's chambers and valves are working. This procedure takes approximately one hour. There are no restrictions for this procedure. Please do NOT wear cologne, perfume, aftershave, or lotions (deodorant is allowed). Please arrive 15 minutes prior to your appointment time.    Your cardiac CT will be scheduled at   [facility] [address] [place], [place] [address] [phone]  OR   [facility] [address] [place], [place] [address] [phone]  If scheduled at [facility] or [facility], please arrive 15 mins early for check-in and test prep.   Please follow these instructions carefully (unless otherwise directed):  On the Night Before the Test: Be sure to Drink plenty of water. Do not consume any caffeinated/decaffeinated beverages or chocolate 12 hours prior to your test. Do not take any antihistamines 12 hours prior to your test.  On the Day of the Test: Drink plenty of  water until 1 hour prior to the test. Do not eat any food 1 hour prior to test. You may take your regular medications prior to the test.  Take metoprolol (Lopressor) two hours prior to test. Take Corlanor two hours prior to test FEMALES- please wear underwire-free bra if available, avoid dresses & tight clothing      After the Test: Drink plenty of water. After receiving IV contrast, you may experience a mild flushed feeling. This is normal. On occasion, you may experience a mild rash up to 24 hours after the test. This is not dangerous. If this occurs, you can take Benadryl 25 mg and increase your fluid intake. If you experience trouble breathing, this can be serious. If it is severe call 911 IMMEDIATELY. If it is mild, please call our office.  We will call to schedule your test 2-4 weeks out understanding that some insurance companies will need an authorization prior to the service being performed.   For non-scheduling related questions, please contact the cardiac imaging nurse navigator should you have any questions/concerns: Rockwell Alexandria, Cardiac Imaging Nurse Navigator Larey Brick, Cardiac Imaging Nurse Navigator [facility] Direct Office Dial: [phone]   For scheduling needs, including cancellations and rescheduling, please call [place], [phone].   Follow-Up: At [facility], you and your health needs are our priority.  As part of our continuing mission to provide you with exceptional heart care, we have created designated Provider Care Teams.  These Care Teams include your primary Cardiologist (physician) and Advanced Practice Providers (APPs -  Physician Assistants and Nurse Practitioners) who  all work together to provide you with the care you need, when you need it.  We recommend signing up for the patient portal called "MyChart".  Sign up information is provided on this After Visit Summary.  MyChart is used to connect with patients  for Virtual Visits (Telemedicine).  Patients are able to view lab/test results, encounter notes, upcoming appointments, etc.  Non-urgent messages can be sent to your provider as well.   To learn more about what you can do with MyChart, go to [email or web address].    Your next appointment:    After testing  Provider:   You may see Debbe Odea, MD or one of the following Advanced Practice Providers on your designated Care Team:   Nicolasa Ducking, NP Eula Listen, PA-C Cadence Fransico Michael, PA-C Charlsie Quest, NP

## 2022-10-16 NOTE — Progress Notes (Signed)
Cardiology Office Note:    Date:  10/16/2022   ID:  Janet Murray, DOB 1970/07/25, MRN [id number]  PCP:  Larae Grooms, NP   [facility] Providers Cardiologist:  Debbe Odea, MD     Referring MD: Jene Every, MD   Chief Complaint  Patient presents with   New Patient (Initial Visit)    Janet Murray to PCP for chest pains and was sent to ED on 08/22/22.  No more chest pain episodes since ED visit.      History of Present Illness:    Janet Murray is a 52 y.o. female with a hx of hyperlipidemia, current smoker x 20+ years who presents due to chest pain.  Patient was at home watching TV when she developed an episode of chest pain about 6 weeks ago.  Describes pain as pressure-like, persisted for couple of minutes and then spontaneously resolved.  She called her PCP who recommended she go to the ED.  Workup in the ED was unrevealing.  Patient denies any further episodes.  Heart a heart attack in his 60s.  She currently smokes.  Denies breathing issues.  Past Medical History:  Diagnosis Date   Abdominal mass, RUQ (right upper quadrant)    Anxiety    Arthritis    Asthma    Bronchitis    Cancer (HCC) 04/12/2021   Carpal tunnel syndrome of left wrist 10/25/2014   Chronic bronchitis, obstructive 07/20/2017   Chronic pain    Colon cancer (HCC)    COPD (chronic obstructive pulmonary disease) (HCC)    Coronary artery disease    Cyst of right kidney 03/22/2018   DDD (degenerative disc disease), lumbar 10/25/2014   Degenerative joint disease (DJD) of hip    Diarrhea of infectious origin 03/25/2018   Dysphagia    Dyspnea    Emphysema of lung (HCC)    Facet syndrome, lumbar 10/25/2014   Fatty liver 03/25/2018   Foot fracture, left 05/02/2015   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   GERD (gastroesophageal reflux disease)    Hereditary and idiopathic peripheral neuropathy 11/06/2014   Hypothyroidism    IBS (irritable bowel syndrome)    Low back pain    Migraine 02/04/2016    Occipital neuralgia    Osteoarthritis of spine with radiculopathy, lumbar region 10/25/2014   Other intervertebral disc degeneration, lumbar region    Other irritable bowel syndrome 04/20/2017   Other spondylosis with radiculopathy, lumbar region 10/25/2014   RLS (restless legs syndrome)    Sacroiliac joint dysfunction of both sides 10/25/2014   Spondylosis of lumbar spine    Stomach cancer (HCC)    Urinary incontinence     Past Surgical History:  Procedure Laterality Date   ABDOMINAL HYSTERECTOMY     APPENDECTOMY     BLADDER SURGERY     sling - Dr. Barnabas Lister   CESAREAN SECTION     COLONOSCOPY WITH PROPOFOL N/A 04/11/2020   Procedure: COLONOSCOPY WITH PROPOFOL;  Surgeon: Pasty Spillers, MD;  Location: [facility] ENDOSCOPY;  Service: Endoscopy;  Laterality: N/A;   COLONOSCOPY WITH PROPOFOL N/A 10/22/2021   Procedure: COLONOSCOPY WITH PROPOFOL;  Surgeon: Toney Reil, MD;  Location: [facility] ENDOSCOPY;  Service: Gastroenterology;  Laterality: N/A;   ESOPHAGOGASTRODUODENOSCOPY  04/26/2019   STOMACH SURGERY     THYROIDECTOMY N/A 04/12/2021   Procedure: THYROIDECTOMY, total;  Surgeon: Leafy Ro, MD;  Location: [facility] ORS;  Service: General;  Laterality: N/A;   TOTAL ABDOMINAL HYSTERECTOMY W/ BILATERAL SALPINGOOPHORECTOMY      Current  Medications: Current Meds  Medication Sig   albuterol (PROVENTIL) (2.5 MG/3ML) 0.083% nebulizer solution Take 3 mLs (2.5 mg total) by nebulization every 6 (six) hours as needed for wheezing or shortness of breath.   albuterol (VENTOLIN HFA) 108 (90 Base) MCG/ACT inhaler Inhale 2 puffs into the lungs every 6 (six) hours as needed for wheezing or shortness of breath.   AMBULATORY NON FORMULARY MEDICATION Medication Name: nebulizer DX: J44.9   atorvastatin (LIPITOR) 80 MG tablet TAKE 1 TABLET BY MOUTH DAILY   diclofenac Sodium (VOLTAREN) 1 % GEL Apply 1 application topically daily as needed (Knee pain).   esomeprazole (NEXIUM) 40 MG capsule TAKE ONE  CAPSULE BY MOUTH TWICE A DAY   Glycopyrrolate-Formoterol (BEVESPI AEROSPHERE) 9-4.8 MCG/ACT AERO 2 puffs twice a day   hydrOXYzine (ATARAX/VISTARIL) 25 MG tablet Take 1 tablet (25 mg total) by mouth 3 (three) times daily as needed. (Patient taking differently: Take 25 mg by mouth 3 (three) times daily as needed for anxiety.)   ivabradine (CORLANOR) 5 MG TABS tablet Take 2 tablets (10 mg total) by mouth once for 1 dose. TWO HOURS PRIOR TO CARDIAC CTA   levothyroxine (SYNTHROID) 175 MCG tablet Take 1 tablet (175 mcg total) by mouth daily.   liraglutide (VICTOZA) 18 MG/3ML SOPN INJECT 1.2 MG INTO THE SKIN DAILY   metoprolol tartrate (LOPRESSOR) 100 MG tablet Take 1 tablet (100 mg total) by mouth once for 1 dose. TWO HOURS PRIOR TO CARDIAC CTA   montelukast (SINGULAIR) 10 MG tablet TAKE 1 TABLET BY MOUTH AT BEDTIME   naloxone (NARCAN) nasal spray 4 mg/0.1 mL SMARTSIG:Both Nares   ondansetron (ZOFRAN-ODT) 4 MG disintegrating tablet TAKE 1 TABLET BY MOUTH EVERY 8 HOURS AS NEEDED   oxyCODONE (OXY IR/ROXICODONE) 5 MG immediate release tablet Take 1 tablet (5 mg total) by mouth every 4 (four) hours as needed for severe pain.   pramipexole (MIRAPEX) 0.5 MG tablet TAKE 1 TABLET BY MOUTH 3 TIMES DAILY   tiZANidine (ZANAFLEX) 2 MG tablet Take 2 mg by mouth 2 (two) times daily.   Vitamin D, Ergocalciferol, (DRISDOL) 1.25 MG (50000 UNIT) CAPS capsule Take 1 capsule (50,000 Units total) by mouth every 7 (seven) days.     Allergies:   Penicillins, Acetaminophen, Ciprofloxacin, Aspirin, Hydrocodone, and Tape   Social History   Socioeconomic History   Marital status: Single    Spouse name: Not on file   Number of children: 2   Years of education: Not on file   Highest education level: Not on file  Occupational History   Not on file  Tobacco Use   Smoking status: Every Day    Packs/day: 0.50    Years: 25.00    Additional pack years: 0.00    Total pack years: 12.50    Types: Cigarettes   Smokeless  tobacco: Never   Tobacco comments:    0.5PPD 08/07/2022  Vaping Use   Vaping Use: Never used  Substance and Sexual Activity   Alcohol use: Not Currently    Comment: occassionally   Drug use: No   Sexual activity: Not Currently    Birth control/protection: None  Other Topics Concern   Not on file  Social History Narrative   Lives alone   Social Determinants of Health   Financial Resource Strain: Low Risk  (03/05/2021)   Overall Financial Resource Strain (CARDIA)    Difficulty of Paying Living Expenses: Not hard at all  Food Insecurity: No Food Insecurity (03/05/2021)   Hunger Vital Sign  Worried About Programme researcher, broadcasting/film/video in the Last Year: Never true    Ran Out of Food in the Last Year: Never true  Transportation Needs: No Transportation Needs (03/05/2021)   PRAPARE - Administrator, Civil Service (Medical): No    Lack of Transportation (Non-Medical): No  Physical Activity: Insufficiently Active (03/05/2021)   Exercise Vital Sign    Days of Exercise per Week: 3 days    Minutes of Exercise per Session: 20 min  Stress: No Stress Concern Present (03/05/2021)   [employer] of Occupational Health - Occupational Stress Questionnaire    Feeling of Stress : Only a little  Social Connections: Moderately Isolated (03/05/2021)   Social Connection and Isolation Panel [NHANES]    Frequency of Communication with Friends and Family: More than three times a week    Frequency of Social Gatherings with Friends and Family: More than three times a week    Attends Religious Services: Never    Database administrator or Organizations: Yes    Attends Banker Meetings: Never    Marital Status: Divorced     Family History: The patient's family history includes Arthritis in her father; Atrial fibrillation in her mother; Dementia in her maternal grandmother; Healthy in her brother, brother, brother, daughter, and son; Heart disease in her father and maternal grandmother;  Hypercholesterolemia in her maternal grandmother; Hypertension in her father and maternal grandmother; Leukemia in her father; Neuropathy in her mother. There is no history of Stroke, Cancer, or Breast cancer.  ROS:   Please see the history of present illness.     All other systems reviewed and are negative.  EKGs/Labs/Other Studies Reviewed:    The following studies were reviewed today:   EKG:  EKG is  ordered today.  The ekg ordered today demonstrates normal sinus rhythm, heart rate 70  Recent Labs: 07/10/2022: ALT 7; TSH 2.230 08/22/2022: BUN 7; Creatinine, Ser 0.69; Hemoglobin 13.8; Platelets 190; Potassium 3.7; Sodium 134  Recent Lipid Panel    Component Value Date/Time   CHOL 220 (H) 07/10/2022 1632   TRIG 107 07/10/2022 1632   HDL 56 07/10/2022 1632   CHOLHDL 3.9 07/10/2022 1632   LDLCALC 145 (H) 07/10/2022 1632     Risk Assessment/Calculations:             Physical Exam:    VS:  BP 132/82 (BP Location: Right Arm, Patient Position: Sitting, Cuff Size: Normal)   Pulse 70   Ht 5\' 1"  (1.549 m)   Wt 189 lb (85.7 kg)   SpO2 98%   BMI 35.71 kg/m     Wt Readings from Last 3 Encounters:  10/16/22 189 lb (85.7 kg)  10/06/22 185 lb (83.9 kg)  09/08/22 188 lb (85.3 kg)     GEN:  Well nourished, well developed in no acute distress HEENT: Normal NECK: No JVD; No carotid bruits CARDIAC: RRR, no murmurs, rubs, gallops RESPIRATORY:  Clear to auscultation without rales, wheezing or rhonchi  ABDOMEN: Soft, non-tender, non-distended MUSCULOSKELETAL:  No edema; No deformity  SKIN: Warm and dry NEUROLOGIC:  Alert and oriented x 3 PSYCHIATRIC:  Normal affect   ASSESSMENT:    1. Precordial pain   2. Smoking   3. Pure hypercholesterolemia   4. Chest pain, unspecified type    PLAN:    In order of problems listed above:  Chest pain, risk factors hyperlipidemia, former smoker.  Get echo, get coronary CTA. Current smoker, smoking cessation advised. Hyperlipidemia,  continue Lipitor 80.  Follow-up after cardiac testing      Medication Adjustments/Labs and Tests Ordered: Current medicines are reviewed at length with the patient today.  Concerns regarding medicines are outlined above.  Orders Placed This Encounter  Procedures   CT CORONARY MORPH W/CTA COR W/SCORE W/CA W/CM &/OR WO/CM   EKG 12-Lead   ECHOCARDIOGRAM COMPLETE   Meds ordered this encounter  Medications   metoprolol tartrate (LOPRESSOR) 100 MG tablet    Sig: Take 1 tablet (100 mg total) by mouth once for 1 dose. TWO HOURS PRIOR TO CARDIAC CTA    Dispense:  1 tablet    Refill:  0   ivabradine (CORLANOR) 5 MG TABS tablet    Sig: Take 2 tablets (10 mg total) by mouth once for 1 dose. TWO HOURS PRIOR TO CARDIAC CTA    Dispense:  2 tablet    Refill:  0    Patient Instructions  Medication Instructions:   Your physician recommends that you continue on your current medications as directed. Please refer to the Current Medication list given to you today.  *If you need a refill on your cardiac medications before your next appointment, please call your pharmacy*   Lab Work:  None Ordered  If you have labs (blood work) drawn today and your tests are completely normal, you will receive your results only by: MyChart Message (if you have MyChart) OR A paper copy in the mail If you have any lab test that is abnormal or we need to change your treatment, we will call you to review the results.   Testing/Procedures:  Your physician has requested that you have an echocardiogram. Echocardiography is a painless test that uses sound waves to create images of your heart. It provides your doctor with information about the size and shape of your heart and how well your heart's chambers and valves are working. This procedure takes approximately one hour. There are no restrictions for this procedure. Please do NOT wear cologne, perfume, aftershave, or lotions (deodorant is allowed). Please arrive 15  minutes prior to your appointment time.    Your cardiac CT will be scheduled at   [facility] [address] [place], [place] [address] [phone]  OR   [facility] [address] [place], [place] [address] [phone]  If scheduled at [facility] or [facility], please arrive 15 mins early for check-in and test prep.   Please follow these instructions carefully (unless otherwise directed):  On the Night Before the Test: Be sure to Drink plenty of water. Do not consume any caffeinated/decaffeinated beverages or chocolate 12 hours prior to your test. Do not take any antihistamines 12 hours prior to your test.  On the Day of the Test: Drink plenty of water until 1 hour prior to the test. Do not eat any food 1 hour prior to test. You may take your regular medications prior to the test.  Take metoprolol (Lopressor) two hours prior to test. Take Corlanor two hours prior to test FEMALES- please wear underwire-free bra if available, avoid dresses & tight clothing      After the Test: Drink plenty of water. After receiving IV contrast, you may experience a mild flushed feeling. This is normal. On occasion, you may experience a mild rash up to 24 hours after the test. This is not dangerous. If this occurs, you can take Benadryl 25 mg and increase your fluid  intake. If you experience trouble breathing, this can be serious. If it is severe call 911 IMMEDIATELY. If it is mild, please call our office.  We will call to schedule your test 2-4 weeks out understanding that some insurance companies will need an authorization prior to the service being performed.   For non-scheduling related questions, please contact the cardiac imaging nurse navigator should you have any questions/concerns: Rockwell Alexandria, Cardiac Imaging Nurse Navigator Larey Brick, Cardiac Imaging  Nurse Navigator [facility] Direct Office Dial: [phone]   For scheduling needs, including cancellations and rescheduling, please call [place], [phone].   Follow-Up: At [facility], you and your health needs are our priority.  As part of our continuing mission to provide you with exceptional heart care, we have created designated Provider Care Teams.  These Care Teams include your primary Cardiologist (physician) and Advanced Practice Providers (APPs -  Physician Assistants and Nurse Practitioners) who all work together to provide you with the care you need, when you need it.  We recommend signing up for the patient portal called "MyChart".  Sign up information is provided on this After Visit Summary.  MyChart is used to connect with patients for Virtual Visits (Telemedicine).  Patients are able to view lab/test results, encounter notes, upcoming appointments, etc.  Non-urgent messages can be sent to your provider as well.   To learn more about what you can do with MyChart, go to [email or web address].    Your next appointment:    After testing  Provider:   You may see Debbe Odea, MD or one of the following Advanced Practice Providers on your designated Care Team:   Nicolasa Ducking, NP Eula Listen, PA-C Cadence Fransico Michael, PA-C Charlsie Quest, NP   Signed, Debbe Odea, MD  10/16/2022 10:50 AM    [facility]

## 2022-10-17 ENCOUNTER — Ambulatory Visit: Payer: 59 | Admitting: Nurse Practitioner

## 2022-10-22 ENCOUNTER — Telehealth: Payer: Self-pay | Admitting: Student in an Organized Health Care Education/Training Program

## 2022-10-22 NOTE — Telephone Encounter (Signed)
Patient is scheduled May 20 for RFA, she wants to get full sedation with this procedure.

## 2022-10-24 ENCOUNTER — Ambulatory Visit: Admit: 2022-10-24 | Payer: 59 | Admitting: Gastroenterology

## 2022-10-24 SURGERY — COLONOSCOPY WITH PROPOFOL
Anesthesia: General

## 2022-10-27 ENCOUNTER — Ambulatory Visit
Admission: RE | Admit: 2022-10-27 | Discharge: 2022-10-27 | Disposition: A | Discharge status: Home / Self Care | Payer: 59 | Source: Ambulatory Visit | Attending: Student in an Organized Health Care Education/Training Program | Admitting: Student in an Organized Health Care Education/Training Program

## 2022-10-27 ENCOUNTER — Ambulatory Visit
Payer: 59 | Attending: Student in an Organized Health Care Education/Training Program | Admitting: Student in an Organized Health Care Education/Training Program

## 2022-10-27 ENCOUNTER — Encounter: Payer: Self-pay | Admitting: Student in an Organized Health Care Education/Training Program

## 2022-10-27 DIAGNOSIS — G894 Chronic pain syndrome: Secondary | ICD-10-CM

## 2022-10-27 DIAGNOSIS — M47816 Spondylosis without myelopathy or radiculopathy, lumbar region: Secondary | ICD-10-CM | POA: Diagnosis not present

## 2022-10-27 MED ORDER — FENTANYL CITRATE (PF) 100 MCG/2ML IJ SOLN
INTRAMUSCULAR | Status: AC
  Filled 2022-10-27: qty 2

## 2022-10-27 MED ORDER — LIDOCAINE HCL 2 % IJ SOLN
20.0000 mL | Freq: Once | INTRAMUSCULAR | Status: AC
  Administered 2022-10-27: 400 mg

## 2022-10-27 MED ORDER — LIDOCAINE HCL 2 % IJ SOLN
INTRAMUSCULAR | Status: AC
  Filled 2022-10-27: qty 20

## 2022-10-27 MED ORDER — MIDAZOLAM HCL 5 MG/5ML IJ SOLN
INTRAMUSCULAR | Status: AC
  Filled 2022-10-27: qty 5

## 2022-10-27 MED ORDER — DEXAMETHASONE SODIUM PHOSPHATE 10 MG/ML IJ SOLN
10.0000 mg | Freq: Once | INTRAMUSCULAR | Status: AC
  Administered 2022-10-27: 10 mg

## 2022-10-27 MED ORDER — ROPIVACAINE HCL 2 MG/ML IJ SOLN
9.0000 mL | Freq: Once | INTRAMUSCULAR | Status: AC
  Administered 2022-10-27: 9 mL via PERINEURAL

## 2022-10-27 MED ORDER — MIDAZOLAM HCL 5 MG/5ML IJ SOLN
0.5000 mg | Freq: Once | INTRAMUSCULAR | Status: AC
  Administered 2022-10-27: 2 mg via INTRAVENOUS

## 2022-10-27 MED ORDER — DEXAMETHASONE SODIUM PHOSPHATE 10 MG/ML IJ SOLN
INTRAMUSCULAR | Status: AC
  Filled 2022-10-27: qty 1

## 2022-10-27 MED ORDER — LACTATED RINGERS IV SOLN: Freq: Once | INTRAVENOUS | Status: AC

## 2022-10-27 MED ORDER — FENTANYL CITRATE (PF) 100 MCG/2ML IJ SOLN
25.0000 ug | INTRAMUSCULAR | Status: DC | PRN
  Administered 2022-10-27: 50 ug via INTRAVENOUS

## 2022-10-27 MED ORDER — ROPIVACAINE HCL 2 MG/ML IJ SOLN
INTRAMUSCULAR | Status: AC
  Filled 2022-10-27: qty 20

## 2022-10-27 NOTE — Progress Notes (Signed)
PROVIDER NOTE: Interpretation of information contained herein should be left to medically-trained personnel. Specific patient instructions are provided elsewhere under "Patient Instructions" section of medical record. This document was created in part using STT-dictation technology, any transcriptional errors that may result from this process are unintentional.  Patient: Janet Murray Type: Established DOB: 06-25-1970 MRN: [id number] PCP: Janet Grooms, NP  Service: Procedure DOS: 10/27/2022 Setting: Ambulatory Location: Ambulatory outpatient facility Delivery: Face-to-face Provider: Edward Jolly, MD Specialty: Interventional Pain Management Specialty designation: 09 Location: Outpatient facility Ref. Prov.: Janet Jolly, MD       Interventional Therapy   Procedure: Lumbar Facet, Medial Branch Radiofrequency Ablation (RFA) #1  Laterality: Right (-RT)  Level: L3, L4, and L5 Medial Branch Level(s). These levels will denervate the L3-4 and L4-5 lumbar facet joints.  Imaging: Fluoroscopy-guided         Anesthesia: Local anesthesia (1-2% Lidocaine) Sedation:   Moderate conscious sedation.         DOS: 10/27/2022  Performed by: Janet Jolly, MD  Purpose: Therapeutic/Palliative Indications: Low back pain severe enough to impact quality of life or function. Indications: 1. Lumbar facet arthropathy   2. Chronic pain syndrome   3. Lumbar spondylosis (right)    Ms. Janet Murray has been dealing with the above chronic pain for longer than three months and has either failed to respond, was unable to tolerate, or simply did not get enough benefit from other more conservative therapies including, but not limited to: 1. Over-the-counter medications 2. Anti-inflammatory medications 3. Muscle relaxants 4. Membrane stabilizers 5. Opioids 6. Physical therapy and/or chiropractic manipulation 7. Modalities (Heat, ice, etc.) 8. Invasive techniques such as nerve blocks. Ms. Janet Murray has attained more than  50% relief of the pain from a series of diagnostic injections conducted in separate occasions.  Pain Score: Pre-procedure: 7 /10 Post-procedure: 0-No pain/10     Position / Prep / Materials:  Position: Prone  Prep solution: DuraPrep (Iodine Povacrylex [0.7% available iodine] and Isopropyl Alcohol, 74% w/w) Prep Area: Entire Lumbosacral Region (Lower back from mid-thoracic region to end of tailbone and from flank to flank.) Materials:  Tray: RFA (Radiofrequency) tray Needle(s):  Type: RFA (Teflon-coated radiofrequency ablation needles) Gauge (G): 22  Length: Regular (10cm) Qty: 3      Pre-op H&P Assessment:  Ms. Janet Murray is a 52 y.o. (year old), female patient, seen today for interventional treatment. She  has a past surgical history that includes Bladder surgery; Cesarean section; Appendectomy; Total abdominal hysterectomy w/ bilateral salpingoophorectomy; Esophagogastroduodenoscopy (04/26/2019); Stomach surgery; Colonoscopy with propofol (N/A, 04/11/2020); Thyroidectomy (N/A, 04/12/2021); Colonoscopy with propofol (N/A, 10/22/2021); and Abdominal hysterectomy. Ms. Janet Murray has a current medication list which includes the following prescription(s): albuterol, albuterol, AMBULATORY NON FORMULARY MEDICATION, atorvastatin, diclofenac sodium, esomeprazole, bevespi aerosphere, hydroxyzine, levothyroxine, victoza, montelukast, naloxone, ondansetron, oxycodone, pramipexole, tizanidine, vitamin d (ergocalciferol), and metoprolol tartrate, and the following Facility-Administered Medications: fentanyl. Her primarily concern today is the Back Pain (Right, lower)  Initial Vital Signs:  Pulse/HCG Rate: 78ECG Heart Rate: 64 Temp: (!) 97.5 F (36.4 C) Resp: 18 BP: 123/75 SpO2: 99 %  BMI: Estimated body mass index is 34.96 kg/m as calculated from the following:   Height as of this encounter: 5\' 1"  (1.549 m).   Weight as of this encounter: 185 lb (83.9 kg).  Risk Assessment: Allergies: Reviewed. She  is allergic to penicillins, acetaminophen, ciprofloxacin, aspirin, hydrocodone, and tape.  Allergy Precautions: None required Coagulopathies: Reviewed. None identified.  Blood-thinner therapy: None at this time Active Infection(s): Reviewed. None identified.  Ms. Janet Murray is afebrile  Site Confirmation: Ms. Janet Murray was asked to confirm the procedure and laterality before marking the site Procedure checklist: Completed Consent: Before the procedure and under the influence of no sedative(s), amnesic(s), or anxiolytics, the patient was informed of the treatment options, risks and possible complications. To fulfill our ethical and legal obligations, as recommended by the American Medical Association's Code of Ethics, I have informed the patient of my clinical impression; the nature and purpose of the treatment or procedure; the risks, benefits, and possible complications of the intervention; the alternatives, including doing nothing; the risk(s) and benefit(s) of the alternative treatment(s) or procedure(s); and the risk(s) and benefit(s) of doing nothing. The patient was provided information about the general risks and possible complications associated with the procedure. These may include, but are not limited to: failure to achieve desired goals, infection, bleeding, organ or nerve damage, allergic reactions, paralysis, and death. In addition, the patient was informed of those risks and complications associated to Spine-related procedures, such as failure to decrease pain; infection (i.e.: Meningitis, epidural or intraspinal abscess); bleeding (i.e.: epidural hematoma, subarachnoid hemorrhage, or any other type of intraspinal or peri-dural bleeding); organ or nerve damage (i.e.: Any type of peripheral nerve, nerve root, or spinal cord injury) with subsequent damage to sensory, motor, and/or autonomic systems, resulting in permanent pain, numbness, and/or weakness of one or several areas of the body; allergic  reactions; (i.e.: anaphylactic reaction); and/or death. Furthermore, the patient was informed of those risks and complications associated with the medications. These include, but are not limited to: allergic reactions (i.e.: anaphylactic or anaphylactoid reaction(s)); adrenal axis suppression; blood sugar elevation that in diabetics may result in ketoacidosis or comma; water retention that in patients with history of congestive heart failure may result in shortness of breath, pulmonary edema, and decompensation with resultant heart failure; weight gain; swelling or edema; medication-induced neural toxicity; particulate matter embolism and blood vessel occlusion with resultant organ, and/or nervous system infarction; and/or aseptic necrosis of one or more joints. Finally, the patient was informed that Medicine is not an exact science; therefore, there is also the possibility of unforeseen or unpredictable risks and/or possible complications that may result in a catastrophic outcome. The patient indicated having understood very clearly. We have given the patient no guarantees and we have made no promises. Enough time was given to the patient to ask questions, all of which were answered to the patient's satisfaction. Ms. Updegrove has indicated that she wanted to continue with the procedure. Attestation: I, the ordering provider, attest that I have discussed with the patient the benefits, risks, side-effects, alternatives, likelihood of achieving goals, and potential problems during recovery for the procedure that I have provided informed consent. Date  Time: 10/27/2022  8:47 AM   Pre-Procedure Preparation:  Monitoring: As per clinic protocol. Respiration, ETCO2, SpO2, BP, heart rate and rhythm monitor placed and checked for adequate function Safety Precautions: Patient was assessed for positional comfort and pressure points before starting the procedure. Time-out: I initiated and conducted the "Time-out" before  starting the procedure, as per protocol. The patient was asked to participate by confirming the accuracy of the "Time Out" information. Verification of the correct person, site, and procedure were performed and confirmed by me, the nursing staff, and the patient. "Time-out" conducted as per Joint Commission's Universal Protocol (UP.01.01.01). Time: 0945 Start Time: 0945 hrs.  Description of Procedure:          Laterality: See above. Levels:  See above. Safety Precautions:  Aspiration looking for blood return was conducted prior to all injections. At no point did we inject any substances, as a needle was being advanced. Before injecting, the patient was told to immediately notify me if she was experiencing any new onset of "ringing in the ears, or metallic taste in the mouth". No attempts were made at seeking any paresthesias. Safe injection practices and needle disposal techniques used. Medications properly checked for expiration dates. SDV (single dose vial) medications used. After the completion of the procedure, all disposable equipment used was discarded in the proper designated medical waste containers. Local Anesthesia: Protocol guidelines were followed. The patient was positioned over the fluoroscopy table. The area was prepped in the usual manner. The time-out was completed. The target area was identified using fluoroscopy. A 12-in long, straight, sterile hemostat was used with fluoroscopic guidance to locate the targets for each level blocked. Once located, the skin was marked with an approved surgical skin marker. Once all sites were marked, the skin (epidermis, dermis, and hypodermis), as well as deeper tissues (fat, connective tissue and muscle) were infiltrated with a small amount of a short-acting local anesthetic, loaded on a 10cc syringe with a 25G, 1.5-in  Needle. An appropriate amount of time was allowed for local anesthetics to take effect before proceeding to the next step. Technical  description of process:  Radiofrequency Ablation (RFA)  L3 Medial Branch Nerve RFA: The target area for the L3 medial branch is at the junction of the postero-lateral aspect of the superior articular process and the superior, posterior, and medial edge of the transverse process of L4. Under fluoroscopic guidance, a Radiofrequency needle was inserted until contact was made with os over the superior postero-lateral aspect of the pedicular shadow (target area). Sensory and motor testing was conducted to properly adjust the position of the needle. Once satisfactory placement of the needle was achieved, the numbing solution was slowly injected after negative aspiration for blood. 2.0 mL of the nerve block solution was injected without difficulty or complication. After waiting for at least 3 minutes, the ablation was performed. Once completed, the needle was removed intact. L4 Medial Branch Nerve RFA: The target area for the L4 medial branch is at the junction of the postero-lateral aspect of the superior articular process and the superior, posterior, and medial edge of the transverse process of L5. Under fluoroscopic guidance, a Radiofrequency needle was inserted until contact was made with os over the superior postero-lateral aspect of the pedicular shadow (target area). Sensory and motor testing was conducted to properly adjust the position of the needle. Once satisfactory placement of the needle was achieved, the numbing solution was slowly injected after negative aspiration for blood. 2.0 mL of the nerve block solution was injected without difficulty or complication. After waiting for at least 3 minutes, the ablation was performed. Once completed, the needle was removed intact. L5 Medial Branch Nerve RFA: The target area for the L5 medial branch is at the junction of the postero-lateral aspect of the superior articular process of S1 and the superior, posterior, and medial edge of the sacral ala. Under  fluoroscopic guidance, a Radiofrequency needle was inserted until contact was made with os over the superior postero-lateral aspect of the pedicular shadow (target area). Sensory and motor testing was conducted to properly adjust the position of the needle. Once satisfactory placement of the needle was achieved, the numbing solution was slowly injected after negative aspiration for blood. 2.0 mL of the nerve block solution was injected without  difficulty or complication. After waiting for at least 3 minutes, the ablation was performed. Once completed, the needle was removed intact.  Radiofrequency lesioning (ablation):  Radiofrequency Generator: Medtronic AccurianTM AG 1000 RF Generator Sensory Stimulation Parameters: 50 Hz was used to locate & identify the nerve, making sure that the needle was positioned such that there was no sensory stimulation below 0.3 V or above 0.7 V. Motor Stimulation Parameters: 2 Hz was used to evaluate the motor component. Care was taken not to lesion any nerves that demonstrated motor stimulation of the lower extremities at an output of less than 2.5 times that of the sensory threshold, or a maximum of 2.0 V. Lesioning Technique Parameters: Standard Radiofrequency settings. (Not bipolar or pulsed.) Temperature Settings: 80 degrees C Lesioning time: 60 seconds Intra-operative Compliance: Compliant  Once the entire procedure was completed, the treated area was cleaned, making sure to leave some of the prepping solution back to take advantage of its long term bactericidal properties.    Illustration of the posterior view of the lumbar spine and the posterior neural structures. Laminae of L2 through S1 are labeled. DPRL5, dorsal primary ramus of L5; DPRS1, dorsal primary ramus of S1; DPR3, dorsal primary ramus of L3; FJ, facet (zygapophyseal) joint L3-L4; I, inferior articular process of L4; LB1, lateral branch of dorsal primary ramus of L1; IAB, inferior articular branches  from L3 medial branch (supplies L4-L5 facet joint); IBP, intermediate branch plexus; MB3, medial branch of dorsal primary ramus of L3; NR3, third lumbar nerve root; S, superior articular process of L5; SAB, superior articular branches from L4 (supplies L4-5 facet joint also); TP3, transverse process of L3.  Facet Joint Innervation (* possible contribution)  L1-2 T12, L1 (L2*)  Medial Branch  L2-3 L1, L2 (L3*)         "          "  L3-4 L2, L3 (L4*)         "          "  L4-5 L3, L4 (L5*)         "          "  L5-S1 L4, L5, S1          "          "    Vitals:   10/27/22 1000 10/27/22 1009 10/27/22 1020 10/27/22 1030  BP: 118/69 (!) 105/58 (!) 107/52 (!) 98/51  Pulse: 63     Resp: 16 16 16 16   Temp:  (!) 97.3 F (36.3 C)    TempSrc:      SpO2: 100% 96% 96% 97%  Weight:      Height:        Start Time: 0945 hrs. End Time: 1001 hrs.  Imaging Guidance (Spinal):          Type of Imaging Technique: Fluoroscopy Guidance (Spinal) Indication(s): Assistance in needle guidance and placement for procedures requiring needle placement in or near specific anatomical locations not easily accessible without such assistance. Exposure Time: Please see nurses notes. Contrast: None used. Fluoroscopic Guidance: I was personally present during the use of fluoroscopy. "Tunnel Vision Technique" used to obtain the best possible view of the target area. Parallax error corrected before commencing the procedure. "Direction-depth-direction" technique used to introduce the needle under continuous pulsed fluoroscopy. Once target was reached, antero-posterior, oblique, and lateral fluoroscopic projection used confirm needle placement in all planes. Images permanently stored in EMR. Interpretation: No contrast injected. I personally interpreted  the imaging intraoperatively. Adequate needle placement confirmed in multiple planes. Permanent images saved into the patient's record.  Antibiotic Prophylaxis:    Anti-infectives (From admission, onward)    None      Indication(s): None identified  Post-operative Assessment:  Post-procedure Vital Signs:  Pulse/HCG Rate: 6360 Temp: (!) 97.3 F (36.3 C) Resp: 16 BP: (!) 98/51 SpO2: 97 %  EBL: None  Complications: No immediate post-treatment complications observed by team, or reported by patient.  Note: The patient tolerated the entire procedure well. A repeat set of vitals were taken after the procedure and the patient was kept under observation following institutional policy, for this type of procedure. Post-procedural neurological assessment was performed, showing return to baseline, prior to discharge. The patient was provided with post-procedure discharge instructions, including a section on how to identify potential problems. Should any problems arise concerning this procedure, the patient was given instructions to immediately contact us, at any time, without hesitation. In any case, we plan to contact the patient by telephone for a follow-up status report regarding this interventional procedure.  Comments:  No additional relevant information.  Plan of Care (POC)  Orders:  Orders Placed This Encounter  Procedures   DG PAIN CLINIC C-ARM 1-60 MIN NO REPORT    Intraoperative interpretation by procedural physician at [facility].    Standing Status:   Standing    Number of Occurrences:   1    Order Specific Question:   Reason for exam:    Answer:   Assistance in needle guidance and placement for procedures requiring needle placement in or near specific anatomical locations not easily accessible without such assistance.    Medications ordered for procedure: Meds ordered this encounter  Medications   lidocaine (XYLOCAINE) 2 % (with pres) injection 400 mg   lactated ringers infusion   midazolam (VERSED) 5 MG/5ML injection 0.5-2 mg    Make sure Flumazenil is available in the pyxis when using this medication. If oversedation  occurs, administer 0.2 mg IV over 15 sec. If after 45 sec no response, administer 0.2 mg again over 1 min; may repeat at 1 min intervals; not to exceed 4 doses (1 mg)   fentaNYL (SUBLIMAZE) injection 25-50 mcg    Make sure Narcan is available in the pyxis when using this medication. In the event of respiratory depression (RR< 8/min): Titrate NARCAN (naloxone) in increments of 0.1 to 0.2 mg IV at 2-3 minute intervals, until desired degree of reversal.   dexamethasone (DECADRON) injection 10 mg   ropivacaine (PF) 2 mg/mL (0.2%) (NAROPIN) injection 9 mL   Medications administered: We administered lidocaine, lactated ringers, midazolam, fentaNYL, dexamethasone, and ropivacaine (PF) 2 mg/mL (0.2%).  See the medical record for exact dosing, route, and time of administration.  Follow-up plan:   Return in about 6 weeks (around 12/08/2022) for Post Procedure Evaluation, in person.       Right L4/5 ESI 04/23/22: 20-25% pain relief for less than a week; right L3, L4, L5 medial branch nerve block 07/09/2022, 09/08/22, Right L3,4,5 RFA 10/27/22  Right SI-J/piriformis, right hip bursa injection       Recent Visits Date Type Provider Dept  10/06/22 Office Visit Janet Jolly, MD [facility]  09/08/22 Procedure visit Janet Jolly, MD [facility]  08/06/22 Office Visit Janet Jolly, MD [facility]  Showing recent visits within past 90 days and meeting all other requirements Today's Visits Date Type Provider Dept  10/27/22 Procedure visit Janet Jolly, MD [facility]  Showing today's visits and meeting all other requirements Future Appointments Date Type Provider Dept  12/08/22 Appointment Janet Jolly, MD [facility]  Showing future appointments within next 90 days and meeting all other requirements  Disposition: Discharge home  Discharge (Date  Time): 10/27/2022; 1031 hrs.   Primary Care Physician: Janet Grooms, NP Location: [facility] Note by: Janet Jolly, MD (TTS technology used. I apologize for any typographical errors that were not detected and corrected.) Date: 10/27/2022; Time: 10:53 AM  Disclaimer:  Medicine is not an Visual merchandiser. The only guarantee in medicine is that nothing is guaranteed. It is important to note that the decision to proceed with this intervention was based on the information collected from the patient. The Data and conclusions were drawn from the patient's questionnaire, the interview, and the physical examination. Because the information was provided in large part by the patient, it cannot be guaranteed that it has not been purposely or unconsciously manipulated. Every effort has been made to obtain as much relevant data as possible for this evaluation. It is important to note that the conclusions that lead to this procedure are derived in large part from the available data. Always take into account that the treatment will also be dependent on availability of resources and existing treatment guidelines, considered by other Pain Management Practitioners as being common knowledge and practice, at the time of the intervention. For Medico-Legal purposes, it is also important to point out that variation in procedural techniques and pharmacological choices are the acceptable norm. The indications, contraindications, technique, and results of the above procedure should only be interpreted and judged by a Board-Certified Interventional Pain Specialist with extensive familiarity and expertise in the same exact procedure and technique.

## 2022-10-27 NOTE — Patient Instructions (Signed)

## 2022-10-28 ENCOUNTER — Telehealth: Payer: Self-pay

## 2022-10-28 NOTE — Telephone Encounter (Signed)
Post procedure follow up.  Patient states she is doing well.   ?

## 2022-11-05 DIAGNOSIS — G9050 Complex regional pain syndrome I, unspecified: Secondary | ICD-10-CM | POA: Diagnosis not present

## 2022-11-10 ENCOUNTER — Encounter: Payer: Self-pay | Admitting: Nurse Practitioner

## 2022-11-11 ENCOUNTER — Ambulatory Visit (INDEPENDENT_AMBULATORY_CARE_PROVIDER_SITE_OTHER): Payer: 59 | Admitting: Nurse Practitioner

## 2022-11-11 ENCOUNTER — Encounter: Payer: Self-pay | Admitting: Nurse Practitioner

## 2022-11-11 VITALS — BP 130/80 | HR 70 | Temp 98.4°F | Ht 61.0 in | Wt 188.8 lb

## 2022-11-11 DIAGNOSIS — F17210 Nicotine dependence, cigarettes, uncomplicated: Secondary | ICD-10-CM | POA: Diagnosis not present

## 2022-11-11 DIAGNOSIS — Z720 Tobacco use: Secondary | ICD-10-CM

## 2022-11-11 DIAGNOSIS — J439 Emphysema, unspecified: Secondary | ICD-10-CM | POA: Diagnosis not present

## 2022-11-11 NOTE — Assessment & Plan Note (Signed)
Compensated on current regimen. Improvement in DOE and chronic cough with LABA/LAMA therapy. She does have reversibility on PFTs but most recent eosinophils were nl so would hold off on addition of ICS for now. Action plan in place. Encouraged to remain active.  Patient Instructions  Continue Albuterol inhaler 2 puffs or 3 mL neb every 6 hours as needed for shortness of breath or wheezing. Notify if symptoms persist despite rescue inhaler/neb use.  Continue Bevespi 2 puffs twice daily  Continue singulair 1 tab daily   Continue to work on quitting smoking  Referred to the lung cancer screening program  Your pulmonary function testing showed moderately severe obstructive lung disease with an improvement in lung function after receiving albuterol   Follow up in 4 months with Dr. Jayme Cloud (1st) or [facility]. If symptoms worsen, please contact office for sooner follow up or seek emergency care.

## 2022-11-11 NOTE — Patient Instructions (Addendum)
Continue Albuterol inhaler 2 puffs or 3 mL neb every 6 hours as needed for shortness of breath or wheezing. Notify if symptoms persist despite rescue inhaler/neb use.  Continue Bevespi 2 puffs twice daily  Continue singulair 1 tab daily   Continue to work on quitting smoking  Referred to the lung cancer screening program  Your pulmonary function testing showed moderately severe obstructive lung disease with an improvement in lung function after receiving albuterol   Follow up in 4 months with Dr. Jayme Cloud (1st) or [facility]. If symptoms worsen, please contact office for sooner follow up or seek emergency care.

## 2022-11-11 NOTE — Assessment & Plan Note (Addendum)
Thyroid cancer, thyroidectomy 04/2022. GI cancer 5 years ago reported by patient. She has been seen by oncology. PET scan without any evidence of active disease. No recommended treatment. She will have labs in 6 months for monitoring but is not under any routine imaging surveillance.   Per our NP with lung cancer screening program, since she does not have any active disease and is not undergoing routine chest imaging, she will be a candidate for lung cancer screening program. Referral placed.   The patient's current tobacco use: 0.5 ppd The patient was advised to quit and impact of smoking on their health.  I assessed the patient's willingness to attempt to quit. I provided methods and skills for cessation. We reviewed medication management of smoking session drugs if appropriate. Resources to help quit smoking were provided. A smoking cessation quit date was set: 12/11/2022 Follow-up was arranged in our clinic.  The amount of time spent counseling patient was 3 mins

## 2022-11-11 NOTE — Progress Notes (Signed)
@Patient  ID: Janet Murray, female    DOB: 10-30-1970, 52 y.o.   MRN: [id number]  Chief Complaint  Patient presents with   Follow-up    SOB in the mornings. Cough with clear sputum. No wheezing.     Referring provider: Larae Grooms, NP  HPI: 52 year old female, active smoker followed for COPD with chronic bronchitis and emphysema. She is a patient of Dr. Jayme Cloud and last seen in office 08/07/2022. Past medical history significant for CAD, migraines, fatty liver, thyroid cancer, RLS, anxiety.   TEST/EVENTS:  10/29/2017 PFTs: FVC 41%, FEV1 27%, ratio 56%.  Significant bronchodilator response of 58%.  Diffusion capacity moderately reduced 03/14/2021 PFTs: FVC 58%, FEV1 50%, ratio 68%, diffusing capacity normal.  No BD. 10/02/2022 PFTs: FVC 55%, FEV1 48%, ratio 71%. Positive BD of 28%. TLC 122%, DLCO is normal.   08/07/2022: OV with Dr. Jayme Cloud. Intolerant of ICS in past. No change in character of cough or dyspnea. Occasional wheeze which responds to albuterol.   11/11/2022: Today - follow up Patient presents today for follow up. She had PFTs since she was here last that revealed moderate to severe obstruction with reversibility. Overall, these were stable compared to her previous testing. She was started on Bevespi at her last visit. She feels like this is working well for her. She feels like her stamina has improved and she's not noticing that her breathing is as short. She does still have a morning productive cough but tends to resolve after this. She denies any fevers, chills, night sweats, hemoptysis, anorexia, weight loss, leg swelling. Using albuterol once a day, which is an improvement. She's still smoking. Working on cutting back.   Allergies  Allergen Reactions   Penicillins Hives and Swelling    Has patient had a PCN reaction causing immediate rash, facial/tongue/throat swelling, SOB or lightheadedness with hypotension: Yes Has patient had a PCN reaction causing severe rash  involving mucus membranes or skin necrosis: No Has patient had a PCN reaction that required hospitalization No Has patient had a PCN reaction occurring within the last 10 years: No If all of the above answers are "NO", then may proceed with Cephalosporin use.   Acetaminophen     Due to polyps    Ciprofloxacin Itching   Aspirin Palpitations    Heart palpitations   Hydrocodone Hives and Rash   Tape Rash    Per patient "clear tape"    Immunization History  Administered Date(s) Administered   Td 07/10/2013    Past Medical History:  Diagnosis Date   Abdominal mass, RUQ (right upper quadrant)    Anxiety    Arthritis    Asthma    Bronchitis    Cancer (HCC) 04/12/2021   Carpal tunnel syndrome of left wrist 10/25/2014   Chronic bronchitis, obstructive 07/20/2017   Chronic pain    Colon cancer (HCC)    COPD (chronic obstructive pulmonary disease) (HCC)    Coronary artery disease    Cyst of right kidney 03/22/2018   DDD (degenerative disc disease), lumbar 10/25/2014   Degenerative joint disease (DJD) of hip    Diarrhea of infectious origin 03/25/2018   Dysphagia    Dyspnea    Emphysema of lung (HCC)    Facet syndrome, lumbar 10/25/2014   Fatty liver 03/25/2018   Foot fracture, left 05/02/2015   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   GERD (gastroesophageal reflux disease)    Hereditary and idiopathic peripheral neuropathy 11/06/2014   Hypothyroidism    IBS (irritable bowel  syndrome)    Low back pain    Migraine 02/04/2016   Occipital neuralgia    Osteoarthritis of spine with radiculopathy, lumbar region 10/25/2014   Other intervertebral disc degeneration, lumbar region    Other irritable bowel syndrome 04/20/2017   Other spondylosis with radiculopathy, lumbar region 10/25/2014   RLS (restless legs syndrome)    Sacroiliac joint dysfunction of both sides 10/25/2014   Spondylosis of lumbar spine    Stomach cancer (HCC)    Urinary incontinence     Tobacco History: Social  History   Tobacco Use  Smoking Status Every Day   Packs/day: 0.50   Years: 25.00   Additional pack years: 0.00   Total pack years: 12.50   Types: Cigarettes  Smokeless Tobacco Never  Tobacco Comments   0.5PPD 11/11/2022 khj   Ready to quit: Not Answered Counseling given: Not Answered Tobacco comments: 0.5PPD 11/11/2022 khj   Outpatient Medications Prior to Visit  Medication Sig Dispense Refill   albuterol (PROVENTIL) (2.5 MG/3ML) 0.083% nebulizer solution Take 3 mLs (2.5 mg total) by nebulization every 6 (six) hours as needed for wheezing or shortness of breath. 75 mL 12   albuterol (VENTOLIN HFA) 108 (90 Base) MCG/ACT inhaler Inhale 2 puffs into the lungs every 6 (six) hours as needed for wheezing or shortness of breath. 8.5 g 4   AMBULATORY NON FORMULARY MEDICATION Medication Name: nebulizer DX: J44.9 1 each 0   atorvastatin (LIPITOR) 80 MG tablet TAKE 1 TABLET BY MOUTH DAILY 90 tablet 3   diclofenac Sodium (VOLTAREN) 1 % GEL Apply 1 application topically daily as needed (Knee pain).     esomeprazole (NEXIUM) 40 MG capsule TAKE ONE CAPSULE BY MOUTH TWICE A DAY 180 capsule 0   Glycopyrrolate-Formoterol (BEVESPI AEROSPHERE) 9-4.8 MCG/ACT AERO 2 puffs twice a day 10.7 g 11   hydrOXYzine (ATARAX/VISTARIL) 25 MG tablet Take 1 tablet (25 mg total) by mouth 3 (three) times daily as needed. (Patient taking differently: Take 25 mg by mouth 3 (three) times daily as needed for anxiety.) 30 tablet 5   levothyroxine (SYNTHROID) 175 MCG tablet Take 1 tablet (175 mcg total) by mouth daily. 90 tablet 2   liraglutide (VICTOZA) 18 MG/3ML SOPN INJECT 1.2 MG INTO THE SKIN DAILY 6 mL 1   naloxone (NARCAN) nasal spray 4 mg/0.1 mL SMARTSIG:Both Nares     ondansetron (ZOFRAN-ODT) 4 MG disintegrating tablet TAKE 1 TABLET BY MOUTH EVERY 8 HOURS AS NEEDED 30 tablet 0   oxyCODONE (OXY IR/ROXICODONE) 5 MG immediate release tablet Take 1 tablet (5 mg total) by mouth every 4 (four) hours as needed for severe pain.  20 tablet 0   pramipexole (MIRAPEX) 0.5 MG tablet TAKE 1 TABLET BY MOUTH 3 TIMES DAILY 270 tablet 0   tiZANidine (ZANAFLEX) 2 MG tablet Take 2 mg by mouth 2 (two) times daily.     Vitamin D, Ergocalciferol, (DRISDOL) 1.25 MG (50000 UNIT) CAPS capsule Take 1 capsule (50,000 Units total) by mouth every 7 (seven) days. 12 capsule 1   montelukast (SINGULAIR) 10 MG tablet TAKE 1 TABLET BY MOUTH AT BEDTIME 90 tablet 2   metoprolol tartrate (LOPRESSOR) 100 MG tablet Take 1 tablet (100 mg total) by mouth once for 1 dose. TWO HOURS PRIOR TO CARDIAC CTA 1 tablet 0   No facility-administered medications prior to visit.     Review of Systems:   Constitutional: No weight loss or gain, night sweats, fevers, chills, fatigue, or lassitude. HEENT: No headaches, difficulty swallowing, tooth/dental problems,  or sore throat. No sneezing, itching, ear ache, nasal congestion, or post nasal drip CV:  No chest pain, orthopnea, PND, swelling in lower extremities, anasarca, dizziness, palpitations, syncope Resp: +shortness of breath with exertion; chronic cough; rare wheeze. No excess mucus or change in color of mucus. No chest wall deformity GI:  No heartburn, indigestion, abdominal pain, nausea, vomiting, diarrhea, change in bowel habits, loss of appetite, bloody stools.  GU: No dysuria, change in color of urine, urgency or frequency.   MSK:  No increased joint pain or swelling.   Neuro: No dizziness or lightheadedness.  Psych: No increased depression or anxiety. Mood stable.     Physical Exam:  BP 130/80 (BP Location: Left Arm, Cuff Size: Normal)   Pulse 70   Temp 98.4 F (36.9 C)   Ht 5\' 1"  (1.549 m)   Wt 188 lb 12.8 oz (85.6 kg)   SpO2 98%   BMI 35.67 kg/m   GEN: Pleasant, interactive, well-appearing; obese; in no acute distress HEENT:  Normocephalic and atraumatic. PERRLA. Sclera white. Nasal turbinates pink, moist and patent bilaterally. No rhinorrhea present. Oropharynx pink and moist, without  exudate or edema. No lesions, ulcerations, or postnasal drip.  NECK:  Supple w/ fair ROM. No JVD present. Normal carotid impulses w/o bruits. No lymphadenopathy.   CV: RRR, no m/r/g, no peripheral edema. Pulses intact, +2 bilaterally. No cyanosis, pallor or clubbing. PULMONARY:  Unlabored, regular breathing. Clear bilaterally A&P w/o wheezes/rales/rhonchi. No accessory muscle use.  GI: BS present and normoactive. Soft, non-tender to palpation. No organomegaly or masses detected.  MSK: No erythema, warmth or tenderness. Cap refil <2 sec all extrem. No deformities or joint swelling noted.  Neuro: A/Ox3. No focal deficits noted.   Skin: Warm, no lesions or rashe Psych: Normal affect and behavior. Judgement and thought content appropriate.     Lab Results:  CBC    Component Value Date/Time   WBC 6.2 08/22/2022 1752   RBC 4.14 08/22/2022 1752   HGB 13.8 08/22/2022 1752   HGB 13.5 07/10/2022 1632   HCT 40.9 08/22/2022 1752   HCT 38.4 07/10/2022 1632   PLT 190 08/22/2022 1752   PLT CANCELED 07/10/2022 1632   MCV 98.8 08/22/2022 1752   MCV 97 07/10/2022 1632   MCV 103 (H) 03/15/2014 1716   MCH 33.3 08/22/2022 1752   MCHC 33.7 08/22/2022 1752   RDW 12.8 08/22/2022 1752   RDW 11.6 (L) 07/10/2022 1632   RDW 13.0 03/15/2014 1716   LYMPHSABS 2.7 07/10/2022 1632   LYMPHSABS 2.9 10/29/2013 1812   MONOABS 0.8 11/30/2014 2322   MONOABS 0.7 10/29/2013 1812   EOSABS 0.0 07/10/2022 1632   EOSABS 0.2 10/29/2013 1812   BASOSABS 0.0 07/10/2022 1632   BASOSABS 0.1 10/29/2013 1812    BMET    Component Value Date/Time   NA 134 (L) 08/22/2022 1752   NA 140 07/10/2022 1632   NA 141 03/15/2014 1609   K 3.7 08/22/2022 1752   K 3.5 03/15/2014 1609   CL 102 08/22/2022 1752   CL 109 (H) 03/15/2014 1609   CO2 21 (L) 08/22/2022 1752   CO2 27 03/15/2014 1609   GLUCOSE 130 (H) 08/22/2022 1752   GLUCOSE 88 03/15/2014 1609   BUN 7 08/22/2022 1752   BUN 9 07/10/2022 1632   BUN 6 (L) 03/15/2014  1609   CREATININE 0.69 08/22/2022 1752   CREATININE 0.52 (L) 03/15/2014 1609   CALCIUM 9.0 08/22/2022 1752   CALCIUM 8.2 (L) 03/15/2014 1609  GFRNONAA >60 08/22/2022 1752   GFRNONAA >60 03/15/2014 1609   GFRNONAA >60 12/16/2013 2140   GFRAA 127 01/19/2020 0929   GFRAA >60 03/15/2014 1609   GFRAA >60 12/16/2013 2140    BNP    Component Value Date/Time   BNP 20 12/16/2013 2140     Imaging:  DG PAIN CLINIC C-ARM 1-60 MIN NO REPORT  Result Date: 10/27/2022 Fluoro was used, but no Radiologist interpretation will be provided. Please refer to "NOTES" tab for provider progress note.   albuterol (PROVENTIL) (2.5 MG/3ML) 0.083% nebulizer solution 2.5 mg     Date Action Dose Route User   10/02/2022 1614 Given 2.5 mg Nebulization Fleet Contras, RRT      dexamethasone (DECADRON) injection 10 mg     Date Action Dose Route User   10/27/2022 0910 Given 10 mg Other Newman Pies, RN      fentaNYL (SUBLIMAZE) injection 25-50 mcg     Date Action Dose Route User   10/27/[phone] Given 50 mcg Intravenous Newman Pies, RN      lactated ringers infusion     Date Action Dose Route User   10/27/2022 0920 New Bag/Given (none) Intravenous Newman Pies, RN      lidocaine (XYLOCAINE) 2 % (with pres) injection 400 mg     Date Action Dose Route User   10/27/2022 0909 Given 400 mg Infiltration Newman Pies, RN      midazolam (VERSED) 5 MG/5ML injection 0.5-2 mg     Date Action Dose Route User   10/27/2022 [phone] Given by Other 2 mg Intravenous Newman Pies, RN      ropivacaine (PF) 2 mg/mL (0.2%) (NAROPIN) injection 9 mL     Date Action Dose Route User   10/27/2022 0909 New Bag/Given 9 mL Peri-NEURAL Newman Pies, RN          Latest Ref Rng & Units 10/02/2022    4:34 PM  PFT Results  FVC-Pre L 1.76   FVC-Predicted Pre % 55   FVC-Post L 2.17   FVC-Predicted Post % 69   Pre FEV1/FVC % % 68   Post FEV1/FCV % % 71   FEV1-Pre L 1.20    FEV1-Predicted Pre % 48   FEV1-Post L 1.54   DLCO uncorrected ml/min/mmHg 18.05   DLCO UNC% % 95   DLVA Predicted % 96   TLC L 5.65   TLC % Predicted % 122   RV % Predicted % 192     Lab Results  Component Value Date   NITRICOXIDE 5 08/07/2022        Assessment & Plan:   COPD (chronic obstructive pulmonary disease) with emphysema (HCC) Compensated on current regimen. Improvement in DOE and chronic cough with LABA/LAMA therapy. She does have reversibility on PFTs but most recent eosinophils were nl so would hold off on addition of ICS for now. Action plan in place. Encouraged to remain active.  Patient Instructions  Continue Albuterol inhaler 2 puffs or 3 mL neb every 6 hours as needed for shortness of breath or wheezing. Notify if symptoms persist despite rescue inhaler/neb use.  Continue Bevespi 2 puffs twice daily  Continue singulair 1 tab daily   Continue to work on quitting smoking  Referred to the lung cancer screening program  Your pulmonary function testing showed moderately severe obstructive lung disease with an improvement in lung function after receiving albuterol   Follow up in 4 months with Dr. Jayme Cloud (1st) or [facility].  If symptoms worsen, please contact office for sooner follow up or seek emergency care.    Tobacco abuse Thyroid cancer, thyroidectomy 04/2022. GI cancer 5 years ago reported by patient. She has been seen by oncology. PET scan without any evidence of active disease. No recommended treatment. She will have labs in 6 months for monitoring but is not under any routine imaging surveillance.   Per our NP with lung cancer screening program, since she does not have any active disease and is not undergoing routine chest imaging, she will be a candidate for lung cancer screening program. Referral placed.   The patient's current tobacco use: 0.5 ppd The patient was advised to quit and impact of smoking on their health.  I assessed the patient's willingness  to attempt to quit. I provided methods and skills for cessation. We reviewed medication management of smoking session drugs if appropriate. Resources to help quit smoking were provided. A smoking cessation quit date was set: 12/11/2022 Follow-up was arranged in our clinic.  The amount of time spent counseling patient was 3 mins    I spent 35 minutes of dedicated to the care of this patient on the date of this encounter to include pre-visit review of records, face-to-face time with the patient discussing conditions above, post visit ordering of testing, clinical documentation with the electronic health record, making appropriate referrals as documented, and communicating necessary findings to members of the patients care team.  Noemi Chapel, NP 11/14/2022  Pt aware and understands NP's role.

## 2022-11-12 ENCOUNTER — Other Ambulatory Visit: Payer: Self-pay | Admitting: Nurse Practitioner

## 2022-11-12 NOTE — Telephone Encounter (Signed)
Requested Prescriptions  Pending Prescriptions Disp Refills   montelukast (SINGULAIR) 10 MG tablet [Pharmacy Med Name: MONTELUKAST SODIUM 10 MG TAB] 90 tablet 2    Sig: TAKE 1 TABLET BY MOUTH AT BEDTIME     Pulmonology:  Leukotriene Inhibitors Passed - 11/12/2022  9:54 AM      Passed - Valid encounter within last 12 months    Recent Outpatient Visits           2 months ago Chest pain, unspecified type   [place] [facility], Megan P, DO   4 months ago Encounter for annual wellness exam in Medicare patient   [place] [facility] Larae Grooms, NP   4 months ago Acute cystitis with hematuria   [place] [facility] Larae Grooms, NP   10 months ago Vitamin D deficiency   [place] [facility] Larae Grooms, NP   11 months ago Osteoarthritis of spine with radiculopathy, lumbar region   [facility] Larae Grooms, NP       Future Appointments             In 3 weeks Agbor-Et[place], Arlys John, MD [facility] [place]   In 1 month Mecum, Oswaldo Conroy, PA-C  [facility], [facility]   In 2 months Orlie Dakin, Tollie Pizza, MD [facility]

## 2022-11-14 ENCOUNTER — Encounter: Payer: Self-pay | Admitting: Nurse Practitioner

## 2022-11-19 NOTE — Progress Notes (Signed)
Agree with the details of the visit as noted by Katherine Cobb, NP. ° °C. Laura Sofia Vanmeter, MD °Advanced Bronchoscopy °P[facility] ° °

## 2022-11-26 ENCOUNTER — Ambulatory Visit: Payer: 59

## 2022-12-02 ENCOUNTER — Telehealth: Payer: Self-pay | Admitting: Cardiology

## 2022-12-02 ENCOUNTER — Other Ambulatory Visit: Payer: Self-pay

## 2022-12-02 ENCOUNTER — Ambulatory Visit: Payer: 59

## 2022-12-02 NOTE — Telephone Encounter (Signed)
Patient is currently not scheduled for a Cardiac CTA. Will not send in medication until scheduled.

## 2022-12-02 NOTE — Telephone Encounter (Signed)
*  STAT* If patient is at the pharmacy, call can be transferred to refill team.   1. Which medications need to be refilled? (please list name of each medication and dose if known) Corlanor 5 mg  2. Which pharmacy/location (including street and city if local pharmacy) is medication to be sent to? [facility] [address] [place]  3. Do they need a 30 day or 90 day supply?  #2- test was cancel and she had already taken  the Corlanor

## 2022-12-04 ENCOUNTER — Ambulatory Visit: Payer: 59 | Admitting: Cardiology

## 2022-12-08 ENCOUNTER — Ambulatory Visit: Payer: 59 | Admitting: Student in an Organized Health Care Education/Training Program

## 2022-12-08 DIAGNOSIS — Z5181 Encounter for therapeutic drug level monitoring: Secondary | ICD-10-CM | POA: Diagnosis not present

## 2022-12-08 DIAGNOSIS — G9050 Complex regional pain syndrome I, unspecified: Secondary | ICD-10-CM | POA: Diagnosis not present

## 2022-12-08 DIAGNOSIS — Z79891 Long term (current) use of opiate analgesic: Secondary | ICD-10-CM | POA: Diagnosis not present

## 2022-12-12 ENCOUNTER — Telehealth: Payer: Self-pay | Admitting: Cardiology

## 2022-12-12 NOTE — Telephone Encounter (Signed)
Called patient, advised that the Metoprolol was needed for the CCTA (when rescheduled). This was not needed for the ECHO. Patient verbalized understanding of upcoming appointment for the ECHO and for the office visit. All questions answered.

## 2022-12-12 NOTE — Telephone Encounter (Signed)
Pt c/o medication issue:  1. Name of Medication:   metoprolol tartrate (LOPRESSOR) 100 MG tablet (Expired)   2. How are you currently taking this medication (dosage and times per day)?   3. Are you having a reaction (difficulty breathing--STAT)?   4. What is your medication issue?   Patient wants call back to clarify if and how she should take this medication.  Patient noted she has an Echocardiogram on 7/8.

## 2022-12-13 NOTE — Patient Instructions (Signed)
Sore Throat When you have a sore throat, your throat may feel: Tender. Burning. Irritated. Scratchy. Painful when you swallow. Painful when you talk. Many things can cause a sore throat, such as: An infection. Allergies. Dry air. Smoke or pollution. Radiation treatment for cancer. Gastroesophageal reflux disease (GERD). A tumor. A sore throat can be the first sign of another sickness. It can happen with other problems, like: Coughing. Sneezing. Fever. Swelling of the glands in the neck. Most sore throats go away without treatment. Follow these instructions at home:     Medicines Take over-the-counter and prescription medicines only as told by your doctor. Children often get sore throats. Do not give your child aspirin. Use throat sprays to soothe your throat as told by your health care provider. Managing pain To help with pain: Sip warm liquids, such as broth, herbal tea, or warm water. Eat or drink cold or frozen liquids, such as frozen ice pops. Rinse your mouth (gargle) with a salt water mixture 3-4 times a day or as needed. To make salt water, dissolve -1 tsp (3-6 g) of salt in 1 cup (237 mL) of warm water. Do not swallow this mixture. Suck on hard candy or throat lozenges. Put a cool-mist humidifier in your bedroom at night. Sit in the bathroom with the door closed for 5-10 minutes while you run hot water in the shower. General instructions Do not smoke or use any products that contain nicotine or tobacco. If you need help quitting, ask your doctor. Get plenty of rest. Drink enough fluid to keep your pee (urine) pale yellow. Wash your hands often for at least 20 seconds with soap and water. If soap and water are not available, use hand sanitizer. Contact a doctor if: You have a fever for more than 2-3 days. You keep having symptoms for more than 2-3 days. Your throat does not get better in 7 days. You have a fever and your symptoms suddenly get worse. Your  child who is 3 months to 3 years old has a temperature of 102.2F (39C) or higher. Get help right away if: You have trouble breathing. You cannot swallow fluids, soft foods, or your spit. You have swelling in your throat or neck that gets worse. You feel like you may vomit (nauseous) and this feeling lasts a long time. You cannot stop vomiting. These symptoms may be an emergency. Get help right away. Call your local emergency services (911 in the [place] Do not wait to see if the symptoms will go away. Do not drive yourself to the hospital. Summary A sore throat is a painful, burning, irritated, or scratchy throat. Many things can cause a sore throat. Take over-the-counter medicines only as told by your doctor. Get plenty of rest. Drink enough fluid to keep your pee (urine) pale yellow. Contact a doctor if your symptoms get worse or your sore throat does not get better within 7 days. This information is not intended to replace advice given to you by your health care provider. Make sure you discuss any questions you have with your health care provider. Document Revised: 08/22/2020 Document Reviewed: 08/22/2020 Elsevier Patient Education  2024 Elsevier Inc.  

## 2022-12-15 ENCOUNTER — Other Ambulatory Visit: Payer: Self-pay | Admitting: Nurse Practitioner

## 2022-12-15 ENCOUNTER — Ambulatory Visit: Payer: 59 | Attending: Cardiology

## 2022-12-15 DIAGNOSIS — K219 Gastro-esophageal reflux disease without esophagitis: Secondary | ICD-10-CM

## 2022-12-15 DIAGNOSIS — R072 Precordial pain: Secondary | ICD-10-CM | POA: Diagnosis not present

## 2022-12-15 LAB — ECHOCARDIOGRAM COMPLETE
AR max vel: 2.69 cm2
AV Area VTI: 2.96 cm2
AV Area mean vel: 2.61 cm2
AV Mean grad: 3 mmHg
AV Peak grad: 6.1 mmHg
Ao pk vel: 1.23 m/s
Area-P 1/2: 3.21 cm2
Calc EF: 56.8 %
S' Lateral: 3.85 cm
Single Plane A2C EF: 57.4 %
Single Plane A4C EF: 56.6 %

## 2022-12-16 ENCOUNTER — Ambulatory Visit (INDEPENDENT_AMBULATORY_CARE_PROVIDER_SITE_OTHER): Payer: 59 | Admitting: Nurse Practitioner

## 2022-12-16 VITALS — BP 110/73 | HR 65 | Temp 98.0°F | Ht 60.98 in | Wt 187.2 lb

## 2022-12-16 DIAGNOSIS — R350 Frequency of micturition: Secondary | ICD-10-CM | POA: Diagnosis not present

## 2022-12-16 DIAGNOSIS — R490 Dysphonia: Secondary | ICD-10-CM

## 2022-12-16 DIAGNOSIS — R8281 Pyuria: Secondary | ICD-10-CM | POA: Diagnosis not present

## 2022-12-16 DIAGNOSIS — J439 Emphysema, unspecified: Secondary | ICD-10-CM | POA: Diagnosis not present

## 2022-12-16 LAB — MICROSCOPIC EXAMINATION: Bacteria, UA: NONE SEEN

## 2022-12-16 LAB — URINALYSIS, ROUTINE W REFLEX MICROSCOPIC
Bilirubin, UA: NEGATIVE
Glucose, UA: NEGATIVE
Ketones, UA: NEGATIVE
Leukocytes,UA: NEGATIVE
Nitrite, UA: NEGATIVE
Specific Gravity, UA: 1.02 (ref 1.005–1.030)
Urobilinogen, Ur: 1 mg/dL (ref 0.2–1.0)
pH, UA: 7 (ref 5.0–7.5)

## 2022-12-16 LAB — WET PREP FOR TRICH, YEAST, CLUE
Clue Cell Exam: POSITIVE — AB
Trichomonas Exam: NEGATIVE
Yeast Exam: NEGATIVE

## 2022-12-16 MED ORDER — CLINDAMYCIN HCL 300 MG PO CAPS: 300.0000 mg | ORAL_CAPSULE | Freq: Two times a day (BID) | ORAL | 0 refills | Status: AC

## 2022-12-16 MED ORDER — TRIAMCINOLONE ACETONIDE 0.1 % EX CREA: 1.0000 | TOPICAL_CREAM | Freq: Two times a day (BID) | CUTANEOUS | 0 refills | Status: DC

## 2022-12-16 MED ORDER — FLUTICASONE PROPIONATE 50 MCG/ACT NA SUSP: 2.0000 | Freq: Every day | NASAL | 6 refills | Status: DC

## 2022-12-16 NOTE — Assessment & Plan Note (Signed)
Overall stable exam, but is a daily smoker.  Suspect related to allergic rhinitis and smoking, but will send a referral to ENT for further assessment as does have history of thyroid cancer.  Add on Flonase to allergic rhinitis regimen.

## 2022-12-16 NOTE — Telephone Encounter (Signed)
Requested Prescriptions  Pending Prescriptions Disp Refills   pramipexole (MIRAPEX) 0.5 MG tablet [Pharmacy Med Name: PRAMIPEXOLE DIHYDROCHLORIDE 0.5 MG] 270 tablet 0    Sig: TAKE 1 TABLET BY MOUTH 3 TIMES DAILY     Neurology:  Parkinsonian Agents Passed - 12/15/2022  3:14 PM      Passed - Last BP in normal range    BP Readings from Last 1 Encounters:  11/11/22 130/80         Passed - Last Heart Rate in normal range    Pulse Readings from Last 1 Encounters:  11/11/22 70         Passed - Valid encounter within last 12 months    Recent Outpatient Visits           3 months ago Chest pain, unspecified type   [place] [facility] [place], Megan P, DO   5 months ago Encounter for annual wellness exam in Medicare patient   [place] [facility] Larae Grooms, NP   5 months ago Acute cystitis with hematuria   [place] [facility] Larae Grooms, NP   11 months ago Vitamin D deficiency   [place] [facility] Larae Grooms, NP   1 year ago Osteoarthritis of spine with radiculopathy, lumbar region   [place] [facility] Larae Grooms, NP       Future Appointments             Today Marjie Skiff, NP [place] [facility]   In 1 week [place], Lavonna Rua, NP [place] [facility] [place]   In 2 weeks Mecum, Oswaldo Conroy, PA-C [place] [facility], [facility]   In 1 month Finnegan, Tollie Pizza, MD [place]             esomeprazole (NEXIUM) 40 MG capsule [Pharmacy Med Name: ESOMEPRAZOLE MAGNESIUM 40 MG CAP] 180 capsule 0    Sig: TAKE 1 CAPSULE BY MOUTH TWICE DAILY     Gastroenterology: Proton Pump Inhibitors 2 Passed - 12/15/2022  3:14 PM      Passed - ALT in normal range and within 360 days    ALT  Date Value Ref Range Status  07/10/2022 7 0 - 32 IU/L Final   SGPT (ALT)  Date Value Ref Range Status  10/29/2013 13 12 - 78  U/L Final         Passed - AST in normal range and within 360 days    AST  Date Value Ref Range Status  07/10/2022 10 0 - 40 IU/L Final   SGOT(AST)  Date Value Ref Range Status  10/29/2013 17 15 - 37 Unit/L Final         Passed - Valid encounter within last 12 months    Recent Outpatient Visits           3 months ago Chest pain, unspecified type   [place] [facility] [place], Megan P, DO   5 months ago Encounter for annual wellness exam in Medicare patient   [place] [facility] Larae Grooms, NP   5 months ago Acute cystitis with hematuria   [place] [facility] Larae Grooms, NP   11 months ago Vitamin D deficiency    [facility] Larae Grooms, NP   1 year ago Osteoarthritis of spine with radiculopathy, lumbar region   [facility] Larae Grooms, NP  Future Appointments             Today Marjie Skiff, NP [place] [facility], [facility]   In 1 week Charlsie Quest, NP [place] [facility] [place]   In 2 weeks Mecum, Oswaldo Conroy, PA-C [place] [facility], [facility]   In 1 month Finnegan, Tollie Pizza, MD [facility]

## 2022-12-16 NOTE — Assessment & Plan Note (Signed)
Chronic, ongoing.  Is a daily smoker, recommend cessation.  Continue current inhaler regimen.  Referral for lung cancer screening placed, discussed with patient.

## 2022-12-16 NOTE — Assessment & Plan Note (Signed)
Acute, UA overall reassuring will send to further assess.  Wet prep + clue cells.  Patient is familiar with bacterial vaginosis.  Has tolerated Clindamycin best in past.  Will send this in BID for 7 days and to return if worsening symptoms.  Referral to GYN to check on sling.

## 2022-12-16 NOTE — Progress Notes (Signed)
BP 110/73   Pulse 65   Temp 98 F (36.7 C) (Oral)   Ht 5' 0.98" (1.549 m)   Wt 187 lb 3.2 oz (84.9 kg)   SpO2 95%   BMI 35.39 kg/m    Subjective:    Patient ID: Conception Chancy, female    DOB: 05/23/1971, 52 y.o.   MRN: [id number]  HPI: Janet Murray is a 52 y.o. female  Chief Complaint  Patient presents with   Sore Throat    For the past 2 weeks   Urinary Frequency    Started a few days ago   SORE THROAT Reports ongoing hoarseness for a few weeks.  Had thyroid removed two years ago, 04/12/2021.  She followed for one visit with Dr. Orlie Dakin at cancer center on 07/17/22.  Currently using Bevespi for COPD + Albuterol as needed and Singulair daily.  This time of year she has issues with allergies at baseline. She is a current every day smoker, 1 pack lasts 3 days.  Has smoked since she was 89 or 52 years old.  Never a pack per day smoker.  Has been coughing more recently. Fever: no Cough: yes at baseline with COPD Shortness of breath: sometimes at baseline Wheezing: no Chest pain: no Chest tightness: no Chest congestion: no Nasal congestion: no Runny nose: no Post nasal drip: no Sneezing: no Sore throat: no Swollen glands: no Sinus pressure: no Headache: no Face pain: no Toothache: no Ear pain: none Ear pressure: none Eyes red/itching:no Eye drainage/crusting: no  Vomiting: no Rash: no Fatigue:  sometimes with the heat recently Context: stable Treatments attempted: breathing treatments    URINARY SYMPTOMS Started a few days ago.  Has sling holding bladder in place -- having more incontinence issues recently.   Dysuria: no Urinary frequency: yes Urgency: yes Small volume voids: no Symptom severity: yes Urinary incontinence: yes Foul odor: no Hematuria: no Abdominal pain: no Back pain: no Suprapubic pain/pressure: no Flank pain: no Fever:  no Vomiting: no Status: stable Previous urinary tract infection: yes Recurrent urinary tract infection: no Sexual  activity: No sexually active History of sexually transmitted disease: no Treatments attempted: cranberry and increasing fluids    Relevant past medical, surgical, family and social history reviewed and updated as indicated. Interim medical history since our last visit reviewed. Allergies and medications reviewed and updated.  Review of Systems  Constitutional:  Negative for activity change, appetite change, chills, fatigue and fever.  HENT:  Positive for voice change. Negative for postnasal drip, rhinorrhea and sore throat.   Respiratory:  Positive for cough (baseline) and shortness of breath (baseline). Negative for chest tightness.   Cardiovascular:  Negative for chest pain, palpitations and leg swelling.  Genitourinary:  Positive for frequency and urgency. Negative for decreased urine volume, dysuria, hematuria and vaginal discharge.  Neurological: Negative.   Psychiatric/Behavioral: Negative.     Per HPI unless specifically indicated above     Objective:    BP 110/73   Pulse 65   Temp 98 F (36.7 C) (Oral)   Ht 5' 0.98" (1.549 m)   Wt 187 lb 3.2 oz (84.9 kg)   SpO2 95%   BMI 35.39 kg/m   Wt Readings from Last 3 Encounters:  12/16/22 187 lb 3.2 oz (84.9 kg)  11/11/22 188 lb 12.8 oz (85.6 kg)  10/27/22 185 lb (83.9 kg)    Physical Exam Vitals and nursing note reviewed.  Constitutional:      General: She is awake. She  is not in acute distress.    Appearance: She is well-developed and well-groomed. She is obese. She is not ill-appearing or toxic-appearing.  HENT:     Head: Normocephalic.     Right Ear: Hearing, tympanic membrane, ear canal and external ear normal.     Left Ear: Hearing, tympanic membrane, ear canal and external ear normal.     Nose: No rhinorrhea.     Right Sinus: No maxillary sinus tenderness or frontal sinus tenderness.     Left Sinus: No maxillary sinus tenderness or frontal sinus tenderness.     Mouth/Throat:     Mouth: Mucous membranes are moist.      Tongue: No lesions.     Palate: No lesions.     Pharynx: Posterior oropharyngeal erythema (mild cobblestone pattern) present. No pharyngeal swelling or oropharyngeal exudate.     Tonsils: No tonsillar exudate. 1+ on the right. 1+ on the left.     Comments: Hoarseness present Eyes:     General: Lids are normal.        Right eye: No discharge.        Left eye: No discharge.     Conjunctiva/sclera: Conjunctivae normal.     Pupils: Pupils are equal, round, and reactive to light.  Neck:     Thyroid: No thyromegaly.     Vascular: No carotid bruit.  Cardiovascular:     Rate and Rhythm: Normal rate and regular rhythm.     Heart sounds: Normal heart sounds. No murmur heard.    No gallop.  Pulmonary:     Effort: Pulmonary effort is normal. No accessory muscle usage or respiratory distress.     Breath sounds: Normal breath sounds. No decreased breath sounds, wheezing or rhonchi.  Abdominal:     General: Bowel sounds are normal. There is no distension.     Palpations: Abdomen is soft.     Tenderness: There is no abdominal tenderness.  Musculoskeletal:     Cervical back: Normal range of motion and neck supple.     Right lower leg: No edema.     Left lower leg: No edema.  Lymphadenopathy:     Cervical: No cervical adenopathy.  Skin:    General: Skin is warm and dry.  Neurological:     Mental Status: She is alert and oriented to person, place, and time.     Deep Tendon Reflexes: Reflexes are normal and symmetric.     Reflex Scores:      Brachioradialis reflexes are 2+ on the right side and 2+ on the left side.      Patellar reflexes are 2+ on the right side and 2+ on the left side. Psychiatric:        Attention and Perception: Attention normal.        Mood and Affect: Mood normal.        Speech: Speech normal.        Behavior: Behavior normal. Behavior is cooperative.        Thought Content: Thought content normal.    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  Is a daily smoker, recommend cessation.  Continue current inhaler regimen.  Referral for lung cancer screening placed, discussed with patient.      Relevant Medications   fluticasone (FLONASE) 50 MCG/ACT nasal spray   Other Relevant Orders   Ambulatory Referral Lung Cancer Screening [facility]   Ambulatory referral to ENT     Other   Frequency of urination    Acute, UA overall reassuring will send to further assess.  Wet prep + clue cells.  Patient is familiar with bacterial vaginosis.  Has tolerated Clindamycin best in past.  Will send this in BID for 7 days and to return if worsening symptoms.  Referral to GYN to check on sling.      Relevant Orders   Urinalysis, Routine w reflex microscopic   Ambulatory referral to Gynecology   WET PREP FOR TRICH, YEAST, CLUE   Urine Culture   Hoarseness of voice    Overall stable exam, but is a daily smoker.  Suspect related to allergic rhinitis and smoking, but will send a referral to ENT for further assessment as does have history of thyroid cancer.  Add on Flonase to allergic rhinitis regimen.        Relevant Orders   Ambulatory referral to ENT   Other Visit Diagnoses     Pyuria       Urine sent for culture   Relevant Orders   Urine Culture        Follow up plan: Return for as schedule on the 29th.

## 2022-12-17 ENCOUNTER — Encounter: Payer: Self-pay | Admitting: Obstetrics

## 2022-12-17 ENCOUNTER — Ambulatory Visit: Payer: Self-pay

## 2022-12-17 MED ORDER — PREDNISONE 20 MG PO TABS: 40.0000 mg | ORAL_TABLET | Freq: Every day | ORAL | 0 refills | Status: AC

## 2022-12-17 NOTE — Telephone Encounter (Signed)
     Chief Complaint: Seen 12/16/22 with sore throat. Asking for Prednisone.  Symptoms: Above Frequency: 1 week ago Pertinent Negatives: Patient denies fever Disposition: [] ED /[] Urgent Care (no appt availability in office) / [] Appointment(In office/virtual)/ []  [facility]/ [x] Home Care/ [] Refused Recommended Disposition /[] [facility]/ [x]  Follow-up with PCP Additional Notes: Please advise pt.  Reason for Disposition  [1] Sore throat with cough/cold symptoms AND [2] present < 5 days  Answer Assessment - Initial Assessment Questions 1. ONSET: "When did the throat start hurting?" (Hours or days ago)      1 week ago 2. SEVERITY: "How bad is the sore throat?" (Scale 1-10; mild, moderate or severe)   - MILD (1-3):  Doesn't interfere with eating or normal activities.   - MODERATE (4-7): Interferes with eating some solids and normal activities.   - SEVERE (8-10):  Excruciating pain, interferes with most normal activities.   - SEVERE WITH DYSPHAGIA (10): Can't swallow liquids, drooling.     4 3. STREP EXPOSURE: "Has there been any exposure to strep within the past week?" If Yes, ask: "What type of contact occurred?"      No 4.  VIRAL SYMPTOMS: "Are there any symptoms of a cold, such as a runny nose, cough, hoarse voice or red eyes?"      No 5. FEVER: "Do you have a fever?" If Yes, ask: "What is your temperature, how was it measured, and when did it start?"     No 6. PUS ON THE TONSILS: "Is there pus on the tonsils in the back of your throat?"     No 7. OTHER SYMPTOMS: "Do you have any other symptoms?" (e.g., difficulty breathing, headache, rash)     No 8. PREGNANCY: "Is there any chance you are pregnant?" "When was your last menstrual period?"     No  Protocols used: Sore Throat-A-AH

## 2022-12-17 NOTE — Addendum Note (Signed)
Addended by: Aura Dials T on: 12/17/2022 01:08 PM   Modules accepted: Orders

## 2022-12-17 NOTE — Telephone Encounter (Signed)
Left message for patient to give call back to make her aware of Jolene's recommendations. Advised patient to give our office a call back.  OK for [facility] to get note if patient calls back.

## 2022-12-18 LAB — URINE CULTURE

## 2022-12-18 NOTE — Telephone Encounter (Signed)
Pt called back and message from J. Cannady DNP given.  Pt verbalized understanding.

## 2022-12-25 ENCOUNTER — Ambulatory Visit: Payer: 59 | Admitting: Cardiology

## 2022-12-25 NOTE — Progress Notes (Deleted)
  Cardiology Office Note:  .   Date:  12/25/2022  ID:  [name], DOB [birth year], MRN [id number] PCP: Larae Grooms, NP  [facility] Providers Cardiologist:  Debbe Odea, MD { Click to update primary MD,subspecialty MD or APP then REFRESH:1}   History of Present Illness: .   Janet Murray is a 52 y.o. female with history of hyperlipidemia, current smoker, coronary artery disease, chronic bronchitis, COPD, fatty liver disease, IBS, acquired hypothyroidism, osteoarthritis, radiculopathy of the lumbar spine, restless leg syndrome, history of alcohol abuse,who is here today for follow-up.  ROS: ***  Studies Reviewed: .        *** Risk Assessment/Calculations:   {Does this patient have ATRIAL FIBRILLATION?:[phone]} No BP recorded.  {Refresh Note OR Click here to enter BP  :1}***       Physical Exam:   VS:  There were no vitals taken for this visit.   Wt Readings from Last 3 Encounters:  12/16/22 187 lb 3.2 oz (84.9 kg)  11/11/22 188 lb 12.8 oz (85.6 kg)  10/27/22 185 lb (83.9 kg)    GEN: Well nourished, well developed in no acute distress NECK: No JVD; No carotid bruits CARDIAC: ***RRR, no murmurs, rubs, gallops RESPIRATORY:  Clear to auscultation without rales, wheezing or rhonchi  ABDOMEN: Soft, non-tender, non-distended EXTREMITIES:  No edema; No deformity   ASSESSMENT AND PLAN: .   ***    {Are you ordering a CV Procedure (e.g. stress test, cath, DCCV, TEE, etc)?   Press F2        :[id number]}  Dispo: ***  Signed,  , NP

## 2022-12-29 ENCOUNTER — Encounter: Payer: Self-pay | Admitting: Student in an Organized Health Care Education/Training Program

## 2022-12-29 ENCOUNTER — Ambulatory Visit
Payer: 59 | Attending: Student in an Organized Health Care Education/Training Program | Admitting: Student in an Organized Health Care Education/Training Program

## 2022-12-29 VITALS — BP 132/79 | HR 67 | Temp 97.6°F | Resp 18 | Ht 61.0 in | Wt 186.0 lb

## 2022-12-29 DIAGNOSIS — G894 Chronic pain syndrome: Secondary | ICD-10-CM | POA: Insufficient documentation

## 2022-12-29 DIAGNOSIS — M48062 Spinal stenosis, lumbar region with neurogenic claudication: Secondary | ICD-10-CM | POA: Diagnosis not present

## 2022-12-29 DIAGNOSIS — M48061 Spinal stenosis, lumbar region without neurogenic claudication: Secondary | ICD-10-CM | POA: Diagnosis not present

## 2022-12-29 NOTE — Progress Notes (Signed)
PROVIDER NOTE: Information contained herein reflects review and annotations entered in association with encounter. Interpretation of such information and data should be left to medically-trained personnel. Information provided to patient can be located elsewhere in the medical record under "Patient Instructions". Document created using STT-dictation technology, any transcriptional errors that may result from process are unintentional.    Patient: Janet Murray  Service Category: E/M  Provider: Edward Jolly, MD  DOB: 1970/07/21  DOS: 12/29/2022  Referring Provider: Larae Grooms, NP  MRN: [id number]  Specialty: Interventional Pain Management  PCP: Larae Grooms, NP  Type: Established Patient  Setting: Ambulatory outpatient    Location: Office  Delivery: Face-to-face     HPI  Ms. ALEETA SCHMALTZ, a 52 y.o. 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 Ms. Maselli primary complain today is Back Pain (lower)  Pertinent problems: Ms. Yorke has Osteoarthritis of spine with radiculopathy, lumbar region; Lumbar facet arthropathy; Sacroiliac joint dysfunction of both sides; Other intervertebral disc degeneration, lumbar region; and Hereditary and idiopathic neuropathy on their pertinent problem list. Pain Assessment: Severity of Chronic pain is reported as a 6 /10. Location: Back Lower, Right/down right leg to right toes. Onset: More than a month ago. Quality: Burning, Numbness, Tingling. Timing: Constant. Modifying factor(s): heat, cold, rest. Vitals:  height is 5\' 1"  (1.549 m) and weight is 186 lb (84.4 kg). Her temporal temperature is 97.6 F (36.4 C). Her blood pressure is 132/79 and her pulse is 67. Her respiration is 18 and oxygen saturation is 99%.  BMI: Estimated body mass index is 35.14 kg/m as calculated from the following:   Height as of this encounter: 5\' 1"  (1.549 m).   Weight as of this encounter: 186 lb (84.4  kg). Last encounter: 10/06/2022. Last procedure: 10/27/2022.  Reason for encounter: post-procedure evaluation and assessment.     Post-procedure evaluation   Procedure: Lumbar Facet, Medial Branch Radiofrequency Ablation (RFA) #1  Laterality: Right (-RT)  Level: L3, L4, and L5 Medial Branch Level(s). These levels will denervate the L3-4 and L4-5 lumbar facet joints.  Imaging: Fluoroscopy-guided         Anesthesia: Local anesthesia (1-2% Lidocaine) Sedation:   Moderate conscious sedation.         DOS: 10/27/2022  Performed by: Edward Jolly, MD  Purpose: Therapeutic/Palliative Indications: Low back pain severe enough to impact quality of life or function. Indications: 1. Lumbar facet arthropathy   2. Chronic pain syndrome   3. Lumbar spondylosis (right)    Ms. Talamantez has been dealing with the above chronic pain for longer than three months and has either failed to respond, was unable to tolerate, or simply did not get enough benefit from other more conservative therapies including, but not limited to: 1. Over-the-counter medications 2. Anti-inflammatory medications 3. Muscle relaxants 4. Membrane stabilizers 5. Opioids 6. Physical therapy and/or chiropractic manipulation 7. Modalities (Heat, ice, etc.) 8. Invasive techniques such as nerve blocks. Ms. Sebree has attained more than 50% relief of the pain from a series of diagnostic injections conducted in separate occasions.  Pain Score: Pre-procedure: 7 /10 Post-procedure: 0-No pain/10     Effectiveness:  Initial hour after procedure: 100 %  Subsequent 4-6 hours post-procedure: 100 %  Analgesia past initial 6 hours: 100 % (X 2 days, "within 2 weeks pain returned to pre procedure level")  Ongoing improvement:  Analgesic:  back to baseline    ROS  Constitutional: Denies any fever or chills  Gastrointestinal: No reported hemesis, hematochezia, vomiting, or acute GI distress Musculoskeletal:  right low back pain with  radiation to right anterolateral thigh and right lateral leg Neurological: No reported episodes of acute onset apraxia, aphasia, dysarthria, agnosia, amnesia, paralysis, loss of coordination, or loss of consciousness  Medication Review  AMBULATORY NON FORMULARY MEDICATION, Glycopyrrolate-Formoterol, Vitamin D (Ergocalciferol), albuterol, atorvastatin, diclofenac Sodium, esomeprazole, fluticasone, hydrOXYzine, levothyroxine, liraglutide, metoprolol tartrate, montelukast, naloxone, ondansetron, oxyCODONE, pramipexole, tiZANidine, and triamcinolone cream  History Review  Allergy: Ms. Berkland is allergic to penicillins, acetaminophen, ciprofloxacin, aspirin, hydrocodone, and tape. Drug: Ms. Disla  reports no history of drug use. Alcohol:  reports that she does not currently use alcohol. Tobacco:  reports that she has been smoking cigarettes. She has a 12.5 pack-year smoking history. She has never used smokeless tobacco. Social: Ms. Bacha  reports that she has been smoking cigarettes. She has a 12.5 pack-year smoking history. She has never used smokeless tobacco. She reports that she does not currently use alcohol. She reports that she does not use drugs. Medical:  has a past medical history of Abdominal mass, RUQ (right upper quadrant), Anxiety, Arthritis, Asthma, Bronchitis, Cancer (HCC) (04/12/2021), Carpal tunnel syndrome of left wrist (10/25/2014), Chronic bronchitis, obstructive (07/20/2017), Chronic pain, Colon cancer (HCC), COPD (chronic obstructive pulmonary disease) (HCC), Coronary artery disease, Cyst of right kidney (03/22/2018), DDD (degenerative disc disease), lumbar (10/25/2014), Degenerative joint disease (DJD) of hip, Diarrhea of infectious origin (03/25/2018), Dysphagia, Dyspnea, Emphysema of lung (HCC), Facet syndrome, lumbar (10/25/2014), Fatty liver (03/25/2018), Foot fracture, left (05/02/2015), 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), GERD (gastroesophageal reflux disease), Hereditary and  idiopathic peripheral neuropathy (11/06/2014), Hypothyroidism, IBS (irritable bowel syndrome), Low back pain, Migraine (02/04/2016), Occipital neuralgia, Osteoarthritis of spine with radiculopathy, lumbar region (10/25/2014), Other intervertebral disc degeneration, lumbar region, Other irritable bowel syndrome (04/20/2017), Other spondylosis with radiculopathy, lumbar region (10/25/2014), RLS (restless legs syndrome), Sacroiliac joint dysfunction of both sides (10/25/2014), Spondylosis of lumbar spine, Stomach cancer (HCC), and Urinary incontinence. Surgical: Ms. Rochette  has a past surgical history that includes Bladder surgery; Cesarean section; Appendectomy; Total abdominal hysterectomy w/ bilateral salpingoophorectomy; Esophagogastroduodenoscopy (04/26/2019); Stomach surgery; Colonoscopy with propofol (N/A, 04/11/2020); Thyroidectomy (N/A, 04/12/2021); Colonoscopy with propofol (N/A, 10/22/2021); and Abdominal hysterectomy. Family: family history includes Arthritis in her father; Atrial fibrillation in her mother; Dementia in her maternal grandmother; Healthy in her brother, brother, brother, daughter, and son; Heart disease in her father and maternal grandmother; Hypercholesterolemia in her maternal grandmother; Hypertension in her father and maternal grandmother; Leukemia in her father; Neuropathy in her mother.  Laboratory Chemistry Profile   Renal Lab Results  Component Value Date   BUN 7 08/22/2022   CREATININE 0.69 08/22/2022   BCR 12 07/10/2022   GFRAA 127 01/19/2020   GFRNONAA >60 08/22/2022    Hepatic Lab Results  Component Value Date   AST 10 07/10/2022   ALT 7 07/10/2022   ALBUMIN 4.5 07/10/2022   ALKPHOS 82 07/10/2022   LIPASE 25 06/28/2022    Electrolytes Lab Results  Component Value Date   NA 134 (L) 08/22/2022   K 3.7 08/22/2022   CL 102 08/22/2022   CALCIUM 9.0 08/22/2022    Bone Lab Results  Component Value Date   VD25OH 10.1 (L) 12/27/2021    Inflammation (CRP:  Acute Phase) (ESR: Chronic Phase) No results found for: "CRP", "ESRSEDRATE", "LATICACIDVEN"       Note: Above Lab results reviewed.  Recent Imaging Review  ECHOCARDIOGRAM COMPLETE    ECHOCARDIOGRAM REPORT  Patient Name:   MARIEANNE MARXEN Date of Exam: 12/15/2022 Medical Rec #:  [id number]     Height:       61.0 in Accession #:    [id number]    Weight:       188.8 lb Date of Birth:  [birth year]      BSA:          1.843 m Patient Age:    52 years      BP:           130/84 mmHg Patient Gender: F             HR:           68 bpm. Exam Location:  [facility]  Procedure: 2D Echo, 3D Echo, Cardiac Doppler, Color Doppler and Strain Analysis  Indications:    R06.02 SOB; R07.9* Chest pain, unspecified   History:        Patient has no prior history of Echocardiogram examinations.                 CAD, COPD, Signs/Symptoms:Chest Pain and Shortness of Breath;                 Risk Factors:Dyslipidemia and Current Smoker.   Sonographer:    Quentin Ore RDMS, RVT, RDCS Referring Phys: [id number] BRIAN AGBOR-ETANG  IMPRESSIONS   1. Left ventricular ejection fraction, by estimation, is 55 to 60%. Left ventricular ejection fraction by 2D MOD biplane is 56.8 %. The left ventricle has normal function. The left ventricle has no regional wall motion abnormalities. Left ventricular  diastolic parameters were normal. The average left ventricular global longitudinal strain is -18.5 %. The global longitudinal strain is normal.  2. Right ventricular systolic function is normal. The right ventricular size is normal.  3. The mitral valve is normal in structure. No evidence of mitral valve regurgitation.  4. The aortic valve is tricuspid. Aortic valve regurgitation is not visualized.  5. The inferior vena cava is normal in size with greater than 50% respiratory variability, suggesting right atrial pressure of 3 mmHg.  FINDINGS  Left Ventricle: Left ventricular ejection fraction, by estimation, is 55 to 60%. Left  ventricular ejection fraction by 2D MOD biplane is 56.8 %. The left ventricle has normal function. The left ventricle has no regional wall motion abnormalities. The  average left ventricular global longitudinal strain is -18.5 %. The global longitudinal strain is normal. The left ventricular internal cavity size was normal in size. There is no left ventricular hypertrophy. Left ventricular diastolic parameters were  normal.  Right Ventricle: The right ventricular size is normal. No increase in right ventricular wall thickness. Right ventricular systolic function is normal.  Left Atrium: Left atrial size was normal in size.  Right Atrium: Right atrial size was normal in size.  Pericardium: There is no evidence of pericardial effusion.  Mitral Valve: The mitral valve is normal in structure. No evidence of mitral valve regurgitation.  Tricuspid Valve: The tricuspid valve is normal in structure. Tricuspid valve regurgitation is not demonstrated.  Aortic Valve: The aortic valve is tricuspid. Aortic valve regurgitation is not visualized. Aortic valve mean gradient measures 3.0 mmHg. Aortic valve peak gradient measures 6.1 mmHg. Aortic valve area, by VTI measures 2.96 cm.  Pulmonic Valve: The pulmonic valve was normal in structure. Pulmonic valve regurgitation is not visualized.  Aorta: The aortic root is normal in size and structure.  Venous: The inferior vena cava is normal in size with greater than 50%  respiratory variability, suggesting right atrial pressure of 3 mmHg.  IAS/Shunts: No atrial level shunt detected by color flow Doppler.    LEFT VENTRICLE PLAX 2D                        Biplane EF (MOD) LVIDd:         5.10 cm         LV Biplane EF:   Left LVIDs:         3.85 cm                          ventricular LV PW:         0.85 cm                          ejection LV IVS:        0.85 cm                          fraction by LVOT diam:     1.80 cm                          2D MOD LV  SV:         70                               biplane is LV SV Index:   38                               56.8 %. LVOT Area:     2.54 cm                                Diastology                                LV e' medial:    10.80 cm/s LV Volumes (MOD)               LV E/e' medial:  8.2 LV vol d, MOD    84.9 ml       LV e' lateral:   14.10 cm/s A2C:                           LV E/e' lateral: 6.3 LV vol d, MOD    80.2 ml A4C:                           2D LV vol s, MOD    36.2 ml       Longitudinal A2C:                           Strain LV vol s, MOD    34.8 ml       2D Strain GLS  -18.5 % A4C:                           Avg: LV SV MOD A2C:   48.7 ml  LV SV MOD A4C:   80.2 ml LV SV MOD BP:    47.0 ml                                3D Volume EF:                                3D EF:        55 %                                LV EDV:       101 ml                                LV ESV:       45 ml                                LV SV:        56 ml  RIGHT VENTRICLE             IVC RV Basal diam:  3.70 cm     IVC diam: 1.50 cm RV S prime:     11.50 cm/s TAPSE (M-mode): 2.0 cm  LEFT ATRIUM             Index        RIGHT ATRIUM           Index LA diam:        2.80 cm 1.52 cm/m   RA Area:     16.10 cm LA Vol (A2C):   52.1 ml 28.27 ml/m  RA Volume:   40.60 ml  22.03 ml/m LA Vol (A4C):   28.9 ml 15.68 ml/m LA Biplane Vol: 39.6 ml 21.49 ml/m  AORTIC VALVE                    PULMONIC VALVE AV Area (Vmax):    2.69 cm     PV Vmax:       1.04 m/s AV Area (Vmean):   2.61 cm     PV Peak grad:  4.3 mmHg AV Area (VTI):     2.96 cm AV Vmax:           123.00 cm/s AV Vmean:          79.900 cm/s AV VTI:            0.237 m AV Peak Grad:      6.1 mmHg AV Mean Grad:      3.0 mmHg LVOT Vmax:         130.00 cm/s LVOT Vmean:        81.800 cm/s LVOT VTI:          0.276 m LVOT/AV VTI ratio: 1.16   AORTA Ao Root diam: 2.70 cm Ao Arch diam: 2.2 cm  MITRAL VALVE MV Area (PHT): 3.21 cm     SHUNTS MV Decel Time: 236 msec    Systemic VTI:  0.28 m MV E velocity: 88.60 cm/s  Systemic Diam: 1.80 cm MV A velocity: 71.30 cm/s MV E/A ratio:  1.24  Debbe Odea MD Electronically signed by Debbe Odea MD Signature Date/Time: 12/15/2022/5:36:35  PM      Final   Note: Reviewed        Physical Exam  General appearance: Well nourished, well developed, and well hydrated. In no apparent acute distress Mental status: Alert, oriented x 3 (person, place, & time)       Respiratory: No evidence of acute respiratory distress Eyes: PERLA Vitals: BP 132/79   Pulse 67   Temp 97.6 F (36.4 C) (Temporal)   Resp 18   Ht 5\' 1"  (1.549 m)   Wt 186 lb (84.4 kg)   SpO2 99%   BMI 35.14 kg/m  BMI: Estimated body mass index is 35.14 kg/m as calculated from the following:   Height as of this encounter: 5\' 1"  (1.549 m).   Weight as of this encounter: 186 lb (84.4 kg). Ideal: Ideal body weight: 47.8 kg (105 lb 6.1 oz) Adjusted ideal body weight: 62.4 kg (137 lb 10 oz)  Thoracic Spine Area Exam  Skin & Axial Inspection: No masses, redness, or swelling Alignment: Symmetrical Functional ROM: Pain restricted ROM Stability: No instability detected Muscle Tone/Strength: Functionally intact. No obvious neuro-muscular anomalies detected. Sensory (Neurological): Unimpaired Muscle strength & Tone: No palpable anomalies Lumbar Spine Area Exam  Skin & Axial Inspection: No masses, redness, or swelling Alignment: Symmetrical Functional ROM: Pain restricted ROM       Stability: No instability detected Muscle Tone/Strength: Functionally intact. No obvious neuro-muscular anomalies detected. Sensory (Neurological): Dermatomal pain pattern possibly at L4/5 and MSK   Lower Extremity Exam      Side: Right lower extremity   Side: Left lower extremity  Stability: No instability observed           Stability: No instability observed          Skin & Extremity Inspection: Skin color, temperature, and  hair growth are WNL. No peripheral edema or cyanosis. No masses, redness, swelling, asymmetry, or associated skin lesions. No contractures.   Skin & Extremity Inspection: Skin color, temperature, and hair growth are WNL. No peripheral edema or cyanosis. No masses, redness, swelling, asymmetry, or associated skin lesions. No contractures.  Functional ROM: Unrestricted ROM                   Functional ROM: Unrestricted ROM                  Muscle Tone/Strength: Functionally intact. No obvious neuro-muscular anomalies detected.   Muscle Tone/Strength: Functionally intact. No obvious neuro-muscular anomalies detected.  Sensory (Neurological): Neuropathic pain pattern         Sensory (Neurological): Neuropathic pain pattern        DTR: Patellar: deferred today Achilles: deferred today Plantar: deferred today   DTR: Patellar: deferred today Achilles: deferred today Plantar: deferred today  Palpation: No palpable anomalies   Palpation: No palpable anomalies    5 out of 5 strength bilateral lower extremity: Plantar flexion, dorsiflexion, knee flexion, knee extension.    Assessment   Diagnosis Status  1. 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 Chronic pain syndrome    Persistent Having a Flare-up Controlled    Plan of Care   Orders:  Orders Placed This Encounter  Procedures   Lumbar Epidural Injection    Standing Status:   Future    Standing Expiration Date:   03/31/2023    Scheduling Instructions:     Procedure: Interlaminar Lumbar Epidural Steroid injection (LESI)  Laterality: Midline- RIGHT L4/5     Sedation: Patient's choice.     Timeframe: ASAA    Order Specific Question:   Where will this procedure be performed?    Answer:   [facility]   Follow-up plan:   Return for Right L4/5 ESI, NS.      Right L4/5 ESI 04/23/22: 20-25% pain relief for less than a week; right L3,  L4, L5 medial branch nerve block 07/09/2022, 09/08/22, Right L3,4,5 RFA 10/27/22  Right SI-J/piriformis, right hip bursa injection        Recent Visits Date Type Provider Dept  10/27/22 Procedure visit Edward Jolly, MD [facility]  10/06/22 Office Visit Edward Jolly, MD [facility]  Showing recent visits within past 90 days and meeting all other requirements Today's Visits Date Type Provider Dept  12/29/22 Office Visit Edward Jolly, MD [facility]  Showing today's visits and meeting all other requirements Future Appointments No visits were found meeting these conditions. Showing future appointments within next 90 days and meeting all other requirements  I discussed the assessment and treatment plan with the patient. The patient was provided an opportunity to ask questions and all were answered. The patient agreed with the plan and demonstrated an understanding of the instructions.  Patient advised to call back or seek an in-person evaluation if the symptoms or condition worsens.  Duration of encounter: 15 minutes.  Total time on encounter, as per AMA guidelines included both the face-to-face and non-face-to-face time personally spent by the physician and/or other qualified health care professional(s) on the day of the encounter (includes time in activities that require the physician or other qualified health care professional and does not include time in activities normally performed by clinical staff). Physician's time may include the following activities when performed: Preparing to see the patient (e.g., pre-charting review of records, searching for previously ordered imaging, lab work, and nerve conduction tests) Review of prior analgesic pharmacotherapies. Reviewing PMP Interpreting ordered tests (e.g., lab work, imaging, nerve conduction tests) Performing post-procedure evaluations, including interpretation of diagnostic procedures Obtaining and/or  reviewing separately obtained history Performing a medically appropriate examination and/or evaluation Counseling and educating the patient/family/caregiver Ordering medications, tests, or procedures Referring and communicating with other health care professionals (when not separately reported) Documenting clinical information in the electronic or other health record Independently interpreting results (not separately reported) and communicating results to the patient/ family/caregiver Care coordination (not separately reported)  Note by: Edward Jolly, MD Date: 12/29/2022; Time: 2:44 PM

## 2022-12-29 NOTE — Progress Notes (Signed)
Safety precautions to be maintained throughout the outpatient stay will include: orient to surroundings, keep bed in low position, maintain call bell within reach at all times, provide assistance with transfer out of bed and ambulation.  

## 2022-12-29 NOTE — Patient Instructions (Signed)
GENERAL RISKS AND COMPLICATIONS ° °What are the risk, side effects and possible complications? °Generally speaking, most procedures are safe.  However, with any procedure there are risks, side effects, and the possibility of complications.  The risks and complications are dependent upon the sites that are lesioned, or the type of nerve block to be performed.  The closer the procedure is to the spine, the more serious the risks are.  Great care is taken when placing the radio frequency needles, block needles or lesioning probes, but sometimes complications can occur. °Infection: Any time there is an injection through the skin, there is a risk of infection.  This is why sterile conditions are used for these blocks.  There are four possible types of infection. °Localized skin infection. °Central Nervous System Infection-This can be in the form of Meningitis, which can be deadly. °Epidural Infections-This can be in the form of an epidural abscess, which can cause pressure inside of the spine, causing compression of the spinal cord with subsequent paralysis. This would require an emergency surgery to decompress, and there are no guarantees that the patient would recover from the paralysis. °Discitis-This is an infection of the intervertebral discs.  It occurs in about 1% of discography procedures.  It is difficult to treat and it may lead to surgery. ° °      2. Pain: the needles have to go through skin and soft tissues, will cause soreness. °      3. Damage to internal structures:  The nerves to be lesioned may be near blood vessels or   ° other nerves which can be potentially damaged. °      4. Bleeding: Bleeding is more common if the patient is taking blood thinners such as  aspirin, Coumadin, Ticiid, Plavix, etc., or if he/she have some genetic predisposition  such as hemophilia. Bleeding into the spinal canal can cause compression of the spinal  cord with subsequent paralysis.  This would require an emergency  surgery to  decompress and there are no guarantees that the patient would recover from the  paralysis. °      5. Pneumothorax:  Puncturing of a lung is a possibility, every time a needle is introduced in  the area of the chest or upper back.  Pneumothorax refers to free air around the  collapsed lung(s), inside of the thoracic cavity (chest cavity).  Another two possible  complications related to a similar event would include: Hemothorax and Chylothorax.   These are variations of the Pneumothorax, where instead of air around the collapsed  lung(s), you may have blood or chyle, respectively. °      6. Spinal headaches: They may occur with any procedures in the area of the spine. °      7. Persistent CSF (Cerebro-Spinal Fluid) leakage: This is a rare problem, but may occur  with prolonged intrathecal or epidural catheters either due to the formation of a fistulous  track or a dural tear. °      8. Nerve damage: By working so close to the spinal cord, there is always a possibility of  nerve damage, which could be as serious as a permanent spinal cord injury with  paralysis. °      9. Death:  Although rare, severe deadly allergic reactions known as "Anaphylactic  reaction" can occur to any of the medications used. °     10. Worsening of the symptoms:  We can always make thing worse. ° °What are the chances   of something like this happening? Chances of any of this occuring are extremely low.  By statistics, you have more of a chance of getting killed in a motor vehicle accident: while driving to the hospital than any of the above occurring .  Nevertheless, you should be aware that they are possibilities.  In general, it is similar to taking a shower.  Everybody knows that you can slip, hit your head and get killed.  Does that mean that you should not shower again?  Nevertheless always keep in mind that statistics do not mean anything if you happen to be on the wrong side of them.  Even if a procedure has a 1 (one) in a  1,000,000 (million) chance of going wrong, it you happen to be that one..Also, keep in mind that by statistics, you have more of a chance of having something go wrong when taking medications.  Who should not have this procedure? If you are on a blood thinning medication (e.g. Coumadin, Plavix, see list of "Blood Thinners"), or if you have an active infection going on, you should not have the procedure.  If you are taking any blood thinners, please inform your physician.  How should I prepare for this procedure? Do not eat or drink anything at least six hours prior to the procedure. Bring a driver with you .  It cannot be a taxi. Come accompanied by an adult that can drive you back, and that is strong enough to help you if your legs get weak or numb from the local anesthetic. Take all of your medicines the morning of the procedure with just enough water to swallow them. If you have diabetes, make sure that you are scheduled to have your procedure done first thing in the morning, whenever possible. If you have diabetes, take only half of your insulin dose and notify our nurse that you have done so as soon as you arrive at the clinic. If you are diabetic, but only take blood sugar pills (oral hypoglycemic), then do not take them on the morning of your procedure.  You may take them after you have had the procedure. Do not take aspirin or any aspirin-containing medications, at least eleven (11) days prior to the procedure.  They may prolong bleeding. Wear loose fitting clothing that may be easy to take off and that you would not mind if it got stained with Betadine or blood. Do not wear any jewelry or perfume Remove any nail coloring.  It will interfere with some of our monitoring equipment.  NOTE: Remember that this is not meant to be interpreted as a complete list of all possible complications.  Unforeseen problems may occur.  BLOOD THINNERS The following drugs contain aspirin or other products,  which can cause increased bleeding during surgery and should not be taken for 2 weeks prior to and 1 week after surgery.  If you should need take something for relief of minor pain, you may take acetaminophen which is found in Tylenol,m Datril, Anacin-3 and Panadol. It is not blood thinner. The products listed below are.  Do not take any of the products listed below in addition to any listed on your instruction sheet.  A.P.C or A.P.C with Codeine Codeine Phosphate Capsules #3 Ibuprofen Ridaura  ABC compound Congesprin Imuran rimadil  Advil Cope Indocin Robaxisal  Alka-Seltzer Effervescent Pain Reliever and Antacid Coricidin or Coricidin-D  Indomethacin Rufen  Alka-Seltzer plus Cold Medicine Cosprin Ketoprofen S-A-C Tablets  Anacin Analgesic Tablets or Capsules Coumadin   Korlgesic Salflex  Anacin Extra Strength Analgesic tablets or capsules CP-2 Tablets Lanoril Salicylate  Anaprox Cuprimine Capsules Levenox Salocol  Anexsia-D Dalteparin Magan Salsalate  Anodynos Darvon compound Magnesium Salicylate Sine-off  Ansaid Dasin Capsules Magsal Sodium Salicylate  Anturane Depen Capsules Marnal Soma  APF Arthritis pain formula Dewitt's Pills Measurin Stanback  Argesic Dia-Gesic Meclofenamic Sulfinpyrazone  Arthritis Bayer Timed Release Aspirin Diclofenac Meclomen Sulindac  Arthritis pain formula Anacin Dicumarol Medipren Supac  Analgesic (Safety coated) Arthralgen Diffunasal Mefanamic Suprofen  Arthritis Strength Bufferin Dihydrocodeine Mepro Compound Suprol  Arthropan liquid Dopirydamole Methcarbomol with Aspirin Synalgos  ASA tablets/Enseals Disalcid Micrainin Tagament  Ascriptin Doan's Midol Talwin  Ascriptin A/D Dolene Mobidin Tanderil  Ascriptin Extra Strength Dolobid Moblgesic Ticlid  Ascriptin with Codeine Doloprin or Doloprin with Codeine Momentum Tolectin  Asperbuf Duoprin Mono-gesic Trendar  Aspergum Duradyne Motrin or Motrin IB Triminicin  Aspirin plain, buffered or enteric coated  Durasal Myochrisine Trigesic  Aspirin Suppositories Easprin Nalfon Trillsate  Aspirin with Codeine Ecotrin Regular or Extra Strength Naprosyn Uracel  Atromid-S Efficin Naproxen Ursinus  Auranofin Capsules Elmiron Neocylate Vanquish  Axotal Emagrin Norgesic Verin  Azathioprine Empirin or Empirin with Codeine Normiflo Vitamin E  Azolid Emprazil Nuprin Voltaren  Bayer Aspirin plain, buffered or children's or timed BC Tablets or powders Encaprin Orgaran Warfarin Sodium  Buff-a-Comp Enoxaparin Orudis Zorpin  Buff-a-Comp with Codeine Equegesic Os-Cal-Gesic   Buffaprin Excedrin plain, buffered or Extra Strength Oxalid   Bufferin Arthritis Strength Feldene Oxphenbutazone   Bufferin plain or Extra Strength Feldene Capsules Oxycodone with Aspirin   Bufferin with Codeine Fenoprofen Fenoprofen Pabalate or Pabalate-SF   Buffets II Flogesic Panagesic   Buffinol plain or Extra Strength Florinal or Florinal with Codeine Panwarfarin   Buf-Tabs Flurbiprofen Penicillamine   Butalbital Compound Four-way cold tablets Penicillin   Butazolidin Fragmin Pepto-Bismol   Carbenicillin Geminisyn Percodan   Carna Arthritis Reliever Geopen Persantine   Carprofen Gold's salt Persistin   Chloramphenicol Goody's Phenylbutazone   Chloromycetin Haltrain Piroxlcam   Clmetidine heparin Plaquenil   Cllnoril Hyco-pap Ponstel   Clofibrate Hydroxy chloroquine Propoxyphen         Before stopping any of these medications, be sure to consult the physician who ordered them.  Some, such as Coumadin (Warfarin) are ordered to prevent or treat serious conditions such as "deep thrombosis", "pumonary embolisms", and other heart problems.  The amount of time that you may need off of the medication may also vary with the medication and the reason for which you were taking it.  If you are taking any of these medications, please make sure you notify your pain physician before you undergo any procedures.         Moderate Conscious  Sedation, Adult Sedation is the use of medicines to help you relax and not feel pain. Moderate conscious sedation is a type of sedation that makes you less alert than normal. You are still able to respond to instructions, touch, or both. This type of sedation is used during short medical and dental procedures. It is milder than deep sedation, which is a type of sedation you cannot be easily woken up from. It is also milder than general anesthesia, which is the use of medicines to make you fall asleep. Moderate conscious sedation lets you return to your normal activities sooner. Tell a health care provider about: Any allergies you have. All medicines you are taking, including vitamins, herbs, steroids, eye drops, creams, and over-the-counter medicines. Any problems you or family members have had with anesthesia.  Any bleeding problems you have. Any surgeries you have had. Any medical conditions you have. Whether you are pregnant or may be pregnant. Any recent alcohol, tobacco, or drug use. What are the risks? Your health care provider will talk with you about risks. These may include: Oversedation. This is when you get too much medicine. Nausea or vomiting. Allergic reaction to medicines. Trouble breathing. If this happens, a breathing tube may be used. It will be removed when you can breathe better on your own. Heart trouble. Lung trouble. Emergence delirium. This is when you feel confused while the sedation wears off. This gets better with time. What happens before the procedure? When to stop eating and drinking Follow instructions from your health care provider about what you may eat and drink. These may include: 8 hours before your procedure Stop eating most foods. Do not eat meat, fried foods, or fatty foods. Eat only light foods, such as toast or crackers. All liquids are okay except energy drinks and alcohol. 6 hours before your procedure Stop eating. Drink only clear liquids, such  as water, clear fruit juice, black coffee, plain tea, and sports drinks. Do not drink energy drinks or alcohol. 2 hours before your procedure Stop drinking all liquids. You may be allowed to take medicines with small sips of water. If you do not follow your health care provider's instructions, your procedure may be delayed or canceled. Medicines Ask your health care provider about: Changing or stopping your regular medicines. These include any diabetes medicines or blood thinners you take. Taking medicines such as aspirin and ibuprofen. These medicines can thin your blood. Do not take them unless your health care provider tells you to. Taking over-the-counter medicines, vitamins, herbs, and supplements. Tests and exams You may have an exam or testing. You may have a blood or urine sample taken. General instructions Do not use any products that contain nicotine or tobacco for at least 4 weeks before the procedure. These products include cigarettes, chewing tobacco, and vaping devices, such as e-cigarettes. If you need help quitting, ask your health care provider. If you will be going home right after the procedure, plan to have a responsible adult: Take you home from the hospital or clinic. You will not be allowed to drive. Care for you for the time you are told. What happens during the procedure?  You will be given the sedative. It may be given: As a pill you can take by mouth. It can also be put into the rectum. As a spray through the nose. As an injection into muscle. As an injection into a vein through an IV. You may be given oxygen as needed. Your blood pressure, heart rate, breathing rate, and blood oxygen level will be monitored during the procedure. The medical or dental procedure will be done. The procedure may vary among health care providers and hospitals. What happens after the procedure? Your blood pressure, heart rate, breathing rate, and blood oxygen level will be  monitored until you leave the hospital or clinic. You will get fluids through an IV as needed. Do not drive or operate machinery until your health care provider says that it is safe. This information is not intended to replace advice given to you by your health care provider. Make sure you discuss any questions you have with your health care provider. Document Revised: 12/09/2021 Document Reviewed: 12/09/2021 [employer] Patient Education  2024 [employer] Epidural Steroid Injection Patient Information  Description: The epidural space surrounds the nerves as  they exit the spinal cord.  In some patients, the nerves can be compressed and inflamed by a bulging disc or a tight spinal canal (spinal stenosis).  By injecting steroids into the epidural space, we can bring irritated nerves into direct contact with a potentially helpful medication.  These steroids act directly on the irritated nerves and can reduce swelling and inflammation which often leads to decreased pain.  Epidural steroids may be injected anywhere along the spine and from the neck to the low back depending upon the location of your pain.   After numbing the skin with local anesthetic (like Novocaine), a small needle is passed into the epidural space slowly.  You may experience a sensation of pressure while this is being done.  The entire block usually last less than 10 minutes.  Conditions which may be treated by epidural steroids:  Low back and leg pain Neck and arm pain Spinal stenosis Post-laminectomy syndrome Herpes zoster (shingles) pain Pain from compression fractures  Preparation for the injection:  Do not eat any solid food or dairy products within 8 hours of your appointment.  You may drink clear liquids up to 3 hours before appointment.  Clear liquids include water, black coffee, juice or soda.  No milk or cream please. You may take your regular medication, including pain medications, with a sip of water before your  appointment  Diabetics should hold regular insulin (if taken separately) and take 1/2 normal NPH dos the morning of the procedure.  Carry some sugar containing items with you to your appointment. A driver must accompany you and be prepared to drive you home after your procedure.  Bring all your current medications with your. An IV may be inserted and sedation may be given at the discretion of the physician.   A blood pressure cuff, EKG and other monitors will often be applied during the procedure.  Some patients may need to have extra oxygen administered for a short period. You will be asked to provide medical information, including your allergies, prior to the procedure.  We must know immediately if you are taking blood thinners (like Coumadin/Warfarin)  Or if you are allergic to IV iodine contrast (dye). We must know if you could possible be pregnant.  Possible side-effects: Bleeding from needle site Infection (rare, may require surgery) Nerve injury (rare) Numbness & tingling (temporary) Difficulty urinating (rare, temporary) Spinal headache ( a headache worse with upright posture) Light -headedness (temporary) Pain at injection site (several days) Decreased blood pressure (temporary) Weakness in arm/leg (temporary) Pressure sensation in back/neck (temporary)  Call if you experience: Fever/chills associated with headache or increased back/neck pain. Headache worsened by an upright position. New onset weakness or numbness of an extremity below the injection site Hives or difficulty breathing (go to the emergency room) Inflammation or drainage at the infection site Severe back/neck pain Any new symptoms which are concerning to you  Please note:  Although the local anesthetic injected can often make your back or neck feel good for several hours after the injection, the pain will likely return.  It takes 3-7 days for steroids to work in the epidural space.  You may not notice any pain  relief for at least that one week.  If effective, we will often do a series of three injections spaced 3-6 weeks apart to maximally decrease your pain.  After the initial series, we generally will wait several months before considering a repeat injection of the same type.  If you have any  questions, please call [phone] [facility]

## 2022-12-31 ENCOUNTER — Ambulatory Visit (INDEPENDENT_AMBULATORY_CARE_PROVIDER_SITE_OTHER): Payer: 59 | Admitting: Obstetrics

## 2022-12-31 ENCOUNTER — Encounter: Payer: Self-pay | Admitting: Obstetrics

## 2022-12-31 VITALS — BP 119/71 | HR 74 | Ht 61.0 in | Wt 186.0 lb

## 2022-12-31 DIAGNOSIS — R32 Unspecified urinary incontinence: Secondary | ICD-10-CM

## 2022-12-31 DIAGNOSIS — N3946 Mixed incontinence: Secondary | ICD-10-CM

## 2022-12-31 LAB — POCT URINALYSIS DIPSTICK
Bilirubin, UA: NEGATIVE
Glucose, UA: NEGATIVE
Ketones, UA: NEGATIVE
Nitrite, UA: NEGATIVE
Protein, UA: POSITIVE — AB
Urobilinogen, UA: 0.2 E.U./dL
pH, UA: 5 (ref 5.0–8.0)

## 2022-12-31 NOTE — Progress Notes (Signed)
Obstetrics & Gynecology Office Visit   Chief Complaint:  Chief Complaint  Patient presents with   Referral    Urgency/incontinence    History of Present Illness:  Janet Murray is a 52 yo new pateint referred by her PCP for some incontinance issues. She is single, not sexually active and lives at home alone. She is medically considered disabled due to degenerative disc disease.  She shares that she had a hysterectomy about 22 years ago, and then also had a "mesh" procedure , both by her GYN MD, Dr. Barnabas Lister for some incontinence. She was referred to [facility] for "urinary symptoms' but reports that she is presenting for a returned complaint of both stress and urge incontinence. She denies dysuria.  She has not had a GYN physical in over 20 years.   Review of Systems:  Review of Systems  Constitutional: Negative.   HENT: Negative.    Eyes: Negative.   Respiratory:  Positive for shortness of breath.        COPD  Cardiovascular: Negative.   Gastrointestinal: Negative.   Genitourinary:  Positive for frequency and urgency.  Musculoskeletal: Negative.   Neurological: Negative.   Endo/Heme/Allergies: Negative.   Psychiatric/Behavioral: Negative.       Past Medical History:  Past Medical History:  Diagnosis Date   Abdominal mass, RUQ (right upper quadrant)    Anxiety    Arthritis    Asthma    Bronchitis    Cancer (HCC) 04/12/2021   Carpal tunnel syndrome of left wrist 10/25/2014   Chronic bronchitis, obstructive 07/20/2017   Chronic pain    Colon cancer (HCC)    COPD (chronic obstructive pulmonary disease) (HCC)    Coronary artery disease    Cyst of right kidney 03/22/2018   DDD (degenerative disc disease), lumbar 10/25/2014   Degenerative joint disease (DJD) of hip    Diarrhea of infectious origin 03/25/2018   Dysphagia    Dyspnea    Emphysema of lung (HCC)    Facet syndrome, lumbar 10/25/2014   Fatty liver 03/25/2018   Foot fracture, left 05/02/2015   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   GERD (gastroesophageal reflux disease)    Hereditary and idiopathic peripheral neuropathy 11/06/2014   Hypothyroidism    IBS (irritable bowel syndrome)    Low back pain    Migraine 02/04/2016   Occipital neuralgia    Osteoarthritis of spine with radiculopathy, lumbar region 10/25/2014   Other intervertebral disc degeneration, lumbar region    Other irritable bowel syndrome 04/20/2017   Other spondylosis with radiculopathy, lumbar region 10/25/2014   RLS (restless legs syndrome)    Sacroiliac joint dysfunction of both sides 10/25/2014   Spondylosis of lumbar spine    Stomach cancer (HCC)    Urinary incontinence     Past Surgical History:  Past Surgical History:  Procedure Laterality Date   ABDOMINAL HYSTERECTOMY     APPENDECTOMY     BLADDER SURGERY     sling - Dr. Barnabas Lister   CESAREAN SECTION     COLONOSCOPY WITH PROPOFOL N/A 04/11/2020   Procedure: COLONOSCOPY WITH PROPOFOL;  Surgeon: Pasty Spillers, MD;  Location: [facility] ENDOSCOPY;  Service: Endoscopy;  Laterality: N/A;   COLONOSCOPY WITH PROPOFOL N/A 10/22/2021   Procedure: COLONOSCOPY WITH PROPOFOL;  Surgeon: Toney Reil, MD;  Location: [facility] ENDOSCOPY;  Service: Gastroenterology;  Laterality: N/A;   ESOPHAGOGASTRODUODENOSCOPY  04/26/2019   STOMACH SURGERY     THYROIDECTOMY N/A 04/12/2021   Procedure: THYROIDECTOMY, total;  Surgeon: Leafy Ro,  MD;  Location: [facility] ORS;  Service: General;  Laterality: N/A;   TOTAL ABDOMINAL HYSTERECTOMY W/ BILATERAL SALPINGOOPHORECTOMY      Gynecologic History: No LMP recorded. Patient has had a hysterectomy.  Obstetric History: G2P2002  Family History:  Family History  Problem Relation Age of Onset   Neuropathy Mother    Atrial fibrillation Mother    Arthritis Father    Hypertension Father    Leukemia Father    Heart disease Father        CAD s/p CABG   Healthy Brother    Healthy Brother    Healthy Brother    Dementia Maternal Grandmother     Hypertension Maternal Grandmother    Hypercholesterolemia Maternal Grandmother    Heart disease Maternal Grandmother    Healthy Daughter    Healthy Son    Stroke Neg Hx    Cancer Neg Hx    Breast cancer Neg Hx     Social History:  Social History   Socioeconomic History   Marital status: Single    Spouse name: Not on file   Number of children: 2   Years of education: Not on file   Highest education level: 9th grade  Occupational History   Not on file  Tobacco Use   Smoking status: Every Day    Current packs/day: 0.50    Average packs/day: 0.5 packs/day for 25.0 years (12.5 ttl pk-yrs)    Types: Cigarettes   Smokeless tobacco: Never   Tobacco comments:    0.5PPD 11/11/2022 khj  Vaping Use   Vaping status: Never Used  Substance and Sexual Activity   Alcohol use: Not Currently    Comment: occassionally   Drug use: No   Sexual activity: Not Currently    Birth control/protection: Surgical    Comment: Hysterectomy  Other Topics Concern   Not on file  Social History Narrative   Lives alone   Social Determinants of Health   Financial Resource Strain: Medium Risk (12/16/2022)   Overall Financial Resource Strain (CARDIA)    Difficulty of Paying Living Expenses: Somewhat hard  Food Insecurity: No Food Insecurity (12/16/2022)   Hunger Vital Sign    Worried About Running Out of Food in the Last Year: Never true    Ran Out of Food in the Last Year: Never true  Transportation Needs: No Transportation Needs (12/16/2022)   PRAPARE - Administrator, Civil Service (Medical): No    Lack of Transportation (Non-Medical): No  Physical Activity: Insufficiently Active (12/16/2022)   Exercise Vital Sign    Days of Exercise per Week: 5 days    Minutes of Exercise per Session: 10 min  Stress: No Stress Concern Present (12/16/2022)   [employer] of Occupational Health - Occupational Stress Questionnaire    Feeling of Stress : Not at all  Social Connections: Moderately Integrated  (12/16/2022)   Social Connection and Isolation Panel [NHANES]    Frequency of Communication with Friends and Family: More than three times a week    Frequency of Social Gatherings with Friends and Family: Three times a week    Attends Religious Services: More than 4 times per year    Active Member of Clubs or Organizations: Yes    Attends Banker Meetings: 1 to 4 times per year    Marital Status: Divorced  Intimate Partner Violence: Not At Risk (03/05/2021)   Humiliation, Afraid, Rape, and Kick questionnaire    Fear of Current or Ex-Partner: No  Emotionally Abused: No    Physically Abused: No    Sexually Abused: No    Allergies:  Allergies  Allergen Reactions   Penicillins Hives and Swelling    Has patient had a PCN reaction causing immediate rash, facial/tongue/throat swelling, SOB or lightheadedness with hypotension: Yes Has patient had a PCN reaction causing severe rash involving mucus membranes or skin necrosis: No Has patient had a PCN reaction that required hospitalization No Has patient had a PCN reaction occurring within the last 10 years: No If all of the above answers are "NO", then may proceed with Cephalosporin use.   Acetaminophen     Due to polyps    Ciprofloxacin Itching   Aspirin Palpitations    Heart palpitations   Hydrocodone Hives and Rash   Tape Rash    Per patient "clear tape"    Medications: Prior to Admission medications   Medication Sig Start Date End Date Taking? Authorizing Provider  albuterol (PROVENTIL) (2.5 MG/3ML) 0.083% nebulizer solution Take 3 mLs (2.5 mg total) by nebulization every 6 (six) hours as needed for wheezing or shortness of breath. 02/28/21  Yes Parrett, Tammy S, NP  albuterol (VENTOLIN HFA) 108 (90 Base) MCG/ACT inhaler Inhale 2 puffs into the lungs every 6 (six) hours as needed for wheezing or shortness of breath. 08/07/22  Yes Salena Saner, MD  AMBULATORY NON FORMULARY MEDICATION Medication Name: nebulizer DX:  J44.9 12/07/17  Yes Shane Crutch, MD  atorvastatin (LIPITOR) 80 MG tablet TAKE 1 TABLET BY MOUTH DAILY 07/10/22  Yes Larae Grooms, NP  diclofenac Sodium (VOLTAREN) 1 % GEL Apply 1 application topically daily as needed (Knee pain). 09/18/20  Yes [provider]  esomeprazole (NEXIUM) 40 MG capsule TAKE 1 CAPSULE BY MOUTH TWICE DAILY 12/16/22  Yes Larae Grooms, NP  fluticasone (FLONASE) 50 MCG/ACT nasal spray Place 2 sprays into both nostrils daily. 12/16/22  Yes Aura Dials T, NP  Glycopyrrolate-Formoterol (BEVESPI AEROSPHERE) 9-4.8 MCG/ACT AERO 2 puffs twice a day 08/07/22  Yes Salena Saner, MD  hydrOXYzine (ATARAX/VISTARIL) 25 MG tablet Take 1 tablet (25 mg total) by mouth 3 (three) times daily as needed. Patient taking differently: Take 25 mg by mouth 3 (three) times daily as needed for anxiety. 10/26/19  Yes Particia Nearing, PA-C  levothyroxine (SYNTHROID) 175 MCG tablet Take 1 tablet (175 mcg total) by mouth daily. 04/10/22  Yes Reather Littler, MD  liraglutide (VICTOZA) 18 MG/3ML SOPN INJECT 1.2 MG INTO THE SKIN DAILY 04/16/22  Yes Larae Grooms, NP  montelukast (SINGULAIR) 10 MG tablet TAKE 1 TABLET BY MOUTH AT BEDTIME 11/12/22  Yes Larae Grooms, NP  naloxone [facility]) nasal spray 4 mg/0.1 mL SMARTSIG:Both Nares 06/19/21  Yes [provider]  ondansetron (ZOFRAN-ODT) 4 MG disintegrating tablet TAKE 1 TABLET BY MOUTH EVERY 8 HOURS AS NEEDED 11/27/21  Yes Cannady, Jolene T, NP  oxyCODONE (OXY IR/ROXICODONE) 5 MG immediate release tablet Take 1 tablet (5 mg total) by mouth every 4 (four) hours as needed for severe pain. 04/13/21  Yes Pabon, Diego F, MD  pramipexole (MIRAPEX) 0.5 MG tablet TAKE 1 TABLET BY MOUTH 3 TIMES DAILY 12/16/22  Yes Larae Grooms, NP  tiZANidine (ZANAFLEX) 2 MG tablet Take 2 mg by mouth 2 (two) times daily. 09/18/21  Yes [provider]  triamcinolone cream (KENALOG) 0.1 % Apply 1 Application topically 2 (two) times daily.  12/16/22  Yes Cannady, Jolene T, NP  Vitamin D, Ergocalciferol, (DRISDOL) 1.25 MG (50000 UNIT) CAPS capsule Take 1 capsule (  50,000 Units total) by mouth every 7 (seven) days. 12/30/21  Yes Larae Grooms, NP  metoprolol tartrate (LOPRESSOR) 100 MG tablet Take 1 tablet (100 mg total) by mouth once for 1 dose. TWO HOURS PRIOR TO CARDIAC CTA 10/16/22 12/29/22  Debbe Odea, MD    Physical Exam Vitals:  Vitals:   12/31/22 1417  BP: 119/71  Pulse: 74   No LMP recorded. Patient has had a hysterectomy.  Physical Exam Constitutional:      Appearance: Normal appearance.  HENT:     Head: Normocephalic and atraumatic.  Cardiovascular:     Rate and Rhythm: Normal rate and regular rhythm.     Pulses: Normal pulses.     Heart sounds: Normal heart sounds.  Pulmonary:     Effort: Pulmonary effort is normal.     Breath sounds: Normal breath sounds.  Abdominal:     Palpations: Abdomen is soft.  Genitourinary:    Comments: CS scar line visible. No external irritation or lesions. There is a linear shaped dark [place] mole noted just outside her left labia majora.spec exam- some irregular tissue noted on the anteriorarea of the vagina just inside her introitus. No obvious discharge at the back of the vagina Uncertain re the presence of a cervix- she is not sure whether Dr. Barnabas Lister left cervical tissue- has not had a pap smear in many years. No adnexal tenderness Some slight rectal bulging Neurological:     Mental Status: She is alert.      Assessment: 52 y.o. [id number] referred by PCP for evaluation of incontinence. Former mesh procedure ? Cytocele versus prolapsed anterior vaginal wall /site of previous mesh placement. Hematuria noted on UA- will send for culture to check for UTI.  Plan: Problem List Items Addressed This Visit   None We will make a referral to [facility] gynecology. She is educated on treatment options for her mixed incontinence, including pelvic floor PT, mesh placement as  well as medication for her urge incontinence.  I have described stress incontinence and urge incontinence to her. She does inquire about having the perineal mole removed.We will make an appt with one of the gyn providers to remove this in a few weeks.  Also strongly recommended she quit smoking in order to improve her respiratory health, improve any post surgical healing.  Mirna Mires, CNM  12/31/2022 5:38 PM

## 2023-01-05 ENCOUNTER — Ambulatory Visit: Payer: 59 | Admitting: Physician Assistant

## 2023-01-05 DIAGNOSIS — G9050 Complex regional pain syndrome I, unspecified: Secondary | ICD-10-CM | POA: Diagnosis not present

## 2023-01-08 ENCOUNTER — Ambulatory Visit: Payer: 59 | Admitting: Physician Assistant

## 2023-01-12 ENCOUNTER — Ambulatory Visit (INDEPENDENT_AMBULATORY_CARE_PROVIDER_SITE_OTHER): Payer: 59 | Admitting: Physician Assistant

## 2023-01-12 ENCOUNTER — Encounter: Payer: Self-pay | Admitting: Physician Assistant

## 2023-01-12 VITALS — BP 94/66 | HR 70 | Ht 61.0 in | Wt 186.8 lb

## 2023-01-12 DIAGNOSIS — Z6835 Body mass index (BMI) 35.0-35.9, adult: Secondary | ICD-10-CM

## 2023-01-12 DIAGNOSIS — R7309 Other abnormal glucose: Secondary | ICD-10-CM

## 2023-01-12 DIAGNOSIS — I700 Atherosclerosis of aorta: Secondary | ICD-10-CM | POA: Diagnosis not present

## 2023-01-12 DIAGNOSIS — J439 Emphysema, unspecified: Secondary | ICD-10-CM | POA: Diagnosis not present

## 2023-01-12 DIAGNOSIS — I25118 Atherosclerotic heart disease of native coronary artery with other forms of angina pectoris: Secondary | ICD-10-CM | POA: Diagnosis not present

## 2023-01-12 DIAGNOSIS — E559 Vitamin D deficiency, unspecified: Secondary | ICD-10-CM

## 2023-01-12 DIAGNOSIS — E039 Hypothyroidism, unspecified: Secondary | ICD-10-CM

## 2023-01-12 MED ORDER — LIRAGLUTIDE 18 MG/3ML ~~LOC~~ SOPN
1.2000 mg | PEN_INJECTOR | Freq: Every day | SUBCUTANEOUS | 1 refills | Status: DC
Start: 2023-01-12 — End: 2023-01-12

## 2023-01-12 MED ORDER — LIRAGLUTIDE 18 MG/3ML ~~LOC~~ SOPN
0.6000 mg | PEN_INJECTOR | Freq: Every day | SUBCUTANEOUS | 0 refills | Status: DC
Start: 2023-01-12 — End: 2023-01-23

## 2023-01-12 NOTE — Assessment & Plan Note (Signed)
Chronic, historic condition Appears controlled on current regimen comprised of Atorvastatin 80 mg PO every day  Continue current regimen Recheck lipid panel today- results to dictate further management Follow up in 6 months or sooner if concerns arise

## 2023-01-12 NOTE — Progress Notes (Signed)
Established Patient Office Visit  Name: Janet Murray   MRN: [id number]    DOB: Sep 23, 1970   Date:01/12/2023  Today's Provider: Jacquelin Hawking, MHS, PA-C Introduced myself to the patient as a PA-C and provided education on APPs in clinical practice.         Subjective  Chief Complaint  Chief Complaint  Patient presents with   Hyperlipidemia   Hypertension    HPI   COPD  COPD status: stable Satisfied with current treatment?: yes Oxygen use: no Dyspnea frequency: not often  Cough frequency: very infrequent  Rescue inhaler frequency:  about 1-2 times per week  Limitation of activity:  does not feel limited very much  Productive cough:  Last Spirometry:  Pneumovax: unknown Influenza: Up to Date  HYPERLIPIDEMIA/ CAD Hyperlipidemia status: good compliance Satisfied with current treatment?  yes Side effects:  no Medication compliance: good compliance Past cholesterol meds: atorvastain (lipitor) Supplements: none Aspirin:  no The 10-year ASCVD risk score (Arnett DK, et al., 2019) is: 4.6%   Values used to calculate the score:     Age: 69 years     Sex: Female     Is Non-Hispanic African American: No     Diabetic: Yes     Tobacco smoker: Yes     Systolic Blood Pressure: 94 mmHg     Is BP treated: No     HDL Cholesterol: 56 mg/dL     Total Cholesterol: 220 mg/dL Chest pain:  no Coronary artery disease:  yes  HYPOTHYROIDISM Thyroid control status:stable Satisfied with current treatment? 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) 03/27/2022   Neuroforaminal stenosis of lumbar spine (L4/5) 03/27/2022   Chronic pain syndrome 03/27/2022   History of colonic polyps    Atherosclerosis of aorta (HCC) 09/27/2021   Hypocalcemia 07/31/2021   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   High cholesterol 01/09/2021   Coronary artery disease 11/09/2020   Special screening for malignant neoplasms, colon    Polyp of colon    Chronic right SI joint pain 04/02/2020   Trochanteric bursitis of right hip 04/02/2020   COPD (chronic obstructive pulmonary disease) with emphysema (HCC) 12/13/2019   Tobacco abuse 12/13/2019   Acquired hypothyroidism 11/01/2019   Panic attack 11/01/2019   Urinary incontinence    Obesity 10/26/2019   Diarrhea of infectious origin 03/25/2018   Fatty liver 03/25/2018   History of alcohol use 03/25/2018   Cyst of right kidney 03/22/2018   Chronic nausea 02/19/2018   Thrombocytopenia (HCC) 02/19/2018   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   Abdominal pain, RUQ (right upper quadrant) 10/10/2017   Other dysphagia 04/20/2017   Other irritable bowel syndrome 04/20/2017   Migraine 02/04/2016   Lumbar radiculopathy 11/08/2014   Radiculopathy of lumbar region 11/08/2014   Hereditary and idiopathic neuropathy 11/06/2014   Osteoarthritis of spine with radiculopathy, lumbar region 10/25/2014   Lumbar facet arthropathy 10/25/2014   Sacroiliac joint dysfunction  of both sides 10/25/2014   Carpal tunnel syndrome of left wrist 10/25/2014   Occipital neuralgia 10/25/2014   Restless leg syndrome 10/25/2014   Other intervertebral disc degeneration, lumbar region 10/25/2014   Lumbar spondylosis 10/25/2014   Low back pain 10/25/2014   Sacrococcygeal disorders, not elsewhere classified 10/25/2014    Past Surgical History:  Procedure Laterality  Date   ABDOMINAL HYSTERECTOMY     APPENDECTOMY     BLADDER SURGERY     sling - Dr. Barnabas Lister   CESAREAN SECTION     COLONOSCOPY WITH PROPOFOL N/A 04/11/2020   Procedure: COLONOSCOPY WITH PROPOFOL;  Surgeon: Pasty Spillers, MD;  Location: [facility] ENDOSCOPY;  Service: Endoscopy;  Laterality: N/A;   COLONOSCOPY WITH PROPOFOL N/A 10/22/2021   Procedure: COLONOSCOPY WITH PROPOFOL;  Surgeon: Toney Reil, MD;  Location: [facility]COPY;  Service: Gastroenterology;  Laterality: N/A;   ESOPHAGOGASTRODUODENOSCOPY  04/26/2019   STOMACH SURGERY     THYROIDECTOMY N/A 04/12/2021   Procedure: THYROIDECTOMY, total;  Surgeon: Leafy Ro, MD;  Location: [facility] ORS;  Service: General;  Laterality: N/A;   TOTAL ABDOMINAL HYSTERECTOMY W/ BILATERAL SALPINGOOPHORECTOMY      Family History  Problem Relation Age of Onset   Neuropathy Mother    Atrial fibrillation Mother    Arthritis Father    Hypertension Father    Leukemia Father    Heart disease Father        CAD s/p CABG   Healthy Brother    Healthy Brother    Healthy Brother    Dementia Maternal Grandmother    Hypertension Maternal Grandmother    Hypercholesterolemia Maternal Grandmother    Heart disease Maternal Grandmother    Healthy Daughter    Healthy Son    Stroke Neg Hx    Cancer Neg Hx    Breast cancer Neg Hx     Social History   Tobacco Use   Smoking status: Every Day    Current packs/day: 0.50    Average packs/day: 0.5 packs/day for 25.0 years (12.5 ttl pk-yrs)    Types: Cigarettes   Smokeless tobacco: Never   Tobacco comments:    0.5PPD 11/11/2022 khj  Substance Use Topics   Alcohol use: Not Currently    Comment: occassionally     Current Outpatient Medications:    albuterol (PROVENTIL) (2.5 MG/3ML) 0.083% nebulizer solution, Take 3 mLs (2.5 mg total) by nebulization every 6 (six) hours as needed for wheezing or shortness of breath., Disp: 75 mL, Rfl: 12   albuterol (VENTOLIN HFA) 108 (90 Base) MCG/ACT  inhaler, Inhale 2 puffs into the lungs every 6 (six) hours as needed for wheezing or shortness of breath., Disp: 8.5 g, Rfl: 4   AMBULATORY NON FORMULARY MEDICATION, Medication Name: nebulizer DX: J44.9, Disp: 1 each, Rfl: 0   atorvastatin (LIPITOR) 80 MG tablet, TAKE 1 TABLET BY MOUTH DAILY, Disp: 90 tablet, Rfl: 3   diclofenac Sodium (VOLTAREN) 1 % GEL, Apply 1 application topically daily as needed (Knee pain)., Disp: , Rfl:    esomeprazole (NEXIUM) 40 MG capsule, TAKE 1 CAPSULE BY MOUTH TWICE DAILY, Disp: 180 capsule, Rfl: 0   fluticasone (FLONASE) 50 MCG/ACT nasal spray, Place 2 sprays into both nostrils daily., Disp: 16 g, Rfl: 6   Glycopyrrolate-Formoterol (BEVESPI AEROSPHERE) 9-4.8 MCG/ACT AERO, 2 puffs twice a day, Disp: 10.7 g, Rfl: 11   hydrOXYzine (ATARAX/VISTARIL) 25 MG tablet, Take 1 tablet (25 mg total) by mouth 3 (three) times daily as needed. (Patient taking differently: Take  25 mg by mouth 3 (three) times daily as needed for anxiety.), Disp: 30 tablet, Rfl: 5   levothyroxine (SYNTHROID) 175 MCG tablet, Take 1 tablet (175 mcg total) by mouth daily., Disp: 90 tablet, Rfl: 2   mometasone (ELOCON) 0.1 % lotion, External for 30 Days, Disp: , Rfl:    montelukast (SINGULAIR) 10 MG tablet, TAKE 1 TABLET BY MOUTH AT BEDTIME, Disp: 90 tablet, Rfl: 2   naloxone (NARCAN) nasal spray 4 mg/0.1 mL, SMARTSIG:Both Nares, Disp: , Rfl:    ondansetron (ZOFRAN-ODT) 4 MG disintegrating tablet, TAKE 1 TABLET BY MOUTH EVERY 8 HOURS AS NEEDED, Disp: 30 tablet, Rfl: 0   oxyCODONE (OXY IR/ROXICODONE) 5 MG immediate release tablet, Take 1 tablet (5 mg total) by mouth every 4 (four) hours as needed for severe pain., Disp: 20 tablet, Rfl: 0   pramipexole (MIRAPEX) 0.5 MG tablet, TAKE 1 TABLET BY MOUTH 3 TIMES DAILY, Disp: 270 tablet, Rfl: 0   tiZANidine (ZANAFLEX) 2 MG tablet, Take 2 mg by mouth 2 (two) times daily., Disp: , Rfl:    triamcinolone cream (KENALOG) 0.1 %, Apply 1 Application topically 2 (two) times  daily., Disp: 30 g, Rfl: 0   Vitamin D, Ergocalciferol, (DRISDOL) 1.25 MG (50000 UNIT) CAPS capsule, Take 1 capsule (50,000 Units total) by mouth every 7 (seven) days., Disp: 12 capsule, Rfl: 1   liraglutide (VICTOZA) 18 MG/3ML SOPN, Inject 0.6 mg into the skin daily., Disp: 6 mL, Rfl: 0   metoprolol tartrate (LOPRESSOR) 100 MG tablet, Take 1 tablet (100 mg total) by mouth once for 1 dose. TWO HOURS PRIOR TO CARDIAC CTA, Disp: 1 tablet, Rfl: 0  Allergies  Allergen Reactions   Penicillins Hives and Swelling    Has patient had a PCN reaction causing immediate rash, facial/tongue/throat swelling, SOB or lightheadedness with hypotension: Yes Has patient had a PCN reaction causing severe rash involving mucus membranes or skin necrosis: No Has patient had a PCN reaction that required hospitalization No Has patient had a PCN reaction occurring within the last 10 years: No If all of the above answers are "NO", then may proceed with Cephalosporin use.   Acetaminophen     Due to polyps    Ciprofloxacin Itching   Aspirin Palpitations    Heart palpitations   Hydrocodone Hives and Rash   Tape Rash    Per patient "clear tape"    I personally reviewed active problem list, medication list, allergies, health maintenance, notes from last encounter, lab results with the patient/caregiver today.   ROS  See HPI above for related ROS   Objective  Vitals:   01/12/23 1313  BP: 94/66  Pulse: 70  SpO2: 96%  Weight: 186 lb 12.8 oz (84.7 kg)  Height: 5\' 1"  (1.549 m)    Body mass index is 35.3 kg/m.  Physical Exam Vitals reviewed.  Constitutional:      General: She is awake.     Appearance: Normal appearance. She is well-developed and well-groomed.  HENT:     Head: Normocephalic and atraumatic.  Eyes:     General: Lids are normal. Gaze aligned appropriately.  Cardiovascular:     Rate and Rhythm: Normal rate and regular rhythm.     Pulses: Normal pulses.          Radial pulses are 2+ on the  right side and 2+ on the left side.     Heart sounds: Normal heart sounds. No murmur heard.    No friction rub. No gallop.  Pulmonary:  Effort: Pulmonary effort is normal.     Breath sounds: Normal breath sounds. No decreased air movement. No decreased breath sounds, wheezing, rhonchi or rales.  Musculoskeletal:     Cervical back: Normal range of motion and neck supple.     Right lower leg: No edema.     Left lower leg: No edema.  Neurological:     Mental Status: She is alert.  Psychiatric:        Attention and Perception: Attention and perception normal.        Mood and Affect: Mood and affect normal.        Speech: Speech normal.        Behavior: Behavior normal. Behavior is cooperative.      Recent Results (from the past 2160 hour(s))  ECHOCARDIOGRAM COMPLETE     Status: None   Collection Time: 12/15/22  2:31 PM  Result Value Ref Range   AR max vel 2.69 cm2   AV Peak grad 6.1 mmHg   Ao pk vel 1.23 m/s   S' Lateral 3.85 cm   Area-P 1/2 3.21 cm2   AV Area VTI 2.96 cm2   AV Mean grad 3.0 mmHg   Single Plane A4C EF 56.6 %   Single Plane A2C EF 57.4 %   Calc EF 56.8 %   AV Area mean vel 2.61 cm2   Est EF 55 - 60%   Urinalysis, Routine w reflex microscopic     Status: Abnormal   Collection Time: 12/16/22  1:31 PM  Result Value Ref Range   Specific Gravity, UA 1.020 1.005 - 1.030   pH, UA 7.0 5.0 - 7.5   Color, UA Yellow Yellow   Appearance Ur Clear Clear   Leukocytes,UA Negative Negative   Protein,UA Trace (A) Negative/Trace   Glucose, UA Negative Negative   Ketones, UA Negative Negative   RBC, UA 2+ (A) Negative   Bilirubin, UA Negative Negative   Urobilinogen, Ur 1.0 0.2 - 1.0 mg/dL   Nitrite, UA Negative Negative   Microscopic Examination See below:   WET PREP FOR TRICH, YEAST, CLUE     Status: Abnormal   Collection Time: 12/16/22  1:31 PM   Specimen: Urine   Urine  Result Value Ref Range   Trichomonas Exam Negative Negative   Yeast Exam Negative Negative    Clue Cell Exam Positive (A) Negative  Microscopic Examination     Status: Abnormal   Collection Time: 12/16/22  1:31 PM   Urine  Result Value Ref Range   WBC, UA 0-5 0 - 5 /hpf   RBC, Urine 3-10 (A) 0 - 2 /hpf   Epithelial Cells (non renal) 0-10 0 - 10 /hpf   Mucus, UA Present (A) Not Estab.   Bacteria, UA None seen None seen/Few  Urine Culture     Status: None   Collection Time: 12/16/22  5:00 PM   Specimen: Urine   UR  Result Value Ref Range   Urine Culture, Routine Final report    Organism ID, Bacteria Comment     Comment: Mixed urogenital flora 10,000-25,000 colony forming units per mL   POCT Urinalysis Dipstick     Status: Abnormal   Collection Time: 12/31/22  3:33 PM  Result Value Ref Range   Color, UA     Clarity, UA     Glucose, UA Negative Negative   Bilirubin, UA neg    Ketones, UA neg    Spec Grav, UA     Blood,  UA 3+    pH, UA 5.0 5.0 - 8.0   Protein, UA Positive (A) Negative   Urobilinogen, UA 0.2 0.2 or 1.0 E.U./dL   Nitrite, UA neg    Leukocytes, UA Trace (A) Negative   Appearance     Odor    Urine Culture     Status: None   Collection Time: 12/31/22  3:34 PM   Specimen: Urine, Clean Catch   UR  Result Value Ref Range   Urine Culture, Routine Final report    Organism ID, Bacteria Comment     Comment: Mixed urogenital flora 10,000-25,000 colony forming units per mL      PHQ2/9:    01/12/2023    1:17 PM 12/16/2022    1:12 PM 10/27/2022    8:59 AM 10/06/2022    1:53 PM 08/22/2022    4:38 PM  Depression screen PHQ 2/9  Decreased Interest 1 1 0 0 1  Down, Depressed, Hopeless 0 0 0 0 1  PHQ - 2 Score 1 1 0 0 2  Altered sleeping 1 1   0  Tired, decreased energy 1 1   1   Change in appetite 0 0   1  Feeling bad or failure about yourself  0 0   0  Trouble concentrating 0 0   0  Moving slowly or fidgety/restless 0 0   0  Suicidal thoughts 0 0   0  PHQ-9 Score 3 3   4   Difficult doing work/chores Not difficult at all Not difficult at all   Not  difficult at all      Fall Risk:    01/12/2023    1:16 PM 12/29/2022    2:12 PM 12/16/2022    1:11 PM 10/27/2022    8:59 AM 10/06/2022    1:53 PM  Fall Risk   Falls in the past year? 0 0 0 0 0  Number falls in past yr: 0  0    Injury with Fall? 0  0    Risk for fall due to : No Fall Risks  No Fall Risks    Follow up Falls evaluation completed          Functional Status Survey:      Assessment & Plan  Problem List Items Addressed This Visit       Cardiovascular and Mediastinum   Coronary artery disease    Chronic, historic condition Appears controlled on current regimen comprised of Atorvastatin 80 mg PO every day  Continue current regimen Recheck lipid panel today- results to dictate further management Follow up in 6 months or sooner if concerns arise        Relevant Orders   Lipid Profile   Atherosclerosis of aorta (HCC)    Chronic, historic condition Appears controlled on current regimen comprised of Atorvastatin 80 mg PO every day  Continue current regimen Recheck lipid panel today- results to dictate further management Follow up in 6 months or sooner if concerns arise       Relevant Orders   Lipid Profile     Respiratory   COPD (chronic obstructive pulmonary disease) with emphysema (HCC) - Primary    Chronic, historic condition She reports some issues today with allergies but states she is not coughing much and has rare need of rescue therapies  Recommend she continue her current regimen Continue regular follow up with Pulmonology for monitoring  Follow up in 6 months or sooner if concerns arise  Relevant Orders   Comp Met (CMET)   CBC w/Diff     Endocrine   Acquired hypothyroidism    Chronic, historic condition She is currently taking Levothyroxine 175 mcg PO every day and appears to be tolerating well  Recheck thyroid panel today- results to dictate further management  Continue current regimen pending labs Follow up in 6 months or sooner  if concerns arise        Relevant Orders   Thyroid Panel With TSH     Other   Obesity    Chronic, ongoing Patient reports she would like to continue with Victoza but has not been taking for some time Will provide refills for 0.6 mg injection daily for now Recommend follow up in about 6 weeks to discuss response and for further management        Relevant Medications   liraglutide (VICTOZA) 18 MG/3ML SOPN   Other Visit Diagnoses     Vitamin D deficiency       Relevant Orders   Vitamin D (25 hydroxy)   Elevated glucose level       Relevant Orders   HgB A1c        Return in about 6 months (around 07/15/2023) for copd, thyroid, vitamin d.   I,  E , PA-C, have reviewed all documentation for this visit. The documentation on 01/12/23 for the exam, diagnosis, procedures, and orders are all accurate and complete.   Jacquelin Hawking, MHS, PA-C [facility]

## 2023-01-12 NOTE — Assessment & Plan Note (Signed)
Chronic, historic condition She is currently taking Levothyroxine 175 mcg PO every day and appears to be tolerating well  Recheck thyroid panel today- results to dictate further management  Continue current regimen pending labs Follow up in 6 months or sooner if concerns arise

## 2023-01-12 NOTE — Assessment & Plan Note (Signed)
Chronic, historic condition She reports some issues today with allergies but states she is not coughing much and has rare need of rescue therapies  Recommend she continue her current regimen Continue regular follow up with Pulmonology for monitoring  Follow up in 6 months or sooner if concerns arise

## 2023-01-12 NOTE — Assessment & Plan Note (Signed)
Chronic, ongoing Patient reports she would like to continue with Victoza but has not been taking for some time Will provide refills for 0.6 mg injection daily for now Recommend follow up in about 6 weeks to discuss response and for further management

## 2023-01-13 ENCOUNTER — Telehealth (HOSPITAL_COMMUNITY): Payer: Self-pay | Admitting: *Deleted

## 2023-01-13 LAB — THYROID PANEL WITH TSH

## 2023-01-13 LAB — SPECIMEN STATUS REPORT

## 2023-01-13 NOTE — Telephone Encounter (Signed)
Attempted to call patient regarding upcoming cardiac CT appointment. Left message on voicemail with name and callback number Janet Frame RN Navigator Cardiac Imaging [facility] [phone] Office   Mychart message sent with instructions

## 2023-01-14 ENCOUNTER — Ambulatory Visit
Admission: RE | Admit: 2023-01-14 | Discharge: 2023-01-14 | Disposition: A | Discharge status: Home / Self Care | Payer: 59 | Source: Ambulatory Visit | Attending: Cardiology | Admitting: Cardiology

## 2023-01-14 DIAGNOSIS — R079 Chest pain, unspecified: Secondary | ICD-10-CM | POA: Insufficient documentation

## 2023-01-14 DIAGNOSIS — I2510 Atherosclerotic heart disease of native coronary artery without angina pectoris: Secondary | ICD-10-CM | POA: Diagnosis not present

## 2023-01-14 MED ORDER — METOPROLOL TARTRATE 5 MG/5ML IV SOLN
INTRAVENOUS | Status: AC
  Filled 2023-01-14: qty 10

## 2023-01-14 MED ORDER — METOPROLOL TARTRATE 5 MG/5ML IV SOLN
10.0000 mg | Freq: Once | INTRAVENOUS | Status: AC
  Administered 2023-01-14: 10 mg via INTRAVENOUS
  Filled 2023-01-14: qty 10

## 2023-01-14 MED ORDER — IOHEXOL 350 MG/ML SOLN
80.0000 mL | Freq: Once | INTRAVENOUS | Status: AC | PRN
  Administered 2023-01-14: 80 mL via INTRAVENOUS

## 2023-01-14 MED ORDER — NITROGLYCERIN 0.4 MG SL SUBL
0.8000 mg | SUBLINGUAL_TABLET | Freq: Once | SUBLINGUAL | Status: AC
  Administered 2023-01-14: 0.8 mg via SUBLINGUAL
  Filled 2023-01-14: qty 25

## 2023-01-14 NOTE — Progress Notes (Signed)

## 2023-01-15 ENCOUNTER — Ambulatory Visit: Payer: 59 | Admitting: Obstetrics and Gynecology

## 2023-01-15 ENCOUNTER — Ambulatory Visit: Payer: 59 | Admitting: Oncology

## 2023-01-15 ENCOUNTER — Other Ambulatory Visit: Payer: 59

## 2023-01-16 MED ORDER — VITAMIN D (ERGOCALCIFEROL) 1.25 MG (50000 UNIT) PO CAPS
50000.0000 [IU] | ORAL_CAPSULE | ORAL | 0 refills | Status: DC
Start: 2023-01-16 — End: 2023-09-15

## 2023-01-16 NOTE — Progress Notes (Signed)
Your labs are back  Your A1c, electrolytes, liver and kidney function are in normal limits Your CBC is overall normal/stable Your Vitamin D is low but improved since last year. I have sent in a script for a supplement for you to take once per week to assist with improving this. We can recheck this in about 3 months to monitor.  Your Cholesterol is a bit high but is improved since it was checked 6 months ago. Please continue the good work.  Your thyroid stimulating hormone was a bit high but your thyroid hormones were in normal ranges.  I think we should recheck this in about 3 months and we can make medication adjustments as needed at that time.

## 2023-01-16 NOTE — Addendum Note (Signed)
Addended by: Jacquelin Hawking on: 01/16/2023 04:40 PM   Modules accepted: Orders

## 2023-01-19 ENCOUNTER — Encounter: Payer: Self-pay | Admitting: Student in an Organized Health Care Education/Training Program

## 2023-01-19 ENCOUNTER — Ambulatory Visit
Admission: RE | Admit: 2023-01-19 | Discharge: 2023-01-19 | Disposition: A | Discharge status: Home / Self Care | Payer: 59 | Source: Ambulatory Visit | Attending: Student in an Organized Health Care Education/Training Program | Admitting: Student in an Organized Health Care Education/Training Program

## 2023-01-19 ENCOUNTER — Ambulatory Visit
Payer: 59 | Attending: Student in an Organized Health Care Education/Training Program | Admitting: Student in an Organized Health Care Education/Training Program

## 2023-01-19 VITALS — BP 109/73 | HR 67 | Temp 96.6°F | Resp 17 | Ht 61.0 in | Wt 183.0 lb

## 2023-01-19 DIAGNOSIS — G894 Chronic pain syndrome: Secondary | ICD-10-CM | POA: Diagnosis not present

## 2023-01-19 DIAGNOSIS — M48061 Spinal stenosis, lumbar region without neurogenic claudication: Secondary | ICD-10-CM | POA: Insufficient documentation

## 2023-01-19 DIAGNOSIS — M48062 Spinal stenosis, lumbar region with neurogenic claudication: Secondary | ICD-10-CM | POA: Diagnosis not present

## 2023-01-19 MED ORDER — ROPIVACAINE HCL 2 MG/ML IJ SOLN
2.0000 mL | Freq: Once | INTRAMUSCULAR | Status: AC
  Administered 2023-01-19: 2 mL via EPIDURAL
  Filled 2023-01-19: qty 20

## 2023-01-19 MED ORDER — LIDOCAINE HCL 2 % IJ SOLN
20.0000 mL | Freq: Once | INTRAMUSCULAR | Status: AC
  Administered 2023-01-19: 100 mg
  Filled 2023-01-19: qty 40

## 2023-01-19 MED ORDER — SODIUM CHLORIDE 0.9% FLUSH
2.0000 mL | Freq: Once | INTRAVENOUS | Status: AC
  Administered 2023-01-19: 2 mL

## 2023-01-19 MED ORDER — DEXAMETHASONE SODIUM PHOSPHATE 10 MG/ML IJ SOLN
10.0000 mg | Freq: Once | INTRAMUSCULAR | Status: AC
  Administered 2023-01-19: 10 mg
  Filled 2023-01-19: qty 1

## 2023-01-19 MED ORDER — IOHEXOL 180 MG/ML  SOLN
10.0000 mL | Freq: Once | INTRAMUSCULAR | Status: AC
  Administered 2023-01-19: 10 mL via EPIDURAL
  Filled 2023-01-19: qty 20

## 2023-01-19 NOTE — Progress Notes (Signed)
PROVIDER NOTE: Interpretation of information contained herein should be left to medically-trained personnel. Specific patient instructions are provided elsewhere under "Patient Instructions" section of medical record. This document was created in part using STT-dictation technology, any transcriptional errors that may result from this process are unintentional.  Patient: Janet Murray Type: Established DOB: 11-08-1970 MRN: [id number] PCP: Larae Grooms, NP  Service: Procedure DOS: 01/19/2023 Setting: Ambulatory Location: Ambulatory outpatient facility Delivery: Face-to-face Provider: Edward Jolly, MD Specialty: Interventional Pain Management Specialty designation: 09 Location: Outpatient facility Ref. Prov.: Larae Grooms, NP       Interventional Therapy   Procedure: Lumbar epidural steroid injection (LESI) (interlaminar) #1    Laterality: Right   Level:  L4-5 Level.  Imaging: Fluoroscopic guidance         Anesthesia: Local anesthesia (1-2% Lidocaine) DOS: 01/19/2023  Performed by: Edward Jolly, MD  Purpose: Diagnostic/Therapeutic Indications: Lumbar radicular pain of intraspinal etiology of more than 4 weeks that has failed to respond to conservative therapy and is severe enough to impact quality of life or function. 1. 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 Chronic pain syndrome    NAS-11 Pain score:   Pre-procedure: 8 /10   Post-procedure: 0-No pain/10      Position / Prep / Materials:  Position: Prone w/ head of the table raised (slight reverse trendelenburg) to facilitate breathing.  Prep solution: DuraPrep (Iodine Povacrylex [0.7% available iodine] and Isopropyl Alcohol, 74% w/w) Prep Area: Entire Posterior Lumbar Region from lower scapular tip down to mid buttocks area and from flank to flank. Materials:  Tray: Epidural tray Needle(s):  Type: Epidural needle (Tuohy) Gauge  (G):  22 Length: Regular (3.5-in) Qty: 1   H&P (Pre-op Assessment):  Janet Murray is a 52 y.o. (year old), female patient, seen today for interventional treatment. She  has a past surgical history that includes Bladder surgery; Cesarean section; Appendectomy; Total abdominal hysterectomy w/ bilateral salpingoophorectomy; Esophagogastroduodenoscopy (04/26/2019); Stomach surgery; Colonoscopy with propofol (N/A, 04/11/2020); Thyroidectomy (N/A, 04/12/2021); Colonoscopy with propofol (N/A, 10/22/2021); and Abdominal hysterectomy. Janet Murray has a current medication list which includes the following prescription(s): albuterol, albuterol, AMBULATORY NON FORMULARY MEDICATION, atorvastatin, diclofenac sodium, esomeprazole, fluticasone, bevespi aerosphere, hydroxyzine, levothyroxine, liraglutide, metoprolol tartrate, mometasone, montelukast, naloxone, ondansetron, oxycodone, pramipexole, tizanidine, triamcinolone cream, and vitamin d (ergocalciferol). Her primarily concern today is the Back Pain  Initial Vital Signs:  Pulse/HCG Rate: 67ECG Heart Rate: 72 Temp: (!) 96.6 F (35.9 C) Resp: 17 BP: 114/77 SpO2: 99 %  BMI: Estimated body mass index is 34.58 kg/m as calculated from the following:   Height as of this encounter: 5\' 1"  (1.549 m).   Weight as of this encounter: 183 lb (83 kg).  Risk Assessment: Allergies: Reviewed. She is allergic to penicillins, acetaminophen, ciprofloxacin, aspirin, hydrocodone, and tape.  Allergy Precautions: None required Coagulopathies: Reviewed. None identified.  Blood-thinner therapy: None at this time Active Infection(s): Reviewed. None identified. Janet Murray is afebrile  Site Confirmation: Janet Murray was asked to confirm the procedure and laterality before marking the site Procedure checklist: Completed Consent: Before the procedure and under the influence of no sedative(s), amnesic(s), or anxiolytics, the patient was informed of the treatment options, risks and  possible complications. To fulfill our ethical and legal obligations, as recommended by the American Medical Association's Code of Ethics, I have informed the patient of my clinical impression; the nature and purpose of the treatment or procedure; the risks, benefits,  and possible complications of the intervention; the alternatives, including doing nothing; the risk(s) and benefit(s) of the alternative treatment(s) or procedure(s); and the risk(s) and benefit(s) of doing nothing. The patient was provided information about the general risks and possible complications associated with the procedure. These may include, but are not limited to: failure to achieve desired goals, infection, bleeding, organ or nerve damage, allergic reactions, paralysis, and death. In addition, the patient was informed of those risks and complications associated to Spine-related procedures, such as failure to decrease pain; infection (i.e.: Meningitis, epidural or intraspinal abscess); bleeding (i.e.: epidural hematoma, subarachnoid hemorrhage, or any other type of intraspinal or peri-dural bleeding); organ or nerve damage (i.e.: Any type of peripheral nerve, nerve root, or spinal cord injury) with subsequent damage to sensory, motor, and/or autonomic systems, resulting in permanent pain, numbness, and/or weakness of one or several areas of the body; allergic reactions; (i.e.: anaphylactic reaction); and/or death. Furthermore, the patient was informed of those risks and complications associated with the medications. These include, but are not limited to: allergic reactions (i.e.: anaphylactic or anaphylactoid reaction(s)); adrenal axis suppression; blood sugar elevation that in diabetics may result in ketoacidosis or comma; water retention that in patients with history of congestive heart failure may result in shortness of breath, pulmonary edema, and decompensation with resultant heart failure; weight gain; swelling or edema;  medication-induced neural toxicity; particulate matter embolism and blood vessel occlusion with resultant organ, and/or nervous system infarction; and/or aseptic necrosis of one or more joints. Finally, the patient was informed that Medicine is not an exact science; therefore, there is also the possibility of unforeseen or unpredictable risks and/or possible complications that may result in a catastrophic outcome. The patient indicated having understood very clearly. We have given the patient no guarantees and we have made no promises. Enough time was given to the patient to ask questions, all of which were answered to the patient's satisfaction. Ms. Kolesar has indicated that she wanted to continue with the procedure. Attestation: I, the ordering provider, attest that I have discussed with the patient the benefits, risks, side-effects, alternatives, likelihood of achieving goals, and potential problems during recovery for the procedure that I have provided informed consent. Date  Time: 01/19/2023 11:24 AM   Pre-Procedure Preparation:  Monitoring: As per clinic protocol. Respiration, ETCO2, SpO2, BP, heart rate and rhythm monitor placed and checked for adequate function Safety Precautions: Patient was assessed for positional comfort and pressure points before starting the procedure. Time-out: I initiated and conducted the "Time-out" before starting the procedure, as per protocol. The patient was asked to participate by confirming the accuracy of the "Time Out" information. Verification of the correct person, site, and procedure were performed and confirmed by me, the nursing staff, and the patient. "Time-out" conducted as per Joint Commission's Universal Protocol (UP.01.01.01). Time: 1149 Start Time: 1149 hrs.  Description/Narrative of Procedure:          Target: Epidural space via interlaminar opening, initially targeting the lower laminar border of the superior vertebral body. Region:  Lumbar Approach: Percutaneous paravertebral  Rationale (medical necessity): procedure needed and proper for the diagnosis and/or treatment of the patient's medical symptoms and needs. Procedural Technique Safety Precautions: Aspiration looking for blood return was conducted prior to all injections. At no point did we inject any substances, as a needle was being advanced. No attempts were made at seeking any paresthesias. Safe injection practices and needle disposal techniques used. Medications properly checked for expiration dates. SDV (single dose vial) medications  used. Description of the Procedure: Protocol guidelines were followed. The procedure needle was introduced through the skin, ipsilateral to the reported pain, and advanced to the target area. Bone was contacted and the needle walked caudad, until the lamina was cleared. The epidural space was identified using "loss-of-resistance technique" with 2-3 ml of PF-NaCl (0.9% NSS), in a 5cc LOR glass syringe.  Vitals:   01/19/23 1135 01/19/23 1146 01/19/23 1151  BP: 114/77 111/83 109/73  Pulse: 67    Resp: 17 17 17   Temp: (!) 96.6 F (35.9 C)    SpO2: 99% 99% 99%  Weight: 183 lb (83 kg)    Height: 5\' 1"  (1.549 m)      Start Time: 1149 hrs. End Time: 1151 hrs.  Imaging Guidance (Spinal):          Type of Imaging Technique: Fluoroscopy Guidance (Spinal) Indication(s): Assistance in needle guidance and placement for procedures requiring needle placement in or near specific anatomical locations not easily accessible without such assistance. Exposure Time: Please see nurses notes. Contrast: Before injecting any contrast, we confirmed that the patient did not have an allergy to iodine, shellfish, or radiological contrast. Once satisfactory needle placement was completed at the desired level, radiological contrast was injected. Contrast injected under live fluoroscopy. No contrast complications. See chart for type and volume of contrast  used. Fluoroscopic Guidance: I was personally present during the use of fluoroscopy. "Tunnel Vision Technique" used to obtain the best possible view of the target area. Parallax error corrected before commencing the procedure. "Direction-depth-direction" technique used to introduce the needle under continuous pulsed fluoroscopy. Once target was reached, antero-posterior, oblique, and lateral fluoroscopic projection used confirm needle placement in all planes. Images permanently stored in EMR. Interpretation: I personally interpreted the imaging intraoperatively. Adequate needle placement confirmed in multiple planes. Appropriate spread of contrast into desired area was observed. No evidence of afferent or efferent intravascular uptake. No intrathecal or subarachnoid spread observed. Permanent images saved into the patient's record.  Antibiotic Prophylaxis:   Anti-infectives (From admission, onward)    None      Indication(s): None identified  Post-operative Assessment:  Post-procedure Vital Signs:  Pulse/HCG Rate: 6764 Temp: (!) 96.6 F (35.9 C) Resp: 17 BP: 109/73 SpO2: 99 %  EBL: None  Complications: No immediate post-treatment complications observed by team, or reported by patient.  Note: The patient tolerated the entire procedure well. A repeat set of vitals were taken after the procedure and the patient was kept under observation following institutional policy, for this type of procedure. Post-procedural neurological assessment was performed, showing return to baseline, prior to discharge. The patient was provided with post-procedure discharge instructions, including a section on how to identify potential problems. Should any problems arise concerning this procedure, the patient was given instructions to immediately contact us, at any time, without hesitation. In any case, we plan to contact the patient by telephone for a follow-up status report regarding this interventional  procedure.  Comments:  No additional relevant information.  Plan of Care (POC)  Orders:  Orders Placed This Encounter  Procedures   DG PAIN CLINIC C-ARM 1-60 MIN NO REPORT    Intraoperative interpretation by procedural physician at [facility].    Standing Status:   Standing    Number of Occurrences:   1    Order Specific Question:   Reason for exam:    Answer:   Assistance in needle guidance and placement for procedures requiring needle placement in or near specific anatomical locations  not easily accessible without such assistance.    Medications ordered for procedure: Meds ordered this encounter  Medications   iohexol (OMNIPAQUE) 180 MG/ML injection 10 mL    Must be Myelogram-compatible. If not available, you may substitute with a water-soluble, non-ionic, hypoallergenic, myelogram-compatible radiological contrast medium.   lidocaine (XYLOCAINE) 2 % (with pres) injection 400 mg   ropivacaine (PF) 2 mg/mL (0.2%) (NAROPIN) injection 2 mL   sodium chloride flush (NS) 0.9 % injection 2 mL   dexamethasone (DECADRON) injection 10 mg   Medications administered: We administered iohexol, lidocaine, ropivacaine (PF) 2 mg/mL (0.2%), sodium chloride flush, and dexamethasone.  See the medical record for exact dosing, route, and time of administration.  Follow-up plan:   Return in about 6 weeks (around 03/02/2023) for Post Procedure Evaluation, virtual.       Right L4/5 ESI 04/23/22: 20-25% pain relief for less than a week; right L3, L4, L5 medial branch nerve block 07/09/2022, 09/08/22, Right L3,4,5 RFA 10/27/22  Right SI-J/piriformis, right hip bursa injection         Recent Visits Date Type Provider Dept  12/29/22 Office Visit Edward Jolly, MD [facility]  10/27/22 Procedure visit Edward Jolly, MD [facility]  Showing recent visits within past 90 days and meeting all other requirements Today's Visits Date Type Provider Dept  01/19/23 Procedure  visit Edward Jolly, MD [facility]  Showing today's visits and meeting all other requirements Future Appointments Date Type Provider Dept  03/02/23 Appointment Edward Jolly, MD [facility]  Showing future appointments within next 90 days and meeting all other requirements  Disposition: Discharge home  Discharge (Date  Time): 01/19/2023; 1205 hrs.   Primary Care Physician: Larae Grooms, NP Location: [facility] Note by: Edward Jolly, MD (TTS technology used. I apologize for any typographical errors that were not detected and corrected.) Date: 01/19/2023; Time: 1:11 PM  Disclaimer:  Medicine is not an Visual merchandiser. The only guarantee in medicine is that nothing is guaranteed. It is important to note that the decision to proceed with this intervention was based on the information collected from the patient. The Data and conclusions were drawn from the patient's questionnaire, the interview, and the physical examination. Because the information was provided in large part by the patient, it cannot be guaranteed that it has not been purposely or unconsciously manipulated. Every effort has been made to obtain as much relevant data as possible for this evaluation. It is important to note that the conclusions that lead to this procedure are derived in large part from the available data. Always take into account that the treatment will also be dependent on availability of resources and existing treatment guidelines, considered by other Pain Management Practitioners as being common knowledge and practice, at the time of the intervention. For Medico-Legal purposes, it is also important to point out that variation in procedural techniques and pharmacological choices are the acceptable norm. The indications, contraindications, technique, and results of the above procedure should only be interpreted and judged by a Board-Certified Interventional Pain  Specialist with extensive familiarity and expertise in the same exact procedure and technique.

## 2023-01-19 NOTE — Progress Notes (Signed)
Safety precautions to be maintained throughout the outpatient stay will include: orient to surroundings, keep bed in low position, maintain call bell within reach at all times, provide assistance with transfer out of bed and ambulation.  

## 2023-01-19 NOTE — Patient Instructions (Signed)

## 2023-01-20 ENCOUNTER — Other Ambulatory Visit (HOSPITAL_COMMUNITY)
Admission: RE | Admit: 2023-01-20 | Discharge: 2023-01-20 | Disposition: A | Discharge status: Home / Self Care | Payer: 59 | Source: Ambulatory Visit | Attending: Obstetrics & Gynecology | Admitting: Obstetrics & Gynecology

## 2023-01-20 ENCOUNTER — Ambulatory Visit: Payer: 59 | Admitting: Obstetrics & Gynecology

## 2023-01-20 ENCOUNTER — Encounter: Payer: Self-pay | Admitting: Obstetrics & Gynecology

## 2023-01-20 ENCOUNTER — Telehealth: Payer: Self-pay

## 2023-01-20 ENCOUNTER — Telehealth: Payer: Self-pay | Admitting: Nurse Practitioner

## 2023-01-20 VITALS — BP 117/62 | HR 71 | Ht 61.0 in | Wt 187.0 lb

## 2023-01-20 DIAGNOSIS — N9089 Other specified noninflammatory disorders of vulva and perineum: Secondary | ICD-10-CM | POA: Diagnosis present

## 2023-01-20 DIAGNOSIS — L570 Actinic keratosis: Secondary | ICD-10-CM | POA: Diagnosis not present

## 2023-01-20 DIAGNOSIS — L918 Other hypertrophic disorders of the skin: Secondary | ICD-10-CM | POA: Diagnosis not present

## 2023-01-20 NOTE — Telephone Encounter (Signed)
Post procedure follow up.  LM 

## 2023-01-20 NOTE — Telephone Encounter (Signed)
Patient called and stated that she spoke with  her pharmacy and stated they need a pre authorization for medication (liraglutide (VICTOZA) 18 MG/3ML SOPN) or need an alternative medication for patient sent in, pharmacy stated they have been waiting about 4 days, per patient stated pharmacy sent a fax about this, please advise and update patient.  Patients callback # [phone]

## 2023-01-22 ENCOUNTER — Inpatient Hospital Stay: Payer: 59 | Attending: Oncology | Admitting: Oncology

## 2023-01-22 NOTE — Progress Notes (Unsigned)
Cardiology Office Note    Date:  01/26/2023   ID:  Janet Murray, DOB [birth year], MRN [id number]  PCP:  Larae Grooms, NP  Cardiologist:  Debbe Odea, MD  Electrophysiologist:  None   Chief Complaint: Follow up  History of Present Illness:   Janet Murray is a 52 y.o. 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  She was evaluated as a new patient in 10/2022 for chest pain after having been evaluated in the ED in 08/2022 with negative high-sensitivity troponin and EKG showing sinus rhythm with an incomplete RBBB without acute ischemic changes.  At the time of her visit with cardiology she had been without further symptoms of angina or cardiac decompensation.  Echo on 12/15/2022 showed an EF of 55 to 60%, no regional wall motion abnormalities, normal LV diastolic function parameters, normal RV systolic function and ventricular cavity size, no significant valvular abnormalities, and an estimated right atrial pressure of 3 mmHg.  Coronary CTA on 01/14/2023 showed a calcium score of 13.7 which was the 88th percentile.  There was less than 25% stenosis involving the mid LAD.  She comes in doing well from a cardiac perspective and is without symptoms of angina or cardiac decompensation.  No dyspnea, palpitations, dizziness, presyncope, or syncope.  No falls.  No progressive orthopnea.  She has decreased her tobacco use to approximately 1/2 pack/day.  She reports adherence to atorvastatin on a daily basis.  She does not have any acute cardiac concerns at this time.   Labs independently reviewed: 01/2023 - TSH 5.01, total T4 normal, A1c 5.4, Hgb  13.8, PLT 120, BUN 8, serum creatinine 0.6, potassium 4.0, albumin 4.3, AST/ALT normal, TC 202, TG 94, HDL 52, LDL 133  Past Medical History:  Diagnosis Date   Abdominal mass, RUQ (right upper quadrant)    Anxiety    Arthritis    Asthma    Bronchitis    Cancer (HCC) 04/12/2021   Carpal tunnel syndrome of left wrist 10/25/2014   Chronic bronchitis, obstructive 07/20/2017   Chronic pain    Colon cancer (HCC)    COPD (chronic obstructive pulmonary disease) (HCC)    Coronary artery disease    Cyst of right kidney 03/22/2018   DDD (degenerative disc disease), lumbar 10/25/2014   Degenerative joint disease (DJD) of hip    Diarrhea of infectious origin 03/25/2018   Dysphagia    Dyspnea    Emphysema of lung (HCC)    Facet syndrome, lumbar 10/25/2014   Fatty liver 03/25/2018   Foot fracture, left 05/02/2015   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   GERD (gastroesophageal reflux disease)    Hereditary and idiopathic peripheral neuropathy 11/06/2014   Hypothyroidism    IBS (irritable bowel syndrome)    Low back pain    Migraine 02/04/2016   Occipital neuralgia    Osteoarthritis of spine with radiculopathy, lumbar region 10/25/2014   Other intervertebral disc degeneration, lumbar region    Other irritable bowel syndrome 04/20/2017   Other spondylosis with radiculopathy, lumbar region 10/25/2014   RLS (restless legs syndrome)    Sacroiliac joint dysfunction of both sides 10/25/2014   Spondylosis of lumbar spine    Stomach cancer (HCC)  Urinary incontinence     Past Surgical History:  Procedure Laterality Date   ABDOMINAL HYSTERECTOMY     APPENDECTOMY     BLADDER SURGERY     sling - Dr. Barnabas Lister   CESAREAN SECTION     COLONOSCOPY WITH PROPOFOL N/A 04/11/2020   Procedure: COLONOSCOPY WITH PROPOFOL;  Surgeon: Pasty Spillers, MD;  Location: [facility] ENDOSCOPY;  Service: Endoscopy;  Laterality: N/A;   COLONOSCOPY WITH PROPOFOL N/A 10/22/2021   Procedure: COLONOSCOPY WITH PROPOFOL;   Surgeon: Toney Reil, MD;  Location: [facility] ENDOSCOPY;  Service: Gastroenterology;  Laterality: N/A;   ESOPHAGOGASTRODUODENOSCOPY  04/26/2019   STOMACH SURGERY     THYROIDECTOMY N/A 04/12/2021   Procedure: THYROIDECTOMY, total;  Surgeon: Leafy Ro, MD;  Location: [facility] ORS;  Service: General;  Laterality: N/A;   TOTAL ABDOMINAL HYSTERECTOMY W/ BILATERAL SALPINGOOPHORECTOMY      Current Medications: Current Meds  Medication Sig   albuterol (PROVENTIL) (2.5 MG/3ML) 0.083% nebulizer solution Take 3 mLs (2.5 mg total) by nebulization every 6 (six) hours as needed for wheezing or shortness of breath.   albuterol (VENTOLIN HFA) 108 (90 Base) MCG/ACT inhaler Inhale 2 puffs into the lungs every 6 (six) hours as needed for wheezing or shortness of breath.   AMBULATORY NON FORMULARY MEDICATION Medication Name: nebulizer DX: J44.9   atorvastatin (LIPITOR) 80 MG tablet TAKE 1 TABLET BY MOUTH DAILY   diclofenac Sodium (VOLTAREN) 1 % GEL Apply 1 application topically daily as needed (Knee pain).   esomeprazole (NEXIUM) 40 MG capsule TAKE 1 CAPSULE BY MOUTH TWICE DAILY   fluticasone (FLONASE) 50 MCG/ACT nasal spray Place 2 sprays into both nostrils daily.   Glycopyrrolate-Formoterol (BEVESPI AEROSPHERE) 9-4.8 MCG/ACT AERO 2 puffs twice a day   hydrOXYzine (ATARAX/VISTARIL) 25 MG tablet Take 1 tablet (25 mg total) by mouth 3 (three) times daily as needed. (Patient taking differently: Take 25 mg by mouth 3 (three) times daily as needed for anxiety.)   levothyroxine (SYNTHROID) 175 MCG tablet Take 1 tablet (175 mcg total) by mouth daily.   mometasone (ELOCON) 0.1 % lotion External for 30 Days   montelukast (SINGULAIR) 10 MG tablet TAKE 1 TABLET BY MOUTH AT BEDTIME   naloxone (NARCAN) nasal spray 4 mg/0.1 mL SMARTSIG:Both Nares   ondansetron (ZOFRAN-ODT) 4 MG disintegrating tablet TAKE 1 TABLET BY MOUTH EVERY 8 HOURS AS NEEDED   oxyCODONE (OXY IR/ROXICODONE) 5 MG immediate release tablet Take 1  tablet (5 mg total) by mouth every 4 (four) hours as needed for severe pain.   pramipexole (MIRAPEX) 0.5 MG tablet TAKE 1 TABLET BY MOUTH 3 TIMES DAILY   tiZANidine (ZANAFLEX) 2 MG tablet Take 2 mg by mouth 2 (two) times daily.   triamcinolone cream (KENALOG) 0.1 % Apply 1 Application topically 2 (two) times daily.   Vitamin D, Ergocalciferol, (DRISDOL) 1.25 MG (50000 UNIT) CAPS capsule Take 1 capsule (50,000 Units total) by mouth once a week. For 12 weeks. Then start OTC Vitamin D3 2,000 unit daily.    Allergies:   Penicillins, Acetaminophen, Ciprofloxacin, Aspirin, Hydrocodone, and Tape   Social History   Socioeconomic History   Marital status: Single    Spouse name: Not on file   Number of children: 2   Years of education: Not on file   Highest education level: 9th grade  Occupational History   Not on file  Tobacco Use   Smoking status: Every Day    Current packs/day: 0.50    Average packs/day: 0.5 packs/day for 25.0  years (12.5 ttl pk-yrs)    Types: Cigarettes   Smokeless tobacco: Never   Tobacco comments:    0.5PPD 11/11/2022 khj  Vaping Use   Vaping status: Never Used  Substance and Sexual Activity   Alcohol use: Not Currently    Comment: occassionally   Drug use: No   Sexual activity: Not Currently    Birth control/protection: Surgical    Comment: Hysterectomy  Other Topics Concern   Not on file  Social History Narrative   Lives alone   Social Determinants of Health   Financial Resource Strain: Medium Risk (12/16/2022)   Overall Financial Resource Strain (CARDIA)    Difficulty of Paying Living Expenses: Somewhat hard  Food Insecurity: No Food Insecurity (12/16/2022)   Hunger Vital Sign    Worried About Running Out of Food in the Last Year: Never true    Ran Out of Food in the Last Year: Never true  Transportation Needs: No Transportation Needs (12/16/2022)   PRAPARE - Administrator, Civil Service (Medical): No    Lack of Transportation (Non-Medical): No   Physical Activity: Insufficiently Active (12/16/2022)   Exercise Vital Sign    Days of Exercise per Week: 5 days    Minutes of Exercise per Session: 10 min  Stress: No Stress Concern Present (12/16/2022)   [employer] of Occupational Health - Occupational Stress Questionnaire    Feeling of Stress : Not at all  Social Connections: Moderately Integrated (12/16/2022)   Social Connection and Isolation Panel [NHANES]    Frequency of Communication with Friends and Family: More than three times a week    Frequency of Social Gatherings with Friends and Family: Three times a week    Attends Religious Services: More than 4 times per year    Active Member of Clubs or Organizations: Yes    Attends Banker Meetings: 1 to 4 times per year    Marital Status: Divorced     Family History:  The patient's family history includes Arthritis in her father; Atrial fibrillation in her mother; Dementia in her maternal grandmother; Healthy in her brother, brother, brother, daughter, and son; Heart disease in her father and maternal grandmother; Hypercholesterolemia in her maternal grandmother; Hypertension in her father and maternal grandmother; Leukemia in her father; Neuropathy in her mother. There is no history of Stroke, Cancer, or Breast cancer.  ROS:   12-point review of systems is negative unless otherwise noted in the HPI.   EKGs/Labs/Other Studies Reviewed:    Studies reviewed were summarized above. The additional studies were reviewed today:  01/14/2023: FINDINGS: Aorta: Normal size. Mild ascending aortic wall calcifications. No dissection.   Aortic Valve:  Trileaflet.  No calcifications.   Coronary Arteries:  Normal coronary origin.  Right dominance.   RCA is a dominant artery. There is no plaque.   Left main gives rise to LAD and LCX arteries. LM has no disease.   LAD has calcified plaque in the mid vessel causing minimal stenosis (<25%).   LCX is a non-dominant artery.   There is no plaque.   Other findings:   Normal pulmonary vein drainage into the left atrium.   Normal left atrial appendage without a thrombus.   Normal size of the pulmonary artery.   IMPRESSION: 1. Coronary calcium score of 13.7. This was 88th percentile for age and sex matched control. 2. Normal coronary origin with right dominance. 3. Minimal stenosis in mid LAD (<25%). 4. CAD-RADS 1. Minimal non-obstructive CAD (0-24%). Consider  preventive therapy and risk factor modification. __________  2D echo 12/15/2022: 1. Left ventricular ejection fraction, by estimation, is 55 to 60%. Left  ventricular ejection fraction by 2D MOD biplane is 56.8 %. The left  ventricle has normal function. The left ventricle has no regional wall  motion abnormalities. Left ventricular  diastolic parameters were normal. The average left ventricular global  longitudinal strain is -18.5 %. The global longitudinal strain is normal.   2. Right ventricular systolic function is normal. The right ventricular  size is normal.   3. The mitral valve is normal in structure. No evidence of mitral valve  regurgitation.   4. The aortic valve is tricuspid. Aortic valve regurgitation is not  visualized.   5. The inferior vena cava is normal in size with greater than 50%  respiratory variability, suggesting right atrial pressure of 3 mmHg.    EKG:  EKG is not ordered today.    Recent Labs: 01/12/2023: ALT 7; BUN 8; Creatinine, Ser 0.60; Hemoglobin 13.8; Platelets 120; Potassium 4.0; Sodium 142; TSH 5.010  Recent Lipid Panel    Component Value Date/Time   CHOL 202 (H) 01/12/2023 1342   TRIG 94 01/12/2023 1342   HDL 52 01/12/2023 1342   CHOLHDL 3.9 01/12/2023 1342   LDLCALC 133 (H) 01/12/2023 1342    PHYSICAL EXAM:    VS:  BP 110/64 (BP Location: Left Arm, Patient Position: Sitting, Cuff Size: Normal)   Pulse 74   Ht 5\' 1"  (1.549 m)   Wt 186 lb 3.2 oz (84.5 kg)   SpO2 98%   BMI 35.18 kg/m   BMI: Body mass  index is 35.18 kg/m.  Physical Exam Vitals reviewed.  Constitutional:      Appearance: She is well-developed.  HENT:     Head: Normocephalic and atraumatic.  Eyes:     General:        Right eye: No discharge.        Left eye: No discharge.  Neck:     Vascular: No JVD.  Cardiovascular:     Rate and Rhythm: Normal rate and regular rhythm.     Heart sounds: Normal heart sounds, S1 normal and S2 normal. Heart sounds not distant. No midsystolic click and no opening snap. No murmur heard.    No friction rub.  Pulmonary:     Effort: Pulmonary effort is normal. No respiratory distress.     Breath sounds: Normal breath sounds. No decreased breath sounds, wheezing or rales.  Chest:     Chest wall: No tenderness.  Abdominal:     General: There is no distension.  Musculoskeletal:     Cervical back: Normal range of motion.     Right lower leg: No edema.     Left lower leg: No edema.  Skin:    General: Skin is warm and dry.     Nails: There is no clubbing.  Neurological:     Mental Status: She is alert and oriented to person, place, and time.  Psychiatric:        Speech: Speech normal.        Behavior: Behavior normal.        Thought Content: Thought content normal.        Judgment: Judgment normal.     Wt Readings from Last 3 Encounters:  01/26/23 186 lb 3.2 oz (84.5 kg)  01/20/23 187 lb (84.8 kg)  01/19/23 183 lb (83 kg)     ASSESSMENT & PLAN:   Nonobstructive CAD:  No symptoms concerning for angina or cardiac decompensation.  Coronary CTA showed less than 25% stenosis involving the mid LAD with otherwise normal coronary arteries.  Continue aggressive risk factor modification and primary prevention including complete smoking cessation and atorvastatin 80 mg daily (she reports adherence to this).  Not on aspirin secondary to intolerance with noted heart palpitations.  No indication for further ischemic testing at this time.  HLD: LDL 133 in 01/2023 with normal AST/ALT at that  time.  Target LDL less than 70.  She reports adherence to atorvastatin over the past several months.  We did discuss possible addition of ezetimibe, however she would like to continue tapering tobacco use and work on lifestyle modification.  We will look to obtain lipid panel at next visit with recommendation to escalate lipid-lowering therapy as indicated to achieve target LDL.  COPD/asthma with ongoing tobacco use: No acute exacerbation.  Complete smoking cessation is recommended.    Disposition: F/u with Dr. Azucena Cecil or an APP in 6 months.   Medication Adjustments/Labs and Tests Ordered: Current medicines are reviewed at length with the patient today.  Concerns regarding medicines are outlined above. Medication changes, Labs and Tests ordered today are summarized above and listed in the Patient Instructions accessible in Encounters.   Signed, Eula Listen, PA-C 01/26/2023 2:54 PM     [facility] [place] [address] [place], [place] [address] [phone]

## 2023-01-23 ENCOUNTER — Other Ambulatory Visit: Payer: Self-pay | Admitting: Physician Assistant

## 2023-01-23 NOTE — Progress Notes (Unsigned)
          Acute Office Visit   Patient: Janet Murray   DOB: 05/16/1971   52 y.o. Female  MRN: [id number] Visit Date: 01/23/2023  Today's healthcare provider: Oswaldo Conroy Romanita Fager, PA-C  Introduced myself to the patient as a Secondary school teacher and provided education on APPs in clinical practice.    No chief complaint on file.  Subjective    HPI    Medications: Outpatient Medications Prior to Visit  Medication Sig   albuterol (PROVENTIL) (2.5 MG/3ML) 0.083% nebulizer solution Take 3 mLs (2.5 mg total) by nebulization every 6 (six) hours as needed for wheezing or shortness of breath.   albuterol (VENTOLIN HFA) 108 (90 Base) MCG/ACT inhaler Inhale 2 puffs into the lungs every 6 (six) hours as needed for wheezing or shortness of breath.   AMBULATORY NON FORMULARY MEDICATION Medication Name: nebulizer DX: J44.9   atorvastatin (LIPITOR) 80 MG tablet TAKE 1 TABLET BY MOUTH DAILY   diclofenac Sodium (VOLTAREN) 1 % GEL Apply 1 application topically daily as needed (Knee pain).   esomeprazole (NEXIUM) 40 MG capsule TAKE 1 CAPSULE BY MOUTH TWICE DAILY   fluticasone (FLONASE) 50 MCG/ACT nasal spray Place 2 sprays into both nostrils daily.   Glycopyrrolate-Formoterol (BEVESPI AEROSPHERE) 9-4.8 MCG/ACT AERO 2 puffs twice a day   hydrOXYzine (ATARAX/VISTARIL) 25 MG tablet Take 1 tablet (25 mg total) by mouth 3 (three) times daily as needed. (Patient taking differently: Take 25 mg by mouth 3 (three) times daily as needed for anxiety.)   levothyroxine (SYNTHROID) 175 MCG tablet Take 1 tablet (175 mcg total) by mouth daily.   liraglutide (VICTOZA) 18 MG/3ML SOPN Inject 0.6 mg into the skin daily.   metoprolol tartrate (LOPRESSOR) 100 MG tablet Take 1 tablet (100 mg total) by mouth once for 1 dose. TWO HOURS PRIOR TO CARDIAC CTA   mometasone (ELOCON) 0.1 % lotion External for 30 Days   montelukast (SINGULAIR) 10 MG tablet TAKE 1 TABLET BY MOUTH AT BEDTIME   naloxone (NARCAN) nasal spray 4 mg/0.1 mL SMARTSIG:Both Nares    ondansetron (ZOFRAN-ODT) 4 MG disintegrating tablet TAKE 1 TABLET BY MOUTH EVERY 8 HOURS AS NEEDED   oxyCODONE (OXY IR/ROXICODONE) 5 MG immediate release tablet Take 1 tablet (5 mg total) by mouth every 4 (four) hours as needed for severe pain.   pramipexole (MIRAPEX) 0.5 MG tablet TAKE 1 TABLET BY MOUTH 3 TIMES DAILY   tiZANidine (ZANAFLEX) 2 MG tablet Take 2 mg by mouth 2 (two) times daily.   triamcinolone cream (KENALOG) 0.1 % Apply 1 Application topically 2 (two) times daily.   Vitamin D, Ergocalciferol, (DRISDOL) 1.25 MG (50000 UNIT) CAPS capsule Take 1 capsule (50,000 Units total) by mouth once a week. For 12 weeks. Then start OTC Vitamin D3 2,000 unit daily.   No facility-administered medications prior to visit.    Review of Systems  {Insert previous labs (optional):23779} {See past labs  Heme  Chem  Endocrine  Serology  Results Review (optional):1}   Objective    There were no vitals taken for this visit. {Insert last BP/Wt (optional):23777}{See vitals history (optional):1}   Physical Exam    No results found for any visits on 01/23/23.  Assessment & Plan      No follow-ups on file.

## 2023-01-23 NOTE — Telephone Encounter (Signed)
Left message for patient to notify her of Erin's recommendations in regards to previous telephone encounter. Advised patient to give our office a call back if she has any questions or concerns regarding the voice message left.

## 2023-01-26 ENCOUNTER — Encounter: Payer: Self-pay | Admitting: Physician Assistant

## 2023-01-26 ENCOUNTER — Ambulatory Visit: Payer: 59 | Attending: Cardiology | Admitting: Physician Assistant

## 2023-01-26 VITALS — BP 110/64 | HR 74 | Ht 61.0 in | Wt 186.2 lb

## 2023-01-26 DIAGNOSIS — F17200 Nicotine dependence, unspecified, uncomplicated: Secondary | ICD-10-CM

## 2023-01-26 DIAGNOSIS — E785 Hyperlipidemia, unspecified: Secondary | ICD-10-CM

## 2023-01-26 DIAGNOSIS — I2510 Atherosclerotic heart disease of native coronary artery without angina pectoris: Secondary | ICD-10-CM

## 2023-01-26 NOTE — Progress Notes (Signed)
Established Patient Office Visit  Subjective   Patient ID: Janet Murray, female    DOB: 1971/04/25  Age: 52 y.o. MRN: [id number]  Chief Complaint  Patient presents with    removal of skin tag    HPI    This 52 yo P2 is here for the removal of a left perilabial skin lesion. She was seen here by Paula Compton last month and the lesion was noted. She is unsure when it first appeared.  Objective:   Well nourished, well hydrated White female, no apparent distress She is ambulating and conversing normally.   BP 117/62   Pulse 71   Ht 5\' 1"  (1.549 m)   Wt 187 lb (84.8 kg)   BMI 35.33 kg/m    Physical Exam  There was a 1 x 0.5 cm light [place] warty looking linear lesion to the outside of her left labia majora at the lower portion. I prepped the area with betadine, sprayed Hurricaine spray, and then injected 1% lidocaine. I then excised the entire area with scissors. I used silver nitrate to achieve hemostasis. She tolerated the procedure well.  Results for orders placed or performed in visit on 01/20/23  Surgical pathology  Result Value Ref Range   SURGICAL PATHOLOGY      SURGICAL PATHOLOGY CASE: [id number] PATIENT: Verneal Murray Surgical Pathology Report     Clinical History: skin tag of labia (cm)     FINAL MICROSCOPIC DIAGNOSIS:  A. SKIN TAG, LABIA, EXCISION: -  Verrucous keratosis.    GROSS DESCRIPTION:  Received in formalin is a 0.8 x 0.4 x 0.2 cm fragment of tan-gray skin. The specimen is entirely submitted in 1 block. (KW, 01/21/2023)    Final Diagnosis performed by Orene Desanctis DO.   Electronically signed 01/22/2023 Technical component performed at [employer], [address] [place], [place] [address].  Professional component performed at [facility], [address] [place], [place] [address].  Immunohistochemistry Technical component (if applicable) was performed at [facility]. [address], [place], [place] [address].   IMMUNOHISTOCHEMISTRY DISCLAIMER (if applicable): Some of these immunohistochemical stains may have been developed and  the performance characteristics determine by [facility]. Some may not have been cleared or approved by the U.S. Food and Drug Administration. The FDA has determined that such clearance or approval is not necessary. This test is used for clinical purposes. It should not be regarded as investigational or for research. This laboratory is certified under the Clinical Laboratory Improvement Amendments of 1988 (CLIA-88) as qualified to perform high complexity clinical laboratory testing.  The controls stained appropriately.   IHC stains are performed on formalin fixed, paraffin embedded tissue using a 3,3"diaminobenzidine (DAB) chromogen and Leica Bond Autostainer System. The staining intensity of the nucleus is score manually and is reported as the percentage of tumor cell nuclei demonstrating specific nuclear staining. The specimens are fixed in 10% Neutral Formalin for at least 6 hours and up to 72hrs. These tests are validated on decalcified tissue. Results s hould be interpreted with caution given the possibility of false negative results on decalcified specimens. Antibody Clones are as follows ER-clone 44F, PR-clone 16, Ki67- clone MM1. Some of these immunohistochemical stains may have been developed and the performance characteristics determined by [facility].       The 10-year ASCVD risk score (Arnett DK, et al., 2019) is: 6.9%    Assessment & Plan:   Problem List Items  Addressed This Visit   None Visit Diagnoses     Skin tag of labia    -  Primary   Relevant Orders   Surgical pathology (Completed)       No follow-ups on file.    Allie Bossier, MD

## 2023-01-26 NOTE — Patient Instructions (Signed)
Medication Instructions:  Your Physician recommend you continue on your current medication as directed.     *If you need a refill on your cardiac medications before your next appointment, please call your pharmacy*  Lab Work: None   Testing/Procedures: None  Follow-Up: At [facility], you and your health needs are our priority.  As part of our continuing mission to provide you with exceptional heart care, we have created designated Provider Care Teams.  These Care Teams include your primary Cardiologist (physician) and Advanced Practice Providers (APPs -  Physician Assistants and Nurse Practitioners) who all work together to provide you with the care you need, when you need it.  We recommend signing up for the patient portal called "MyChart".  Sign up information is provided on this After Visit Summary.  MyChart is used to connect with patients for Virtual Visits (Telemedicine).  Patients are able to view lab/test results, encounter notes, upcoming appointments, etc.  Non-urgent messages can be sent to your provider as well.   To learn more about what you can do with MyChart, go to [email or web address].    Your next appointment:   6 month(s)  Provider:   You may see Debbe Odea, MD or one of the following Advanced Practice Providers on your designated Care Team:    Eula Listen, [place]

## 2023-02-02 DIAGNOSIS — G9050 Complex regional pain syndrome I, unspecified: Secondary | ICD-10-CM | POA: Diagnosis not present

## 2023-02-03 ENCOUNTER — Telehealth: Payer: Self-pay | Admitting: Nurse Practitioner

## 2023-02-03 NOTE — Telephone Encounter (Signed)
Pt called reporting that her insurance needs a prior auth for her Victoza injection  Please advise and contact pt once Prior Berkley Harvey is approved, she is requesting a call back  Best contact: [phone]

## 2023-02-03 NOTE — Telephone Encounter (Signed)
Spoke with patient and she says she spoke with her [employer] and was told that her Victoza medication was approved and they are waiting on an approval code from our office. I informed patient PA was initiated via CoverMyMeds and was denied due to "Plan Exclusion." Patient would like for [place] to initiate another Prior Authorization. Attempted another Prior Authorization and it was denied due to previous denial from insurance.

## 2023-02-04 NOTE — Telephone Encounter (Signed)
Spoke with patient and made her aware of the denial per her insurance. Patient is asking if the provider can send over an alternative for her to her local pharmacy. Please advise?

## 2023-02-10 ENCOUNTER — Other Ambulatory Visit: Payer: Self-pay | Admitting: Physician Assistant

## 2023-02-10 ENCOUNTER — Ambulatory Visit (INDEPENDENT_AMBULATORY_CARE_PROVIDER_SITE_OTHER): Payer: 59 | Admitting: Obstetrics and Gynecology

## 2023-02-10 ENCOUNTER — Encounter: Payer: Self-pay | Admitting: Obstetrics and Gynecology

## 2023-02-10 ENCOUNTER — Other Ambulatory Visit (HOSPITAL_COMMUNITY)
Admission: RE | Admit: 2023-02-10 | Discharge: 2023-02-10 | Disposition: A | Discharge status: Home / Self Care | Payer: 59 | Attending: Obstetrics and Gynecology | Admitting: Obstetrics and Gynecology

## 2023-02-10 VITALS — BP 111/80 | HR 82 | Ht 61.69 in | Wt 188.4 lb

## 2023-02-10 DIAGNOSIS — R319 Hematuria, unspecified: Secondary | ICD-10-CM | POA: Insufficient documentation

## 2023-02-10 DIAGNOSIS — N393 Stress incontinence (female) (male): Secondary | ICD-10-CM | POA: Diagnosis not present

## 2023-02-10 DIAGNOSIS — R3989 Other symptoms and signs involving the genitourinary system: Secondary | ICD-10-CM

## 2023-02-10 DIAGNOSIS — E6609 Other obesity due to excess calories: Secondary | ICD-10-CM

## 2023-02-10 DIAGNOSIS — R350 Frequency of micturition: Secondary | ICD-10-CM

## 2023-02-10 DIAGNOSIS — N3010 Interstitial cystitis (chronic) without hematuria: Secondary | ICD-10-CM

## 2023-02-10 DIAGNOSIS — K760 Fatty (change of) liver, not elsewhere classified: Secondary | ICD-10-CM

## 2023-02-10 DIAGNOSIS — M62838 Other muscle spasm: Secondary | ICD-10-CM | POA: Diagnosis not present

## 2023-02-10 DIAGNOSIS — N952 Postmenopausal atrophic vaginitis: Secondary | ICD-10-CM

## 2023-02-10 DIAGNOSIS — N3281 Overactive bladder: Secondary | ICD-10-CM

## 2023-02-10 LAB — URINALYSIS, ROUTINE W REFLEX MICROSCOPIC
Bacteria, UA: NONE SEEN
Bilirubin Urine: NEGATIVE
Glucose, UA: NEGATIVE mg/dL
Ketones, ur: NEGATIVE mg/dL
Nitrite: NEGATIVE
Protein, ur: NEGATIVE mg/dL
Specific Gravity, Urine: 1.015 (ref 1.005–1.030)
pH: 5 (ref 5.0–8.0)

## 2023-02-10 LAB — POCT URINALYSIS DIPSTICK
Bilirubin, UA: NEGATIVE
Glucose, UA: NEGATIVE
Ketones, UA: NEGATIVE
Nitrite, UA: NEGATIVE
Protein, UA: NEGATIVE
Spec Grav, UA: 1.025 (ref 1.010–1.025)
Urobilinogen, UA: 0.2 U/dL
pH, UA: 6 (ref 5.0–8.0)

## 2023-02-10 MED ORDER — SEMAGLUTIDE-WEIGHT MANAGEMENT 1.7 MG/0.75ML ~~LOC~~ SOAJ: 1.7000 mg | SUBCUTANEOUS | 0 refills | Status: DC

## 2023-02-10 MED ORDER — SEMAGLUTIDE-WEIGHT MANAGEMENT 0.5 MG/0.5ML ~~LOC~~ SOAJ
0.5000 mg | SUBCUTANEOUS | 1 refills | Status: DC
Start: 2023-03-11 — End: 2023-04-20

## 2023-02-10 MED ORDER — HEPARIN SODIUM (PORCINE) 10000 UNIT/ML IJ SOLN
10000.0000 [IU] | Freq: Once | INTRAMUSCULAR | Status: AC
Start: 2023-02-10 — End: 2023-02-10
  Administered 2023-02-10: 10000 [IU] via INTRAVESICAL

## 2023-02-10 MED ORDER — ESTRADIOL 0.1 MG/GM VA CREA
0.5000 g | TOPICAL_CREAM | VAGINAL | 11 refills | Status: DC
Start: 2023-02-12 — End: 2023-06-30

## 2023-02-10 MED ORDER — SODIUM BICARBONATE 8.4 % IV SOLN
5.0000 mL | Freq: Once | INTRAVENOUS | Status: AC
Start: 2023-02-10 — End: 2023-02-10
  Administered 2023-02-10: 5 mL

## 2023-02-10 MED ORDER — LIDOCAINE HCL 2 % IJ SOLN
20.0000 mL | Freq: Once | INTRAMUSCULAR | Status: AC
Start: 2023-02-10 — End: 2023-02-10
  Administered 2023-02-10: 400 mg

## 2023-02-10 MED ORDER — SEMAGLUTIDE-WEIGHT MANAGEMENT 1 MG/0.5ML ~~LOC~~ SOAJ: 1.0000 mg | SUBCUTANEOUS | 0 refills | Status: DC

## 2023-02-10 MED ORDER — SEMAGLUTIDE-WEIGHT MANAGEMENT 0.25 MG/0.5ML ~~LOC~~ SOAJ
0.2500 mg | SUBCUTANEOUS | 0 refills | Status: AC
Start: 2023-02-10 — End: 2023-03-10

## 2023-02-10 MED ORDER — MIRABEGRON ER 50 MG PO TB24: 50.0000 mg | ORAL_TABLET | Freq: Every day | ORAL | 5 refills | Status: AC

## 2023-02-10 MED ORDER — SEMAGLUTIDE-WEIGHT MANAGEMENT 2.4 MG/0.75ML ~~LOC~~ SOAJ
2.4000 mg | SUBCUTANEOUS | 0 refills | Status: DC
Start: 2023-06-06 — End: 2023-04-20

## 2023-02-10 MED ORDER — BUPIVACAINE HCL 0.25 % IJ SOLN
20.0000 mL | Freq: Once | INTRAMUSCULAR | Status: AC
Start: 2023-02-10 — End: 2023-02-10
  Administered 2023-02-10: 20 mL

## 2023-02-10 NOTE — Progress Notes (Unsigned)
[facility] New Patient Evaluation and Consultation  Referring Provider: Mirna Mires, CNM PCP: Larae Grooms, NP Date of Service: 02/10/2023  SUBJECTIVE Chief Complaint: New Patient (Initial Visit) (Janet Murray is a 52 y.o. female is here for incontinence. )  History of Present Illness: Janet Murray is a 52 y.o. White or Caucasian female seen in consultation at the request of Dr. Eunice Blase for evaluation of incontinence and IC.    Review of records significant for: Has a hx of surgical sling  A1c is 5.4 (01/12/23)  She smokes half a pack per day, but reports she can do without it if she needs to.   Urinary Symptoms: Leaks urine with cough/ sneeze, laughing, exercise, lifting, going from sitting to standing, with a full bladder, with urgency, while asleep, and continuously Leaks unknown time(s) per days.   She is bothered by her UI symptoms.  Day time voids >10.  Nocturia: >4 times per night to void. Voiding dysfunction: she empties her bladder well.  does not use a catheter to empty bladder.  When urinating, she feels difficulty starting urine stream Drinks: 40-60oz Water, 20oz Sprite per day  UTIs: 1 UTI's in the last year.   Reports history of blood in urine  Pelvic Organ Prolapse Symptoms:                  She Denies a feeling of a bulge the vaginal area.  She Denies seeing a bulge.  This bulge is not bothersome.  Bowel Symptom: Bowel movements: 3-4 time(s) per week Stool consistency: hard Straining: no.  Splinting: no.  Incomplete evacuation: no.  She Denies accidental bowel leakage / fecal incontinence Bowel regimen: none Last colonoscopy: Date 10/22/2021, Results colonic polyps   Sexual Function Sexually active: no.  Sexual orientation: Straight Pain with sex: Yes  Pelvic Pain Admits to pelvic pain    Past Medical History:  Past Medical History:  Diagnosis Date  . Abdominal mass, RUQ (right upper quadrant)   . Anxiety   .  Arthritis   . Asthma   . Bronchitis   . Cancer (HCC) 04/12/2021  . Carpal tunnel syndrome of left wrist 10/25/2014  . Chronic bronchitis, obstructive 07/20/2017  . Chronic pain   . Colon cancer (HCC)   . COPD (chronic obstructive pulmonary disease) (HCC)   . Coronary artery disease   . Cyst of right kidney 03/22/2018  . DDD (degenerative disc disease), lumbar 10/25/2014  . Degenerative joint disease (DJD) of hip   . Diarrhea of infectious origin 03/25/2018  . Dysphagia   . Dyspnea   . Emphysema of lung (HCC)   . Facet syndrome, lumbar 10/25/2014  . Fatty liver 03/25/2018  . Foot fracture, left 05/02/2015  . 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 GERD (gastroesophageal reflux disease)   . Hereditary and idiopathic peripheral neuropathy 11/06/2014  . Hypothyroidism   . IBS (irritable bowel syndrome)   . Low back pain   . Migraine 02/04/2016  . Occipital neuralgia   . 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 Other intervertebral disc degeneration, lumbar region   . Other irritable bowel syndrome 04/20/2017  . Other spondylosis with radiculopathy, lumbar region 10/25/2014  . RLS (restless legs syndrome)   . Sacroiliac joint dysfunction of both sides 10/25/2014  . Spondylosis of lumbar spine   . Stomach cancer (HCC)   . Urinary incontinence      Past Surgical History:   Past Surgical History:  Procedure Laterality Date  .  ABDOMINAL HYSTERECTOMY    . APPENDECTOMY    . BLADDER SURGERY     sling - Dr. Barnabas Lister  . CESAREAN SECTION    . COLONOSCOPY WITH PROPOFOL N/A 04/11/2020   Procedure: COLONOSCOPY WITH PROPOFOL;  Surgeon: Pasty Spillers, MD;  Location: [facility] ENDOSCOPY;  Service: Endoscopy;  Laterality: N/A;  . COLONOSCOPY WITH PROPOFOL N/A 10/22/2021   Procedure: COLONOSCOPY WITH PROPOFOL;  Surgeon: Toney Reil, MD;  Location: [facility] ENDOSCOPY;  Service: Gastroenterology;  Laterality: N/A;  . ESOPHAGOGASTRODUODENOSCOPY  04/26/2019  .  STOMACH SURGERY    . THYROIDECTOMY N/A 04/12/2021   Procedure: THYROIDECTOMY, total;  Surgeon: Leafy Ro, MD;  Location: [facility] ORS;  Service: General;  Laterality: N/A;  . TOTAL ABDOMINAL HYSTERECTOMY W/ BILATERAL SALPINGOOPHORECTOMY       Past OB/GYN History: G2 P2 Vaginal deliveries: 1,  Forceps/ Vacuum deliveries: 0, Cesarean section: 1 Menopausal: Yes, at age 57 Any history of abnormal pap smears: yes.   Medications: She has a current medication list which includes the following prescription(s): albuterol, albuterol, AMBULATORY NON FORMULARY MEDICATION, atorvastatin, diclofenac sodium, esomeprazole, [START ON 02/12/2023] estradiol, fluticasone, bevespi aerosphere, hydroxyzine, levothyroxine, mirabegron er, mometasone, montelukast, naloxone, ondansetron, oxycodone, pramipexole, tizanidine, triamcinolone cream, vitamin d (ergocalciferol), semaglutide-weight management, [START ON 03/11/2023] semaglutide-weight management, [START ON 04/09/2023] semaglutide-weight management, [START ON 05/08/2023] semaglutide-weight management, and [START ON 06/06/2023] semaglutide-weight management.   Allergies: Patient is allergic to penicillins, acetaminophen, ciprofloxacin, aspirin, hydrocodone, and tape.   Social History:  Social History   Tobacco Use  . Smoking status: Every Day    Current packs/day: 0.50    Average packs/day: 0.5 packs/day for 25.0 years (12.5 ttl pk-yrs)    Types: Cigarettes  . Smokeless tobacco: Never  . Tobacco comments:    0.5PPD 11/11/2022 khj  Vaping Use  . Vaping status: Never Used  Substance Use Topics  . Alcohol use: Not Currently    Comment: occassionally  . Drug use: No    Relationship status: single She lives with her son and daughter in law.   She is not employed. Regular exercise: Yes:   History of abuse: No  Family History:   Family History  Problem Relation Age of Onset  . Neuropathy Mother   . Atrial fibrillation Mother   . Arthritis Father   .  Hypertension Father   . Leukemia Father   . Heart disease Father        CAD s/p CABG  . Healthy Brother   . Healthy Brother   . Healthy Brother   . Dementia Maternal Grandmother   . Hypertension Maternal Grandmother   . Hypercholesterolemia Maternal Grandmother   . Heart disease Maternal Grandmother   . Healthy Daughter   . Healthy Son   . Stroke Neg Hx   . Cancer Neg Hx   . Breast cancer Neg Hx      Review of Systems: Review of Systems  Constitutional:  Positive for malaise/fatigue. Negative for chills and fever.  Respiratory:  Positive for cough. Negative for shortness of breath and wheezing.   Cardiovascular:  Negative for chest pain and palpitations.  Gastrointestinal:  Positive for abdominal pain.  Genitourinary:  Positive for dysuria.  Neurological:  Negative for dizziness, weakness and headaches.  Endo/Heme/Allergies:  Does not bruise/bleed easily.       +Hot Flashes  Psychiatric/Behavioral:  Negative for depression and suicidal ideas. The patient is not nervous/anxious.      OBJECTIVE Physical Exam: Vitals:   02/10/23 1322  BP: 111/80  Pulse: 82  Weight: 188 lb 6.4 oz (85.5 kg)  Height: 5' 1.69" (1.567 m)    Physical Exam Constitutional:      Appearance: Normal appearance.  Pulmonary:     Effort: Pulmonary effort is normal.  Abdominal:     General: Abdomen is flat.  Neurological:     General: No focal deficit present.     Mental Status: She is alert and oriented to person, place, and time.  Psychiatric:        Mood and Affect: Mood normal.        Behavior: Behavior normal.        Thought Content: Thought content normal.        Judgment: Judgment normal.     GU / Detailed Urogynecologic Evaluation:  Pelvic Exam: Normal external female genitalia; Bartholin's and Skene's glands normal in appearance; urethral meatus normal in appearance, no urethral masses or discharge.   CST: negative  s/p hysterectomy: Speculum exam reveals normal vaginal mucosa  with  atrophy and normal vaginal cuff.  Adnexa normal adnexa.    With apex supported, anterior compartment defect was reduced  Pelvic floor strength I/V  Pelvic floor musculature: Right levator tender, Right obturator tender, Left levator tender, Left obturator tender  POP-Q:   POP-Q  -2.5                                            Aa   -2.5                                           Ba  -7                                              C   3                                            Gh  5.5                                            Pb  8                                            tvl   -3                                            Ap  -3                                            Bp  D      Rectal Exam:  Normal external exam  Post-Void Residual (PVR) by Bladder Scan: In order to evaluate bladder emptying, we discussed obtaining a postvoid residual and she agreed to this procedure.  Procedure: The ultrasound unit was placed on the patient's abdomen in the suprapubic region after the patient had voided. A PVR of 17 ml was obtained by bladder scan.  Bladder Instillation: The patient was identified and verbally consented for the procedure.  The urethra was prepped with Betadine x 3. A 16 Fr foley catheter was inserted the bladder and drained for 100 cc. The foley was then attached to a 60 mL syringe with the plunger removed.  The medication was slowly poured into the bladder via the syringe and foley.  The medication consisted of: 20ml of Lidocaine 2%, 20mL of Bupivicaine 0.25%, 10,000 units/mL Heparin, 5mL Sodium Bicarbonate 8.4%.  The foley was removed and the patient was asked to hold the liquids in her bladder for 30-60 minutes if possible.    Selmer Dominion, NP   Laboratory Results: POC Urine: +Moderate blood and Trace leukocytes.   ASSESSMENT AND PLAN Janet Murray is a 52 y.o. with:  1. SUI (stress urinary  incontinence, female)   2. Overactive bladder   3. Urinary frequency   4. Bladder pain   5. IC (interstitial cystitis)   6. Hematuria, unspecified type   7. Vaginal atrophy   8. Levator spasm    Patient reports she has significant leaking with cough, sneeze, and laugh. She is concerned about this leakage and believes she wants a sling. We discussed sling versus urethral bulking. She is a smoker and reports she can stop smoking to get this surgery done. Information given on both sling and bulking. Will plan for her to return for urodynamics since she has trouble differentiating between UUI and SUI.  We discussed the symptoms of overactive bladder (OAB), which include urinary urgency, urinary frequency, nocturia, with or without urge incontinence.  While we do not know the exact etiology of OAB, several treatment options exist. We discussed management including behavioral therapy (decreasing bladder irritants, urge suppression strategies, timed voids, bladder retraining), physical therapy, medication; for refractory cases posterior tibial nerve stimulation, sacral neuromodulation, and intravesical botulinum toxin injection. For anticholinergic medications, we discussed the potential side effects of anticholinergics including dry eyes, dry mouth, constipation, cognitive impairment and urinary retention. For Beta-3 agonist medication, we discussed the potential side effect of elevated blood pressure which is more likely to occur in individuals with uncontrolled hypertension.  Will start Patient on Myrbetriq 50mg  daily. We discussed symptoms to watch for and when to stop medication. She reports she will let us know if she has any issues. Patient reports she has bladder pain. She has previously had an IC diagnosis and has not been doing treatment.  She would like a bladder installation today. Will perform bladder installation. See above procedure note.  Will send urine for culture and micro.  Patient has  vaginal atrophy on exam. She would benefit from estrogen cream. Patient to use a blueberry sized amount into the vagina. She may use this nightly for 2 weeks and then twice weekly after. We discussed using her finger instead of using the applicator.   Patient had significant pain in the pelvic floor muscles. Would do well to start pelvic floor PT. Referral placed for pelvic floor PT.   Patient to follow up for UDS and medication follow up.     Selmer Dominion,  NP

## 2023-02-10 NOTE — Patient Instructions (Addendum)
Today we talked about ways to manage bladder urgency such as altering your diet to avoid irritative beverages and foods (bladder diet) as well as attempting to decrease stress and other exacerbating factors.  You can also chew a plain Tums 1-3 times per day to make your urine less acidic, especially if you have eating/drinking acidic things.   There is a website with helpful information for people with bladder irritation, called the IC Network at https://www.ic-network.com. This website has more information about a healthy bladder diet and patient forums for support.  The Most Bothersome Foods* The Least Bothersome Foods*  Coffee - Regular & Decaf Tea - caffeinated Carbonated beverages - cola, non-colas, diet & caffeine-free Alcohols - Beer, Red Wine, White Wine, [address] - Grapefruit, [place], Orange, [employer] - Cranberry, Grapefruit, Orange, Pineapple Vegetables - Tomato & Tomato Products Flavor Enhancers - Hot peppers, Spicy foods, Chili, Horseradish, Vinegar, Monosodium glutamate (MSG) Artificial Sweeteners - NutraSweet, Sweet 'N Low, Equal (sweetener), Saccharin Ethnic foods - [place], [place], [place] food [employer] - low-fat & whole Fruits - Bananas, Blueberries, Honeydew melon, Pears, Raisins, Watermelon Vegetables - Broccoli, [address] Sprouts, [place], Carrots, Cauliflower, [place], Cucumber, Mushrooms, Peas, Radishes, Squash, Zucchini, White potatoes, Sweet potatoes & yams Poultry - Chicken, Eggs, [place], [employer] - Beef, Diplomatic Services operational officer, Lamb Seafood - Shrimp, [place] fish, Salmon Grains - Oat, Rice Snacks - Pretzels, Popcorn  *Lenward Chancellor et al. Diet and its role in interstitial cystitis/bladder pain syndrome (IC/BPS) and comorbid conditions. [employer]. BJU Int. 2012 Jan 11.     For the bladder test please come with a comfortably full bladder and don't pee until we get you in the room.   Start the estrogen cream and use it nightly for 2 weeks and then after that twice  weekly.   Start Myrbetriq for overactive bladder.

## 2023-02-11 ENCOUNTER — Other Ambulatory Visit: Payer: Self-pay | Admitting: Nurse Practitioner

## 2023-02-11 ENCOUNTER — Encounter: Payer: Self-pay | Admitting: Obstetrics and Gynecology

## 2023-02-11 NOTE — Telephone Encounter (Signed)
Pt called back to report that she checked with the pharmacy on Saturday and they did not have anything for her yet. Pt wants to make sure her wegovy was called in.

## 2023-02-11 NOTE — Telephone Encounter (Signed)
Patient called to check status of wegovy medication.

## 2023-02-11 NOTE — Telephone Encounter (Signed)
Medication Refill - Medication: ondansetron (ZOFRAN-ODT) 4 MG disintegrating tablet   Has the patient contacted their pharmacy? Yes.   (Agent: If no, request that the patient contact the pharmacy for the refill. If patient does not wish to contact the pharmacy document the reason why and proceed with request.) (Agent: If yes, when and what did the pharmacy advise?)  Preferred Pharmacy (with phone number or street name):  [facility] - [place], [place] - [address]  [address] [place] [address]  Phone: [phone] Fax: [phone]   Has the patient been seen for an appointment in the last year OR does the patient have an upcoming appointment? Yes.    Agent: Please be advised that RX refills may take up to 3 business days. We ask that you follow-up with your pharmacy.

## 2023-02-12 ENCOUNTER — Telehealth: Payer: Self-pay | Admitting: Nurse Practitioner

## 2023-02-12 ENCOUNTER — Telehealth: Payer: Self-pay

## 2023-02-12 NOTE — Telephone Encounter (Signed)
Janet Murray with [facility] pharmacy called saying they will not cover the [facility] unless there is a diagnosis of diabetes.    [phone] ext [phone]

## 2023-02-12 NOTE — Telephone Encounter (Signed)
0.25 mg was set to start 02/10/23

## 2023-02-12 NOTE — Telephone Encounter (Signed)
Insurance denied prior authorization for Wegovy 0.25 MG. Per patient insurance "WEGOVY INJ 0.25MG  is denied because it is not on your plan's Drug List (formulary). Medication authorization requires the following: (1) Your provider submits medical records (for example: chart notes) documenting established cardiovascular disease as evidenced by one of the following: Prior myocardial infarction, prior stroke (that is, ischemic or hemorrhagic stroke), peripheral arterial disease (that is, intermittent claudication with ankle-brachial index less than 0.85, peripheral arterial revascularization procedure, or amputation due to atherosclerotic disease." Please advise?

## 2023-02-12 NOTE — Telephone Encounter (Signed)
Prior authorization was initiate via CoverMyMeds for prescription Wegovy 0.25 MG. Awaiting on determination from patient's insurance.   KEY:[id number]

## 2023-02-12 NOTE — Telephone Encounter (Signed)
Requested medications are due for refill today.  yes  Requested medications are on the active medications list.  yes  Last refill. 11/27/2021 #30 0 rf  Future visit scheduled.   yes  Notes to clinic.  Refill not delegated.    Requested Prescriptions  Pending Prescriptions Disp Refills   ondansetron (ZOFRAN-ODT) 4 MG disintegrating tablet 30 tablet 0    Sig: TAKE 1 TABLET BY MOUTH EVERY 8 HOURS AS NEEDED     Not Delegated - Gastroenterology: Antiemetics - ondansetron Failed - 02/11/2023 12:18 PM      Failed - This refill cannot be delegated      Passed - AST in normal range and within 360 days    AST  Date Value Ref Range Status  01/12/2023 11 0 - 40 IU/L Final   SGOT(AST)  Date Value Ref Range Status  10/29/2013 17 15 - 37 Unit/L Final         Passed - ALT in normal range and within 360 days    ALT  Date Value Ref Range Status  01/12/2023 7 0 - 32 IU/L Final   SGPT (ALT)  Date Value Ref Range Status  10/29/2013 13 12 - 78 U/L Final         Passed - Valid encounter within last 6 months    Recent Outpatient Visits           1 month ago Pulmonary emphysema, unspecified emphysema type (HCC)   [place] [facility] Mecum, Erin E, PA-C   1 month ago Pulmonary emphysema, unspecified emphysema type (HCC)   [place] [facility] Donald, [place] T, NP   5 months ago Chest pain, unspecified type   [facility] [place], Megan P, DO   7 months ago Encounter for annual wellness exam in Medicare patient   [place] [facility] Larae Grooms, NP   7 months ago Acute cystitis with hematuria   [place] [facility] Larae Grooms, NP       Future Appointments             In 6 days Loleta Chance, MD [facility] at [facility]   In 5 months Larae Grooms, NP  [facility], [facility]

## 2023-02-12 NOTE — Telephone Encounter (Signed)
Left message for patient to inform her of Denny Peon Mecum, PA recommendations. Advised patient to give our office a call back if she has any questions or concerns.   OK for [facility] to give note if patient calls back.

## 2023-02-13 NOTE — Telephone Encounter (Signed)
This encounter was created in error - please disregard.

## 2023-02-13 NOTE — Telephone Encounter (Signed)
Left a message with Ethelene Browns from [facility] to clarify which alternative medication is available and covered via patient's insurance. Advised to give our office a call back.   OK for [facility] to gather information if Ethelene Browns calls back.

## 2023-02-13 NOTE — Telephone Encounter (Signed)
Ethelene Browns from [facility] called back and stated that [place], [place], Trulicity and [place] are not covered on [employer]. Ethelene Browns stated since pt is not a diabetic it will not be covered. There are no alternative drugs.

## 2023-02-13 NOTE — Telephone Encounter (Addendum)
Pt states [employer] left VM to advise about the wegovy not being covered.  Pt used to be on Victoza, but they won't pay for it either Pt states there are other medication like Victoza the dr can choose from that are covered under her plan.Marland Kitchen  [employer] would like a call back asap  [phone]. Pt states they have been trying to contact office w/ no luck.

## 2023-02-16 MED ORDER — ONDANSETRON 4 MG PO TBDP: ORAL_TABLET | ORAL | 0 refills | Status: DC

## 2023-02-18 ENCOUNTER — Encounter: Payer: Self-pay | Admitting: Obstetrics and Gynecology

## 2023-02-18 ENCOUNTER — Ambulatory Visit (INDEPENDENT_AMBULATORY_CARE_PROVIDER_SITE_OTHER): Payer: 59 | Admitting: Obstetrics

## 2023-02-18 ENCOUNTER — Encounter: Payer: Self-pay | Admitting: Obstetrics

## 2023-02-18 ENCOUNTER — Ambulatory Visit: Payer: 59 | Admitting: Obstetrics and Gynecology

## 2023-02-18 VITALS — BP 129/75 | HR 66

## 2023-02-18 VITALS — BP 129/75 | HR 71

## 2023-02-18 DIAGNOSIS — R3989 Other symptoms and signs involving the genitourinary system: Secondary | ICD-10-CM

## 2023-02-18 DIAGNOSIS — R319 Hematuria, unspecified: Secondary | ICD-10-CM | POA: Diagnosis not present

## 2023-02-18 DIAGNOSIS — N3010 Interstitial cystitis (chronic) without hematuria: Secondary | ICD-10-CM

## 2023-02-18 DIAGNOSIS — N952 Postmenopausal atrophic vaginitis: Secondary | ICD-10-CM

## 2023-02-18 DIAGNOSIS — N3644 Muscular disorders of urethra: Secondary | ICD-10-CM | POA: Diagnosis not present

## 2023-02-18 DIAGNOSIS — N3281 Overactive bladder: Secondary | ICD-10-CM

## 2023-02-18 DIAGNOSIS — M62838 Other muscle spasm: Secondary | ICD-10-CM

## 2023-02-18 DIAGNOSIS — R350 Frequency of micturition: Secondary | ICD-10-CM

## 2023-02-18 DIAGNOSIS — N393 Stress incontinence (female) (male): Secondary | ICD-10-CM

## 2023-02-18 LAB — POCT URINALYSIS DIPSTICK
Bilirubin, UA: NEGATIVE
Blood, UA: NEGATIVE
Glucose, UA: NEGATIVE
Ketones, UA: NEGATIVE
Leukocytes, UA: NEGATIVE
Nitrite, UA: NEGATIVE
Protein, UA: POSITIVE — AB
Spec Grav, UA: 1.015 (ref 1.010–1.025)
Urobilinogen, UA: 0.2 U/dL
pH, UA: 6 (ref 5.0–8.0)

## 2023-02-18 MED ORDER — BUPIVACAINE HCL 0.25 % IJ SOLN
20.0000 mL | Freq: Once | INTRAMUSCULAR | Status: AC
Start: 2023-02-18 — End: 2023-02-18
  Administered 2023-02-18: 20 mL

## 2023-02-18 MED ORDER — LIDOCAINE HCL 2 % IJ SOLN
20.0000 mL | Freq: Once | INTRAMUSCULAR | Status: AC
Start: 2023-02-18 — End: 2023-02-18
  Administered 2023-02-18: 400 mg

## 2023-02-18 MED ORDER — SODIUM BICARBONATE 8.4 % IV SOLN
5.0000 mL | Freq: Once | INTRAVENOUS | Status: AC
Start: 2023-02-18 — End: 2023-02-18
  Administered 2023-02-18: 5 mL

## 2023-02-18 MED ORDER — HEPARIN SODIUM (PORCINE) 10000 UNIT/ML IJ SOLN
10000.0000 [IU] | Freq: Once | INTRAMUSCULAR | Status: AC
Start: 2023-02-18 — End: 2023-02-18
  Administered 2023-02-18: 10000 [IU] via INTRAVESICAL

## 2023-02-18 NOTE — Progress Notes (Signed)
[facility] Return Visit  SUBJECTIVE  History of Present Illness: Janet Murray is a 52 y.o. female seen in follow-up for urodynamics results and treatment of stress urinary incontinence and interstitial cystitis. Plan at last visit was start mirabegron 50mg  for urinary frequency, tobacco cessation, topical estrogen for vaginal atrophy, and pelvic floor PT referral.   Underwent urodynamics study today with increased sensation and decreased MCC  Most bothered by urinary frequency, urgency related leakage per patient Mirabegron with some relief, 4 underwear changes/day, 8 voids/day (baseline >10), 4 voids/night (baseline >4) Pain and pressure decreased from 7 down to 4-5 Underwent vaginal sling for urinary leakage and "bladder dropping" by Dr. Barnabas Lister at [facility] around 20 years, reports scarring also removed from gall bladder at the time of vaginal surgery with laparoscopic incisions. Symptoms started after vaginal hysterectomy around 1 month (reports around 20 surgeries) after emergency C-section complicated by Staph infection and wound complication that resulted in healing by secondary intention with wet to dry dressing Continues to report tobacco use 0.5 pack/day Denies blood in urine, pelvic radiation, exposure to chemicals Reports history of polypectomy for high grade gastric dysplasia at [facility] by Dr. Sherryll Burger, denies chemotherapy or radiation.  Started topical estrogen, denies bleeding or discharge Pending referral pelvic floor PT for urinary symptoms and pain with intercourse with pelvic floor myofascial pain in the setting of chronic pain syndrome. Underwent bladder instillation on 02/10/23 with some relief of urinary symptoms, repeated 2nd instillation today.  Per chart review: - 11/17/06 underwent cystoscopy, bladder hydrodistention and instilllation by Dr. Sherron Monday for pelvic pain.  Reported mixed urinary incontinence, suprapubic pressure, urinary frequency and RLQ  pain. UDS showed bladder overactivity and voiding dysfunction. Failed Enablex and Vesicare. Recommended Interstim and advised against midurethral sling.  - 07/21/14 ED evaluation for concerns of urinary retention for 9 days (after evaluation at [facility] and recommended to stop cold and sinus medication, Rx levaquin by PCP) due to urinary urgency and little urine output. Bladder scan and referral to [facility] urology. UA + leuk/heme/protein, culture with no growth.  - 11/30/14 ED evaluation for urinary frequency, hesitancy, suprapubic pressure and flank pain on Vesicare. CT unremarkable. Rx Keflex for pyelonephritis.   - 04/12/15 evaluated by Dr. Carles Collet for pelvic pain with a history of endometriosis, discussed reassessment after pelvic floor PT with possible trachelectomy. History of supracervical hysterectomy for pelvic pain in 2003, BSO in 2007, midurethral sling with bladder suspension in 2010. Laparoscopic gallbladder scar tissue removal 2001, laparoscopic appendectomy in 2006  04/08/18 evaluated by Dr. Richardo Hanks (urology) for right renal cyst and microscopic hematuria  04/22/18 negative CT urogram and cystoscopy.   Past Medical History: Patient  has a past medical history of Abdominal mass, RUQ (right upper quadrant), Anxiety, Arthritis, Asthma, Bronchitis, Cancer (HCC) (04/12/2021), Carpal tunnel syndrome of left wrist (10/25/2014), Chronic bronchitis, obstructive (07/20/2017), Chronic pain, Colon cancer (HCC), COPD (chronic obstructive pulmonary disease) (HCC), Coronary artery disease, Cyst of right kidney (03/22/2018), DDD (degenerative disc disease), lumbar (10/25/2014), Degenerative joint disease (DJD) of hip, Diarrhea of infectious origin (03/25/2018), Dysphagia, Dyspnea, Emphysema of lung (HCC), Facet syndrome, lumbar (10/25/2014), Fatty liver (03/25/2018), Foot fracture, left (05/02/2015), 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), GERD (gastroesophageal reflux disease), Hereditary and idiopathic  peripheral neuropathy (11/06/2014), Hypothyroidism, IBS (irritable bowel syndrome), Low back pain, Migraine (02/04/2016), Occipital neuralgia, Osteoarthritis of spine with radiculopathy, lumbar region (10/25/2014), Other intervertebral disc degeneration, lumbar region, Other irritable bowel syndrome (04/20/2017), Other spondylosis with radiculopathy, lumbar region (10/25/2014), RLS (restless legs syndrome), Sacroiliac joint  dysfunction of both sides (10/25/2014), Spondylosis of lumbar spine, Stomach cancer (HCC), and Urinary incontinence.   Past Surgical History: She  has a past surgical history that includes Bladder surgery; Cesarean section; Appendectomy; Total abdominal hysterectomy w/ bilateral salpingoophorectomy; Esophagogastroduodenoscopy (04/26/2019); Stomach surgery; Colonoscopy with propofol (N/A, 04/11/2020); Thyroidectomy (N/A, 04/12/2021); Colonoscopy with propofol (N/A, 10/22/2021); and Abdominal hysterectomy.   Medications: She has a current medication list which includes the following prescription(s): albuterol, albuterol, AMBULATORY NON FORMULARY MEDICATION, atorvastatin, diclofenac sodium, esomeprazole, estradiol, fluticasone, bevespi aerosphere, hydroxyzine, levothyroxine, mirabegron er, mometasone, montelukast, naloxone, ondansetron, oxycodone, pramipexole, semaglutide-weight management, [START ON 03/11/2023] semaglutide-weight management, [START ON 04/09/2023] semaglutide-weight management, [START ON 05/08/2023] semaglutide-weight management, [START ON 06/06/2023] semaglutide-weight management, tizanidine, triamcinolone cream, and vitamin d (ergocalciferol).   Allergies: Patient is allergic to penicillins, acetaminophen, ciprofloxacin, aspirin, hydrocodone, and tape.   Social History: Patient  reports that she has been smoking cigarettes. She has a 12.5 pack-year smoking history. She has never used smokeless tobacco. She reports that she does not currently use alcohol. She reports  that she does not use drugs.      OBJECTIVE     Physical Exam: Vitals:   02/18/23 1455  BP: 129/75  Pulse: 71   Gen: No apparent distress, A&O x 3.  Detailed Urogynecologic Evaluation: Deferred.   UDS 02/18/23: - uroflow voided 20mL, PVR 5mL First sensation: 20mL Strong Desire: Capacity: Cough stress test was negative. Valsalva stress test was positive. Terminal DO and DOI despite decreased flowrate.   Interpretation: CMG showed increased sensation, and decreased cystometric capacity. Findings positive for stress incontinence, positive for detrusor overactivity.          No data to display             ASSESSMENT AND PLAN    Ms. Oswell is a 52 y.o. with:  1. Hematuria, unspecified type   2. Vaginal atrophy   3. Levator spasm   4. Bladder pain   5. Overactive bladder   6. SUI (stress urinary incontinence, female)   7. IC (interstitial cystitis)    Microscopic hematuria - negative repeat catheterized UA today at the time of UDS - 02/10/23 + UA with moderate heme and negative microscopy with 0-5 RBC/hpf and 6-10 squamous epithelial cells - encouraged tobacco cessation due to increased risk of malignancy and history of thyroid cancer - continue low dose vaginal estrogen  Vaginal atrophy - continue low dose vaginal estrogen - denies vaginal bleeding or abnormal discharge  Levator spasm - pending pelvic floor PT referral   Bladder pain - continue bladder instillation today due to relief, repeat in 1 week for a total of 6  - encouraged fluid management and diet modification - pending pelvic floor PT  Overactive bladder - more bothersome - continue mirabegron due to improvement of symptoms, pt desires to continue - consider trial of gemtesa if inadequate relief on mirabegron - provided handout and reviewed cystoscopy at the time of SUI treatment due to history of vaginal sling 20 years ago to rule out mesh complication if persistent symptoms -  pending pelvic floor PT - considering bladder botox injections if refractory to medications and PFPT - UDS with increased sensation, decreased MCC consistent with bladder pain syndrome and OAB  SUI - minimal symptoms and pt reports history of vaginal mesh placement for leakage 20 years ago with resolution of urinary leakage - For treatment of stress urinary incontinence,  non-surgical options include expectant management, weight loss, physical therapy, as well as a  pessary.  Surgical options include a midurethral sling, Burch urethropexy, and transurethral injection of a bulking agent - will avoid abdominal procedures due to history of abdominal infection   Time spent: I spent 49 minutes dedicated to the care of this patient on the date of this encounter to include pre-visit review of records, face-to-face time with the patient discussing UDS results, bladder pain syndrome treatment, tobacco cessation, OAB treatments and post visit documentation.

## 2023-02-18 NOTE — Progress Notes (Unsigned)
Bladder Instillation: The patient was identified and verbally consented for the procedure.  The urethra was prepped with Betadine x 3. A 16 Fr foley catheter was inserted the bladder and drained for 5 cc. The foley was then attached to a 60 mL syringe with the plunger removed.  The medication was slowly poured into the bladder via the syringe and foley.  The medication consisted of: 20ml of Lidocaine 2%, 20mL of Bupivicaine 0.25%, 10,000 units/mL Heparin, 5mL Sodium Bicarbonate 8.4%.  The foley was removed and the patient was asked to hold the liquids in her bladder for 30-60 minutes if possible.   Precautions were given and patient was instructed to call the office or on-call number for any concerns.  Selmer Dominion, NP

## 2023-02-18 NOTE — Progress Notes (Unsigned)
[facility] Urodynamics Procedure  Referring Physician: Larae Grooms, NP Date of Procedure: 02/18/2023  Janet Murray is a 52 y.o. female who presents for urodynamic evaluation. Indication(s) for study: SUI, OAB, and mixed incontinence  Vital Signs: There were no vitals taken for this visit.  Laboratory Results: A catheterized urine specimen revealed:  POC urine:    Voiding Diary: Not done, she reports she forgot it at home.  Procedure Timeout:  The correct patient was verified and the correct procedure was verified. The patient was in the correct position and safety precautions were reviewed based on at the patient's history.  Urodynamic Procedure A 65F dual lumen urodynamics catheter was placed under sterile conditions into the patient's bladder. A 65F catheter was placed into the rectum in order to measure abdominal pressure. EMG patches were placed in the appropriate position.  All connections were confirmed and calibrations/adjusted made. Saline was instilled into the bladder through the dual lumen catheters.  Cough/valsalva pressures were measured periodically during filling.  Patient was allowed to void.  The bladder was then emptied of its residual.  UROFLOW: Revealed a Qmax of *** mL/sec.  She voided *** mL and had a residual of *** mL.  It was a {uroflow:24789} pattern and represented normal habits ***though interpretation limited due to low voided volume.***  CMG: This was performed with sterile water in the sitting position at a fill rate of 30*** mL/min.    First sensation of fullness was *** mLs,  First urge was *** mLs,  Strong urge was *** mLs and  Capacity was *** mLs  Stress incontinence {WAS/WAS NOT:9137063734::"was not"} demonstrated {highest/ lowest:24787} {Desc; negative/positive:13464} CLPP was *** cmH20 at *** ml. {highest/ lowest:24787} {Desc; negative/positive:13464} VLPP was *** cmH20at *** ml. {highest/ lowest:24787} {Desc;  negative/positive:13464} Barrier CLPP was *** cmH20 at *** ml. {highest/ lowest:24787} {Desc; negative/positive:13464} Barrier VLPP was *** cmH20 at *** ml.  Detrusor function was {Desc; normal/underactive/overactive:13463}, {With/no:32556} phasic contractions seen.  The first occurred at *** mL to *** cm of water and {WAS/WAS NOT:9137063734::"was not"} associated with {urge/ NWGN:56213}.  Compliance:  ***. End fill detrusor pressure was ***cmH20.  Calculated compliance was ***YQ/MVH84  UPP: MUCP {With/without:5700} barrier reduction was *** cm of water.    MICTURITION STUDY: Voiding was performed {reduction:24791} in the sitting position.  Pdet at Qmax was *** cm of water.  Qmax was *** mL/sec.  It was a {uroflow:24789} pattern.  She voided *** mL and had a residual of *** mL.  It was a volitional void, sustained detrusor contraction was {DESC; PRESENT/NOT PRESENT:21021351} and abdominal straining was {DESC; PRESENT/NOT PRESENT:21021351}  EMG: This was performed with patches.  She had voluntary contractions, recruitment with fill was {DESC; PRESENT/NOT PRESENT:21021351} and urethral sphincter was {relaxed:24792} with void.  The details of the procedure with the study tracings have been scanned into EPIC.   Urodynamic Impression:  1. Sensation was {DESC; NORMAL/REDUCED/ABSENT/INCREASED:23133}; capacity was {DESC; NORMAL/REDUCED/ABSENT/INCREASED:23133} 2. Stress Incontinence {WAS/WAS NOT:9137063734::"was not"} demonstrated at {ISD:24793} pressures; 3. Detrusor Overactivity {WAS/WAS NOT:9137063734::"was not"} demonstrated {With-without:32421} leakage. 4. Emptying was {dysfunctional:24794} with a {elevated:24795} PVR, a sustained detrusor contraction {DESC; PRESENT/NOT PRESENT:21021351},  abdominal straining {DESC; PRESENT/NOT PRESENT:21021351}, {dyssynergic:24796} urethral sphincter activity on EMG.  Plan: - The patient will follow up  to discuss the findings and treatment options.

## 2023-02-18 NOTE — Patient Instructions (Addendum)
Continue bladder instillation for your bladder pain syndrome in 1 week.   Continue mirabegron by mouth daily and please [place] know if you would like to try samples of another medication if you do not experience additional improvement from your urinary leakage and frequency symptoms.   Continue vaginal estrogen 0.5-1g twice a week.   Please schedule an appointment with pelvic floor PT when you receive a call for an appointment.   We will reassess your symptoms in 4 weeks and determine need for cystoscopy and possible bladder botox injections.

## 2023-02-23 ENCOUNTER — Ambulatory Visit: Payer: 59

## 2023-03-02 ENCOUNTER — Telehealth: Payer: 59 | Admitting: Student in an Organized Health Care Education/Training Program

## 2023-03-02 ENCOUNTER — Ambulatory Visit (INDEPENDENT_AMBULATORY_CARE_PROVIDER_SITE_OTHER): Payer: 59

## 2023-03-02 ENCOUNTER — Ambulatory Visit: Payer: 59

## 2023-03-02 VITALS — BP 124/75 | HR 65

## 2023-03-02 DIAGNOSIS — M62838 Other muscle spasm: Secondary | ICD-10-CM

## 2023-03-02 DIAGNOSIS — Z79891 Long term (current) use of opiate analgesic: Secondary | ICD-10-CM | POA: Diagnosis not present

## 2023-03-02 DIAGNOSIS — G894 Chronic pain syndrome: Secondary | ICD-10-CM | POA: Diagnosis not present

## 2023-03-02 MED ORDER — HEPARIN SODIUM (PORCINE) 10000 UNIT/ML IJ SOLN
10000.0000 [IU] | Freq: Once | INTRAMUSCULAR | Status: AC
Start: 2023-03-02 — End: 2023-03-02
  Administered 2023-03-02: 10000 [IU] via INTRAVESICAL

## 2023-03-02 MED ORDER — SODIUM BICARBONATE 8.4 % IV SOLN
5.0000 mL | Freq: Once | INTRAVENOUS | Status: AC
Start: 2023-03-02 — End: 2023-03-02
  Administered 2023-03-02: 5 mL

## 2023-03-02 MED ORDER — LIDOCAINE HCL 2 % IJ SOLN
20.0000 mL | Freq: Once | INTRAMUSCULAR | Status: AC
Start: 2023-03-02 — End: 2023-03-02
  Administered 2023-03-02: 400 mg

## 2023-03-02 MED ORDER — BUPIVACAINE HCL 0.25 % IJ SOLN
20.0000 mL | Freq: Once | INTRAMUSCULAR | Status: AC
Start: 2023-03-02 — End: 2023-03-02
  Administered 2023-03-02: 20 mL

## 2023-03-03 ENCOUNTER — Ambulatory Visit: Payer: 59

## 2023-03-04 ENCOUNTER — Ambulatory Visit
Payer: 59 | Attending: Student in an Organized Health Care Education/Training Program | Admitting: Student in an Organized Health Care Education/Training Program

## 2023-03-04 ENCOUNTER — Telehealth: Payer: Self-pay | Admitting: *Deleted

## 2023-03-04 DIAGNOSIS — M48062 Spinal stenosis, lumbar region with neurogenic claudication: Secondary | ICD-10-CM

## 2023-03-04 NOTE — Telephone Encounter (Signed)
Attempted to call for pre appt PPE. Message left.

## 2023-03-04 NOTE — Progress Notes (Signed)
I attempted to call the patient however no response. Voicemail left instructing patient to call front desk office at [phone] to reschedule appointment. -Dr Cherylann Ratel

## 2023-03-06 NOTE — Progress Notes (Signed)
Janet Murray is a 52 y.o. female here for a bladder instillation.

## 2023-03-09 ENCOUNTER — Ambulatory Visit: Payer: 59

## 2023-03-13 ENCOUNTER — Telehealth: Payer: Self-pay | Admitting: Nurse Practitioner

## 2023-03-13 NOTE — Telephone Encounter (Signed)
Patient called stated her Semaglutide-Weight Management 0.25 MG/0.5ML needs a prior auth through her insurance. Please f/u with patient as she says she has been trying to get this medication for 30 days

## 2023-03-16 ENCOUNTER — Ambulatory Visit: Payer: 59 | Admitting: Pulmonary Disease

## 2023-03-16 NOTE — Telephone Encounter (Signed)
She does not meet any of the requirements for this medication for the PA to be accepted based on what I've been told. Sadly it does not appear that her insurance will cover Wegovy or Zepbound or Victoza. If her insurance changes we can try again

## 2023-03-16 NOTE — Telephone Encounter (Signed)
Routing to provider to advise. Please see previous phone encounters. PA for [facility] was denied. Per pharmacist with patient's insurance company, medication will not be covered without a diabetes diagnosis.   Please advise on next steps for the patient.

## 2023-03-17 NOTE — Telephone Encounter (Signed)
Called and notified patient of providers message. Also reviewed patient's chart and advised patient on what the pharmacy department with [facility] said regarding these medications.

## 2023-03-18 ENCOUNTER — Ambulatory Visit: Payer: 59 | Admitting: Obstetrics

## 2023-03-30 DIAGNOSIS — Z79891 Long term (current) use of opiate analgesic: Secondary | ICD-10-CM | POA: Diagnosis not present

## 2023-03-30 DIAGNOSIS — M62830 Muscle spasm of back: Secondary | ICD-10-CM | POA: Diagnosis not present

## 2023-03-30 DIAGNOSIS — G9050 Complex regional pain syndrome I, unspecified: Secondary | ICD-10-CM | POA: Diagnosis not present

## 2023-04-20 ENCOUNTER — Telehealth: Payer: Self-pay | Admitting: Nurse Practitioner

## 2023-04-20 DIAGNOSIS — Z6834 Body mass index (BMI) 34.0-34.9, adult: Secondary | ICD-10-CM

## 2023-04-20 MED ORDER — TIRZEPATIDE 2.5 MG/0.5ML ~~LOC~~ SOAJ: 2.5000 mg | SUBCUTANEOUS | 0 refills | Status: DC

## 2023-04-20 NOTE — Telephone Encounter (Signed)
Routing to provider for possible medication change.

## 2023-04-20 NOTE — Telephone Encounter (Signed)
Dawn at [employer] would like the diagnosis code for [employer]. Eydie woul like a call when complete.Please advise

## 2023-04-20 NOTE — Telephone Encounter (Signed)
Janet Murray is Victoza which her insurance doesn't cover.    I will send in mounjaro but it likely won't be covered due to her not being diabetic.  The insurance company will often say they cover the medication but leave out the fact that the patient has to be diabetic.

## 2023-04-20 NOTE — Telephone Encounter (Signed)
Pt stated her insurance called her and advised her they will not pay for [employer] but will pay alternative of Mounjaro and/or Saxenda  New PA is needed.  Please advise.

## 2023-04-21 MED ORDER — TIRZEPATIDE 2.5 MG/0.5ML ~~LOC~~ SOAJ: 2.5000 mg | SUBCUTANEOUS | 0 refills | Status: DC

## 2023-04-21 NOTE — Telephone Encounter (Signed)
Medication sent to the pharmacy with diagnosis code.

## 2023-04-22 ENCOUNTER — Ambulatory Visit (INDEPENDENT_AMBULATORY_CARE_PROVIDER_SITE_OTHER): Payer: 59 | Admitting: Nurse Practitioner

## 2023-04-22 ENCOUNTER — Encounter: Payer: Self-pay | Admitting: Nurse Practitioner

## 2023-04-22 VITALS — BP 101/68 | HR 69 | Temp 98.5°F | Ht 61.0 in | Wt 196.8 lb

## 2023-04-22 DIAGNOSIS — J439 Emphysema, unspecified: Secondary | ICD-10-CM | POA: Diagnosis not present

## 2023-04-22 DIAGNOSIS — E559 Vitamin D deficiency, unspecified: Secondary | ICD-10-CM | POA: Insufficient documentation

## 2023-04-22 DIAGNOSIS — E039 Hypothyroidism, unspecified: Secondary | ICD-10-CM

## 2023-04-22 NOTE — Assessment & Plan Note (Signed)
Chronic, controlled. She is currently taking Levothyroxine 175 mcg PO every day and appears to be tolerating well  Recheck thyroid panel today- results to dictate further management  Continue current regimen pending labs Follow up in 6 months or sooner if concerns arise

## 2023-04-22 NOTE — Progress Notes (Signed)
BP 101/68 (BP Location: Left Arm, Patient Position: Sitting, Cuff Size: Large)   Pulse 69   Temp 98.5 F (36.9 C) (Oral)   Ht 5\' 1"  (1.549 m)   Wt 196 lb 12.8 oz (89.3 kg)   SpO2 97%   BMI 37.19 kg/m    Subjective:    Patient ID: Janet Murray, female    DOB: 1970-07-18, 52 y.o.   MRN: [id number]  HPI: [name] is a 52 y.o. female  Chief Complaint  Patient presents with   Thyroid check   Weight Management Screening    Janet Murray   COPD COPD status: stable Satisfied with current treatment?: yes Oxygen use: no Dyspnea frequency: not often  Cough frequency: very infrequent  Rescue inhaler frequency:  about 1-2 times per week  Limitation of activity:  does not feel limited very much  Productive cough:  Last Spirometry:  Pneumovax: unknown Influenza: Up to Date   HYPERLIPIDEMIA/ CAD Hyperlipidemia status: good compliance Satisfied with current treatment?  yes Side effects:  no Medication compliance: good compliance Past cholesterol meds: atorvastain (lipitor) Supplements: none Aspirin:  no The 10-year ASCVD risk score (Arnett DK, et al., 2019) is: 4.6%   Values used to calculate the score:     Age: 49 years     Sex: Female     Is Non-Hispanic African American: No     Diabetic: Yes     Tobacco smoker: Yes     Systolic Blood Pressure: 94 mmHg     Is BP treated: No     HDL Cholesterol: 56 mg/dL     Total Cholesterol: 220 mg/dL Chest pain:  no Coronary artery disease:  yes   HYPOTHYROIDISM Thyroid control status:stable Satisfied with current treatment? yes Medication side effects: no Medication compliance: good compliance Etiology of hypothyroidism:  Recent dose adjustment:no Fatigue: no Cold intolerance: no Heat intolerance: no Weight gain: yes Weight loss: no Constipation: no Diarrhea/loose stools:  no Palpitations: no Lower extremity edema: no Anxiety/depressed mood: no   Relevant past medical, surgical, family and social history reviewed and  updated as indicated. Interim medical history since our last visit reviewed. Allergies and medications reviewed and updated.  Review of Systems  Constitutional:  Negative for fatigue and unexpected weight change.  Cardiovascular:  Negative for palpitations and leg swelling.  Gastrointestinal:  Negative for constipation and diarrhea.  Endocrine: Negative for cold intolerance and heat intolerance.  Psychiatric/Behavioral:  Negative for dysphoric mood. The patient is not nervous/anxious.     Per HPI unless specifically indicated above     Objective:    BP 101/68 (BP Location: Left Arm, Patient Position: Sitting, Cuff Size: Large)   Pulse 69   Temp 98.5 F (36.9 C) (Oral)   Ht 5\' 1"  (1.549 m)   Wt 196 lb 12.8 oz (89.3 kg)   SpO2 97%   BMI 37.19 kg/m   Wt Readings from Last 3 Encounters:  04/22/23 196 lb 12.8 oz (89.3 kg)  02/10/23 188 lb 6.4 oz (85.5 kg)  01/26/23 186 lb 3.2 oz (84.5 kg)    Physical Exam Vitals and nursing note reviewed.  Constitutional:      General: She is not in acute distress.    Appearance: Normal appearance. She is not ill-appearing, toxic-appearing or diaphoretic.  HENT:     Head: Normocephalic.     Right Ear: External ear normal.     Left Ear: External ear normal.     Nose: Nose normal.  Mouth/Throat:     Mouth: Mucous membranes are moist.     Pharynx: Oropharynx is clear.  Eyes:     General:        Right eye: No discharge.        Left eye: No discharge.     Extraocular Movements: Extraocular movements intact.     Conjunctiva/sclera: Conjunctivae normal.     Pupils: Pupils are equal, round, and reactive to light.  Cardiovascular:     Rate and Rhythm: Normal rate and regular rhythm.     Heart sounds: No murmur heard. Pulmonary:     Effort: Pulmonary effort is normal. No respiratory distress.     Breath sounds: Normal breath sounds. No wheezing or rales.  Musculoskeletal:     Cervical back: Normal range of motion and neck supple.   Skin:    General: Skin is warm and dry.     Capillary Refill: Capillary refill takes less than 2 seconds.  Neurological:     General: No focal deficit present.     Mental Status: She is alert and oriented to person, place, and time. Mental status is at baseline.  Psychiatric:        Mood and Affect: Mood normal.        Behavior: Behavior normal.        Thought Content: Thought content normal.        Judgment: Judgment normal.     Results for orders placed or performed in visit on 02/18/23  POCT urinalysis dipstick  Result Value Ref Range   Color, UA Yellow    Clarity, UA Clear    Glucose, UA Negative Negative   Bilirubin, UA Negative    Ketones, UA Negative    Spec Grav, UA 1.015 1.010 - 1.025   Blood, UA Negative    pH, UA 6.0 5.0 - 8.0   Protein, UA Positive (A) Negative   Urobilinogen, UA 0.2 0.2 or 1.0 E.U./dL   Nitrite, UA Negative    Leukocytes, UA Negative Negative   Appearance     Odor        Assessment & Plan:   Problem List Items Addressed This Visit       Respiratory   COPD (chronic obstructive pulmonary disease) with emphysema (HCC)    Chronic.  Controlled.  Continue with current medication regimen.  Followed by Pulmonology, Dr. Jayme Cloud.  Labs ordered today.  Return to clinic in 6 months for reevaluation.  Call sooner if concerns arise.          Endocrine   Acquired hypothyroidism - Primary    Chronic, controlled. She is currently taking Levothyroxine 175 mcg PO every day and appears to be tolerating well  Recheck thyroid panel today- results to dictate further management  Continue current regimen pending labs Follow up in 6 months or sooner if concerns arise       Relevant Orders   Thyroid Panel With TSH     Other   Vitamin D deficiency    Labs ordered at visit today.  Will make recommendations based on lab results.        Relevant Orders   Vitamin D (25 hydroxy)     Follow up plan: Return in about 3 months (around 07/23/2023) for  HTN, HLD, DM2 FU.

## 2023-04-22 NOTE — Assessment & Plan Note (Signed)
Labs ordered at visit today.  Will make recommendations based on lab results.   

## 2023-04-22 NOTE — Telephone Encounter (Signed)
error 

## 2023-04-22 NOTE — Assessment & Plan Note (Signed)
Chronic.  Controlled.  Continue with current medication regimen.  Followed by Pulmonology, Dr. Patsey Berthold.  Labs ordered today.  Return to clinic in 6 months for reevaluation.  Call sooner if concerns arise.

## 2023-04-22 NOTE — Telephone Encounter (Signed)
Patient is scheduled with provider today.

## 2023-04-23 LAB — THYROID PANEL WITH TSH
Free Thyroxine Index: 1.1 — ABNORMAL LOW (ref 1.2–4.9)
T3 Uptake Ratio: 20 % — ABNORMAL LOW (ref 24–39)
T4, Total: 5.7 ug/dL (ref 4.5–12.0)
TSH: 56.4 u[IU]/mL — ABNORMAL HIGH (ref 0.450–4.500)

## 2023-04-23 LAB — VITAMIN D 25 HYDROXY (VIT D DEFICIENCY, FRACTURES): Vit D, 25-Hydroxy: 18.1 ng/mL — ABNORMAL LOW (ref 30.0–100.0)

## 2023-04-23 IMAGING — US US THYROID
1 series · 12 of 25 positions shown · non-contrast
Comparison: 09/03/2012

CLINICAL DATA: Evaluate thyroid nodules.

EXAM:
THYROID ULTRASOUND
TECHNIQUE: Ultrasound examination of the thyroid gland and adjacent soft
tissues was performed.

[Series 1: us thyroid · 0.07mm/px · 52 acquisitions, 12 frames shown]
[im 3/52]
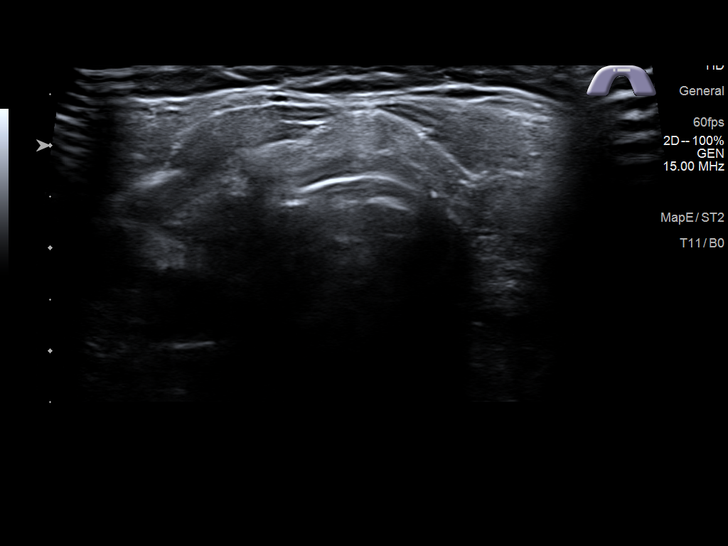
[im 7/52]
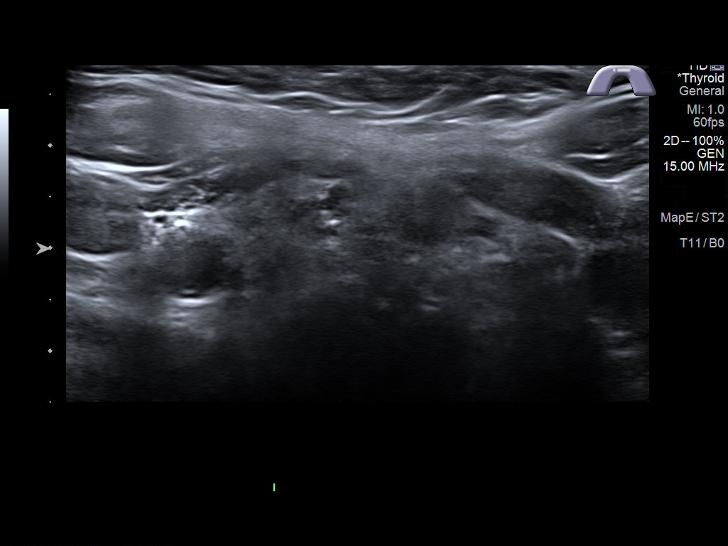
[im 11/52]
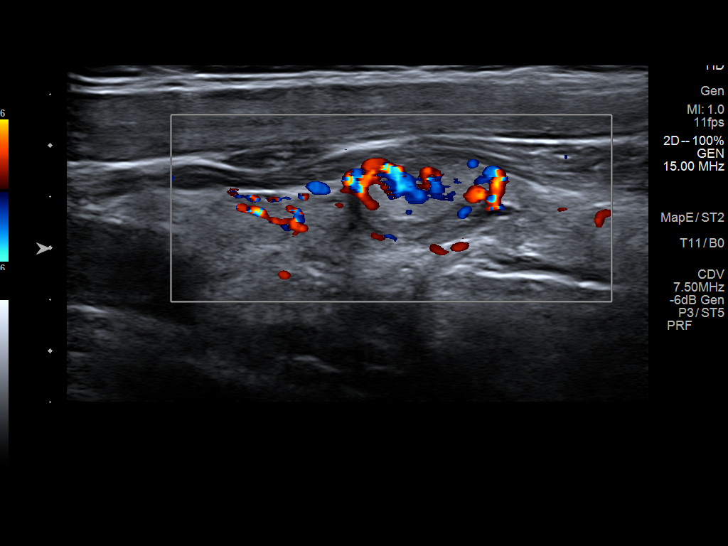
[im 15/52]
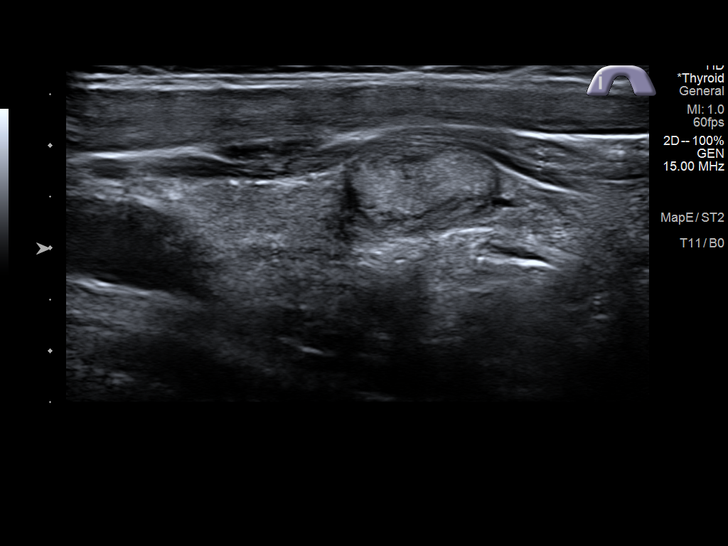
[im 20/52]
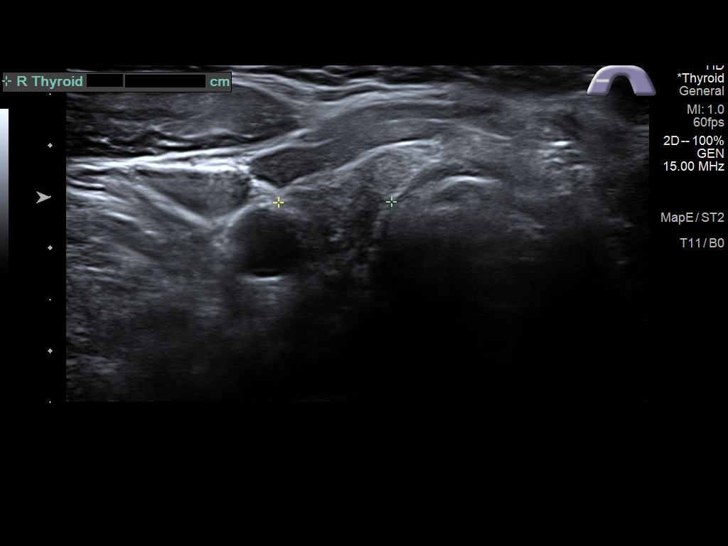
[im 24/52]
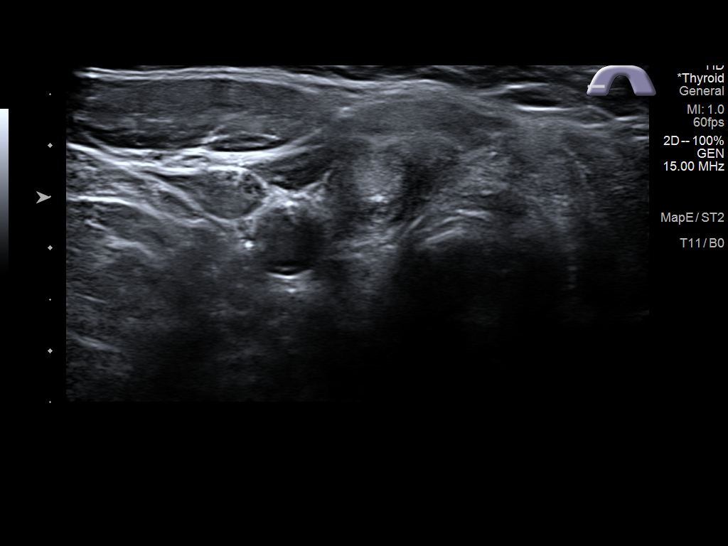
[im 28/52]
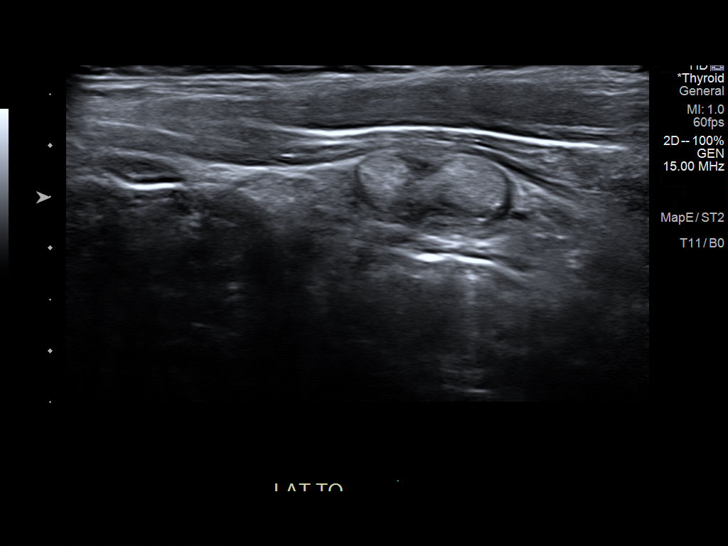
[im 32/52]
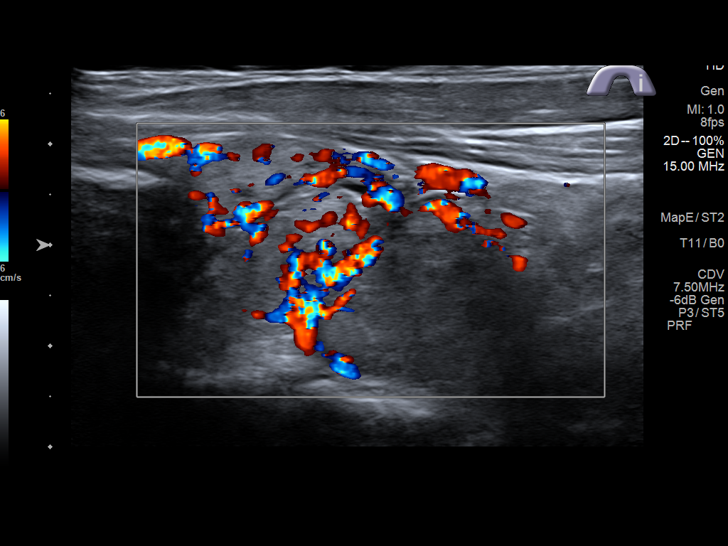
[im 37/52]
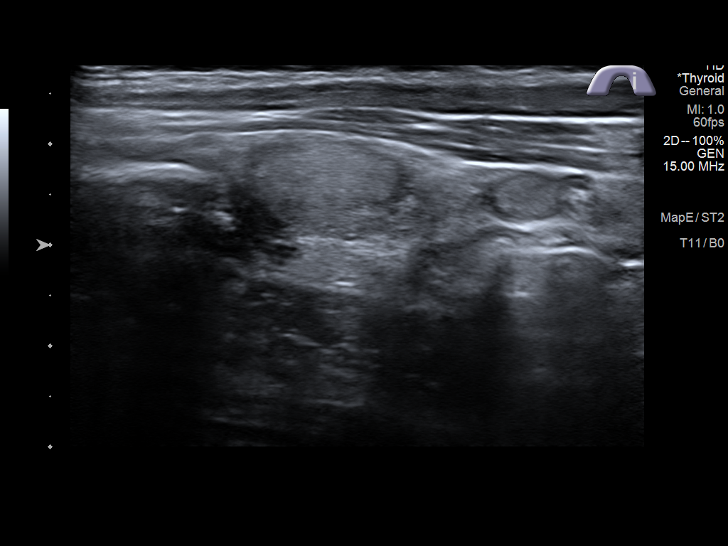
[im 41/52]
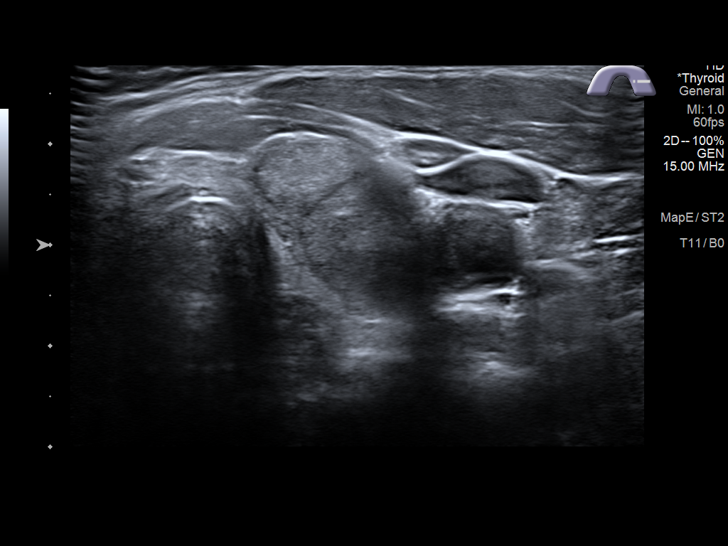
[im 45/52]
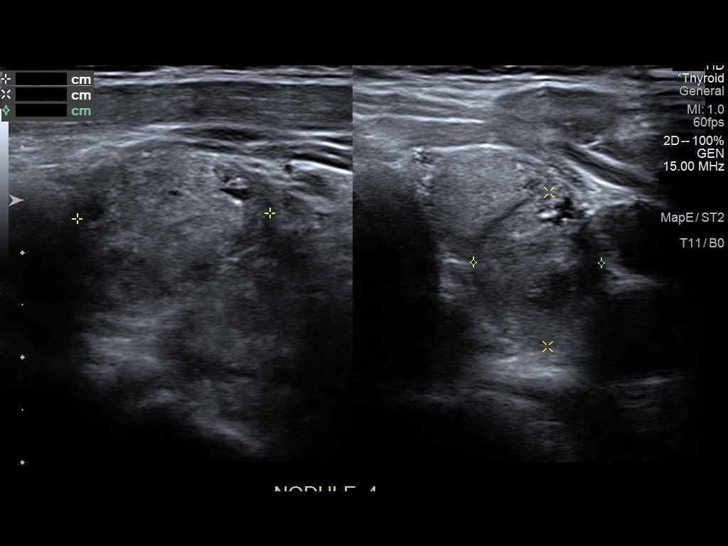
[im 49/52]
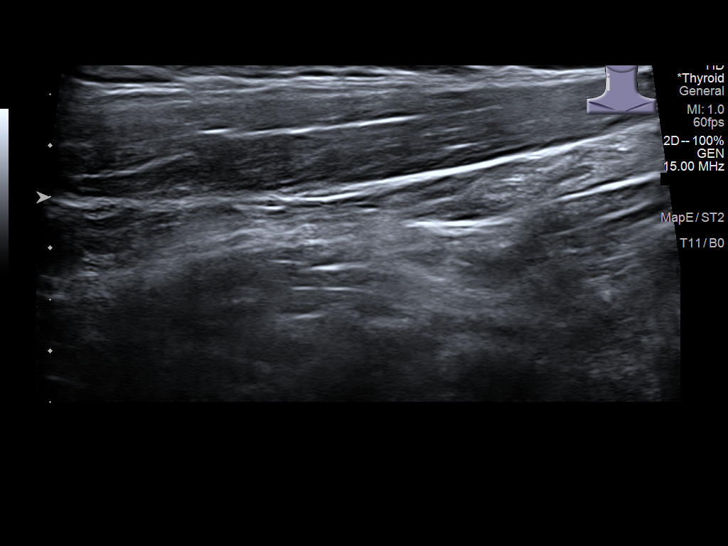

[12 of 25 positions shown; findings below may reference images not displayed]

FINDINGS: Parenchymal Echotexture: Mildly heterogenous

Isthmus: 0.5 cm

Right lobe: 3.6 x 1.0 x 1.1 cm

Left lobe: 3.7 x 2.2 x 1.6 cm

_________________________________________________________

Estimated total number of nodules >/= 1 cm: 4

Number of spongiform nodules >/=  2 cm not described below (TR1): 0

Number of mixed cystic and solid nodules >/= 1.5 cm not described
below (TR2): 0

_________________________________________________________

Nodule # 1:

Location: Right; Mid

Maximum size: 1.5 cm; Other 2 dimensions: 0.7 x 0.7 cm

Composition: solid/almost completely solid (2)

Echogenicity: isoechoic (1)

Shape: not taller-than-wide (0)

Margins: smooth (0)

Echogenic foci: none (0)

ACR TI-RADS total points: 3.

ACR TI-RADS risk category: TR3 (3 points).

ACR TI-RADS recommendations:

*Given size (>/= 1.5 - 2.4 cm) and appearance, a follow-up
ultrasound in 1 year should be considered based on TI-RADS criteria.

_________________________________________________________

Nodule 3 is an isoechoic nodule in the left superior thyroid lobe.
This nodule measures 1.2 x 0.9 x 1.0 cm. This is a TR 3 nodule.
Given size (<1.4 cm) and appearance, this nodule does NOT meet
TI-RADS criteria for biopsy or dedicated follow-up.

_________________________________________________________

Nodule # 4:

Prior biopsy: No

Location: Left; Mid

Maximum size: 1.8 cm; Other 2 dimensions: 1.5 x 1.2 cm, previously,
1.9 x 1.5 x 1.3 cm

Composition: solid/almost completely solid (2)

Echogenicity: isoechoic (1)

Shape: taller-than-wide (3)

Margins: ill-defined (0)

Echogenic foci: none (0)

ACR TI-RADS total points: 6.

ACR TI-RADS risk category:  TR4 (4-6 points).

Significant change in size (>/= 20% in two dimensions and minimal
increase of 2 mm): No

Change in features: No

Change in ACR TI-RADS risk category: No

ACR TI-RADS recommendations:

**Given size (>/= 1.5 cm) and appearance, fine needle aspiration of
this moderately suspicious nodule should be considered based on
TI-RADS criteria.

_________________________________________________________

Nodule # 5:

Prior biopsy: No

Location: Left; Inferior

Maximum size: 1.9 cm; Other 2 dimensions: 1.5 x 1.3 cm, previously,
1.3 x 1.1 x 1.0 cm

Composition: solid/almost completely solid (2)

Echogenicity: isoechoic (1)

Shape: taller-than-wide (3)

Margins: ill-defined (0)

Echogenic foci: none (0)

ACR TI-RADS total points: 6.

ACR TI-RADS risk category:  TR4 (4-6 points).

Significant change in size (>/= 20% in two dimensions and minimal
increase of 2 mm): Yes

Change in features: No

Change in ACR TI-RADS risk category: No

ACR TI-RADS recommendations:

**Given size (>/= 1.5 cm) and appearance, fine needle aspiration of
this moderately suspicious nodule should be considered based on
TI-RADS criteria.

_________________________________________________________

Nodule 6 is an isoechoic nodule on the left side of the isthmus that
measures up to 0.9 cm. This is a TR 3 nodule. Given size (<1.4 cm)
and appearance, this nodule does NOT meet TI-RADS criteria for
biopsy or dedicated follow-up.
IMPRESSION: 1. Bilateral thyroid nodules.
2. Nodule #5 in the left inferior thyroid lobe has enlarged and this
nodule meets criteria for ultrasound-guided biopsy. Recommend biopsy
of this nodule.
3. Nodule #4 in the left mid thyroid lobe meets criteria for
ultrasound-guided biopsy. This nodule may have minimally changed
since 0464 but difficult to be certain based on the prior
examination. Therefore, consider biopsy of this nodule.
4. Nodule #1 in the right mid thyroid lobe is a TR 3 nodule and
meets criteria for 1 year follow-up.

The above is in keeping with the ACR TI-RADS recommendations - [HOSPITAL] 8816;[DATE].

## 2023-04-23 MED ORDER — LEVOTHYROXINE SODIUM 200 MCG PO TABS: 200.0000 ug | ORAL_TABLET | Freq: Every day | ORAL | 1 refills | Status: DC

## 2023-04-23 NOTE — Addendum Note (Signed)
Addended by: Larae Grooms on: 04/23/2023 12:42 PM   Modules accepted: Orders

## 2023-04-27 DIAGNOSIS — G9050 Complex regional pain syndrome I, unspecified: Secondary | ICD-10-CM | POA: Diagnosis not present

## 2023-05-21 ENCOUNTER — Other Ambulatory Visit: Payer: Self-pay | Admitting: Nurse Practitioner

## 2023-05-21 DIAGNOSIS — Z6834 Body mass index (BMI) 34.0-34.9, adult: Secondary | ICD-10-CM

## 2023-05-21 NOTE — Telephone Encounter (Signed)
Requested medication (s) are due for refill today: Yes  Requested medication (s) are on the active medication list: Yes  Last refill:  08/07/22  Future visit scheduled: Yes  Notes to clinic:  Last filled by Dr. Marcos Eke.    Requested Prescriptions  Pending Prescriptions Disp Refills   albuterol (VENTOLIN HFA) 108 (90 Base) MCG/ACT inhaler [Pharmacy Med Name: ALBUTEROL SULFATE HFA 108 (90 BASE)] 8.5 g 4    Sig: INHALE 2 PUFFS INTO THE LUNGS EVERY 6 HOURS AS NEEDED FOR WHEEZING OR SHORTNESS OF BREATH     Pulmonology:  Beta Agonists 2 Passed - 05/21/2023  1:04 PM      Passed - Last BP in normal range    BP Readings from Last 1 Encounters:  04/22/23 101/68         Passed - Last Heart Rate in normal range    Pulse Readings from Last 1 Encounters:  04/22/23 69         Passed - Valid encounter within last 12 months    Recent Outpatient Visits           4 weeks ago Acquired hypothyroidism   [place] [facility] Larae Grooms, NP   4 months ago Pulmonary emphysema, unspecified emphysema type (HCC)   [place] [facility] Mecum, Erin E, PA-C   5 months ago Pulmonary emphysema, unspecified emphysema type (HCC)   [place] [facility] [place], Corrie Dandy T, NP   9 months ago Chest pain, unspecified type   [place] [facility] [place], Megan P, DO   10 months ago Encounter for annual wellness exam in Medicare patient   [place] [facility] Larae Grooms, NP       Future Appointments             In 1 month Larae Grooms, NP [place] [facility], [facility]   In 2 months Larae Grooms, NP [place] [facility], [facility]

## 2023-05-21 NOTE — Telephone Encounter (Signed)
Requested medications are due for refill today.  yes  Requested medications are on the active medications list.  yes  Last refill. 04/21/2023 2mL 0rf  Future visit scheduled.   yes  Notes to clinic.  Medication not assigned to a protocol. Please review for refill.    Requested Prescriptions  Pending Prescriptions Disp Refills   MOUNJARO 2.5 MG/0.5ML Pen [Pharmacy Med Name: MOUNJARO 2.5 MG/0.5ML SUBQ SOLN ML] 2 mL 0    Sig: INJECT 2.5 MG INTO THE SKIN ONCE A WEEK     Off-Protocol Failed - 05/21/2023  1:01 PM      Failed - Medication not assigned to a protocol, review manually.      Passed - Valid encounter within last 12 months    Recent Outpatient Visits           4 weeks ago Acquired hypothyroidism   [place] [facility] Larae Grooms, NP   4 months ago Pulmonary emphysema, unspecified emphysema type (HCC)   [place] [facility] Mecum, Erin E, PA-C   5 months ago Pulmonary emphysema, unspecified emphysema type (HCC)   [place] [facility] [place], Corrie Dandy T, NP   9 months ago Chest pain, unspecified type   [place] [facility] [place], Megan P, DO   10 months ago Encounter for annual wellness exam in Medicare patient   [place] [facility] Larae Grooms, NP       Future Appointments             In 1 month Larae Grooms, NP [place] [facility], [facility]   In 2 months Larae Grooms, NP [facility], [facility]

## 2023-05-25 ENCOUNTER — Other Ambulatory Visit: Payer: Self-pay | Admitting: Nurse Practitioner

## 2023-05-25 DIAGNOSIS — Z79891 Long term (current) use of opiate analgesic: Secondary | ICD-10-CM | POA: Diagnosis not present

## 2023-05-25 DIAGNOSIS — Z6834 Body mass index (BMI) 34.0-34.9, adult: Secondary | ICD-10-CM

## 2023-05-25 DIAGNOSIS — G894 Chronic pain syndrome: Secondary | ICD-10-CM | POA: Diagnosis not present

## 2023-05-25 DIAGNOSIS — Z5181 Encounter for therapeutic drug level monitoring: Secondary | ICD-10-CM | POA: Diagnosis not present

## 2023-05-25 DIAGNOSIS — M5481 Occipital neuralgia: Secondary | ICD-10-CM | POA: Diagnosis not present

## 2023-05-25 NOTE — Telephone Encounter (Signed)
Medication Refill -  Most Recent Primary Care Visit:  Provider: Larae Grooms  Department: [facility]  Visit Type: OFFICE VISIT  Date: 04/22/2023  Medication: tirzepatide [facility]) 2.5 MG/0.5ML Pen   Has the patient contacted their pharmacy? Yes   Is this the correct pharmacy for this prescription? Yes If no, delete pharmacy and type the correct one.  This is the patient's preferred pharmacy:  [facility] - [place], [place] - [address] [place] [address] Phone: [phone] Fax: [phone]   Has the prescription been filled recently? No  Is the patient out of the medication? No but she is running low  Has the patient been seen for an appointment in the last year OR does the patient have an upcoming appointment? Yes  Can we respond through MyChart? Yes  Please assist patient further

## 2023-05-26 ENCOUNTER — Other Ambulatory Visit: Payer: Self-pay | Admitting: Nurse Practitioner

## 2023-05-26 DIAGNOSIS — Z6834 Body mass index (BMI) 34.0-34.9, adult: Secondary | ICD-10-CM

## 2023-05-26 NOTE — Telephone Encounter (Unsigned)
Copied from CRM [id number]. Topic: General - Other >> May 26, 2023  4:44 PM Everette C wrote: Reason for CRM: Medication Refill - Most Recent Primary Care Visit:  Provider: Larae Grooms Department: [facility] Visit Type: OFFICE VISIT Date: 04/22/2023  Medication: tirzepatide [facility]) 2.5 MG/0.5ML Pen [[id number]]  Has the patient contacted their pharmacy? Yes (Agent: If no, request that the patient contact the pharmacy for the refill. If patient does not wish to contact the pharmacy document the reason why and proceed with request.) (Agent: If yes, when and what did the pharmacy advise?)  Is this the correct pharmacy for this prescription? Yes If no, delete pharmacy and type the correct one.  This is the patient's preferred pharmacy:  [facility] - [place], [place] - [address] [place] [address] Phone: [phone] Fax: [phone]   Has the prescription been filled recently? Yes  Is the patient out of the medication? Yes  Has the patient been seen for an appointment in the last year OR does the patient have an upcoming appointment? Yes  Can we respond through MyChart? No  Agent: Please be advised that Rx refills may take up to 3 business days. We ask that you follow-up with your pharmacy.

## 2023-05-26 NOTE — Telephone Encounter (Signed)
Requested medication (s) are due for refill today: Yes  Requested medication (s) are on the active medication list: Yes  Last refill:  04/21/23  Future visit scheduled: Yes  Notes to clinic:  Manual review.    Requested Prescriptions  Pending Prescriptions Disp Refills   tirzepatide (MOUNJARO) 2.5 MG/0.5ML Pen 2 mL 0    Sig: Inject 2.5 mg into the skin once a week.     Off-Protocol Failed - 05/26/2023 11:27 AM      Failed - Medication not assigned to a protocol, review manually.      Passed - Valid encounter within last 12 months    Recent Outpatient Visits           1 month ago Acquired hypothyroidism   [place] [facility] Larae Grooms, NP   4 months ago Pulmonary emphysema, unspecified emphysema type (HCC)   [place] [facility] Mecum, Erin E, PA-C   5 months ago Pulmonary emphysema, unspecified emphysema type (HCC)   [place] [facility] [place], Corrie Dandy T, NP   9 months ago Chest pain, unspecified type   [place] [facility] [place], Megan P, DO   10 months ago Encounter for annual wellness exam in Medicare patient   [place] [facility] Larae Grooms, NP       Future Appointments             In 1 month Larae Grooms, NP [facility], [facility]   In 1 month Larae Grooms, NP [place] [facility], [facility]

## 2023-05-27 NOTE — Telephone Encounter (Signed)
Requested medication (s) are due for refill today - yes  Requested medication (s) are on the active medication list -yes  Future visit scheduled -yes  Last refill: 04/21/23 2ml  Notes to clinic: off protocol- provider review   Requested Prescriptions  Pending Prescriptions Disp Refills   tirzepatide (MOUNJARO) 2.5 MG/0.5ML Pen 2 mL 0    Sig: Inject 2.5 mg into the skin once a week.     Off-Protocol Failed - 05/27/2023  9:09 AM      Failed - Medication not assigned to a protocol, review manually.      Passed - Valid encounter within last 12 months    Recent Outpatient Visits           1 month ago Acquired hypothyroidism   [place] [facility] Larae Grooms, NP   4 months ago Pulmonary emphysema, unspecified emphysema type (HCC)   [place] [facility] Mecum, Erin E, PA-C   5 months ago Pulmonary emphysema, unspecified emphysema type (HCC)   [place] [facility] [place], Corrie Dandy T, NP   9 months ago Chest pain, unspecified type   [place] [facility] [place], Megan P, DO   10 months ago Encounter for annual wellness exam in Medicare patient   [place] [facility] Larae Grooms, NP       Future Appointments             In 1 month Larae Grooms, NP [place] [facility], [facility]   In 1 month Larae Grooms, NP [place] [facility], [facility]               Requested Prescriptions  Pending Prescriptions Disp Refills   tirzepatide [facility]) 2.5 MG/0.5ML Pen 2 mL 0    Sig: Inject 2.5 mg into the skin once a week.     Off-Protocol Failed - 05/27/2023  9:09 AM      Failed - Medication not assigned to a protocol, review manually.      Passed - Valid encounter within last 12 months    Recent Outpatient Visits           1 month ago Acquired hypothyroidism   [place] [facility] Larae Grooms, NP   4 months ago Pulmonary emphysema,  unspecified emphysema type (HCC)   [place] [facility] Mecum, Erin E, PA-C   5 months ago Pulmonary emphysema, unspecified emphysema type (HCC)   [place] [facility] [place], Corrie Dandy T, NP   9 months ago Chest pain, unspecified type   [place] [facility] [place], Megan P, DO   10 months ago Encounter for annual wellness exam in Medicare patient   [place] [facility] Larae Grooms, NP       Future Appointments             In 1 month Larae Grooms, NP [place] [facility], [facility]   In 1 month Larae Grooms, NP  [facility], [facility]

## 2023-06-04 ENCOUNTER — Telehealth: Payer: Self-pay | Admitting: Nurse Practitioner

## 2023-06-04 ENCOUNTER — Other Ambulatory Visit: Payer: 59

## 2023-06-04 DIAGNOSIS — E039 Hypothyroidism, unspecified: Secondary | ICD-10-CM | POA: Diagnosis not present

## 2023-06-04 NOTE — Telephone Encounter (Signed)
Pt came in wanting to know what was going on with her request for refill for [facility].  It seems to be not covered under patient's insurance because she does not have DM.  I explained this to her and she said she knew that last time the provider got it approved by sending a bunch of paperwork over.  She then stated that she spoke with [place] and they advised her to get the provider to send a prescription to the pharmacy and to send all the paperwork that you sent last month so it can get approved.  Please advise.

## 2023-06-04 NOTE — Telephone Encounter (Signed)
Prior Authorization Request   Type of Request   [x]   Original (Date submitted: 06/04/2023  []   Re-submit after denial Date submitted:   []   Appeal Date submitted:   []   Second level appeal Date submitted:   Medication Name and Strength: Mounjaro 2.5 mg               Rx Insurance Information to which PA Submitted:     Rx [employer] Name: [employer] RX  []  No PA required, above issue resolved and patient can receive medication  []  Patient notified on (date)  Completed PA Details:  DIAGNOSIS USED TO SUBMIT PA: CAD I25.10  Completed PA authorization submitted via:    [x]    CoverMyMeds website on mm/dd/yyyy CMM Key: [id number]

## 2023-06-05 LAB — TSH: TSH: 8.88 u[IU]/mL — ABNORMAL HIGH (ref 0.450–4.500)

## 2023-06-05 LAB — T4, FREE: Free T4: 1.43 ng/dL (ref 0.82–1.77)

## 2023-06-08 ENCOUNTER — Other Ambulatory Visit: Payer: Self-pay | Admitting: Nurse Practitioner

## 2023-06-08 DIAGNOSIS — Z6834 Body mass index (BMI) 34.0-34.9, adult: Secondary | ICD-10-CM

## 2023-06-08 DIAGNOSIS — K219 Gastro-esophageal reflux disease without esophagitis: Secondary | ICD-10-CM

## 2023-06-08 MED ORDER — TIRZEPATIDE 5 MG/0.5ML ~~LOC~~ SOAJ
5.0000 mg | SUBCUTANEOUS | 1 refills | Status: DC
Start: 2023-06-08 — End: 2023-06-30

## 2023-06-08 NOTE — Telephone Encounter (Signed)
Pharmacist from [employer] calling requesting to the dx code for [facility] .    [facility] - [place], [place] - [address] Phone: [phone]  Fax: [phone]

## 2023-06-08 NOTE — Telephone Encounter (Signed)
Insurance states no PA is required and since no current rx on file pharmacy is unable to see what rejection they might get. Do you want to fill again or does she need to be seen.

## 2023-06-08 NOTE — Telephone Encounter (Signed)
Mounjaro 5 mg sent to the pharmacy

## 2023-06-08 NOTE — Telephone Encounter (Signed)
Called and provided the pharmacy with the diagnosis code for obesity as patient does not have diabetes.

## 2023-06-09 ENCOUNTER — Other Ambulatory Visit: Payer: Self-pay | Admitting: Nurse Practitioner

## 2023-06-09 DIAGNOSIS — K219 Gastro-esophageal reflux disease without esophagitis: Secondary | ICD-10-CM

## 2023-06-09 MED ORDER — ESOMEPRAZOLE MAGNESIUM 40 MG PO CPDR: 40.0000 mg | DELAYED_RELEASE_CAPSULE | Freq: Two times a day (BID) | ORAL | 0 refills | Status: DC

## 2023-06-12 NOTE — Telephone Encounter (Signed)
 Requested Prescriptions  Refused Prescriptions Disp Refills   esomeprazole  (NEXIUM ) 40 MG capsule [Pharmacy Med Name: ESOMEPRAZOLE  MAGNESIUM  40 MG CAP] 180 capsule 0    Sig: TAKE 1 CAPSULE BY MOUTH TWICE DAILY     Gastroenterology: Proton Pump Inhibitors 2 Passed - 06/12/2023  9:53 AM      Passed - ALT in normal range and within 360 days    ALT  Date Value Ref Range Status  01/12/2023 7 0 - 32 IU/L Final   SGPT (ALT)  Date Value Ref Range Status  10/29/2013 13 12 - 78 U/L Final         Passed - AST in normal range and within 360 days    AST  Date Value Ref Range Status  01/12/2023 11 0 - 40 IU/L Final   SGOT(AST)  Date Value Ref Range Status  10/29/2013 17 15 - 37 Unit/L Final         Passed - Valid encounter within last 12 months    Recent Outpatient Visits           1 month ago Acquired hypothyroidism   [place] [facility] Melvin Pao, NP   5 months ago Pulmonary emphysema, unspecified emphysema type (HCC)   [place] [facility] Mecum, Erin E, PA-C   5 months ago Pulmonary emphysema, unspecified emphysema type (HCC)   [place] [facility] [place], Melanie T, NP   9 months ago Chest pain, unspecified type   [place] [facility] [place], Megan P, DO   11 months ago Encounter for annual wellness exam in Medicare patient   [place] [facility] Melvin Pao, NP       Future Appointments             In 1 month Melvin Pao, NP [place] [facility], [facility]   In 1 month Melvin Pao, NP [place] [facility], [facility]             MOUNJARO  2.5 MG/0.5ML Pen [Pharmacy Med Name: MOUNJARO  2.5 MG/0.5ML SUBQ SOLN ML] 2 mL 0    Sig: INJECT 2.5 MG INTO THE SKIN ONCE A WEEK     Off-Protocol Failed - 06/12/2023  9:53 AM      Failed - Medication not assigned to a protocol, review manually.      Passed - Valid encounter within last 12 months    Recent  Outpatient Visits           1 month ago Acquired hypothyroidism   [place] [facility] Melvin Pao, NP   5 months ago Pulmonary emphysema, unspecified emphysema type (HCC)   [place] [facility] Mecum, Erin E, PA-C   5 months ago Pulmonary emphysema, unspecified emphysema type (HCC)   [place] [facility] [place], Melanie T, NP   9 months ago Chest pain, unspecified type   [place] [facility] [place], Megan P, DO   11 months ago Encounter for annual wellness exam in Medicare patient   [place] [facility] Melvin Pao, NP       Future Appointments             In 1 month Melvin Pao, NP [place] [facility], [facility]   In 1 month Melvin Pao, NP [place] [facility], [facility]

## 2023-06-22 DIAGNOSIS — M5481 Occipital neuralgia: Secondary | ICD-10-CM | POA: Diagnosis not present

## 2023-06-22 DIAGNOSIS — Z5181 Encounter for therapeutic drug level monitoring: Secondary | ICD-10-CM | POA: Diagnosis not present

## 2023-06-22 DIAGNOSIS — G894 Chronic pain syndrome: Secondary | ICD-10-CM | POA: Diagnosis not present

## 2023-06-22 DIAGNOSIS — M62830 Muscle spasm of back: Secondary | ICD-10-CM | POA: Diagnosis not present

## 2023-06-25 ENCOUNTER — Telehealth: Payer: Self-pay | Admitting: Nurse Practitioner

## 2023-06-25 NOTE — Telephone Encounter (Signed)
Copied from CRM [id number]. Topic: Medicare AWV >> Jun 25, 2023 10:46 AM Payton Doughty wrote: Reason for CRM: Called LVM 06/25/2023 to schedule AWV. Please schedule office or virtual visits.  Verlee Rossetti; Care Guide Ambulatory Clinical Support [facility] l [facility] Direct Dial: [phone]

## 2023-06-30 ENCOUNTER — Encounter: Payer: Self-pay | Admitting: Nurse Practitioner

## 2023-06-30 ENCOUNTER — Ambulatory Visit (INDEPENDENT_AMBULATORY_CARE_PROVIDER_SITE_OTHER): Payer: 59 | Admitting: Nurse Practitioner

## 2023-06-30 VITALS — BP 114/70 | HR 68 | Temp 98.5°F | Ht 61.0 in | Wt 194.4 lb

## 2023-06-30 DIAGNOSIS — D696 Thrombocytopenia, unspecified: Secondary | ICD-10-CM | POA: Diagnosis not present

## 2023-06-30 DIAGNOSIS — J439 Emphysema, unspecified: Secondary | ICD-10-CM | POA: Diagnosis not present

## 2023-06-30 DIAGNOSIS — Z1231 Encounter for screening mammogram for malignant neoplasm of breast: Secondary | ICD-10-CM | POA: Diagnosis not present

## 2023-06-30 DIAGNOSIS — Z0000 Encounter for general adult medical examination without abnormal findings: Secondary | ICD-10-CM

## 2023-06-30 DIAGNOSIS — E66812 Obesity, class 2: Secondary | ICD-10-CM

## 2023-06-30 DIAGNOSIS — E559 Vitamin D deficiency, unspecified: Secondary | ICD-10-CM | POA: Diagnosis not present

## 2023-06-30 DIAGNOSIS — Z6836 Body mass index (BMI) 36.0-36.9, adult: Secondary | ICD-10-CM

## 2023-06-30 DIAGNOSIS — R002 Palpitations: Secondary | ICD-10-CM | POA: Diagnosis not present

## 2023-06-30 DIAGNOSIS — E7800 Pure hypercholesterolemia, unspecified: Secondary | ICD-10-CM

## 2023-06-30 DIAGNOSIS — E039 Hypothyroidism, unspecified: Secondary | ICD-10-CM

## 2023-06-30 DIAGNOSIS — E6609 Other obesity due to excess calories: Secondary | ICD-10-CM

## 2023-06-30 LAB — MICROSCOPIC EXAMINATION
Bacteria, UA: NONE SEEN
WBC, UA: NONE SEEN /[HPF] (ref 0–5)

## 2023-06-30 LAB — URINALYSIS, ROUTINE W REFLEX MICROSCOPIC
Bilirubin, UA: NEGATIVE
Glucose, UA: NEGATIVE
Ketones, UA: NEGATIVE
Leukocytes,UA: NEGATIVE
Nitrite, UA: NEGATIVE
Protein,UA: NEGATIVE
Specific Gravity, UA: 1.015 (ref 1.005–1.030)
Urobilinogen, Ur: 0.2 mg/dL (ref 0.2–1.0)
pH, UA: 6.5 (ref 5.0–7.5)

## 2023-06-30 MED ORDER — TIRZEPATIDE 5 MG/0.5ML ~~LOC~~ SOAJ: 5.0000 mg | SUBCUTANEOUS | 1 refills | Status: DC

## 2023-06-30 MED ORDER — OXYCODONE HCL 10 MG PO TABS: 10.0000 mg | ORAL_TABLET | Freq: Three times a day (TID) | ORAL | Status: DC

## 2023-06-30 MED ORDER — MONTELUKAST SODIUM 10 MG PO TABS: 10.0000 mg | ORAL_TABLET | Freq: Every day | ORAL | 1 refills | Status: DC

## 2023-06-30 NOTE — Progress Notes (Signed)
BP 114/70 (BP Location: Left Arm, Patient Position: Sitting, Cuff Size: Large)   Pulse 68   Temp 98.5 F (36.9 C) (Oral)   Ht 5\' 1"  (1.549 m)   Wt 194 lb 6.4 oz (88.2 kg)   SpO2 97%   BMI 36.73 kg/m    Subjective:    Patient ID: Janet Murray, female    DOB: 10-05-1970, 53 y.o.   MRN: [id number]  HPI: Janet Murray is a 53 y.o. female presenting on 06/30/2023 for comprehensive medical examination. Current medical complaints include:none  She currently lives with: Menopausal Symptoms: no  COPD Using Bevspi.  Only using albuterol PRN.  Working to get CT Lung screening scheduled. COPD status: stable Satisfied with current treatment?: yes Oxygen use: no Dyspnea frequency: not often  Cough frequency: very infrequent  Rescue inhaler frequency:  about 1-2 times per week  Limitation of activity:  does not feel limited very much  Productive cough:  Last Spirometry:  Pneumovax: unknown Influenza: Up to Date   HYPERLIPIDEMIA/ CAD  Hyperlipidemia status: good compliance Satisfied with current treatment?  yes Side effects:  no Medication compliance: good compliance Past cholesterol meds: atorvastain (lipitor) Supplements: none Aspirin:  no The 10-year ASCVD risk score (Arnett DK, et al., 2019) is: 4.6%   Values used to calculate the score:     Age: 65 years     Sex: Female     Is Non-Hispanic African American: No     Diabetic: Yes     Tobacco smoker: Yes     Systolic Blood Pressure: 94 mmHg     Is BP treated: No     HDL Cholesterol: 56 mg/dL     Total Cholesterol: 220 mg/dL Chest pain:  no Coronary artery disease:  yes   HYPOTHYROIDISM Thyroid control status:stable Satisfied with current treatment? yes Medication side effects: no Medication compliance: good compliance Etiology of hypothyroidism:  Recent dose adjustment:no Fatigue: no Cold intolerance: no Heat intolerance: no Weight gain: yes Weight loss: no Constipation: no Diarrhea/loose stools:   no Palpitations: yes- going on for the last couple of days. Lower extremity edema: no Anxiety/depressed mood: no  Depression Screen done today and results listed below:     06/30/2023   11:09 AM 04/22/2023    3:35 PM 01/12/2023    1:17 PM 12/16/2022    1:12 PM 10/27/2022    8:59 AM  Depression screen PHQ 2/9  Decreased Interest 1 1 1 1  0  Down, Depressed, Hopeless 0 0 0 0 0  PHQ - 2 Score 1 1 1 1  0  Altered sleeping 1 2 1 1    Tired, decreased energy 1 2 1 1    Change in appetite 0 0 0 0   Feeling bad or failure about yourself  0 0 0 0   Trouble concentrating 0 0 0 0   Moving slowly or fidgety/restless 0 0 0 0   Suicidal thoughts 0 0 0 0   PHQ-9 Score 3 5 3 3    Difficult doing work/chores   Not difficult at all Not difficult at all     The patient does not have a history of falls. I did complete a risk assessment for falls. A plan of care for falls was documented.   Past Medical History:  Past Medical History:  Diagnosis Date   Abdominal mass, RUQ (right upper quadrant)    Anxiety    Arthritis    Asthma    Bronchitis    Cancer (HCC) 04/12/2021  Carpal tunnel syndrome of left wrist 10/25/2014   Chronic bronchitis, obstructive (HCC) 07/20/2017   Chronic pain    Colon cancer (HCC)    COPD (chronic obstructive pulmonary disease) (HCC)    Coronary artery disease    Cyst of right kidney 03/22/2018   DDD (degenerative disc disease), lumbar 10/25/2014   Degenerative joint disease (DJD) of hip    Diarrhea of infectious origin 03/25/2018   Dysphagia    Dyspnea    Emphysema of lung (HCC)    Facet syndrome, lumbar 10/25/2014   Fatty liver 03/25/2018   Foot fracture, left 05/02/2015   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   GERD (gastroesophageal reflux disease)    Hereditary and idiopathic peripheral neuropathy 11/06/2014   Hypothyroidism    IBS (irritable bowel syndrome)    Low back pain    Migraine 02/04/2016   Occipital neuralgia    Osteoarthritis of spine with radiculopathy,  lumbar region 10/25/2014   Other intervertebral disc degeneration, lumbar region    Other irritable bowel syndrome 04/20/2017   Other spondylosis with radiculopathy, lumbar region 10/25/2014   RLS (restless legs syndrome)    Sacroiliac joint dysfunction of both sides 10/25/2014   Spondylosis of lumbar spine    Stomach cancer (HCC)    Urinary incontinence     Surgical History:  Past Surgical History:  Procedure Laterality Date   ABDOMINAL HYSTERECTOMY     APPENDECTOMY     BLADDER SURGERY     sling - Dr. Barnabas Lister   CESAREAN SECTION     COLONOSCOPY WITH PROPOFOL N/A 04/11/2020   Procedure: COLONOSCOPY WITH PROPOFOL;  Surgeon: Pasty Spillers, MD;  Location: [facility] ENDOSCOPY;  Service: Endoscopy;  Laterality: N/A;   COLONOSCOPY WITH PROPOFOL N/A 10/22/2021   Procedure: COLONOSCOPY WITH PROPOFOL;  Surgeon: Toney Reil, MD;  Location: [facility] ENDOSCOPY;  Service: Gastroenterology;  Laterality: N/A;   ESOPHAGOGASTRODUODENOSCOPY  04/26/2019   STOMACH SURGERY     THYROIDECTOMY N/A 04/12/2021   Procedure: THYROIDECTOMY, total;  Surgeon: Leafy Ro, MD;  Location: [facility] ORS;  Service: General;  Laterality: N/A;   TOTAL ABDOMINAL HYSTERECTOMY W/ BILATERAL SALPINGOOPHORECTOMY      Medications:  Current Outpatient Medications on File Prior to Visit  Medication Sig   albuterol (PROVENTIL) (2.5 MG/3ML) 0.083% nebulizer solution Take 3 mLs (2.5 mg total) by nebulization every 6 (six) hours as needed for wheezing or shortness of breath.   albuterol (VENTOLIN HFA) 108 (90 Base) MCG/ACT inhaler INHALE 2 PUFFS INTO THE LUNGS EVERY 6 HOURS AS NEEDED FOR WHEEZING OR SHORTNESS OF BREATH   AMBULATORY NON FORMULARY MEDICATION Medication Name: nebulizer DX: J44.9   atorvastatin (LIPITOR) 80 MG tablet TAKE 1 TABLET BY MOUTH DAILY   diclofenac Sodium (VOLTAREN) 1 % GEL Apply 1 application topically daily as needed (Knee pain).   esomeprazole (NEXIUM) 40 MG capsule Take 1 capsule (40 mg total)  by mouth 2 (two) times daily.   fluticasone (FLONASE) 50 MCG/ACT nasal spray Place 2 sprays into both nostrils daily.   Glycopyrrolate-Formoterol (BEVESPI AEROSPHERE) 9-4.8 MCG/ACT AERO 2 puffs twice a day   levothyroxine (SYNTHROID) 200 MCG tablet Take 1 tablet (200 mcg total) by mouth daily.   mirabegron ER (MYRBETRIQ) 50 MG TB24 tablet Take 1 tablet (50 mg total) by mouth daily.   mometasone (ELOCON) 0.1 % lotion External for 30 Days   naloxone (NARCAN) nasal spray 4 mg/0.1 mL SMARTSIG:Both Nares   ondansetron (ZOFRAN-ODT) 4 MG disintegrating tablet TAKE 1 TABLET BY MOUTH EVERY 8 HOURS AS  NEEDED   pramipexole (MIRAPEX) 0.5 MG tablet TAKE 1 TABLET BY MOUTH 3 TIMES DAILY   tiZANidine (ZANAFLEX) 2 MG tablet Take 2 mg by mouth 2 (two) times daily.   triamcinolone cream (KENALOG) 0.1 % Apply 1 Application topically 2 (two) times daily.   Vitamin D, Ergocalciferol, (DRISDOL) 1.25 MG (50000 UNIT) CAPS capsule Take 1 capsule (50,000 Units total) by mouth once a week. For 12 weeks. Then start OTC Vitamin D3 2,000 unit daily.   No current facility-administered medications on file prior to visit.    Allergies:  Allergies  Allergen Reactions   Penicillins Hives and Swelling    Has patient had a PCN reaction causing immediate rash, facial/tongue/throat swelling, SOB or lightheadedness with hypotension: Yes Has patient had a PCN reaction causing severe rash involving mucus membranes or skin necrosis: No Has patient had a PCN reaction that required hospitalization No Has patient had a PCN reaction occurring within the last 10 years: No If all of the above answers are "NO", then may proceed with Cephalosporin use.   Acetaminophen     Due to polyps    Ciprofloxacin Itching   Aspirin Palpitations    Heart palpitations   Hydrocodone Hives and Rash   Tape Rash    Per patient "clear tape"    Social History:  Social History   Socioeconomic History   Marital status: Single    Spouse name: Not on  file   Number of children: 2   Years of education: Not on file   Highest education level: 9th grade  Occupational History   Not on file  Tobacco Use   Smoking status: Every Day    Current packs/day: 0.50    Average packs/day: 0.5 packs/day for 25.0 years (12.5 ttl pk-yrs)    Types: Cigarettes   Smokeless tobacco: Never   Tobacco comments:    0.5PPD 11/11/2022 khj  Vaping Use   Vaping status: Never Used  Substance and Sexual Activity   Alcohol use: Not Currently    Comment: occassionally   Drug use: No   Sexual activity: Not Currently    Birth control/protection: Surgical    Comment: Hysterectomy  Other Topics Concern   Not on file  Social History Narrative   Lives alone   Social Drivers of Health   Financial Resource Strain: Medium Risk (12/16/2022)   Overall Financial Resource Strain (CARDIA)    Difficulty of Paying Living Expenses: Somewhat hard  Food Insecurity: No Food Insecurity (12/16/2022)   Hunger Vital Sign    Worried About Running Out of Food in the Last Year: Never true    Ran Out of Food in the Last Year: Never true  Transportation Needs: No Transportation Needs (12/16/2022)   PRAPARE - Administrator, Civil Service (Medical): No    Lack of Transportation (Non-Medical): No  Physical Activity: Insufficiently Active (12/16/2022)   Exercise Vital Sign    Days of Exercise per Week: 5 days    Minutes of Exercise per Session: 10 min  Stress: No Stress Concern Present (12/16/2022)   [employer] of Occupational Health - Occupational Stress Questionnaire    Feeling of Stress : Not at all  Social Connections: Moderately Integrated (12/16/2022)   Social Connection and Isolation Panel [NHANES]    Frequency of Communication with Friends and Family: More than three times a week    Frequency of Social Gatherings with Friends and Family: Three times a week    Attends Religious Services: More than 4  times per year    Active Member of Clubs or Organizations: Yes     Attends Banker Meetings: 1 to 4 times per year    Marital Status: Divorced  Intimate Partner Violence: Not At Risk (03/05/2021)   Humiliation, Afraid, Rape, and Kick questionnaire    Fear of Current or Ex-Partner: No    Emotionally Abused: No    Physically Abused: No    Sexually Abused: No   Social History   Tobacco Use  Smoking Status Every Day   Current packs/day: 0.50   Average packs/day: 0.5 packs/day for 25.0 years (12.5 ttl pk-yrs)   Types: Cigarettes  Smokeless Tobacco Never  Tobacco Comments   0.5PPD 11/11/2022 khj   Social History   Substance and Sexual Activity  Alcohol Use Not Currently   Comment: occassionally    Family History:  Family History  Problem Relation Age of Onset   Neuropathy Mother    Atrial fibrillation Mother    Arthritis Father    Hypertension Father    Leukemia Father    Heart disease Father        CAD s/p CABG   Healthy Brother    Healthy Brother    Healthy Brother    Dementia Maternal Grandmother    Hypertension Maternal Grandmother    Hypercholesterolemia Maternal Grandmother    Heart disease Maternal Grandmother    Healthy Daughter    Healthy Son    Stroke Neg Hx    Cancer Neg Hx    Breast cancer Neg Hx     Past medical history, surgical history, medications, allergies, family history and social history reviewed with patient today and changes made to appropriate areas of the chart.   Review of Systems  Constitutional:  Negative for malaise/fatigue.  Eyes:  Negative for blurred vision and double vision.  Respiratory:  Negative for shortness of breath.   Cardiovascular:  Positive for palpitations. Negative for chest pain and leg swelling.  Neurological:  Negative for dizziness and headaches.   All other ROS negative except what is listed above and in the HPI.      Objective:    BP 114/70 (BP Location: Left Arm, Patient Position: Sitting, Cuff Size: Large)   Pulse 68   Temp 98.5 F (36.9 C) (Oral)   Ht 5\' 1"   (1.549 m)   Wt 194 lb 6.4 oz (88.2 kg)   SpO2 97%   BMI 36.73 kg/m   Wt Readings from Last 3 Encounters:  06/30/23 194 lb 6.4 oz (88.2 kg)  04/22/23 196 lb 12.8 oz (89.3 kg)  02/10/23 188 lb 6.4 oz (85.5 kg)    Physical Exam Vitals and nursing note reviewed.  Constitutional:      General: She is awake. She is not in acute distress.    Appearance: Normal appearance. She is well-developed. She is obese. She is not ill-appearing.  HENT:     Head: Normocephalic and atraumatic.     Right Ear: Hearing, tympanic membrane, ear canal and external ear normal. No drainage.     Left Ear: Hearing, tympanic membrane, ear canal and external ear normal. No drainage.     Nose: Nose normal.     Right Sinus: No maxillary sinus tenderness or frontal sinus tenderness.     Left Sinus: No maxillary sinus tenderness or frontal sinus tenderness.     Mouth/Throat:     Mouth: Mucous membranes are moist.     Pharynx: Oropharynx is clear. Uvula midline. No pharyngeal swelling,  oropharyngeal exudate or posterior oropharyngeal erythema.  Eyes:     General: Lids are normal.        Right eye: No discharge.        Left eye: No discharge.     Extraocular Movements: Extraocular movements intact.     Conjunctiva/sclera: Conjunctivae normal.     Pupils: Pupils are equal, round, and reactive to light.     Visual Fields: Right eye visual fields normal and left eye visual fields normal.  Neck:     Thyroid: No thyromegaly.     Vascular: No carotid bruit.     Trachea: Trachea normal.  Cardiovascular:     Rate and Rhythm: Normal rate and regular rhythm.     Heart sounds: Normal heart sounds. No murmur heard.    No gallop.  Pulmonary:     Effort: Pulmonary effort is normal. No accessory muscle usage or respiratory distress.     Breath sounds: Normal breath sounds.  Chest:  Breasts:    Right: Normal.     Left: Normal.  Abdominal:     General: Bowel sounds are normal.     Palpations: Abdomen is soft. There is no  hepatomegaly or splenomegaly.     Tenderness: There is no abdominal tenderness.  Musculoskeletal:        General: Normal range of motion.     Cervical back: Normal range of motion and neck supple.     Right lower leg: No edema.     Left lower leg: No edema.  Lymphadenopathy:     Head:     Right side of head: No submental, submandibular, tonsillar, preauricular or posterior auricular adenopathy.     Left side of head: No submental, submandibular, tonsillar, preauricular or posterior auricular adenopathy.     Cervical: No cervical adenopathy.     Upper Body:     Right upper body: No supraclavicular, axillary or pectoral adenopathy.     Left upper body: No supraclavicular, axillary or pectoral adenopathy.  Skin:    General: Skin is warm and dry.     Capillary Refill: Capillary refill takes less than 2 seconds.     Findings: No rash.  Neurological:     Mental Status: She is alert and oriented to person, place, and time.     Gait: Gait is intact.  Psychiatric:        Attention and Perception: Attention normal.        Mood and Affect: Mood normal.        Speech: Speech normal.        Behavior: Behavior normal. Behavior is cooperative.        Thought Content: Thought content normal.        Judgment: Judgment normal.     Results for orders placed or performed in visit on 06/04/23  T4, free   Collection Time: 06/04/23  1:17 PM  Result Value Ref Range   Free T4 1.43 0.82 - 1.77 ng/dL  TSH   Collection Time: 06/04/23  1:17 PM  Result Value Ref Range   TSH 8.880 (H) 0.450 - 4.500 uIU/mL      Assessment & Plan:   Problem List Items Addressed This Visit       Respiratory   COPD (chronic obstructive pulmonary disease) with emphysema (HCC)   Chronic.  Controlled.  Continue with current medication regimen of Bevspi and PRN Albuterol.  Followed by Pulmonology, Dr. Jayme Cloud.  Labs ordered today.  Return to clinic in 6 months  for reevaluation.  Call sooner if concerns arise.        Relevant Medications   montelukast (SINGULAIR) 10 MG tablet     Endocrine   Acquired hypothyroidism   Chronic, controlled. She is currently taking Levothyroxine 200 mcg PO every day and appears to be tolerating well - she did complain of some palpitations. Recheck thyroid panel today- results to dictate further management  Continue current regimen pending labs Follow up in 6 months or sooner if concerns arise       Relevant Orders   TSH   T4, free     Hematopoietic and Hemostatic   Thrombocytopenia (HCC)   Labs ordered at visit today.  Will make recommendations based on lab results.          Other   Obesity   Currently on Mounjaro and tolerating the medication well.  Continue with Mounjaro 0.5mg  weekly.  Follow up in 3 months.  Call sooner if concerns arise.       Relevant Medications   tirzepatide (MOUNJARO) 5 MG/0.5ML Pen   High cholesterol   Chronic.  Controlled.  Continue with current medication regimen of Atorvastatin 80mg .  Labs ordered today.  Return to clinic in 6 months for reevaluation.  Call sooner if concerns arise.       Relevant Orders   Lipid panel   Vitamin D deficiency   Labs ordered at visit today.  Will make recommendations based on lab results.       Relevant Orders   Vitamin D (25 hydroxy)   Other Visit Diagnoses       Annual physical exam    -  Primary   Health maintenance reviewed during visit today.  Labs ordered.  Vaccines reviewed.  Mammogram and Colonoscopy up to date.   Relevant Orders   CBC with Differential/Platelet   Comprehensive metabolic panel   Lipid panel   TSH   Urinalysis, Routine w reflex microscopic   T4, free     Palpitations       NSR on exam. If Thyroid labs are WNL will have patient follow up with Cardiology.     Encounter for screening mammogram for malignant neoplasm of breast       Relevant Orders   MM 3D SCREENING MAMMOGRAM BILATERAL BREAST        Follow up plan: Return in about 6 months (around 12/28/2023)  for HTN, HLD, DM2 FU.   LABORATORY TESTING:  - Pap smear:  Hysterectomy  IMMUNIZATIONS:   - Tdap: Tetanus vaccination status reviewed: last tetanus booster within 10 years. - Influenza: Refused - Pneumovax: Refused - Prevnar: Not applicable - COVID: Not applicable - HPV: Not applicable - Shingrix vaccine:  Discussed at visit today  SCREENING: -Mammogram: Up to date  - Colonoscopy: Up to date  - Bone Density: Not applicable  -Hearing Test: Not applicable  -Spirometry: Not applicable   PATIENT COUNSELING:   Advised to take 1 mg of folate supplement per day if capable of pregnancy.   Sexuality: Discussed sexually transmitted diseases, partner selection, use of condoms, avoidance of unintended pregnancy  and contraceptive alternatives.   Advised to avoid cigarette smoking.  I discussed with the patient that most people either abstain from alcohol or drink within safe limits (<=14/week and <=4 drinks/occasion for males, <=7/weeks and <= 3 drinks/occasion for females) and that the risk for alcohol disorders and other health effects rises proportionally with the number of drinks per week and how often a drinker exceeds daily  limits.  Discussed cessation/primary prevention of drug use and availability of treatment for abuse.   Diet: Encouraged to adjust caloric intake to maintain  or achieve ideal body weight, to reduce intake of dietary saturated fat and total fat, to limit sodium intake by avoiding high sodium foods and not adding table salt, and to maintain adequate dietary potassium and calcium preferably from fresh fruits, vegetables, and low-fat dairy products.    stressed the importance of regular exercise  Injury prevention: Discussed safety belts, safety helmets, smoke detector, smoking near bedding or upholstery.   Dental health: Discussed importance of regular tooth brushing, flossing, and dental visits.    NEXT PREVENTATIVE PHYSICAL DUE IN 1 YEAR. Return in about 6  months (around 12/28/2023) for HTN, HLD, DM2 FU.

## 2023-06-30 NOTE — Assessment & Plan Note (Signed)
Chronic, controlled. She is currently taking Levothyroxine 200 mcg PO every day and appears to be tolerating well - she did complain of some palpitations. Recheck thyroid panel today- results to dictate further management  Continue current regimen pending labs Follow up in 6 months or sooner if concerns arise

## 2023-06-30 NOTE — Assessment & Plan Note (Signed)
Chronic.  Controlled.  Continue with current medication regimen of Bevspi and PRN Albuterol.  Followed by Pulmonology, Dr. Jayme Cloud.  Labs ordered today.  Return to clinic in 6 months for reevaluation.  Call sooner if concerns arise.

## 2023-06-30 NOTE — Assessment & Plan Note (Signed)
Labs ordered at visit today.  Will make recommendations based on lab results.   

## 2023-06-30 NOTE — Assessment & Plan Note (Signed)
Chronic.  Controlled.  Continue with current medication regimen of Atorvastatin 80mg.  Labs ordered today.  Return to clinic in 6 months for reevaluation.  Call sooner if concerns arise.   

## 2023-06-30 NOTE — Assessment & Plan Note (Signed)
Currently on Mounjaro and tolerating the medication well.  Continue with Mounjaro 0.5mg  weekly.  Follow up in 3 months.  Call sooner if concerns arise.

## 2023-07-01 ENCOUNTER — Encounter: Payer: Self-pay | Admitting: Nurse Practitioner

## 2023-07-01 LAB — CBC WITH DIFFERENTIAL/PLATELET
Basophils Absolute: 0.1 10*3/uL (ref 0.0–0.2)
Basos: 1 %
EOS (ABSOLUTE): 0.2 10*3/uL (ref 0.0–0.4)
Eos: 3 %
Hematocrit: 39.8 % (ref 34.0–46.6)
Hemoglobin: 13.3 g/dL (ref 11.1–15.9)
Immature Grans (Abs): 0 10*3/uL (ref 0.0–0.1)
Immature Granulocytes: 0 %
Lymphocytes Absolute: 2.2 10*3/uL (ref 0.7–3.1)
Lymphs: 39 %
MCH: 34.4 pg — ABNORMAL HIGH (ref 26.6–33.0)
MCHC: 33.4 g/dL (ref 31.5–35.7)
MCV: 103 fL — ABNORMAL HIGH (ref 79–97)
Monocytes Absolute: 0.5 10*3/uL (ref 0.1–0.9)
Monocytes: 9 %
Neutrophils Absolute: 2.8 10*3/uL (ref 1.4–7.0)
Neutrophils: 48 %
RBC: 3.87 x10E6/uL (ref 3.77–5.28)
RDW: 12.1 % (ref 11.7–15.4)
WBC: 5.7 10*3/uL (ref 3.4–10.8)

## 2023-07-01 LAB — LIPID PANEL
Chol/HDL Ratio: 4 {ratio} (ref 0.0–4.4)
Cholesterol, Total: 205 mg/dL — ABNORMAL HIGH (ref 100–199)
HDL: 51 mg/dL (ref >39)
LDL Chol Calc (NIH): 140 mg/dL — ABNORMAL HIGH (ref 0–99)
Triglycerides: 79 mg/dL (ref 0–149)
VLDL Cholesterol Cal: 14 mg/dL (ref 5–40)

## 2023-07-01 LAB — TSH: TSH: 2.58 u[IU]/mL (ref 0.450–4.500)

## 2023-07-01 LAB — COMPREHENSIVE METABOLIC PANEL
ALT: 7 [IU]/L (ref 0–32)
AST: 9 [IU]/L (ref 0–40)
Albumin: 4.2 g/dL (ref 3.8–4.9)
Alkaline Phosphatase: 79 [IU]/L (ref 44–121)
BUN/Creatinine Ratio: 12 (ref 9–23)
BUN: 8 mg/dL (ref 6–24)
Bilirubin Total: 0.3 mg/dL (ref 0.0–1.2)
CO2: 23 mmol/L (ref 20–29)
Calcium: 9.6 mg/dL (ref 8.7–10.2)
Chloride: 101 mmol/L (ref 96–106)
Creatinine, Ser: 0.67 mg/dL (ref 0.57–1.00)
Globulin, Total: 2.1 g/dL (ref 1.5–4.5)
Glucose: 99 mg/dL (ref 70–99)
Potassium: 3.9 mmol/L (ref 3.5–5.2)
Sodium: 138 mmol/L (ref 134–144)
Total Protein: 6.3 g/dL (ref 6.0–8.5)
eGFR: 105 mL/min/{1.73_m2} (ref >59)

## 2023-07-01 LAB — VITAMIN D 25 HYDROXY (VIT D DEFICIENCY, FRACTURES): Vit D, 25-Hydroxy: 22.7 ng/mL — ABNORMAL LOW (ref 30.0–100.0)

## 2023-07-01 LAB — T4, FREE: Free T4: 1.42 ng/dL (ref 0.82–1.77)

## 2023-07-15 ENCOUNTER — Ambulatory Visit (INDEPENDENT_AMBULATORY_CARE_PROVIDER_SITE_OTHER): Payer: 59 | Admitting: Nurse Practitioner

## 2023-07-15 VITALS — BP 121/77 | HR 67 | Ht 61.0 in | Wt 194.0 lb

## 2023-07-15 DIAGNOSIS — E559 Vitamin D deficiency, unspecified: Secondary | ICD-10-CM

## 2023-07-15 DIAGNOSIS — J439 Emphysema, unspecified: Secondary | ICD-10-CM

## 2023-07-15 DIAGNOSIS — E7800 Pure hypercholesterolemia, unspecified: Secondary | ICD-10-CM

## 2023-07-15 DIAGNOSIS — C73 Malignant neoplasm of thyroid gland: Secondary | ICD-10-CM

## 2023-07-15 DIAGNOSIS — Z0000 Encounter for general adult medical examination without abnormal findings: Secondary | ICD-10-CM | POA: Diagnosis not present

## 2023-07-15 DIAGNOSIS — Z7189 Other specified counseling: Secondary | ICD-10-CM | POA: Diagnosis not present

## 2023-07-15 NOTE — Assessment & Plan Note (Signed)
Chronic.  Controlled.  Continue with current medication regimen of Atorvastatin 80mg.  Labs ordered today.  Return to clinic in 6 months for reevaluation.  Call sooner if concerns arise.   

## 2023-07-15 NOTE — Assessment & Plan Note (Signed)
 Chronic.  Controlled.  Continue with current medication regimen of Bevspi and PRN Albuterol .  Followed by Pulmonology, Dr. Tamea.  Labs ordered today.  Return to clinic in 6 months for reevaluation.  Call sooner if concerns arise.  Working to get CT Lung scheduled.

## 2023-07-15 NOTE — Assessment & Plan Note (Signed)
 Labs ordered at visit today.  Will make recommendations based on lab results.

## 2023-07-15 NOTE — Assessment & Plan Note (Signed)
 A voluntary discussion about advance care planning including the explanation and discussion of advance directives was extensively discussed  with the patient for 3 minutes with patient and 3 present.  Explanation about the health care proxy and Living will was reviewed and packet with forms with explanation of how to fill them out was given.

## 2023-07-15 NOTE — Assessment & Plan Note (Signed)
 Chronic.  Continue with current of Levothyroxine  200mcg daily.  Labs ordered today. Follow up in 6 months.  Call sooner if concerns arise.

## 2023-07-15 NOTE — Progress Notes (Signed)
 BP 121/77 (BP Location: Right Arm, Patient Position: Sitting, Cuff Size: Large)   Pulse 67   Ht 5' 1 (1.549 m)   Wt 194 lb (88 kg)   SpO2 97%   BMI 36.66 kg/m    Subjective:    Patient ID: Janet Murray, female    DOB: 11-27-1970, 53 y.o.   MRN: [id number]  HPI: Janet Murray is a 53 y.o. female presenting on 07/15/2023 for comprehensive medical examination. Current medical complaints include:none  She currently lives with: Menopausal Symptoms: no  COPD Using Bevspi.  Only using albuterol  PRN.  Working to get CT Lung screening scheduled.  She does have some SOB with activity.  COPD status: stable Satisfied with current treatment?: yes Oxygen use: no Dyspnea frequency: not often  Cough frequency: very infrequent  Rescue inhaler frequency:  about 1-2 times per week  Limitation of activity:  does not feel limited very much  Productive cough:  Last Spirometry:  Pneumovax: unknown Influenza: Up to Date   HYPERLIPIDEMIA/ CAD  Hyperlipidemia status: good compliance Satisfied with current treatment?  yes Side effects:  no Medication compliance: good compliance Past cholesterol meds: atorvastain (lipitor) Supplements: none Aspirin:  no The 10-year ASCVD risk score (Arnett DK, et al., 2019) is: 4.6%   Values used to calculate the score:     Age: 62 years     Sex: Female     Is Non-Hispanic African American: No     Diabetic: Yes     Tobacco smoker: Yes     Systolic Blood Pressure: 94 mmHg     Is BP treated: No     HDL Cholesterol: 56 mg/dL     Total Cholesterol: 220 mg/dL Chest pain:  no Coronary artery disease:  yes   HYPOTHYROIDISM Thyroid  control status:stable Satisfied with current treatment? yes Medication side effects: no Medication compliance: good compliance Etiology of hypothyroidism:  Recent dose adjustment:no Fatigue: no Cold intolerance: no Heat intolerance: no Weight gain: yes Weight loss: no Constipation: no Diarrhea/loose stools:   no Palpitations: yes- going on for the last couple of days. Lower extremity edema: no Anxiety/depressed mood: no  Functional Status Survey: Is the patient deaf or have difficulty hearing?: No Does the patient have difficulty seeing, even when wearing glasses/contacts?: No Does the patient have difficulty concentrating, remembering, or making decisions?: No Does the patient have difficulty walking or climbing stairs?: No Does the patient have difficulty dressing or bathing?: No Does the patient have difficulty doing errands alone such as visiting a doctor's office or shopping?: No     01/19/2023   11:37 AM 01/12/2023    1:16 PM 12/29/2022    2:12 PM 12/16/2022    1:11 PM 10/27/2022    8:59 AM  Fall Risk   Falls in the past year? 0 0 0 0 0  Number falls in past yr:  0  0   Injury with Fall?  0  0   Risk for fall due to :  No Fall Risks  No Fall Risks   Follow up  Falls evaluation completed       Depression Screen    07/15/2023    1:45 PM 06/30/2023   11:09 AM 04/22/2023    3:35 PM 01/12/2023    1:17 PM 12/16/2022    1:12 PM  Depression screen PHQ 2/9  Decreased Interest 0 1 1 1 1   Down, Depressed, Hopeless 0 0 0 0 0  PHQ - 2 Score 0 1 1 1  1  Altered sleeping 0 1 2 1 1   Tired, decreased energy 0 1 2 1 1   Change in appetite 0 0 0 0 0  Feeling bad or failure about yourself  0 0 0 0 0  Trouble concentrating 0 0 0 0 0  Moving slowly or fidgety/restless 0 0 0 0 0  Suicidal thoughts 0 0 0 0 0  PHQ-9 Score 0 3 5 3 3   Difficult doing work/chores Not difficult at all   Not difficult at all Not difficult at all     Advanced Directives Does patient have a HCPOA?    no If yes, name and contact information:  Does patient have a living will or MOST form?  no  Past Medical History:  Past Medical History:  Diagnosis Date   Abdominal mass, RUQ (right upper quadrant)    Anxiety    Arthritis    Asthma    Bronchitis    Cancer (HCC) 04/12/2021   Carpal tunnel syndrome of left wrist  10/25/2014   Chronic bronchitis, obstructive (HCC) 07/20/2017   Chronic pain    Colon cancer (HCC)    COPD (chronic obstructive pulmonary disease) (HCC)    Coronary artery disease    Cyst of right kidney 03/22/2018   DDD (degenerative disc disease), lumbar 10/25/2014   Degenerative joint disease (DJD) of hip    Diarrhea of infectious origin 03/25/2018   Dysphagia    Dyspnea    Emphysema of lung (HCC)    Facet syndrome, lumbar 10/25/2014   Fatty liver 03/25/2018   Foot fracture, left 05/02/2015   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   GERD (gastroesophageal reflux disease)    Hereditary and idiopathic peripheral neuropathy 11/06/2014   Hypothyroidism    IBS (irritable bowel syndrome)    Low back pain    Migraine 02/04/2016   Occipital neuralgia    Osteoarthritis of spine with radiculopathy, lumbar region 10/25/2014   Other intervertebral disc degeneration, lumbar region    Other irritable bowel syndrome 04/20/2017   Other spondylosis with radiculopathy, lumbar region 10/25/2014   RLS (restless legs syndrome)    Sacroiliac joint dysfunction of both sides 10/25/2014   Spondylosis of lumbar spine    Stomach cancer (HCC)    Urinary incontinence     Surgical History:  Past Surgical History:  Procedure Laterality Date   ABDOMINAL HYSTERECTOMY     APPENDECTOMY     BLADDER SURGERY     sling - Dr. Madge    CESAREAN SECTION     COLONOSCOPY WITH PROPOFOL  N/A 04/11/2020   Procedure: COLONOSCOPY WITH PROPOFOL ;  Surgeon: Janalyn Keene NOVAK, MD;  Location: [facility] ENDOSCOPY;  Service: Endoscopy;  Laterality: N/A;   COLONOSCOPY WITH PROPOFOL  N/A 10/22/2021   Procedure: COLONOSCOPY WITH PROPOFOL ;  Surgeon: Unk Corinn Skiff, MD;  Location: [facility] ENDOSCOPY;  Service: Gastroenterology;  Laterality: N/A;   ESOPHAGOGASTRODUODENOSCOPY  04/26/2019   STOMACH SURGERY     THYROIDECTOMY N/A 04/12/2021   Procedure: THYROIDECTOMY, total;  Surgeon: Jordis Laneta FALCON, MD;  Location: [facility] ORS;  Service:  General;  Laterality: N/A;   TOTAL ABDOMINAL HYSTERECTOMY W/ BILATERAL SALPINGOOPHORECTOMY      Medications:  Current Outpatient Medications on File Prior to Visit  Medication Sig   albuterol  (PROVENTIL ) (2.5 MG/3ML) 0.083% nebulizer solution Take 3 mLs (2.5 mg total) by nebulization every 6 (six) hours as needed for wheezing or shortness of breath.   albuterol  (VENTOLIN  HFA) 108 (90 Base) MCG/ACT inhaler INHALE 2 PUFFS INTO THE LUNGS EVERY 6 HOURS AS  NEEDED FOR WHEEZING OR SHORTNESS OF BREATH   AMBULATORY NON FORMULARY MEDICATION Medication Name: nebulizer DX: J44.9   atorvastatin  (LIPITOR) 80 MG tablet TAKE 1 TABLET BY MOUTH DAILY   diclofenac Sodium (VOLTAREN) 1 % GEL Apply 1 application topically daily as needed (Knee pain).   esomeprazole  (NEXIUM ) 40 MG capsule Take 1 capsule (40 mg total) by mouth 2 (two) times daily.   fluticasone  (FLONASE ) 50 MCG/ACT nasal spray Place 2 sprays into both nostrils daily.   Glycopyrrolate -Formoterol  (BEVESPI  AEROSPHERE) 9-4.8 MCG/ACT AERO 2 puffs twice a day   levothyroxine  (SYNTHROID ) 200 MCG tablet Take 1 tablet (200 mcg total) by mouth daily.   mirabegron  ER (MYRBETRIQ ) 50 MG TB24 tablet Take 1 tablet (50 mg total) by mouth daily.   montelukast  (SINGULAIR ) 10 MG tablet Take 1 tablet (10 mg total) by mouth at bedtime.   naloxone (NARCAN) nasal spray 4 mg/0.1 mL SMARTSIG:Both Nares   ondansetron  (ZOFRAN -ODT) 4 MG disintegrating tablet TAKE 1 TABLET BY MOUTH EVERY 8 HOURS AS NEEDED   oxyCODONE  10 MG TABS Take 1 tablet (10 mg total) by mouth every 8 (eight) hours.   pramipexole  (MIRAPEX ) 0.5 MG tablet TAKE 1 TABLET BY MOUTH 3 TIMES DAILY   tirzepatide  (MOUNJARO ) 5 MG/0.5ML Pen Inject 5 mg into the skin once a week.   tiZANidine  (ZANAFLEX ) 2 MG tablet Take 2 mg by mouth 2 (two) times daily.   Vitamin D , Ergocalciferol , (DRISDOL ) 1.25 MG (50000 UNIT) CAPS capsule Take 1 capsule (50,000 Units total) by mouth once a week. For 12 weeks. Then start OTC Vitamin  D3 2,000 unit daily.   No current facility-administered medications on file prior to visit.    Allergies:  Allergies  Allergen Reactions   Penicillins Hives and Swelling    Has patient had a PCN reaction causing immediate rash, facial/tongue/throat swelling, SOB or lightheadedness with hypotension: Yes Has patient had a PCN reaction causing severe rash involving mucus membranes or skin necrosis: No Has patient had a PCN reaction that required hospitalization No Has patient had a PCN reaction occurring within the last 10 years: No If all of the above answers are NO, then may proceed with Cephalosporin use.   Acetaminophen      Due to polyps    Ciprofloxacin  Itching   Aspirin Palpitations    Heart palpitations   Hydrocodone  Hives and Rash   Tape Rash    Per patient clear tape    Social History:  Social History   Socioeconomic History   Marital status: Single    Spouse name: Not on file   Number of children: 2   Years of education: Not on file   Highest education level: 9th grade  Occupational History   Not on file  Tobacco Use   Smoking status: Every Day    Current packs/day: 0.50    Average packs/day: 0.5 packs/day for 25.0 years (12.5 ttl pk-yrs)    Types: Cigarettes   Smokeless tobacco: Never   Tobacco comments:    0.5PPD 11/11/2022 khj  Vaping Use   Vaping status: Never Used  Substance and Sexual Activity   Alcohol use: Not Currently    Comment: occassionally   Drug use: No   Sexual activity: Not Currently    Birth control/protection: Surgical    Comment: Hysterectomy  Other Topics Concern   Not on file  Social History Narrative   Lives alone   Social Drivers of Health   Financial Resource Strain: Patient Declined (07/15/2023)   Overall Physicist, Medical  Strain (CARDIA)    Difficulty of Paying Living Expenses: Patient declined  Food Insecurity: Patient Declined (07/15/2023)   Hunger Vital Sign    Worried About Running Out of Food in the Last Year:  Patient declined    Ran Out of Food in the Last Year: Patient declined  Transportation Needs: No Transportation Needs (07/15/2023)   PRAPARE - Administrator, Civil Service (Medical): No    Lack of Transportation (Non-Medical): No  Physical Activity: Insufficiently Active (07/15/2023)   Exercise Vital Sign    Days of Exercise per Week: 3 days    Minutes of Exercise per Session: 10 min  Stress: No Stress Concern Present (07/15/2023)   [employer] of Occupational Health - Occupational Stress Questionnaire    Feeling of Stress : Not at all  Social Connections: Moderately Isolated (07/15/2023)   Social Connection and Isolation Panel [NHANES]    Frequency of Communication with Friends and Family: More than three times a week    Frequency of Social Gatherings with Friends and Family: More than three times a week    Attends Religious Services: Never    Database Administrator or Organizations: No    Attends Engineer, Structural: 1 to 4 times per year    Marital Status: Divorced  Catering Manager Violence: Not At Risk (03/05/2021)   Humiliation, Afraid, Rape, and Kick questionnaire    Fear of Current or Ex-Partner: No    Emotionally Abused: No    Physically Abused: No    Sexually Abused: No   Social History   Tobacco Use  Smoking Status Every Day   Current packs/day: 0.50   Average packs/day: 0.5 packs/day for 25.0 years (12.5 ttl pk-yrs)   Types: Cigarettes  Smokeless Tobacco Never  Tobacco Comments   0.5PPD 11/11/2022 khj   Social History   Substance and Sexual Activity  Alcohol Use Not Currently   Comment: occassionally    Family History:  Family History  Problem Relation Age of Onset   Neuropathy Mother    Atrial fibrillation Mother    Arthritis Father    Hypertension Father    Leukemia Father    Heart disease Father        CAD s/p CABG   Healthy Brother    Healthy Brother    Healthy Brother    Dementia Maternal Grandmother    Hypertension  Maternal Grandmother    Hypercholesterolemia Maternal Grandmother    Heart disease Maternal Grandmother    Healthy Daughter    Healthy Son    Stroke Neg Hx    Cancer Neg Hx    Breast cancer Neg Hx     Past medical history, surgical history, medications, allergies, family history and social history reviewed with patient today and changes made to appropriate areas of the chart.   ROS  All other ROS negative except what is listed above and in the HPI.      Objective:    BP 121/77 (BP Location: Right Arm, Patient Position: Sitting, Cuff Size: Large)   Pulse 67   Ht 5' 1 (1.549 m)   Wt 194 lb (88 kg)   SpO2 97%   BMI 36.66 kg/m   Wt Readings from Last 3 Encounters:  07/15/23 194 lb (88 kg)  06/30/23 194 lb 6.4 oz (88.2 kg)  04/22/23 196 lb 12.8 oz (89.3 kg)    No results found.  Physical Exam Vitals and nursing note reviewed.  Constitutional:  General: She is not in acute distress.    Appearance: Normal appearance. She is normal weight. She is not ill-appearing, toxic-appearing or diaphoretic.  HENT:     Head: Normocephalic.     Right Ear: External ear normal.     Left Ear: External ear normal.     Nose: Nose normal.     Mouth/Throat:     Mouth: Mucous membranes are moist.     Pharynx: Oropharynx is clear.  Eyes:     General:        Right eye: No discharge.        Left eye: No discharge.     Extraocular Movements: Extraocular movements intact.     Conjunctiva/sclera: Conjunctivae normal.     Pupils: Pupils are equal, round, and reactive to light.  Cardiovascular:     Rate and Rhythm: Normal rate and regular rhythm.     Heart sounds: No murmur heard. Pulmonary:     Effort: Pulmonary effort is normal. No respiratory distress.     Breath sounds: Normal breath sounds. No wheezing or rales.  Musculoskeletal:     Cervical back: Normal range of motion and neck supple.  Skin:    General: Skin is warm and dry.     Capillary Refill: Capillary refill takes less  than 2 seconds.  Neurological:     General: No focal deficit present.     Mental Status: She is alert and oriented to person, place, and time. Mental status is at baseline.  Psychiatric:        Mood and Affect: Mood normal.        Behavior: Behavior normal.        Thought Content: Thought content normal.        Judgment: Judgment normal.        07/15/2023    1:27 PM 03/05/2021    5:52 PM  6CIT Screen  What Year? 0 points 0 points  What month? 0 points 0 points  What time? 0 points 0 points  Count back from 20 0 points 0 points  Months in reverse 0 points 0 points  Repeat phrase 0 points 6 points  Total Score 0 points 6 points     Results for orders placed or performed in visit on 06/30/23  Microscopic Examination   Collection Time: 06/30/23 11:44 AM   Urine  Result Value Ref Range   WBC, UA None seen 0 - 5 /hpf   RBC, Urine 0-2 0 - 2 /hpf   Epithelial Cells (non renal) 0-10 0 - 10 /hpf   Bacteria, UA None seen None seen/Few  Urinalysis, Routine w reflex microscopic   Collection Time: 06/30/23 11:44 AM  Result Value Ref Range   Specific Gravity, UA 1.015 1.005 - 1.030   pH, UA 6.5 5.0 - 7.5   Color, UA Yellow Yellow   Appearance Ur Clear Clear   Leukocytes,UA Negative Negative   Protein,UA Negative Negative/Trace   Glucose, UA Negative Negative   Ketones, UA Negative Negative   RBC, UA 1+ (A) Negative   Bilirubin, UA Negative Negative   Urobilinogen, Ur 0.2 0.2 - 1.0 mg/dL   Nitrite, UA Negative Negative   Microscopic Examination See below:   CBC with Differential/Platelet   Collection Time: 06/30/23 11:46 AM  Result Value Ref Range   WBC 5.7 3.4 - 10.8 x10E3/uL   RBC 3.87 3.77 - 5.28 x10E6/uL   Hemoglobin 13.3 11.1 - 15.9 g/dL   Hematocrit 60.1 65.9 - 46.6 %  MCV 103 (H) 79 - 97 fL   MCH 34.4 (H) 26.6 - 33.0 pg   MCHC 33.4 31.5 - 35.7 g/dL   RDW 87.8 88.2 - 84.5 %   Platelets CANCELED x10E3/uL   Neutrophils 48 Not Estab. %   Lymphs 39 Not Estab. %    Monocytes 9 Not Estab. %   Eos 3 Not Estab. %   Basos 1 Not Estab. %   Neutrophils Absolute 2.8 1.4 - 7.0 x10E3/uL   Lymphocytes Absolute 2.2 0.7 - 3.1 x10E3/uL   Monocytes Absolute 0.5 0.1 - 0.9 x10E3/uL   EOS (ABSOLUTE) 0.2 0.0 - 0.4 x10E3/uL   Basophils Absolute 0.1 0.0 - 0.2 x10E3/uL   Immature Granulocytes 0 Not Estab. %   Immature Grans (Abs) 0.0 0.0 - 0.1 x10E3/uL   Hematology Comments: Note:   Comprehensive metabolic panel   Collection Time: 06/30/23 11:46 AM  Result Value Ref Range   Glucose 99 70 - 99 mg/dL   BUN 8 6 - 24 mg/dL   Creatinine, Ser 9.32 0.57 - 1.00 mg/dL   eGFR 894 >40 fO/fpw/8.26   BUN/Creatinine Ratio 12 9 - 23   Sodium 138 134 - 144 mmol/L   Potassium 3.9 3.5 - 5.2 mmol/L   Chloride 101 96 - 106 mmol/L   CO2 23 20 - 29 mmol/L   Calcium  9.6 8.7 - 10.2 mg/dL   Total Protein 6.3 6.0 - 8.5 g/dL   Albumin 4.2 3.8 - 4.9 g/dL   Globulin, Total 2.1 1.5 - 4.5 g/dL   Bilirubin Total 0.3 0.0 - 1.2 mg/dL   Alkaline Phosphatase 79 44 - 121 IU/L   AST 9 0 - 40 IU/L   ALT 7 0 - 32 IU/L  Lipid panel   Collection Time: 06/30/23 11:46 AM  Result Value Ref Range   Cholesterol, Total 205 (H) 100 - 199 mg/dL   Triglycerides 79 0 - 149 mg/dL   HDL 51 >60 mg/dL   VLDL Cholesterol Cal 14 5 - 40 mg/dL   LDL Chol Calc (NIH) 859 (H) 0 - 99 mg/dL   Chol/HDL Ratio 4.0 0.0 - 4.4 ratio  TSH   Collection Time: 06/30/23 11:46 AM  Result Value Ref Range   TSH 2.580 0.450 - 4.500 uIU/mL  T4, free   Collection Time: 06/30/23 11:46 AM  Result Value Ref Range   Free T4 1.42 0.82 - 1.77 ng/dL  Vitamin D  (25 hydroxy)   Collection Time: 06/30/23 11:46 AM  Result Value Ref Range   Vit D, 25-Hydroxy 22.7 (L) 30.0 - 100.0 ng/mL      Assessment & Plan:   Problem List Items Addressed This Visit       Respiratory   COPD (chronic obstructive pulmonary disease) with emphysema (HCC)   Chronic.  Controlled.  Continue with current medication regimen of Bevspi and PRN Albuterol .   Followed by Pulmonology, Dr. Tamea.  Labs ordered today.  Return to clinic in 6 months for reevaluation.  Call sooner if concerns arise.  Working to get CT Lung scheduled.      Relevant Orders   Comp Met (CMET)     Endocrine   Follicular cancer of thyroid  (HCC)   Chronic.  Continue with current of Levothyroxine  200mcg daily.  Labs ordered today. Follow up in 6 months.  Call sooner if concerns arise.       Relevant Orders   Comp Met (CMET)   TSH   T4, free     Other  High cholesterol   Chronic.  Controlled.  Continue with current medication regimen of Atorvastatin  80mg .  Labs ordered today.  Return to clinic in 6 months for reevaluation.  Call sooner if concerns arise.       Relevant Orders   Lipid panel   Advanced care planning/counseling discussion - Primary   A voluntary discussion about advance care planning including the explanation and discussion of advance directives was extensively discussed  with the patient for 3 minutes with patient and 3 present.  Explanation about the health care proxy and Living will was reviewed and packet with forms with explanation of how to fill them out was given.        Vitamin D  deficiency   Labs ordered at visit today.  Will make recommendations based on lab results.        Relevant Orders   Comp Met (CMET)   Vitamin D  (25 hydroxy)   Other Visit Diagnoses       Encounter for annual wellness exam in Medicare patient            Preventative Services:  AAA screening: NA Health Risk Assessment and Personalized Prevention Plan: Up to date Bone Mass Measurements: NA Breast Cancer Screening: Up to date CVD Screening: Up to date Cervical Cancer Screening: NA Colon Cancer Screening: Up to date Depression Screening: Up to date Diabetes Screening: Up to date Glaucoma Screening: NA Hepatitis B vaccine: NA Hepatitis C screening: Up to date HIV Screening: Up to date Flu Vaccine: Refused Lung cancer Screening: Trying to  schedule Obesity Screening: Up to date Pneumonia Vaccines (2): Refused STI Screening: NA  Follow up plan: Return for Keep appt for July.   LABORATORY TESTING:  - Pap smear: not applicable  IMMUNIZATIONS:   - Tdap: Tetanus vaccination status reviewed: Not up to date. - Influenza: Refused - Pneumovax: Refused - Prevnar: Refused - Zostavax vaccine: Refused  SCREENING: -Mammogram: Up to date  - Colonoscopy: Up to date  - Bone Density: Not applicable  -Hearing Test: Not applicable  -Spirometry: Not applicable   PATIENT COUNSELING:   Advised to take 1 mg of folate supplement per day if capable of pregnancy.   Sexuality: Discussed sexually transmitted diseases, partner selection, use of condoms, avoidance of unintended pregnancy  and contraceptive alternatives.   Advised to avoid cigarette smoking.  I discussed with the patient that most people either abstain from alcohol or drink within safe limits (<=14/week and <=4 drinks/occasion for males, <=7/weeks and <= 3 drinks/occasion for females) and that the risk for alcohol disorders and other health effects rises proportionally with the number of drinks per week and how often a drinker exceeds daily limits.  Discussed cessation/primary prevention of drug use and availability of treatment for abuse.   Diet: Encouraged to adjust caloric intake to maintain  or achieve ideal body weight, to reduce intake of dietary saturated fat and total fat, to limit sodium intake by avoiding high sodium foods and not adding table salt, and to maintain adequate dietary potassium and calcium  preferably from fresh fruits, vegetables, and low-fat dairy products.    stressed the importance of regular exercise  Injury prevention: Discussed safety belts, safety helmets, smoke detector, smoking near bedding or upholstery.   Dental health: Discussed importance of regular tooth brushing, flossing, and dental visits.    NEXT PREVENTATIVE PHYSICAL DUE IN 1  YEAR. Return for Keep appt for July.

## 2023-07-15 NOTE — Progress Notes (Deleted)
 There were no vitals taken for this visit.   Subjective:    Patient ID: Janet Murray, female    DOB: 16-Oct-1970, 53 y.o.   MRN: [id number]  HPI: Janet Murray is a 53 y.o. female  No chief complaint on file.  COPD Using Bevspi.  Only using albuterol  PRN.  Working to get CT Lung screening scheduled. COPD status: stable Satisfied with current treatment?: yes Oxygen use: no Dyspnea frequency: not often  Cough frequency: very infrequent  Rescue inhaler frequency:  about 1-2 times per week  Limitation of activity:  does not feel limited very much  Productive cough:  Last Spirometry:  Pneumovax: unknown Influenza: Up to Date   HYPERLIPIDEMIA/ CAD  Hyperlipidemia status: good compliance Satisfied with current treatment?  yes Side effects:  no Medication compliance: good compliance Past cholesterol meds: atorvastain (lipitor) Supplements: none Aspirin:  no The 10-year ASCVD risk score (Arnett DK, et al., 2019) is: 4.6%   Values used to calculate the score:     Age: 63 years     Sex: Female     Is Non-Hispanic African American: No     Diabetic: Yes     Tobacco smoker: Yes     Systolic Blood Pressure: 94 mmHg     Is BP treated: No     HDL Cholesterol: 56 mg/dL     Total Cholesterol: 220 mg/dL Chest pain:  no Coronary artery disease:  yes   HYPOTHYROIDISM Thyroid  control status:stable Satisfied with current treatment? yes Medication side effects: no Medication compliance: good compliance Etiology of hypothyroidism:  Recent dose adjustment:no Fatigue: no Cold intolerance: no Heat intolerance: no Weight gain: yes Weight loss: no Constipation: no Diarrhea/loose stools:  no Palpitations: yes- going on for the last couple of days. Lower extremity edema: no Anxiety/depressed mood: no   Relevant past medical, surgical, family and social history reviewed and updated as indicated. Interim medical history since our last visit reviewed. Allergies and medications  reviewed and updated.  Review of Systems  Per HPI unless specifically indicated above     Objective:    There were no vitals taken for this visit.  Wt Readings from Last 3 Encounters:  06/30/23 194 lb 6.4 oz (88.2 kg)  04/22/23 196 lb 12.8 oz (89.3 kg)  02/10/23 188 lb 6.4 oz (85.5 kg)    Physical Exam  Results for orders placed or performed in visit on 06/30/23  Microscopic Examination   Collection Time: 06/30/23 11:44 AM   Urine  Result Value Ref Range   WBC, UA None seen 0 - 5 /hpf   RBC, Urine 0-2 0 - 2 /hpf   Epithelial Cells (non renal) 0-10 0 - 10 /hpf   Bacteria, UA None seen None seen/Few  Urinalysis, Routine w reflex microscopic   Collection Time: 06/30/23 11:44 AM  Result Value Ref Range   Specific Gravity, UA 1.015 1.005 - 1.030   pH, UA 6.5 5.0 - 7.5   Color, UA Yellow Yellow   Appearance Ur Clear Clear   Leukocytes,UA Negative Negative   Protein,UA Negative Negative/Trace   Glucose, UA Negative Negative   Ketones, UA Negative Negative   RBC, UA 1+ (A) Negative   Bilirubin, UA Negative Negative   Urobilinogen, Ur 0.2 0.2 - 1.0 mg/dL   Nitrite, UA Negative Negative   Microscopic Examination See below:   CBC with Differential/Platelet   Collection Time: 06/30/23 11:46 AM  Result Value Ref Range   WBC 5.7 3.4 - 10.8 x10E3/uL  RBC 3.87 3.77 - 5.28 x10E6/uL   Hemoglobin 13.3 11.1 - 15.9 g/dL   Hematocrit 60.1 65.9 - 46.6 %   MCV 103 (H) 79 - 97 fL   MCH 34.4 (H) 26.6 - 33.0 pg   MCHC 33.4 31.5 - 35.7 g/dL   RDW 87.8 88.2 - 84.5 %   Platelets CANCELED x10E3/uL   Neutrophils 48 Not Estab. %   Lymphs 39 Not Estab. %   Monocytes 9 Not Estab. %   Eos 3 Not Estab. %   Basos 1 Not Estab. %   Neutrophils Absolute 2.8 1.4 - 7.0 x10E3/uL   Lymphocytes Absolute 2.2 0.7 - 3.1 x10E3/uL   Monocytes Absolute 0.5 0.1 - 0.9 x10E3/uL   EOS (ABSOLUTE) 0.2 0.0 - 0.4 x10E3/uL   Basophils Absolute 0.1 0.0 - 0.2 x10E3/uL   Immature Granulocytes 0 Not Estab. %    Immature Grans (Abs) 0.0 0.0 - 0.1 x10E3/uL   Hematology Comments: Note:   Comprehensive metabolic panel   Collection Time: 06/30/23 11:46 AM  Result Value Ref Range   Glucose 99 70 - 99 mg/dL   BUN 8 6 - 24 mg/dL   Creatinine, Ser 9.32 0.57 - 1.00 mg/dL   eGFR 894 >40 fO/fpw/8.26   BUN/Creatinine Ratio 12 9 - 23   Sodium 138 134 - 144 mmol/L   Potassium 3.9 3.5 - 5.2 mmol/L   Chloride 101 96 - 106 mmol/L   CO2 23 20 - 29 mmol/L   Calcium  9.6 8.7 - 10.2 mg/dL   Total Protein 6.3 6.0 - 8.5 g/dL   Albumin 4.2 3.8 - 4.9 g/dL   Globulin, Total 2.1 1.5 - 4.5 g/dL   Bilirubin Total 0.3 0.0 - 1.2 mg/dL   Alkaline Phosphatase 79 44 - 121 IU/L   AST 9 0 - 40 IU/L   ALT 7 0 - 32 IU/L  Lipid panel   Collection Time: 06/30/23 11:46 AM  Result Value Ref Range   Cholesterol, Total 205 (H) 100 - 199 mg/dL   Triglycerides 79 0 - 149 mg/dL   HDL 51 >60 mg/dL   VLDL Cholesterol Cal 14 5 - 40 mg/dL   LDL Chol Calc (NIH) 859 (H) 0 - 99 mg/dL   Chol/HDL Ratio 4.0 0.0 - 4.4 ratio  TSH   Collection Time: 06/30/23 11:46 AM  Result Value Ref Range   TSH 2.580 0.450 - 4.500 uIU/mL  T4, free   Collection Time: 06/30/23 11:46 AM  Result Value Ref Range   Free T4 1.42 0.82 - 1.77 ng/dL  Vitamin D  (25 hydroxy)   Collection Time: 06/30/23 11:46 AM  Result Value Ref Range   Vit D, 25-Hydroxy 22.7 (L) 30.0 - 100.0 ng/mL      Assessment & Plan:   Problem List Items Addressed This Visit       Respiratory   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

## 2023-07-16 ENCOUNTER — Encounter: Payer: Self-pay | Admitting: Nurse Practitioner

## 2023-07-16 LAB — COMPREHENSIVE METABOLIC PANEL
ALT: 10 [IU]/L (ref 0–32)
AST: 13 [IU]/L (ref 0–40)
Albumin: 4.2 g/dL (ref 3.8–4.9)
Alkaline Phosphatase: 74 [IU]/L (ref 44–121)
BUN/Creatinine Ratio: 13 (ref 9–23)
BUN: 7 mg/dL (ref 6–24)
Bilirubin Total: <0.2 mg/dL (ref 0.0–1.2)
CO2: 22 mmol/L (ref 20–29)
Calcium: 9.1 mg/dL (ref 8.7–10.2)
Chloride: 102 mmol/L (ref 96–106)
Creatinine, Ser: 0.55 mg/dL — ABNORMAL LOW (ref 0.57–1.00)
Globulin, Total: 1.8 g/dL (ref 1.5–4.5)
Glucose: 101 mg/dL — ABNORMAL HIGH (ref 70–99)
Potassium: 3.8 mmol/L (ref 3.5–5.2)
Sodium: 138 mmol/L (ref 134–144)
Total Protein: 6 g/dL (ref 6.0–8.5)
eGFR: 110 mL/min/{1.73_m2} (ref >59)

## 2023-07-16 LAB — VITAMIN D 25 HYDROXY (VIT D DEFICIENCY, FRACTURES): Vit D, 25-Hydroxy: 21.1 ng/mL — ABNORMAL LOW (ref 30.0–100.0)

## 2023-07-16 LAB — LIPID PANEL
Chol/HDL Ratio: 4 {ratio} (ref 0.0–4.4)
Cholesterol, Total: 208 mg/dL — ABNORMAL HIGH (ref 100–199)
HDL: 52 mg/dL (ref >39)
LDL Chol Calc (NIH): 141 mg/dL — ABNORMAL HIGH (ref 0–99)
Triglycerides: 84 mg/dL (ref 0–149)
VLDL Cholesterol Cal: 15 mg/dL (ref 5–40)

## 2023-07-16 LAB — T4, FREE: Free T4: 1.37 ng/dL (ref 0.82–1.77)

## 2023-07-16 LAB — TSH: TSH: 1.29 u[IU]/mL (ref 0.450–4.500)

## 2023-07-23 ENCOUNTER — Ambulatory Visit: Payer: Self-pay | Admitting: Nurse Practitioner

## 2023-09-09 ENCOUNTER — Other Ambulatory Visit: Payer: Self-pay | Admitting: Pulmonary Disease

## 2023-09-09 ENCOUNTER — Other Ambulatory Visit: Payer: Self-pay | Admitting: Nurse Practitioner

## 2023-09-09 DIAGNOSIS — K219 Gastro-esophageal reflux disease without esophagitis: Secondary | ICD-10-CM

## 2023-09-11 NOTE — Telephone Encounter (Signed)
 Requested Prescriptions  Pending Prescriptions Disp Refills   esomeprazole (NEXIUM) 40 MG capsule [Pharmacy Med Name: ESOMEPRAZOLE MAGNESIUM 40 MG CAP] 180 capsule 1    Sig: TAKE 1 CAPSULE BY MOUTH TWICE DAILY     Gastroenterology: Proton Pump Inhibitors 2 Passed - 09/11/2023  9:37 AM      Passed - ALT in normal range and within 360 days    ALT  Date Value Ref Range Status  07/15/2023 10 0 - 32 IU/L Final   SGPT (ALT)  Date Value Ref Range Status  10/29/2013 13 12 - 78 U/L Final         Passed - AST in normal range and within 360 days    AST  Date Value Ref Range Status  07/15/2023 13 0 - 40 IU/L Final   SGOT(AST)  Date Value Ref Range Status  10/29/2013 17 15 - 37 Unit/L Final         Passed - Valid encounter within last 12 months    Recent Outpatient Visits           1 month ago Advanced care planning/counseling discussion   [place] [facility] Larae Grooms, NP       Future Appointments             In 3 months Larae Grooms, NP [place] [facility]

## 2023-09-13 ENCOUNTER — Other Ambulatory Visit: Payer: Self-pay

## 2023-09-13 ENCOUNTER — Emergency Department

## 2023-09-13 ENCOUNTER — Emergency Department
Admission: EM | Admit: 2023-09-13 | Discharge: 2023-09-13 | Disposition: A | Discharge status: Home / Self Care | Attending: Emergency Medicine | Admitting: Emergency Medicine

## 2023-09-13 DIAGNOSIS — E039 Hypothyroidism, unspecified: Secondary | ICD-10-CM | POA: Diagnosis not present

## 2023-09-13 DIAGNOSIS — J449 Chronic obstructive pulmonary disease, unspecified: Secondary | ICD-10-CM | POA: Diagnosis not present

## 2023-09-13 DIAGNOSIS — R0789 Other chest pain: Secondary | ICD-10-CM | POA: Insufficient documentation

## 2023-09-13 DIAGNOSIS — R0602 Shortness of breath: Secondary | ICD-10-CM | POA: Diagnosis not present

## 2023-09-13 DIAGNOSIS — J45909 Unspecified asthma, uncomplicated: Secondary | ICD-10-CM | POA: Diagnosis not present

## 2023-09-13 DIAGNOSIS — I2510 Atherosclerotic heart disease of native coronary artery without angina pectoris: Secondary | ICD-10-CM | POA: Insufficient documentation

## 2023-09-13 DIAGNOSIS — R079 Chest pain, unspecified: Secondary | ICD-10-CM

## 2023-09-13 LAB — CBC
HCT: 40.5 % (ref 36.0–46.0)
Hemoglobin: 13.9 g/dL (ref 12.0–15.0)
MCH: 33.7 pg (ref 26.0–34.0)
MCHC: 34.3 g/dL (ref 30.0–36.0)
MCV: 98.3 fL (ref 80.0–100.0)
Platelets: 100 10*3/uL — ABNORMAL LOW (ref 150–400)
RBC: 4.12 MIL/uL (ref 3.87–5.11)
RDW: 12.3 % (ref 11.5–15.5)
WBC: 6.4 10*3/uL (ref 4.0–10.5)
nRBC: 0 % (ref 0.0–0.2)

## 2023-09-13 LAB — RESP PANEL BY RT-PCR (RSV, FLU A&B, COVID)  RVPGX2
Influenza A by PCR: NEGATIVE
Influenza B by PCR: NEGATIVE
Resp Syncytial Virus by PCR: NEGATIVE
SARS Coronavirus 2 by RT PCR: NEGATIVE

## 2023-09-13 LAB — BASIC METABOLIC PANEL WITH GFR
Anion gap: 9 (ref 5–15)
BUN: 9 mg/dL (ref 6–20)
CO2: 29 mmol/L (ref 22–32)
Calcium: 9.6 mg/dL (ref 8.9–10.3)
Chloride: 103 mmol/L (ref 98–111)
Creatinine, Ser: 0.62 mg/dL (ref 0.44–1.00)
GFR, Estimated: >60 mL/min (ref >60)
Glucose, Bld: 116 mg/dL — ABNORMAL HIGH (ref 70–99)
Potassium: 4 mmol/L (ref 3.5–5.1)
Sodium: 141 mmol/L (ref 135–145)

## 2023-09-13 LAB — D-DIMER, QUANTITATIVE: D-Dimer, Quant: <0.27 ug{FEU}/mL (ref 0.00–0.50)

## 2023-09-13 LAB — MAGNESIUM: Magnesium: 2.1 mg/dL (ref 1.7–2.4)

## 2023-09-13 LAB — T4, FREE: Free T4: 1.1 ng/dL (ref 0.61–1.12)

## 2023-09-13 LAB — TROPONIN I (HIGH SENSITIVITY)
Troponin I (High Sensitivity): 3 ng/L (ref <18)
Troponin I (High Sensitivity): 3 ng/L (ref <18)

## 2023-09-13 LAB — TSH: TSH: 0.178 u[IU]/mL — ABNORMAL LOW (ref 0.350–4.500)

## 2023-09-13 NOTE — ED Triage Notes (Signed)
 Patient C/O chest pain, palpitations and SOB that began two weeks ago. Patient denies any cardiac history, is a smoker, and has chronic bronchitis.

## 2023-09-13 NOTE — ED Provider Notes (Signed)
 [facility] Provider Note    Event Date/Time   First MD Initiated Contact with Patient 09/13/23 [phone]     (approximate)   History   Chest Pain and Shortness of Breath   HPI  Janet Murray is a 53 y.o. female with history of emphysema, COPD, CAD, hypothyroidism, previous stomach cancer who presents to the emergency department with complaints of 3 weeks of intermittent chest tightness with shortness of breath.  Has chronic cough which is unchanged.  Symptoms are worse with exertion and better with rest.  Not having any symptoms currently.  No history of PE, DVT, exogenous estrogen use, recent fractures, surgery, trauma, hospitalization, prolonged travel or other immobilization. No lower extremity swelling or pain. No calf tenderness.  States when these episodes happen she will feel very clammy and then flushed all over.   History provided by patient.    Past Medical History:  Diagnosis Date   Abdominal mass, RUQ (right upper quadrant)    Anxiety    Arthritis    Asthma    Bronchitis    Cancer (HCC) 04/12/2021   Carpal tunnel syndrome of left wrist 10/25/2014   Chronic bronchitis, obstructive (HCC) 07/20/2017   Chronic pain    Colon cancer (HCC)    COPD (chronic obstructive pulmonary disease) (HCC)    Coronary artery disease    Cyst of right kidney 03/22/2018   DDD (degenerative disc disease), lumbar 10/25/2014   Degenerative joint disease (DJD) of hip    Diarrhea of infectious origin 03/25/2018   Dysphagia    Dyspnea    Emphysema of lung (HCC)    Facet syndrome, lumbar 10/25/2014   Fatty liver 03/25/2018   Foot fracture, left 05/02/2015   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   GERD (gastroesophageal reflux disease)    Hereditary and idiopathic peripheral neuropathy 11/06/2014   Hypothyroidism    IBS (irritable bowel syndrome)    Low back pain    Migraine 02/04/2016   Occipital neuralgia    Osteoarthritis of spine with radiculopathy, lumbar region  10/25/2014   Other intervertebral disc degeneration, lumbar region    Other irritable bowel syndrome 04/20/2017   Other spondylosis with radiculopathy, lumbar region 10/25/2014   RLS (restless legs syndrome)    Sacroiliac joint dysfunction of both sides 10/25/2014   Spondylosis of lumbar spine    Stomach cancer (HCC)    Urinary incontinence     Past Surgical History:  Procedure Laterality Date   ABDOMINAL HYSTERECTOMY     APPENDECTOMY     BLADDER SURGERY     sling - Dr. Barnabas Lister   CESAREAN SECTION     COLONOSCOPY WITH PROPOFOL N/A 04/11/2020   Procedure: COLONOSCOPY WITH PROPOFOL;  Surgeon: Pasty Spillers, MD;  Location: [facility] ENDOSCOPY;  Service: Endoscopy;  Laterality: N/A;   COLONOSCOPY WITH PROPOFOL N/A 10/22/2021   Procedure: COLONOSCOPY WITH PROPOFOL;  Surgeon: Toney Reil, MD;  Location: [facility] ENDOSCOPY;  Service: Gastroenterology;  Laterality: N/A;   ESOPHAGOGASTRODUODENOSCOPY  04/26/2019   STOMACH SURGERY     THYROIDECTOMY N/A 04/12/2021   Procedure: THYROIDECTOMY, total;  Surgeon: Leafy Ro, MD;  Location: [facility] ORS;  Service: General;  Laterality: N/A;   TOTAL ABDOMINAL HYSTERECTOMY W/ BILATERAL SALPINGOOPHORECTOMY      MEDICATIONS:  Prior to Admission medications   Medication Sig Start Date End Date Taking? Authorizing Provider  albuterol (PROVENTIL) (2.5 MG/3ML) 0.083% nebulizer solution Take 3 mLs (2.5 mg total) by nebulization every 6 (six) hours as needed for wheezing  or shortness of breath. 02/28/21   Parrett, Virgel Bouquet, NP  albuterol (VENTOLIN HFA) 108 (90 Base) MCG/ACT inhaler INHALE 2 PUFFS INTO THE LUNGS EVERY 6 HOURS AS NEEDED FOR WHEEZING OR SHORTNESS OF BREATH 05/21/23   Larae Grooms, NP  AMBULATORY NON FORMULARY MEDICATION Medication Name: nebulizer DX: J44.9 12/07/17   Shane Crutch, MD  atorvastatin (LIPITOR) 80 MG tablet TAKE 1 TABLET BY MOUTH DAILY 07/10/22   Larae Grooms, NP  diclofenac Sodium (VOLTAREN) 1 % GEL Apply 1  application topically daily as needed (Knee pain). 09/18/20   [provider]  esomeprazole (NEXIUM) 40 MG capsule TAKE 1 CAPSULE BY MOUTH TWICE DAILY 09/11/23   Larae Grooms, NP  fluticasone [facility]) 50 MCG/ACT nasal spray Place 2 sprays into both nostrils daily. 12/16/22   Cannady, Corrie Dandy T, NP  Glycopyrrolate-Formoterol (BEVESPI AEROSPHERE) 9-4.8 MCG/ACT AERO USE 2 PUFFS BY MOUTH TWICE DAILY. Please schedule office visit before any future refills. 09/10/23   Salena Saner, MD  levothyroxine (SYNTHROID) 200 MCG tablet Take 1 tablet (200 mcg total) by mouth daily. 04/23/23   Larae Grooms, NP  mirabegron ER (MYRBETRIQ) 50 MG TB24 tablet Take 1 tablet (50 mg total) by mouth daily. 02/10/23   Selmer Dominion, NP  montelukast (SINGULAIR) 10 MG tablet Take 1 tablet (10 mg total) by mouth at bedtime. 06/30/23   Larae Grooms, NP  naloxone [facility]) nasal spray 4 mg/0.1 mL SMARTSIG:Both Nares 06/19/21   [provider]  ondansetron (ZOFRAN-ODT) 4 MG disintegrating tablet TAKE 1 TABLET BY MOUTH EVERY 8 HOURS AS NEEDED 02/16/23   Mecum, Erin E, PA-C  oxyCODONE 10 MG TABS Take 1 tablet (10 mg total) by mouth every 8 (eight) hours. 06/30/23   Larae Grooms, NP  pramipexole (MIRAPEX) 0.5 MG tablet TAKE 1 TABLET BY MOUTH 3 TIMES DAILY 12/16/22   Larae Grooms, NP  tirzepatide [facility]) 5 MG/0.5ML Pen Inject 5 mg into the skin once a week. 06/30/23   Larae Grooms, NP  tiZANidine (ZANAFLEX) 2 MG tablet Take 2 mg by mouth 2 (two) times daily. 09/18/21   [provider]  Vitamin D, Ergocalciferol, (DRISDOL) 1.25 MG (50000 UNIT) CAPS capsule Take 1 capsule (50,000 Units total) by mouth once a week. For 12 weeks. Then start OTC Vitamin D3 2,000 unit daily. 01/16/23   Mecum, Oswaldo Conroy, PA-C    Physical Exam   Triage Vital Signs: ED Triage Vitals  Encounter Vitals Group     BP 09/13/23 0203 115/77     Systolic BP Percentile --      Diastolic BP Percentile --      Pulse Rate  09/13/23 0203 70     Resp 09/13/23 0203 18     Temp 09/13/23 0203 98.6 F (37 C)     Temp Source 09/13/23 0203 Oral     SpO2 09/13/23 0203 95 %     Weight 09/13/23 0141 187 lb (84.8 kg)     Height 09/13/23 0141 5\' 1"  (1.549 m)     Head Circumference --      Peak Flow --      Pain Score --      Pain Loc --      Pain Education --      Exclude from Growth Chart --     Most recent vital signs: Vitals:   09/13/23 0203 09/13/23 0355  BP: 115/77   Pulse: 70 76  Resp: 18 19  Temp: 98.6 F (37 C)   SpO2: 95% 97%  CONSTITUTIONAL: Alert, responds appropriately to questions. Well-appearing; well-nourished HEAD: Normocephalic, atraumatic EYES: Conjunctivae clear, pupils appear equal, sclera nonicteric ENT: normal nose; moist mucous membranes NECK: Supple, normal ROM CARD: RRR; S1 and S2 appreciated RESP: Normal chest excursion without splinting or tachypnea; breath sounds clear and equal bilaterally; no wheezes, no rhonchi, no rales, no hypoxia or respiratory distress, speaking full sentences ABD/GI: Non-distended; soft, non-tender, no rebound, no guarding, no peritoneal signs BACK: The back appears normal EXT: Normal ROM in all joints; no deformity noted, no edema, no calf tenderness or calf swelling SKIN: Normal color for age and race; warm; no rash on exposed skin NEURO: Moves all extremities equally, normal speech PSYCH: The patient's mood and manner are appropriate.   ED Results / Procedures / Treatments   LABS: (all labs ordered are listed, but only abnormal results are displayed) Labs Reviewed  BASIC METABOLIC PANEL WITH GFR - Abnormal; Notable for the following components:      Result Value   Glucose, Bld 116 (*)    All other components within normal limits  CBC - Abnormal; Notable for the following components:   Platelets 100 (*)    All other components within normal limits  TSH - Abnormal; Notable for the following components:   TSH 0.178 (*)    All other  components within normal limits  RESP PANEL BY RT-PCR (RSV, FLU A&B, COVID)  RVPGX2  T4, FREE  MAGNESIUM  D-DIMER, QUANTITATIVE (NOT AT [facility])  TROPONIN I (HIGH SENSITIVITY)  TROPONIN I (HIGH SENSITIVITY)     EKG:  EKG Interpretation Date/Time:  Sunday September 13 2023 01:56:50 EDT Ventricular Rate:  73 PR Interval:  176 QRS Duration:  94 QT Interval:  384 QTC Calculation: 423 R Axis:   66  Text Interpretation: Normal sinus rhythm Incomplete right bundle branch block Borderline ECG When compared with ECG of 22-Aug-2022 17:46, No significant change was found Confirmed by Rochele Raring [phone]) on 09/13/2023 2:42:58 AM         RADIOLOGY: My personal review and interpretation of imaging: Chest x-ray clear.  I have personally reviewed all radiology reports.   DG Chest 2 View Result Date: 09/13/2023 CLINICAL DATA:  Chest pain. EXAM: CHEST - 2 VIEW COMPARISON:  August 22, 2022 FINDINGS: The heart size and mediastinal contours are within normal limits. Both lungs are clear. The visualized skeletal structures are unremarkable. IMPRESSION: No active cardiopulmonary disease. Electronically Signed   By: Aram Candela M.D.   On: 09/13/2023 02:44     PROCEDURES:  Critical Care performed: No     .1-3 Lead EKG Interpretation  Performed by: Madell Heino, Layla Maw, DO Authorized by: Ranada Vigorito, Layla Maw, DO     Interpretation: normal     ECG rate:  76   ECG rate assessment: normal     Rhythm: sinus rhythm     Ectopy: none     Conduction: normal       IMPRESSION / MDM / ASSESSMENT AND PLAN / ED COURSE  I reviewed the triage vital signs and the nursing notes.    Patient here with chest pain, shortness of breath.  Currently asymptomatic.  The patient is on the cardiac monitor to evaluate for evidence of arrhythmia and/or significant heart rate changes.   DIFFERENTIAL DIAGNOSIS (includes but not limited to):   ACS, COPD, PE, less likely dissection, pneumonia, CHF   Patient's  presentation is most consistent with acute presentation with potential threat to life or bodily function.   PLAN: EKG shows  no new ischemic change.  Will obtain cardiac labs, D-dimer, chest x-ray, COVID and flu swab.  Lungs are currently clear to auscultation.  Doubt acute COPD exacerbation at this time.   MEDICATIONS GIVEN IN ED: Medications - No data to display   ED COURSE: Patient's troponin x 2 was negative.  D-dimer negative.  Normal hemoglobin, electrolytes.  COVID, flu and RSV negative.  TSH is low but free T4 is normal.  She does have a history of hypothyroidism.  Chest x-ray reviewed and interpreted by myself and the radiologist and is unremarkable.  Recommended close follow-up with her PCP and cardiologist.  Patient comfortable with this plan.   At this time, I do not feel there is any life-threatening condition present. I reviewed all nursing notes, vitals, pertinent previous records.  All lab and urine results, EKGs, imaging ordered have been independently reviewed and interpreted by myself.  I reviewed all available radiology reports from any imaging ordered this visit.  Based on my assessment, I feel the patient is safe to be discharged home without further emergent workup and can continue workup as an outpatient as needed. Discussed all findings, treatment plan as well as usual and customary return precautions.  They verbalize understanding and are comfortable with this plan.  Outpatient follow-up has been provided as needed.  All questions have been answered.    CONSULTS: Admission considered but workup here is reassuring with 2 negative troponins and patient is currently asymptomatic.  I feel she is appropriate for outpatient management and follow-up with her PCP and cardiologist.  OUTSIDE RECORDS REVIEWED: Reviewed recent family medicine notes in 2025.       FINAL CLINICAL IMPRESSION(S) / ED DIAGNOSES   Final diagnoses:  Chest pain, unspecified type     Rx / DC  Orders   ED Discharge Orders     None        Note:  This document was prepared using Dragon voice recognition software and may include unintentional dictation errors.   Lilyian Quayle, Layla Maw, DO 09/13/23 [phone]

## 2023-09-14 ENCOUNTER — Ambulatory Visit: Payer: Self-pay | Admitting: Nurse Practitioner

## 2023-09-14 NOTE — Telephone Encounter (Signed)
 FYI to provider since chief complaint is chest pain.

## 2023-09-14 NOTE — Telephone Encounter (Signed)
 Chief Complaint: Chest pain x2-3 weeks  Symptoms: Productive cough, white phlegm, sweats, nausea, fatigue, sometimes difficulty breathing with movement Frequency: Intermittent Pertinent Negatives: Patient denies fever, pain radiation, vomiting, dizziness  Disposition:  [x] Appointment(In office)  Additional Notes: Pt was discharged from hospital yesterday morning. Pt states her CP is about the same as it was. Pt currently not having any chest pain. Pt states when the pollen got bad she started having this chest pain heaviness. Pt was in hospital and was told everything "looked good." Pt was told its related to pollen. Pt scheduled for appointment tomorrow with PCP. This RN educated pt on new-worsening symptoms and when to call back/seek emergent care. Pt verbalized understanding and agrees to plan.    Copied from CRM [id number]. Topic: Clinical - Red Word Triage >> Sep 14, 2023  9:45 AM Alessandra Bevels wrote: Red Word that prompted transfer to Nurse Triage: Patient is calling to schedule a  ED Follow up. Still reporting chest pain. Please advise Reason for Disposition  [1] Chest pain lasts < 5 minutes AND [2] NO chest pain or cardiac symptoms (e.g., breathing difficulty, sweating) now  (Exception: Chest pains that last only a few seconds.)  Answer Assessment - Initial Assessment Questions LOCATION: "Where does it hurt?"       Right side/ middle of chest RADIATION: "Does the pain go anywhere else?" (e.g., into neck, jaw, arms, back)     Denies ONSET: "When did the chest pain begin?" (Minutes, hours or days)      2-3 months ago PATTERN: "Does the pain come and go, or has it been constant since it started?"  "Does it get worse with exertion?"      Intermittent DURATION: "How long does it last" (e.g., seconds, minutes, hours)     A couple minutes, gets "heavy" SEVERITY: "How bad is the pain?"  (e.g., Scale 1-10; mild, moderate, or severe)    - MILD (1-3): doesn't interfere with normal activities      - MODERATE (4-7): interferes with normal activities or awakens from sleep    - SEVERE (8-10): excruciating pain, unable to do any normal activities       4/10 intermittent CAUSE: "What do you think is causing the chest pain?"     Pollen OTHER SYMPTOMS: "Do you have any other symptoms?" (e.g., dizziness, nausea, vomiting, sweating, fever, difficulty breathing, cough)       Nausea  Protocols used: Chest Pain-A-AH

## 2023-09-15 ENCOUNTER — Ambulatory Visit (INDEPENDENT_AMBULATORY_CARE_PROVIDER_SITE_OTHER): Admitting: Nurse Practitioner

## 2023-09-15 ENCOUNTER — Encounter: Payer: Self-pay | Admitting: Nurse Practitioner

## 2023-09-15 VITALS — BP 103/69 | HR 71 | Temp 98.7°F | Resp 17 | Ht 60.98 in | Wt 184.4 lb

## 2023-09-15 DIAGNOSIS — R1011 Right upper quadrant pain: Secondary | ICD-10-CM

## 2023-09-15 DIAGNOSIS — K8020 Calculus of gallbladder without cholecystitis without obstruction: Secondary | ICD-10-CM

## 2023-09-15 DIAGNOSIS — E039 Hypothyroidism, unspecified: Secondary | ICD-10-CM | POA: Diagnosis not present

## 2023-09-15 DIAGNOSIS — F419 Anxiety disorder, unspecified: Secondary | ICD-10-CM | POA: Diagnosis not present

## 2023-09-15 MED ORDER — HYDROXYZINE PAMOATE 25 MG PO CAPS: 25.0000 mg | ORAL_CAPSULE | Freq: Three times a day (TID) | ORAL | 0 refills | Status: AC | PRN

## 2023-09-15 MED ORDER — VITAMIN D3 125 MCG (5000 UT) PO CAPS
5000.0000 [IU] | ORAL_CAPSULE | Freq: Every day | ORAL | 1 refills | Status: AC
Start: 2023-09-15 — End: ?

## 2023-09-15 NOTE — Progress Notes (Signed)
 BP 103/69 (BP Location: Left Arm, Patient Position: Sitting, Cuff Size: Normal)   Pulse 71   Temp 98.7 F (37.1 C) (Oral)   Resp 17   Ht 5' 0.98" (1.549 m)   Wt 184 lb 6.4 oz (83.6 kg)   SpO2 97%   BMI 34.86 kg/m    Subjective:    Patient ID: Janet Murray, female    DOB: Jul 25, 1970, 53 y.o.   MRN: [id number]  HPI: Janet Murray is a 53 y.o. female  Chief Complaint  Patient presents with   Hospitalization Follow-up    Started about 3 weeks ago. Sunday ED visit was when things got very bad. Heart heaviness and throat felt like it was closing. Said to have been related to pollen. Bruising from blood draws and one is very painful on right forearm.  Since she has been home feeling "blah". Flonase and singular not working. Restless, tired and chest hurts and can feel her heartbeat. Feels like her heart is skipping a beat, random times.    Patient states her symptoms started about 3 weeks ago.  Reports it feels like her heart is "rolling" over.  Inside of her body gets really hot and then she starts sweating.  Feels like her chest is going "90 miles per hour".  She feels like she is having chest pain and can't breath.  Feels like it might be a panic attack.  States the symptoms aren't all the time but they come and go.    She stopped taking Mounjaro about a week ago but symptoms having improved.    Patient is also having RUQ abdominal pain.  She states she is having nausea.  Denies diarrhea and constipation.  Her appetite has been normal.  She is having gas pains and when she presses on her abdomen it is painful.    Relevant past medical, surgical, family and social history reviewed and updated as indicated. Interim medical history since our last visit reviewed. Allergies and medications reviewed and updated.  Review of Systems  Cardiovascular:  Positive for chest pain and palpitations.  Gastrointestinal:  Positive for abdominal pain and nausea. Negative for constipation, diarrhea and  vomiting.    Per HPI unless specifically indicated above     Objective:    BP 103/69 (BP Location: Left Arm, Patient Position: Sitting, Cuff Size: Normal)   Pulse 71   Temp 98.7 F (37.1 C) (Oral)   Resp 17   Ht 5' 0.98" (1.549 m)   Wt 184 lb 6.4 oz (83.6 kg)   SpO2 97%   BMI 34.86 kg/m   Wt Readings from Last 3 Encounters:  09/15/23 184 lb 6.4 oz (83.6 kg)  09/13/23 187 lb (84.8 kg)  07/15/23 194 lb (88 kg)    Physical Exam Vitals and nursing note reviewed.  Constitutional:      General: She is not in acute distress.    Appearance: Normal appearance. She is normal weight. She is not ill-appearing, toxic-appearing or diaphoretic.  HENT:     Head: Normocephalic.     Right Ear: External ear normal.     Left Ear: External ear normal.     Nose: Nose normal.     Mouth/Throat:     Mouth: Mucous membranes are moist.     Pharynx: Oropharynx is clear.  Eyes:     General:        Right eye: No discharge.        Left eye: No discharge.  Extraocular Movements: Extraocular movements intact.     Conjunctiva/sclera: Conjunctivae normal.     Pupils: Pupils are equal, round, and reactive to light.  Cardiovascular:     Rate and Rhythm: Normal rate and regular rhythm.     Heart sounds: No murmur heard. Pulmonary:     Effort: Pulmonary effort is normal. No respiratory distress.     Breath sounds: Normal breath sounds. No wheezing or rales.  Abdominal:     Tenderness: There is abdominal tenderness in the right upper quadrant. Positive signs include Murphy's sign.  Musculoskeletal:     Cervical back: Normal range of motion and neck supple.  Skin:    General: Skin is warm and dry.     Capillary Refill: Capillary refill takes less than 2 seconds.  Neurological:     General: No focal deficit present.     Mental Status: She is alert and oriented to person, place, and time. Mental status is at baseline.  Psychiatric:        Mood and Affect: Mood normal.        Behavior: Behavior  normal.        Thought Content: Thought content normal.        Judgment: Judgment normal.     Results for orders placed or performed during the hospital encounter of 09/13/23  Resp panel by RT-PCR (RSV, Flu A&B, Covid) Anterior Nasal Swab   Collection Time: 09/13/23  1:44 AM   Specimen: Anterior Nasal Swab  Result Value Ref Range   SARS Coronavirus 2 by RT PCR NEGATIVE NEGATIVE   Influenza A by PCR NEGATIVE NEGATIVE   Influenza B by PCR NEGATIVE NEGATIVE   Resp Syncytial Virus by PCR NEGATIVE NEGATIVE  Basic metabolic panel   Collection Time: 09/13/23  1:44 AM  Result Value Ref Range   Sodium 141 135 - 145 mmol/L   Potassium 4.0 3.5 - 5.1 mmol/L   Chloride 103 98 - 111 mmol/L   CO2 29 22 - 32 mmol/L   Glucose, Bld 116 (H) 70 - 99 mg/dL   BUN 9 6 - 20 mg/dL   Creatinine, Ser 1.61 0.44 - 1.00 mg/dL   Calcium 9.6 8.9 - 09.6 mg/dL   GFR, Estimated >04 >54 mL/min   Anion gap 9 5 - 15  CBC   Collection Time: 09/13/23  1:44 AM  Result Value Ref Range   WBC 6.4 4.0 - 10.5 K/uL   RBC 4.12 3.87 - 5.11 MIL/uL   Hemoglobin 13.9 12.0 - 15.0 g/dL   HCT 09.8 11.9 - 14.7 %   MCV 98.3 80.0 - 100.0 fL   MCH 33.7 26.0 - 34.0 pg   MCHC 34.3 30.0 - 36.0 g/dL   RDW 82.9 56.2 - 13.0 %   Platelets 100 (L) 150 - 400 K/uL   nRBC 0.0 0.0 - 0.2 %  T4, free   Collection Time: 09/13/23  1:44 AM  Result Value Ref Range   Free T4 1.10 0.61 - 1.12 ng/dL  TSH   Collection Time: 09/13/23  1:44 AM  Result Value Ref Range   TSH 0.178 (L) 0.350 - 4.500 uIU/mL  Magnesium   Collection Time: 09/13/23  1:44 AM  Result Value Ref Range   Magnesium 2.1 1.7 - 2.4 mg/dL  Troponin I (High Sensitivity)   Collection Time: 09/13/23  1:44 AM  Result Value Ref Range   Troponin I (High Sensitivity) 3 <18 ng/L  D-dimer, quantitative   Collection Time: 09/13/23  3:41 AM  Result Value Ref Range   D-Dimer, Quant <0.27 0.00 - 0.50 ug/mL-FEU  Troponin I (High Sensitivity)   Collection Time: 09/13/23  3:41 AM   Result Value Ref Range   Troponin I (High Sensitivity) 3 <18 ng/L      Assessment & Plan:   Problem List Items Addressed This Visit       Endocrine   Acquired hypothyroidism - Primary   Chronic.  Will rule out abnormal thyroid labs as cause of symptoms.  Will make recommendations based on results.      Relevant Orders   TSH   T4, free   Other Visit Diagnoses       RUQ pain       [place] ordered for further evaluation and management.   Relevant Orders   US Abdomen Limited RUQ (LIVER/GB)     Anxiety       Suspect symptoms are related to anxiety.  Patient has been on hydroxyzine before. Will restart medication. Follow up in 1 month.  Call sooner if concerns arise.   Relevant Medications   hydrOXYzine (VISTARIL) 25 MG capsule        Follow up plan: Return in about 1 month (around 10/15/2023) for FU .

## 2023-09-15 NOTE — Assessment & Plan Note (Signed)
 Chronic.  Will rule out abnormal thyroid labs as cause of symptoms.  Will make recommendations based on results.

## 2023-09-16 ENCOUNTER — Encounter: Payer: Self-pay | Admitting: Nurse Practitioner

## 2023-09-16 LAB — T4, FREE: Free T4: 2.08 ng/dL — ABNORMAL HIGH (ref 0.82–1.77)

## 2023-09-16 LAB — TSH: TSH: 0.17 u[IU]/mL — ABNORMAL LOW (ref 0.450–4.500)

## 2023-09-16 MED ORDER — LEVOTHYROXINE SODIUM 175 MCG PO TABS: 175.0000 ug | ORAL_TABLET | Freq: Every day | ORAL | 1 refills | Status: DC

## 2023-09-16 NOTE — Addendum Note (Signed)
 Addended by: Larae Grooms on: 09/16/2023 09:41 AM   Modules accepted: Orders

## 2023-09-21 ENCOUNTER — Ambulatory Visit

## 2023-09-24 ENCOUNTER — Other Ambulatory Visit: Payer: Self-pay | Admitting: Nurse Practitioner

## 2023-09-24 ENCOUNTER — Ambulatory Visit

## 2023-09-24 DIAGNOSIS — N63 Unspecified lump in unspecified breast: Secondary | ICD-10-CM

## 2023-09-29 NOTE — Progress Notes (Unsigned)
 Cardiology Office Note    Date:  09/30/2023   ID:  Janet Murray, DOB 02-04-1971, MRN [id number]  PCP:  Aileen Alexanders, NP  Cardiologist:  Constancia Delton, MD  Electrophysiologist:  None   Chief Complaint: ED follow-up  History of Present Illness:   Janet Murray is a 53 y.o. 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  She was evaluated as a new patient in 10/2022 for chest pain after having been evaluated in the ED in 08/2022 with negative high-sensitivity troponin and EKG showing sinus rhythm with an incomplete RBBB without acute ischemic changes.  At the time of her visit with cardiology she had been without further symptoms of angina or cardiac decompensation.  Echo on 12/15/2022 showed an EF of 55 to 60%, no regional wall motion abnormalities, normal LV diastolic function parameters, normal RV systolic function and ventricular cavity size, no significant valvular abnormalities, and an estimated right atrial pressure of 3 mmHg.  Coronary CTA on 01/14/2023 showed a calcium  score of 13.7 which was the 88th percentile.  There was less than 25% stenosis involving the mid LAD.  She was last seen in the office in 01/2023 and was without symptoms of angina or cardiac decompensation.  She had decreased tobacco use to 1/2 pack/day.  She was seen in ED on 09/13/2023 with a 3-week history of intermittent chest tightness with shortness of breath.  Symptoms are worse with exertion and better with rest.  High-sensitivity troponin negative x 2.  D-dimer negative.  EKG showed sinus rhythm without acute ischemic changes with known incomplete RBBB.  Chest x-ray without active cardiopulmonary  disease.  She was discharged to outpatient follow-up.  She comes in today noting a 26-month history of tachypalpitations, particularly when she is startled with associated chest discomfort.  Tachypalpitations will typically last for 2 to 5 minutes.  She also reports some episodes of randomly occurring chest discomfort that are not associate with tachypalpitations.  Chronic dyspnea stable.  No dizziness, recent MI, or syncope.  No falls.  She reports adherence to atorvastatin .  Continues to smoke less than half pack daily.   Labs independently reviewed: 09/2023 - TSH 0.170, free T4 elevated at 2.08 magnesium  2.1, Hgb 13.9, PLT 100, potassium 4.0, BUN 9, serum creatinine 0.62 07/2023 - TC 208, TG 84, HDL 52, LDL 141, albumin 4.2, AST/ALT normal  Past Medical History:  Diagnosis Date   Abdominal mass, RUQ (right upper quadrant)    Anxiety    Arthritis    Asthma    Bronchitis    Cancer (HCC) 04/12/2021   Carpal tunnel syndrome of left wrist 10/25/2014   Chronic bronchitis, obstructive (HCC) 07/20/2017   Chronic pain    Colon cancer (HCC)    COPD (chronic obstructive pulmonary disease) (HCC)    Coronary artery disease    Cyst of right kidney 03/22/2018   DDD (degenerative disc disease), lumbar 10/25/2014   Degenerative joint disease (DJD) of hip    Diarrhea of infectious origin 03/25/2018   Dysphagia    Dyspnea    Emphysema of lung (HCC)    Facet syndrome, lumbar 10/25/2014   Fatty liver 03/25/2018   Foot fracture, left 05/02/2015   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   GERD (  gastroesophageal reflux disease)    Hereditary and idiopathic peripheral neuropathy 11/06/2014   Hypothyroidism    IBS (irritable bowel syndrome)    Low back pain    Migraine 02/04/2016   Occipital neuralgia    Osteoarthritis of spine with radiculopathy, lumbar region 10/25/2014   Other intervertebral disc degeneration, lumbar region    Other irritable bowel syndrome 04/20/2017   Other spondylosis with  radiculopathy, lumbar region 10/25/2014   RLS (restless legs syndrome)    Sacroiliac joint dysfunction of both sides 10/25/2014   Spondylosis of lumbar spine    Stomach cancer (HCC)    Urinary incontinence     Past Surgical History:  Procedure Laterality Date   ABDOMINAL HYSTERECTOMY     APPENDECTOMY     BLADDER SURGERY     sling - Dr. Henrine Logan    CESAREAN SECTION     COLONOSCOPY WITH PROPOFOL  N/A 04/11/2020   Procedure: COLONOSCOPY WITH PROPOFOL ;  Surgeon: Irby Mannan, MD;  Location: [facility] ENDOSCOPY;  Service: Endoscopy;  Laterality: N/A;   COLONOSCOPY WITH PROPOFOL  N/A 10/22/2021   Procedure: COLONOSCOPY WITH PROPOFOL ;  Surgeon: Selena Daily, MD;  Location: [facility] ENDOSCOPY;  Service: Gastroenterology;  Laterality: N/A;   ESOPHAGOGASTRODUODENOSCOPY  04/26/2019   STOMACH SURGERY     THYROIDECTOMY N/A 04/12/2021   Procedure: THYROIDECTOMY, total;  Surgeon: Alben Alma, MD;  Location: [facility] ORS;  Service: General;  Laterality: N/A;   TOTAL ABDOMINAL HYSTERECTOMY W/ BILATERAL SALPINGOOPHORECTOMY      Current Medications: Current Meds  Medication Sig   albuterol  (PROVENTIL ) (2.5 MG/3ML) 0.083% nebulizer solution Take 3 mLs (2.5 mg total) by nebulization every 6 (six) hours as needed for wheezing or shortness of breath.   albuterol  (VENTOLIN  HFA) 108 (90 Base) MCG/ACT inhaler INHALE 2 PUFFS INTO THE LUNGS EVERY 6 HOURS AS NEEDED FOR WHEEZING OR SHORTNESS OF BREATH   AMBULATORY NON FORMULARY MEDICATION Medication Name: nebulizer DX: J44.9   atorvastatin  (LIPITOR) 80 MG tablet TAKE 1 TABLET BY MOUTH DAILY   Cholecalciferol (VITAMIN D3) 125 MCG (5000 UT) CAPS Take 1 capsule (5,000 Units total) by mouth daily.   diclofenac Sodium (VOLTAREN) 1 % GEL Apply 1 application topically daily as needed (Knee pain).   esomeprazole  (NEXIUM ) 40 MG capsule TAKE 1 CAPSULE BY MOUTH TWICE DAILY   ezetimibe  (ZETIA ) 10 MG tablet Take 1 tablet (10 mg total) by mouth daily.   fluticasone   (FLONASE ) 50 MCG/ACT nasal spray Place 2 sprays into both nostrils daily.   Glycopyrrolate -Formoterol  (BEVESPI  AEROSPHERE) 9-4.8 MCG/ACT AERO USE 2 PUFFS BY MOUTH TWICE DAILY. Please schedule office visit before any future refills.   hydrOXYzine  (VISTARIL ) 25 MG capsule Take 1 capsule (25 mg total) by mouth every 8 (eight) hours as needed.   levothyroxine  (SYNTHROID ) 175 MCG tablet Take 1 tablet (175 mcg total) by mouth daily.   mirabegron  ER (MYRBETRIQ ) 50 MG TB24 tablet Take 1 tablet (50 mg total) by mouth daily.   montelukast  (SINGULAIR ) 10 MG tablet Take 1 tablet (10 mg total) by mouth at bedtime.   naloxone (NARCAN) nasal spray 4 mg/0.1 mL SMARTSIG:Both Nares   ondansetron  (ZOFRAN -ODT) 4 MG disintegrating tablet TAKE 1 TABLET BY MOUTH EVERY 8 HOURS AS NEEDED   oxyCODONE  10 MG TABS Take 1 tablet (10 mg total) by mouth every 8 (eight) hours.   pramipexole  (MIRAPEX ) 0.5 MG tablet TAKE 1 TABLET BY MOUTH 3 TIMES DAILY   tirzepatide (MOUNJARO) 5 MG/0.5ML Pen Inject 5 mg into the skin once a week.   tiZANidine  (  ZANAFLEX ) 2 MG tablet Take 2 mg by mouth 2 (two) times daily.    Allergies:   Penicillins, Acetaminophen , Ciprofloxacin , Aspirin, Hydrocodone , and Tape   Social History   Socioeconomic History   Marital status: Single    Spouse name: Not on file   Number of children: 2   Years of education: Not on file   Highest education level: 9th grade  Occupational History   Not on file  Tobacco Use   Smoking status: Every Day    Current packs/day: 0.50    Average packs/day: 0.5 packs/day for 25.0 years (12.5 ttl pk-yrs)    Types: Cigarettes   Smokeless tobacco: Never   Tobacco comments:    0.5PPD 11/11/2022 khj  Vaping Use   Vaping status: Never Used  Substance and Sexual Activity   Alcohol use: Not Currently    Comment: occassionally   Drug use: No   Sexual activity: Not Currently    Birth control/protection: Surgical    Comment: Hysterectomy  Other Topics Concern   Not on file   Social History Narrative   Lives alone   Social Drivers of Health   Financial Resource Strain: Patient Declined (07/15/2023)   Overall Financial Resource Strain (CARDIA)    Difficulty of Paying Living Expenses: Patient declined  Food Insecurity: Patient Declined (07/15/2023)   Hunger Vital Sign    Worried About Running Out of Food in the Last Year: Patient declined    Ran Out of Food in the Last Year: Patient declined  Transportation Needs: No Transportation Needs (07/15/2023)   PRAPARE - Administrator, Civil Service (Medical): No    Lack of Transportation (Non-Medical): No  Physical Activity: Insufficiently Active (07/15/2023)   Exercise Vital Sign    Days of Exercise per Week: 3 days    Minutes of Exercise per Session: 10 min  Stress: No Stress Concern Present (07/15/2023)   [employer] of Occupational Health - Occupational Stress Questionnaire    Feeling of Stress : Not at all  Social Connections: Moderately Isolated (07/15/2023)   Social Connection and Isolation Panel [NHANES]    Frequency of Communication with Friends and Family: More than three times a week    Frequency of Social Gatherings with Friends and Family: More than three times a week    Attends Religious Services: Never    Database administrator or Organizations: No    Attends Engineer, structural: 1 to 4 times per year    Marital Status: Divorced     Family History:  The patient's family history includes Arthritis in her father; Atrial fibrillation in her mother; Dementia in her maternal grandmother; Healthy in her brother, brother, brother, daughter, and son; Heart disease in her father and maternal grandmother; Hypercholesterolemia in her maternal grandmother; Hypertension in her father and maternal grandmother; Leukemia in her father; Neuropathy in her mother. There is no history of Stroke, Cancer, or Breast cancer.  ROS:   12-point review of systems is negative unless otherwise noted in the  HPI.   EKGs/Labs/Other Studies Reviewed:    Studies reviewed were summarized above. The additional studies were reviewed today:  01/14/2023: FINDINGS: Aorta: Normal size. Mild ascending aortic wall calcifications. No dissection.   Aortic Valve:  Trileaflet.  No calcifications.   Coronary Arteries:  Normal coronary origin.  Right dominance.   RCA is a dominant artery. There is no plaque.   Left main gives rise to LAD and LCX arteries. LM has no disease.  LAD has calcified plaque in the mid vessel causing minimal stenosis (<25%).   LCX is a non-dominant artery.  There is no plaque.   Other findings:   Normal pulmonary vein drainage into the left atrium.   Normal left atrial appendage without a thrombus.   Normal size of the pulmonary artery.   IMPRESSION: 1. Coronary calcium  score of 13.7. This was 88th percentile for age and sex matched control. 2. Normal coronary origin with right dominance. 3. Minimal stenosis in mid LAD (<25%). 4. CAD-RADS 1. Minimal non-obstructive CAD (0-24%). Consider preventive therapy and risk factor modification. __________   2D echo 12/15/2022: 1. Left ventricular ejection fraction, by estimation, is 55 to 60%. Left  ventricular ejection fraction by 2D MOD biplane is 56.8 %. The left  ventricle has normal function. The left ventricle has no regional wall  motion abnormalities. Left ventricular  diastolic parameters were normal. The average left ventricular global  longitudinal strain is -18.5 %. The global longitudinal strain is normal.   2. Right ventricular systolic function is normal. The right ventricular  size is normal.   3. The mitral valve is normal in structure. No evidence of mitral valve  regurgitation.   4. The aortic valve is tricuspid. Aortic valve regurgitation is not  visualized.   5. The inferior vena cava is normal in size with greater than 50%  respiratory variability, suggesting right atrial pressure of 3 mmHg.     EKG:  EKG is not ordered today.    Recent Labs: 07/15/2023: ALT 10 09/13/2023: BUN 9; Creatinine, Ser 0.62; Hemoglobin 13.9; Magnesium  2.1; Platelets 100; Potassium 4.0; Sodium 141 09/15/2023: TSH 0.170  Recent Lipid Panel    Component Value Date/Time   CHOL 208 (H) 07/15/2023 1335   TRIG 84 07/15/2023 1335   HDL 52 07/15/2023 1335   CHOLHDL 4.0 07/15/2023 1335   LDLCALC 141 (H) 07/15/2023 1335    PHYSICAL EXAM:    VS:  BP 116/62   Pulse 85   Ht 5\' 1"  (1.549 m)   Wt 183 lb (83 kg)   SpO2 98%   BMI 34.58 kg/m   BMI: Body mass index is 34.58 kg/m.  Physical Exam Vitals reviewed.  Constitutional:      Appearance: She is well-developed.  HENT:     Head: Normocephalic and atraumatic.  Eyes:     General:        Right eye: No discharge.        Left eye: No discharge.  Cardiovascular:     Rate and Rhythm: Normal rate and regular rhythm.     Heart sounds: Normal heart sounds, S1 normal and S2 normal. Heart sounds not distant. No midsystolic click and no opening snap. No murmur heard.    No friction rub.  Pulmonary:     Effort: Pulmonary effort is normal. No respiratory distress.     Breath sounds: Normal breath sounds. No decreased breath sounds, wheezing, rhonchi or rales.  Chest:     Chest wall: No tenderness.  Musculoskeletal:     Cervical back: Normal range of motion.  Skin:    General: Skin is warm and dry.     Nails: There is no clubbing.  Neurological:     Mental Status: She is alert and oriented to person, place, and time.  Psychiatric:        Speech: Speech normal.        Behavior: Behavior normal.        Thought Content: Thought content  normal.        Judgment: Judgment normal.     Wt Readings from Last 3 Encounters:  09/30/23 183 lb (83 kg)  09/15/23 184 lb 6.4 oz (83.6 kg)  09/13/23 187 lb (84.8 kg)     ASSESSMENT & PLAN:   Nonobstructive CAD with precordial pain: Currently without symptoms of angina or cardiac decompensation.  Recent coronary  CTA showed mild nonobstructive CAD with less than 25% mid LAD stenosis.  However, she does continue to smoke half pack per day.  Schedule Lexiscan MPI to evaluate for high risk ischemia.  Query if his symptoms are exacerbated/brought on by tachypalpitations.  Aggressive risk factor modification and primary prevention recommended.  Palpitations: Possibly exacerbated by underlying elevated free T4 levels.  Levothyroxine  has subsequently been reduced.  Query if palpitations will improve with this over time.  Place Zio patch.  Recommendations regarding pharmacotherapy based on results.  HLD: LDL 141 in 07/2023 with normal AST/ALT at that time.  Target LDL less than 70.  She reports adherence to atorvastatin  80 mg daily, even when labs were obtained in February.  In this setting add ezetimibe  10 mg daily with continuation of atorvastatin .  Recommend recheck fasting lipid panel and LFT in 2 months.  If LDL remains above goal of 70 at that time we will need to pursue PCSK9 inhibitor.  COPD/asthma with ongoing tobacco use: No active exacerbation.  Complete smoking cessation recommended.    Informed Consent   Shared Decision Making/Informed Consent{  The risks [chest pain, shortness of breath, cardiac arrhythmias, dizziness, blood pressure fluctuations, myocardial infarction, stroke/transient ischemic attack, nausea, vomiting, allergic reaction, radiation exposure, metallic taste sensation and life-threatening complications (estimated to be 1 in 10,000)], benefits (risk stratification, diagnosing coronary artery disease, treatment guidance) and alternatives of a nuclear stress test were discussed in detail with Janet Murray and she agrees to proceed.       Disposition: F/u with Dr. Junnie Olives or an APP in 2 months.   Medication Adjustments/Labs and Tests Ordered: Current medicines are reviewed at length with the patient today.  Concerns regarding medicines are outlined above. Medication changes, Labs and  Tests ordered today are summarized above and listed in the Patient Instructions accessible in Encounters.   Signed, Varney Gentleman, PA-C 09/30/2023 2:06 PM     [facility] [place] [address] [place], [place] [address] [phone]

## 2023-09-30 ENCOUNTER — Ambulatory Visit
Admission: RE | Admit: 2023-09-30 | Discharge: 2023-09-30 | Disposition: A | Discharge status: Home / Self Care | Source: Ambulatory Visit | Attending: Nurse Practitioner | Admitting: Nurse Practitioner

## 2023-09-30 ENCOUNTER — Ambulatory Visit: Attending: Physician Assistant | Admitting: Physician Assistant

## 2023-09-30 ENCOUNTER — Encounter: Payer: Self-pay | Admitting: Physician Assistant

## 2023-09-30 ENCOUNTER — Ambulatory Visit

## 2023-09-30 VITALS — BP 116/62 | HR 85 | Ht 61.0 in | Wt 183.0 lb

## 2023-09-30 DIAGNOSIS — J449 Chronic obstructive pulmonary disease, unspecified: Secondary | ICD-10-CM | POA: Diagnosis not present

## 2023-09-30 DIAGNOSIS — R072 Precordial pain: Secondary | ICD-10-CM | POA: Diagnosis not present

## 2023-09-30 DIAGNOSIS — R002 Palpitations: Secondary | ICD-10-CM

## 2023-09-30 DIAGNOSIS — N63 Unspecified lump in unspecified breast: Secondary | ICD-10-CM | POA: Diagnosis present

## 2023-09-30 DIAGNOSIS — N6322 Unspecified lump in the left breast, upper inner quadrant: Secondary | ICD-10-CM | POA: Insufficient documentation

## 2023-09-30 DIAGNOSIS — Z720 Tobacco use: Secondary | ICD-10-CM

## 2023-09-30 DIAGNOSIS — I2510 Atherosclerotic heart disease of native coronary artery without angina pectoris: Secondary | ICD-10-CM | POA: Diagnosis not present

## 2023-09-30 DIAGNOSIS — E785 Hyperlipidemia, unspecified: Secondary | ICD-10-CM

## 2023-09-30 MED ORDER — EZETIMIBE 10 MG PO TABS: 10.0000 mg | ORAL_TABLET | Freq: Every day | ORAL | 3 refills | Status: DC

## 2023-09-30 NOTE — Patient Instructions (Signed)
 Medication Instructions:  Your physician recommends the following medication changes.  START TAKING: Zetia  10 mg once daily  *If you need a refill on your cardiac medications before your next appointment, please call your pharmacy*  Testing/Procedures: Your provider has ordered a Lexiscan / Exercise Myoview  Stress test. This will take place at [facility]. Please report to the [facility] medical mall entrance. The volunteers at the first desk will direct you where to go.  [facility] MYOVIEW   Your provider has ordered a Stress Test with nuclear imaging. The purpose of this test is to evaluate the blood supply to your heart muscle. This procedure is referred to as a "Non-Invasive Stress Test." This is because other than having an IV started in your vein, nothing is inserted or "invades" your body. Cardiac stress tests are done to find areas of poor blood flow to the heart by determining the extent of coronary artery disease (CAD). Some patients exercise on a treadmill, which naturally increases the blood flow to your heart, while others who are unable to walk on a treadmill due to physical limitations will have a pharmacologic/chemical stress agent called Lexiscan  . This medicine will mimic walking on a treadmill by temporarily increasing your coronary blood flow.   Please note: these test may take anywhere between 2-4 hours to complete  How to prepare for your Myoview  test:  Nothing to eat for 6 hours prior to the test No caffeine for 24 hours prior to test No smoking 24 hours prior to test. Your medication may be taken with water.  If your doctor stopped a medication because of this test, do not take that medication. Ladies, please do not wear dresses.  Skirts or pants are appropriate. Please wear a short sleeve shirt. No perfume, cologne or lotion. Wear comfortable walking shoes. No heels!   PLEASE NOTIFY THE OFFICE AT LEAST 24 HOURS IN ADVANCE IF YOU ARE UNABLE TO KEEP YOUR APPOINTMENT.  [phone] AND   PLEASE NOTIFY NUCLEAR MEDICINE AT [facility] AT LEAST 24 HOURS IN ADVANCE IF YOU ARE UNABLE TO KEEP YOUR APPOINTMENT. [phone]   Your physician has recommended that you wear a Zio monitor.   This monitor is a medical device that records the heart's electrical activity. Doctors most often use these monitors to diagnose arrhythmias. Arrhythmias are problems with the speed or rhythm of the heartbeat. The monitor is a small device applied to your chest. You can wear one while you do your normal daily activities. While wearing this monitor if you have any symptoms to push the button and record what you felt. Once you have worn this monitor for the period of time provider prescribed (Usually 14 days), you will return the monitor device in the postage paid box. Once it is returned they will download the data collected and provide us  with a report which the provider will then review and we will call you with those results. Important tips:  Avoid showering during the first 24 hours of wearing the monitor. Avoid excessive sweating to help maximize wear time. Do not submerge the device, no hot tubs, and no swimming pools. Keep any lotions or oils away from the patch. After 24 hours you may shower with the patch on. Take brief showers with your back facing the shower head.  Do not remove patch once it has been placed because that will interrupt data and decrease adhesive wear time. Push the button when you have any symptoms and write down what you were feeling. Once you have completed wearing  your monitor, remove and place into box which has postage paid and place in your outgoing mailbox.  If for some reason you have misplaced your box then call our office and we can provide another box and/or mail it off for you.   Follow-Up: At [facility], you and your health needs are our priority.  As part of our continuing mission to provide you with exceptional heart care, our providers are all part of one  team.  This team includes your primary Cardiologist (physician) and Advanced Practice Providers or APPs (Physician Assistants and Nurse Practitioners) who all work together to provide you with the care you need, when you need it.  Your next appointment:   2 month(s)  Provider:   You may see Constancia Delton, MD or one of the following Advanced Practice Providers on your designated Care Team:   Laneta Pintos, NP Gildardo Labrador, PA-C Varney Gentleman, PA-C Cadence Woodbury, PA-C Ronald Cockayne, NP Morey Ar, NP    We recommend signing up for the patient portal called "MyChart".  Sign up information is provided on this After Visit Summary.  MyChart is used to connect with patients for Virtual Visits (Telemedicine).  Patients are able to view lab/test results, encounter notes, upcoming appointments, etc.  Non-urgent messages can be sent to your provider as well.   To learn more about what you can do with MyChart, go to [email or web address].

## 2023-10-02 ENCOUNTER — Ambulatory Visit
Admission: RE | Admit: 2023-10-02 | Discharge: 2023-10-02 | Disposition: A | Discharge status: Home / Self Care | Source: Ambulatory Visit | Attending: Nurse Practitioner | Admitting: Nurse Practitioner

## 2023-10-02 DIAGNOSIS — R1011 Right upper quadrant pain: Secondary | ICD-10-CM | POA: Diagnosis present

## 2023-10-06 ENCOUNTER — Encounter: Payer: Self-pay | Admitting: Nurse Practitioner

## 2023-10-06 NOTE — Addendum Note (Signed)
 Addended by: Aileen Alexanders on: 10/06/2023 10:57 AM   Modules accepted: Orders

## 2023-10-07 ENCOUNTER — Telehealth: Payer: Self-pay | Admitting: Surgery

## 2023-10-07 ENCOUNTER — Telehealth: Payer: Self-pay

## 2023-10-07 ENCOUNTER — Encounter: Payer: Self-pay | Admitting: Surgery

## 2023-10-07 ENCOUNTER — Telehealth: Payer: Self-pay | Admitting: Physician Assistant

## 2023-10-07 ENCOUNTER — Ambulatory Visit (INDEPENDENT_AMBULATORY_CARE_PROVIDER_SITE_OTHER): Admitting: Surgery

## 2023-10-07 VITALS — BP 116/70 | HR 72 | Temp 99.0°F | Ht 61.0 in | Wt 184.2 lb

## 2023-10-07 DIAGNOSIS — K8020 Calculus of gallbladder without cholecystitis without obstruction: Secondary | ICD-10-CM | POA: Diagnosis not present

## 2023-10-07 NOTE — Telephone Encounter (Signed)
 Faxed cardiac clearance to Varney Gentleman PA-C at [phone].

## 2023-10-07 NOTE — Telephone Encounter (Signed)
   Pre-operative Risk Assessment    Patient Name: Janet Murray  DOB: [birth year] MRN: [id number]  Date of last office visit: 09/30/23 Date of next office visit: 12/01/23   Request for Surgical Clearance    Procedure:   ROBOTIC CHOLECYSTECTOMY  Date of Surgery:  Clearance 10/22/23                               Surgeon:  DR Evelia Hipp Surgeon's Group or Practice Name:  [facility] Phone number:  [phone] Fax number:  [phone]  Type of Clearance Requested:   - Medical    Type of Anesthesia:  General    Additional requests/questions:    SignedMae Schlossman   10/07/2023, 4:08 PM

## 2023-10-07 NOTE — H&P (View-Only) (Signed)
 Patient ID: Janet Murray, female   DOB: September 19, 1970, 53 y.o.   MRN: [id number]  HPI Janet Murray is a 53 y.o. female seen in consultation at the request of Mrs. Holdsworth NP for gallstones.  Reports right upper quadrant pain for several weeks. Intermittent , moderate and sharp. She does have associated nausea. Exacerbated by heavy meals, subsides on its own. She Did have a recent ultrasound that I have personally reviewed showing a 3 mm polyp and small gallstones.  Normal common bile duct.  She does have a pending myocardial scan. EKG showing sinus rhythm with an incomplete RBBB without acute ischemic changes. Echo EF 55% He does have atypical chest pain and was recently seen by cardiology who ordered her a Myoview She does have multiple medical issues including anxiety, CAD, COPD, history of colon cancer, thyroidectomy for thyroid  cancer.  TSH is low, free t4 a bit high, FTs were normal Lower abdominal operations include appendectomy, hysterectomy S He is an active smoker HPI  Past Medical History:  Diagnosis Date   Abdominal mass, RUQ (right upper quadrant)    Anxiety    Arthritis    Asthma    Bronchitis    Cancer (HCC) 04/12/2021   Carpal tunnel syndrome of left wrist 10/25/2014   Chronic bronchitis, obstructive (HCC) 07/20/2017   Chronic pain    Colon cancer (HCC)    COPD (chronic obstructive pulmonary disease) (HCC)    Coronary artery disease    Cyst of right kidney 03/22/2018   DDD (degenerative disc disease), lumbar 10/25/2014   Degenerative joint disease (DJD) of hip    Diarrhea of infectious origin 03/25/2018   Dysphagia    Dyspnea    Emphysema of lung (HCC)    Facet syndrome, lumbar 10/25/2014   Fatty liver 03/25/2018   Foot fracture, left 05/02/2015   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   GERD (gastroesophageal reflux disease)    Hereditary and idiopathic peripheral neuropathy 11/06/2014   Hypothyroidism    IBS (irritable bowel syndrome)    Low back pain    Migraine  02/04/2016   Occipital neuralgia    Osteoarthritis of spine with radiculopathy, lumbar region 10/25/2014   Other intervertebral disc degeneration, lumbar region    Other irritable bowel syndrome 04/20/2017   Other spondylosis with radiculopathy, lumbar region 10/25/2014   RLS (restless legs syndrome)    Sacroiliac joint dysfunction of both sides 10/25/2014   Spondylosis of lumbar spine    Stomach cancer (HCC)    Urinary incontinence     Past Surgical History:  Procedure Laterality Date   ABDOMINAL HYSTERECTOMY     APPENDECTOMY     BLADDER SURGERY     sling - Dr. Arthuro Lasso    CESAREAN SECTION     COLONOSCOPY WITH PROPOFOL  N/A 04/11/2020   Procedure: COLONOSCOPY WITH PROPOFOL ;  Surgeon: Irby Mannan, MD;  Location: [facility] ENDOSCOPY;  Service: Endoscopy;  Laterality: N/A;   COLONOSCOPY WITH PROPOFOL  N/A 10/22/2021   Procedure: COLONOSCOPY WITH PROPOFOL ;  Surgeon: Selena Daily, MD;  Location: [facility] ENDOSCOPY;  Service: Gastroenterology;  Laterality: N/A;   ESOPHAGOGASTRODUODENOSCOPY  04/26/2019   STOMACH SURGERY     THYROIDECTOMY N/A 04/12/2021   Procedure: THYROIDECTOMY, total;  Surgeon: Alben Alma, MD;  Location: [facility] ORS;  Service: General;  Laterality: N/A;   TOTAL ABDOMINAL HYSTERECTOMY W/ BILATERAL SALPINGOOPHORECTOMY      Family History  Problem Relation Age of Onset   Neuropathy Mother    Atrial fibrillation Mother    Arthritis Father  Hypertension Father    Leukemia Father    Heart disease Father        CAD s/p CABG   Healthy Brother    Healthy Brother    Healthy Brother    Dementia Maternal Grandmother    Hypertension Maternal Grandmother    Hypercholesterolemia Maternal Grandmother    Heart disease Maternal Grandmother    Healthy Daughter    Healthy Son    Stroke Neg Hx    Cancer Neg Hx    Breast cancer Neg Hx     Social History Social History   Tobacco Use   Smoking status: Every Day    Current packs/day: 0.50    Average packs/day:  0.5 packs/day for 25.0 years (12.5 ttl pk-yrs)    Types: Cigarettes   Smokeless tobacco: Never   Tobacco comments:    0.5PPD 11/11/2022 khj  Vaping Use   Vaping status: Never Used  Substance Use Topics   Alcohol use: Not Currently    Comment: occassionally   Drug use: No    Allergies  Allergen Reactions   Penicillins Hives and Swelling    Has patient had a PCN reaction causing immediate rash, facial/tongue/throat swelling, SOB or lightheadedness with hypotension: Yes Has patient had a PCN reaction causing severe rash involving mucus membranes or skin necrosis: No Has patient had a PCN reaction that required hospitalization No Has patient had a PCN reaction occurring within the last 10 years: No If all of the above answers are "NO", then may proceed with Cephalosporin use.   Acetaminophen      Due to polyps    Ciprofloxacin  Itching   Aspirin Palpitations    Heart palpitations   Hydrocodone  Hives and Rash   Tape Rash    Per patient "clear tape"    Current Outpatient Medications  Medication Sig Dispense Refill   albuterol  (PROVENTIL ) (2.5 MG/3ML) 0.083% nebulizer solution Take 3 mLs (2.5 mg total) by nebulization every 6 (six) hours as needed for wheezing or shortness of breath. 75 mL 12   albuterol  (VENTOLIN  HFA) 108 (90 Base) MCG/ACT inhaler INHALE 2 PUFFS INTO THE LUNGS EVERY 6 HOURS AS NEEDED FOR WHEEZING OR SHORTNESS OF BREATH 8.5 g 4   AMBULATORY NON FORMULARY MEDICATION Medication Name: nebulizer DX: J44.9 1 each 0   atorvastatin  (LIPITOR) 80 MG tablet TAKE 1 TABLET BY MOUTH DAILY 90 tablet 3   Cholecalciferol (VITAMIN D3) 125 MCG (5000 UT) CAPS Take 1 capsule (5,000 Units total) by mouth daily. 90 capsule 1   diclofenac Sodium (VOLTAREN) 1 % GEL Apply 1 application topically daily as needed (Knee pain).     esomeprazole  (NEXIUM ) 40 MG capsule TAKE 1 CAPSULE BY MOUTH TWICE DAILY 180 capsule 1   ezetimibe  (ZETIA ) 10 MG tablet Take 1 tablet (10 mg total) by mouth daily. 90  tablet 3   fluticasone  (FLONASE ) 50 MCG/ACT nasal spray Place 2 sprays into both nostrils daily. 16 g 6   Glycopyrrolate -Formoterol  (BEVESPI  AEROSPHERE) 9-4.8 MCG/ACT AERO USE 2 PUFFS BY MOUTH TWICE DAILY. Please schedule office visit before any future refills. 10.7 g 0   hydrOXYzine  (VISTARIL ) 25 MG capsule Take 1 capsule (25 mg total) by mouth every 8 (eight) hours as needed. 30 capsule 0   levothyroxine  (SYNTHROID ) 175 MCG tablet Take 1 tablet (175 mcg total) by mouth daily. 90 tablet 1   mirabegron  ER (MYRBETRIQ ) 50 MG TB24 tablet Take 1 tablet (50 mg total) by mouth daily. 30 tablet 5   montelukast  (SINGULAIR ) 10 MG  tablet Take 1 tablet (10 mg total) by mouth at bedtime. 90 tablet 1   naloxone (NARCAN) nasal spray 4 mg/0.1 mL SMARTSIG:Both Nares     ondansetron  (ZOFRAN -ODT) 4 MG disintegrating tablet TAKE 1 TABLET BY MOUTH EVERY 8 HOURS AS NEEDED 30 tablet 0   oxyCODONE  10 MG TABS Take 1 tablet (10 mg total) by mouth every 8 (eight) hours.     pramipexole  (MIRAPEX ) 0.5 MG tablet TAKE 1 TABLET BY MOUTH 3 TIMES DAILY 270 tablet 0   tirzepatide (MOUNJARO) 5 MG/0.5ML Pen Inject 5 mg into the skin once a week. 6 mL 1   tiZANidine  (ZANAFLEX ) 2 MG tablet Take 2 mg by mouth 2 (two) times daily.     No current facility-administered medications for this visit.     Review of Systems Full ROS  was asked and was negative except for the information on the HPI  Physical Exam There were no vitals taken for this visit. CONSTITUTIONAL: NAD. EYES: Pupils are equal, round, and reactive to light, Sclera are non-icteric. EARS, NOSE, MOUTH AND THROAT: The oropharynx is clear. The oral mucosa is pink and moist. Hearing is intact to voice. Thyroidectomy scar healed, no neck nodes or seromas LYMPH NODES:  Lymph nodes in the neck are normal. RESPIRATORY:  Lungs are clear. There is normal respiratory effort, with equal breath sounds bilaterally, and without pathologic use of accessory muscles. CARDIOVASCULAR:  Heart is regular without murmurs, gallops, or rubs. GI: The abdomen is  soft, mild TTP RUQ w/o peritonitis or Murphy. Tere are no palpable masses. There is no hepatosplenomegaly. There are normal bowel sounds in all quadrants. Pfannenstiel scar. GU: Rectal deferred.   MUSCULOSKELETAL: Normal muscle strength and tone. No cyanosis or edema.   SKIN: Turgor is good and there are no pathologic skin lesions or ulcers. NEUROLOGIC: Motor and sensation is grossly normal. Cranial nerves are grossly intact. PSYCH:  Oriented to person, place and time. Affect is normal.  Data Reviewed  I have personally reviewed the patient's imaging, laboratory findings and medical records.    Assessment/Plan 53 year old female with symptoms assisting with biliary colic.  She does have stones on ultrasound.  I do recommend cholecystectomy.   She will need to complete cardiology w/u. Procedure discussed with the patient in detail. The risks, benefits, complications, treatment options, and expected outcomes were discussed with the patient. The possibilities of bleeding, recurrent infection, finding a normal gallbladder, perforation of viscus organs, damage to surrounding structures, bile leak, abscess formation, needing a drain placed, the need for additional procedures, reaction to medication, pulmonary aspiration,  failure to diagnose a condition, the possible need to convert to an open procedure, and creating a complication requiring transfusion or operation were discussed with the patient. The patient and/or family concurred with the proposed plan, giving informed consent.  We will schedule the surgery at her convenience after cardiology has optimized her  I spent 60 minutes in this encounter including personally reviewing imaging studies, coordinating her care, placing orders, counseling the patient and performing documentation    Evelia Hipp, MD FACS General Surgeon 10/07/2023, 11:22 AM

## 2023-10-07 NOTE — Telephone Encounter (Signed)
 Patient has been advised of Pre-Admission date/time, and Surgery date at [facility].  Surgery Date: 10/22/23 Preadmission Testing Date: 10/14/23 (phone 1p-4p)  Patient informed of the scheduling process for surgery and information given at time of office visit.  Patient has been made aware to call [phone], between 1-3:00pm the day before surgery, to find out what time to arrive for surgery.

## 2023-10-07 NOTE — Patient Instructions (Signed)
 You have requested to have your gallbladder removed. This will be done at [facility] with Dr. Everlene Farrier.  If you are on any injectable weight loss medication, you will need to stop taking your GLP-1 injectable (weight loss) medications 8 days before your surgery to avoid any complications with anesthesia.   You will most likely be out of work 1-2 weeks for this surgery.  If you have FMLA or disability paperwork that needs filled out you may drop this off at our office or this can be faxed to [phone].  You will return after your post-op appointment with a lifting restriction for approximately 4 more weeks.  You will be able to eat anything you would like to following surgery. But, start by eating a bland diet and advance this as tolerated. The Gallbladder diet is below, please go as closely by this diet as possible prior to surgery to avoid any further attacks.  Please see the (blue)pre-care form that you have been given today. Our surgery scheduler will call you to verify surgery date and to go over information.   If you have any questions, please call our office.  Laparoscopic Cholecystectomy Laparoscopic cholecystectomy is surgery to remove the gallbladder. The gallbladder is located in the upper right part of the abdomen, behind the liver. It is a storage sac for bile, which is produced in the liver. Bile aids in the digestion and absorption of fats. Cholecystectomy is often done for inflammation of the gallbladder (cholecystitis). This condition is usually caused by a buildup of gallstones (cholelithiasis) in the gallbladder. Gallstones can block the flow of bile, and that can result in inflammation and pain. In severe cases, emergency surgery may be required. If emergency surgery is not required, you will have time to prepare for the procedure. Laparoscopic surgery is an alternative to open surgery. Laparoscopic surgery has a shorter recovery time. Your common bile duct may also need  to be examined during the procedure. If stones are found in the common bile duct, they may be removed. LET [facility] CARE PROVIDER KNOW ABOUT: Any allergies you have. All medicines you are taking, including vitamins, herbs, eye drops, creams, and over-the-counter medicines. Previous problems you or members of your family have had with the use of anesthetics. Any blood disorders you have. Previous surgeries you have had.  Any medical conditions you have. RISKS AND COMPLICATIONS Generally, this is a safe procedure. However, problems may occur, including: Infection. Bleeding. Allergic reactions to medicines. Damage to other structures or organs. A stone remaining in the common bile duct. A bile leak from the cyst duct that is clipped when your gallbladder is removed. The need to convert to open surgery, which requires a larger incision in the abdomen. This may be necessary if your surgeon thinks that it is not safe to continue with a laparoscopic procedure. BEFORE THE PROCEDURE Ask your health care provider about: Changing or stopping your regular medicines. This is especially important if you are taking diabetes medicines or blood thinners. Taking medicines such as aspirin and ibuprofen. These medicines can thin your blood. Do not take these medicines before your procedure if your health care provider instructs you not to. Follow instructions from your health care provider about eating or drinking restrictions. Let your health care provider know if you develop a cold or an infection before surgery. Plan to have someone take you home after the procedure. Ask your health care provider how your surgical site will be marked or identified. You may  be given antibiotic medicine to help prevent infection. PROCEDURE To reduce your risk of infection: Your health care team will wash or sanitize their hands. Your skin will be washed with soap. An IV tube may be inserted into one of your  veins. You will be given a medicine to make you fall asleep (general anesthetic). A breathing tube will be placed in your mouth. The surgeon will make several small cuts (incisions) in your abdomen. A thin, lighted tube (laparoscope) that has a tiny camera on the end will be inserted through one of the small incisions. The camera on the laparoscope will send a picture to a TV screen (monitor) in the operating room. This will give the surgeon a good view inside your abdomen. A gas will be pumped into your abdomen. This will expand your abdomen to give the surgeon more room to perform the surgery. Other tools that are needed for the procedure will be inserted through the other incisions. The gallbladder will be removed through one of the incisions. After your gallbladder has been removed, the incisions will be closed with stitches (sutures), staples, or skin glue. Your incisions may be covered with a bandage (dressing). The procedure may vary among health care providers and hospitals. AFTER THE PROCEDURE Your blood pressure, heart rate, breathing rate, and blood oxygen level will be monitored often until the medicines you were given have worn off. You will be given medicines as needed to control your pain.   This information is not intended to replace advice given to you by your health care provider. Make sure you discuss any questions you have with your health care provider.   Document Released: 05/26/2005 Document Revised: 02/14/2015 Document Reviewed: 01/05/2013 Elsevier Interactive Patient Education 2016 [employer] Inc[employer]   Low-Fat Diet for Gallbladder Conditions A low-fat diet can be helpful if you have pancreatitis or a gallbladder condition. With these conditions, your pancreas and gallbladder have trouble digesting fats. A healthy eating plan with less fat will help rest your pancreas and gallbladder and reduce your symptoms. WHAT DO I NEED TO KNOW ABOUT THIS DIET? Eat a low-fat  diet. Reduce your fat intake to less than 20-30% of your total daily calories. This is less than 50-60 g of fat per day. Remember that you need some fat in your diet. Ask your dietician what your daily goal should be. Choose nonfat and low-fat healthy foods. Look for the words "nonfat," "low fat," or "fat free." As a guide, look on the label and choose foods with less than 3 g of fat per serving. Eat only one serving. Avoid alcohol. Do not smoke. If you need help quitting, talk with your health care provider. Eat small frequent meals instead of three large heavy meals. WHAT FOODS CAN I EAT? Grains Include healthy grains and starches such as potatoes, wheat bread, fiber-rich cereal, and brown rice. Choose whole grain options whenever possible. In adults, whole grains should account for 45-65% of your daily calories.  Fruits and Vegetables Eat plenty of fruits and vegetables. Fresh fruits and vegetables add fiber to your diet. Meats and Other Protein Sources Eat lean meat such as chicken and pork. Trim any fat off of meat before cooking it. Eggs, fish, and beans are other sources of protein. In adults, these foods should account for 10-35% of your daily calories. Dairy Choose low-fat milk and dairy options. Dairy includes fat and protein, as well as calcium.  Fats and Oils Limit high-fat foods such as fried foods, sweets, baked  goods, sugary drinks.  Other Creamy sauces and condiments, such as mayonnaise, can add extra fat. Think about whether or not you need to use them, or use smaller amounts or low fat options. WHAT FOODS ARE NOT RECOMMENDED? High fat foods, such as: [employer]. Ice cream. [place] toast. Sweet rolls. Pizza. Cheese bread. Foods covered with batter, butter, creamy sauces, or cheese. Fried foods. Sugary drinks and desserts. Foods that cause gas or bloating   This information is not intended to replace advice given to you by your health care provider. Make sure you  discuss any questions you have with your health care provider.   Document Released: 05/31/2013 Document Reviewed: 05/31/2013 Elsevier Interactive Patient Education [employer]

## 2023-10-07 NOTE — Progress Notes (Signed)
 Patient ID: Janet Murray, female   DOB: September 19, 1970, 53 y.o.   MRN: [id number]  HPI Janet Murray is a 53 y.o. female seen in consultation at the request of Mrs. Holdsworth NP for gallstones.  Reports right upper quadrant pain for several weeks. Intermittent , moderate and sharp. She does have associated nausea. Exacerbated by heavy meals, subsides on its own. She Did have a recent ultrasound that I have personally reviewed showing a 3 mm polyp and small gallstones.  Normal common bile duct.  She does have a pending myocardial scan. EKG showing sinus rhythm with an incomplete RBBB without acute ischemic changes. Echo EF 55% He does have atypical chest pain and was recently seen by cardiology who ordered her a Myoview She does have multiple medical issues including anxiety, CAD, COPD, history of colon cancer, thyroidectomy for thyroid  cancer.  TSH is low, free t4 a bit high, FTs were normal Lower abdominal operations include appendectomy, hysterectomy S He is an active smoker HPI  Past Medical History:  Diagnosis Date   Abdominal mass, RUQ (right upper quadrant)    Anxiety    Arthritis    Asthma    Bronchitis    Cancer (HCC) 04/12/2021   Carpal tunnel syndrome of left wrist 10/25/2014   Chronic bronchitis, obstructive (HCC) 07/20/2017   Chronic pain    Colon cancer (HCC)    COPD (chronic obstructive pulmonary disease) (HCC)    Coronary artery disease    Cyst of right kidney 03/22/2018   DDD (degenerative disc disease), lumbar 10/25/2014   Degenerative joint disease (DJD) of hip    Diarrhea of infectious origin 03/25/2018   Dysphagia    Dyspnea    Emphysema of lung (HCC)    Facet syndrome, lumbar 10/25/2014   Fatty liver 03/25/2018   Foot fracture, left 05/02/2015   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   GERD (gastroesophageal reflux disease)    Hereditary and idiopathic peripheral neuropathy 11/06/2014   Hypothyroidism    IBS (irritable bowel syndrome)    Low back pain    Migraine  02/04/2016   Occipital neuralgia    Osteoarthritis of spine with radiculopathy, lumbar region 10/25/2014   Other intervertebral disc degeneration, lumbar region    Other irritable bowel syndrome 04/20/2017   Other spondylosis with radiculopathy, lumbar region 10/25/2014   RLS (restless legs syndrome)    Sacroiliac joint dysfunction of both sides 10/25/2014   Spondylosis of lumbar spine    Stomach cancer (HCC)    Urinary incontinence     Past Surgical History:  Procedure Laterality Date   ABDOMINAL HYSTERECTOMY     APPENDECTOMY     BLADDER SURGERY     sling - Dr. Arthuro Lasso    CESAREAN SECTION     COLONOSCOPY WITH PROPOFOL  N/A 04/11/2020   Procedure: COLONOSCOPY WITH PROPOFOL ;  Surgeon: Irby Mannan, MD;  Location: [facility] ENDOSCOPY;  Service: Endoscopy;  Laterality: N/A;   COLONOSCOPY WITH PROPOFOL  N/A 10/22/2021   Procedure: COLONOSCOPY WITH PROPOFOL ;  Surgeon: Selena Daily, MD;  Location: [facility] ENDOSCOPY;  Service: Gastroenterology;  Laterality: N/A;   ESOPHAGOGASTRODUODENOSCOPY  04/26/2019   STOMACH SURGERY     THYROIDECTOMY N/A 04/12/2021   Procedure: THYROIDECTOMY, total;  Surgeon: Alben Alma, MD;  Location: [facility] ORS;  Service: General;  Laterality: N/A;   TOTAL ABDOMINAL HYSTERECTOMY W/ BILATERAL SALPINGOOPHORECTOMY      Family History  Problem Relation Age of Onset   Neuropathy Mother    Atrial fibrillation Mother    Arthritis Father  Hypertension Father    Leukemia Father    Heart disease Father        CAD s/p CABG   Healthy Brother    Healthy Brother    Healthy Brother    Dementia Maternal Grandmother    Hypertension Maternal Grandmother    Hypercholesterolemia Maternal Grandmother    Heart disease Maternal Grandmother    Healthy Daughter    Healthy Son    Stroke Neg Hx    Cancer Neg Hx    Breast cancer Neg Hx     Social History Social History   Tobacco Use   Smoking status: Every Day    Current packs/day: 0.50    Average packs/day:  0.5 packs/day for 25.0 years (12.5 ttl pk-yrs)    Types: Cigarettes   Smokeless tobacco: Never   Tobacco comments:    0.5PPD 11/11/2022 khj  Vaping Use   Vaping status: Never Used  Substance Use Topics   Alcohol use: Not Currently    Comment: occassionally   Drug use: No    Allergies  Allergen Reactions   Penicillins Hives and Swelling    Has patient had a PCN reaction causing immediate rash, facial/tongue/throat swelling, SOB or lightheadedness with hypotension: Yes Has patient had a PCN reaction causing severe rash involving mucus membranes or skin necrosis: No Has patient had a PCN reaction that required hospitalization No Has patient had a PCN reaction occurring within the last 10 years: No If all of the above answers are "NO", then may proceed with Cephalosporin use.   Acetaminophen      Due to polyps    Ciprofloxacin  Itching   Aspirin Palpitations    Heart palpitations   Hydrocodone  Hives and Rash   Tape Rash    Per patient "clear tape"    Current Outpatient Medications  Medication Sig Dispense Refill   albuterol  (PROVENTIL ) (2.5 MG/3ML) 0.083% nebulizer solution Take 3 mLs (2.5 mg total) by nebulization every 6 (six) hours as needed for wheezing or shortness of breath. 75 mL 12   albuterol  (VENTOLIN  HFA) 108 (90 Base) MCG/ACT inhaler INHALE 2 PUFFS INTO THE LUNGS EVERY 6 HOURS AS NEEDED FOR WHEEZING OR SHORTNESS OF BREATH 8.5 g 4   AMBULATORY NON FORMULARY MEDICATION Medication Name: nebulizer DX: J44.9 1 each 0   atorvastatin  (LIPITOR) 80 MG tablet TAKE 1 TABLET BY MOUTH DAILY 90 tablet 3   Cholecalciferol (VITAMIN D3) 125 MCG (5000 UT) CAPS Take 1 capsule (5,000 Units total) by mouth daily. 90 capsule 1   diclofenac Sodium (VOLTAREN) 1 % GEL Apply 1 application topically daily as needed (Knee pain).     esomeprazole  (NEXIUM ) 40 MG capsule TAKE 1 CAPSULE BY MOUTH TWICE DAILY 180 capsule 1   ezetimibe  (ZETIA ) 10 MG tablet Take 1 tablet (10 mg total) by mouth daily. 90  tablet 3   fluticasone  (FLONASE ) 50 MCG/ACT nasal spray Place 2 sprays into both nostrils daily. 16 g 6   Glycopyrrolate -Formoterol  (BEVESPI  AEROSPHERE) 9-4.8 MCG/ACT AERO USE 2 PUFFS BY MOUTH TWICE DAILY. Please schedule office visit before any future refills. 10.7 g 0   hydrOXYzine  (VISTARIL ) 25 MG capsule Take 1 capsule (25 mg total) by mouth every 8 (eight) hours as needed. 30 capsule 0   levothyroxine  (SYNTHROID ) 175 MCG tablet Take 1 tablet (175 mcg total) by mouth daily. 90 tablet 1   mirabegron  ER (MYRBETRIQ ) 50 MG TB24 tablet Take 1 tablet (50 mg total) by mouth daily. 30 tablet 5   montelukast  (SINGULAIR ) 10 MG  tablet Take 1 tablet (10 mg total) by mouth at bedtime. 90 tablet 1   naloxone (NARCAN) nasal spray 4 mg/0.1 mL SMARTSIG:Both Nares     ondansetron  (ZOFRAN -ODT) 4 MG disintegrating tablet TAKE 1 TABLET BY MOUTH EVERY 8 HOURS AS NEEDED 30 tablet 0   oxyCODONE  10 MG TABS Take 1 tablet (10 mg total) by mouth every 8 (eight) hours.     pramipexole  (MIRAPEX ) 0.5 MG tablet TAKE 1 TABLET BY MOUTH 3 TIMES DAILY 270 tablet 0   tirzepatide (MOUNJARO) 5 MG/0.5ML Pen Inject 5 mg into the skin once a week. 6 mL 1   tiZANidine  (ZANAFLEX ) 2 MG tablet Take 2 mg by mouth 2 (two) times daily.     No current facility-administered medications for this visit.     Review of Systems Full ROS  was asked and was negative except for the information on the HPI  Physical Exam There were no vitals taken for this visit. CONSTITUTIONAL: NAD. EYES: Pupils are equal, round, and reactive to light, Sclera are non-icteric. EARS, NOSE, MOUTH AND THROAT: The oropharynx is clear. The oral mucosa is pink and moist. Hearing is intact to voice. Thyroidectomy scar healed, no neck nodes or seromas LYMPH NODES:  Lymph nodes in the neck are normal. RESPIRATORY:  Lungs are clear. There is normal respiratory effort, with equal breath sounds bilaterally, and without pathologic use of accessory muscles. CARDIOVASCULAR:  Heart is regular without murmurs, gallops, or rubs. GI: The abdomen is  soft, mild TTP RUQ w/o peritonitis or Murphy. Tere are no palpable masses. There is no hepatosplenomegaly. There are normal bowel sounds in all quadrants. Pfannenstiel scar. GU: Rectal deferred.   MUSCULOSKELETAL: Normal muscle strength and tone. No cyanosis or edema.   SKIN: Turgor is good and there are no pathologic skin lesions or ulcers. NEUROLOGIC: Motor and sensation is grossly normal. Cranial nerves are grossly intact. PSYCH:  Oriented to person, place and time. Affect is normal.  Data Reviewed  I have personally reviewed the patient's imaging, laboratory findings and medical records.    Assessment/Plan 53 year old female with symptoms assisting with biliary colic.  She does have stones on ultrasound.  I do recommend cholecystectomy.   She will need to complete cardiology w/u. Procedure discussed with the patient in detail. The risks, benefits, complications, treatment options, and expected outcomes were discussed with the patient. The possibilities of bleeding, recurrent infection, finding a normal gallbladder, perforation of viscus organs, damage to surrounding structures, bile leak, abscess formation, needing a drain placed, the need for additional procedures, reaction to medication, pulmonary aspiration,  failure to diagnose a condition, the possible need to convert to an open procedure, and creating a complication requiring transfusion or operation were discussed with the patient. The patient and/or family concurred with the proposed plan, giving informed consent.  We will schedule the surgery at her convenience after cardiology has optimized her  I spent 60 minutes in this encounter including personally reviewing imaging studies, coordinating her care, placing orders, counseling the patient and performing documentation    Evelia Hipp, MD FACS General Surgeon 10/07/2023, 11:22 AM

## 2023-10-12 DIAGNOSIS — G894 Chronic pain syndrome: Secondary | ICD-10-CM | POA: Diagnosis not present

## 2023-10-13 ENCOUNTER — Other Ambulatory Visit: Payer: Self-pay | Admitting: Pulmonary Disease

## 2023-10-14 ENCOUNTER — Encounter
Admission: RE | Admit: 2023-10-14 | Discharge: 2023-10-14 | Disposition: A | Discharge status: Home / Self Care | Source: Ambulatory Visit | Attending: Surgery | Admitting: Surgery

## 2023-10-14 ENCOUNTER — Other Ambulatory Visit: Payer: Self-pay

## 2023-10-14 HISTORY — DX: Carpal tunnel syndrome, right upper limb: G56.01

## 2023-10-14 HISTORY — DX: Calculus of bile duct without cholangitis or cholecystitis without obstruction: K80.50

## 2023-10-14 HISTORY — DX: Angina pectoris, unspecified: I20.9

## 2023-10-14 HISTORY — DX: Ganglion, right wrist: M67.431

## 2023-10-14 HISTORY — DX: Cardiac arrhythmia, unspecified: I49.9

## 2023-10-14 NOTE — Patient Instructions (Addendum)
 Your procedure is scheduled on: 10/22/23 - Thursday Report to the Registration Desk on the 1st floor of the [facility]. To find out your arrival time, please call [phone] between 1PM - 3PM on: 10/21/23 - Wednesday If your arrival time is 6:00 am, do not arrive before that time as the [facility] entrance doors do not open until 6:00 am.  REMEMBER: Instructions that are not followed completely may result in serious medical risk, up to and including death; or upon the discretion of your surgeon and anesthesiologist your surgery may need to be rescheduled.  Do not eat food after midnight the night before surgery.  No gum chewing or hard candies.  You may however, drink CLEAR liquids up to 2 hours before you are scheduled to arrive for your surgery. Do not drink anything within 2 hours of your scheduled arrival time.  Clear liquids include: - water  - apple juice without pulp - gatorade (not RED colors) - black coffee or tea (Do NOT add milk or creamers to the coffee or tea) Do NOT drink anything that is not on this list.  One week prior to surgery: Stop Anti-inflammatories (NSAIDS) such as Advil, Aleve, Ibuprofen, Motrin, Naproxen, Naprosyn and Aspirin based products such as Excedrin, Goody's Powder, BC Powder. You may take Tylenol  if needed for pain up until the day of surgery.  Stop ANY OVER THE COUNTER supplements until after surgery.  HOLD tirzepatide (MOUNJARO) 7 days prior to your procedure.  ON THE DAY OF SURGERY ONLY TAKE THESE MEDICATIONS WITH SIPS OF WATER:  albuterol  (PROVENTIL ) (2.5 MG/3ML) 0.083% nebulizer  esomeprazole  (NEXIUM )  BEVESPI  AEROSPHERE  levothyroxine  (SYNTHROID )  oxyCODONE   tiZANidine  (ZANAFLEX )  hydrOXYzine  (VISTARIL ) if needed  Use inhaler albuterol  (VENTOLIN  HFA)  on the day of surgery and bring to the hospital.   No Alcohol for 24 hours before or after surgery.  No Smoking including e-cigarettes for 24 hours before surgery.  No chewable  tobacco products for at least 6 hours before surgery.  No nicotine  patches on the day of surgery.  Do not use any "recreational" drugs for at least a week (preferably 2 weeks) before your surgery.  Please be advised that the combination of cocaine and anesthesia may have negative outcomes, up to and including death. If you test positive for cocaine, your surgery will be cancelled.  On the morning of surgery brush your teeth with toothpaste and water, you may rinse your mouth with mouthwash if you wish. Do not swallow any toothpaste or mouthwash.  Use CHG Soap or wipes as directed on instruction sheet.  Do not wear jewelry, make-up, hairpins, clips or nail polish.  For welded (permanent) jewelry: bracelets, anklets, waist bands, etc.  Please have this removed prior to surgery.  If it is not removed, there is a chance that hospital personnel will need to cut it off on the day of surgery.  Do not wear lotions, powders, or perfumes.   Do not shave body hair from the neck down 48 hours before surgery.  Contact lenses, hearing aids and dentures may not be worn into surgery.  Do not bring valuables to the hospital. [facility] is not responsible for any missing/lost belongings or valuables.   Notify your doctor if there is any change in your medical condition (cold, fever, infection).  Wear comfortable clothing (specific to your surgery type) to the hospital.  After surgery, you can help prevent lung complications by doing breathing exercises.  Take deep breaths and cough  every 1-2 hours. Your doctor may order a device called an Incentive Spirometer to help you take deep breaths.  When coughing or sneezing, hold a pillow firmly against your incision with both hands. This is called "splinting." Doing this helps protect your incision. It also decreases belly discomfort.  If you are being admitted to the hospital overnight, leave your suitcase in the car. After surgery it may be brought to  your room.  In case of increased patient census, it may be necessary for you, the patient, to continue your postoperative care in the Same Day Surgery department.  If you are being discharged the day of surgery, you will not be allowed to drive home. You will need a responsible individual to drive you home and stay with you for 53 hours after surgery.   If you are taking public transportation, you will need to have a responsible individual with you.  Please call the Pre-admissions Testing Dept. at [phone] if you have any questions about these instructions.  Surgery Visitation Policy:  Patients having surgery or a procedure may have two visitors.  Children under the age of 53 must have an adult with them who is not the patient.  Inpatient Visitation:    Visiting hours are 7 a.m. to 8 p.m. Up to four visitors are allowed at one time in a patient room. The visitors may rotate out with other people during the day.  One visitor age 39 or older may stay with the patient overnight and must be in the room by 8 p.m.    Preparing for Surgery with CHLORHEXIDINE  GLUCONATE (CHG) Soap  Chlorhexidine  Gluconate (CHG) Soap  o An antiseptic cleaner that kills germs and bonds with the skin to continue killing germs even after washing  o Used for showering the night before surgery and morning of surgery  Before surgery, you can play an important role by reducing the number of germs on your skin.  CHG (Chlorhexidine  gluconate) soap is an antiseptic cleanser which kills germs and bonds with the skin to continue killing germs even after washing.  Please do not use if you have an allergy to CHG or antibacterial soaps. If your skin becomes reddened/irritated stop using the CHG.  1. Shower the NIGHT BEFORE SURGERY and the MORNING OF SURGERY with CHG soap.  2. If you choose to wash your hair, wash your hair first as usual with your normal shampoo.  3. After shampooing, rinse your hair and body  thoroughly to remove the shampoo.  4. Use CHG as you would any other liquid soap. You can apply CHG directly to the skin and wash gently with a scrungie or a clean washcloth.  5. Apply the CHG soap to your body only from the neck down. Do not use on open wounds or open sores. Avoid contact with your eyes, ears, mouth, and genitals (private parts). Wash face and genitals (private parts) with your normal soap.  6. Wash thoroughly, paying special attention to the area where your surgery will be performed.  7. Thoroughly rinse your body with warm water.  8. Do not shower/wash with your normal soap after using and rinsing off the CHG soap.  9. Pat yourself dry with a clean towel.  10. Wear clean pajamas to bed the night before surgery.  12. Place clean sheets on your bed the night of your first shower and do not sleep with pets.  13. Shower again with the CHG soap on the day of surgery prior  to arriving at the hospital.  14. Do not apply any deodorants/lotions/powders.  15. Please wear clean clothes to the hospital.

## 2023-10-16 ENCOUNTER — Encounter
Admission: RE | Admit: 2023-10-16 | Discharge: 2023-10-16 | Disposition: A | Discharge status: Home / Self Care | Source: Ambulatory Visit | Attending: Physician Assistant | Admitting: Physician Assistant

## 2023-10-16 DIAGNOSIS — R072 Precordial pain: Secondary | ICD-10-CM | POA: Insufficient documentation

## 2023-10-16 LAB — NM MYOCAR MULTI W/SPECT W/WALL MOTION / EF
Estimated workload: 1
Exercise duration (min): 0 min
Exercise duration (sec): 0 s
LV dias vol: 58 mL (ref 46–106)
LV sys vol: 16 mL
MPHR: 167 {beats}/min
Nuc Stress EF: 72 %
Peak HR: 117 {beats}/min
Percent HR: 70 %
Rest HR: 65 {beats}/min
Rest Nuclear Isotope Dose: 10.5 mCi
SDS: 1
SRS: 0
SSS: 0
ST Depression (mm): 0 mm
Stress Nuclear Isotope Dose: 32.6 mCi
TID: 1.38

## 2023-10-16 MED ORDER — REGADENOSON 0.4 MG/5ML IV SOLN
0.4000 mg | Freq: Once | INTRAVENOUS | Status: AC
Start: 2023-10-16 — End: 2023-10-16
  Administered 2023-10-16: 0.4 mg via INTRAVENOUS

## 2023-10-16 MED ORDER — TECHNETIUM TC 99M TETROFOSMIN IV KIT
10.0000 | PACK | Freq: Once | INTRAVENOUS | Status: AC | PRN
  Administered 2023-10-16: 10.45 via INTRAVENOUS

## 2023-10-16 MED ORDER — TECHNETIUM TC 99M TETROFOSMIN IV KIT
32.5800 | PACK | Freq: Once | INTRAVENOUS | Status: AC | PRN
Start: 2023-10-16 — End: 2023-10-16
  Administered 2023-10-16: 32.58 via INTRAVENOUS

## 2023-10-16 NOTE — Telephone Encounter (Signed)
 Stress test on 10/16/2023 showed no evidence of ischemia or infarction and was overall low risk.  LV systolic function normal.  No further cardiac testing is indicated prior to cholecystectomy and she may proceed with noncardiac surgery with an overall risk of 6% for adverse cardiac event in the periprocedural timeframe.  No further cardiac testing will mitigate this risk.

## 2023-10-16 NOTE — Telephone Encounter (Signed)
   Name: Janet Murray  DOB: 07-09-1970  MRN: [id number]   Primary Cardiologist: Constancia Delton, MD  Chart reviewed as part of pre-operative protocol coverage. Janet Murray was last seen on 09/30/2023 by Janet Gentleman, PA-C.  She was having chest pain and a stress test was ordered. Per Verdie Gladden "Stress test on 10/16/2023 showed no evidence of ischemia or infarction and was overall low risk. LV systolic function normal. No further cardiac testing is indicated prior to cholecystectomy and she may proceed with noncardiac surgery with an overall risk of 6% for adverse cardiac event in the periprocedural timeframe. No further cardiac testing will mitigate this risk."  I will route this recommendation to the requesting party via Epic fax function and remove from pre-op pool. Please call with questions.  Morey Ar, NP 10/16/2023, 5:06 PM

## 2023-10-19 ENCOUNTER — Ambulatory Visit (INDEPENDENT_AMBULATORY_CARE_PROVIDER_SITE_OTHER): Admitting: Nurse Practitioner

## 2023-10-19 DIAGNOSIS — Z538 Procedure and treatment not carried out for other reasons: Secondary | ICD-10-CM

## 2023-10-19 NOTE — Progress Notes (Signed)
 Patient did not need to be seen.  Has surgery scheduled for 5/15.

## 2023-10-19 NOTE — Progress Notes (Deleted)
 There were no vitals taken for this visit.   Subjective:    Patient ID: Janet Murray, female    DOB: 1971-04-22, 53 y.o.   MRN: [id number]  HPI: Janet Murray is a 53 y.o. female  No chief complaint on file.  Patient states her symptoms started about 3 weeks ago.  Reports it feels like her heart is "rolling" over.  Inside of her body gets really hot and then she starts sweating.  Feels like her chest is going "90 miles per hour".  She feels like she is having chest pain and can't breath.  Feels like it might be a panic attack.  States the symptoms aren't all the time but they come and go.    She stopped taking Mounjaro about a week ago but symptoms having improved.    Patient is also having RUQ abdominal pain.  She states she is having nausea.  Denies diarrhea and constipation.  Her appetite has been normal.  She is having gas pains and when she presses on her abdomen it is painful.    Relevant past medical, surgical, family and social history reviewed and updated as indicated. Interim medical history since our last visit reviewed. Allergies and medications reviewed and updated.  Review of Systems  Cardiovascular:  Positive for chest pain and palpitations.  Gastrointestinal:  Positive for abdominal pain and nausea. Negative for constipation, diarrhea and vomiting.    Per HPI unless specifically indicated above     Objective:    There were no vitals taken for this visit.  Wt Readings from Last 3 Encounters:  10/07/23 184 lb 3.2 oz (83.6 kg)  09/30/23 183 lb (83 kg)  09/15/23 184 lb 6.4 oz (83.6 kg)    Physical Exam Vitals and nursing note reviewed.  Constitutional:      General: She is not in acute distress.    Appearance: Normal appearance. She is normal weight. She is not ill-appearing, toxic-appearing or diaphoretic.  HENT:     Head: Normocephalic.     Right Ear: External ear normal.     Left Ear: External ear normal.     Nose: Nose normal.     Mouth/Throat:      Mouth: Mucous membranes are moist.     Pharynx: Oropharynx is clear.  Eyes:     General:        Right eye: No discharge.        Left eye: No discharge.     Extraocular Movements: Extraocular movements intact.     Conjunctiva/sclera: Conjunctivae normal.     Pupils: Pupils are equal, round, and reactive to light.  Cardiovascular:     Rate and Rhythm: Normal rate and regular rhythm.     Heart sounds: No murmur heard. Pulmonary:     Effort: Pulmonary effort is normal. No respiratory distress.     Breath sounds: Normal breath sounds. No wheezing or rales.  Abdominal:     Tenderness: There is abdominal tenderness in the right upper quadrant. Positive signs include Murphy's sign.  Musculoskeletal:     Cervical back: Normal range of motion and neck supple.  Skin:    General: Skin is warm and dry.     Capillary Refill: Capillary refill takes less than 2 seconds.  Neurological:     General: No focal deficit present.     Mental Status: She is alert and oriented to person, place, and time. Mental status is at baseline.  Psychiatric:  Mood and Affect: Mood normal.        Behavior: Behavior normal.        Thought Content: Thought content normal.        Judgment: Judgment normal.    Results for orders placed or performed during the hospital encounter of 10/16/23  NM Myocar Multi W/Spect W/Wall Motion / EF   Collection Time: 10/16/23  1:08 PM  Result Value Ref Range   Rest HR 65.0 bpm   Rest BP 102/55 mmHg   Exercise duration (min) 0 min   Exercise duration (sec) 0 sec   Estimated workload 1.0    Peak HR 117 bpm   Peak BP 137/59 mmHg   MPHR 167 bpm   Percent HR 70.0 %   ST Depression (mm) 0 mm   Rest Nuclear Isotope Dose 10.5 mCi   Stress Nuclear Isotope Dose 32.6 mCi   SSS 0.0    SRS 0.0    SDS 1.0    TID 1.38    LV sys vol 16.0 mL   LV dias vol 58.0 46 - 106 mL   Nuc Stress EF 72 %      Assessment & Plan:   Problem List Items Addressed This Visit   None      Follow up plan: No follow-ups on file.

## 2023-10-20 ENCOUNTER — Encounter: Payer: Self-pay | Admitting: Urgent Care

## 2023-10-20 NOTE — Progress Notes (Signed)
 Perioperative / Anesthesia Services  Pre-Admission Testing Clinical Review / Pre-Operative Anesthesia Consult  Date: 10/20/23  PATIENT DEMOGRAPHICS: Name: Janet Murray DOB: [birth year] MRN:   [id number]  Note: Available PAT nursing documentation and vital signs have been reviewed. Clinical nursing staff has updated patient's PMH/PSHx, current medication list, and drug allergies/intolerances to ensure complete and comprehensive history available to assist care teams in MDM as it pertains to the aforementioned surgical procedure and anticipated anesthetic course. Extensive review of available clinical information personally performed. [place] [facility] and PSHx updated with any diagnoses/procedures that  may have been inadvertently omitted during his intake with the pre-admission testing department's nursing staff.  PLANNED SURGICAL PROCEDURE(S):    Case: [id number] Date/Time: 10/22/23 1118   Procedure: CHOLECYSTECTOMY, ROBOT-ASSISTED, LAPAROSCOPIC (Abdomen) - ICG   Anesthesia type: General   Diagnosis: Calculus of gallbladder without cholecystitis without obstruction [K80.20]   Pre-op diagnosis: Calculus of gallbladder without cholecystitis without obstruction   Location: [facility] OR ROOM 06 / [facility]   Surgeons: Alben Alma, MD     CLINICAL DISCUSSION: Available PAT nursing documentation and vital signs have been reviewed. Clinical nursing staff has updated patient's PMH/PSHx, current medication list, and drug allergies/intolerances to ensure complete and comprehensive history available to assist care teams in MDM as it pertains to the aforementioned surgical procedure and anticipated anesthetic course. Extensive review of available clinical information personally performed. [place] PMH and PSHx updated with any diagnoses/procedures that  may have been inadvertently omitted during his intake with the pre-admission testing department's nursing staff.  Janet Murray is a  53 y.o. female who is submitted for pre-surgical anesthesia review and clearance prior to her undergoing the above procedure. Patient is a Current Smoker (12.5 pack years). Pertinent PMH includes: CAD, angina, IRBBB, HLD, iatrogenic hypothyroidism, COPD, asthma. GERD (on daily PPI), dysphagia, cholelithiasis, remote gastric and thyroid  cancer, OA, lumbar DDD, RLS, peripheral neuropathy, chronic pain (on COT), anxiety.   Patient is followed by cardiology Junnie Olives, MD). She was last seen in the cardiology clinic on 09/30/2023; notes reviewed. At the time of her clinic visit, patient complaining of a 56-month history of tachypalpitations associated and chest discomfort.  Patient has chronic shortness of breath in the setting of known COPD diagnosis and ongoing smoking.  Respiratory status reported to be stable and at baseline. Patient denied any PND, orthopnea, palpitations, significant peripheral edema, weakness, fatigue, vertiginous symptoms, or presyncope/syncope. Patient with a past medical history significant for cardiovascular diagnoses. Documented physical exam was grossly benign, providing no evidence of acute exacerbation and/or decompensation of the patient's known cardiovascular conditions.  Most recent TTE performed on 12/15/2022 revealed a normal left ventricular systolic function with an EF of 55-60%. There were no regional wall motion abnormalities.  Left ventricular diastolic Doppler parameters were normal.  GLS -18.5%. Right ventricular size and function normal with a TAPSE measuring 2.0 cm  (normal range >/= 1.6 cm). There was no valve regurgitation. All transvalvular gradients were noted to be normal providing no evidence suggestive of valvular stenosis. Aorta normal in size with no evidence of ectasia or aneurysmal dilatation.  Coronary CTA was performed on 01/14/2023 that demonstrated an Agatston coronary artery calcium  score of 13.7. This placed patient in the 69 percentile for age, sex,  and race matched controls. Calcium  depositions noted to be isolated mainly in the mid LAD (<25%) distribution. Study demonstrates normal coronary origin with RIGHT dominance.  Blood pressure well controlled at 116/62 mmHg. Patient is on atorvastatin  +  ezetimibe  for her HLD diagnosis and ASCVD prevention. Patient is not diabetic. She does not have an OSAH diagnosis. Patient is able to complete all of her  ADL/IADLs without cardiovascular limitation.  Per the DASI, patient is able to achieve at least 4 METS of physical activity without experiencing any significant degree of angina/anginal equivalent symptoms.  Given patient's history of known coronary artery calcifications, tachypalpitations, and ongoing smoking history, the decision was made to further evaluate patient's cardiovascular risk for high risk ischemia via myocardial perfusion imaging. No changes were made to her medication regimen during her visit with cardiology.  Patient scheduled to follow-up with outpatient cardiology in 2 months or sooner if needed.  Since patient was last seen by cardiology, she has undergone the recommended noninvasive cardiovascular testing.  Most recent myocardial perfusion imaging study was performed on 10/16/2023 revealing a normal left ventricular systolic function with a hyperdynamic EF of 72%.  There were no regional wall motion abnormalities.  No artifact or left ventricular cavity size enlargement appreciated on review of imaging. SPECT images demonstrated no evidence of stress-induced myocardial ischemia or arrhythmia; no scintigraphic evidence of scar.  TID ratio = 1.38. Study determined to be normal and low risk.  Janet Murray is scheduled for an elective CHOLECYSTECTOMY, ROBOT-ASSISTED, LAPAROSCOPIC (Abdomen) on 10/22/2023 with Dr. Evelia Hipp, MD.  Given patient's past medical history significant for cardiovascular diagnoses, presurgical cardiac clearance was sought by the PAT team. Per cardiology, "stress  test on 10/16/2023 showed no evidence of ischemia or infarction and was overall low risk. LV systolic function normal. No further cardiac testing is indicated prior to cholecystectomy and she may proceed with noncardiac surgery with an overall risk of 6% for adverse cardiac event in the periprocedural timeframe. No further cardiac testing will mitigate this risk".  In review of her medication reconciliation, the patient is not noted to be taking any type of anticoagulation or antiplatelet therapies that would need to be held during her perioperative course.  Patient denies previous perioperative complications with anesthesia in the past. In review her EMR, it is noted that patient underwent a general anesthetic course here at [facility] ([facility]) in 10/2021 without documented complications.   MOST RECENT VITAL SIGNS:    10/07/2023    1:24 PM 09/30/2023    1:19 PM 09/15/2023    2:37 PM  Vitals with BMI  Height 5\' 1"  5\' 1"  5' 0.984"  Weight 184 lbs 3 oz 183 lbs 184 lbs 6 oz  BMI 34.82 34.6 34.86  Systolic 116 116 213  Diastolic 70 62 69  Pulse 72 85 71   PROVIDERS/SPECIALISTS: NOTE: Primary physician provider listed below. Patient may have been seen by APP or partner within same practice.   PROVIDER ROLE / SPECIALTY LAST Cyndee Dragon, MD General Surgery (Surgeon) 10/07/2023  Aileen Alexanders, NP Primary Care Provider 09/15/2023  Constancia Delton, MD Cardiology 09/30/2023; preop APP call 10/16/2023   ALLERGIES: Allergies  Allergen Reactions   Penicillins Hives and Swelling    Has tolerated multiple 1st & 3rd generation (CEFAZOLIN , CEPHALEXIN , CEFTRIAXONE , CEFDINIR ) cephalosporins in the past with no documented ADRs.    Acetaminophen      Due to polyps    Ciprofloxacin  Itching   Aspirin Palpitations   Hydrocodone  Hives and Rash   Tape Rash    Per patient "clear tape"   CURRENT HOME MEDICATIONS: No current facility-administered medications for  this encounter.    albuterol  (PROVENTIL ) (2.5 MG/3ML) 0.083%  nebulizer solution   albuterol  (VENTOLIN  HFA) 108 (90 Base) MCG/ACT inhaler   atorvastatin  (LIPITOR) 80 MG tablet   Cholecalciferol (VITAMIN D3) 125 MCG (5000 UT) CAPS   diclofenac Sodium (VOLTAREN) 1 % GEL   esomeprazole  (NEXIUM ) 40 MG capsule   ezetimibe  (ZETIA ) 10 MG tablet   hydrOXYzine  (VISTARIL ) 25 MG capsule   levothyroxine  (SYNTHROID ) 175 MCG tablet   mirabegron  ER (MYRBETRIQ ) 50 MG TB24 tablet   montelukast  (SINGULAIR ) 10 MG tablet   naloxone (NARCAN) nasal spray 4 mg/0.1 mL   ondansetron  (ZOFRAN -ODT) 4 MG disintegrating tablet   oxyCODONE  10 MG TABS   pramipexole  (MIRAPEX ) 0.5 MG tablet   tirzepatide (MOUNJARO) 5 MG/0.5ML Pen   tiZANidine  (ZANAFLEX ) 2 MG tablet   AMBULATORY NON FORMULARY MEDICATION   fluticasone  (FLONASE ) 50 MCG/ACT nasal spray   Glycopyrrolate -Formoterol  (BEVESPI  AEROSPHERE) 9-4.8 MCG/ACT AERO   HISTORY: Past Medical History:  Diagnosis Date   Anginal pain (HCC)    Anxiety    Arthritis    Asthma    Biliary colic    Carpal tunnel syndrome of right wrist    Cholelithiasis    Chronic pain    Colon cancer (HCC)    COPD (chronic obstructive pulmonary disease) (HCC)    Coronary artery disease    Cyst of right kidney 03/22/2018   DDD (degenerative disc disease), lumbar    Degenerative joint disease (DJD) of hip    Diarrhea of infectious origin 03/25/2018   a.) culture (+) for campylobacter species   Dysphagia    Dyspnea    Emphysema of lung (HCC)    Facet syndrome, lumbar 10/25/2014   Fatty liver 03/25/2018   Follicular thyroid  carcinoma (HCC)    a.) stage I --> s/p total thyroidectomy   Foot fracture, left 05/02/2015   Ganglion cyst of dorsum of right wrist    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   GERD (gastroesophageal reflux disease)    Hereditary and idiopathic peripheral neuropathy 11/06/2014   HLD (hyperlipidemia)    Iatrogenic hypothyroidism    a.) s/p radioactive idodine and  subsequent total thyroidectomy for stage I follicular thyroid  carcinoma   IBS (irritable bowel syndrome)    Incomplete RBBB    Long term prescription opiate use    a.) managed by PCP; has naloxone Rx available   Low back pain    Migraine 02/04/2016   Occipital neuralgia    Osteoarthritis of spine with radiculopathy, lumbar region 10/25/2014   RLS (restless legs syndrome)    a.) takes pramipexole    Sacroiliac joint dysfunction of both sides 10/25/2014   Spondylosis of lumbar spine    Stomach cancer (HCC)    Tobacco use    Tubular adenoma of colon    Urinary incontinence    Past Surgical History:  Procedure Laterality Date   ABDOMINAL HYSTERECTOMY     APPENDECTOMY     BLADDER SURGERY     sling - Dr. Monda Angry    CESAREAN SECTION     COLONOSCOPY WITH PROPOFOL  N/A 04/11/2020   Procedure: COLONOSCOPY WITH PROPOFOL ;  Surgeon: Irby Mannan, MD;  Location: [facility] ENDOSCOPY;  Service: Endoscopy;  Laterality: N/A;   COLONOSCOPY WITH PROPOFOL  N/A 10/22/2021   Procedure: COLONOSCOPY WITH PROPOFOL ;  Surgeon: Selena Daily, MD;  Location: [facility] ENDOSCOPY;  Service: Gastroenterology;  Laterality: N/A;   ESOPHAGOGASTRODUODENOSCOPY  04/26/2019   STOMACH SURGERY     THYROIDECTOMY N/A 04/12/2021   Procedure: THYROIDECTOMY, total;  Surgeon: Alben Alma, MD;  Location: [facility] ORS;  Service: General;  Laterality:  N/A;   TOTAL ABDOMINAL HYSTERECTOMY W/ BILATERAL SALPINGOOPHORECTOMY     Family History  Problem Relation Age of Onset   Neuropathy Mother    Atrial fibrillation Mother    Arthritis Father    Hypertension Father    Leukemia Father    Heart disease Father        CAD s/p CABG   Healthy Brother    Healthy Brother    Healthy Brother    Dementia Maternal Grandmother    Hypertension Maternal Grandmother    Hypercholesterolemia Maternal Grandmother    Heart disease Maternal Grandmother    Healthy Daughter    Healthy Son    Stroke Neg Hx    Cancer Neg Hx    Breast cancer  Neg Hx    Social History   Tobacco Use   Smoking status: Every Day    Current packs/day: 0.50    Average packs/day: 0.5 packs/day for 25.0 years (12.5 ttl pk-yrs)    Types: Cigarettes   Smokeless tobacco: Never   Tobacco comments:    0.5PPD 11/11/2022 khj  Substance Use Topics   Alcohol use: Not Currently    Comment: occassionally   LABS:  Lab Results  Component Value Date   WBC 6.4 09/13/2023   HGB 13.9 09/13/2023   HCT 40.5 09/13/2023   MCV 98.3 09/13/2023   PLT 100 (L) 09/13/2023   Lab Results  Component Value Date   NA 141 09/13/2023   CL 103 09/13/2023   K 4.0 09/13/2023   CO2 29 09/13/2023   BUN 9 09/13/2023   CREATININE 0.62 09/13/2023   GFRNONAA >60 09/13/2023   CALCIUM  9.6 09/13/2023   ALBUMIN 4.2 07/15/2023   GLUCOSE 116 (H) 09/13/2023   Lab Results  Component Value Date   HGBA1C 5.4 01/12/2023    ECG: Date: 09/13/2023  Time ECG obtained: 0156 AM Rate: 73 bpm Rhythm: NSR; IRBBB Axis (leads I and aVF): normal Intervals: PR 176 ms. QRS 94 ms. QTc 423 ms. ST segment and T wave changes: No evidence of acute T wave abnormalities or significant ST segment elevation or depression.  Evidence of a possible, age undetermined, prior infarct:  No Comparison: Similar to previous tracing obtained on 10/16/2022   IMAGING / PROCEDURES: MYOCARDIAL PERFUSION IMAGING STUDY (LEXISCAN) performed on 10/16/2023 Normal left ventricular systolic function with a hyperdynamic LVEF of 73% Normal myocardial thickening and wall motion Left ventricular cavity size normal SPECT images demonstrate homogenous tracer distribution throughout the myocardium No evidence of stress-induced myocardial ischemia or arrhythmia Normal low risk study  US  ABDOMEN LIMITED RUQ (LIVER/GB) performed on 10/02/2023 Cholelithiasis without sonographic evidence of acute cholecystitis. 3 mm gallbladder polyp.   CT CORONARY MORPH W/CTA COR W/SCORE W/CA W/CM &/OR WO/CM performed on  01/14/2023 Coronary calcium  score of 13.7. This was 88th percentile for age and sex matched control. Normal coronary origin with right dominance. Minimal stenosis in mid LAD (<25%). CAD-RADS 1. Minimal non-obstructive CAD (0-24%). Consider preventive therapy and risk factor modification.  TRANSTHORACIC ECHOCARDIOGRAM performed on 12/15/2022 Left ventricular ejection fraction, by estimation, is 55 to 60%. Left ventricular ejection fraction by 2D MOD biplane is 56.8 %. The left ventricle has normal function. The left ventricle has no regional wall motion abnormalities. Left ventricular diastolic parameters were normal. The average left ventricular global longitudinal strain is -18.5 %. The global longitudinal strain is normal. Right ventricular systolic function is normal. The right ventricular size is normal.  The mitral valve is normal in structure. No evidence of  mitral valve regurgitation.  The aortic valve is tricuspid. Aortic valve regurgitation is not visualized.  The inferior vena cava is normal in size with greater than 50% respiratory variability, suggesting right atrial pressure of 3 mmHg.   PULMONARY FUNCTION TESTING performed on 10/02/2022    Latest Ref Rng & Units 10/02/2022    4:34 PM  PFT Results  FVC-Pre L 1.76   FVC-Predicted Pre % 55   FVC-Post L 2.17   FVC-Predicted Post % 69   Pre FEV1/FVC % % 68   Post FEV1/FCV % % 71   FEV1-Pre L 1.20   FEV1-Predicted Pre % 48   FEV1-Post L 1.54   DLCO uncorrected ml/min/mmHg 18.05   DLCO UNC% % 95   DLVA Predicted % 96   TLC L 5.65   TLC % Predicted % 122   RV % Predicted % 192    CT ANGIO CHEST AORTA W AND/OR WO CONTRAST performed on 08/22/2022 No evidence of aortic dissection or aneurysm. Minimal atelectasis in the medial right middle lobe.   CT ABDOMEN PELVIS W CONTRAST performed on 06/28/2022 Mild diffuse circumferential wall thickening and mucosal enhancement is noted involving the bladder. Urothelial enhancement is  noted involving bilateral ureters without hydroureter or ureteral lithiasis. Mild mucosal enhancement is noted involving the mid and distal left ureter without signs of hydroureter. Findings are compatible with cystitis and ureteritis. No signs of hydronephrosis or nephrolithiasis.   IMPRESSION AND PLAN: Janet Murray has been referred for pre-anesthesia review and clearance prior to her undergoing the planned anesthetic and procedural courses. Available labs, pertinent testing, and imaging results were personally reviewed by me in preparation for upcoming operative/procedural course. [facility] medical record has been updated following extensive record review and patient interview with PAT staff.   This patient has been appropriately cleared by cardiology with an overall ACCEPTABLE risk of patient experiencing significant perioperative cardiovascular complications. Based on clinical review performed today (10/20/23), barring any significant acute changes in the patient's overall condition, it is anticipated that she will be able to proceed with the planned surgical intervention. Any acute changes in clinical condition may necessitate her procedure being postponed and/or cancelled. Patient will meet with anesthesia team (MD and/or CRNA) on the day of her procedure for preoperative evaluation/assessment. Questions regarding anesthetic course will be fielded at that time.   Pre-surgical instructions were reviewed with the patient during his PAT appointment, and questions were fielded to satisfaction by PAT clinical staff. She has been instructed on which medications that she will need to hold prior to surgery, as well as the ones that have been deemed safe/appropriate to take on the day of her procedure. As part of the general education provided by PAT, patient made aware both verbally and in writing, that she would need to abstain from the use of any illegal substances during her perioperative course. She  was advised that failure to follow the provided instructions could necessitate case cancellation or result in serious perioperative complications up to and including death. Patient encouraged to contact PAT and/or her surgeon's office to discuss any questions or concerns that may arise prior to surgery; verbalized understanding.   Renate Caroline, MSN, APRN, FNP-C, CEN [facility]  Perioperative Services Nurse Practitioner Phone: [phone] Fax: [phone] 10/20/23 2:36 PM  NOTE: This note has been prepared using Dragon dictation software. Despite my best ability to proofread, there is always the potential that unintentional transcriptional errors may still occur from this process.

## 2023-10-22 ENCOUNTER — Other Ambulatory Visit: Payer: Self-pay

## 2023-10-22 ENCOUNTER — Ambulatory Visit: Admitting: Urgent Care

## 2023-10-22 ENCOUNTER — Encounter
Admission: RE | Disposition: A | Discharge status: Home / Self Care | Payer: Self-pay | Source: Home / Self Care | Attending: Surgery

## 2023-10-22 ENCOUNTER — Ambulatory Visit
Admission: RE | Admit: 2023-10-22 | Discharge: 2023-10-22 | Disposition: A | Discharge status: Home / Self Care | Attending: Surgery | Admitting: Surgery

## 2023-10-22 ENCOUNTER — Encounter: Payer: Self-pay | Admitting: Surgery

## 2023-10-22 DIAGNOSIS — K219 Gastro-esophageal reflux disease without esophagitis: Secondary | ICD-10-CM | POA: Diagnosis not present

## 2023-10-22 DIAGNOSIS — Z79899 Other long term (current) drug therapy: Secondary | ICD-10-CM | POA: Insufficient documentation

## 2023-10-22 DIAGNOSIS — K8020 Calculus of gallbladder without cholecystitis without obstruction: Secondary | ICD-10-CM

## 2023-10-22 DIAGNOSIS — K8010 Calculus of gallbladder with chronic cholecystitis without obstruction: Secondary | ICD-10-CM | POA: Diagnosis not present

## 2023-10-22 DIAGNOSIS — E032 Hypothyroidism due to medicaments and other exogenous substances: Secondary | ICD-10-CM | POA: Diagnosis not present

## 2023-10-22 DIAGNOSIS — E785 Hyperlipidemia, unspecified: Secondary | ICD-10-CM | POA: Insufficient documentation

## 2023-10-22 DIAGNOSIS — M1990 Unspecified osteoarthritis, unspecified site: Secondary | ICD-10-CM | POA: Diagnosis not present

## 2023-10-22 DIAGNOSIS — J4489 Other specified chronic obstructive pulmonary disease: Secondary | ICD-10-CM | POA: Diagnosis not present

## 2023-10-22 DIAGNOSIS — F17210 Nicotine dependence, cigarettes, uncomplicated: Secondary | ICD-10-CM | POA: Insufficient documentation

## 2023-10-22 DIAGNOSIS — I2510 Atherosclerotic heart disease of native coronary artery without angina pectoris: Secondary | ICD-10-CM | POA: Diagnosis not present

## 2023-10-22 DIAGNOSIS — R1310 Dysphagia, unspecified: Secondary | ICD-10-CM | POA: Diagnosis not present

## 2023-10-22 DIAGNOSIS — Z90710 Acquired absence of both cervix and uterus: Secondary | ICD-10-CM | POA: Diagnosis not present

## 2023-10-22 DIAGNOSIS — K811 Chronic cholecystitis: Secondary | ICD-10-CM | POA: Diagnosis not present

## 2023-10-22 DIAGNOSIS — Z9049 Acquired absence of other specified parts of digestive tract: Secondary | ICD-10-CM | POA: Insufficient documentation

## 2023-10-22 HISTORY — DX: Hyperlipidemia, unspecified: E78.5

## 2023-10-22 HISTORY — DX: Benign neoplasm of colon, unspecified: D12.6

## 2023-10-22 HISTORY — DX: Long term (current) use of opiate analgesic: Z79.891

## 2023-10-22 HISTORY — DX: Malignant neoplasm of thyroid gland: C73

## 2023-10-22 HISTORY — DX: Calculus of gallbladder without cholecystitis without obstruction: K80.20

## 2023-10-22 HISTORY — DX: Tobacco use: Z72.0

## 2023-10-22 HISTORY — DX: Hypothyroidism due to medicaments and other exogenous substances: E03.2

## 2023-10-22 HISTORY — DX: Unspecified right bundle-branch block: I45.10

## 2023-10-22 SURGERY — CHOLECYSTECTOMY, ROBOT-ASSISTED, LAPAROSCOPIC
Anesthesia: General | Site: Abdomen

## 2023-10-22 MED ORDER — FENTANYL CITRATE (PF) 100 MCG/2ML IJ SOLN
INTRAMUSCULAR | Status: DC | PRN
Start: 2023-10-22 — End: 2023-10-22
  Administered 2023-10-22 (×4): 50 ug via INTRAVENOUS

## 2023-10-22 MED ORDER — KETOROLAC TROMETHAMINE 30 MG/ML IJ SOLN
INTRAMUSCULAR | Status: DC | PRN
Start: 2023-10-22 — End: 2023-10-22
  Administered 2023-10-22: 30 mg via INTRAVENOUS

## 2023-10-22 MED ORDER — OXYCODONE HCL 5 MG PO TABS
ORAL_TABLET | ORAL | Status: AC
Start: 2023-10-22 — End: ?
  Filled 2023-10-22: qty 1

## 2023-10-22 MED ORDER — EPHEDRINE 5 MG/ML INJ
INTRAVENOUS | Status: AC
  Filled 2023-10-22: qty 5

## 2023-10-22 MED ORDER — DEXMEDETOMIDINE HCL IN NACL 80 MCG/20ML IV SOLN
INTRAVENOUS | Status: DC | PRN
  Administered 2023-10-22: 4 ug via INTRAVENOUS
  Administered 2023-10-22: 12 ug via INTRAVENOUS
  Administered 2023-10-22 (×2): 4 ug via INTRAVENOUS

## 2023-10-22 MED ORDER — CEFAZOLIN SODIUM-DEXTROSE 2-4 GM/100ML-% IV SOLN
INTRAVENOUS | Status: AC
  Filled 2023-10-22: qty 100

## 2023-10-22 MED ORDER — ORAL CARE MOUTH RINSE: 15.0000 mL | Freq: Once | OROMUCOSAL | Status: AC

## 2023-10-22 MED ORDER — GABAPENTIN 300 MG PO CAPS
300.0000 mg | ORAL_CAPSULE | ORAL | Status: AC
  Administered 2023-10-22: 300 mg via ORAL

## 2023-10-22 MED ORDER — KETAMINE HCL 50 MG/5ML IJ SOSY
PREFILLED_SYRINGE | INTRAMUSCULAR | Status: DC | PRN
  Administered 2023-10-22: 20 mg via INTRAVENOUS
  Administered 2023-10-22: 10 mg via INTRAVENOUS

## 2023-10-22 MED ORDER — INDOCYANINE GREEN 25 MG IV SOLR
1.2500 mg | Freq: Once | INTRAVENOUS | Status: AC
Start: 2023-10-22 — End: 2023-10-22
  Administered 2023-10-22: 1.25 mg via INTRAVENOUS

## 2023-10-22 MED ORDER — FENTANYL CITRATE (PF) 100 MCG/2ML IJ SOLN
INTRAMUSCULAR | Status: AC
  Filled 2023-10-22: qty 2

## 2023-10-22 MED ORDER — ROCURONIUM BROMIDE 100 MG/10ML IV SOLN
INTRAVENOUS | Status: DC | PRN
  Administered 2023-10-22: 50 mg via INTRAVENOUS

## 2023-10-22 MED ORDER — MIDAZOLAM HCL 2 MG/2ML IJ SOLN
INTRAMUSCULAR | Status: AC
  Filled 2023-10-22: qty 2

## 2023-10-22 MED ORDER — KETAMINE HCL 50 MG/5ML IJ SOSY
PREFILLED_SYRINGE | INTRAMUSCULAR | Status: AC
  Filled 2023-10-22: qty 5

## 2023-10-22 MED ORDER — DROPERIDOL 2.5 MG/ML IJ SOLN
INTRAMUSCULAR | Status: AC
  Filled 2023-10-22: qty 2

## 2023-10-22 MED ORDER — LACTATED RINGERS IV SOLN: INTRAVENOUS | Status: DC

## 2023-10-22 MED ORDER — MIDAZOLAM HCL 2 MG/2ML IJ SOLN
INTRAMUSCULAR | Status: DC | PRN
  Administered 2023-10-22: 2 mg via INTRAVENOUS

## 2023-10-22 MED ORDER — CHLORHEXIDINE GLUCONATE CLOTH 2 % EX PADS
6.0000 | MEDICATED_PAD | Freq: Once | CUTANEOUS | Status: AC
  Administered 2023-10-22: 6 via TOPICAL

## 2023-10-22 MED ORDER — DROPERIDOL 2.5 MG/ML IJ SOLN
0.6250 mg | Freq: Once | INTRAMUSCULAR | Status: AC | PRN
  Administered 2023-10-22: 0.625 mg via INTRAVENOUS

## 2023-10-22 MED ORDER — BUPIVACAINE-EPINEPHRINE (PF) 0.25% -1:200000 IJ SOLN
INTRAMUSCULAR | Status: AC
  Filled 2023-10-22: qty 30

## 2023-10-22 MED ORDER — GABAPENTIN 300 MG PO CAPS
ORAL_CAPSULE | ORAL | Status: AC
  Filled 2023-10-22: qty 1

## 2023-10-22 MED ORDER — CHLORHEXIDINE GLUCONATE 0.12 % MT SOLN
OROMUCOSAL | Status: AC
  Filled 2023-10-22: qty 15

## 2023-10-22 MED ORDER — SUGAMMADEX SODIUM 200 MG/2ML IV SOLN
INTRAVENOUS | Status: DC | PRN
  Administered 2023-10-22: 200 mg via INTRAVENOUS

## 2023-10-22 MED ORDER — FENTANYL CITRATE (PF) 100 MCG/2ML IJ SOLN
25.0000 ug | INTRAMUSCULAR | Status: DC | PRN
  Administered 2023-10-22: 50 ug via INTRAVENOUS

## 2023-10-22 MED ORDER — LIDOCAINE HCL (CARDIAC) PF 100 MG/5ML IV SOSY
PREFILLED_SYRINGE | INTRAVENOUS | Status: DC | PRN
  Administered 2023-10-22: 60 mg via INTRAVENOUS

## 2023-10-22 MED ORDER — BUPIVACAINE-EPINEPHRINE (PF) 0.25% -1:200000 IJ SOLN
INTRAMUSCULAR | Status: DC | PRN
  Administered 2023-10-22: 30 mL

## 2023-10-22 MED ORDER — CHLORHEXIDINE GLUCONATE CLOTH 2 % EX PADS
6.0000 | MEDICATED_PAD | Freq: Once | CUTANEOUS | Status: DC
Start: 2023-10-22 — End: 2023-10-22

## 2023-10-22 MED ORDER — BUPIVACAINE LIPOSOME 1.3 % IJ SUSP
INTRAMUSCULAR | Status: DC | PRN
  Administered 2023-10-22: 20 mL

## 2023-10-22 MED ORDER — CHLORHEXIDINE GLUCONATE 0.12 % MT SOLN
15.0000 mL | Freq: Once | OROMUCOSAL | Status: AC
  Administered 2023-10-22: 15 mL via OROMUCOSAL

## 2023-10-22 MED ORDER — EPHEDRINE SULFATE-NACL 50-0.9 MG/10ML-% IV SOSY
PREFILLED_SYRINGE | INTRAVENOUS | Status: DC | PRN
  Administered 2023-10-22: 5 mg via INTRAVENOUS

## 2023-10-22 MED ORDER — ALBUTEROL SULFATE HFA 108 (90 BASE) MCG/ACT IN AERS
INHALATION_SPRAY | RESPIRATORY_TRACT | Status: AC
  Filled 2023-10-22: qty 6.7

## 2023-10-22 MED ORDER — OXYCODONE HCL 5 MG PO TABS: 5.0000 mg | ORAL_TABLET | ORAL | 0 refills | Status: DC | PRN

## 2023-10-22 MED ORDER — DEXAMETHASONE SODIUM PHOSPHATE 10 MG/ML IJ SOLN
INTRAMUSCULAR | Status: DC | PRN
  Administered 2023-10-22: 10 mg via INTRAVENOUS

## 2023-10-22 MED ORDER — KETOROLAC TROMETHAMINE 30 MG/ML IJ SOLN
INTRAMUSCULAR | Status: AC
  Filled 2023-10-22: qty 1

## 2023-10-22 MED ORDER — OXYCODONE HCL 5 MG PO TABS
5.0000 mg | ORAL_TABLET | Freq: Once | ORAL | Status: AC
  Administered 2023-10-22: 5 mg via ORAL

## 2023-10-22 MED ORDER — PROPOFOL 10 MG/ML IV BOLUS
INTRAVENOUS | Status: DC | PRN
  Administered 2023-10-22: 150 mg via INTRAVENOUS

## 2023-10-22 MED ORDER — CEFAZOLIN SODIUM-DEXTROSE 2-4 GM/100ML-% IV SOLN
2.0000 g | INTRAVENOUS | Status: AC
Start: 2023-10-22 — End: 2023-10-22
  Administered 2023-10-22: 2 g via INTRAVENOUS

## 2023-10-22 MED ORDER — ONDANSETRON HCL 4 MG/2ML IJ SOLN
INTRAMUSCULAR | Status: DC | PRN
  Administered 2023-10-22: 4 mg via INTRAVENOUS

## 2023-10-22 MED ORDER — BUPIVACAINE LIPOSOME 1.3 % IJ SUSP
INTRAMUSCULAR | Status: AC
  Filled 2023-10-22: qty 20

## 2023-10-22 SURGICAL SUPPLY — 37 items
CANNULA REDUCER 12-8 DVNC XI (CANNULA) ×2 IMPLANT
CATH REDDICK CHOLANGI 4FR 50CM (CATHETERS) IMPLANT
CAUTERY HOOK MNPLR 1.6 DVNC XI (INSTRUMENTS) ×2 IMPLANT
CLIP LIGATING HEMO O LOK GREEN (MISCELLANEOUS) ×2 IMPLANT
DERMABOND ADVANCED .7 DNX12 (GAUZE/BANDAGES/DRESSINGS) ×2 IMPLANT
DRAPE ARM DVNC X/XI (DISPOSABLE) ×8 IMPLANT
DRAPE COLUMN DVNC XI (DISPOSABLE) ×2 IMPLANT
ELECTRODE REM PT RTRN 9FT ADLT (ELECTROSURGICAL) ×2 IMPLANT
FORCEPS BPLR R/ABLATION 8 DVNC (INSTRUMENTS) ×2 IMPLANT
FORCEPS PROGRASP DVNC XI (FORCEP) ×2 IMPLANT
GLOVE BIO SURGEON STRL SZ7 (GLOVE) ×4 IMPLANT
GOWN STRL REUS W/ TWL LRG LVL3 (GOWN DISPOSABLE) ×8 IMPLANT
IRRIGATION STRYKERFLOW (MISCELLANEOUS) IMPLANT
IV CATH ANGIO 12GX3 LT BLUE (NEEDLE) IMPLANT
KIT PINK PAD W/HEAD ARE REST (MISCELLANEOUS) ×2 IMPLANT
KIT PINK PAD W/HEAD ARM REST (MISCELLANEOUS) ×2 IMPLANT
LABEL OR SOLS (LABEL) ×2 IMPLANT
MANIFOLD NEPTUNE II (INSTRUMENTS) ×2 IMPLANT
NDL HYPO 22X1.5 SAFETY MO (MISCELLANEOUS) ×2 IMPLANT
NEEDLE HYPO 22X1.5 SAFETY MO (MISCELLANEOUS) ×2 IMPLANT
NS IRRIG 500ML POUR BTL (IV SOLUTION) ×2 IMPLANT
OBTURATOR OPTICALSTD 8 DVNC (TROCAR) ×2 IMPLANT
PACK LAP CHOLECYSTECTOMY (MISCELLANEOUS) ×2 IMPLANT
SEAL UNIV 5-12 XI (MISCELLANEOUS) ×8 IMPLANT
SET TUBE SMOKE EVAC HIGH FLOW (TUBING) ×2 IMPLANT
SOLUTION ELECTROSURG ANTI STCK (MISCELLANEOUS) ×2 IMPLANT
SPIKE FLUID TRANSFER (MISCELLANEOUS) ×2 IMPLANT
SPONGE T-LAP 18X18 ~~LOC~~+RFID (SPONGE) ×2 IMPLANT
SPONGE T-LAP 4X18 ~~LOC~~+RFID (SPONGE) IMPLANT
STOPCOCK 3WAY MALE LL (IV SETS) IMPLANT
SUT MNCRL AB 4-0 PS2 18 (SUTURE) ×2 IMPLANT
SUT VICRYL 0 UR6 27IN ABS (SUTURE) ×4 IMPLANT
SYR 20ML LL LF (SYRINGE) IMPLANT
SYSTEM BAG RETRIEVAL 10MM (BASKET) ×2 IMPLANT
TRAP FLUID SMOKE EVACUATOR (MISCELLANEOUS) ×2 IMPLANT
WATER STERILE IRR 3000ML UROMA (IV SOLUTION) IMPLANT
WATER STERILE IRR 500ML POUR (IV SOLUTION) ×2 IMPLANT

## 2023-10-22 NOTE — Transfer of Care (Signed)
 Immediate Anesthesia Transfer of Care Note  Patient: Janet Murray  Procedure(s) Performed: CHOLECYSTECTOMY, ROBOT-ASSISTED, LAPAROSCOPIC (Abdomen) INDOCYANINE GREEN FLUORESCENCE IMAGING (ICG)  Patient Location: PACU  Anesthesia Type:General  Level of Consciousness: drowsy and patient cooperative  Airway & Oxygen Therapy: Patient Spontanous Breathing and Patient connected to face mask oxygen  Post-op Assessment: Report given to RN, Post -op Vital signs reviewed and stable, and Patient moving all extremities X 4  Post vital signs: Reviewed and stable  Last Vitals:  Vitals Value Taken Time  BP 126/74 10/22/23 1315  Temp 36.5 C 10/22/23 1235  Pulse 69 10/22/23 1324  Resp 19 10/22/23 1324  SpO2 88 % 10/22/23 1324  Vitals shown include unfiled device data.  Last Pain:  Vitals:   10/22/23 1315  TempSrc:   PainSc: 4          Complications: No notable events documented.

## 2023-10-22 NOTE — Op Note (Signed)
 Robotic assisted laparoscopic Cholecystectomy  Pre-operative Diagnosis: Chronic cholecystitis   Post-operative Diagnosis: same  Procedure:  Robotic assisted laparoscopic Cholecystectomy  Surgeon: Evelia Hipp, MD FACS  Anesthesia: Gen. with endotracheal tube  Findings: Chronic Cholecystitis   Estimated Blood Loss: 5cc       Specimens: Gallbladder           Complications: none   Procedure Details  The patient was seen again in the Holding Room. The benefits, complications, treatment options, and expected outcomes were discussed with the patient. The risks of bleeding, infection, recurrence of symptoms, failure to resolve symptoms, bile duct damage, bile duct leak, retained common bile duct stone, bowel injury, any of which could require further surgery and/or ERCP, stent, or papillotomy were reviewed with the patient. The likelihood of improving the patient's symptoms with return to their baseline status is good.  The patient and/or family concurred with the proposed plan, giving informed consent.  The patient was taken to Operating Room, identified  and the procedure verified as Laparoscopic Cholecystectomy.  A Time Out was held and the above information confirmed.  Prior to the induction of general anesthesia, antibiotic prophylaxis was administered. VTE prophylaxis was in place. General endotracheal anesthesia was then administered and tolerated well. After the induction, the abdomen was prepped with Chloraprep and draped in the sterile fashion. The patient was positioned in the supine position.  Cut down technique was used to enter the abdominal cavity and a Hasson trochar was placed after two vicryl stitches were anchored to the fascia. Pneumoperitoneum was then created with CO2 and tolerated well without any adverse changes in the patient's vital signs.  Three 8-mm ports were placed under direct vision. All skin incisions  were infiltrated with a local anesthetic agent before making  the incision and placing the trocars.   The patient was positioned  in reverse Trendelenburg, robot was brought to the surgical field and docked in the standard fashion.  We made sure all the instrumentation was kept indirect view at all times and that there were no collision between the arms. I scrubbed out and went to the console.  The gallbladder was identified, the fundus grasped and retracted cephalad. Adhesions were lysed bluntly. The infundibulum was grasped and retracted laterally, exposing the peritoneum overlying the triangle of Calot. This was then divided and exposed in a blunt fashion. An extended critical view of the cystic duct and cystic artery was obtained.  The cystic duct was clearly identified and bluntly dissected.   Artery and duct were double clipped and divided. Using ICG cholangiography we visualize the cystic duct CBD w/o evidence of bile injuries. The gallbladder was taken from the gallbladder fossa in a retrograde fashion with the electrocautery.  Hemostasis was achieved with the electrocautery. nspection of the right upper quadrant was performed. No bleeding, bile duct injury or leak, or bowel injury was noted. Robotic instruments and robotic arms were undocked in the standard fashion.  I scrubbed back in.  The gallbladder was removed and placed in an Endocatch bag.   Pneumoperitoneum was released.  The periumbilical port site was closed with interrumpted 0 Vicryl sutures. 4-0 subcuticular Monocryl was used to close the skin. Dermabond was  applied.  The patient was then extubated and brought to the recovery room in stable condition. Sponge, lap, and needle counts were correct at closure and at the conclusion of the case.               Evelia Hipp, MD, FACS

## 2023-10-22 NOTE — Progress Notes (Signed)
 Patient family did not get to speak to surgeon afterwards, Dr. Dana Duncan came to see family in post op at the car before d/c.

## 2023-10-22 NOTE — Discharge Instructions (Addendum)
 Laparoscopic Cholecystectomy, Care After   These instructions give you information on caring for yourself after your procedure. Your doctor may also give you more specific instructions. Call your doctor if you have any problems or questions after your procedure.  HOME CARE  Change your bandages (dressings) as told by your doctor.  Keep the wound dry and clean. Wash the wound gently with soap and water. Pat the wound dry with a clean towel.  Do not take baths, swim, or use hot tubs for 2 weeks, or as told by your doctor.  Only take medicine as told by your doctor.  Eat a normal diet as told by your doctor.  Do not lift anything heavier than 10 pounds (4.5 kg) until your doctor says it is okay.  Do not play contact sports for 1 week, or as told by your doctor. GET HELP IF:  Your wound is red, puffy (swollen), or painful.  You have yellowish-white fluid (pus) coming from the wound.  You have fluid draining from the wound for more than 1 day.  You have a bad smell coming from the wound.  Your wound breaks open. GET HELP RIGHT AWAY IF:  You have trouble breathing.  You have chest pain.  You have a fever >101  You have pain in the shoulders (shoulder strap areas) that is getting worse.  You feel dizzy or pass out (faint).  You have severe belly (abdominal) pain.  You feel sick to your stomach (nauseous) or throw up (vomit) for more than 1 day.

## 2023-10-22 NOTE — Anesthesia Procedure Notes (Signed)
 Procedure Name: Intubation Date/Time: 10/22/2023 11:39 AM  Performed by: Noelia Batman, CRNAPre-anesthesia Checklist: Patient identified, Emergency Drugs available, Suction available and Patient being monitored Patient Re-evaluated:Patient Re-evaluated prior to induction Oxygen Delivery Method: Circle System Utilized Preoxygenation: Pre-oxygenation with 100% oxygen Induction Type: IV induction Ventilation: Mask ventilation without difficulty and Oral airway inserted - appropriate to patient size Laryngoscope Size: McGrath and 3 Grade View: Grade I Tube type: Oral Tube size: 7.0 mm Number of attempts: 1 Airway Equipment and Method: Stylet and Oral airway Placement Confirmation: ETT inserted through vocal cords under direct vision, positive ETCO2 and breath sounds checked- equal and bilateral Secured at: 22 cm Tube secured with: Tape Dental Injury: Teeth and Oropharynx as per pre-operative assessment

## 2023-10-22 NOTE — Interval H&P Note (Signed)
 History and Physical Interval Note:  10/22/2023 10:32 AM  Janet Murray  has presented today for surgery, with the diagnosis of Calculus of gallbladder without cholecystitis without obstruction.  The various methods of treatment have been discussed with the patient and family. After consideration of risks, benefits and other options for treatment, the patient has consented to  Procedure(s) with comments: CHOLECYSTECTOMY, ROBOT-ASSISTED, LAPAROSCOPIC (N/A) - ICG as a surgical intervention.  The patient's history has been reviewed, patient examined, no change in status, stable for surgery.  I have reviewed the patient's chart and labs.  Questions were answered to the patient's satisfaction.     Daeshon Grammatico F Maryela Tapper

## 2023-10-22 NOTE — Anesthesia Preprocedure Evaluation (Signed)
 Anesthesia Evaluation  Patient identified by MRN, date of birth, ID band Patient awake    Reviewed: Allergy & Precautions, NPO status , Patient's Chart, lab work & pertinent test results  History of Anesthesia Complications Negative for: history of anesthetic complications  Airway Mallampati: III   Neck ROM: Full    Dental  (+) Edentulous Upper, Edentulous Lower, Dental Advidsory Given   Pulmonary shortness of breath and with exertion, asthma , neg sleep apnea, COPD,  COPD inhaler, neg recent URI, Current Smoker and Patient abstained from smoking.   Pulmonary exam normal breath sounds clear to auscultation       Cardiovascular Exercise Tolerance: Good (-) hypertension(-) angina (-) CAD, (-) Past MI and (-) Cardiac Stents Normal cardiovascular exam+ dysrhythmias (incomplete RBBB)  Rhythm:Regular Rate:Normal  ECG 04/10/21: normal   Neuro/Psych  Headaches, neg Seizures PSYCHIATRIC DISORDERS Anxiety     Chronic pain  Neuromuscular disease (peripheral neuropathy)    GI/Hepatic ,GERD  Controlled,,Fatty liver   Endo/Other  neg diabetesHypothyroidism  Obesity   Renal/GU negative Renal ROS     Musculoskeletal   Abdominal  (+) + obese  Peds  Hematology negative hematology ROS (+)   Anesthesia Other Findings Past Medical History: No date: Anginal pain (HCC) No date: Anxiety No date: Arthritis No date: Asthma No date: Biliary colic No date: Carpal tunnel syndrome of right wrist No date: Cholelithiasis No date: Chronic pain No date: Colon cancer (HCC) No date: COPD (chronic obstructive pulmonary disease) (HCC) No date: Coronary artery disease 03/22/2018: Cyst of right kidney No date: DDD (degenerative disc disease), lumbar No date: Degenerative joint disease (DJD) of hip 03/25/2018: Diarrhea of infectious origin     Comment:  a.) culture (+) for campylobacter species No date: Dysphagia No date: Dyspnea No date:  Emphysema of lung (HCC) 10/25/2014: Facet syndrome, lumbar 03/25/2018: Fatty liver No date: Follicular thyroid  carcinoma (HCC)     Comment:  a.) stage I --> s/p total thyroidectomy 05/02/2015: Foot fracture, left No date: Ganglion cyst of dorsum of right wrist 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 No date: GERD (gastroesophageal reflux disease) 11/06/2014: Hereditary and idiopathic peripheral neuropathy No date: HLD (hyperlipidemia) No date: Iatrogenic hypothyroidism     Comment:  a.) s/p radioactive idodine and subsequent total               thyroidectomy for stage I follicular thyroid  carcinoma No date: IBS (irritable bowel syndrome) No date: Incomplete RBBB No date: Long term prescription opiate use     Comment:  a.) managed by PCP; has naloxone Rx available No date: Low back pain 02/04/2016: Migraine No date: Occipital neuralgia 10/25/2014: Osteoarthritis of spine with radiculopathy, lumbar region No date: RLS (restless legs syndrome)     Comment:  a.) takes pramipexole  10/25/2014: Sacroiliac joint dysfunction of both sides No date: Spondylosis of lumbar spine No date: Stomach cancer (HCC) No date: Tobacco use No date: Tubular adenoma of colon No date: Urinary incontinence   Reproductive/Obstetrics                             Anesthesia Physical Anesthesia Plan  ASA: 3  Anesthesia Plan: General   Post-op Pain Management:    Induction: Intravenous  PONV Risk Score and Plan: 2 and Treatment may vary due to age or medical condition, Ondansetron , Dexamethasone  and Midazolam   Airway Management Planned: Oral ETT  Additional Equipment:   Intra-op Plan:   Post-operative Plan: Extubation in OR  Informed Consent: I  have reviewed the patients History and Physical, chart, labs and discussed the procedure including the risks, benefits and alternatives for the proposed anesthesia with the patient or authorized representative who has indicated his/her  understanding and acceptance.     Dental advisory given  Plan Discussed with: CRNA  Anesthesia Plan Comments: (Patient consented for risks of anesthesia including but not limited to:  - adverse reactions to medications - damage to eyes, teeth, lips or other oral mucosa - nerve damage due to positioning  - sore throat or hoarseness - damage to heart, brain, nerves, lungs, other parts of body or loss of life  Informed patient about role of CRNA in peri- and intra-operative care.  Patient voiced understanding.)        Anesthesia Quick Evaluation

## 2023-10-23 ENCOUNTER — Encounter: Payer: Self-pay | Admitting: Surgery

## 2023-10-23 DIAGNOSIS — R002 Palpitations: Secondary | ICD-10-CM | POA: Diagnosis not present

## 2023-10-26 LAB — SURGICAL PATHOLOGY

## 2023-10-28 DIAGNOSIS — R002 Palpitations: Secondary | ICD-10-CM | POA: Diagnosis not present

## 2023-10-29 ENCOUNTER — Ambulatory Visit: Payer: Self-pay | Admitting: Physician Assistant

## 2023-10-29 IMAGING — MR MR LUMBAR SPINE W/O CM
5 series · 31 of 48 positions shown · non-contrast
Comparison: MRI 07/27/2021.

CLINICAL DATA: Osteoarthritis of spine with radiculopathy, lumbar
region 76F.XP (L9G-HR-CM). Other spondylosis with radiculopathy,
lumbar region 76F.XP (L9G-HR-CM). Radiculopathy of lumbar region
MSD.LH (L9G-HR-CM). Other intervertebral disc degeneration, lumbar
region RL3.1V (L9G-HR-CM).

EXAM:
MRI LUMBAR SPINE WITHOUT CONTRAST
TECHNIQUE: Multiplanar, multisequence MR imaging of the lumbar spine was
performed. No intravenous contrast was administered.

[Series 5: T2 · sagittal · 4.0mm · 0.81mm/px · 6 of 15 slices shown (1 of 2)]
[im 1/15]
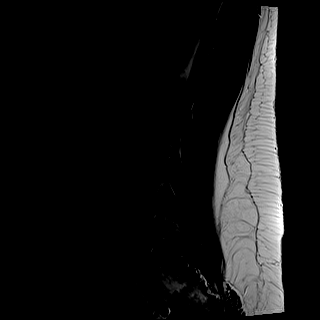
[im 3/15]
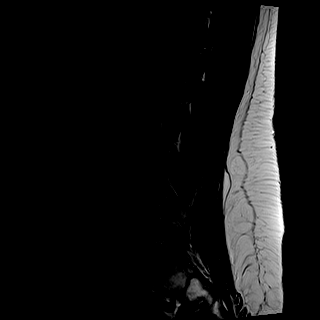
[im 6/15]
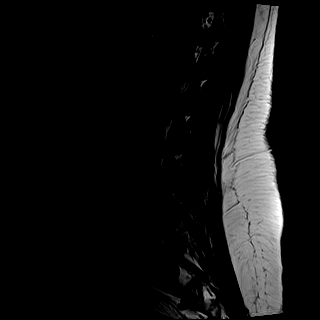
[im 9/15]
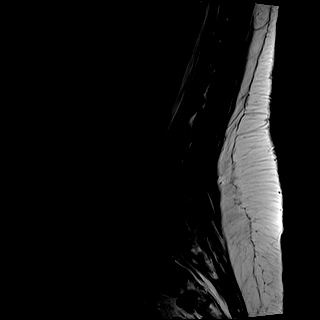
[im 12/15]
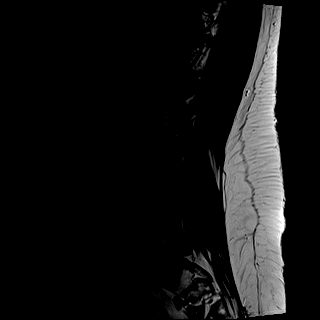
[im 15/15]
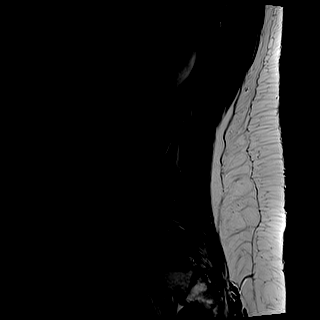

[Series 6: T1 · sagittal · 4.0mm · 0.81mm/px · 7 of 15 slices shown (1 of 2)]
[im 1/15]
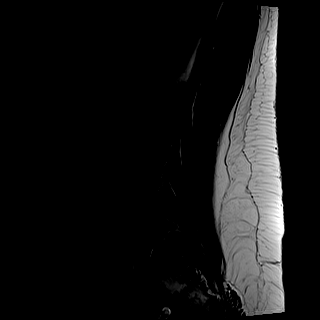
[im 3/15]
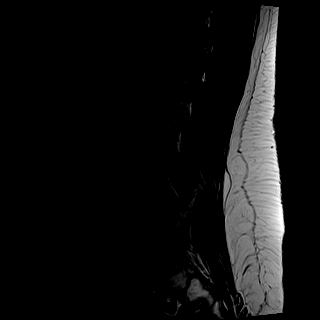
[im 5/15]
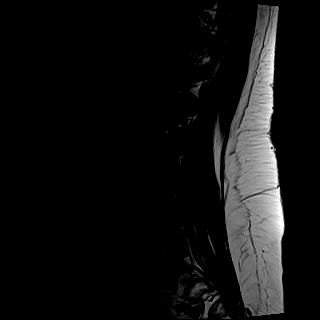
[im 8/15]
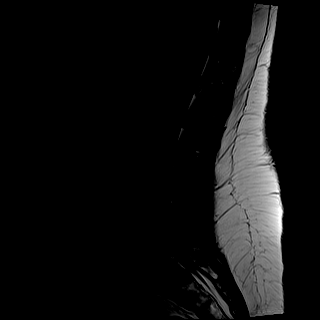
[im 10/15]
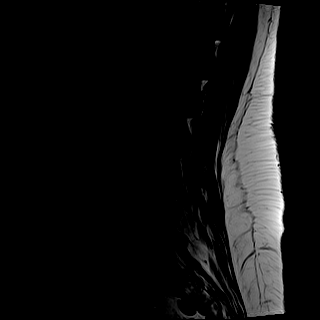
[im 12/15]
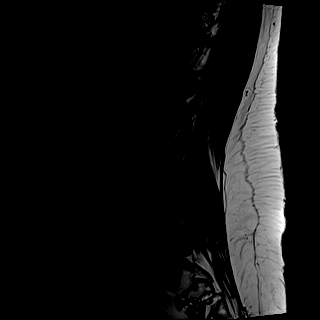
[im 15/15]
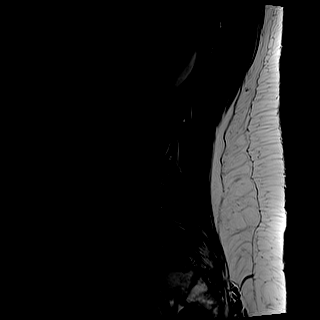

[Series 7: STIR · sagittal · 4.0mm · 0.41mm/px · 2 of 15 slices shown]
[im 1/15]
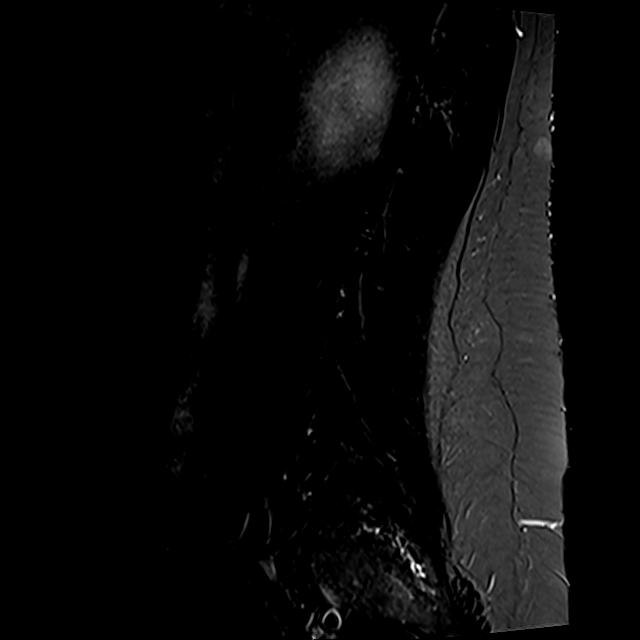
[im 3/15]
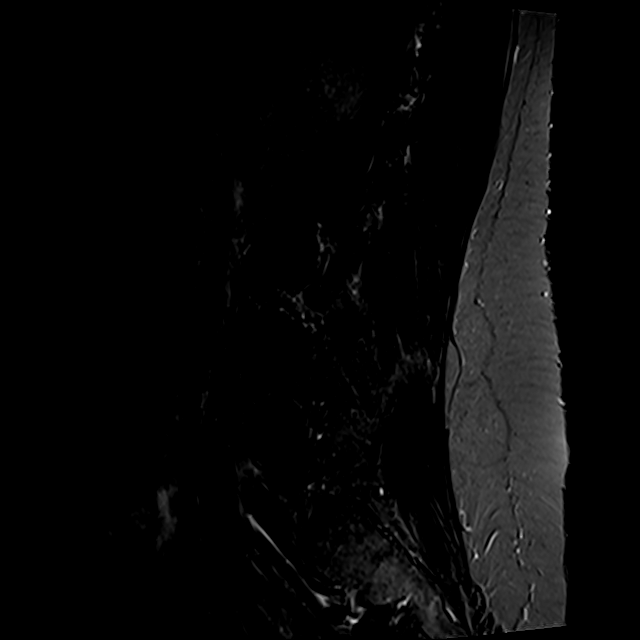

[Series 8: T2 · axial · 4.0mm · 0.78mm/px · z∈[-101,+82]mm · 8 of 29 slices shown (2 of 2)]
[im 1/29]
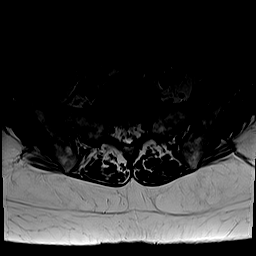
[im 5/29]
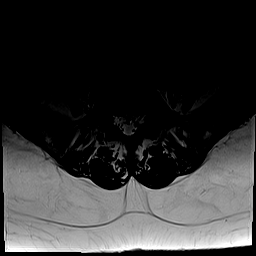
[im 9/29]
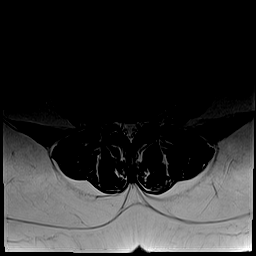
[im 13/29]
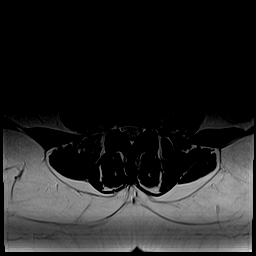
[im 16/29]
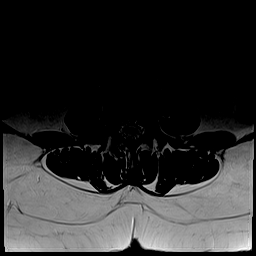
[im 20/29]
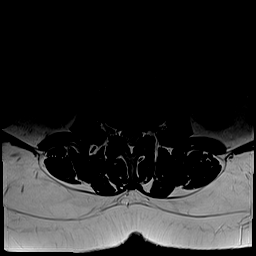
[im 24/29]
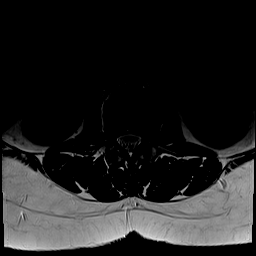
[im 29/29]
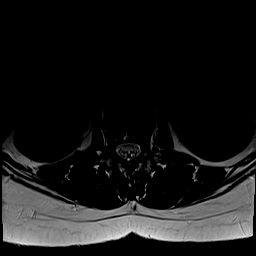

[Series 9: T1 · axial · 4.0mm · 0.39mm/px · z∈[-101,+82]mm · 8 of 29 slices shown (2 of 2)]
[im 1/29]
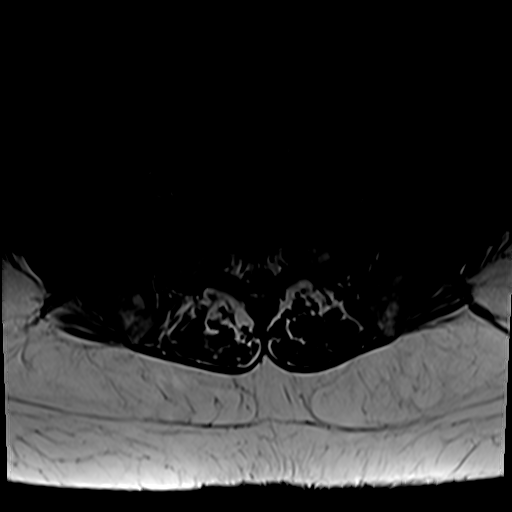
[im 5/29]
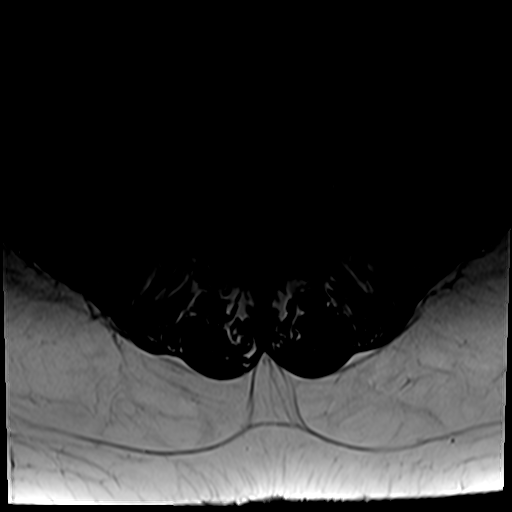
[im 9/29]
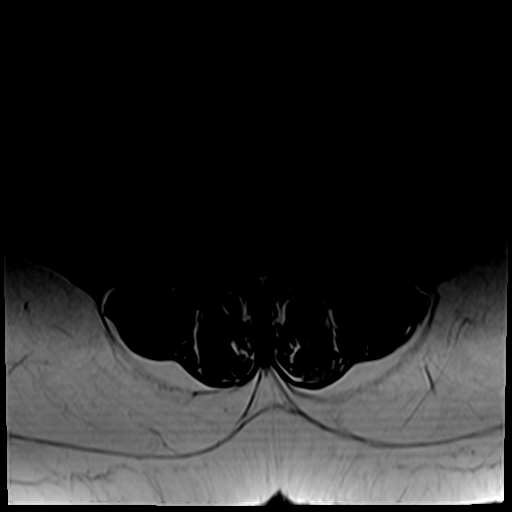
[im 13/29]
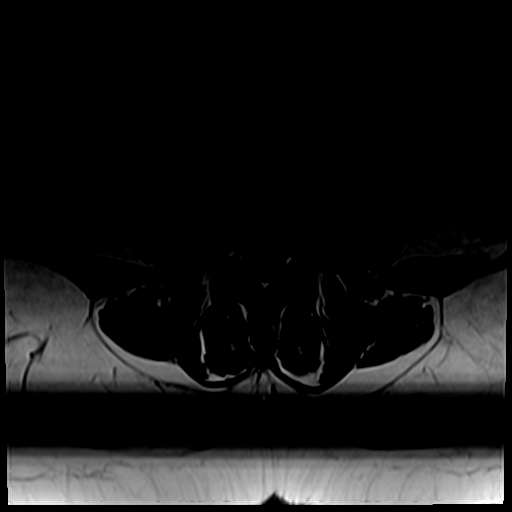
[im 16/29]
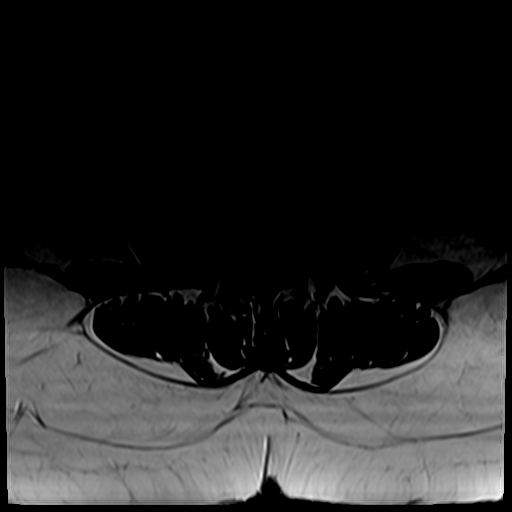
[im 20/29]
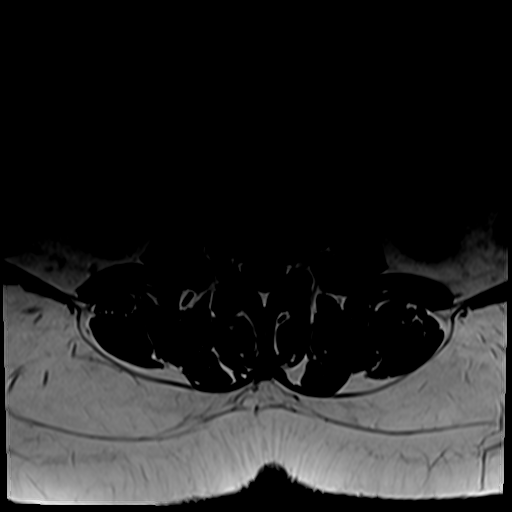
[im 24/29]
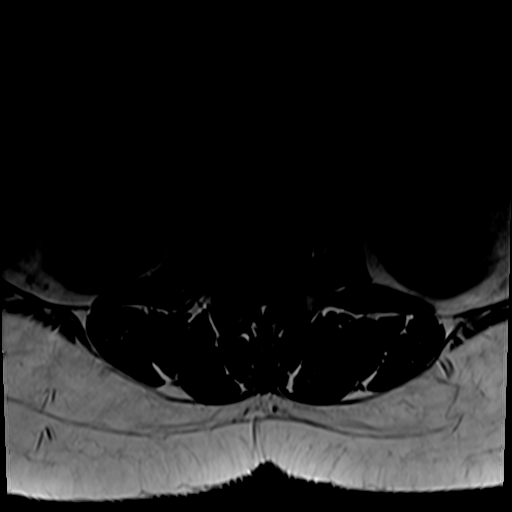
[im 29/29]
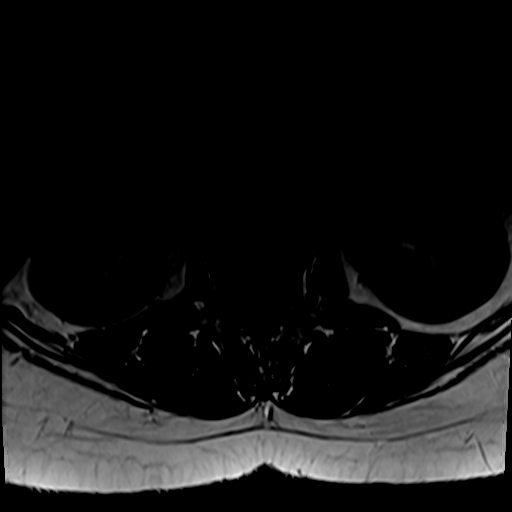

[31 of 48 positions shown; findings below may reference images not displayed]

FINDINGS: Segmentation:  Standard.

Alignment:  Physiologic.

Vertebrae:  No fracture, evidence of discitis, or bone lesion.

Conus medullaris and cauda equina: Conus extends to the L1 level.
Conus and cauda equina appear normal.

Paraspinal and other soft tissues: Negative.

Disc levels:

T12-L1:No spinal canal or neural foraminal stenosis.

L1-2:No spinal canal or neural foraminal stenosis.

L2-3:No spinal canal or neural foraminal stenosis.

L3-4:No spinal canal or neural foraminal stenosis.

L4-5:Shallow disc bulge, mild facet degenerative changes and mild
ligament flavum redundancy resulting in mild spinal canal stenosis
and mild bilateral neural foraminal narrowing.

L5-S1:Mild facet degenerative changes.No spinal canal or neural
foraminal stenosis.
IMPRESSION: Mild degenerative changes of the lumbar spine with mild spinal canal
stenosis and mild bilateral neural foraminal narrowing at L4-5.

## 2023-11-04 ENCOUNTER — Ambulatory Visit (INDEPENDENT_AMBULATORY_CARE_PROVIDER_SITE_OTHER): Admitting: Physician Assistant

## 2023-11-04 ENCOUNTER — Encounter: Payer: Self-pay | Admitting: Physician Assistant

## 2023-11-04 VITALS — BP 129/79 | HR 71 | Temp 99.3°F | Ht 61.0 in | Wt 178.0 lb

## 2023-11-04 DIAGNOSIS — Z09 Encounter for follow-up examination after completed treatment for conditions other than malignant neoplasm: Secondary | ICD-10-CM

## 2023-11-04 DIAGNOSIS — K811 Chronic cholecystitis: Secondary | ICD-10-CM

## 2023-11-04 DIAGNOSIS — K8020 Calculus of gallbladder without cholecystitis without obstruction: Secondary | ICD-10-CM

## 2023-11-04 NOTE — Progress Notes (Signed)
 [facility] POST-OP OFFICE VISIT  11/04/2023  HPI: Janet Murray is a 53 y.o. female 13 days s/p robotic assisted laparoscopic cholecystectomy for chronic cholecystitis with Dr Dana Duncan  She is doing well A few short episodes of RUQ discomfort, last a few seconds and resolve No fever, chills, nausea, emesis She is following bland diet; no diarrhea Incisions are healing well Ambulating without issue No other complaints   Vital signs: BP 129/79   Pulse 71   Temp 99.3 F (37.4 C) (Oral)   Ht 5\' 1"  (1.549 m)   Wt 178 lb (80.7 kg)   SpO2 98%   BMI 33.63 kg/m    Physical Exam: Constitutional: Well appearing female, NAD Abdomen: Soft, non-tender, non-distended, no rebound/guarding Skin: Laparoscopic incisions are healing well, no erythema or drainage   Assessment/Plan: This is a 53 y.o. female 13 days s/p robotic assisted laparoscopic cholecystectomy for chronic cholecystitis with Dr Dana Duncan   - Reviewed dietary recommendations following cholecystectomy  - Pain control prn  - Reviewed wound care recommendation  - Reviewed lifting restrictions; 4 weeks total  - Reviewed surgical pathology; CCC  - She can follow up on as needed basis; She understands to call with questions/concerns  -- Apolonio Bay, PA-C [facility] 11/04/2023, 2:10 PM M-F: 7am - 4pm

## 2023-11-04 NOTE — Patient Instructions (Signed)

## 2023-11-08 NOTE — Anesthesia Postprocedure Evaluation (Signed)
 Anesthesia Post Note  Patient: MIGUELINA FORE  Procedure(s) Performed: CHOLECYSTECTOMY, ROBOT-ASSISTED, LAPAROSCOPIC (Abdomen) INDOCYANINE GREEN  FLUORESCENCE IMAGING (ICG)  Patient location during evaluation: PACU Anesthesia Type: General Level of consciousness: awake and alert Pain management: pain level controlled Vital Signs Assessment: post-procedure vital signs reviewed and stable Respiratory status: spontaneous breathing, nonlabored ventilation, respiratory function stable and patient connected to nasal cannula oxygen Cardiovascular status: blood pressure returned to baseline and stable Postop Assessment: no apparent nausea or vomiting Anesthetic complications: no   No notable events documented.   Last Vitals:  Vitals:   10/22/23 1315 10/22/23 1341  BP: 126/74 125/70  Pulse: 71 71  Resp: 14 15  Temp:    SpO2: 94% 99%    Last Pain:  Vitals:   10/22/23 1341  TempSrc:   PainSc: 3                  Vanice Genre

## 2023-11-16 DIAGNOSIS — G894 Chronic pain syndrome: Secondary | ICD-10-CM | POA: Diagnosis not present

## 2023-11-17 ENCOUNTER — Other Ambulatory Visit: Payer: Self-pay | Admitting: Nurse Practitioner

## 2023-11-17 ENCOUNTER — Other Ambulatory Visit: Payer: Self-pay | Admitting: Pulmonary Disease

## 2023-11-18 ENCOUNTER — Other Ambulatory Visit: Payer: Self-pay | Admitting: Nurse Practitioner

## 2023-11-18 NOTE — Telephone Encounter (Signed)
 Copied from CRM [id number]. Topic: Clinical - Medication Refill >> Nov 18, 2023  2:16 PM Jalayah J wrote: Medication: albuterol  (VENTOLIN  HFA) 108 (90 Base) MCG/ACT inhaler  Has the patient contacted their pharmacy? Yes (Agent: If no, request that the patient contact the pharmacy for the refill. If patient does not wish to contact the pharmacy document the reason why and proceed with request.) (Agent: If yes, when and what did the pharmacy advise?)  This is the patient's preferred pharmacy:  [facility] - [place], [place] - [address] [place] [address] Phone: [phone] Fax: [phone]  Is this the correct pharmacy for this prescription? Yes If no, delete pharmacy and type the correct one.   Has the prescription been filled recently? Yes  Is the patient out of the medication? Yes  Has the patient been seen for an appointment in the last year OR does the patient have an upcoming appointment? Yes  Can we respond through MyChart? Yes  Agent: Please be advised that Rx refills may take up to 3 business days. We ask that you follow-up with your pharmacy.

## 2023-11-19 NOTE — Telephone Encounter (Signed)
 Requested Prescriptions  Pending Prescriptions Disp Refills   albuterol  (VENTOLIN  HFA) 108 (90 Base) MCG/ACT inhaler [Pharmacy Med Name: ALBUTEROL  SULFATE HFA 108 (90 BASE)] 8.5 g 4    Sig: INHALE 2 PUFFS INTO THE LUNGS EVERY 6 HOURS AS NEEDED FOR WHEEZING OR SHORTNESS OF BREATH     Pulmonology:  Beta Agonists 2 Passed - 11/19/2023 11:13 AM      Passed - Last BP in normal range    BP Readings from Last 1 Encounters:  11/04/23 129/79         Passed - Last Heart Rate in normal range    Pulse Readings from Last 1 Encounters:  11/04/23 71         Passed - Valid encounter within last 12 months    Recent Outpatient Visits           1 month ago Appointment canceled by hospital   [place] [facility] Aileen Alexanders, NP   2 months ago Acquired hypothyroidism   [place] [facility] Aileen Alexanders, NP   4 months ago Advanced care planning/counseling discussion   [place] [facility] Aileen Alexanders, NP       Future Appointments             In 1 week Dunn, Elvia Hammans, PA-C [facility] [place]   In 1 month Aileen Alexanders, NP [facility]

## 2023-11-24 ENCOUNTER — Other Ambulatory Visit: Payer: Self-pay | Admitting: Nurse Practitioner

## 2023-11-26 NOTE — Telephone Encounter (Signed)
 Requested medication (s) are due for refill today: yes  Requested medication (s) are on the active medication list: yes  Last refill:  02/16/23  Future visit scheduled: yes  Notes to clinic:  Unable to refill per protocol, cannot delegate.      Requested Prescriptions  Pending Prescriptions Disp Refills   ondansetron  (ZOFRAN -ODT) 4 MG disintegrating tablet [Pharmacy Med Name: ONDANSETRON  4 MG ODT] 30 tablet 0    Sig: TAKE 1 TABLET BY MOUTH EVERY 8 HOURS AS NEEDED     Not Delegated - Gastroenterology: Antiemetics - ondansetron  Failed - 11/26/2023  1:36 PM      Failed - This refill cannot be delegated      Passed - AST in normal range and within 360 days    AST  Date Value Ref Range Status  07/15/2023 13 0 - 40 IU/L Final   SGOT(AST)  Date Value Ref Range Status  10/29/2013 17 15 - 37 Unit/L Final         Passed - ALT in normal range and within 360 days    ALT  Date Value Ref Range Status  07/15/2023 10 0 - 32 IU/L Final   SGPT (ALT)  Date Value Ref Range Status  10/29/2013 13 12 - 78 U/L Final         Passed - Valid encounter within last 6 months    Recent Outpatient Visits           1 month ago Appointment canceled by hospital   [facility] Aileen Alexanders, NP   2 months ago Acquired hypothyroidism   [facility] Aileen Alexanders, NP   4 months ago Advanced care planning/counseling discussion   [facility] Aileen Alexanders, NP       Future Appointments             In 5 days Dunn, Elvia Hammans, PA-C [facility] [place]   In 1 month Aileen Alexanders, NP [place] [facility]

## 2023-11-30 ENCOUNTER — Ambulatory Visit: Payer: Self-pay

## 2023-11-30 ENCOUNTER — Ambulatory Visit: Admitting: Nurse Practitioner

## 2023-11-30 NOTE — Progress Notes (Deleted)
 Cardiology Office Note    Date:  11/30/2023   ID:  CLEMA SKOUSEN, DOB 13-Jul-1970, MRN [id number]  PCP:  Melvin Pao, NP  Cardiologist:  Redell Cave, MD  Electrophysiologist:  None   Chief Complaint: Follow-up  History of Present Illness:   Janet Murray is a 53 y.o. 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  She was evaluated as a new patient in 10/2022 for chest pain after having been evaluated in the ED in 08/2022 with negative high-sensitivity troponin and EKG showing sinus rhythm with an incomplete RBBB without acute ischemic changes.  At the time of her visit with cardiology she had been without further symptoms of angina or cardiac decompensation.  Echo on 12/15/2022 showed an EF of 55 to 60%, no regional wall motion abnormalities, normal LV diastolic function parameters, normal RV systolic function and ventricular cavity size, no significant valvular abnormalities, and an estimated right atrial pressure of 3 mmHg.  Coronary CTA on 01/14/2023 showed a calcium  score of 13.7 which was the 88th percentile.  There was less than 25% stenosis involving the mid LAD.  She was seen in the office in 01/2023 and was without symptoms of angina or cardiac decompensation.  She had decreased tobacco use to 1/2 pack/day.   She was seen in ED on 09/13/2023 with a 3-week history of intermittent chest tightness with shortness of breath.  Symptoms are worse with exertion and better with rest.  High-sensitivity troponin negative x 2.  D-dimer negative.  EKG showed sinus rhythm without acute ischemic changes with known incomplete RBBB.  Chest x-ray without active cardiopulmonary disease.  She was discharged to  outpatient follow-up.  She was last seen in our office in 09/30/2023 noting a 19-month history of tachypalpitations, particularly when she was startled, and were associated with chest discomfort.  Symptoms would typically last for 2 to 5 minutes and spontaneously resolved.  Zio in 09/2023 showed a predominant rhythm of sinus with an average rate of 76 bpm, 6 episodes of PSVT lasting up to 11 beats, and no evidence of sustained arrhythmias.  Lexiscan  MPI in 10/2023 showed no evidence of ischemia or infarction with an EF of 72% and was overall low risk.  No significant coronary artery calcifications were noted.  She underwent robotic assisted laparoscopic cholecystectomy on 10/22/2023 without significant cardiac complication.  ***   Labs independently reviewed: 09/2023 - TSH 0.170, free T4 elevated at 2.08 magnesium  2.1, Hgb 13.9, PLT 100, potassium 4.0, BUN 9, serum creatinine 0.62 07/2023 - TC 208, TG 84, HDL 52, LDL 141, albumin 4.2, AST/ALT normal  Past Medical History:  Diagnosis Date   Anginal pain (HCC)    Anxiety    Arthritis    Asthma    Biliary colic    Carpal tunnel syndrome of right wrist    Cholelithiasis    Chronic pain    Colon cancer (HCC)    COPD (chronic obstructive pulmonary disease) (HCC)    Coronary artery disease    Cyst of right kidney 03/22/2018   DDD (degenerative disc disease), lumbar    Degenerative joint disease (DJD) of hip    Diarrhea of infectious origin 03/25/2018   a.) culture (+) for campylobacter species   Dysphagia  Dyspnea    Emphysema of lung (HCC)    Facet syndrome, lumbar 10/25/2014   Fatty liver 03/25/2018   Follicular thyroid  carcinoma (HCC)    a.) stage I --> s/p total thyroidectomy   Foot fracture, left 05/02/2015   Ganglion cyst of dorsum of right wrist    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   GERD (gastroesophageal reflux disease)    Hereditary and idiopathic peripheral neuropathy 11/06/2014   HLD (hyperlipidemia)    Iatrogenic hypothyroidism     a.) s/p radioactive idodine and subsequent total thyroidectomy for stage I follicular thyroid  carcinoma   IBS (irritable bowel syndrome)    Incomplete RBBB    Long term prescription opiate use    a.) managed by PCP; has naloxone Rx available   Low back pain    Migraine 02/04/2016   Occipital neuralgia    Osteoarthritis of spine with radiculopathy, lumbar region 10/25/2014   RLS (restless legs syndrome)    a.) takes pramipexole    Sacroiliac joint dysfunction of both sides 10/25/2014   Spondylosis of lumbar spine    Stomach cancer (HCC)    Tobacco use    Tubular adenoma of colon    Urinary incontinence     Past Surgical History:  Procedure Laterality Date   ABDOMINAL HYSTERECTOMY     APPENDECTOMY     BLADDER SURGERY     sling - Dr. Nola    CESAREAN SECTION     COLONOSCOPY WITH PROPOFOL  N/A 04/11/2020   Procedure: COLONOSCOPY WITH PROPOFOL ;  Surgeon: Janalyn Keene NOVAK, MD;  Location: [facility] ENDOSCOPY;  Service: Endoscopy;  Laterality: N/A;   COLONOSCOPY WITH PROPOFOL  N/A 10/22/2021   Procedure: COLONOSCOPY WITH PROPOFOL ;  Surgeon: Unk Corinn Skiff, MD;  Location: [facility] ENDOSCOPY;  Service: Gastroenterology;  Laterality: N/A;   ESOPHAGOGASTRODUODENOSCOPY  04/26/2019   INDOCYANINE GREEN  FLUORESCENCE IMAGING (ICG)  10/22/2023   Procedure: INDOCYANINE GREEN  FLUORESCENCE IMAGING (ICG);  Surgeon: Jordis Laneta FALCON, MD;  Location: [facility] ORS;  Service: General;;   STOMACH SURGERY     THYROIDECTOMY N/A 04/12/2021   Procedure: THYROIDECTOMY, total;  Surgeon: Jordis Laneta FALCON, MD;  Location: [facility] ORS;  Service: General;  Laterality: N/A;   TOTAL ABDOMINAL HYSTERECTOMY W/ BILATERAL SALPINGOOPHORECTOMY      Current Medications: No outpatient medications have been marked as taking for the 12/01/23 encounter (Appointment) with Abigail Bernardino HERO, PA-C.    Allergies:   Penicillins, Acetaminophen , Ciprofloxacin , Aspirin, Hydrocodone , and Tape   Social History   Socioeconomic History    Marital status: Single    Spouse name: Not on file   Number of children: 2   Years of education: Not on file   Highest education level: 10th grade  Occupational History   Not on file  Tobacco Use   Smoking status: Every Day    Current packs/day: 0.50    Average packs/day: 0.5 packs/day for 25.0 years (12.5 ttl pk-yrs)    Types: Cigarettes   Smokeless tobacco: Never   Tobacco comments:    0.5PPD 11/11/2022 khj  Vaping Use   Vaping status: Never Used  Substance and Sexual Activity   Alcohol use: Not Currently    Comment: occassionally   Drug use: No   Sexual activity: Not Currently    Birth control/protection: Surgical    Comment: Hysterectomy  Other Topics Concern   Not on file  Social History Narrative   Lives at home with son and his girlfriend   Social Drivers of Health   Financial Resource Strain: Low Risk  (11/29/2023)  Overall Financial Resource Strain (CARDIA)    Difficulty of Paying Living Expenses: Not hard at all  Food Insecurity: No Food Insecurity (11/29/2023)   Hunger Vital Sign    Worried About Running Out of Food in the Last Year: Never true    Ran Out of Food in the Last Year: Never true  Transportation Needs: No Transportation Needs (11/29/2023)   PRAPARE - Administrator, Civil Service (Medical): No    Lack of Transportation (Non-Medical): No  Physical Activity: Insufficiently Active (11/29/2023)   Exercise Vital Sign    Days of Exercise per Week: 3 days    Minutes of Exercise per Session: 20 min  Stress: No Stress Concern Present (11/29/2023)   [employer] of Occupational Health - Occupational Stress Questionnaire    Feeling of Stress: Not at all  Social Connections: Unknown (11/29/2023)   Social Connection and Isolation Panel    Frequency of Communication with Friends and Family: More than three times a week    Frequency of Social Gatherings with Friends and Family: More than three times a week    Attends Religious Services: Patient  declined    Database administrator or Organizations: Patient declined    Attends Engineer, structural: Not on file    Marital Status: Divorced     Family History:  The patient's family history includes Arthritis in her father; Atrial fibrillation in her mother; Dementia in her maternal grandmother; Healthy in her brother, brother, brother, daughter, and son; Heart disease in her father and maternal grandmother; Hypercholesterolemia in her maternal grandmother; Hypertension in her father and maternal grandmother; Leukemia in her father; Neuropathy in her mother. There is no history of Stroke, Cancer, or Breast cancer.  ROS:   12-point review of systems is negative unless otherwise noted in the HPI.   EKGs/Labs/Other Studies Reviewed:    Studies reviewed were summarized above. The additional studies were reviewed today:  Lexiscan  MPI 10/16/2023:   The study is normal. The study is low risk.   No ST deviation was noted.   LV perfusion is normal. There is no evidence of ischemia. There is no evidence of infarction.   Left ventricular function is normal. Nuclear stress EF: 72%. End diastolic cavity size is normal. End systolic cavity size is normal.   No significant coronary calcifications noted. __________  Zio patch 09/2023: Patient had a min HR of 50 bpm, max HR of 185 bpm, and avg HR of 76 bpm. Predominant underlying rhythm was Sinus Rhythm. 3 Supraventricular Tachycardia runs occurred, the run with the fastest interval lasting 9 beats with a max rate of 185 bpm, the  longest lasting 11 beats with an avg rate of 151 bpm. Isolated SVEs were rare (<1.0%), SVE Couplets were rare (<1.0%), and SVE Triplets were rare (<1.0%). Isolated VEs were rare (<1.0%), VE Couplets were rare (<1.0%), and no VE Triplets were present.    Conclusion Average heart rate 76 bpm, range 50-185 bpm. 3 episodes of nonsustained SVT noted, longest lasting 11 beats. No atrial fibrillation or atrial flutter. No  sustained arrhythmias. __________  01/14/2023: FINDINGS: Aorta: Normal size. Mild ascending aortic wall calcifications. No dissection.   Aortic Valve:  Trileaflet.  No calcifications.   Coronary Arteries:  Normal coronary origin.  Right dominance.   RCA is a dominant artery. There is no plaque.   Left main gives rise to LAD and LCX arteries. LM has no disease.   LAD has calcified plaque in the mid  vessel causing minimal stenosis (<25%).   LCX is a non-dominant artery.  There is no plaque.   Other findings:   Normal pulmonary vein drainage into the left atrium.   Normal left atrial appendage without a thrombus.   Normal size of the pulmonary artery.   IMPRESSION: 1. Coronary calcium  score of 13.7. This was 88th percentile for age and sex matched control. 2. Normal coronary origin with right dominance. 3. Minimal stenosis in mid LAD (<25%). 4. CAD-RADS 1. Minimal non-obstructive CAD (0-24%). Consider preventive therapy and risk factor modification. __________   2D echo 12/15/2022: 1. Left ventricular ejection fraction, by estimation, is 55 to 60%. Left  ventricular ejection fraction by 2D MOD biplane is 56.8 %. The left  ventricle has normal function. The left ventricle has no regional wall  motion abnormalities. Left ventricular  diastolic parameters were normal. The average left ventricular global  longitudinal strain is -18.5 %. The global longitudinal strain is normal.   2. Right ventricular systolic function is normal. The right ventricular  size is normal.   3. The mitral valve is normal in structure. No evidence of mitral valve  regurgitation.   4. The aortic valve is tricuspid. Aortic valve regurgitation is not  visualized.   5. The inferior vena cava is normal in size with greater than 50%  respiratory variability, suggesting right atrial pressure of 3 mmHg.    EKG:  EKG is ordered today.  The EKG ordered today demonstrates ***  Recent Labs: 07/15/2023: ALT  10 09/13/2023: BUN 9; Creatinine, Ser 0.62; Hemoglobin 13.9; Magnesium  2.1; Platelets 100; Potassium 4.0; Sodium 141 09/15/2023: TSH 0.170  Recent Lipid Panel    Component Value Date/Time   CHOL 208 (H) 07/15/2023 1335   TRIG 84 07/15/2023 1335   HDL 52 07/15/2023 1335   CHOLHDL 4.0 07/15/2023 1335   LDLCALC 141 (H) 07/15/2023 1335    PHYSICAL EXAM:    VS:  There were no vitals taken for this visit.  BMI: There is no height or weight on file to calculate BMI.  Physical Exam  Wt Readings from Last 3 Encounters:  11/04/23 178 lb (80.7 kg)  10/07/23 184 lb 3.2 oz (83.6 kg)  09/30/23 183 lb (83 kg)     ASSESSMENT & PLAN:   Nonobstructive CAD:  Palpitations:  HLD: LDL 141 in 07/2023.   COPD/asthma with ongoing tobacco use:   {Are you ordering a CV Procedure (e.g. stress test, cath, DCCV, TEE, etc)?   Press F2        :[id number]}     Disposition: F/u with Dr. Darliss or an APP in ***.   Medication Adjustments/Labs and Tests Ordered: Current medicines are reviewed at length with the patient today.  Concerns regarding medicines are outlined above. Medication changes, Labs and Tests ordered today are summarized above and listed in the Patient Instructions accessible in Encounters.   Signed, Bernardino Bring, PA-C 11/30/2023 1:18 PM     [facility] [place] [address] [place], [place] [address] [phone]

## 2023-11-30 NOTE — Progress Notes (Deleted)
 There were no vitals taken for this visit.   Subjective:    Patient ID: Janet Murray, female    DOB: [birth year], 53 y.o.   MRN: [id number]  HPI: Janet Murray is a 53 y.o. female  No chief complaint on file.  UPPER RESPIRATORY TRACT INFECTION Worst symptom: Fever: {Blank single:19197::yes,no} Cough: {Blank single:19197::yes,no} Shortness of breath: {Blank single:19197::yes,no} Wheezing: {Blank single:19197::yes,no} Chest pain: {Blank single:19197::yes,no,yes, with cough} Chest tightness: {Blank single:19197::yes,no} Chest congestion: {Blank single:19197::yes,no} Nasal congestion: {Blank single:19197::yes,no} Runny nose: {Blank single:19197::yes,no} Post nasal drip: {Blank single:19197::yes,no} Sneezing: {Blank single:19197::yes,no} Sore throat: {Blank single:19197::yes,no} Swollen glands: {Blank single:19197::yes,no} Sinus pressure: {Blank single:19197::yes,no} Headache: {Blank single:19197::yes,no} Face pain: {Blank single:19197::yes,no} Toothache: {Blank single:19197::yes,no} Ear pain: {Blank single:19197::yes,no} {Blank single:19197::right,left, bilateral} Ear pressure: {Blank single:19197::yes,no} {Blank single:19197::right,left, bilateral} Eyes red/itching:{Blank single:19197::yes,no} Eye drainage/crusting: {Blank single:19197::yes,no}  Vomiting: {Blank single:19197::yes,no} Rash: {Blank single:19197::yes,no} Fatigue: {Blank single:19197::yes,no} Sick contacts: {Blank single:19197::yes,no} Strep contacts: {Blank single:19197::yes,no}  Context: {Blank multiple:19196::better,worse,stable,fluctuating} Recurrent sinusitis: {Blank single:19197::yes,no} Relief with OTC cold/cough medications: {Blank single:19197::yes,no}  Treatments attempted: {Blank multiple:19196::none,cold/sinus,mucinex,anti-histamine,pseudoephedrine,cough  syrup,antibiotics}   Relevant past medical, surgical, family and social history reviewed and updated as indicated. Interim medical history since our last visit reviewed. Allergies and medications reviewed and updated.  Review of Systems  Per HPI unless specifically indicated above     Objective:    There were no vitals taken for this visit.  Wt Readings from Last 3 Encounters:  11/04/23 178 lb (80.7 kg)  10/07/23 184 lb 3.2 oz (83.6 kg)  09/30/23 183 lb (83 kg)    Physical Exam  Results for orders placed or performed during the hospital encounter of 10/22/23  Surgical pathology   Collection Time: 10/22/23 12:00 AM  Result Value Ref Range   SURGICAL PATHOLOGY      SURGICAL PATHOLOGY [facility] [address] [place], [place] [address] Telephone [phone] or [phone] Fax [phone]  REPORT OF SURGICAL PATHOLOGY   Accession #: [id number] Patient Name: Janet Murray Visit # : [id number]  MRN: [id number] Physician: Jordis Cliche DOB/Age [birth year] (Age: 69) Gender: F Collected Date: 10/22/2023 Received Date: 10/23/2023  FINAL DIAGNOSIS       1. Gallbladder,  :       -  CHRONIC CHOLECYSTITIS AND CHOLELITHIASIS.       DATE SIGNED OUT: 10/26/2023 ELECTRONIC SIGNATURE : Legolvan Do, Mark, Pathologist, Electronic Signature  MICROSCOPIC DESCRIPTION  CASE COMMENTS STAINS USED IN DIAGNOSIS: H&E    CLINICAL HISTORY  SPECIMEN(S) OBTAINED 1. Gallbladder,  SPECIMEN COMMENTS: SPECIMEN CLINICAL INFORMATION: 1. Calculus of gallbladder without cholecystitis without obstruction    Gross Description 1. Size/?Intact: 8.0 x 3.8 cm; intact      Serosal surface: Green-gray, smooth, a nd glistening with a mild amount of      adipose tissue and a mildly roughened hepatic bed.      Mucosa/Wall: Velvety, bile-stained mucosa; 0.1 cm thick wall (average). Discrete      lesions are not identified.      Contents: Moderate amount of  tenacious, dark green bile descending the lumen;      1.2 x 0.3 x 0.3 cm aggregate of multiple black stone fragments (up to 0.3 cm in      greatest dimension).      Cystic duct: 0.2 cm diameter, patent; received with a single white, plastic      clamp; inked black.      Block Summary: Representative sections including the cystic duct margin (en  face) are submitted in 1 block (1A).      [facility] 10/23/2023        Report signed out from the following location(s) . [facility] [address] [place], [place] [address] CLIA #: [id number]  [facility] [address] [place], [place] [address] CLIA #: [id number]       Assessment & Plan:   Problem List Items Addressed This Visit   None    Follow up plan: No follow-ups on file.

## 2023-11-30 NOTE — Telephone Encounter (Signed)
 FYI Only or Action Required?: FYI only for provider.  Patient was last seen in primary care on 10/19/2023 by Melvin Pao, NP. Called Nurse Triage reporting Cough. Symptoms began a week ago. Interventions attempted: OTC medications: Benadryl. Symptoms are: stable.  Triage Disposition: Home Care  Patient/caregiver understands and will follow disposition?: No, refuses dispositionFYI Only or Action Required?: FYI only for provider.  Patient was last seen in primary care on 10/19/2023 by Melvin Pao, NP. Called Nurse Triage reporting Cough. Symptoms began a week ago. Interventions attempted: OTC medications:  SABRA Symptoms are: stable.  Triage Disposition: Home Care - Pt wants to see provider for ABX.  Patient/caregiver understands and will follow disposition?: No, refuses disposition                     Copied from CRM [id number]. Topic: Clinical - Red Word Triage >> Nov 30, 2023  1:50 PM Elle L wrote: Red Word that prompted transfer to Nurse Triage: The patient missed her appointment today due to a lack of transportation and wanted to reschedule. However, the patient informed me that she has a sore throat, she is losing her voice, runny nose, and a productive cough. Reason for Disposition  Cough with cold symptoms (e.g., runny nose, postnasal drip, throat clearing)  Answer Assessment - Initial Assessment Questions 1. ONSET: When did the cough begin?      1 week 2. SEVERITY: How bad is the cough today?      Not a lot 3. SPUTUM: Describe the color of your sputum (none, dry cough; clear, white, yellow, green)     green 4. HEMOPTYSIS: Are you coughing up any blood? If so ask: How much? (flecks, streaks, tablespoons, etc.)     no 5. DIFFICULTY BREATHING: Are you having difficulty breathing? If Yes, ask: How bad is it? (e.g., mild, moderate, severe)    - MILD: No SOB at rest, mild SOB with walking, speaks normally in sentences, can lie down, no retractions,  pulse < 100.    - MODERATE: SOB at rest, SOB with minimal exertion and prefers to sit, cannot lie down flat, speaks in phrases, mild retractions, audible wheezing, pulse 100-120.    - SEVERE: Very SOB at rest, speaks in single words, struggling to breathe, sitting hunched forward, retractions, pulse > 120      no 6. FEVER: Do you have a fever? If Yes, ask: What is your temperature, how was it measured, and when did it start?     no 7. CARDIAC HISTORY: Do you have any history of heart disease? (e.g., heart attack, congestive heart failure)      no 8. LUNG HISTORY: Do you have any history of lung disease?  (e.g., pulmonary embolus, asthma, emphysema)     no 10. OTHER SYMPTOMS: Do you have any other symptoms? (e.g., runny nose, wheezing, chest pain)       Sore throat, losing her voice, Runny nose  Protocols used: Cough - Acute Productive-A-AH

## 2023-12-01 ENCOUNTER — Ambulatory Visit: Admitting: Physician Assistant

## 2023-12-02 ENCOUNTER — Ambulatory Visit (INDEPENDENT_AMBULATORY_CARE_PROVIDER_SITE_OTHER): Admitting: Nurse Practitioner

## 2023-12-02 ENCOUNTER — Encounter: Payer: Self-pay | Admitting: Nurse Practitioner

## 2023-12-02 VITALS — BP 98/60 | HR 68 | Temp 98.4°F | Ht 61.0 in | Wt 176.0 lb

## 2023-12-02 DIAGNOSIS — J0110 Acute frontal sinusitis, unspecified: Secondary | ICD-10-CM

## 2023-12-02 MED ORDER — DOXYCYCLINE HYCLATE 100 MG PO TABS: 100.0000 mg | ORAL_TABLET | Freq: Two times a day (BID) | ORAL | 0 refills | Status: DC

## 2023-12-02 MED ORDER — METHYLPREDNISOLONE 4 MG PO TBPK: ORAL_TABLET | ORAL | 0 refills | Status: DC

## 2023-12-02 NOTE — Progress Notes (Signed)
 BP 98/60   Pulse 68   Temp 98.4 F (36.9 C)   Ht 5' 1 (1.549 m)   Wt 176 lb (79.8 kg)   SpO2 95%   BMI 33.25 kg/m    Subjective:    Patient ID: Janet Murray, female    DOB: [birth year], 53 y.o.   MRN: [id number]  HPI: Janet Murray is a 53 y.o. female  Chief Complaint  Patient presents with   Cough    Present about a week, has taken singular and benadryl   UPPER RESPIRATORY TRACT INFECTION Worst symptom: symptoms started last week Fever: no Cough: yes Shortness of breath: yes Wheezing: yes Chest pain: yes, with cough Chest tightness: yes Chest congestion: yes Nasal congestion: yes Runny nose: yes Post nasal drip: yes Sneezing: yes Sore throat: sometimes Swollen glands: no Sinus pressure: yes Headache: yes Face pain: yes Toothache: no Ear pain: yes bilateral Ear pressure: yes bilateral Eyes red/itching:yes Eye drainage/crusting: no  Vomiting: no Rash: no Fatigue: yes Sick contacts: no Strep contacts: no  Context: stable Recurrent sinusitis: no Relief with OTC cold/cough medications: yes  Treatments attempted: anti-histamine   Relevant past medical, surgical, family and social history reviewed and updated as indicated. Interim medical history since our last visit reviewed. Allergies and medications reviewed and updated.  Review of Systems  Constitutional:  Positive for fatigue and fever.  HENT:  Positive for congestion, dental problem, ear pain, postnasal drip, rhinorrhea, sinus pressure, sinus pain, sneezing and sore throat.   Respiratory:  Positive for cough, shortness of breath and wheezing.   Cardiovascular:  Positive for chest pain.  Gastrointestinal:  Negative for vomiting.  Skin:  Negative for rash.  Neurological:  Positive for headaches.    Per HPI unless specifically indicated above     Objective:    BP 98/60   Pulse 68   Temp 98.4 F (36.9 C)   Ht 5' 1 (1.549 m)   Wt 176 lb (79.8 kg)   SpO2 95%   BMI 33.25 kg/m   Wt Readings  from Last 3 Encounters:  12/02/23 176 lb (79.8 kg)  11/04/23 178 lb (80.7 kg)  10/07/23 184 lb 3.2 oz (83.6 kg)    Physical Exam Vitals and nursing note reviewed.  Constitutional:      General: She is not in acute distress.    Appearance: Normal appearance. She is normal weight. She is not ill-appearing, toxic-appearing or diaphoretic.  HENT:     Head: Normocephalic.     Right Ear: Tympanic membrane and external ear normal.     Left Ear: Tympanic membrane and external ear normal.     Nose: Congestion and rhinorrhea present.     Right Sinus: Frontal sinus tenderness present. No maxillary sinus tenderness.     Left Sinus: Frontal sinus tenderness present. No maxillary sinus tenderness.     Mouth/Throat:     Mouth: Mucous membranes are moist.     Pharynx: Posterior oropharyngeal erythema present. No oropharyngeal exudate.   Eyes:     General:        Right eye: No discharge.        Left eye: No discharge.     Extraocular Movements: Extraocular movements intact.     Conjunctiva/sclera: Conjunctivae normal.     Pupils: Pupils are equal, round, and reactive to light.    Cardiovascular:     Rate and Rhythm: Normal rate and regular rhythm.     Heart sounds: No murmur heard. Pulmonary:  Effort: Pulmonary effort is normal. No respiratory distress.     Breath sounds: Normal breath sounds. No wheezing or rales.   Musculoskeletal:     Cervical back: Normal range of motion and neck supple.   Skin:    General: Skin is warm and dry.     Capillary Refill: Capillary refill takes less than 2 seconds.   Neurological:     General: No focal deficit present.     Mental Status: She is alert and oriented to person, place, and time. Mental status is at baseline.   Psychiatric:        Mood and Affect: Mood normal.        Behavior: Behavior normal.        Thought Content: Thought content normal.        Judgment: Judgment normal.     Results for orders placed or performed during the  hospital encounter of 10/22/23  Surgical pathology   Collection Time: 10/22/23 12:00 AM  Result Value Ref Range   SURGICAL PATHOLOGY      SURGICAL PATHOLOGY [facility] [address], [address] [place], [place] [address] Telephone [phone] or [phone] Fax [phone]  REPORT OF SURGICAL PATHOLOGY   Accession #: [id number] Patient Name: Janet, Murray Visit # : [id number]  MRN: [id number] Physician: Jordis Cliche DOB/Age [birth year] (Age: 44) Gender: F Collected Date: 10/22/2023 Received Date: 10/23/2023  FINAL DIAGNOSIS       1. Gallbladder,  :       -  CHRONIC CHOLECYSTITIS AND CHOLELITHIASIS.       DATE SIGNED OUT: 10/26/2023 ELECTRONIC SIGNATURE : Legolvan Do, Mark, Pathologist, Electronic Signature  MICROSCOPIC DESCRIPTION  CASE COMMENTS STAINS USED IN DIAGNOSIS: H&E    CLINICAL HISTORY  SPECIMEN(S) OBTAINED 1. Gallbladder,  SPECIMEN COMMENTS: SPECIMEN CLINICAL INFORMATION: 1. Calculus of gallbladder without cholecystitis without obstruction    Gross Description 1. Size/?Intact: 8.0 x 3.8 cm; intact      Serosal surface: Green-gray, smooth, a nd glistening with a mild amount of      adipose tissue and a mildly roughened hepatic bed.      Mucosa/Wall: Velvety, bile-stained mucosa; 0.1 cm thick wall (average). Discrete      lesions are not identified.      Contents: Moderate amount of tenacious, dark green bile descending the lumen;      1.2 x 0.3 x 0.3 cm aggregate of multiple black stone fragments (up to 0.3 cm in      greatest dimension).      Cystic duct: 0.2 cm diameter, patent; received with a single white, plastic      clamp; inked black.      Block Summary: Representative sections including the cystic duct margin (en      face) are submitted in 1 block (1A).      [facility] 10/23/2023        Report signed out from the following location(s) [place] [address] [place], [place] [address]  CLIA #: [id number]  [facility] [address] [place], [place] [address] CLIA #: [id number]       Assessment & Plan:   Problem List Items Addressed This Visit   None Visit Diagnoses       Acute non-recurrent frontal sinusitis    -  Primary   Will treat with doxycyline and medrol  dose pak. Continue with over the counter symptom management. If breathing hasn't improved, follow up in office.  Relevant Medications   doxycycline  (VIBRA -TABS) 100 MG tablet   methylPREDNISolone  (MEDROL  DOSEPAK) 4 MG TBPK tablet        Follow up plan: Return if symptoms worsen or fail to improve.

## 2023-12-15 ENCOUNTER — Other Ambulatory Visit: Payer: Self-pay | Admitting: Pulmonary Disease

## 2023-12-15 DIAGNOSIS — J439 Emphysema, unspecified: Secondary | ICD-10-CM

## 2023-12-15 DIAGNOSIS — G894 Chronic pain syndrome: Secondary | ICD-10-CM | POA: Diagnosis not present

## 2023-12-15 DIAGNOSIS — J449 Chronic obstructive pulmonary disease, unspecified: Secondary | ICD-10-CM

## 2023-12-15 DIAGNOSIS — F17210 Nicotine dependence, cigarettes, uncomplicated: Secondary | ICD-10-CM | POA: Diagnosis not present

## 2023-12-15 DIAGNOSIS — M5481 Occipital neuralgia: Secondary | ICD-10-CM | POA: Diagnosis not present

## 2023-12-17 ENCOUNTER — Ambulatory Visit: Admitting: Physician Assistant

## 2023-12-18 ENCOUNTER — Emergency Department

## 2023-12-18 ENCOUNTER — Emergency Department
Admission: EM | Admit: 2023-12-18 | Discharge: 2023-12-18 | Disposition: A | Discharge status: Home / Self Care | Attending: Emergency Medicine | Admitting: Emergency Medicine

## 2023-12-18 ENCOUNTER — Other Ambulatory Visit: Payer: Self-pay | Admitting: Nurse Practitioner

## 2023-12-18 ENCOUNTER — Other Ambulatory Visit: Payer: Self-pay

## 2023-12-18 DIAGNOSIS — Z85038 Personal history of other malignant neoplasm of large intestine: Secondary | ICD-10-CM | POA: Diagnosis not present

## 2023-12-18 DIAGNOSIS — Z79899 Other long term (current) drug therapy: Secondary | ICD-10-CM | POA: Diagnosis not present

## 2023-12-18 DIAGNOSIS — I2510 Atherosclerotic heart disease of native coronary artery without angina pectoris: Secondary | ICD-10-CM | POA: Diagnosis not present

## 2023-12-18 DIAGNOSIS — J42 Unspecified chronic bronchitis: Secondary | ICD-10-CM | POA: Insufficient documentation

## 2023-12-18 DIAGNOSIS — E039 Hypothyroidism, unspecified: Secondary | ICD-10-CM | POA: Diagnosis not present

## 2023-12-18 DIAGNOSIS — R0689 Other abnormalities of breathing: Secondary | ICD-10-CM | POA: Diagnosis not present

## 2023-12-18 DIAGNOSIS — R0602 Shortness of breath: Secondary | ICD-10-CM | POA: Diagnosis present

## 2023-12-18 DIAGNOSIS — R059 Cough, unspecified: Secondary | ICD-10-CM | POA: Diagnosis present

## 2023-12-18 DIAGNOSIS — R0789 Other chest pain: Secondary | ICD-10-CM | POA: Diagnosis not present

## 2023-12-18 DIAGNOSIS — J4489 Other specified chronic obstructive pulmonary disease: Secondary | ICD-10-CM | POA: Insufficient documentation

## 2023-12-18 DIAGNOSIS — R079 Chest pain, unspecified: Secondary | ICD-10-CM

## 2023-12-18 LAB — CBC
HCT: 40.6 % (ref 36.0–46.0)
Hemoglobin: 14 g/dL (ref 12.0–15.0)
MCH: 33.7 pg (ref 26.0–34.0)
MCHC: 34.5 g/dL (ref 30.0–36.0)
MCV: 97.6 fL (ref 80.0–100.0)
Platelets: 209 K/uL (ref 150–400)
RBC: 4.16 MIL/uL (ref 3.87–5.11)
RDW: 12.8 % (ref 11.5–15.5)
WBC: 6.4 K/uL (ref 4.0–10.5)
nRBC: 0 % (ref 0.0–0.2)

## 2023-12-18 LAB — BASIC METABOLIC PANEL WITH GFR
Anion gap: 12 (ref 5–15)
BUN: 7 mg/dL (ref 6–20)
CO2: 24 mmol/L (ref 22–32)
Calcium: 9.3 mg/dL (ref 8.9–10.3)
Chloride: 104 mmol/L (ref 98–111)
Creatinine, Ser: 0.53 mg/dL (ref 0.44–1.00)
GFR, Estimated: >60 mL/min (ref >60)
Glucose, Bld: 114 mg/dL — ABNORMAL HIGH (ref 70–99)
Potassium: 3.9 mmol/L (ref 3.5–5.1)
Sodium: 140 mmol/L (ref 135–145)

## 2023-12-18 LAB — RESP PANEL BY RT-PCR (RSV, FLU A&B, COVID)  RVPGX2
Influenza A by PCR: NEGATIVE
Influenza B by PCR: NEGATIVE
Resp Syncytial Virus by PCR: NEGATIVE
SARS Coronavirus 2 by RT PCR: NEGATIVE

## 2023-12-18 LAB — D-DIMER, QUANTITATIVE: D-Dimer, Quant: <0.27 ug{FEU}/mL (ref 0.00–0.50)

## 2023-12-18 LAB — TROPONIN I (HIGH SENSITIVITY)
Troponin I (High Sensitivity): 3 ng/L (ref <18)
Troponin I (High Sensitivity): 5 ng/L (ref <18)

## 2023-12-18 MED ORDER — PREDNISONE 10 MG (21) PO TBPK: ORAL_TABLET | ORAL | 0 refills | Status: DC

## 2023-12-18 MED ORDER — KETOROLAC TROMETHAMINE 30 MG/ML IJ SOLN
30.0000 mg | Freq: Once | INTRAMUSCULAR | Status: AC
  Administered 2023-12-18: 30 mg via INTRAVENOUS
  Filled 2023-12-18: qty 1

## 2023-12-18 MED ORDER — IPRATROPIUM-ALBUTEROL 0.5-2.5 (3) MG/3ML IN SOLN
3.0000 mL | Freq: Once | RESPIRATORY_TRACT | Status: AC
  Administered 2023-12-18: 3 mL via RESPIRATORY_TRACT
  Filled 2023-12-18: qty 3

## 2023-12-18 MED ORDER — ALBUTEROL SULFATE HFA 108 (90 BASE) MCG/ACT IN AERS: 2.0000 | INHALATION_SPRAY | RESPIRATORY_TRACT | 0 refills | Status: DC | PRN

## 2023-12-18 MED ORDER — METHYLPREDNISOLONE SODIUM SUCC 125 MG IJ SOLR
125.0000 mg | Freq: Once | INTRAMUSCULAR | Status: AC
  Administered 2023-12-18: 125 mg via INTRAVENOUS
  Filled 2023-12-18: qty 2

## 2023-12-18 NOTE — ED Triage Notes (Signed)
 Patient states tonight she woke up feeling like she could not breath with chest tightness. Chest tightness was in the middle, radiated to the right side. Rates pain 7/10 currently. Patient was scheduled to her cardiologist today, but they rescheduled the appt to 7/15. Patient was wearing a heart monitor and was scheduled to see them to review the results of the monitor.   No blood thinners. Hx HTN, high cholesterol

## 2023-12-18 NOTE — Telephone Encounter (Signed)
 Copied from CRM [id number]. Topic: Clinical - Medication Refill >> Dec 18, 2023 11:10 AM Charlet HERO wrote: Medication: albuterol  (PROVENTIL ) (2.5 MG/3ML) 0.083% nebulizer solution M   Has the patient contacted their pharmacy? No They told to call and get it filled  This is the patient's preferred pharmacy:  [facility] - [place], [place] - [address] [place] [address] Phone: [phone] Fax: [phone]  Is this the correct pharmacy for this prescription? Yes If no, delete pharmacy and type the correct one.   Has the prescription been filled recently? Yes  Is the patient out of the medication? Yes  Has the patient been seen for an appointment in the last year OR does the patient have an upcoming appointment? Yes  Can we respond through MyChart? Yes  Agent: Please be advised that Rx refills may take up to 3 business days. We ask that you follow-up with your pharmacy.

## 2023-12-18 NOTE — Telephone Encounter (Signed)
 Copied from CRM [id number]. Topic: Appointments - Appointment Scheduling >> Dec 18, 2023 11:14 AM Charlet HERO wrote: Patient/patient representative is calling to schedule an appointment. Refer to attachments for appointment information. Will need to be seen earlier she is still feeling really bad.

## 2023-12-18 NOTE — ED Provider Notes (Signed)
 [facility] Provider Note    Event Date/Time   First MD Initiated Contact with Patient 12/18/23 [phone]     (approximate)   History   Chest Pain   HPI  Janet Murray is a 53 y.o. female with history of chronic bronchitis, COPD, emphysema, continued tobacco use who presents to the emergency department complaints of chest pain, shortness of breath, nonproductive cough, chills.  No fever.  No history of PE, DVT.  No lower extremity swelling or pain.  Pain radiates into the right side of her chest.  No aggravating or relieving factors.   History provided by patient.    Past Medical History:  Diagnosis Date   Anginal pain (HCC)    Anxiety    Arthritis    Asthma    Biliary colic    Carpal tunnel syndrome of right wrist    Cholelithiasis    Chronic pain    Colon cancer (HCC)    COPD (chronic obstructive pulmonary disease) (HCC)    Coronary artery disease    Cyst of right kidney 03/22/2018   DDD (degenerative disc disease), lumbar    Degenerative joint disease (DJD) of hip    Diarrhea of infectious origin 03/25/2018   a.) culture (+) for campylobacter species   Dysphagia    Dyspnea    Emphysema of lung (HCC)    Facet syndrome, lumbar 10/25/2014   Fatty liver 03/25/2018   Follicular thyroid  carcinoma (HCC)    a.) stage I --> s/p total thyroidectomy   Foot fracture, left 05/02/2015   Ganglion cyst of dorsum of right wrist    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   GERD (gastroesophageal reflux disease)    Hereditary and idiopathic peripheral neuropathy 11/06/2014   HLD (hyperlipidemia)    Iatrogenic hypothyroidism    a.) s/p radioactive idodine and subsequent total thyroidectomy for stage I follicular thyroid  carcinoma   IBS (irritable bowel syndrome)    Incomplete RBBB    Long term prescription opiate use    a.) managed by PCP; has naloxone Rx available   Low back pain    Migraine 02/04/2016   Occipital neuralgia    Osteoarthritis of spine with  radiculopathy, lumbar region 10/25/2014   RLS (restless legs syndrome)    a.) takes pramipexole    Sacroiliac joint dysfunction of both sides 10/25/2014   Spondylosis of lumbar spine    Stomach cancer (HCC)    Tobacco use    Tubular adenoma of colon    Urinary incontinence     Past Surgical History:  Procedure Laterality Date   ABDOMINAL HYSTERECTOMY     APPENDECTOMY     BLADDER SURGERY     sling - Dr. Jeannene    CESAREAN SECTION     COLONOSCOPY WITH PROPOFOL  N/A 04/11/2020   Procedure: COLONOSCOPY WITH PROPOFOL ;  Surgeon: Janalyn Keene NOVAK, MD;  Location: [facility] ENDOSCOPY;  Service: Endoscopy;  Laterality: N/A;   COLONOSCOPY WITH PROPOFOL  N/A 10/22/2021   Procedure: COLONOSCOPY WITH PROPOFOL ;  Surgeon: Unk Corinn Skiff, MD;  Location: [facility] ENDOSCOPY;  Service: Gastroenterology;  Laterality: N/A;   ESOPHAGOGASTRODUODENOSCOPY  04/26/2019   INDOCYANINE GREEN  FLUORESCENCE IMAGING (ICG)  10/22/2023   Procedure: INDOCYANINE GREEN  FLUORESCENCE IMAGING (ICG);  Surgeon: Jordis Laneta FALCON, MD;  Location: [facility] ORS;  Service: General;;   STOMACH SURGERY     THYROIDECTOMY N/A 04/12/2021   Procedure: THYROIDECTOMY, total;  Surgeon: Jordis Laneta FALCON, MD;  Location: [facility] ORS;  Service: General;  Laterality: N/A;   TOTAL ABDOMINAL HYSTERECTOMY  W/ BILATERAL SALPINGOOPHORECTOMY      MEDICATIONS:  Prior to Admission medications   Medication Sig Start Date End Date Taking? Authorizing Provider  albuterol  (PROVENTIL ) (2.5 MG/3ML) 0.083% nebulizer solution Take 3 mLs (2.5 mg total) by nebulization every 6 (six) hours as needed for wheezing or shortness of breath. 02/28/21  Yes Parrett, Tammy S, NP  albuterol  (VENTOLIN  HFA) 108 (90 Base) MCG/ACT inhaler INHALE 2 PUFFS INTO THE LUNGS EVERY 6 HOURS AS NEEDED FOR WHEEZING OR SHORTNESS OF BREATH 11/19/23  Yes Melvin Pao, NP  AMBULATORY NON FORMULARY MEDICATION Medication Name: nebulizer DX: J44.9 12/07/17  Yes Verdia Art, MD  atorvastatin   (LIPITOR) 80 MG tablet TAKE 1 TABLET BY MOUTH DAILY 07/10/22  Yes Melvin Pao, NP  Cholecalciferol (VITAMIN D3) 125 MCG (5000 UT) CAPS Take 1 capsule (5,000 Units total) by mouth daily. 09/15/23  Yes Melvin Pao, NP  diclofenac Sodium (VOLTAREN) 1 % GEL Apply 1 application topically daily as needed (Knee pain). 09/18/20  Yes [provider]  esomeprazole  (NEXIUM ) 40 MG capsule TAKE 1 CAPSULE BY MOUTH TWICE DAILY 09/11/23  Yes Melvin Pao, NP  ezetimibe  (ZETIA ) 10 MG tablet Take 1 tablet (10 mg total) by mouth daily. 09/30/23 12/29/23 Yes Dunn, Ryan M, PA-C  Glycopyrrolate -Formoterol  (BEVESPI  AEROSPHERE) 9-4.8 MCG/ACT AERO USE 2 PUFFS BY MOUTH TWICE DAILY 12/15/23  Yes Tamea Dedra CROME, MD  hydrOXYzine  (VISTARIL ) 25 MG capsule Take 1 capsule (25 mg total) by mouth every 8 (eight) hours as needed. 09/15/23  Yes Melvin Pao, NP  levothyroxine  (SYNTHROID ) 175 MCG tablet Take 1 tablet (175 mcg total) by mouth daily. 09/16/23  Yes Melvin Pao, NP  methylPREDNISolone  (MEDROL  DOSEPAK) 4 MG TBPK tablet Take as directed 12/02/23  Yes Melvin Pao, NP  mirabegron  ER (MYRBETRIQ ) 50 MG TB24 tablet Take 1 tablet (50 mg total) by mouth daily. Patient taking differently: Take 50 mg by mouth daily as needed (overactive bladder). 02/10/23  Yes Zuleta, Kaitlin G, NP  montelukast  (SINGULAIR ) 10 MG tablet Take 1 tablet (10 mg total) by mouth at bedtime. 06/30/23  Yes Melvin Pao, NP  ondansetron  (ZOFRAN -ODT) 4 MG disintegrating tablet TAKE 1 TABLET BY MOUTH EVERY 8 HOURS AS NEEDED 11/26/23  Yes Melvin Pao, NP  Oxycodone  HCl 10 MG TABS Take 10 mg by mouth 3 (three) times daily as needed. 11/17/23  Yes [provider]  pramipexole  (MIRAPEX ) 0.5 MG tablet TAKE 1 TABLET BY MOUTH 3 TIMES DAILY 12/16/22  Yes Melvin Pao, NP  tirzepatide  (MOUNJARO ) 5 MG/0.5ML Pen Inject 5 mg into the skin once a week. 06/30/23  Yes Melvin Pao, NP  tiZANidine  (ZANAFLEX ) 2 MG tablet Take 2  mg by mouth 3 (three) times daily. 09/18/21  Yes [provider]  doxycycline  (VIBRA -TABS) 100 MG tablet Take 1 tablet (100 mg total) by mouth 2 (two) times daily. 12/02/23   Melvin Pao, NP  naloxone [facility]) nasal spray 4 mg/0.1 mL Place 0.4 mg into the nose once. 06/19/21   [provider]    Physical Exam   Triage Vital Signs: ED Triage Vitals  Encounter Vitals Group     BP 12/18/23 0126 121/66     Girls Systolic BP Percentile --      Girls Diastolic BP Percentile --      Boys Systolic BP Percentile --      Boys Diastolic BP Percentile --      Pulse Rate 12/18/23 0126 71     Resp 12/18/23 0126 18     Temp 12/18/23  0126 98.4 F (36.9 C)     Temp Source 12/18/23 0126 Oral     SpO2 12/18/23 0126 94 %     Weight 12/18/23 0126 172 lb (78 kg)     Height 12/18/23 0126 5' 3 (1.6 m)     Head Circumference --      Peak Flow --      Pain Score 12/18/23 0135 7     Pain Loc --      Pain Education --      Exclude from Growth Chart --     Most recent vital signs: Vitals:   12/18/23 0135 12/18/23 0521  BP:  108/73  Pulse:  72  Resp:  20  Temp:  98.3 F (36.8 C)  SpO2: 94% 91%    CONSTITUTIONAL: Alert, responds appropriately to questions. Well-appearing; well-nourished HEAD: Normocephalic, atraumatic EYES: Conjunctivae clear, pupils appear equal, sclera nonicteric ENT: normal nose; moist mucous membranes NECK: Supple, normal ROM CARD: RRR; S1 and S2 appreciated RESP: Normal chest excursion without splinting or tachypnea; breath sounds clear and equal bilaterally; no wheezes, no rhonchi, no rales, no hypoxia or respiratory distress, speaking full sentences, has diminished aeration bilaterally ABD/GI: Non-distended; soft, non-tender, no rebound, no guarding, no peritoneal signs BACK: The back appears normal EXT: Normal ROM in all joints; no deformity noted, no edema, no calf swelling or calf tenderness SKIN: Normal color for age and race; warm; no rash on  exposed skin NEURO: Moves all extremities equally, normal speech PSYCH: The patient's mood and manner are appropriate.   ED Results / Procedures / Treatments   LABS: (all labs ordered are listed, but only abnormal results are displayed) Labs Reviewed  BASIC METABOLIC PANEL WITH GFR - Abnormal; Notable for the following components:      Result Value   Glucose, Bld 114 (*)    All other components within normal limits  RESP PANEL BY RT-PCR (RSV, FLU A&B, COVID)  [id number]  CBC  D-DIMER, QUANTITATIVE  TROPONIN I (HIGH SENSITIVITY)  TROPONIN I (HIGH SENSITIVITY)     EKG:  EKG Interpretation Date/Time:  Friday December 18 2023 01:35:01 EDT Ventricular Rate:  66 PR Interval:  172 QRS Duration:  88 QT Interval:  402 QTC Calculation: 421 R Axis:   76  Text Interpretation: Normal sinus rhythm Normal ECG When compared with ECG of 13-Sep-2023 01:56, Incomplete right bundle branch block is no longer Present Confirmed by Neomi Neptune [id number]) on 12/18/2023 3:23:44 AM         RADIOLOGY: My personal review and interpretation of imaging: Chest x-ray clear.  I have personally reviewed all radiology reports.   DG Chest 2 View Result Date: 12/18/2023 CLINICAL DATA:  Chest tightness. Difficulty breathing due to chest tightness. Pain radiates to the right side. EXAM: CHEST - 2 VIEW COMPARISON:  09/13/2023 FINDINGS: The heart size and mediastinal contours are within normal limits. Both lungs are clear. The visualized skeletal structures are unremarkable. IMPRESSION: No active cardiopulmonary disease. Electronically Signed   By: Elsie Gravely M.D.   On: 12/18/2023 02:08     PROCEDURES:  Critical Care performed: No     .1-3 Lead EKG Interpretation  Performed by: Kaleab Frasier, Neptune SAILOR, DO Authorized by: Ronon Ferger, Neptune SAILOR, DO     Interpretation: normal     ECG rate:  72   ECG rate assessment: normal     Rhythm: sinus rhythm     Ectopy: none     Conduction: normal  IMPRESSION / MDM /  ASSESSMENT AND PLAN / ED COURSE  I reviewed the triage vital signs and the nursing notes.    Patient here for chest pain, shortness of breath.  The patient is on the cardiac monitor to evaluate for evidence of arrhythmia and/or significant heart rate changes.   DIFFERENTIAL DIAGNOSIS (includes but not limited to):   ACS, PE, pneumonia, COPD exacerbation, viral URI, less likely pneumothorax, dissection   Patient's presentation is most consistent with acute presentation with potential threat to life or bodily function.   PLAN: Will obtain cardiac labs, D-dimer, COVID and flu swab.  EKG nonischemic.  Chest x-ray reviewed and interpreted by myself and the radiologist and is clear.  Does have diminished aeration at bases bilaterally.  Suspect COPD/chronic bronchitis flare.  Will give breathing treatment, Solu-Medrol  and reassess.  Will give Toradol  for pain.   MEDICATIONS GIVEN IN ED: Medications  ipratropium-albuterol  (DUONEB) 0.5-2.5 (3) MG/3ML nebulizer solution 3 mL (3 mLs Nebulization Given 12/18/23 0347)  methylPREDNISolone  sodium succinate (SOLU-MEDROL ) 125 mg/2 mL injection 125 mg (125 mg Intravenous Given 12/18/23 0347)  ketorolac  (TORADOL ) 30 MG/ML injection 30 mg (30 mg Intravenous Given 12/18/23 0346)  ipratropium-albuterol  (DUONEB) 0.5-2.5 (3) MG/3ML nebulizer solution 3 mL (3 mLs Nebulization Given 12/18/23 0520)     ED COURSE: Troponin x 2 negative.  D-dimer negative.  COVID, flu and RSV negative.  Patient reports feeling better after breathing treatments, Solu-Medrol  and Toradol .  Nurse reports that on recheck sats are 86% on room air.  Will repeat breathing treatment and recheck sat, ambulatory sat.    Sats 95% at rest and with ambulation after second breathing treatment.  I feel she is safe for discharge home.  Will refill her albuterol  inhaler and discharge with steroid taper.     At this time, I do not feel there is any life-threatening condition present. I reviewed  all nursing notes, vitals, pertinent previous records.  All lab and urine results, EKGs, imaging ordered have been independently reviewed and interpreted by myself.  I reviewed all available radiology reports from any imaging ordered this visit.  Based on my assessment, I feel the patient is safe to be discharged home without further emergent workup and can continue workup as an outpatient as needed. Discussed all findings, treatment plan as well as usual and customary return precautions.  They verbalize understanding and are comfortable with this plan.  Outpatient follow-up has been provided as needed.  All questions have been answered.  CONSULTS: Admission considered but patient feeling better, workup reassuring.  No longer hypoxic.   OUTSIDE RECORDS REVIEWED: Reviewed recent cardiology notes.       FINAL CLINICAL IMPRESSION(S) / ED DIAGNOSES   Final diagnoses:  Nonspecific chest pain  Chronic bronchitis, unspecified chronic bronchitis type (HCC)     Rx / DC Orders   ED Discharge Orders          Ordered    predniSONE  (STERAPRED UNI-PAK 21 TAB) 10 MG (21) TBPK tablet        12/18/23 0437    albuterol  (VENTOLIN  HFA) 108 (90 Base) MCG/ACT inhaler  Every 4 hours PRN        12/18/23 0437             Note:  This document was prepared using Dragon voice recognition software and may include unintentional dictation errors.   Irvin Bastin, Josette SAILOR, DO 12/18/23 [phone]

## 2023-12-18 NOTE — Telephone Encounter (Unsigned)
 Copied from CRM [id number]. Topic: Clinical - Prescription Issue >> Dec 18, 2023  1:23 PM Tiffini S wrote: Reason for CRM: Patient called sating that she was at the emergency room today and wants her to start back doing breathing treatments. She needs the prescription for the  albuterol  (PROVENTIL ) (2.5 MG/3ML) 0.083% nebulizer solution per talking with the pharmacy

## 2023-12-18 NOTE — Telephone Encounter (Signed)
 I'm not sure what attachment are being referenced.  Patient was seen in the ED today.  If she needs an appt you an take a same day on Tuesday.

## 2023-12-18 NOTE — Telephone Encounter (Unsigned)
 Copied from CRM [id number]. Topic: Clinical - Medication Refill >> Dec 18, 2023 11:10 AM Charlet HERO wrote: Medication: albuterol  (PROVENTIL ) (2.5 MG/3ML) 0.083% nebulizer solution M   Has the patient contacted their pharmacy? No They told to call and get it filled  This is the patient's preferred pharmacy:  [facility] - [place], [place] - [address] [place] [address] Phone: [phone] Fax: [phone]  Is this the correct pharmacy for this prescription? Yes If no, delete pharmacy and type the correct one.   Has the prescription been filled recently? Yes  Is the patient out of the medication? Yes  Has the patient been seen for an appointment in the last year OR does the patient have an upcoming appointment? Yes  Can we respond through MyChart? Yes  Agent: Please be advised that Rx refills may take up to 3 business days. We ask that you follow-up with your pharmacy.

## 2023-12-21 MED ORDER — ALBUTEROL SULFATE (2.5 MG/3ML) 0.083% IN NEBU: 2.5000 mg | INHALATION_SOLUTION | Freq: Four times a day (QID) | RESPIRATORY_TRACT | 12 refills | Status: AC | PRN

## 2023-12-21 NOTE — Progress Notes (Unsigned)
 Cardiology Office Note    Date:  12/22/2023   ID:  NYCHELLE CASSATA, DOB 09-Jul-1970, MRN [id number]  PCP:  Melvin Pao, NP  Cardiologist:  Redell Cave, MD  Electrophysiologist:  None   Chief Complaint: Follow-up  History of Present Illness:   Janet Murray is a 53 y.o. 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  She was evaluated as a new patient in 10/2022 for chest pain after having been evaluated in the ED in 08/2022 with negative high-sensitivity troponin and EKG showing sinus rhythm with an incomplete RBBB without acute ischemic changes.  At the time of her visit with cardiology she had been without further symptoms of angina or cardiac decompensation.  Echo on 12/15/2022 showed an EF of 55 to 60%, no regional wall motion abnormalities, normal LV diastolic function parameters, normal RV systolic function and ventricular cavity size, no significant valvular abnormalities, and an estimated right atrial pressure of 3 mmHg.  Coronary CTA on 01/14/2023 showed a calcium  score of 13.7 which was the 88th percentile.  There was less than 25% stenosis involving the mid LAD.  She was seen in the office in 01/2023 and was without symptoms of angina or cardiac decompensation.  She had decreased tobacco use to 1/2 pack/day.   She was seen in ED on 09/13/2023 with a 3-week history of intermittent chest tightness with shortness of breath.  Symptoms are worse with exertion and better with rest.  High-sensitivity troponin negative x 2.  D-dimer negative.  EKG showed sinus rhythm without acute ischemic changes with known incomplete RBBB.  Chest x-ray without active cardiopulmonary disease.  She was  discharged to outpatient follow-up.  She was last seen in our office in 09/30/2023 noting a 35-month history of tachypalpitations, particularly when she was startled, and were associated with chest discomfort.  Symptoms would typically last for 2 to 5 minutes and spontaneously resolved.  Zio in 09/2023 showed a predominant rhythm of sinus with an average rate of 76 bpm, 6 episodes of PSVT lasting up to 11 beats, and no evidence of sustained arrhythmias.  Lexiscan  MPI in 10/2023 showed no evidence of ischemia or infarction with an EF of 72% and was overall low risk.  No significant coronary artery calcifications were noted.  She underwent robotic assisted laparoscopic cholecystectomy on 10/22/2023 without significant cardiac complication.  She was seen in the ED on 12/18/2023 for evaluation of nonproductive cough, chills, shortness of breath, and chest discomfort.  High-sensitivity troponin negative x 2.  D-dimer negative.  EKG without acute changes.  Chest x-ray without active cardiopulmonary disease.  She was diagnosed with bronchitis and treated with antibiotic and steroids.  She comes in today continuing to note paroxysms of palpitations that appear more pronounced than what she has previously experienced and are longer lasting.  She indicates these palpitations will last for 15 to 20 minutes and at times are associated with some dizziness.  No presyncope or syncope.  She notes her chest discomfort and breathing have improved with antibiotics and prednisone  use.  No falls or symptoms concerning for bleeding.   Labs independently reviewed: 12/2023 - Hgb 14.0, PLT 209, potassium 3.9, BUN 7, serum creatinine 0.53 09/2023 - TSH 0.170, free T4 elevated at 2.08 magnesium   2.1 07/2023 - TC 208, TG 84, HDL 52, LDL 141, albumin 4.2, AST/ALT normal  Past Medical History:  Diagnosis Date   Anginal pain (HCC)    Anxiety    Arthritis    Asthma    Biliary colic    Carpal tunnel syndrome of right wrist     Cholelithiasis    Chronic pain    Colon cancer (HCC)    COPD (chronic obstructive pulmonary disease) (HCC)    Coronary artery disease    Cyst of right kidney 03/22/2018   DDD (degenerative disc disease), lumbar    Degenerative joint disease (DJD) of hip    Diarrhea of infectious origin 03/25/2018   a.) culture (+) for campylobacter species   Dysphagia    Dyspnea    Emphysema of lung (HCC)    Facet syndrome, lumbar 10/25/2014   Fatty liver 03/25/2018   Follicular thyroid  carcinoma (HCC)    a.) stage I --> s/p total thyroidectomy   Foot fracture, left 05/02/2015   Ganglion cyst of dorsum of right wrist    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   GERD (gastroesophageal reflux disease)    Hereditary and idiopathic peripheral neuropathy 11/06/2014   HLD (hyperlipidemia)    Iatrogenic hypothyroidism    a.) s/p radioactive idodine and subsequent total thyroidectomy for stage I follicular thyroid  carcinoma   IBS (irritable bowel syndrome)    Incomplete RBBB    Long term prescription opiate use    a.) managed by PCP; has naloxone Rx available   Low back pain    Migraine 02/04/2016   Occipital neuralgia    Osteoarthritis of spine with radiculopathy, lumbar region 10/25/2014   RLS (restless legs syndrome)    a.) takes pramipexole    Sacroiliac joint dysfunction of both sides 10/25/2014   Spondylosis of lumbar spine    Stomach cancer (HCC)    Tobacco use    Tubular adenoma of colon    Urinary incontinence     Past Surgical History:  Procedure Laterality Date   ABDOMINAL HYSTERECTOMY     APPENDECTOMY     BLADDER SURGERY     sling - Dr. Dois    CESAREAN SECTION     COLONOSCOPY WITH PROPOFOL  N/A 04/11/2020   Procedure: COLONOSCOPY WITH PROPOFOL ;  Surgeon: Janalyn Keene NOVAK, MD;  Location: [facility] ENDOSCOPY;  Service: Endoscopy;  Laterality: N/A;   COLONOSCOPY WITH PROPOFOL  N/A 10/22/2021   Procedure: COLONOSCOPY WITH PROPOFOL ;  Surgeon: Unk Corinn Skiff, MD;  Location: [facility]  ENDOSCOPY;  Service: Gastroenterology;  Laterality: N/A;   ESOPHAGOGASTRODUODENOSCOPY  04/26/2019   INDOCYANINE GREEN  FLUORESCENCE IMAGING (ICG)  10/22/2023   Procedure: INDOCYANINE GREEN  FLUORESCENCE IMAGING (ICG);  Surgeon: Jordis Laneta FALCON, MD;  Location: [facility] ORS;  Service: General;;   STOMACH SURGERY     THYROIDECTOMY N/A 04/12/2021   Procedure: THYROIDECTOMY, total;  Surgeon: Jordis Laneta FALCON, MD;  Location: [facility] ORS;  Service: General;  Laterality: N/A;   TOTAL ABDOMINAL HYSTERECTOMY W/ BILATERAL SALPINGOOPHORECTOMY      Current Medications: Current Meds  Medication Sig   albuterol  (PROVENTIL ) (2.5 MG/3ML) 0.083% nebulizer solution Take 3 mLs (2.5 mg total) by nebulization every 6 (six) hours as needed for wheezing or shortness of breath.   albuterol  (VENTOLIN  HFA) 108 (90 Base) MCG/ACT inhaler INHALE 2 PUFFS INTO THE LUNGS EVERY 6 HOURS AS NEEDED FOR WHEEZING OR SHORTNESS OF BREATH   albuterol  (VENTOLIN  HFA) 108 (90 Base) MCG/ACT inhaler Inhale 2 puffs into the lungs every 4 (four) hours as needed for wheezing or  shortness of breath.   AMBULATORY NON FORMULARY MEDICATION Medication Name: nebulizer DX: J44.9   atorvastatin  (LIPITOR) 80 MG tablet TAKE 1 TABLET BY MOUTH DAILY   Cholecalciferol (VITAMIN D3) 125 MCG (5000 UT) CAPS Take 1 capsule (5,000 Units total) by mouth daily.   diclofenac Sodium (VOLTAREN) 1 % GEL Apply 1 application topically daily as needed (Knee pain).   doxycycline  (VIBRA -TABS) 100 MG tablet Take 1 tablet (100 mg total) by mouth 2 (two) times daily.   esomeprazole  (NEXIUM ) 40 MG capsule TAKE 1 CAPSULE BY MOUTH TWICE DAILY   ezetimibe  (ZETIA ) 10 MG tablet Take 1 tablet (10 mg total) by mouth daily.   Glycopyrrolate -Formoterol  (BEVESPI  AEROSPHERE) 9-4.8 MCG/ACT AERO USE 2 PUFFS BY MOUTH TWICE DAILY   hydrOXYzine  (VISTARIL ) 25 MG capsule Take 1 capsule (25 mg total) by mouth every 8 (eight) hours as needed.   levothyroxine  (SYNTHROID ) 175 MCG tablet Take 1 tablet (175  mcg total) by mouth daily.   methylPREDNISolone  (MEDROL  DOSEPAK) 4 MG TBPK tablet Take as directed   mirabegron  ER (MYRBETRIQ ) 50 MG TB24 tablet Take 1 tablet (50 mg total) by mouth daily. (Patient taking differently: Take 50 mg by mouth daily as needed (overactive bladder).)   montelukast  (SINGULAIR ) 10 MG tablet Take 1 tablet (10 mg total) by mouth at bedtime.   naloxone (NARCAN) nasal spray 4 mg/0.1 mL Place 0.4 mg into the nose once.   ondansetron  (ZOFRAN -ODT) 4 MG disintegrating tablet TAKE 1 TABLET BY MOUTH EVERY 8 HOURS AS NEEDED   Oxycodone  HCl 10 MG TABS Take 10 mg by mouth 3 (three) times daily as needed.   pramipexole  (MIRAPEX ) 0.5 MG tablet TAKE 1 TABLET BY MOUTH 3 TIMES DAILY   predniSONE  (STERAPRED UNI-PAK 21 TAB) 10 MG (21) TBPK tablet Take as directed   tirzepatide  (MOUNJARO ) 5 MG/0.5ML Pen Inject 5 mg into the skin once a week.   tiZANidine  (ZANAFLEX ) 2 MG tablet Take 2 mg by mouth 3 (three) times daily.    Allergies:   Penicillins, Acetaminophen , Ciprofloxacin , Aspirin, Hydrocodone , and Tape   Social History   Socioeconomic History   Marital status: Single    Spouse name: Not on file   Number of children: 2   Years of education: Not on file   Highest education level: 10th grade  Occupational History   Not on file  Tobacco Use   Smoking status: Every Day    Current packs/day: 0.50    Average packs/day: 0.5 packs/day for 25.0 years (12.5 ttl pk-yrs)    Types: Cigarettes   Smokeless tobacco: Never   Tobacco comments:    0.5PPD 11/11/2022 khj  Vaping Use   Vaping status: Never Used  Substance and Sexual Activity   Alcohol use: Not Currently    Comment: occassionally   Drug use: No   Sexual activity: Not Currently    Birth control/protection: Surgical    Comment: Hysterectomy  Other Topics Concern   Not on file  Social History Narrative   Lives at home with son and his girlfriend   Social Drivers of Corporate investment banker Strain: Low Risk  (11/29/2023)    Overall Financial Resource Strain (CARDIA)    Difficulty of Paying Living Expenses: Not hard at all  Food Insecurity: No Food Insecurity (11/29/2023)   Hunger Vital Sign    Worried About Running Out of Food in the Last Year: Never true    Ran Out of Food in the Last Year: Never true  Transportation Needs: No Transportation  Needs (11/29/2023)   PRAPARE - Administrator, Civil Service (Medical): No    Lack of Transportation (Non-Medical): No  Physical Activity: Insufficiently Active (11/29/2023)   Exercise Vital Sign    Days of Exercise per Week: 3 days    Minutes of Exercise per Session: 20 min  Stress: No Stress Concern Present (11/29/2023)   [employer] of Occupational Health - Occupational Stress Questionnaire    Feeling of Stress: Not at all  Social Connections: Unknown (11/29/2023)   Social Connection and Isolation Panel    Frequency of Communication with Friends and Family: More than three times a week    Frequency of Social Gatherings with Friends and Family: More than three times a week    Attends Religious Services: Patient declined    Database administrator or Organizations: Patient declined    Attends Engineer, structural: Not on file    Marital Status: Divorced     Family History:  The patient's family history includes Arthritis in her father; Atrial fibrillation in her mother; Dementia in her maternal grandmother; Healthy in her brother, brother, brother, daughter, and son; Heart disease in her father and maternal grandmother; Hypercholesterolemia in her maternal grandmother; Hypertension in her father and maternal grandmother; Leukemia in her father; Neuropathy in her mother. There is no history of Stroke, Cancer, or Breast cancer.  ROS:   12-point review of systems is negative unless otherwise noted in the HPI.   EKGs/Labs/Other Studies Reviewed:    Studies reviewed were summarized above. The additional studies were reviewed today:  Lexiscan   MPI 10/16/2023:   The study is normal. The study is low risk.   No ST deviation was noted.   LV perfusion is normal. There is no evidence of ischemia. There is no evidence of infarction.   Left ventricular function is normal. Nuclear stress EF: 72%. End diastolic cavity size is normal. End systolic cavity size is normal.   No significant coronary calcifications noted. __________  Zio patch 09/2023: Patient had a min HR of 50 bpm, max HR of 185 bpm, and avg HR of 76 bpm. Predominant underlying rhythm was Sinus Rhythm. 3 Supraventricular Tachycardia runs occurred, the run with the fastest interval lasting 9 beats with a max rate of 185 bpm, the  longest lasting 11 beats with an avg rate of 151 bpm. Isolated SVEs were rare (<1.0%), SVE Couplets were rare (<1.0%), and SVE Triplets were rare (<1.0%). Isolated VEs were rare (<1.0%), VE Couplets were rare (<1.0%), and no VE Triplets were present.    Conclusion Average heart rate 76 bpm, range 50-185 bpm. 3 episodes of nonsustained SVT noted, longest lasting 11 beats. No atrial fibrillation or atrial flutter. No sustained arrhythmias. __________  01/14/2023: FINDINGS: Aorta: Normal size. Mild ascending aortic wall calcifications. No dissection.   Aortic Valve:  Trileaflet.  No calcifications.   Coronary Arteries:  Normal coronary origin.  Right dominance.   RCA is a dominant artery. There is no plaque.   Left main gives rise to LAD and LCX arteries. LM has no disease.   LAD has calcified plaque in the mid vessel causing minimal stenosis (<25%).   LCX is a non-dominant artery.  There is no plaque.   Other findings:   Normal pulmonary vein drainage into the left atrium.   Normal left atrial appendage without a thrombus.   Normal size of the pulmonary artery.   IMPRESSION: 1. Coronary calcium  score of 13.7. This was 88th percentile for age  and sex matched control. 2. Normal coronary origin with right dominance. 3. Minimal stenosis in  mid LAD (<25%). 4. CAD-RADS 1. Minimal non-obstructive CAD (0-24%). Consider preventive therapy and risk factor modification. __________   2D echo 12/15/2022: 1. Left ventricular ejection fraction, by estimation, is 55 to 60%. Left  ventricular ejection fraction by 2D MOD biplane is 56.8 %. The left  ventricle has normal function. The left ventricle has no regional wall  motion abnormalities. Left ventricular  diastolic parameters were normal. The average left ventricular global  longitudinal strain is -18.5 %. The global longitudinal strain is normal.   2. Right ventricular systolic function is normal. The right ventricular  size is normal.   3. The mitral valve is normal in structure. No evidence of mitral valve  regurgitation.   4. The aortic valve is tricuspid. Aortic valve regurgitation is not  visualized.   5. The inferior vena cava is normal in size with greater than 50%  respiratory variability, suggesting right atrial pressure of 3 mmHg.    EKG:  EKG is ordered today.  The EKG ordered today demonstrates NSR, 66 bpm, incomplete RBBB, consistent with prior tracing  Recent Labs: 07/15/2023: ALT 10 09/13/2023: Magnesium  2.1 09/15/2023: TSH 0.170 12/18/2023: BUN 7; Creatinine, Ser 0.53; Hemoglobin 14.0; Platelets 209; Potassium 3.9; Sodium 140  Recent Lipid Panel    Component Value Date/Time   CHOL 208 (H) 07/15/2023 1335   TRIG 84 07/15/2023 1335   HDL 52 07/15/2023 1335   CHOLHDL 4.0 07/15/2023 1335   LDLCALC 141 (H) 07/15/2023 1335    PHYSICAL EXAM:    VS:  BP 100/60 (BP Location: Left Arm, Patient Position: Sitting, Cuff Size: Normal)   Pulse 66   Ht 5' 3 (1.6 m)   Wt 178 lb 9.6 oz (81 kg)   SpO2 98%   BMI 31.64 kg/m   BMI: Body mass index is 31.64 kg/m.  Physical Exam Vitals reviewed.  Constitutional:      Appearance: She is well-developed.  HENT:     Head: Normocephalic and atraumatic.  Eyes:     General:        Right eye: No discharge.        Left eye: No  discharge.  Cardiovascular:     Rate and Rhythm: Normal rate and regular rhythm.     Heart sounds: Normal heart sounds, S1 normal and S2 normal. Heart sounds not distant. No midsystolic click and no opening snap. No murmur heard.    No friction rub.  Pulmonary:     Effort: Pulmonary effort is normal. No respiratory distress.     Breath sounds: Normal breath sounds. No decreased breath sounds, wheezing, rhonchi or rales.  Chest:     Chest wall: No tenderness.  Musculoskeletal:     Cervical back: Normal range of motion.     Right lower leg: No edema.     Left lower leg: No edema.  Skin:    General: Skin is warm and dry.     Nails: There is no clubbing.  Neurological:     Mental Status: She is alert and oriented to person, place, and time.  Psychiatric:        Speech: Speech normal.        Behavior: Behavior normal.        Thought Content: Thought content normal.        Judgment: Judgment normal.     Wt Readings from Last 3 Encounters:  12/22/23 178 lb 9.6  oz (81 kg)  12/18/23 172 lb (78 kg)  12/02/23 176 lb (79.8 kg)     ASSESSMENT & PLAN:   Nonobstructive CAD: No symptoms suggestive of angina or cardiac decompensation.  Recent coronary CTA without evidence of obstructive disease and Lexiscan  MPI without evidence of ischemia.  Recently evaluated in the ER with symptoms consistent with bronchitis with negative high-sensitivity troponin.  Continue aggressive risk factor modification and primary prevention including atorvastatin .  Do not anticipate further ischemic testing at this time given reassuring evaluation.  Palpitations/dyspnea: She reports an increase in palpitation burden associated dizziness.  Query if symptoms are exacerbated by levothyroxine  dosage with recommendation to obtain thyroid  function testing as outlined below.  Given reported change in palpitation burden with associated symptoms of dizziness, she would like to repeat Zio patch monitoring.  We will also obtain  an echo to evaluate for new cardiomyopathy.  HLD: LDL 141 in 07/2023.  Now on atorvastatin  80 mg and ezetimibe  10 mg.  Obtain lipid panel, and AST/ALT with recommendation to escalate lipid-lowering therapy as indicated to achieve target LDL less than 70.  May need to pursue PCSK9 inhibitor.  COPD/asthma with ongoing tobacco use: Currently finishing up treatment for bronchitis.  Smoking cessation is recommended.  Follow-up with PCP.  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  Most recent thyroid  function studies showed evidence of iatrogenic hyperthyroidism leading levothyroxine  to be reduced.  Query if this is contributing to her palpitation burden.  Check TSH and free T4 with ongoing management per PCP.     Disposition: F/u with Dr. Darliss or an APP in 6 months.   Medication Adjustments/Labs and Tests Ordered: Current medicines are reviewed at length with the patient today.  Concerns regarding medicines are outlined above. Medication changes, Labs and Tests ordered today are summarized above and listed in the Patient Instructions accessible in Encounters.   Signed, Bernardino Bring, PA-C 12/22/2023 4:29 PM     [facility] [place] [address] [place], [place] [address] [phone]

## 2023-12-21 NOTE — Telephone Encounter (Signed)
 Requested Prescriptions  Refused Prescriptions Disp Refills   albuterol  (PROVENTIL ) (2.5 MG/3ML) 0.083% nebulizer solution 75 mL 12    Sig: Take 3 mLs (2.5 mg total) by nebulization every 6 (six) hours as needed for wheezing or shortness of breath.     Pulmonology:  Beta Agonists 2 Passed - 12/21/2023 12:38 PM      Passed - Last BP in normal range    BP Readings from Last 1 Encounters:  12/18/23 108/73         Passed - Last Heart Rate in normal range    Pulse Readings from Last 1 Encounters:  12/18/23 72         Passed - Valid encounter within last 12 months    Recent Outpatient Visits           2 weeks ago Acute non-recurrent frontal sinusitis   [place] [facility] Melvin Pao, NP   2 months ago Appointment canceled by hospital   [facility] Melvin Pao, NP   3 months ago Acquired hypothyroidism   [place] [facility] Melvin Pao, NP   5 months ago Advanced care planning/counseling discussion   [place] [facility] Melvin Pao, NP       Future Appointments             Tomorrow Dunn, Bernardino HERO, PA-C [facility] [place]   In 2 weeks Melvin Pao, NP [facility]

## 2023-12-22 ENCOUNTER — Ambulatory Visit: Attending: Physician Assistant | Admitting: Physician Assistant

## 2023-12-22 ENCOUNTER — Ambulatory Visit

## 2023-12-22 ENCOUNTER — Encounter: Payer: Self-pay | Admitting: Physician Assistant

## 2023-12-22 VITALS — BP 100/60 | HR 66 | Ht 63.0 in | Wt 178.6 lb

## 2023-12-22 DIAGNOSIS — R002 Palpitations: Secondary | ICD-10-CM | POA: Diagnosis not present

## 2023-12-22 DIAGNOSIS — E785 Hyperlipidemia, unspecified: Secondary | ICD-10-CM | POA: Diagnosis not present

## 2023-12-22 DIAGNOSIS — Z79899 Other long term (current) drug therapy: Secondary | ICD-10-CM | POA: Diagnosis not present

## 2023-12-22 DIAGNOSIS — R946 Abnormal results of thyroid function studies: Secondary | ICD-10-CM | POA: Diagnosis not present

## 2023-12-22 DIAGNOSIS — J449 Chronic obstructive pulmonary disease, unspecified: Secondary | ICD-10-CM | POA: Diagnosis not present

## 2023-12-22 DIAGNOSIS — I2510 Atherosclerotic heart disease of native coronary artery without angina pectoris: Secondary | ICD-10-CM

## 2023-12-22 DIAGNOSIS — E7800 Pure hypercholesterolemia, unspecified: Secondary | ICD-10-CM | POA: Diagnosis not present

## 2023-12-22 DIAGNOSIS — R0602 Shortness of breath: Secondary | ICD-10-CM | POA: Diagnosis not present

## 2023-12-22 DIAGNOSIS — Z720 Tobacco use: Secondary | ICD-10-CM | POA: Diagnosis not present

## 2023-12-22 NOTE — Patient Instructions (Signed)
 Medication Instructions:  Your physician recommends that you continue on your current medications as directed. Please refer to the Current Medication list given to you today.   *If you need a refill on your cardiac medications before your next appointment, please call your pharmacy*  Lab Work: Your provider would like for you to have following labs drawn today AST, ALT, Lipids, TSH, and Free T4.   If you have labs (blood work) drawn today and your tests are completely normal, you will receive your results only by: MyChart Message (if you have MyChart) OR A paper copy in the mail If you have any lab test that is abnormal or we need to change your treatment, we will call you to review the results.  Testing/Procedures: Your physician has requested that you have an echocardiogram. Echocardiography is a painless test that uses sound waves to create images of your heart. It provides your doctor with information about the size and shape of your heart and how well your heart's chambers and valves are working.   You may receive an ultrasound enhancing agent through an IV if needed to better visualize your heart during the echo. This procedure takes approximately one hour.  There are no restrictions for this procedure.  This will take place at [address] [facility] [address], [place] [address]  Please note: We ask at that you not bring children with you during ultrasound (echo/ vascular) testing. Due to room size and safety concerns, children are not allowed in the ultrasound rooms during exams. Our front office staff cannot provide observation of children in our lobby area while testing is being conducted. An adult accompanying a patient to their appointment will only be allowed in the ultrasound room at the discretion of the ultrasound technician under special circumstances. We apologize for any inconvenience.  Your physician has recommended that you wear a Zio monitor.   This monitor  is a medical device that records the heart's electrical activity. Doctors most often use these monitors to diagnose arrhythmias. Arrhythmias are problems with the speed or rhythm of the heartbeat. The monitor is a small device applied to your chest. You can wear one while you do your normal daily activities. While wearing this monitor if you have any symptoms to push the button and record what you felt. Once you have worn this monitor for the period of time provider prescribed (Usually 14 days), you will return the monitor device in the postage paid box. Once it is returned they will download the data collected and provide us  with a report which the provider will then review and we will call you with those results. Important tips:  Avoid showering during the first 24 hours of wearing the monitor. Avoid excessive sweating to help maximize wear time. Do not submerge the device, no hot tubs, and no swimming pools. Keep any lotions or oils away from the patch. After 24 hours you may shower with the patch on. Take brief showers with your back facing the shower head.  Do not remove patch once it has been placed because that will interrupt data and decrease adhesive wear time. Push the button when you have any symptoms and write down what you were feeling. Once you have completed wearing your monitor, remove and place into box which has postage paid and place in your outgoing mailbox.  If for some reason you have misplaced your box then call our office and we can provide another box and/or mail it off for  you.   Follow-Up: At [facility], you and your health needs are our priority.  As part of our continuing mission to provide you with exceptional heart care, our providers are all part of one team.  This team includes your primary Cardiologist (physician) and Advanced Practice Providers or APPs (Physician Assistants and Nurse Practitioners) who all work together to provide you with the care you need,  when you need it.  Your next appointment:   6 month(s)  Provider:   You may see Redell Cave, MD or Bernardino Bring, PA-C

## 2023-12-23 LAB — T4, FREE: Free T4: 1.53 ng/dL (ref 0.82–1.77)

## 2023-12-23 LAB — AST: AST: 65 IU/L — ABNORMAL HIGH (ref 0–40)

## 2023-12-23 LAB — LIPID PANEL
Chol/HDL Ratio: 3 ratio (ref 0.0–4.4)
Cholesterol, Total: 181 mg/dL (ref 100–199)
HDL: 60 mg/dL (ref >39)
LDL Chol Calc (NIH): 103 mg/dL — ABNORMAL HIGH (ref 0–99)
Triglycerides: 97 mg/dL (ref 0–149)
VLDL Cholesterol Cal: 18 mg/dL (ref 5–40)

## 2023-12-23 LAB — ALT: ALT: 51 IU/L — ABNORMAL HIGH (ref 0–32)

## 2023-12-23 LAB — TSH: TSH: 0.615 u[IU]/mL (ref 0.450–4.500)

## 2023-12-24 ENCOUNTER — Ambulatory Visit: Payer: Self-pay | Admitting: Physician Assistant

## 2023-12-24 DIAGNOSIS — Z79899 Other long term (current) drug therapy: Secondary | ICD-10-CM

## 2023-12-29 ENCOUNTER — Inpatient Hospital Stay: Admitting: Nurse Practitioner

## 2024-01-01 ENCOUNTER — Ambulatory Visit: Admitting: Nurse Practitioner

## 2024-01-01 ENCOUNTER — Encounter: Payer: Self-pay | Admitting: Nurse Practitioner

## 2024-01-01 VITALS — BP 110/72 | HR 70 | Temp 97.1°F | Ht 63.0 in | Wt 177.4 lb

## 2024-01-01 DIAGNOSIS — F17210 Nicotine dependence, cigarettes, uncomplicated: Secondary | ICD-10-CM

## 2024-01-01 DIAGNOSIS — J439 Emphysema, unspecified: Secondary | ICD-10-CM | POA: Diagnosis not present

## 2024-01-01 DIAGNOSIS — J449 Chronic obstructive pulmonary disease, unspecified: Secondary | ICD-10-CM

## 2024-01-01 DIAGNOSIS — Z720 Tobacco use: Secondary | ICD-10-CM

## 2024-01-01 NOTE — Progress Notes (Signed)
 @Patient  ID: Janet Murray, female    DOB: 1971/05/01, 53 y.o.   MRN: [id number]  Chief Complaint  Patient presents with   Follow-up    No SOB. Wheezing. Cough with clear sputum.    Referring provider: Melvin Pao, NP  HPI: 53 year old female, active smoker followed for COPD with chronic bronchitis and emphysema. She is a patient of Dr. Tamea and last seen in office 11/11/2022. Past medical history significant for CAD, migraines, fatty liver, thyroid  cancer, RLS, anxiety.   TEST/EVENTS:  10/29/2017 PFTs: FVC 41%, FEV1 27%, ratio 56%.  Significant bronchodilator response of 58%.  Diffusion capacity moderately reduced 03/14/2021 PFTs: FVC 58%, FEV1 50%, ratio 68%, diffusing capacity normal.  No BD. 10/02/2022 PFTs: FVC 55%, FEV1 48%, ratio 71%. Positive BD of 28%. TLC 122%, DLCO is normal.   08/07/2022: OV with Dr. Tamea. Intolerant of ICS in past. No change in character of cough or dyspnea. Occasional wheeze which responds to albuterol .   11/11/2022: OV with Delancey Moraes NP for follow up. She had PFTs since she was here last that revealed moderate to severe obstruction with reversibility. Overall, these were stable compared to her previous testing. She was started on Bevespi  at her last visit. She feels like this is working well for her. She feels like her stamina has improved and she's not noticing that her breathing is as short. She does still have a morning productive cough but tends to resolve after this. She denies any fevers, chills, night sweats, hemoptysis, anorexia, weight loss, leg swelling. Using albuterol  once a day, which is an improvement. She's still smoking. Working on cutting back.   01/01/2024: Today - follow up Discussed the use of AI scribe software for clinical note transcription with the patient, who gave verbal consent to proceed.  History of Present Illness Janet Murray is a 53 year old female who presents for follow up.  She recently visited the emergency department  earlier this month due to a cough and chest discomfort. Workup unremarkable. She was treated with prednisone  and improved. She continues to experience some drainage and mucus production, which is clear and occurs primarily in the morning. This is baseline for her.   She's only using her Bevespi  once a day. She also uses a rescue inhaler two puffs in the morning, around lunch, and again in the evening. She reports improvement in her breathing.  She has a smoking history of half a pack per day for 25 years, and is still smoking. Does not qualify for the lung cancer screening program.   No unintentional weight loss, maintains a good appetite, and no hemoptysis. Rare wheezing. She experiences shortness of breath primarily when walking longer distances, both on flat ground and uphill.  Her past medical history includes thyroid  cancer, for which she had her thyroid  removed.    Allergies  Allergen Reactions   Penicillins Hives and Swelling    Has tolerated multiple 1st & 3rd generation (CEFAZOLIN , CEPHALEXIN , CEFTRIAXONE , CEFDINIR ) cephalosporins in the past with no documented ADRs.    Acetaminophen      Due to polyps    Ciprofloxacin  Itching   Aspirin Palpitations   Hydrocodone  Hives and Rash   Tape Rash    Per patient clear tape    Immunization History  Administered Date(s) Administered   Td 07/10/2013    Past Medical History:  Diagnosis Date   Anginal pain (HCC)    Anxiety    Arthritis    Asthma    Biliary  colic    Carpal tunnel syndrome of right wrist    Cholelithiasis    Chronic pain    Colon cancer (HCC)    COPD (chronic obstructive pulmonary disease) (HCC)    Coronary artery disease    Cyst of right kidney 03/22/2018   DDD (degenerative disc disease), lumbar    Degenerative joint disease (DJD) of hip    Diarrhea of infectious origin 03/25/2018   a.) culture (+) for campylobacter species   Dysphagia    Dyspnea    Emphysema of lung (HCC)    Facet syndrome, lumbar  10/25/2014   Fatty liver 03/25/2018   Follicular thyroid  carcinoma (HCC)    a.) stage I --> s/p total thyroidectomy   Foot fracture, left 05/02/2015   Ganglion cyst of dorsum of right wrist    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   GERD (gastroesophageal reflux disease)    Hereditary and idiopathic peripheral neuropathy 11/06/2014   HLD (hyperlipidemia)    Iatrogenic hypothyroidism    a.) s/p radioactive idodine and subsequent total thyroidectomy for stage I follicular thyroid  carcinoma   IBS (irritable bowel syndrome)    Incomplete RBBB    Long term prescription opiate use    a.) managed by PCP; has naloxone Rx available   Low back pain    Migraine 02/04/2016   Occipital neuralgia    Osteoarthritis of spine with radiculopathy, lumbar region 10/25/2014   RLS (restless legs syndrome)    a.) takes pramipexole    Sacroiliac joint dysfunction of both sides 10/25/2014   Spondylosis of lumbar spine    Stomach cancer (HCC)    Tobacco use    Tubular adenoma of colon    Urinary incontinence     Tobacco History: Social History   Tobacco Use  Smoking Status Every Day   Current packs/day: 0.50   Average packs/day: 0.5 packs/day for 25.0 years (12.5 ttl pk-yrs)   Types: Cigarettes  Smokeless Tobacco Never  Tobacco Comments   0.5PPD - 01/01/2024 khj    Ready to quit: Not Answered Counseling given: Not Answered Tobacco comments: 0.5PPD - 01/01/2024 khj    Outpatient Medications Prior to Visit  Medication Sig Dispense Refill   albuterol  (PROVENTIL ) (2.5 MG/3ML) 0.083% nebulizer solution Take 3 mLs (2.5 mg total) by nebulization every 6 (six) hours as needed for wheezing or shortness of breath. 75 mL 12   AMBULATORY NON FORMULARY MEDICATION Medication Name: nebulizer DX: J44.9 1 each 0   atorvastatin  (LIPITOR) 80 MG tablet TAKE 1 TABLET BY MOUTH DAILY 90 tablet 3   Cholecalciferol (VITAMIN D3) 125 MCG (5000 UT) CAPS Take 1 capsule (5,000 Units total) by mouth daily. 90 capsule 1    diclofenac Sodium (VOLTAREN) 1 % GEL Apply 1 application topically daily as needed (Knee pain).     esomeprazole  (NEXIUM ) 40 MG capsule TAKE 1 CAPSULE BY MOUTH TWICE DAILY 180 capsule 1   Glycopyrrolate -Formoterol  (BEVESPI  AEROSPHERE) 9-4.8 MCG/ACT AERO USE 2 PUFFS BY MOUTH TWICE DAILY 10.7 g 3   hydrOXYzine  (VISTARIL ) 25 MG capsule Take 1 capsule (25 mg total) by mouth every 8 (eight) hours as needed. 30 capsule 0   levothyroxine  (SYNTHROID ) 175 MCG tablet Take 1 tablet (175 mcg total) by mouth daily. 90 tablet 1   mirabegron  ER (MYRBETRIQ ) 50 MG TB24 tablet Take 1 tablet (50 mg total) by mouth daily. (Patient taking differently: Take 50 mg by mouth daily as needed (overactive bladder).) 30 tablet 5   montelukast  (SINGULAIR ) 10 MG tablet Take 1 tablet (10 mg  total) by mouth at bedtime. 90 tablet 1   naloxone (NARCAN) nasal spray 4 mg/0.1 mL Place 0.4 mg into the nose once.     ondansetron  (ZOFRAN -ODT) 4 MG disintegrating tablet TAKE 1 TABLET BY MOUTH EVERY 8 HOURS AS NEEDED 30 tablet 0   Oxycodone  HCl 10 MG TABS Take 10 mg by mouth 3 (three) times daily as needed.     pramipexole  (MIRAPEX ) 0.5 MG tablet TAKE 1 TABLET BY MOUTH 3 TIMES DAILY 270 tablet 0   tirzepatide  (MOUNJARO ) 5 MG/0.5ML Pen Inject 5 mg into the skin once a week. 6 mL 1   tiZANidine  (ZANAFLEX ) 2 MG tablet Take 2 mg by mouth 3 (three) times daily.     albuterol  (VENTOLIN  HFA) 108 (90 Base) MCG/ACT inhaler INHALE 2 PUFFS INTO THE LUNGS EVERY 6 HOURS AS NEEDED FOR WHEEZING OR SHORTNESS OF BREATH 8.5 g 1   doxycycline  (VIBRA -TABS) 100 MG tablet Take 1 tablet (100 mg total) by mouth 2 (two) times daily. 20 tablet 0   methylPREDNISolone  (MEDROL  DOSEPAK) 4 MG TBPK tablet Take as directed 21 tablet 0   predniSONE  (STERAPRED UNI-PAK 21 TAB) 10 MG (21) TBPK tablet Take as directed 21 tablet 0   albuterol  (VENTOLIN  HFA) 108 (90 Base) MCG/ACT inhaler Inhale 2 puffs into the lungs every 4 (four) hours as needed for wheezing or shortness of  breath. (Patient not taking: Reported on 01/01/2024) 1 each 0   No facility-administered medications prior to visit.     Review of Systems:   Constitutional: No weight loss or gain, night sweats, fevers, chills, fatigue, or lassitude. HEENT: No headaches, difficulty swallowing, tooth/dental problems, or sore throat. No sneezing, itching, ear ache, nasal congestion, or post nasal drip CV:  No chest pain, orthopnea, PND, swelling in lower extremities, anasarca, dizziness, palpitations, syncope Resp: +shortness of breath with exertion; chronic cough; rare wheeze. No excess mucus or change in color of mucus. No chest wall deformity GI:  No heartburn, indigestion, abdominal pain, nausea, vomiting, diarrhea, change in bowel habits, loss of appetite, bloody stools.   MSK:  No increased joint pain or swelling.   Psych: No increased depression or anxiety. Mood stable.     Physical Exam:  BP 110/72 (BP Location: Right Arm, Cuff Size: Normal)   Pulse 70   Temp (!) 97.1 F (36.2 C)   Ht 5' 3 (1.6 m)   Wt 177 lb 6.4 oz (80.5 kg)   SpO2 98%   BMI 31.42 kg/m   GEN: Pleasant, interactive, well-appearing; obese; in no acute distress HEENT:  Normocephalic and atraumatic. PERRLA. Sclera white. Nasal turbinates pink, moist and patent bilaterally. No rhinorrhea present. Oropharynx pink and moist, without exudate or edema. No lesions, ulcerations, or postnasal drip.  NECK:  Supple w/ fair ROM. No JVD present. Normal carotid impulses w/o bruits. Absent thyroid . No lymphadenopathy.   CV: RRR, no m/r/g, no peripheral edema. Pulses intact, +2 bilaterally. No cyanosis, pallor or clubbing. PULMONARY:  Unlabored, regular breathing. Diminished bibasilar airflow otherwise clear bilaterally A&P w/o wheezes/rales/rhonchi. No accessory muscle use.  GI: BS present and normoactive. Soft, non-tender to palpation. No organomegaly or masses detected.  MSK: No erythema, warmth or tenderness. Cap refil <2 sec all extrem.  No deformities or joint swelling noted.  Neuro: A/Ox3. No focal deficits noted.   Skin: Warm, no lesions or rashe Psych: Normal affect and behavior. Judgement and thought content appropriate.     Lab Results:  CBC    Component Value Date/Time  WBC 6.4 12/18/2023 0139   RBC 4.16 12/18/2023 0139   HGB 14.0 12/18/2023 0139   HGB 13.3 06/30/2023 1146   HCT 40.6 12/18/2023 0139   HCT 39.8 06/30/2023 1146   PLT 209 12/18/2023 0139   PLT CANCELED 06/30/2023 1146   MCV 97.6 12/18/2023 0139   MCV 103 (H) 06/30/2023 1146   MCV 103 (H) 03/15/2014 1716   MCH 33.7 12/18/2023 0139   MCHC 34.5 12/18/2023 0139   RDW 12.8 12/18/2023 0139   RDW 12.1 06/30/2023 1146   RDW 13.0 03/15/2014 1716   LYMPHSABS 2.2 06/30/2023 1146   LYMPHSABS 2.9 10/29/2013 1812   MONOABS 0.8 11/30/2014 2322   MONOABS 0.7 10/29/2013 1812   EOSABS 0.2 06/30/2023 1146   EOSABS 0.2 10/29/2013 1812   BASOSABS 0.1 06/30/2023 1146   BASOSABS 0.1 10/29/2013 1812    BMET    Component Value Date/Time   NA 140 12/18/2023 0139   NA 138 07/15/2023 1335   NA 141 03/15/2014 1609   K 3.9 12/18/2023 0139   K 3.5 03/15/2014 1609   CL 104 12/18/2023 0139   CL 109 (H) 03/15/2014 1609   CO2 24 12/18/2023 0139   CO2 27 03/15/2014 1609   GLUCOSE 114 (H) 12/18/2023 0139   GLUCOSE 88 03/15/2014 1609   BUN 7 12/18/2023 0139   BUN 7 07/15/2023 1335   BUN 6 (L) 03/15/2014 1609   CREATININE 0.53 12/18/2023 0139   CREATININE 0.52 (L) 03/15/2014 1609   CALCIUM  9.3 12/18/2023 0139   CALCIUM  8.2 (L) 03/15/2014 1609   GFRNONAA >60 12/18/2023 0139   GFRNONAA >60 03/15/2014 1609   GFRNONAA >60 12/16/2013 2140   GFRAA 127 01/19/2020 0929   GFRAA >60 03/15/2014 1609   GFRAA >60 12/16/2013 2140    BNP    Component Value Date/Time   BNP 20 12/16/2013 2140     Imaging:  DG Chest 2 View Result Date: 12/18/2023 CLINICAL DATA:  Chest tightness. Difficulty breathing due to chest tightness. Pain radiates to the right side.  EXAM: CHEST - 2 VIEW COMPARISON:  09/13/2023 FINDINGS: The heart size and mediastinal contours are within normal limits. Both lungs are clear. The visualized skeletal structures are unremarkable. IMPRESSION: No active cardiopulmonary disease. Electronically Signed   By: Elsie Gravely M.D.   On: 12/18/2023 02:08    Administration History     None          Latest Ref Rng & Units 10/02/2022    4:34 PM  PFT Results  FVC-Pre L 1.76   FVC-Predicted Pre % 55   FVC-Post L 2.17   FVC-Predicted Post % 69   Pre FEV1/FVC % % 68   Post FEV1/FCV % % 71   FEV1-Pre L 1.20   FEV1-Predicted Pre % 48   FEV1-Post L 1.54   DLCO uncorrected ml/min/mmHg 18.05   DLCO UNC% % 95   DLVA Predicted % 96   TLC L 5.65   TLC % Predicted % 122   RV % Predicted % 192     Lab Results  Component Value Date   NITRICOXIDE 5 08/07/2022        Assessment & Plan:   COPD (chronic obstructive pulmonary disease) with emphysema (HCC) Recent mild exacerbation, resolved. Advised to increase use of Bevespi  Twice daily. May need to consider addition of ICS. Trigger prevention reviewed. Action plan in place. Encouraged to remain active.  Patient Instructions  Continue Albuterol  inhaler 2 puffs or 3 mL neb every 6 hours  as needed for shortness of breath or wheezing. Notify if symptoms persist despite rescue inhaler/neb use.  Increase Bevespi  to 2 puffs twice daily  Continue singulair  1 tab daily    Continue to work on quitting smoking   CT chest ordered    Follow up in 6 months with Dr. Tamea (1st) or [facility]. If symptoms worsen, please contact office for sooner follow up or seek emergency care.    Tobacco abuse Active smoker. Smoking cessation advised. Not a candidate for lung cancer screening program due to pack year history <20 pack years. Will obtain annual CT chest for monitoring. Ordered today.   I spent 35 minutes of dedicated to the care of this patient on the date of this encounter to include  pre-visit review of records, face-to-face time with the patient discussing conditions above, post visit ordering of testing, clinical documentation with the electronic health record, making appropriate referrals as documented, and communicating necessary findings to members of the patients care team.  Comer LULLA Rouleau, NP 01/01/2024  Pt aware and understands NP's role.

## 2024-01-01 NOTE — Assessment & Plan Note (Signed)
 Active smoker. Smoking cessation advised. Not a candidate for lung cancer screening program due to pack year history <20 pack years. Will obtain annual CT chest for monitoring. Ordered today.

## 2024-01-01 NOTE — Assessment & Plan Note (Signed)
 Recent mild exacerbation, resolved. Advised to increase use of Bevespi  Twice daily. May need to consider addition of ICS. Trigger prevention reviewed. Action plan in place. Encouraged to remain active.  Patient Instructions  Continue Albuterol  inhaler 2 puffs or 3 mL neb every 6 hours as needed for shortness of breath or wheezing. Notify if symptoms persist despite rescue inhaler/neb use.  Increase Bevespi  to 2 puffs twice daily  Continue singulair  1 tab daily    Continue to work on quitting smoking   CT chest ordered    Follow up in 6 months with Dr. Tamea (1st) or [facility]. If symptoms worsen, please contact office for sooner follow up or seek emergency care.

## 2024-01-01 NOTE — Patient Instructions (Addendum)
 Continue Albuterol  inhaler 2 puffs or 3 mL neb every 6 hours as needed for shortness of breath or wheezing. Notify if symptoms persist despite rescue inhaler/neb use.  Increase Bevespi  to 2 puffs twice daily  Continue singulair  1 tab daily    Continue to work on quitting smoking   CT chest ordered    Follow up in 6 months with Dr. Tamea (1st) or [facility]. If symptoms worsen, please contact office for sooner follow up or seek emergency care.

## 2024-01-05 ENCOUNTER — Encounter: Payer: Self-pay | Admitting: Nurse Practitioner

## 2024-01-05 ENCOUNTER — Ambulatory Visit (INDEPENDENT_AMBULATORY_CARE_PROVIDER_SITE_OTHER): Payer: Self-pay | Admitting: Nurse Practitioner

## 2024-01-05 VITALS — BP 103/64 | HR 67 | Temp 98.9°F | Ht 63.0 in | Wt 177.4 lb

## 2024-01-05 DIAGNOSIS — I700 Atherosclerosis of aorta: Secondary | ICD-10-CM

## 2024-01-05 DIAGNOSIS — E6609 Other obesity due to excess calories: Secondary | ICD-10-CM

## 2024-01-05 DIAGNOSIS — E66812 Obesity, class 2: Secondary | ICD-10-CM | POA: Diagnosis not present

## 2024-01-05 DIAGNOSIS — R002 Palpitations: Secondary | ICD-10-CM | POA: Diagnosis not present

## 2024-01-05 DIAGNOSIS — J439 Emphysema, unspecified: Secondary | ICD-10-CM

## 2024-01-05 DIAGNOSIS — E039 Hypothyroidism, unspecified: Secondary | ICD-10-CM | POA: Diagnosis not present

## 2024-01-05 DIAGNOSIS — Z6836 Body mass index (BMI) 36.0-36.9, adult: Secondary | ICD-10-CM

## 2024-01-05 DIAGNOSIS — R490 Dysphonia: Secondary | ICD-10-CM

## 2024-01-05 DIAGNOSIS — D696 Thrombocytopenia, unspecified: Secondary | ICD-10-CM | POA: Diagnosis not present

## 2024-01-05 NOTE — Assessment & Plan Note (Signed)
 Chronic.  Will rule out abnormal thyroid labs as cause of symptoms.  Will make recommendations based on results.

## 2024-01-05 NOTE — Assessment & Plan Note (Signed)
 Continue with mounjaro  for weight loss.

## 2024-01-05 NOTE — Assessment & Plan Note (Signed)
 Chronic.  Followed by Pulmonology.  Reviewed recent note.  Getting CT done.  Continue with Bevespi .  Continue to follow up with specialist.

## 2024-01-05 NOTE — Assessment & Plan Note (Signed)
Chronic, historic condition Appears controlled on current regimen comprised of Atorvastatin 80 mg PO every day  Continue current regimen Recheck lipid panel today- results to dictate further management Follow up in 6 months or sooner if concerns arise

## 2024-01-05 NOTE — Progress Notes (Signed)
 BP 103/64   Pulse 67   Temp 98.9 F (37.2 C) (Oral)   Ht 5' 3 (1.6 m)   Wt 177 lb 6.4 oz (80.5 kg)   SpO2 94%   BMI 31.42 kg/m    Subjective:    Patient ID: Janet Murray, female    DOB: [birth year], 53 y.o.   MRN: [id number]  HPI: Janet Murray is a 53 y.o. female  Chief Complaint  Patient presents with   Hypothyroidism   Obesity   COPD Using Bevspi BID.  Only using albuterol  PRN.  Working to get CT Lung screening scheduled. COPD status: stable Satisfied with current treatment?: yes Oxygen use: no Dyspnea frequency: not often  Cough frequency: very infrequent  Rescue inhaler frequency:  about 1-2 times per week  Limitation of activity:  does not feel limited very much  Productive cough:  Last Spirometry:  Pneumovax: unknown Influenza: Up to Date   HYPERLIPIDEMIA/ CAD  She is currently wearing a heart monitor.  States her liver enzymes were elevated and she stopped her Zetia .  Hyperlipidemia status: good compliance Satisfied with current treatment?  yes Side effects:  no Medication compliance: good compliance Past cholesterol meds: atorvastain (lipitor) Supplements: none Aspirin:  no The 10-year ASCVD risk score (Arnett DK, et al., 2019) is: 4.6%   Values used to calculate the score:     Age: 69 years     Sex: Female     Is Non-Hispanic African American: No     Diabetic: Yes     Tobacco smoker: Yes     Systolic Blood Pressure: 94 mmHg     Is BP treated: No     HDL Cholesterol: 56 mg/dL     Total Cholesterol: 220 mg/dL Chest pain:  no Coronary artery disease:  yes   HYPOTHYROIDISM Thyroid  control status:stable Satisfied with current treatment? yes Medication side effects: no Medication compliance: good compliance Etiology of hypothyroidism:  Recent dose adjustment:no Fatigue: no Cold intolerance: no Heat intolerance: no Weight gain: yes Weight loss: no Constipation: no Diarrhea/loose stools:  no Palpitations: yes- going on for the last couple  of days. Lower extremity edema: no Anxiety/depressed mood: no  Patient states she is having a horse voice.  It comes and goes.  No difficulty swallowing or sore throat.    Relevant past medical, surgical, family and social history reviewed and updated as indicated. Interim medical history since our last visit reviewed. Allergies and medications reviewed and updated.  Review of Systems  Eyes:  Negative for visual disturbance.  Respiratory:  Positive for shortness of breath. Negative for cough and chest tightness.   Cardiovascular:  Negative for chest pain, palpitations and leg swelling.  Neurological:  Negative for dizziness and headaches.    Per HPI unless specifically indicated above     Objective:    BP 103/64   Pulse 67   Temp 98.9 F (37.2 C) (Oral)   Ht 5' 3 (1.6 m)   Wt 177 lb 6.4 oz (80.5 kg)   SpO2 94%   BMI 31.42 kg/m   Wt Readings from Last 3 Encounters:  01/05/24 177 lb 6.4 oz (80.5 kg)  01/01/24 177 lb 6.4 oz (80.5 kg)  12/22/23 178 lb 9.6 oz (81 kg)    Physical Exam Vitals and nursing note reviewed.  Constitutional:      General: She is not in acute distress.    Appearance: Normal appearance. She is normal weight. She is not ill-appearing, toxic-appearing or diaphoretic.  HENT:     Head: Normocephalic.     Right Ear: External ear normal.     Left Ear: External ear normal.     Nose: Nose normal.     Mouth/Throat:     Mouth: Mucous membranes are moist.     Pharynx: Oropharynx is clear.  Eyes:     General:        Right eye: No discharge.        Left eye: No discharge.     Extraocular Movements: Extraocular movements intact.     Conjunctiva/sclera: Conjunctivae normal.     Pupils: Pupils are equal, round, and reactive to light.  Cardiovascular:     Rate and Rhythm: Normal rate and regular rhythm.     Heart sounds: No murmur heard. Pulmonary:     Effort: Pulmonary effort is normal. No respiratory distress.     Breath sounds: Wheezing present. No  rales.  Musculoskeletal:     Cervical back: Normal range of motion and neck supple.  Skin:    General: Skin is warm and dry.     Capillary Refill: Capillary refill takes less than 2 seconds.  Neurological:     General: No focal deficit present.     Mental Status: She is alert and oriented to person, place, and time. Mental status is at baseline.  Psychiatric:        Mood and Affect: Mood normal.        Behavior: Behavior normal.        Thought Content: Thought content normal.        Judgment: Judgment normal.     Results for orders placed or performed in visit on 12/22/23  ALT   Collection Time: 12/22/23  3:41 PM  Result Value Ref Range   ALT 51 (H) 0 - 32 IU/L  AST   Collection Time: 12/22/23  3:41 PM  Result Value Ref Range   AST 65 (H) 0 - 40 IU/L  Lipid panel   Collection Time: 12/22/23  3:41 PM  Result Value Ref Range   Cholesterol, Total 181 100 - 199 mg/dL   Triglycerides 97 0 - 149 mg/dL   HDL 60 >60 mg/dL   VLDL Cholesterol Cal 18 5 - 40 mg/dL   LDL Chol Calc (NIH) 896 (H) 0 - 99 mg/dL   Chol/HDL Ratio 3.0 0.0 - 4.4 ratio  T4, free   Collection Time: 12/22/23  3:41 PM  Result Value Ref Range   Free T4 1.53 0.82 - 1.77 ng/dL  TSH   Collection Time: 12/22/23  3:41 PM  Result Value Ref Range   TSH 0.615 0.450 - 4.500 uIU/mL      Assessment & Plan:   Problem List Items Addressed This Visit       Cardiovascular and Mediastinum   Atherosclerosis of aorta (HCC)   Chronic, historic condition Appears controlled on current regimen comprised of Atorvastatin  80 mg PO every day  Continue current regimen Recheck lipid panel today- results to dictate further management Follow up in 6 months or sooner if concerns arise       Relevant Orders   Comprehensive metabolic panel with GFR   Lipid panel     Respiratory   COPD (chronic obstructive pulmonary disease) with emphysema (HCC)   Chronic.  Followed by Pulmonology.  Reviewed recent note.  Getting CT done.   Continue with Bevespi .  Continue to follow up with specialist.       Relevant Orders   Comprehensive  metabolic panel with GFR     Endocrine   Acquired hypothyroidism   Chronic.  Will rule out abnormal thyroid  labs as cause of symptoms.  Will make recommendations based on results.      Relevant Orders   TSH   T4     Hematopoietic and Hemostatic   Thrombocytopenia (HCC) - Primary   Chronic.  Controlled.  Continue with current medication regimen.  Labs ordered today.  Return to clinic in 6 months for reevaluation.  Call sooner if concerns arise.        Relevant Orders   CBC With Diff/Platelet     Other   Obesity   Continue with mounjaro  for weight loss.       Relevant Orders   Comprehensive metabolic panel with GFR   Other Visit Diagnoses       Hoarseness       Referral placed for Cardiology.   Relevant Orders   Ambulatory referral to ENT     Palpitations       Currently has a heart monitor on. ECHO scheduled for August. Recheck LFTs to ensure returning to baseline. Stopped Zetia . May need alternative. Note reviewed.        Follow up plan: Return in about 6 months (around 07/07/2024) for HTN, HLD, DM2 FU.

## 2024-01-05 NOTE — Assessment & Plan Note (Signed)
 Chronic.  Controlled.  Continue with current medication regimen.  Labs ordered today.  Return to clinic in 6 months for reevaluation.  Call sooner if concerns arise.  ? ?

## 2024-01-06 ENCOUNTER — Ambulatory Visit: Payer: Self-pay | Admitting: Nurse Practitioner

## 2024-01-06 LAB — COMPREHENSIVE METABOLIC PANEL WITH GFR
ALT: 18 IU/L (ref 0–32)
AST: 13 IU/L (ref 0–40)
Albumin: 4.3 g/dL (ref 3.8–4.9)
Alkaline Phosphatase: 112 IU/L (ref 44–121)
BUN/Creatinine Ratio: 11 (ref 9–23)
BUN: 6 mg/dL (ref 6–24)
Bilirubin Total: 0.3 mg/dL (ref 0.0–1.2)
CO2: 20 mmol/L (ref 20–29)
Calcium: 9.3 mg/dL (ref 8.7–10.2)
Chloride: 103 mmol/L (ref 96–106)
Creatinine, Ser: 0.57 mg/dL (ref 0.57–1.00)
Globulin, Total: 1.9 g/dL (ref 1.5–4.5)
Glucose: 92 mg/dL (ref 70–99)
Potassium: 4 mmol/L (ref 3.5–5.2)
Sodium: 139 mmol/L (ref 134–144)
Total Protein: 6.2 g/dL (ref 6.0–8.5)
eGFR: 109 mL/min/1.73 (ref >59)

## 2024-01-06 LAB — CBC WITH DIFF/PLATELET
Basophils Absolute: 0.1 x10E3/uL (ref 0.0–0.2)
Basos: 1 %
EOS (ABSOLUTE): 0.1 x10E3/uL (ref 0.0–0.4)
Eos: 1 %
Hematocrit: 40.6 % (ref 34.0–46.6)
Hemoglobin: 13.4 g/dL (ref 11.1–15.9)
Immature Grans (Abs): 0 x10E3/uL (ref 0.0–0.1)
Immature Granulocytes: 0 %
Lymphocytes Absolute: 2.2 x10E3/uL (ref 0.7–3.1)
Lymphs: 34 %
MCH: 33.8 pg — ABNORMAL HIGH (ref 26.6–33.0)
MCHC: 33 g/dL (ref 31.5–35.7)
MCV: 103 fL — ABNORMAL HIGH (ref 79–97)
Monocytes Absolute: 0.5 x10E3/uL (ref 0.1–0.9)
Monocytes: 7 %
Neutrophils Absolute: 3.7 x10E3/uL (ref 1.4–7.0)
Neutrophils: 57 %
RBC: 3.96 x10E6/uL (ref 3.77–5.28)
RDW: 12.3 % (ref 11.7–15.4)
WBC: 6.6 x10E3/uL (ref 3.4–10.8)

## 2024-01-06 LAB — LIPID PANEL
Chol/HDL Ratio: 2.2 ratio (ref 0.0–4.4)
Cholesterol, Total: 138 mg/dL (ref 100–199)
HDL: 62 mg/dL (ref >39)
LDL Chol Calc (NIH): 62 mg/dL (ref 0–99)
Triglycerides: 71 mg/dL (ref 0–149)
VLDL Cholesterol Cal: 14 mg/dL (ref 5–40)

## 2024-01-06 LAB — TSH: TSH: 5.41 u[IU]/mL — ABNORMAL HIGH (ref 0.450–4.500)

## 2024-01-06 LAB — T4: T4, Total: 7.7 ug/dL (ref 4.5–12.0)

## 2024-01-11 ENCOUNTER — Ambulatory Visit
Admission: RE | Admit: 2024-01-11 | Discharge: 2024-01-11 | Disposition: A | Discharge status: Home / Self Care | Source: Ambulatory Visit | Attending: Nurse Practitioner | Admitting: Nurse Practitioner

## 2024-01-11 DIAGNOSIS — R918 Other nonspecific abnormal finding of lung field: Secondary | ICD-10-CM | POA: Diagnosis not present

## 2024-01-11 DIAGNOSIS — Z720 Tobacco use: Secondary | ICD-10-CM | POA: Diagnosis not present

## 2024-01-11 DIAGNOSIS — R0600 Dyspnea, unspecified: Secondary | ICD-10-CM | POA: Diagnosis not present

## 2024-01-11 DIAGNOSIS — J432 Centrilobular emphysema: Secondary | ICD-10-CM | POA: Diagnosis not present

## 2024-01-11 DIAGNOSIS — J449 Chronic obstructive pulmonary disease, unspecified: Secondary | ICD-10-CM | POA: Diagnosis not present

## 2024-01-11 DIAGNOSIS — R053 Chronic cough: Secondary | ICD-10-CM | POA: Diagnosis not present

## 2024-01-12 DIAGNOSIS — G894 Chronic pain syndrome: Secondary | ICD-10-CM | POA: Diagnosis not present

## 2024-01-12 DIAGNOSIS — Z79891 Long term (current) use of opiate analgesic: Secondary | ICD-10-CM | POA: Diagnosis not present

## 2024-01-12 DIAGNOSIS — F17210 Nicotine dependence, cigarettes, uncomplicated: Secondary | ICD-10-CM | POA: Diagnosis not present

## 2024-01-14 ENCOUNTER — Other Ambulatory Visit: Payer: Self-pay | Admitting: Nurse Practitioner

## 2024-01-16 NOTE — Telephone Encounter (Signed)
 Discontinued on 11/04/23, will refuse this request.  Requested Prescriptions  Pending Prescriptions Disp Refills   fluticasone  (FLONASE ) 50 MCG/ACT nasal spray [Pharmacy Med Name: FLUTICASONE  PROPIONATE 50 MCG/ACT N] 16 g 6    Sig: PLACE 2 SPRAYS INTO BOTH NOSTRILS DAILY     There is no refill protocol information for this order

## 2024-01-16 NOTE — Telephone Encounter (Signed)
 Requested Prescriptions  Pending Prescriptions Disp Refills   atorvastatin  (LIPITOR) 80 MG tablet [Pharmacy Med Name: ATORVASTATIN  CALCIUM  80 MG TAB] 90 tablet 3    Sig: TAKE 1 TABLET BY MOUTH DAILY     Cardiovascular:  Antilipid - Statins Failed - 01/16/2024  9:25 PM      Failed - Lipid Panel in normal range within the last 12 months    Cholesterol, Total  Date Value Ref Range Status  01/05/2024 138 100 - 199 mg/dL Final   LDL Chol Calc (NIH)  Date Value Ref Range Status  01/05/2024 62 0 - 99 mg/dL Final   HDL  Date Value Ref Range Status  01/05/2024 62 >39 mg/dL Final   Triglycerides  Date Value Ref Range Status  01/05/2024 71 0 - 149 mg/dL Final         Passed - Patient is not pregnant      Passed - Valid encounter within last 12 months    Recent Outpatient Visits           1 week ago Thrombocytopenia [facility])   [place] [facility] Melvin Pao, NP   1 month ago Acute non-recurrent frontal sinusitis   [place] [facility] Melvin Pao, NP   2 months ago Appointment canceled by hospital   [facility] Melvin Pao, NP   4 months ago Acquired hypothyroidism   [place] [facility] Melvin Pao, NP   6 months ago Advanced care planning/counseling discussion    [facility] Melvin Pao, NP

## 2024-01-18 DIAGNOSIS — R002 Palpitations: Secondary | ICD-10-CM | POA: Diagnosis not present

## 2024-01-19 DIAGNOSIS — R002 Palpitations: Secondary | ICD-10-CM | POA: Diagnosis not present

## 2024-01-22 ENCOUNTER — Ambulatory Visit: Payer: Self-pay | Admitting: Nurse Practitioner

## 2024-01-25 NOTE — Progress Notes (Signed)
Left pt message to return call 

## 2024-01-26 ENCOUNTER — Ambulatory Visit: Attending: Physician Assistant

## 2024-01-26 DIAGNOSIS — R0602 Shortness of breath: Secondary | ICD-10-CM | POA: Diagnosis not present

## 2024-01-26 DIAGNOSIS — K219 Gastro-esophageal reflux disease without esophagitis: Secondary | ICD-10-CM | POA: Diagnosis not present

## 2024-01-26 DIAGNOSIS — R490 Dysphonia: Secondary | ICD-10-CM | POA: Diagnosis not present

## 2024-01-26 DIAGNOSIS — H903 Sensorineural hearing loss, bilateral: Secondary | ICD-10-CM | POA: Diagnosis not present

## 2024-01-26 DIAGNOSIS — J381 Polyp of vocal cord and larynx: Secondary | ICD-10-CM | POA: Diagnosis not present

## 2024-01-26 DIAGNOSIS — F17200 Nicotine dependence, unspecified, uncomplicated: Secondary | ICD-10-CM | POA: Diagnosis not present

## 2024-01-26 LAB — ECHOCARDIOGRAM COMPLETE
AR max vel: 1.73 cm2
AV Area VTI: 1.6 cm2
AV Area mean vel: 1.58 cm2
AV Mean grad: 4 mmHg
AV Peak grad: 8.4 mmHg
Ao pk vel: 1.45 m/s
Area-P 1/2: 3.79 cm2
MV VTI: 1.64 cm2
S' Lateral: 3.7 cm

## 2024-02-02 ENCOUNTER — Other Ambulatory Visit

## 2024-02-12 ENCOUNTER — Telehealth: Payer: Self-pay

## 2024-02-12 DIAGNOSIS — M4726 Other spondylosis with radiculopathy, lumbar region: Secondary | ICD-10-CM

## 2024-02-12 DIAGNOSIS — H905 Unspecified sensorineural hearing loss: Secondary | ICD-10-CM | POA: Diagnosis not present

## 2024-02-12 NOTE — Telephone Encounter (Signed)
 Routing to provider to advise. Can referral be entered for the patient?

## 2024-02-12 NOTE — Telephone Encounter (Signed)
 Copied from CRM [id number]. Topic: Referral - Request for Referral >> Feb 12, 2024  9:36 AM Ivette P wrote: Did the patient discuss referral with their provider in the last year? Yes (If No - schedule appointment) (If Yes - send message)  Appointment offered? Yes  Type of order/referral and detailed reason for visit: Pain Management   Preference of office, provider, location:  [facility] [address], [place], [place] [address] Phone: [phone] Fax: [phone]  If referral order, have you been seen by this specialty before? Yes (If Yes, this issue or another issue? When? Where?  Pt previous [facility], she is moving back to her office in [place]  Can we respond through MyChart? Yes

## 2024-02-15 NOTE — Telephone Encounter (Signed)
 Called and LVM letting patient know that her referral has been placed.

## 2024-02-15 NOTE — Telephone Encounter (Signed)
 Referral placed.

## 2024-02-18 DIAGNOSIS — Z0000 Encounter for general adult medical examination without abnormal findings: Secondary | ICD-10-CM | POA: Diagnosis not present

## 2024-02-18 DIAGNOSIS — Z131 Encounter for screening for diabetes mellitus: Secondary | ICD-10-CM | POA: Diagnosis not present

## 2024-02-18 DIAGNOSIS — M129 Arthropathy, unspecified: Secondary | ICD-10-CM | POA: Diagnosis not present

## 2024-02-18 DIAGNOSIS — Z1159 Encounter for screening for other viral diseases: Secondary | ICD-10-CM | POA: Diagnosis not present

## 2024-02-18 DIAGNOSIS — Z79899 Other long term (current) drug therapy: Secondary | ICD-10-CM | POA: Diagnosis not present

## 2024-02-18 DIAGNOSIS — E559 Vitamin D deficiency, unspecified: Secondary | ICD-10-CM | POA: Diagnosis not present

## 2024-02-18 DIAGNOSIS — M549 Dorsalgia, unspecified: Secondary | ICD-10-CM | POA: Diagnosis not present

## 2024-02-18 DIAGNOSIS — M5030 Other cervical disc degeneration, unspecified cervical region: Secondary | ICD-10-CM | POA: Diagnosis not present

## 2024-02-20 ENCOUNTER — Other Ambulatory Visit: Payer: Self-pay | Admitting: Nurse Practitioner

## 2024-02-20 DIAGNOSIS — K219 Gastro-esophageal reflux disease without esophagitis: Secondary | ICD-10-CM

## 2024-02-22 DIAGNOSIS — I6523 Occlusion and stenosis of bilateral carotid arteries: Secondary | ICD-10-CM | POA: Diagnosis not present

## 2024-02-22 DIAGNOSIS — D696 Thrombocytopenia, unspecified: Secondary | ICD-10-CM | POA: Diagnosis not present

## 2024-02-22 DIAGNOSIS — M5030 Other cervical disc degeneration, unspecified cervical region: Secondary | ICD-10-CM | POA: Diagnosis not present

## 2024-02-22 DIAGNOSIS — M549 Dorsalgia, unspecified: Secondary | ICD-10-CM | POA: Diagnosis not present

## 2024-02-22 DIAGNOSIS — Z79899 Other long term (current) drug therapy: Secondary | ICD-10-CM | POA: Diagnosis not present

## 2024-02-22 DIAGNOSIS — F17210 Nicotine dependence, cigarettes, uncomplicated: Secondary | ICD-10-CM | POA: Diagnosis not present

## 2024-02-22 NOTE — Telephone Encounter (Signed)
 Too  soon for refill, LRF 09/16/23 FOR 90 AND 1 RF.  Requested Prescriptions  Pending Prescriptions Disp Refills   levothyroxine  (SYNTHROID ) 175 MCG tablet [Pharmacy Med Name: LEVOTHYROXINE  SODIUM 175 MCG TAB] 90 tablet 1    Sig: TAKE 1 TABLET BY MOUTH DAILY     Endocrinology:  Hypothyroid Agents Failed - 02/22/2024  4:01 PM      Failed - TSH in normal range and within 360 days    TSH  Date Value Ref Range Status  01/05/2024 5.410 (H) 0.450 - 4.500 uIU/mL Final         Passed - Valid encounter within last 12 months    Recent Outpatient Visits           1 month ago Thrombocytopenia [facility])   [place] [facility] Melvin Pao, NP   2 months ago Acute non-recurrent frontal sinusitis   [place] [facility] Melvin Pao, NP   4 months ago Appointment canceled by hospital   [facility], [facility]   5 months ago Acquired hypothyroidism   [place] [facility] Melvin Pao, NP   7 months ago Advanced care planning/counseling discussion   [place] [facility] Melvin Pao, NP               esomeprazole  (NEXIUM ) 40 MG capsule [Pharmacy Med Name: ESOMEPRAZOLE  MAGNESIUM  40 MG CAP] 180 capsule 1    Sig: TAKE 1 CAPSULE BY MOUTH TWICE DAILY     Gastroenterology: Proton Pump Inhibitors 2 Passed - 02/22/2024  4:01 PM      Passed - ALT in normal range and within 360 days    ALT  Date Value Ref Range Status  01/05/2024 18 0 - 32 IU/L Final   SGPT (ALT)  Date Value Ref Range Status  10/29/2013 13 12 - 78 U/L Final         Passed - AST in normal range and within 360 days    AST  Date Value Ref Range Status  01/05/2024 13 0 - 40 IU/L Final   SGOT(AST)  Date Value Ref Range Status  10/29/2013 17 15 - 37 Unit/L Final         Passed - Valid encounter within last 12 months    Recent Outpatient Visits           1 month ago Thrombocytopenia [facility])   [place]  [facility], NP   2 months ago Acute non-recurrent frontal sinusitis   [place] [facility] Melvin Pao, NP   4 months ago Appointment canceled by hospital   [facility]   5 months ago Acquired hypothyroidism   [place] [facility] Melvin Pao, NP   7 months ago Advanced care planning/counseling discussion   [place] [facility] Melvin Pao, NP

## 2024-02-23 ENCOUNTER — Other Ambulatory Visit

## 2024-02-25 ENCOUNTER — Other Ambulatory Visit: Payer: Self-pay | Admitting: Nurse Practitioner

## 2024-02-26 NOTE — Telephone Encounter (Signed)
 Requested medications are due for refill today.  yes  Requested medications are on the active medications list.  yes  Last refill. 11/26/2023 #30 0 rf  Future visit scheduled.   yes  Notes to clinic.  Refill not delegated.    Requested Prescriptions  Pending Prescriptions Disp Refills   ondansetron  (ZOFRAN -ODT) 4 MG disintegrating tablet [Pharmacy Med Name: ONDANSETRON  4 MG ODT] 30 tablet 0    Sig: TAKE 1 TABLET BY MOUTH EVERY 8 HOURS AS NEEDED     Not Delegated - Gastroenterology: Antiemetics - ondansetron  Failed - 02/26/2024  1:30 PM      Failed - This refill cannot be delegated      Passed - AST in normal range and within 360 days    AST  Date Value Ref Range Status  01/05/2024 13 0 - 40 IU/L Final   SGOT(AST)  Date Value Ref Range Status  10/29/2013 17 15 - 37 Unit/L Final         Passed - ALT in normal range and within 360 days    ALT  Date Value Ref Range Status  01/05/2024 18 0 - 32 IU/L Final   SGPT (ALT)  Date Value Ref Range Status  10/29/2013 13 12 - 78 U/L Final         Passed - Valid encounter within last 6 months    Recent Outpatient Visits           1 month ago Thrombocytopenia [facility])   [place] [facility] Melvin Pao, NP   2 months ago Acute non-recurrent frontal sinusitis   [place] [facility] Mel[facility], NP   4 months ago Appointment canceled by hospital   [facility] Mel[facility] Pao, NP   5 months ago Acquired hypothyroidism   [place] [facility] Melvin Pao, NP   7 months ago Advanced care planning/counseling discussion   [place] [facility] Melvin Pao, NP

## 2024-03-01 ENCOUNTER — Telehealth: Payer: Self-pay | Admitting: Nurse Practitioner

## 2024-03-01 ENCOUNTER — Other Ambulatory Visit: Payer: Self-pay | Admitting: Nurse Practitioner

## 2024-03-01 MED ORDER — ALBUTEROL SULFATE HFA 108 (90 BASE) MCG/ACT IN AERS: 2.0000 | INHALATION_SPRAY | RESPIRATORY_TRACT | 3 refills | Status: AC | PRN

## 2024-03-01 NOTE — Telephone Encounter (Signed)
 Copied from CRM #[id number]. Topic: Clinical - Medication Refill >> Mar 01, 2024  4:41 PM Debby BROCKS wrote: Medication: albuterol  (VENTOLIN  HFA) 108 (90 Base) MCG/ACT inhaler ondansetron  (ZOFRAN -ODT) 4 MG disintegrating tablet   Has the patient contacted their pharmacy? Yes (Agent: If no, request that the patient contact the pharmacy for the refill. If patient does not wish to contact the pharmacy document the reason why and proceed with request.) (Agent: If yes, when and what did the pharmacy advise?) Pharmacy states they have sent the request but no response  This is the patient's preferred pharmacy:  [facility] - [place], [place] - [address] [place] [address] Phone: [phone] Fax: [phone]  Is this the correct pharmacy for this prescription? Yes If no, delete pharmacy and type the correct one.   Has the prescription been filled recently? Yes  Is the patient out of the medication? Yes  Has the patient been seen for an appointment in the last year OR does the patient have an upcoming appointment? Yes  Can we respond through MyChart? Yes  Agent: Please be advised that Rx refills may take up to 3 business days. We ask that you follow-up with your pharmacy.

## 2024-03-01 NOTE — Telephone Encounter (Signed)
 Zofran  refilled 02/29/24.

## 2024-03-01 NOTE — Telephone Encounter (Unsigned)
 Copied from CRM [id number]. Topic: Clinical - Medication Refill >> Mar 01, 2024  4:40 PM Leah C wrote: Medication: albuterol  (VENTOLIN  HFA) 108 (90 Base) MCG/ACT inhaler  Has the patient contacted their pharmacy? Pharmacy contacted and stated they have tried to get the medication over but it failed and needs the clinic to send over the request.    This is the patient's preferred pharmacy:  [facility] - [place], [place] - [address] [place] [address] Phone: [phone] Fax: [phone]  Is this the correct pharmacy for this prescription? Yes If no, delete pharmacy and type the correct one.   Has the prescription been filled recently? Yes  Is the patient out of the medication? Yes  Has the patient been seen for an appointment in the last year OR does the patient have an upcoming appointment? Yes  Can we respond through MyChart? Yes  Agent: Please be advised that Rx refills may take up to 3 business days. We ask that you follow-up with your pharmacy.

## 2024-03-01 NOTE — Telephone Encounter (Signed)
 Requested Prescriptions  Pending Prescriptions Disp Refills   albuterol  (VENTOLIN  HFA) 108 (90 Base) MCG/ACT inhaler 1 each 3    Sig: Inhale 2 puffs into the lungs every 4 (four) hours as needed for wheezing or shortness of breath.     Pulmonology:  Beta Agonists 2 Passed - 03/01/2024  4:53 PM      Passed - Last BP in normal range    BP Readings from Last 1 Encounters:  01/05/24 103/64         Passed - Last Heart Rate in normal range    Pulse Readings from Last 1 Encounters:  01/05/24 67         Passed - Valid encounter within last 12 months    Recent Outpatient Visits           1 month ago Thrombocytopenia   [place] [facility] Pa[facility], NP   3 months ago Acute non-recurrent frontal sinusitis   [place] [facility] Pa[facility], NP   4 months ago Appointment canceled by hospital   [facility], [facility]   5 months ago Acquired hypothyroidism   [place] [facility] Melvin Pao, NP   7 months ago Advanced care planning/counseling discussion   [place] [facility] Melvin Pao, NP

## 2024-03-09 ENCOUNTER — Ambulatory Visit: Admitting: Nurse Practitioner

## 2024-03-09 ENCOUNTER — Encounter: Payer: Self-pay | Admitting: Nurse Practitioner

## 2024-03-09 VITALS — BP 113/70 | HR 72 | Ht 61.0 in | Wt 180.8 lb

## 2024-03-09 DIAGNOSIS — T148XXA Other injury of unspecified body region, initial encounter: Secondary | ICD-10-CM

## 2024-03-09 DIAGNOSIS — J302 Other seasonal allergic rhinitis: Secondary | ICD-10-CM

## 2024-03-09 MED ORDER — MONTELUKAST SODIUM 10 MG PO TABS: 10.0000 mg | ORAL_TABLET | Freq: Every day | ORAL | 1 refills | Status: AC

## 2024-03-09 NOTE — Progress Notes (Signed)
 BP 113/70 (BP Location: Left Arm, Patient Position: Sitting, Cuff Size: Normal)   Pulse 72   Ht 5' 1 (1.549 m)   Wt 180 lb 12.8 oz (82 kg)   SpO2 98%   BMI 34.16 kg/m    Subjective:    Patient ID: Janet Murray, female    DOB: [birth year], 53 y.o.   MRN: [id number]  HPI: Janet Murray is a 53 y.o. female  Chief Complaint  Patient presents with   Results   Sinusitis    Symptoms are draining in ears and back of throat, joint pain, singulair  for prior treatment    Patient states her ears are draining, throat feels like there is drainage.  She is having muscle pain and knee pain.  She has used Singulair  in the past and would like to restart the medication.  She states she has a bad cramp in her side.  She is using the tizandine  and Oxycodone .   Relevant past medical, surgical, family and social history reviewed and updated as indicated. Interim medical history since our last visit reviewed. Allergies and medications reviewed and updated.  Review of Systems  HENT:  Positive for congestion, ear discharge, postnasal drip and sore throat.   Musculoskeletal:        Left side pain    Per HPI unless specifically indicated above     Objective:    BP 113/70 (BP Location: Left Arm, Patient Position: Sitting, Cuff Size: Normal)   Pulse 72   Ht 5' 1 (1.549 m)   Wt 180 lb 12.8 oz (82 kg)   SpO2 98%   BMI 34.16 kg/m   Wt Readings from Last 3 Encounters:  03/09/24 180 lb 12.8 oz (82 kg)  01/05/24 177 lb 6.4 oz (80.5 kg)  01/01/24 177 lb 6.4 oz (80.5 kg)    Physical Exam Vitals and nursing note reviewed.  Constitutional:      General: She is not in acute distress.    Appearance: Normal appearance. She is normal weight. She is not ill-appearing, toxic-appearing or diaphoretic.  HENT:     Head: Normocephalic.     Right Ear: External ear normal. A middle ear effusion is present.     Left Ear: External ear normal. A middle ear effusion is present.     Nose: Congestion and  rhinorrhea present.     Mouth/Throat:     Mouth: Mucous membranes are moist.     Pharynx: Oropharynx is clear. Posterior oropharyngeal erythema present.  Eyes:     General:        Right eye: No discharge.        Left eye: No discharge.     Extraocular Movements: Extraocular movements intact.     Conjunctiva/sclera: Conjunctivae normal.     Pupils: Pupils are equal, round, and reactive to light.  Cardiovascular:     Rate and Rhythm: Normal rate and regular rhythm.     Heart sounds: No murmur heard. Pulmonary:     Effort: Pulmonary effort is normal. No respiratory distress.     Breath sounds: Normal breath sounds. No wheezing or rales.  Musculoskeletal:     Cervical back: Normal range of motion and neck supple.  Skin:    General: Skin is warm and dry.     Capillary Refill: Capillary refill takes less than 2 seconds.  Neurological:     General: No focal deficit present.     Mental Status: She is alert and oriented to person, place,  and time. Mental status is at baseline.  Psychiatric:        Mood and Affect: Mood normal.        Behavior: Behavior normal.        Thought Content: Thought content normal.        Judgment: Judgment normal.     Results for orders placed or performed in visit on 01/26/24  ECHOCARDIOGRAM COMPLETE   Collection Time: 01/26/24  2:55 PM  Result Value Ref Range   AR max vel 1.73 cm2   AV Peak grad 8.4 mmHg   Ao pk vel 1.45 m/s   S' Lateral 3.70 cm   Area-P 1/2 3.79 cm2   AV Area VTI 1.60 cm2   AV Mean grad 4.0 mmHg   AV Area mean vel 1.58 cm2   MV VTI 1.64 cm2   Est EF 55 - 60%       Assessment & Plan:   Problem List Items Addressed This Visit   None Visit Diagnoses       Seasonal allergic rhinitis, unspecified trigger    -  Primary   Will restart Singulair .  Follow up if symptoms are not improved.     Muscle strain       Recommend increasing Tizanidine  from 2mg  to 4mg .  Keep appointment with pain management.        Follow up plan: No  follow-ups on file.

## 2024-03-22 DIAGNOSIS — D696 Thrombocytopenia, unspecified: Secondary | ICD-10-CM | POA: Diagnosis not present

## 2024-03-22 DIAGNOSIS — M549 Dorsalgia, unspecified: Secondary | ICD-10-CM | POA: Diagnosis not present

## 2024-03-22 DIAGNOSIS — I6523 Occlusion and stenosis of bilateral carotid arteries: Secondary | ICD-10-CM | POA: Diagnosis not present

## 2024-03-22 DIAGNOSIS — Z79899 Other long term (current) drug therapy: Secondary | ICD-10-CM | POA: Diagnosis not present

## 2024-03-22 DIAGNOSIS — Z780 Asymptomatic menopausal state: Secondary | ICD-10-CM | POA: Diagnosis not present

## 2024-03-22 DIAGNOSIS — M5030 Other cervical disc degeneration, unspecified cervical region: Secondary | ICD-10-CM | POA: Diagnosis not present

## 2024-03-23 ENCOUNTER — Telehealth: Payer: Self-pay | Admitting: Nurse Practitioner

## 2024-03-23 ENCOUNTER — Other Ambulatory Visit: Payer: Self-pay | Admitting: Nurse Practitioner

## 2024-03-23 DIAGNOSIS — K219 Gastro-esophageal reflux disease without esophagitis: Secondary | ICD-10-CM

## 2024-03-23 NOTE — Telephone Encounter (Signed)
 PA team can you guys start this PA please?

## 2024-03-23 NOTE — Telephone Encounter (Signed)
PA request has been Submitted.

## 2024-03-23 NOTE — Telephone Encounter (Signed)
 Copied from CRM #[id number]. Topic: Clinical - Medication Prior Auth >> Mar 23, 2024  1:55 PM Lonell PEDLAR wrote: Reason for CRM: Patient is needing a PA for diclofenac Sodium (VOLTAREN) 1 % GEL CMM Key: [id number]

## 2024-03-24 DIAGNOSIS — Z79899 Other long term (current) drug therapy: Secondary | ICD-10-CM | POA: Diagnosis not present

## 2024-03-25 NOTE — Telephone Encounter (Signed)
 Requested Prescriptions  Pending Prescriptions Disp Refills   esomeprazole  (NEXIUM ) 40 MG capsule [Pharmacy Med Name: ESOMEPRAZOLE  MAGNESIUM  40 MG CAP] 180 capsule 0    Sig: TAKE 1 CAPSULE BY MOUTH TWICE DAILY     Gastroenterology: Proton Pump Inhibitors 2 Passed - 03/25/2024 11:32 AM      Passed - ALT in normal range and within 360 days    ALT  Date Value Ref Range Status  01/05/2024 18 0 - 32 IU/L Final   SGPT (ALT)  Date Value Ref Range Status  10/29/2013 13 12 - 78 U/L Final         Passed - AST in normal range and within 360 days    AST  Date Value Ref Range Status  01/05/2024 13 0 - 40 IU/L Final   SGOT(AST)  Date Value Ref Range Status  10/29/2013 17 15 - 37 Unit/L Final         Passed - Valid encounter within last 12 months    Recent Outpatient Visits           2 weeks ago Seasonal allergic rhinitis, unspecified trigger   [place] [facility] Melvin Pao, NP   2 months ago Thrombocytopenia   [place] [facility] Melvin Pa[facility], NP   3 months ago Acute non-recurrent frontal sinusitis   [place] [facility] Melvin Pao, NP   5 months ago Appointment canceled by hospital   [facility], [facility]   6 months ago Acquired hypothyroidism   [place] [facility] Melvin Pao, NP               levothyroxine  (SYNTHROID ) 175 MCG tablet [Pharmacy Med Name: LEVOTHYROXINE  SODIUM 175 MCG TAB] 90 tablet 0    Sig: TAKE 1 TABLET BY MOUTH DAILY     Endocrinology:  Hypothyroid Agents Failed - 03/25/2024 11:32 AM      Failed - TSH in normal range and within 360 days    TSH  Date Value Ref Range Status  01/05/2024 5.410 (H) 0.450 - 4.500 uIU/mL Final         Passed - Valid encounter within last 12 months    Recent Outpatient Visits           2 weeks ago Seasonal allergic rhinitis, unspecified trigger   [place] [facility] Melvin Pao, NP   2 months ago Thrombocytopenia   [place] [facility] Melvin [facility], NP   3 months ago Acute non-recurrent frontal sinusitis   [place] [facility] Melvin Pa[facility], [facility]   5 months ago Appointment canceled by hospital   [facility] Mel[facility], [facility]   6 months ago Acquired hypothyroidism    [facility] Melvin Pao, NP

## 2024-04-06 ENCOUNTER — Encounter: Payer: Self-pay | Admitting: Nurse Practitioner

## 2024-04-06 ENCOUNTER — Ambulatory Visit (INDEPENDENT_AMBULATORY_CARE_PROVIDER_SITE_OTHER): Admitting: Nurse Practitioner

## 2024-04-06 VITALS — BP 119/83 | HR 75 | Temp 98.2°F | Resp 16 | Ht 60.98 in | Wt 181.8 lb

## 2024-04-06 DIAGNOSIS — J302 Other seasonal allergic rhinitis: Secondary | ICD-10-CM

## 2024-04-06 MED ORDER — AZITHROMYCIN 250 MG PO TABS: ORAL_TABLET | ORAL | 0 refills | Status: AC

## 2024-04-06 NOTE — Progress Notes (Signed)
 BP 119/83 (BP Location: Left Arm, Patient Position: Sitting, Cuff Size: Normal)   Pulse 75   Temp 98.2 F (36.8 C) (Oral)   Resp 16   Ht 5' 0.98 (1.549 m)   Wt 181 lb 12.8 oz (82.5 kg)   SpO2 96%   BMI 34.37 kg/m    Subjective:    Patient ID: Janet Murray, female    DOB: May 30, 1971, 53 y.o.   MRN: [id number]  HPI: KALIE Murray is a 53 y.o. female  Chief Complaint  Patient presents with   Follow-up    Back muscle strain- doing better   Allergies    Has a cough about a week now being gagged by post nasal drip.    ALLERGIES Allergies have been bad for about a week now.  She has been coughing and being gagged by the post nasal drip.  Feels like the cough is dry and she wakes up and feels like she can't breath.    Relevant past medical, surgical, family and social history reviewed and updated as indicated. Interim medical history since our last visit reviewed. Allergies and medications reviewed and updated.  Review of Systems  HENT:  Positive for congestion and postnasal drip.   Respiratory:  Positive for cough.     Per HPI unless specifically indicated above     Objective:    BP 119/83 (BP Location: Left Arm, Patient Position: Sitting, Cuff Size: Normal)   Pulse 75   Temp 98.2 F (36.8 C) (Oral)   Resp 16   Ht 5' 0.98 (1.549 m)   Wt 181 lb 12.8 oz (82.5 kg)   SpO2 96%   BMI 34.37 kg/m   Wt Readings from Last 3 Encounters:  04/06/24 181 lb 12.8 oz (82.5 kg)  03/09/24 180 lb 12.8 oz (82 kg)  01/05/24 177 lb 6.4 oz (80.5 kg)    Physical Exam Vitals and nursing note reviewed.  Constitutional:      General: She is not in acute distress.    Appearance: Normal appearance. She is normal weight. She is not ill-appearing, toxic-appearing or diaphoretic.  HENT:     Head: Normocephalic.     Right Ear: External ear normal.     Left Ear: External ear normal.     Nose: Congestion and rhinorrhea present.     Mouth/Throat:     Mouth: Mucous membranes are moist.      Pharynx: Oropharynx is clear. Posterior oropharyngeal erythema present. No oropharyngeal exudate.  Eyes:     General:        Right eye: No discharge.        Left eye: No discharge.     Extraocular Movements: Extraocular movements intact.     Conjunctiva/sclera: Conjunctivae normal.     Pupils: Pupils are equal, round, and reactive to light.  Cardiovascular:     Rate and Rhythm: Normal rate and regular rhythm.     Heart sounds: No murmur heard. Pulmonary:     Effort: Pulmonary effort is normal. No respiratory distress.     Breath sounds: Normal breath sounds. No wheezing or rales.  Musculoskeletal:     Cervical back: Normal range of motion and neck supple.  Skin:    General: Skin is warm and dry.     Capillary Refill: Capillary refill takes less than 2 seconds.  Neurological:     General: No focal deficit present.     Mental Status: She is alert and oriented to person, place, and time.  Mental status is at baseline.  Psychiatric:        Mood and Affect: Mood normal.        Behavior: Behavior normal.        Thought Content: Thought content normal.        Judgment: Judgment normal.     Results for orders placed or performed in visit on 01/26/24  ECHOCARDIOGRAM COMPLETE   Collection Time: 01/26/24  2:55 PM  Result Value Ref Range   AR max vel 1.73 cm2   AV Peak grad 8.4 mmHg   Ao pk vel 1.45 m/s   S' Lateral 3.70 cm   Area-P 1/2 3.79 cm2   AV Area VTI 1.60 cm2   AV Mean grad 4.0 mmHg   AV Area mean vel 1.58 cm2   MV VTI 1.64 cm2   Est EF 55 - 60%       Assessment & Plan:   Problem List Items Addressed This Visit   None Visit Diagnoses       Seasonal allergic rhinitis, unspecified trigger    -  Primary   Will treat with azithromycin  at patient's request.  recommend continuing with zyrtec and singulair .  Would benefit from nasal spray- patient declines.        Follow up plan: Return in about 3 months (around 07/07/2024) for HTN, HLD, DM2 FU.

## 2024-04-27 ENCOUNTER — Other Ambulatory Visit: Payer: Self-pay | Admitting: Nurse Practitioner

## 2024-04-27 DIAGNOSIS — Z780 Asymptomatic menopausal state: Secondary | ICD-10-CM

## 2024-04-27 DIAGNOSIS — Z1231 Encounter for screening mammogram for malignant neoplasm of breast: Secondary | ICD-10-CM

## 2024-04-27 DIAGNOSIS — I6523 Occlusion and stenosis of bilateral carotid arteries: Secondary | ICD-10-CM

## 2024-04-28 ENCOUNTER — Other Ambulatory Visit: Payer: Self-pay | Admitting: Pulmonary Disease

## 2024-04-28 DIAGNOSIS — J439 Emphysema, unspecified: Secondary | ICD-10-CM

## 2024-04-28 DIAGNOSIS — J449 Chronic obstructive pulmonary disease, unspecified: Secondary | ICD-10-CM

## 2024-05-04 ENCOUNTER — Ambulatory Visit

## 2024-05-10 ENCOUNTER — Ambulatory Visit: Admitting: Nurse Practitioner

## 2024-05-10 ENCOUNTER — Ambulatory Visit
Admission: RE | Admit: 2024-05-10 | Discharge: 2024-05-10 | Disposition: A | Discharge status: Home / Self Care | Source: Ambulatory Visit | Attending: Nurse Practitioner | Admitting: Nurse Practitioner

## 2024-05-10 ENCOUNTER — Encounter: Payer: Self-pay | Admitting: Nurse Practitioner

## 2024-05-10 VITALS — BP 119/74 | HR 72 | Temp 98.9°F | Ht 60.98 in | Wt 184.0 lb

## 2024-05-10 DIAGNOSIS — I6523 Occlusion and stenosis of bilateral carotid arteries: Secondary | ICD-10-CM | POA: Insufficient documentation

## 2024-05-10 DIAGNOSIS — R051 Acute cough: Secondary | ICD-10-CM | POA: Diagnosis not present

## 2024-05-10 MED ORDER — METHYLPREDNISOLONE 4 MG PO TBPK: ORAL_TABLET | ORAL | 0 refills | Status: DC

## 2024-05-10 MED ORDER — AZITHROMYCIN 250 MG PO TABS: ORAL_TABLET | ORAL | 0 refills | Status: AC

## 2024-05-10 NOTE — Progress Notes (Signed)
 BP 119/74 (BP Location: Left Arm, Patient Position: Sitting, Cuff Size: Normal)   Pulse 72   Temp 98.9 F (37.2 C) (Oral)   Ht 5' 0.98 (1.549 m)   Wt 184 lb (83.5 kg)   SpO2 97%   BMI 34.78 kg/m    Subjective:    Patient ID: Janet Murray, female    DOB: [birth year], 53 y.o.   MRN: [id number]  HPI: Janet Murray is a 53 y.o. female  Chief Complaint  Patient presents with   Cough    Patient stated she's had a cough since Wednesday.    UPPER RESPIRATORY TRACT INFECTION Worst symptom:Patient states her symptoms started last Wednesday.  She is coughing, ear is draining, Right ear pain, SOB.  She is using her nebulizer at home Fever: no Cough: yes Shortness of breath: yes Wheezing: yes Chest pain: no Chest tightness: no Chest congestion: no Nasal congestion: yes Runny nose: yes Post nasal drip: yes Sneezing: yes Sore throat: no Swollen glands: no Sinus pressure: yes Headache: yes Face pain: no Toothache: no Ear pain: yes right Ear pressure: yes bilateral Eyes red/itching:no Eye drainage/crusting: no  Vomiting: no Rash: no Fatigue: yes Sick contacts: no Strep contacts: no  Context: stable Recurrent sinusitis: no Relief with OTC cold/cough medications: yes  Treatments attempted: mucinex and anti-histamine   Relevant past medical, surgical, family and social history reviewed and updated as indicated. Interim medical history since our last visit reviewed. Allergies and medications reviewed and updated.  Review of Systems  Constitutional:  Positive for fatigue. Negative for fever.  HENT:  Positive for congestion, ear pain, postnasal drip, rhinorrhea, sinus pressure, sinus pain and sneezing. Negative for dental problem and sore throat.   Respiratory:  Positive for cough, shortness of breath and wheezing.   Cardiovascular:  Negative for chest pain.  Gastrointestinal:  Negative for vomiting.  Skin:  Negative for rash.  Neurological:  Negative for headaches.     Per HPI unless specifically indicated above     Objective:    BP 119/74 (BP Location: Left Arm, Patient Position: Sitting, Cuff Size: Normal)   Pulse 72   Temp 98.9 F (37.2 C) (Oral)   Ht 5' 0.98 (1.549 m)   Wt 184 lb (83.5 kg)   SpO2 97%   BMI 34.78 kg/m   Wt Readings from Last 3 Encounters:  05/10/24 184 lb (83.5 kg)  04/06/24 181 lb 12.8 oz (82.5 kg)  03/09/24 180 lb 12.8 oz (82 kg)    Physical Exam Vitals and nursing note reviewed.  Constitutional:      General: She is not in acute distress.    Appearance: Normal appearance. She is normal weight. She is not ill-appearing, toxic-appearing or diaphoretic.  HENT:     Head: Normocephalic.     Right Ear: External ear normal. A middle ear effusion is present.     Left Ear: External ear normal. A middle ear effusion is present.     Nose: Congestion and rhinorrhea present.     Mouth/Throat:     Mouth: Mucous membranes are moist.     Pharynx: Oropharynx is clear. Posterior oropharyngeal erythema present. No oropharyngeal exudate.  Eyes:     General:        Right eye: No discharge.        Left eye: No discharge.     Extraocular Movements: Extraocular movements intact.     Conjunctiva/sclera: Conjunctivae normal.     Pupils: Pupils are equal, round, and reactive  to light.  Cardiovascular:     Rate and Rhythm: Normal rate and regular rhythm.     Heart sounds: No murmur heard. Pulmonary:     Effort: Pulmonary effort is normal. No respiratory distress.     Breath sounds: Wheezing present. No rales.  Musculoskeletal:     Cervical back: Normal range of motion and neck supple.  Skin:    General: Skin is warm and dry.     Capillary Refill: Capillary refill takes less than 2 seconds.  Neurological:     General: No focal deficit present.     Mental Status: She is alert and oriented to person, place, and time. Mental status is at baseline.  Psychiatric:        Mood and Affect: Mood normal.        Behavior: Behavior  normal.        Thought Content: Thought content normal.        Judgment: Judgment normal.     Results for orders placed or performed in visit on 01/26/24  ECHOCARDIOGRAM COMPLETE   Collection Time: 01/26/24  2:55 PM  Result Value Ref Range   AR max vel 1.73 cm2   AV Peak grad 8.4 mmHg   Ao pk vel 1.45 m/s   S' Lateral 3.70 cm   Area-P 1/2 3.79 cm2   AV Area VTI 1.60 cm2   AV Mean grad 4.0 mmHg   AV Area mean vel 1.58 cm2   MV VTI 1.64 cm2   Est EF 55 - 60%       Assessment & Plan:   Problem List Items Addressed This Visit   None Visit Diagnoses       Acute cough    -  Primary   Will treat with Azithromycin  and prendisone.  Complete course of medication.  Follow up if symptoms not improved.        Follow up plan: No follow-ups on file.

## 2024-06-06 ENCOUNTER — Ambulatory Visit: Payer: Self-pay

## 2024-06-06 NOTE — Telephone Encounter (Signed)
 FYI Only or Action Required?: Action required by provider: update on patient condition and requesting another round of ABX and steroid.  Patient was last seen in primary care on 05/10/2024 by Melvin Pao, NP.  Called Nurse Triage reporting Facial Pain.  Symptoms began several days ago.  Interventions attempted: Rest, hydration, or home remedies.  Symptoms are: gradually worsening.  Triage Disposition: See HCP Within 4 Hours (Or PCP Triage)  Patient/caregiver understands and will follow disposition?: No, wishes to speak with PCP  Message from [facility] sent at 06/06/2024  8:45 AM EST  Reason for Triage: sinus infection/blockage ( left side of face is hurting, sensitive to touch/ swollen w/drainage ) - was seen 12/2 - it got better but has it again   Reason for Disposition  [1] Redness or swelling on the cheek, forehead or around the eye AND [2] no fever  Answer Assessment - Initial Assessment Questions Recently treated for sinus infection. Symptoms got mostly better but returned 2 days ago. Left nare completely blocked, facial pain 3/10 over the cheek (feels swollen) and behind the eye. Feels like water in her ears, congested.  Singulair , albuterol , benedryl with mild improvement temporarily.   Requesting another round of ABX and prednisone  to treat recurrent symptoms. Pharmacy confirmed. Advised UC or alternative office for appt, not open to going to another office for assessment. Will go to UC if worsens  1. LOCATION: Where does it hurt?      Left face  2. ONSET: When did the sinus pain start?  (e.g., hours, days)      2 days ago  3. SEVERITY: How bad is the pain?   (Scale 0-10; or none, mild, moderate or severe)     3/10 4. RECURRENT SYMPTOM: Have you ever had sinus problems before? If Yes, ask: When was the last time? and What happened that time?      Recently treated 12/2 5. NASAL CONGESTION: Is the nose blocked? If Yes, ask: Can you open it or must you  breathe through your mouth?     Left nare blocked with pain and pressure 6. NASAL DISCHARGE: Do you have discharge from your nose? If so ask, What color?     clear 7. FEVER: Do you have a fever? If Yes, ask: What is it, how was it measured, and when did it start?      Denies  8. OTHER SYMPTOMS: Do you have any other symptoms? (e.g., sore throat, cough, earache, difficulty breathing)     Ear pressure/fluid in ear  Protocols used: Sinus Pain or Congestion-A-AH

## 2024-06-29 ENCOUNTER — Other Ambulatory Visit: Payer: Self-pay | Admitting: Nurse Practitioner

## 2024-06-30 NOTE — Telephone Encounter (Signed)
 Requested Prescriptions  Pending Prescriptions Disp Refills   MOUNJARO  5 MG/0.5ML Pen [Pharmacy Med Name: MOUNJARO  5 MG/0.5ML SUBQ SOLN ML] 6 mL 1    Sig: INJECT 5 MG INTO THE SKIN ONCE WEEKLY     Off-Protocol Failed - 06/30/2024  9:13 AM      Failed - Medication not assigned to a protocol, review manually.      Passed - Valid encounter within last 12 months    Recent Outpatient Visits           1 month ago Acute cough   [place] [facility] Melvin Pa[facility], NP   2 months ago Seasonal allergic rhinitis, unspecified trigger   [place] [facility] Melvin Pao, NP   3 months ago Seasonal allergic rhinitis, unspecified trigger   [place] [facility] Melvin Pao, NP   5 months ago Thrombocytopenia   [place] [facility] Melvin [facility], NP   7 months ago Acute non-recurrent frontal sinusitis   [place] [facility] Melvin Pao, NP               levothyroxine  (SYNTHROID ) 175 MCG tablet [Pharmacy Med Name: LEVOTHYROXINE  SODIUM 175 MCG TAB] 90 tablet 0    Sig: TAKE 1 TABLET BY MOUTH DAILY     Endocrinology:  Hypothyroid Agents Failed - 06/30/2024  9:13 AM      Failed - TSH in normal range and within 360 days    TSH  Date Value Ref Range Status  01/05/2024 5.410 (H) 0.450 - 4.500 uIU/mL Final         Passed - Valid encounter within last 12 months    Recent Outpatient Visits           1 month ago Acute cough   [place] [facility] Melvin Pa[facility], NP   2 months ago Seasonal allergic rhinitis, unspecified trigger   [place] [facility] Mel[facility] Pao, NP   3 months ago Seasonal allergic rhinitis, unspecified trigger   [place] [facility] Melvin Pao, NP   5 months ago Thrombocytopenia   [place] [facility] Mel[facility] Pao, NP   7 months ago Acute non-recurrent frontal sinusitis   [place]  [facility] Melvin Pao, NP

## 2024-06-30 NOTE — Telephone Encounter (Signed)
 Requested medication (s) are due for refill today: yes  Requested medication (s) are on the active medication list: yes  Last refill:  06/30/23  Future visit scheduled: yes  Notes to clinic:   Medication not assigned to a protocol, review manually.      Requested Prescriptions  Pending Prescriptions Disp Refills   MOUNJARO  5 MG/0.5ML Pen [Pharmacy Med Name: MOUNJARO  5 MG/0.5ML SUBQ SOLN ML] 6 mL 1    Sig: INJECT 5 MG INTO THE SKIN ONCE WEEKLY     Off-Protocol Failed - 06/30/2024  9:14 AM      Failed - Medication not assigned to a protocol, review manually.      Passed - Valid encounter within last 12 months    Recent Outpatient Visits           1 month ago Acute cough   [place] [facility] Melvin Pao, NP   2 months ago Seasonal allergic rhinitis, unspecified trigger   [place] [facility] Melvin Pao, NP   3 months ago Seasonal allergic rhinitis, unspecified trigger   [place] [facility] Melvin Pao, NP   5 months ago Thrombocytopenia   [place] [facility] Melvin Pao, NP   7 months ago Acute non-recurrent frontal sinusitis   [place] [facility] Melvin Pao, NP              Signed Prescriptions Disp Refills   levothyroxine  (SYNTHROID ) 175 MCG tablet 90 tablet 0    Sig: TAKE 1 TABLET BY MOUTH DAILY     Endocrinology:  Hypothyroid Agents Failed - 06/30/2024  9:14 AM      Failed - TSH in normal range and within 360 days    TSH  Date Value Ref Range Status  01/05/2024 5.410 (H) 0.450 - 4.500 uIU/mL Final         Passed - Valid encounter within last 12 months    Recent Outpatient Visits           1 month ago Acute cough   [place] [facility] Melvin Pao, NP   2 months ago Seasonal allergic rhinitis, unspecified trigger   [place] [facility] Melvin Pao, NP   3 months ago Seasonal allergic rhinitis,  unspecified trigger   [place] [facility] Melvin Pao, NP   5 months ago Thrombocytopenia   [place] [facility] Melvin Pao, NP   7 months ago Acute non-recurrent frontal sinusitis   [place] [facility] Melvin Pao, NP

## 2024-07-01 ENCOUNTER — Other Ambulatory Visit: Payer: Self-pay | Admitting: Nurse Practitioner

## 2024-07-01 DIAGNOSIS — K219 Gastro-esophageal reflux disease without esophagitis: Secondary | ICD-10-CM

## 2024-07-01 NOTE — Telephone Encounter (Signed)
 Requested Prescriptions  Pending Prescriptions Disp Refills   esomeprazole  (NEXIUM ) 40 MG capsule [Pharmacy Med Name: ESOMEPRAZOLE  MAGNESIUM  40 MG CAP] 180 capsule 1    Sig: TAKE 1 CAPSULE BY MOUTH TWICE DAILY     Gastroenterology: Proton Pump Inhibitors 2 Passed - 07/01/2024  4:20 PM      Passed - ALT in normal range and within 360 days    ALT  Date Value Ref Range Status  01/05/2024 18 0 - 32 IU/L Final   SGPT (ALT)  Date Value Ref Range Status  10/29/2013 13 12 - 78 U/L Final         Passed - AST in normal range and within 360 days    AST  Date Value Ref Range Status  01/05/2024 13 0 - 40 IU/L Final   SGOT(AST)  Date Value Ref Range Status  10/29/2013 17 15 - 37 Unit/L Final         Passed - Valid encounter within last 12 months    Recent Outpatient Visits           1 month ago Acute cough   [place] [facility] Melvin Pao, NP   2 months ago Seasonal allergic rhinitis, unspecified trigger   [place] [facility] Melvin Pao, NP   3 months ago Seasonal allergic rhinitis, unspecified trigger   [place] [facility] Melvin Pao, NP   5 months ago Thrombocytopenia   [place] [facility] Melvin Pao, NP   7 months ago Acute non-recurrent frontal sinusitis   [place] [facility] Melvin Pao, NP

## 2024-07-07 ENCOUNTER — Ambulatory Visit: Admitting: Nurse Practitioner

## 2024-07-07 ENCOUNTER — Encounter: Payer: Self-pay | Admitting: Nurse Practitioner

## 2024-07-07 VITALS — BP 110/77 | HR 76 | Temp 97.8°F | Ht 60.98 in | Wt 177.2 lb

## 2024-07-07 DIAGNOSIS — D696 Thrombocytopenia, unspecified: Secondary | ICD-10-CM | POA: Diagnosis not present

## 2024-07-07 DIAGNOSIS — R051 Acute cough: Secondary | ICD-10-CM | POA: Diagnosis not present

## 2024-07-07 DIAGNOSIS — C73 Malignant neoplasm of thyroid gland: Secondary | ICD-10-CM

## 2024-07-07 DIAGNOSIS — E6609 Other obesity due to excess calories: Secondary | ICD-10-CM | POA: Diagnosis not present

## 2024-07-07 DIAGNOSIS — J439 Emphysema, unspecified: Secondary | ICD-10-CM

## 2024-07-07 DIAGNOSIS — Z1211 Encounter for screening for malignant neoplasm of colon: Secondary | ICD-10-CM

## 2024-07-07 DIAGNOSIS — R519 Headache, unspecified: Secondary | ICD-10-CM | POA: Diagnosis not present

## 2024-07-07 DIAGNOSIS — E66812 Obesity, class 2: Secondary | ICD-10-CM | POA: Diagnosis not present

## 2024-07-07 DIAGNOSIS — Z6836 Body mass index (BMI) 36.0-36.9, adult: Secondary | ICD-10-CM | POA: Diagnosis not present

## 2024-07-07 DIAGNOSIS — Z0000 Encounter for general adult medical examination without abnormal findings: Secondary | ICD-10-CM | POA: Diagnosis not present

## 2024-07-07 DIAGNOSIS — E7800 Pure hypercholesterolemia, unspecified: Secondary | ICD-10-CM | POA: Diagnosis not present

## 2024-07-07 DIAGNOSIS — E039 Hypothyroidism, unspecified: Secondary | ICD-10-CM

## 2024-07-07 LAB — VERITOR FLU A/B WAIVED
Influenza A: NEGATIVE
Influenza B: NEGATIVE

## 2024-07-07 MED ORDER — PREDNISONE 10 MG PO TABS: 50.0000 mg | ORAL_TABLET | Freq: Every day | ORAL | 0 refills | Status: AC

## 2024-07-07 MED ORDER — DOXYCYCLINE HYCLATE 100 MG PO TABS: 100.0000 mg | ORAL_TABLET | Freq: Two times a day (BID) | ORAL | 0 refills | Status: AC

## 2024-07-07 MED ORDER — ATORVASTATIN CALCIUM 80 MG PO TABS: 80.0000 mg | ORAL_TABLET | Freq: Every day | ORAL | 3 refills | Status: AC

## 2024-07-07 NOTE — Assessment & Plan Note (Signed)
 Chronic.  Controlled.  Continue with current medication regimen.  Labs ordered today.  Return to clinic in 6 months for reevaluation.  Call sooner if concerns arise.  ? ?

## 2024-07-07 NOTE — Assessment & Plan Note (Signed)
 Chronic.  Controlled.  Continue with current medication regimen of Levothyroxine  175mcg.  Labs ordered today.  Return to clinic in 6 months for reevaluation.  Call sooner if concerns arise.

## 2024-07-07 NOTE — Assessment & Plan Note (Signed)
 Recommended eating smaller high protein, low fat meals more frequently and exercising 30 mins a day 5 times a week with a goal of 10-15lb weight loss in the next 3 months.

## 2024-07-07 NOTE — Assessment & Plan Note (Signed)
 Chronic.  Followed by Pulmonology. Somewhat exacerbated at this time due to URI.  Continue with Bevespi .  Continue to follow up with specialist.  Follow up in 6 months.  Call sooner if concerns arise.

## 2024-07-07 NOTE — Assessment & Plan Note (Addendum)
 Chronic.  Continue with current of Levothyroxine  175mcg daily.  Labs ordered today. Follow up in 6 months.  Call sooner if concerns arise.  Also followed by Endocrinology following diagnosis.

## 2024-07-07 NOTE — Progress Notes (Signed)
 "  BP 110/77 (BP Location: Right Arm, Patient Position: Sitting, Cuff Size: Normal)   Pulse 76   Temp 97.8 F (36.6 C) (Oral)   Ht 5' 0.98 (1.549 m)   Wt 177 lb 3.2 oz (80.4 kg)   SpO2 95%   BMI 33.50 kg/m    Subjective:    Patient ID: Janet Murray, female    DOB: Nov 28, 1970, 54 y.o.   MRN: [id number]  HPI: Janet Murray is a 54 y.o. female presenting on 07/07/2024 for comprehensive medical examination. Current medical complaints include:has been sick for almost 2 weeks  She currently lives with: Menopausal Symptoms: no  COPD Using Bevspi BID.  Only using albuterol  PRN.  Working to get CT Lung screening scheduled.  Has been doing well prior to illness.  COPD status: stable Satisfied with current treatment?: yes Oxygen use: no Dyspnea frequency: not often  Cough frequency: very infrequent  Rescue inhaler frequency:  about 1-2 times per week  Limitation of activity:  does not feel limited very much  Productive cough:  Last Spirometry:  Pneumovax: unknown Influenza: Up to Date   HYPERLIPIDEMIA/ CAD  She is currently wearing a heart monitor.  States her liver enzymes were elevated and she stopped her Zetia .  Hyperlipidemia status: good compliance Satisfied with current treatment?  yes Side effects:  no Medication compliance: good compliance Past cholesterol meds: atorvastain (lipitor) Supplements: none Aspirin:  no The 10-year ASCVD risk score (Arnett DK, et al., 2019) is: 4.6%   Values used to calculate the score:     Age: 7 years     Sex: Female     Is Non-Hispanic African American: No     Diabetic: Yes     Tobacco smoker: Yes     Systolic Blood Pressure: 94 mmHg     Is BP treated: No     HDL Cholesterol: 56 mg/dL     Total Cholesterol: 220 mg/dL Chest pain:  no Coronary artery disease:  yes   HYPOTHYROIDISM Thyroid  control status:stable Satisfied with current treatment? yes Medication side effects: no Medication compliance: good compliance Etiology of  hypothyroidism:  Recent dose adjustment:no Fatigue: no Cold intolerance: no Heat intolerance: no Weight gain: yes Weight loss: no Constipation: no Diarrhea/loose stools:  no Palpitations: yes- going on for the last couple of days. Lower extremity edema: no Anxiety/depressed mood: no  UPPER RESPIRATORY TRACT INFECTION Worst symptom: symptoms have been ongoing x 2 weeks Fever: no Cough: yes Shortness of breath: yes Wheezing: yes Chest pain: yes, with cough Chest tightness: yes Chest congestion: yes Nasal congestion: yes Runny nose: yes Post nasal drip: yes Sneezing: yes Sore throat: no Swollen glands: no Sinus pressure: no Headache: yes Face pain: yes Toothache: no Ear pain: no bilateral Ear pressure: no bilateral Eyes red/itching:no Eye drainage/crusting: yes Vomiting: no Rash: no Fatigue: yes Sick contacts: no Strep contacts: no  Context: stable Recurrent sinusitis: no Relief with OTC cold/cough medications: no  Treatments attempted: none   Taking care of her mom.  She has been very ill and not doing well.   Depression Screen done today and results listed below:     07/07/2024    2:32 PM 05/10/2024    1:09 PM 04/06/2024    2:03 PM 03/09/2024   11:10 AM 01/05/2024    1:16 PM  Depression screen PHQ 2/9  Decreased Interest 1 1 1 1 1   Down, Depressed, Hopeless 0 0 0 0 0  PHQ - 2 Score 1 1 1  1  1  Altered sleeping 1 0 0 1 1  Tired, decreased energy 1 1 1 1 1   Change in appetite 0 0 0 0 0  Feeling bad or failure about yourself  0 0 0 0 0  Trouble concentrating 0 0 0 0 0  Moving slowly or fidgety/restless 0 0 0 0 0  Suicidal thoughts 0 0 0 0 0  PHQ-9 Score 3 2 2  3  3    Difficult doing work/chores Somewhat difficult Not difficult at all Somewhat difficult  Not difficult at all     Data saved with a previous flowsheet row definition    The patient does not have a history of falls. I did complete a risk assessment for falls. A plan of care for falls was  documented.   Past Medical History:  Past Medical History:  Diagnosis Date   Allergy    Anginal pain    Anxiety    Arthritis    Asthma    Biliary colic    Carpal tunnel syndrome of right wrist    Cholelithiasis    Chronic pain    Colon cancer (HCC)    COPD (chronic obstructive pulmonary disease) (HCC)    Coronary artery disease    Cyst of right kidney 03/22/2018   DDD (degenerative disc disease), lumbar    Degenerative joint disease (DJD) of hip    Diarrhea of infectious origin 03/25/2018   a.) culture (+) for campylobacter species   Dysphagia    Dyspnea    Emphysema of lung (HCC)    Facet syndrome, lumbar 10/25/2014   Fatty liver 03/25/2018   Follicular thyroid  carcinoma (HCC)    a.) stage I --> s/p total thyroidectomy   Foot fracture, left 05/02/2015   Ganglion cyst of dorsum of right wrist    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   GERD (gastroesophageal reflux disease)    Hereditary and idiopathic peripheral neuropathy 11/06/2014   HLD (hyperlipidemia)    Iatrogenic hypothyroidism    a.) s/p radioactive idodine and subsequent total thyroidectomy for stage I follicular thyroid  carcinoma   IBS (irritable bowel syndrome)    Incomplete RBBB    Long term prescription opiate use    a.) managed by PCP; has naloxone Rx available   Low back pain    Migraine 02/04/2016   Occipital neuralgia    Osteoarthritis of spine with radiculopathy, lumbar region 10/25/2014   RLS (restless legs syndrome)    a.) takes pramipexole    Sacroiliac joint dysfunction of both sides 10/25/2014   Spondylosis of lumbar spine    Stomach cancer (HCC)    Tobacco use    Tubular adenoma of colon    Urinary incontinence     Surgical History:  Past Surgical History:  Procedure Laterality Date   ABDOMINAL HYSTERECTOMY     APPENDECTOMY     BLADDER SURGERY     sling - Dr. Ferris    CESAREAN SECTION     CHOLECYSTECTOMY     COLONOSCOPY WITH PROPOFOL  N/A 04/11/2020   Procedure: COLONOSCOPY WITH  PROPOFOL ;  Surgeon: Janalyn Keene NOVAK, MD;  Location: [facility] ENDOSCOPY;  Service: Endoscopy;  Laterality: N/A;   COLONOSCOPY WITH PROPOFOL  N/A 10/22/2021   Procedure: COLONOSCOPY WITH PROPOFOL ;  Surgeon: Unk Corinn Skiff, MD;  Location: [facility] ENDOSCOPY;  Service: Gastroenterology;  Laterality: N/A;   ESOPHAGOGASTRODUODENOSCOPY  04/26/2019   INDOCYANINE GREEN  FLUORESCENCE IMAGING (ICG)  10/22/2023   Procedure: INDOCYANINE GREEN  FLUORESCENCE IMAGING (ICG);  Surgeon: Jordis Laneta FALCON, MD;  Location: [facility] ORS;  Service: General;;   STOMACH SURGERY     THYROIDECTOMY N/A 04/12/2021   Procedure: THYROIDECTOMY, total;  Surgeon: Jordis Laneta FALCON, MD;  Location: [facility] ORS;  Service: General;  Laterality: N/A;   TOTAL ABDOMINAL HYSTERECTOMY W/ BILATERAL SALPINGOOPHORECTOMY      Medications:  Current Outpatient Medications on File Prior to Visit  Medication Sig   albuterol  (PROVENTIL ) (2.5 MG/3ML) 0.083% nebulizer solution Take 3 mLs (2.5 mg total) by nebulization every 6 (six) hours as needed for wheezing or shortness of breath.   albuterol  (VENTOLIN  HFA) 108 (90 Base) MCG/ACT inhaler Inhale 2 puffs into the lungs every 4 (four) hours as needed for wheezing or shortness of breath.   AMBULATORY NON FORMULARY MEDICATION Medication Name: nebulizer DX: J44.9   BEVESPI  AEROSPHERE 9-4.8 MCG/ACT AERO USE 2 PUFFS BY MOUTH TWICE DAILY   Cholecalciferol (VITAMIN D3) 125 MCG (5000 UT) CAPS Take 1 capsule (5,000 Units total) by mouth daily.   diclofenac Sodium (VOLTAREN) 1 % GEL Apply 1 application topically daily as needed (Knee pain).   esomeprazole  (NEXIUM ) 40 MG capsule TAKE 1 CAPSULE BY MOUTH TWICE DAILY   hydrOXYzine  (VISTARIL ) 25 MG capsule Take 1 capsule (25 mg total) by mouth every 8 (eight) hours as needed.   levothyroxine  (SYNTHROID ) 175 MCG tablet TAKE 1 TABLET BY MOUTH DAILY   mirabegron  ER (MYRBETRIQ ) 50 MG TB24 tablet Take 1 tablet (50 mg total) by mouth daily. (Patient taking differently: Take 50 mg  by mouth daily as needed (overactive bladder).)   montelukast  (SINGULAIR ) 10 MG tablet Take 1 tablet (10 mg total) by mouth at bedtime.   MOUNJARO  5 MG/0.5ML Pen INJECT 5 MG INTO THE SKIN ONCE WEEKLY   naloxone (NARCAN) nasal spray 4 mg/0.1 mL Place 0.4 mg into the nose once.   ondansetron  (ZOFRAN -ODT) 4 MG disintegrating tablet TAKE 1 TABLET BY MOUTH EVERY 8 HOURS AS NEEDED   Oxycodone  HCl 10 MG TABS Take 10 mg by mouth 3 (three) times daily as needed.   pramipexole  (MIRAPEX ) 0.5 MG tablet TAKE 1 TABLET BY MOUTH 3 TIMES DAILY   tiZANidine  (ZANAFLEX ) 2 MG tablet Take 4 mg by mouth 3 (three) times daily.   No current facility-administered medications on file prior to visit.    Allergies:  Allergies[1]  Social History:  Social History   Socioeconomic History   Marital status: Single    Spouse name: Not on file   Number of children: 2   Years of education: Not on file   Highest education level: 9th grade  Occupational History   Not on file  Tobacco Use   Smoking status: Every Day    Current packs/day: 0.50    Average packs/day: 0.5 packs/day for 25.0 years (12.5 ttl pk-yrs)    Types: Cigarettes   Smokeless tobacco: Never   Tobacco comments:    0.5PPD - 01/01/2024 khj   Vaping Use   Vaping status: Never Used  Substance and Sexual Activity   Alcohol use: Not Currently    Comment: occassionally   Drug use: No   Sexual activity: Not Currently    Birth control/protection: Surgical    Comment: Hysterectomy  Other Topics Concern   Not on file  Social History Narrative   Lives at home with son and his girlfriend   Social Drivers of Health   Tobacco Use: High Risk (07/07/2024)   Patient History    Smoking Tobacco Use: Every Day    Smokeless Tobacco Use: Never    Passive Exposure: Not on  file  Financial Resource Strain: Low Risk (04/05/2024)   Overall Financial Resource Strain (CARDIA)    Difficulty of Paying Living Expenses: Not very hard  Food Insecurity: Food Insecurity  Present (04/05/2024)   Epic    Worried About Programme Researcher, Broadcasting/film/video in the Last Year: Never true    Ran Out of Food in the Last Year: Sometimes true  Transportation Needs: No Transportation Needs (04/05/2024)   Epic    Lack of Transportation (Medical): No    Lack of Transportation (Non-Medical): No  Physical Activity: Insufficiently Active (04/05/2024)   Exercise Vital Sign    Days of Exercise per Week: 4 days    Minutes of Exercise per Session: 10 min  Stress: No Stress Concern Present (04/05/2024)   [employer] of Occupational Health - Occupational Stress Questionnaire    Feeling of Stress: Not at all  Social Connections: Moderately Integrated (04/05/2024)   Social Connection and Isolation Panel    Frequency of Communication with Friends and Family: More than three times a week    Frequency of Social Gatherings with Friends and Family: More than three times a week    Attends Religious Services: More than 4 times per year    Active Member of [employer] or Organizations: Yes    Attends Banker Meetings: 1 to 4 times per year    Marital Status: Divorced  Catering Manager Violence: Not on file  Depression (PHQ2-9): Low Risk (07/07/2024)   Depression (PHQ2-9)    PHQ-2 Score: 3  Alcohol Screen: Not on file  Housing: Unknown (04/05/2024)   Epic    Unable to Pay for Housing in the Last Year: No    Number of Times Moved in the Last Year: Not on file    Homeless in the Last Year: No  Utilities: Not At Risk (03/04/2022)   [facility] Utilities    Threatened with loss of utilities: No  Health Literacy: Not on file   Tobacco Use History[2] Social History   Substance and Sexual Activity  Alcohol Use Not Currently   Comment: occassionally    Family History:  Family History  Problem Relation Age of Onset   Neuropathy Mother    Atrial fibrillation Mother    Arthritis Father    Hypertension Father    Leukemia Father    Heart disease Father        CAD s/p CABG   Healthy  Brother    Healthy Brother    Healthy Brother    Dementia Maternal Grandmother    Hypertension Maternal Grandmother    Hypercholesterolemia Maternal Grandmother    Heart disease Maternal Grandmother    Healthy Daughter    Healthy Son    Stroke Neg Hx    Cancer Neg Hx    Breast cancer Neg Hx     Past medical history, surgical history, medications, allergies, family history and social history reviewed with patient today and changes made to appropriate areas of the chart.   Review of Systems  Constitutional:  Positive for malaise/fatigue. Negative for weight loss.  HENT:  Positive for congestion and ear discharge. Negative for ear pain, sinus pain and sore throat.   Eyes:  Negative for blurred vision and double vision.  Respiratory:  Positive for cough, shortness of breath and wheezing.   Cardiovascular:  Negative for chest pain, palpitations and leg swelling.  Gastrointestinal:  Negative for constipation and diarrhea.  Neurological:  Positive for headaches. Negative for dizziness.  Psychiatric/Behavioral:  Negative for depression. The  patient is not nervous/anxious.    All other ROS negative except what is listed above and in the HPI.      Objective:    BP 110/77 (BP Location: Right Arm, Patient Position: Sitting, Cuff Size: Normal)   Pulse 76   Temp 97.8 F (36.6 C) (Oral)   Ht 5' 0.98 (1.549 m)   Wt 177 lb 3.2 oz (80.4 kg)   SpO2 95%   BMI 33.50 kg/m   Wt Readings from Last 3 Encounters:  07/07/24 177 lb 3.2 oz (80.4 kg)  05/10/24 184 lb (83.5 kg)  04/06/24 181 lb 12.8 oz (82.5 kg)    Physical Exam Vitals and nursing note reviewed.  Constitutional:      General: She is awake. She is not in acute distress.    Appearance: She is well-developed. She is not ill-appearing.  HENT:     Head: Normocephalic and atraumatic.     Right Ear: Hearing, ear canal and external ear normal. No drainage. A middle ear effusion is present.     Left Ear: Hearing, ear canal and external  ear normal. No drainage. A middle ear effusion is present.     Nose: Congestion and rhinorrhea present.     Right Sinus: No maxillary sinus tenderness or frontal sinus tenderness.     Left Sinus: No maxillary sinus tenderness or frontal sinus tenderness.     Mouth/Throat:     Mouth: Mucous membranes are moist.     Pharynx: Oropharynx is clear. Uvula midline. Posterior oropharyngeal erythema present. No pharyngeal swelling or oropharyngeal exudate.  Eyes:     General: Lids are normal.        Right eye: No discharge.        Left eye: Discharge present.    Extraocular Movements: Extraocular movements intact.     Conjunctiva/sclera: Conjunctivae normal.     Pupils: Pupils are equal, round, and reactive to light.     Visual Fields: Right eye visual fields normal and left eye visual fields normal.  Neck:     Thyroid : No thyromegaly.     Vascular: No carotid bruit.     Trachea: Trachea normal.  Cardiovascular:     Rate and Rhythm: Normal rate and regular rhythm.     Heart sounds: Normal heart sounds. No murmur heard.    No gallop.  Pulmonary:     Effort: Pulmonary effort is normal. No accessory muscle usage or respiratory distress.     Breath sounds: Wheezing present.  Chest:  Breasts:    Right: Normal.     Left: Normal.  Abdominal:     General: Bowel sounds are normal.     Palpations: Abdomen is soft. There is no hepatomegaly or splenomegaly.     Tenderness: There is no abdominal tenderness.  Musculoskeletal:        General: Normal range of motion.     Cervical back: Normal range of motion and neck supple.     Right lower leg: No edema.     Left lower leg: No edema.  Lymphadenopathy:     Head:     Right side of head: No submental, submandibular, tonsillar, preauricular or posterior auricular adenopathy.     Left side of head: No submental, submandibular, tonsillar, preauricular or posterior auricular adenopathy.     Cervical: No cervical adenopathy.     Upper Body:     Right  upper body: No supraclavicular, axillary or pectoral adenopathy.     Left upper body: No  supraclavicular, axillary or pectoral adenopathy.  Skin:    General: Skin is warm and dry.     Capillary Refill: Capillary refill takes less than 2 seconds.     Findings: No rash.  Neurological:     Mental Status: She is alert and oriented to person, place, and time.     Gait: Gait is intact.  Psychiatric:        Attention and Perception: Attention normal.        Mood and Affect: Mood normal.        Speech: Speech normal.        Behavior: Behavior normal. Behavior is cooperative.        Thought Content: Thought content normal.        Judgment: Judgment normal.     Results for orders placed or performed in visit on 01/26/24  ECHOCARDIOGRAM COMPLETE   Collection Time: 01/26/24  2:55 PM  Result Value Ref Range   AR max vel 1.73 cm2   AV Peak grad 8.4 mmHg   Ao pk vel 1.45 m/s   S' Lateral 3.70 cm   Area-P 1/2 3.79 cm2   AV Area VTI 1.60 cm2   AV Mean grad 4.0 mmHg   AV Area mean vel 1.58 cm2   MV VTI 1.64 cm2   Est EF 55 - 60%       Assessment & Plan:   Problem List Items Addressed This Visit       Respiratory   COPD (chronic obstructive pulmonary disease) with emphysema (HCC)   Chronic.  Followed by Pulmonology. Somewhat exacerbated at this time due to URI.  Continue with Bevespi .  Continue to follow up with specialist.  Follow up in 6 months.  Call sooner if concerns arise.       Relevant Medications   predniSONE  (DELTASONE ) 10 MG tablet   Other Relevant Orders   Comprehensive metabolic panel with GFR     Endocrine   Acquired hypothyroidism   Chronic.  Controlled.  Continue with current medication regimen of Levothyroxine  175mcg.  Labs ordered today.  Return to clinic in 6 months for reevaluation.  Call sooner if concerns arise.        Relevant Orders   TSH   T4   Follicular cancer of thyroid  (HCC)   Chronic.  Continue with current of Levothyroxine  175mcg daily.  Labs  ordered today. Follow up in 6 months.  Call sooner if concerns arise.  Also followed by Endocrinology following diagnosis.       Relevant Medications   predniSONE  (DELTASONE ) 10 MG tablet   doxycycline  (VIBRA -TABS) 100 MG tablet   Other Relevant Orders   Comprehensive metabolic panel with GFR     Hematopoietic and Hemostatic   Thrombocytopenia   Chronic.  Controlled.  Continue with current medication regimen.  Labs ordered today.  Return to clinic in 6 months for reevaluation.  Call sooner if concerns arise.        Relevant Orders   CBC with Differential/Platelet     Other   Obesity   Recommended eating smaller high protein, low fat meals more frequently and exercising 30 mins a day 5 times a week with a goal of 10-15lb weight loss in the next 3 months.       High cholesterol   Chronic.  Controlled.  Continue with current medication regimen.  Labs ordered today.  Return to clinic in 6 months for reevaluation.  Call sooner if concerns arise.  Relevant Medications   atorvastatin  (LIPITOR) 80 MG tablet   Other Relevant Orders   Lipid panel   Other Visit Diagnoses       Annual physical exam    -  Primary   Health maintenance reviewed during visit today.  Labs ordered.  Vaccines reviewed.  PAP up to date.  Mammogram up to date.     Acute cough       Flu and COVID neg. Will treat with Doxycyline and Prednisone . Complete course of medication.  Follow up if not improved.   Relevant Orders   Veritor Flu A/B Waived     Acute nonintractable headache, unspecified headache type       Relevant Orders   POC COVID-19     Screening for colon cancer       Relevant Orders   Ambulatory referral to Gastroenterology        Follow up plan: Return in about 6 months (around 01/04/2025) for HTN, HLD, DM2 FU.   LABORATORY TESTING:  - Pap smear: NA  IMMUNIZATIONS:   - Tdap: Tetanus vaccination status reviewed: Medicare. - Influenza: Refused - Pneumovax: Refused - Prevnar:  Refused - COVID: Refused - HPV: Not applicable - Shingrix vaccine: Refused  SCREENING: -Mammogram: Up to date  - Colonoscopy: Ordered today  - Bone Density: Not applicable  -Hearing Test: Not applicable  -Spirometry: Not applicable   PATIENT COUNSELING:   Advised to take 1 mg of folate supplement per day if capable of pregnancy.   Sexuality: Discussed sexually transmitted diseases, partner selection, use of condoms, avoidance of unintended pregnancy  and contraceptive alternatives.   Advised to avoid cigarette smoking.  I discussed with the patient that most people either abstain from alcohol or drink within safe limits (<=14/week and <=4 drinks/occasion for males, <=7/weeks and <= 3 drinks/occasion for females) and that the risk for alcohol disorders and other health effects rises proportionally with the number of drinks per week and how often a drinker exceeds daily limits.  Discussed cessation/primary prevention of drug use and availability of treatment for abuse.   Diet: Encouraged to adjust caloric intake to maintain  or achieve ideal body weight, to reduce intake of dietary saturated fat and total fat, to limit sodium intake by avoiding high sodium foods and not adding table salt, and to maintain adequate dietary potassium and calcium  preferably from fresh fruits, vegetables, and low-fat dairy products.    stressed the importance of regular exercise  Injury prevention: Discussed safety belts, safety helmets, smoke detector, smoking near bedding or upholstery.   Dental health: Discussed importance of regular tooth brushing, flossing, and dental visits.    NEXT PREVENTATIVE PHYSICAL DUE IN 1 YEAR. Return in about 6 months (around 01/04/2025) for HTN, HLD, DM2 FU.             [1]  Allergies Allergen Reactions   Penicillins Hives and Swelling    Has tolerated multiple 1st & 3rd generation (CEFAZOLIN , CEPHALEXIN , CEFTRIAXONE , CEFDINIR ) cephalosporins in the past with  no documented ADRs.    Acetaminophen      Due to polyps    Ciprofloxacin  Itching   Aspirin Palpitations   Hydrocodone  Hives and Rash   Tape Rash    Per patient clear tape  [2]  Social History Tobacco Use  Smoking Status Every Day   Current packs/day: 0.50   Average packs/day: 0.5 packs/day for 25.0 years (12.5 ttl pk-yrs)   Types: Cigarettes  Smokeless Tobacco Never  Tobacco Comments   0.5PPD -  01/01/2024 khj    "

## 2024-07-08 ENCOUNTER — Ambulatory Visit: Payer: Self-pay | Admitting: Nurse Practitioner

## 2024-07-08 LAB — CBC WITH DIFFERENTIAL/PLATELET
Basophils Absolute: 0.1 10*3/uL (ref 0.0–0.2)
Basos: 1 %
EOS (ABSOLUTE): 0.2 10*3/uL (ref 0.0–0.4)
Eos: 3 %
Hematocrit: 42.2 % (ref 34.0–46.6)
Hemoglobin: 14.7 g/dL (ref 11.1–15.9)
Immature Grans (Abs): 0 10*3/uL (ref 0.0–0.1)
Immature Granulocytes: 0 %
Lymphocytes Absolute: 2.1 10*3/uL (ref 0.7–3.1)
Lymphs: 38 %
MCH: 35.3 pg — ABNORMAL HIGH (ref 26.6–33.0)
MCHC: 34.8 g/dL (ref 31.5–35.7)
MCV: 101 fL — ABNORMAL HIGH (ref 79–97)
Monocytes Absolute: 0.4 10*3/uL (ref 0.1–0.9)
Monocytes: 8 %
Neutrophils Absolute: 2.8 10*3/uL (ref 1.4–7.0)
Neutrophils: 50 %
RBC: 4.17 x10E6/uL (ref 3.77–5.28)
RDW: 11.4 % — ABNORMAL LOW (ref 11.7–15.4)
WBC: 5.7 10*3/uL (ref 3.4–10.8)

## 2024-07-08 LAB — COMPREHENSIVE METABOLIC PANEL WITH GFR
ALT: 16 [IU]/L (ref 0–32)
AST: 23 [IU]/L (ref 0–40)
Albumin: 4.3 g/dL (ref 3.8–4.9)
Alkaline Phosphatase: 97 [IU]/L (ref 49–135)
BUN/Creatinine Ratio: 12 (ref 9–23)
BUN: 7 mg/dL (ref 6–24)
Bilirubin Total: 0.5 mg/dL (ref 0.0–1.2)
CO2: 24 mmol/L (ref 20–29)
Calcium: 9.7 mg/dL (ref 8.7–10.2)
Chloride: 102 mmol/L (ref 96–106)
Creatinine, Ser: 0.6 mg/dL (ref 0.57–1.00)
Globulin, Total: 2 g/dL (ref 1.5–4.5)
Glucose: 104 mg/dL — ABNORMAL HIGH (ref 70–99)
Potassium: 4.1 mmol/L (ref 3.5–5.2)
Sodium: 140 mmol/L (ref 134–144)
Total Protein: 6.3 g/dL (ref 6.0–8.5)
eGFR: 107 mL/min/{1.73_m2}

## 2024-07-08 LAB — LIPID PANEL
Chol/HDL Ratio: 2.9 ratio (ref 0.0–4.4)
Cholesterol, Total: 154 mg/dL (ref 100–199)
HDL: 54 mg/dL
LDL Chol Calc (NIH): 81 mg/dL (ref 0–99)
Triglycerides: 104 mg/dL (ref 0–149)
VLDL Cholesterol Cal: 19 mg/dL (ref 5–40)

## 2024-07-08 LAB — TSH: TSH: 7.34 u[IU]/mL — ABNORMAL HIGH (ref 0.450–4.500)

## 2024-07-08 LAB — T4: T4, Total: 7.2 ug/dL (ref 4.5–12.0)

## 2024-07-08 LAB — POC COVID19 BINAXNOW: SARS Coronavirus 2 Ag: NEGATIVE

## 2024-07-11 ENCOUNTER — Other Ambulatory Visit: Payer: Self-pay

## 2024-07-11 ENCOUNTER — Telehealth: Payer: Self-pay

## 2024-07-11 DIAGNOSIS — Z860100 Personal history of colon polyps, unspecified: Secondary | ICD-10-CM

## 2024-07-11 NOTE — Telephone Encounter (Signed)
 Gastroenterology Pre-Procedure Review  Request Date: 10/24/24 Requesting Physician: Dr. Theophilus  PATIENT REVIEW QUESTIONS: The patient responded to the following health history questions as indicated:    1. Are you having any GI issues? no 2. 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 yes (last colonoscopy performed by Dr. Unk 10/22/21 recommended repeat in 3 years) 3. Do you have a family history of Colon Cancer or Polyps? no 4. Diabetes Mellitus? Not a diabetic but takes Mounjaro .  Noted on instructions to stop 7 days prior 5. Joint replacements in the past 12 months?no 6. Major health problems in the past 3 months?no 7. Any artificial heart valves, MVP, or defibrillator?no 8. Cardiac history and pulmonary history.  Clearances sent to both physicians.    MEDICATIONS & ALLERGIES:    Patient reports the following regarding taking any anticoagulation/antiplatelet therapy:   Plavix, Coumadin, Eliquis, Xarelto, Lovenox , Pradaxa, Brilinta, or Effient? no Aspirin? no  Patient confirms/reports the following medications:  Current Outpatient Medications  Medication Sig Dispense Refill   albuterol  (PROVENTIL ) (2.5 MG/3ML) 0.083% nebulizer solution Take 3 mLs (2.5 mg total) by nebulization every 6 (six) hours as needed for wheezing or shortness of breath. 75 mL 12   albuterol  (VENTOLIN  HFA) 108 (90 Base) MCG/ACT inhaler Inhale 2 puffs into the lungs every 4 (four) hours as needed for wheezing or shortness of breath. 1 each 3   AMBULATORY NON FORMULARY MEDICATION Medication Name: nebulizer DX: J44.9 1 each 0   atorvastatin  (LIPITOR) 80 MG tablet Take 1 tablet (80 mg total) by mouth daily. 90 tablet 3   BEVESPI  AEROSPHERE 9-4.8 MCG/ACT AERO USE 2 PUFFS BY MOUTH TWICE DAILY 10.7 g 3   Cholecalciferol (VITAMIN D3) 125 MCG (5000 UT) CAPS Take 1 capsule (5,000 Units total) by mouth daily. 90 capsule 1   diclofenac Sodium (VOLTAREN) 1 % GEL Apply 1 application topically daily as needed (Knee pain).      doxycycline  (VIBRA -TABS) 100 MG tablet Take 1 tablet (100 mg total) by mouth 2 (two) times daily. 20 tablet 0   esomeprazole  (NEXIUM ) 40 MG capsule TAKE 1 CAPSULE BY MOUTH TWICE DAILY 180 capsule 1   hydrOXYzine  (VISTARIL ) 25 MG capsule Take 1 capsule (25 mg total) by mouth every 8 (eight) hours as needed. 30 capsule 0   levothyroxine  (SYNTHROID ) 175 MCG tablet TAKE 1 TABLET BY MOUTH DAILY 90 tablet 0   mirabegron  ER (MYRBETRIQ ) 50 MG TB24 tablet Take 1 tablet (50 mg total) by mouth daily. (Patient taking differently: Take 50 mg by mouth daily as needed (overactive bladder).) 30 tablet 5   montelukast  (SINGULAIR ) 10 MG tablet Take 1 tablet (10 mg total) by mouth at bedtime. 90 tablet 1   MOUNJARO  5 MG/0.5ML Pen INJECT 5 MG INTO THE SKIN ONCE WEEKLY 6 mL 1   naloxone (NARCAN) nasal spray 4 mg/0.1 mL Place 0.4 mg into the nose once.     ondansetron  (ZOFRAN -ODT) 4 MG disintegrating tablet TAKE 1 TABLET BY MOUTH EVERY 8 HOURS AS NEEDED 30 tablet 0   Oxycodone  HCl 10 MG TABS Take 10 mg by mouth 3 (three) times daily as needed.     pramipexole  (MIRAPEX ) 0.5 MG tablet TAKE 1 TABLET BY MOUTH 3 TIMES DAILY 270 tablet 0   predniSONE  (DELTASONE ) 10 MG tablet Take 5 tablets (50 mg total) by mouth daily with breakfast. 25 tablet 0   tiZANidine  (ZANAFLEX ) 2 MG tablet Take 4 mg by mouth 3 (three) times daily.     No current facility-administered  medications for this visit.    Patient confirms/reports the following allergies:  Allergies[1]  No orders of the defined types were placed in this encounter.   AUTHORIZATION INFORMATION Primary Insurance: 1D#: Group #:  Secondary Insurance: 1D#: Group #:  SCHEDULE INFORMATION: Date:  Time: Location:     [1]  Allergies Allergen Reactions   Penicillins Hives and Swelling    Has tolerated multiple 1st & 3rd generation (CEFAZOLIN , CEPHALEXIN , CEFTRIAXONE , CEFDINIR ) cephalosporins in the past with no documented ADRs.    Acetaminophen      Due to  polyps    Ciprofloxacin  Itching   Aspirin Palpitations   Hydrocodone  Hives and Rash   Tape Rash    Per patient clear tape

## 2024-07-12 ENCOUNTER — Telehealth (HOSPITAL_BASED_OUTPATIENT_CLINIC_OR_DEPARTMENT_OTHER): Payer: Self-pay

## 2024-07-12 NOTE — Telephone Encounter (Signed)
 Patient has an upcoming appointment clearance can be addressed at ov and note has been made to appointment line that preop clearance is needed

## 2024-07-12 NOTE — Telephone Encounter (Signed)
"  ° °  Pre-operative Risk Assessment    Patient Name: Janet Murray  DOB: [birth year] MRN: [id number]   Date of last office visit: 12/22/2023 with Dayna Dunn, PA-C Date of next office visit: 07/29/2024 with Dr. Darliss  Request for Surgical Clearance    Procedure:  Colonoscopy  Date of Surgery:  Clearance 10/24/24                                 Surgeon:  Dr. Theophilus Socks Group or Practice Name:  [facility]  Phone number:  [phone] or [phone] - Michelle's desk Fax number:  [phone]   Type of Clearance Requested:   - Medical  - Pharmacy:  Hold Mounjaro  for 7 days      Type of Anesthesia:  General    Additional requests/questions:  None  SignedPatrcia Iverson CROME   07/12/2024, 11:22 AM   "

## 2024-07-29 ENCOUNTER — Ambulatory Visit: Admitting: Cardiology

## 2024-10-24 ENCOUNTER — Ambulatory Visit: Admit: 2024-10-24

## 2025-01-09 ENCOUNTER — Ambulatory Visit: Admitting: Nurse Practitioner
